# Patient Record
Sex: Female | Born: 1950
Health system: Southern US, Community
[De-identification: ages and names within clinical notes are randomized; demographics above are authoritative.]

## PROBLEM LIST (undated history)

## (undated) DIAGNOSIS — I739 Peripheral vascular disease, unspecified: Secondary | ICD-10-CM

## (undated) DIAGNOSIS — F32A Depression, unspecified: Secondary | ICD-10-CM

## (undated) DIAGNOSIS — G8929 Other chronic pain: Secondary | ICD-10-CM

## (undated) DIAGNOSIS — F1910 Other psychoactive substance abuse, uncomplicated: Secondary | ICD-10-CM

## (undated) DIAGNOSIS — Z8619 Personal history of other infectious and parasitic diseases: Secondary | ICD-10-CM

## (undated) DIAGNOSIS — F410 Panic disorder [episodic paroxysmal anxiety] without agoraphobia: Secondary | ICD-10-CM

## (undated) DIAGNOSIS — N390 Urinary tract infection, site not specified: Secondary | ICD-10-CM

## (undated) DIAGNOSIS — M1990 Unspecified osteoarthritis, unspecified site: Secondary | ICD-10-CM

## (undated) DIAGNOSIS — E119 Type 2 diabetes mellitus without complications: Secondary | ICD-10-CM

## (undated) DIAGNOSIS — F329 Major depressive disorder, single episode, unspecified: Secondary | ICD-10-CM

## (undated) DIAGNOSIS — F419 Anxiety disorder, unspecified: Secondary | ICD-10-CM

## (undated) DIAGNOSIS — I10 Essential (primary) hypertension: Secondary | ICD-10-CM

## (undated) DIAGNOSIS — K759 Inflammatory liver disease, unspecified: Secondary | ICD-10-CM

## (undated) DIAGNOSIS — R7303 Prediabetes: Secondary | ICD-10-CM

## (undated) HISTORY — DX: Urinary tract infection, site not specified: N39.0

## (undated) HISTORY — PX: KNEE SURGERY: SHX244

## (undated) HISTORY — PX: INTRACAPSULAR CATARACT EXTRACTION: SHX361

## (undated) HISTORY — DX: Major depressive disorder, single episode, unspecified: F32.9

## (undated) HISTORY — DX: Unspecified osteoarthritis, unspecified site: M19.90

## (undated) HISTORY — DX: Personal history of other infectious and parasitic diseases: Z86.19

## (undated) HISTORY — DX: Depression, unspecified: F32.A

---

## 1969-07-09 HISTORY — PX: TONSILLECTOMY AND ADENOIDECTOMY: SUR1326

## 1985-07-09 HISTORY — PX: CHOLECYSTECTOMY: SHX55

## 1987-07-10 HISTORY — PX: ABDOMINAL HYSTERECTOMY: SHX81

## 2004-07-25 ENCOUNTER — Ambulatory Visit: Payer: Self-pay

## 2004-10-24 ENCOUNTER — Ambulatory Visit: Payer: Self-pay | Admitting: Family Medicine

## 2005-02-20 ENCOUNTER — Ambulatory Visit: Payer: Self-pay | Admitting: Family Medicine

## 2008-02-14 ENCOUNTER — Emergency Department: Payer: Self-pay | Admitting: Emergency Medicine

## 2008-02-17 ENCOUNTER — Ambulatory Visit: Payer: Self-pay | Admitting: Physician Assistant

## 2008-03-01 ENCOUNTER — Emergency Department: Payer: Self-pay | Admitting: Emergency Medicine

## 2008-03-01 ENCOUNTER — Other Ambulatory Visit: Payer: Self-pay

## 2010-03-22 ENCOUNTER — Ambulatory Visit: Payer: Self-pay | Admitting: Ophthalmology

## 2012-08-25 DIAGNOSIS — F32A Depression, unspecified: Secondary | ICD-10-CM | POA: Insufficient documentation

## 2012-08-25 DIAGNOSIS — F419 Anxiety disorder, unspecified: Secondary | ICD-10-CM | POA: Insufficient documentation

## 2014-10-27 ENCOUNTER — Ambulatory Visit: Payer: Self-pay | Admitting: Nurse Practitioner

## 2015-01-19 ENCOUNTER — Emergency Department
Admission: EM | Admit: 2015-01-19 | Discharge: 2015-01-19 | Disposition: A | Discharge status: Home / Self Care | Payer: Self-pay | Attending: Emergency Medicine | Admitting: Emergency Medicine

## 2015-01-19 ENCOUNTER — Emergency Department: Payer: Self-pay

## 2015-01-19 ENCOUNTER — Encounter: Payer: Self-pay | Admitting: Emergency Medicine

## 2015-01-19 DIAGNOSIS — Z720 Tobacco use: Secondary | ICD-10-CM | POA: Insufficient documentation

## 2015-01-19 DIAGNOSIS — L84 Corns and callosities: Secondary | ICD-10-CM | POA: Insufficient documentation

## 2015-01-19 HISTORY — DX: Other psychoactive substance abuse, uncomplicated: F19.10

## 2015-01-19 HISTORY — DX: Other chronic pain: G89.29

## 2015-01-19 LAB — GLUCOSE, CAPILLARY: Glucose-Capillary: 207 mg/dL — ABNORMAL HIGH (ref 65–99)

## 2015-01-19 MED ORDER — SULFAMETHOXAZOLE-TRIMETHOPRIM 800-160 MG PO TABS
ORAL_TABLET | ORAL | Status: AC
  Administered 2015-01-19: 1 via ORAL
  Filled 2015-01-19: qty 1

## 2015-01-19 MED ORDER — SULFAMETHOXAZOLE-TRIMETHOPRIM 800-160 MG PO TABS: 1.0000 | ORAL_TABLET | Freq: Two times a day (BID) | ORAL | Status: DC

## 2015-01-19 MED ORDER — TRAMADOL HCL 50 MG PO TABS: 50.0000 mg | ORAL_TABLET | Freq: Four times a day (QID) | ORAL | Status: DC | PRN

## 2015-01-19 MED ORDER — OXYCODONE-ACETAMINOPHEN 5-325 MG PO TABS
2.0000 | ORAL_TABLET | Freq: Once | ORAL | Status: AC
  Administered 2015-01-19: 2 via ORAL

## 2015-01-19 MED ORDER — SULFAMETHOXAZOLE-TRIMETHOPRIM 800-160 MG PO TABS
1.0000 | ORAL_TABLET | Freq: Once | ORAL | Status: AC
  Administered 2015-01-19: 1 via ORAL

## 2015-01-19 MED ORDER — OXYCODONE-ACETAMINOPHEN 5-325 MG PO TABS
ORAL_TABLET | ORAL | Status: AC
  Administered 2015-01-19: 2 via ORAL
  Filled 2015-01-19: qty 2

## 2015-01-19 NOTE — ED Notes (Signed)
Pt states numbness to her left foot for 1 month, pt has large blister on her left big toe

## 2015-01-19 NOTE — Discharge Instructions (Signed)
Corns and Calluses Corns are small areas of thickened skin that usually occur on the top, sides, or tip of a toe. They contain a cone-shaped core with a point that can press on a nerve below. This causes pain. Calluses are areas of thickened skin that usually develop on hands, fingers, palms, soles of the feet, and heels. These are areas that experience frequent friction or pressure. CAUSES  Corns are usually the result of rubbing (friction) or pressure from shoes that are too tight or do not fit properly. Calluses are caused by repeated friction and pressure on the affected areas. SYMPTOMS  A hard growth on the skin.  Pain or tenderness under the skin.  Sometimes, redness and swelling.  Increased discomfort while wearing tight-fitting shoes. DIAGNOSIS  Your caregiver can usually tell what the problem is by doing a physical exam. TREATMENT  Removing the cause of the friction or pressure is usually the only treatment needed. However, sometimes medicines can be used to help soften the hardened, thickened areas. These medicines include salicylic acid plasters and 12% ammonium lactate lotion. These medicines should only be used under the direction of your caregiver. HOME CARE INSTRUCTIONS   Try to remove pressure from the affected area.  You may wear donut-shaped corn pads to protect your skin.  You may use a pumice stone or nonmetallic nail file to gently reduce the thickness of a corn.  Wear properly fitted footwear.  If you have calluses on the hands, wear gloves during activities that cause friction.  If you have diabetes, you should regularly examine your feet. Tell your caregiver if you notice any problems with your feet. SEEK IMMEDIATE MEDICAL CARE IF:   You have increased pain, swelling, redness, or warmth in the affected area.  Your corn or callus starts to drain fluid or bleeds.  You are not getting better, even with treatment. Document Released: 03/31/2004 Document  Revised: 09/17/2011 Document Reviewed: 02/20/2011 [facility] Patient Information 2015 [place], [place]. This information is not intended to replace advice given to you by your health care provider. Make sure you discuss any questions you have with your health care provider. Cellulitis Cellulitis is an infection of the skin and the tissue beneath it. The infected area is usually red and tender. Cellulitis occurs most often in the arms and lower legs.  CAUSES  Cellulitis is caused by bacteria that enter the skin through cracks or cuts in the skin. The most common types of bacteria that cause cellulitis are staphylococci and streptococci. SIGNS AND SYMPTOMS   Redness and warmth.  Swelling.  Tenderness or pain.  Fever. DIAGNOSIS  Your health care provider can usually determine what is wrong based on a physical exam. Blood tests may also be done. TREATMENT  Treatment usually involves taking an antibiotic medicine. HOME CARE INSTRUCTIONS   Take your antibiotic medicine as directed by your health care provider. Finish the antibiotic even if you start to feel better.  Keep the infected arm or leg elevated to reduce swelling.  Apply a warm cloth to the affected area up to 4 times per day to relieve pain.  Take medicines only as directed by your health care provider.  Keep all follow-up visits as directed by your health care provider. SEEK MEDICAL CARE IF:   You notice red streaks coming from the infected area.  Your red area gets larger or turns dark in color.  Your bone or joint underneath the infected area becomes painful after the skin has healed.  Your  infection returns in the same area or another area.  You notice a swollen bump in the infected area.  You develop new symptoms.  You have a fever. SEEK IMMEDIATE MEDICAL CARE IF:   You feel very sleepy.  You develop vomiting or diarrhea.  You have a general ill feeling (malaise) with muscle aches and pains. MAKE SURE YOU:     Understand these instructions.  Will watch your condition.  Will get help right away if you are not doing well or get worse. Document Released: 04/04/2005 Document Revised: 11/09/2013 Document Reviewed: 09/10/2011 [facility] Patient Information 2015 [place], [place]. This information is not intended to replace advice given to you by your health care provider. Make sure you discuss any questions you have with your health care provider.  Specify anatomic site  Laterality: --Right --Left --Bilateral  Cellulitis of other parts of limb, specify: --Right --Left --Upper --Lower  Cellulitis of the cheek, specify: --Internal --External  Document any associated diagnoses/conditions  Supporting Information:   Thank You,

## 2015-01-19 NOTE — ED Notes (Signed)
Pt presents with left foot pain and blisters with some numbness going on for about one year. Pt does not know if she is diabetic or not.

## 2015-01-19 NOTE — ED Provider Notes (Addendum)
[facility] Emergency Department Provider Note     Time seen: ----------------------------------------- 4:14 PM on 01/19/2015 -----------------------------------------    I have reviewed the triage vital signs and the nursing notes.   HISTORY  Chief Complaint Numbness    HPI Yesenia Shepard is a 64 y.o. female who presents ER for numbness to her left foot for a month, Main complaint however is a large blister to the medial and inferior aspect of her left great toe. Patient does report some pain there that may be due to the numbness that she's been experiencing. Pain is mild to moderate worse with ambulation.   Past Medical History  Diagnosis Date  . Chronic pain   . Drug abuse     There are no active problems to display for this patient.   Past Surgical History  Procedure Laterality Date  . Abdominal surgery    . Cholecystectomy    . Tonsillectomy    . Abdominal hysterectomy      Allergies Codeine  Social History History  Substance Use Topics  . Smoking status: Current Some Day Smoker  . Smokeless tobacco: Not on file  . Alcohol Use: No    Review of Systems Constitutional: Negative for fever. Eyes: Negative for visual changes. ENT: Negative for sore throat. Cardiovascular: Negative for chest pain. Respiratory: Negative for shortness of breath. Gastrointestinal: Negative for abdominal pain, vomiting and diarrhea. Musculoskeletal: Negative for back pain. Skin: Negative for rash. Neurological: Negative for headaches, positive for paresthesias in bilateral feet  10-point ROS otherwise negative.  ____________________________________________   PHYSICAL EXAM:  VITAL SIGNS: ED Triage Vitals  Enc Vitals Group     BP 01/19/15 1444 151/94 mmHg     Pulse Rate 01/19/15 1444 81     Resp 01/19/15 1444 18     Temp 01/19/15 1444 98.6 F (37 C)     Temp Source 01/19/15 1444 Oral     SpO2 01/19/15 1444 93 %     Weight 01/19/15 1444 160  lb (72.576 kg)     Height 01/19/15 1444 5\' 6"  (1.676 m)     Head Cir --      Peak Flow --      Pain Score 01/19/15 1444 10     Pain Loc --      Pain Edu? --      Excl. in [place]? --     Constitutional: Alert and oriented. Well appearing and in no distress. Eyes: Conjunctivae are normal. PERRL. Normal extraocular movements. Musculoskeletal: Nontender with normal range of motion in all extremities. No joint effusions.  No lower extremity tenderness nor edema. See toe exam is dictated under skin. Neurologic:  Normal speech and language. No gross focal neurologic deficits are appreciated. Speech is normal. No gait instability. Skin:  It appears to be corn formation on the base of the left great toe as well as surrounding contusion and possibly infection. Area is tender to touch, normal range of motion. Psychiatric: Mood and affect are normal. Speech and behavior are normal. Patient exhibits appropriate insight and judgment.   ____________________________________________  ED COURSE:  Pertinent labs & imaging results that were available during my care of the patient were reviewed by me and considered in my medical decision making (see chart for details). Patient will need imaging of the left great toe, will get a fingerstick. ____________________________________________   RADIOLOGY Images were viewed by me  Left great toe x-rays unremarkable   Blood sugar was 207, should be advised  for repeat of this by her primary care doctor. ____________________________________________  FINAL ASSESSMENT AND PLAN  Callus formation and cellulitis  Plan: Patient with extensive callous formation the base of the left great toe, there does appear to be some infection present. She'll be discharged with Septra as well as pain medication and podiatry referral.   Earleen Newport, MD   Earleen Newport, MD 01/19/15 [place], MD 01/19/15 [phone]

## 2015-01-31 ENCOUNTER — Ambulatory Visit: Payer: Self-pay | Admitting: Nurse Practitioner

## 2015-01-31 DIAGNOSIS — Z0289 Encounter for other administrative examinations: Secondary | ICD-10-CM

## 2015-02-04 LAB — CBC AND DIFFERENTIAL
HCT: 41 % (ref 36–46)
Hemoglobin: 13.7 g/dL (ref 12.0–16.0)
Neutrophils Absolute: 4 /uL
Platelets: 244 10*3/uL (ref 150–399)
WBC: 6.4 10^3/mL

## 2015-02-04 LAB — BASIC METABOLIC PANEL
BUN: 10 mg/dL (ref 4–21)
Creatinine: 0.6 mg/dL (ref 0.5–1.1)
Glucose: 155 mg/dL
Potassium: 4.6 mmol/L (ref 3.4–5.3)
Sodium: 139 mmol/L (ref 137–147)

## 2015-02-04 LAB — HEPATIC FUNCTION PANEL
ALT: 21 U/L (ref 7–35)
AST: 23 U/L (ref 13–35)
Alkaline Phosphatase: 65 U/L (ref 25–125)
Bilirubin, Total: 0.4 mg/dL

## 2015-02-04 LAB — LIPID PANEL
Cholesterol: 158 mg/dL (ref 0–200)
HDL: 42 mg/dL (ref 35–70)
LDL Cholesterol: 96 mg/dL
Triglycerides: 99 mg/dL (ref 40–160)

## 2015-02-04 LAB — TSH: TSH: 0.5 u[IU]/mL (ref 0.41–5.90)

## 2015-03-15 ENCOUNTER — Encounter: Payer: Self-pay | Admitting: Family Medicine

## 2015-03-15 ENCOUNTER — Ambulatory Visit (INDEPENDENT_AMBULATORY_CARE_PROVIDER_SITE_OTHER): Payer: Self-pay | Admitting: Family Medicine

## 2015-03-15 VITALS — BP 124/80 | HR 70 | Temp 98.1°F | Ht 65.5 in | Wt 151.4 lb

## 2015-03-15 DIAGNOSIS — R739 Hyperglycemia, unspecified: Secondary | ICD-10-CM

## 2015-03-15 DIAGNOSIS — R7309 Other abnormal glucose: Secondary | ICD-10-CM

## 2015-03-15 DIAGNOSIS — L97921 Non-pressure chronic ulcer of unspecified part of left lower leg limited to breakdown of skin: Secondary | ICD-10-CM | POA: Insufficient documentation

## 2015-03-15 DIAGNOSIS — F1991 Other psychoactive substance use, unspecified, in remission: Secondary | ICD-10-CM | POA: Insufficient documentation

## 2015-03-15 DIAGNOSIS — M1990 Unspecified osteoarthritis, unspecified site: Secondary | ICD-10-CM | POA: Insufficient documentation

## 2015-03-15 DIAGNOSIS — S81802D Unspecified open wound, left lower leg, subsequent encounter: Secondary | ICD-10-CM

## 2015-03-15 DIAGNOSIS — Z87898 Personal history of other specified conditions: Secondary | ICD-10-CM

## 2015-03-15 DIAGNOSIS — Z0000 Encounter for general adult medical examination without abnormal findings: Secondary | ICD-10-CM

## 2015-03-15 DIAGNOSIS — F1920 Other psychoactive substance dependence, uncomplicated: Secondary | ICD-10-CM

## 2015-03-15 DIAGNOSIS — Z13220 Encounter for screening for lipoid disorders: Secondary | ICD-10-CM

## 2015-03-15 DIAGNOSIS — F17200 Nicotine dependence, unspecified, uncomplicated: Secondary | ICD-10-CM | POA: Insufficient documentation

## 2015-03-15 NOTE — Assessment & Plan Note (Signed)
Patient currently on Xanax, methadone, and Klonopin. Given history of abuse I will not be prescribing these medications for her as she is at high risk and current guidelines do not advised simultaneous use of these meds.

## 2015-03-15 NOTE — Progress Notes (Signed)
Subjective:  Patient ID: Yesenia Shepard, female    DOB: 12-01-1950  Age: 64 y.o. MRN: [id number]  CC: Establish care  HPI Yesenia Shepard is a 64 y.o. female presents to the clinic today to establish care.  Preventative Healthcare  Pap smear: Last year  Mammogram/Breast exam: Approximately 6 months ago.  Colonoscopy: Patient has never had a colonoscopy. Patient declined today. She states that she is waiting on "my Medicare".  Immunizations: Patient in need of tetanus and pneumococcal vaccinations. Patient declined today.  Labs: Patient had recent labs done by her GYN (CBC, CMP, TSH, vitamin D). Patient's concern about diabetes given her family history and reported prior abnormal blood glucose levels. Patient in need of A1c and lipid panel today.  Alcohol use: No. Hx of drug abuse.  Smoking/tobacco use: Current everyday smoker.   Regular dental exams: No.  2) Left foot wound  Patient reports she has had a wound of her left great toe for approximately one year.  She has seen podiatry for this and states that she has scheduled follow-up.  She states that the area is slowly healing.  No recent fevers or chills. No current drainage from the area.  PMH, Surgical Hx, Family Hx, Social History reviewed and updated as below. Past Medical History  Diagnosis Date  . Chronic pain   . Drug abuse     History of polysubstance abuse; currently on methadone.  . Depression   . History of chicken pox   . Arthritis   . UTI (urinary tract infection)     History of    Past Surgical History  Procedure Laterality Date  . Cholecystectomy  1987  . Abdominal hysterectomy  1989  . Tonsillectomy and adenoidectomy  1971    Family History  Problem Relation Age of Onset  . Arthritis Mother   . Mental illness Mother     depression  . Diabetes Mother   . Cancer Father     colon  . Heart disease Father   . Diabetes Father     Social History  Substance Use Topics  . Smoking status:  Current Some Day Smoker -- 1.00 packs/day for 30 years    Types: Cigarettes  . Smokeless tobacco: Never Used     Comment: Currently smoking 1/2 ppd  . Alcohol Use: No    Review of Systems  Constitutional: Negative for fever and chills.  HENT: Negative.   Eyes: Negative for pain and visual disturbance.  Respiratory: Negative for cough and shortness of breath.   Cardiovascular: Positive for leg swelling. Negative for chest pain.  Gastrointestinal: Positive for constipation. Negative for nausea, vomiting, abdominal pain and diarrhea.  Endocrine: Positive for polydipsia.  Genitourinary:       Urinary incontinence.  Musculoskeletal: Negative.   Skin: Positive for wound.  Neurological:       Numbness of feet.  Psychiatric/Behavioral:       + for sadness, anxiety, stress    Objective:   Today's Vitals: BP 124/80 mmHg  Pulse 70  Temp(Src) 98.1 F (36.7 C) (Oral)  Ht 5' 5.5" (1.664 m)  Wt 151 lb 6 oz (68.663 kg)  BMI 24.80 kg/m2  SpO2 94%  Physical Exam  Constitutional: She is oriented to person, place, and time.  Chronically ill-appearing female; appears fatigued; no acute distress.  HENT:  Head: Normocephalic and atraumatic.  Mouth/Throat: No oropharyngeal exudate.  Normal TMs bilaterally. Mouth - poor dentition noted with several caries of the lower teeth. Upper dentures  noted.  Neck: Neck supple.  Cardiovascular: Normal rate and regular rhythm.   Unable to palpate dorsalis pedis or posterior tibial pulses bilaterally.  Pulmonary/Chest: Effort normal and breath sounds normal. No respiratory distress. She has no wheezes. She has no rales.  Abdominal: Soft. She exhibits no distension. There is no tenderness. There is no rebound.  Lymphadenopathy:    She has no cervical adenopathy.  Neurological: She is alert and oriented to person, place, and time.  Skin:  Small hyperpigmented area noted of the left great toe. Wound appears to be healing. No active drainage, redness.    Psychiatric:  Depressed affect. Patient tearful during history   Assessment & Plan:   Problem List Items Addressed This Visit    History of recreational drug use    Patient currently on Xanax, methadone, and Klonopin. Given history of abuse I will not be prescribing these medications for her as she is at high risk and current guidelines do not advised simultaneous use of these meds.      Preventative health care - Primary    Pap smear and mammogram up-to-date. Patient declined immunizations as well as colonoscopy today. Patient not interested in smoking cessation at this time due to recent stress. Obtaining A1c and lipid panel today.      Wound of left lower extremity    Given prior elevated blood sugars and reports numbness I suspect patient has underlying diabetes with subsequent neuropathy. Wound appears to be healing. However, I was unable to palpate pulses bilaterally. Patient needs ABIs to rule out peripheral vascular disease. Awaiting labs regarding lipids and A1c.       Other Visit Diagnoses    Screening for lipid disorders        Relevant Orders    Lipid panel    Elevated blood sugar        Relevant Orders    Hemoglobin A1c       Outpatient Encounter Prescriptions as of 03/15/2015  Medication Sig  . ALPRAZolam (XANAX) 1 MG tablet Take 2 mg by mouth.  . clonazePAM (KLONOPIN) 1 MG tablet Take 1 mg by mouth 2 (two) times daily.  . ergocalciferol (DRISDOL) 50000 UNITS capsule Take 50,000 Units by mouth once a week.  . traMADol (ULTRAM) 50 MG tablet Take 100 mg by mouth at bedtime.   . [DISCONTINUED] traMADol (ULTRAM) 50 MG tablet Take 1 tablet (50 mg total) by mouth every 6 (six) hours as needed.  . [DISCONTINUED] sulfamethoxazole-trimethoprim (BACTRIM DS) 800-160 MG per tablet Take 1 tablet by mouth 2 (two) times daily. (Patient not taking: Reported on 03/15/2015)   No facility-administered encounter medications on file as of 03/15/2015.    Follow-up: Following Lab  results  Coral Spikes DO

## 2015-03-15 NOTE — Assessment & Plan Note (Signed)
Given prior elevated blood sugars and reports numbness I suspect patient has underlying diabetes with subsequent neuropathy. Wound appears to be healing. However, I was unable to palpate pulses bilaterally. Patient needs ABIs to rule out peripheral vascular disease. Awaiting labs regarding lipids and A1c.

## 2015-03-15 NOTE — Assessment & Plan Note (Signed)
Pap smear and mammogram up-to-date. Patient declined immunizations as well as colonoscopy today. Patient not interested in smoking cessation at this time due to recent stress. Obtaining A1c and lipid panel today.

## 2015-03-15 NOTE — Patient Instructions (Signed)
It was nice to see you today.  We will call with your lab results.  We will have you follow up after that.  Take care  Dr. Lacinda Axon

## 2015-03-16 NOTE — Addendum Note (Signed)
Addended by: Coral Spikes on: 03/16/2015 10:30 AM   Modules accepted: Orders

## 2015-04-04 ENCOUNTER — Telehealth: Payer: Self-pay | Admitting: Family Medicine

## 2015-04-04 NOTE — Telephone Encounter (Signed)
I do not have her records from the [facility]. I also do not have her labs from her prior physician and cannot send her to endo until I receive them.

## 2015-04-04 NOTE — Telephone Encounter (Signed)
Pt called wanting to get lab results that was done on 03/25/2015 that was done at [facility]. Pt also wants to get a referral to the Encrinologist as quickly as possible. Thank You!

## 2015-04-04 NOTE — Telephone Encounter (Signed)
Please advise 

## 2015-04-05 NOTE — Telephone Encounter (Signed)
Pt stated she hasn't seen a PCP, but will call her OBGYN

## 2015-04-07 ENCOUNTER — Telehealth: Payer: Self-pay | Admitting: Family Medicine

## 2015-04-07 ENCOUNTER — Telehealth: Payer: Self-pay

## 2015-04-07 NOTE — Telephone Encounter (Signed)
Pt called about calling [facility] someone told her that pt needs to call us back and we need to tell the pt the next step. Thank you!

## 2015-04-07 NOTE — Telephone Encounter (Signed)
Pt was told of the referral.

## 2015-04-07 NOTE — Telephone Encounter (Signed)
Called her and l/v to let her know we have the results. i stated i has given them to [place] to look over and was waiting for feedback so i was able to call her with those results.

## 2015-04-07 NOTE — Telephone Encounter (Signed)
She has not gotten her test results from [facility] and is upset and nervous. She stated that [facility] told her that the results were faxed to the office twice and she has called 3 three times and at one point was on hold for 10 minutes and was then hung up on.

## 2015-04-08 ENCOUNTER — Other Ambulatory Visit: Payer: Self-pay | Admitting: Family Medicine

## 2015-04-08 ENCOUNTER — Telehealth: Payer: Self-pay

## 2015-04-08 DIAGNOSIS — I739 Peripheral vascular disease, unspecified: Secondary | ICD-10-CM

## 2015-04-08 NOTE — Telephone Encounter (Signed)
Pt is worried and was wondering what was going on.

## 2015-04-08 NOTE — Telephone Encounter (Signed)
Stating to pt that we do not have her labs that will show [place] if she has diabetes or not. She is going to go by the doctors office she had her blood work done by and have them fax it to [place]. i told her once we have it we will call her back. Also stated the we have sent out a referral for [facility] and she would be contacted with appt.

## 2015-04-14 ENCOUNTER — Telehealth: Payer: Self-pay

## 2015-04-14 NOTE — Telephone Encounter (Signed)
I have not received any records following recent ABIs.

## 2015-04-14 NOTE — Telephone Encounter (Signed)
Patient called requesting test results from [facility] results, Please advise?

## 2015-04-15 ENCOUNTER — Telehealth: Payer: Self-pay | Admitting: Family Medicine

## 2015-04-15 NOTE — Telephone Encounter (Signed)
i have talked with her i think we have them but due to him not being here he hasnt had a chance to look at them. i will double check to make sure we have them for sure, but told her id call her Monday.

## 2015-04-15 NOTE — Telephone Encounter (Signed)
Called to let her know Dr. Lacinda Axon has not had a chance to look at her records.

## 2015-04-15 NOTE — Telephone Encounter (Signed)
Patient called asking if we received any record form Dr. Sammuel Bailiff office at [facility] regarding her foot . She stated she is very worried that no one is following up on her care. I left a message with Dr. Sammuel Bailiff office to forward records . They will expedite sending the records when the medical records person returns on Monday her name is Ronny Bacon. They have 7-10 business days to send those records.

## 2015-04-18 ENCOUNTER — Telehealth: Payer: Self-pay

## 2015-04-18 ENCOUNTER — Other Ambulatory Visit: Payer: Self-pay | Admitting: Family Medicine

## 2015-04-18 DIAGNOSIS — R7303 Prediabetes: Secondary | ICD-10-CM

## 2015-04-18 DIAGNOSIS — Z130 Encounter for screening for diseases of the blood and blood-forming organs and certain disorders involving the immune mechanism: Secondary | ICD-10-CM

## 2015-04-18 DIAGNOSIS — Z13220 Encounter for screening for lipoid disorders: Secondary | ICD-10-CM

## 2015-04-18 NOTE — Telephone Encounter (Signed)
Patient called the triage line regarding getting test results and that her toe is black and leg is still numb.  Called twice at 2pm and 238pm.  Reviewed with Hollie Beach, she is going to call the patient back.

## 2015-04-18 NOTE — Telephone Encounter (Signed)
Pt was told we would schedule labs so that we were not waiting for them to be sent and she'd have her results in three days. i will schedule once you tell me fasting or non fasting labs. She also wants to be seen so you can look at the placve on her foot.

## 2015-04-19 ENCOUNTER — Encounter: Payer: Self-pay | Admitting: Family Medicine

## 2015-04-19 ENCOUNTER — Ambulatory Visit (INDEPENDENT_AMBULATORY_CARE_PROVIDER_SITE_OTHER): Payer: Self-pay | Admitting: Family Medicine

## 2015-04-19 VITALS — BP 116/72 | HR 66 | Temp 97.9°F | Ht 65.5 in | Wt 152.1 lb

## 2015-04-19 DIAGNOSIS — Z13220 Encounter for screening for lipoid disorders: Secondary | ICD-10-CM

## 2015-04-19 DIAGNOSIS — R7303 Prediabetes: Secondary | ICD-10-CM

## 2015-04-19 DIAGNOSIS — Z130 Encounter for screening for diseases of the blood and blood-forming organs and certain disorders involving the immune mechanism: Secondary | ICD-10-CM

## 2015-04-19 DIAGNOSIS — E1169 Type 2 diabetes mellitus with other specified complication: Secondary | ICD-10-CM | POA: Insufficient documentation

## 2015-04-19 DIAGNOSIS — S81802D Unspecified open wound, left lower leg, subsequent encounter: Secondary | ICD-10-CM

## 2015-04-19 DIAGNOSIS — E119 Type 2 diabetes mellitus without complications: Secondary | ICD-10-CM | POA: Insufficient documentation

## 2015-04-19 LAB — CBC
HCT: 40.7 % (ref 36.0–46.0)
Hemoglobin: 13.4 g/dL (ref 12.0–15.0)
MCHC: 33 g/dL (ref 30.0–36.0)
MCV: 91.3 fl (ref 78.0–100.0)
Platelets: 250 10*3/uL (ref 150.0–400.0)
RBC: 4.46 Mil/uL (ref 3.87–5.11)
RDW: 13.2 % (ref 11.5–15.5)
WBC: 8.1 10*3/uL (ref 4.0–10.5)

## 2015-04-19 LAB — HEMOGLOBIN A1C: Hgb A1c MFr Bld: 6.2 % (ref 4.6–6.5)

## 2015-04-19 NOTE — Progress Notes (Signed)
Pre visit review using our clinic review tool, if applicable. No additional management support is needed unless otherwise documented below in the visit note. 

## 2015-04-19 NOTE — Progress Notes (Signed)
   Subjective:  Patient ID: Yesenia Shepard, female    DOB: Adalea 17, 1952  Age: 64 y.o. MRN: [id number]  CC: Foot ulcer/lesion  HPI:  64 year old female smoker presents to the clinic today for re-evaluation regarding her left great toe lesion.  1) Left foot wound  Has been present for greater than a year.  I initially saw her on 9/6. Given her history and diminished pulses and sent her for ABIs. ABIs returned abnormal at 0.71 and 0.77.  She presents today for follow-up regarding this.  She reports that the area appears "worse".  No reported fever, chills. No redness. It is slightly painful.  No relieving factors.  She is concerned about the fact that it has yet to heal.  Social Hx   Social History   Social History  . Marital Status: Legally Separated    Spouse Name: N/A  . Number of Children: N/A  . Years of Education: N/A   Social History Main Topics  . Smoking status: Current Some Day Smoker -- 1.00 packs/day for 30 years    Types: Cigarettes  . Smokeless tobacco: Never Used     Comment: Currently smoking 1/2 ppd  . Alcohol Use: No  . Drug Use: No     Comment: Hx of.  . Sexual Activity: Not Currently   Other Topics Concern  . None   Social History Narrative   Review of Systems  Constitutional: Negative.   Skin: Positive for wound.   Objective:  BP 116/72 mmHg  Pulse 66  Temp(Src) 97.9 F (36.6 C) (Oral)  Ht 5' 5.5" (1.664 m)  Wt 152 lb 2 oz (69.003 kg)  BMI 24.92 kg/m2  SpO2 93%  BP/Weight 04/19/2015 03/15/2015 [id number]  Systolic BP 99991111 [id number]  Diastolic BP 72 80 94  Wt. (Lbs) 152.13 151.38 160  BMI 24.92 24.8 25.84   Physical Exam  Constitutional:  Chronically ill appearing; NAD.   Cardiovascular: Normal rate and regular rhythm.   Diminished DP pulses.   Pulmonary/Chest: Effort normal and breath sounds normal. No respiratory distress. She has no wheezes. She has no rales.  Neurological: She is alert.  Skin:  Left great toe -  Small ulceration  with underlying hemorrhagic appearance and hyperkeratosis.   Psychiatric:  Depressed mood and affect.   Vitals reviewed.  Assessment & Plan:   Problem List Items Addressed This Visit    Wound of left lower extremity    Wound does not appear to be infected. Given abnormal ABIs placing referral to Vascular for evaluation (appt 10/24).      Prediabetes - Primary    Other Visit Diagnoses    Screening for deficiency anemia        Screening for lipid disorders          Follow-up: Following evaluation by Vascular; Patient in need of health insurance so that preventative care items can be addressed.   Thersa Salt, DO

## 2015-04-19 NOTE — Assessment & Plan Note (Signed)
Wound does not appear to be infected. Given abnormal ABIs placing referral to Vascular for evaluation (appt 10/24).

## 2015-04-19 NOTE — Patient Instructions (Addendum)
The lesion on your foot does not look infected at this time.  Continue to wear good supportive shoes and check your feet daily.  The most important thing you can do for your health is to stop smoking.  Given your previous abnormal study, we need to have you evaluated formally by vascular.  Follow up with [place] after that visit.  Take care  Dr. Lacinda Axon

## 2015-04-20 LAB — LIPID PANEL
Cholesterol: 159 mg/dL (ref 0–200)
HDL: 44.4 mg/dL (ref >39.00)
LDL Cholesterol: 100 mg/dL — ABNORMAL HIGH (ref 0–99)
NonHDL: 114.32
Total CHOL/HDL Ratio: 4
Triglycerides: 74 mg/dL (ref 0.0–149.0)
VLDL: 14.8 mg/dL (ref 0.0–40.0)

## 2015-04-20 LAB — COMPREHENSIVE METABOLIC PANEL
ALT: 16 U/L (ref 0–35)
AST: 19 U/L (ref 0–37)
Albumin: 3.5 g/dL (ref 3.5–5.2)
Alkaline Phosphatase: 68 U/L (ref 39–117)
BUN: 14 mg/dL (ref 6–23)
CO2: 34 mEq/L — ABNORMAL HIGH (ref 19–32)
Calcium: 9.1 mg/dL (ref 8.4–10.5)
Chloride: 100 mEq/L (ref 96–112)
Creatinine, Ser: 0.84 mg/dL (ref 0.40–1.20)
GFR: 72.37 mL/min (ref >60.00)
Glucose, Bld: 153 mg/dL — ABNORMAL HIGH (ref 70–99)
Potassium: 4.4 mEq/L (ref 3.5–5.1)
Sodium: 138 mEq/L (ref 135–145)
Total Bilirubin: 0.4 mg/dL (ref 0.2–1.2)
Total Protein: 6.4 g/dL (ref 6.0–8.3)

## 2015-04-21 ENCOUNTER — Other Ambulatory Visit: Payer: Self-pay | Admitting: Family Medicine

## 2015-04-21 MED ORDER — ATORVASTATIN CALCIUM 40 MG PO TABS
40.0000 mg | ORAL_TABLET | Freq: Every day | ORAL | Status: DC
Start: 2015-04-21 — End: 2016-06-06

## 2015-04-28 ENCOUNTER — Encounter: Payer: Self-pay | Admitting: Family Medicine

## 2015-05-02 ENCOUNTER — Other Ambulatory Visit: Payer: Self-pay | Admitting: Vascular Surgery

## 2015-05-03 ENCOUNTER — Other Ambulatory Visit
Admission: RE | Admit: 2015-05-03 | Discharge: 2015-05-03 | Disposition: A | Discharge status: Home / Self Care | Payer: Self-pay | Source: Ambulatory Visit | Attending: Vascular Surgery | Admitting: Vascular Surgery

## 2015-05-03 DIAGNOSIS — Z13220 Encounter for screening for lipoid disorders: Secondary | ICD-10-CM | POA: Insufficient documentation

## 2015-05-03 LAB — LIPID PANEL
Cholesterol: 113 mg/dL (ref 0–200)
HDL: 47 mg/dL (ref >40)
LDL Cholesterol: 57 mg/dL (ref 0–99)
Total CHOL/HDL Ratio: 2.4 RATIO
Triglycerides: 45 mg/dL (ref <150)
VLDL: 9 mg/dL (ref 0–40)

## 2015-05-03 LAB — HEMOGLOBIN A1C: Hgb A1c MFr Bld: 6.5 % — ABNORMAL HIGH (ref 4.0–6.0)

## 2015-05-04 ENCOUNTER — Encounter
Admission: RE | Disposition: A | Discharge status: Home / Self Care | Payer: Self-pay | Source: Ambulatory Visit | Attending: Vascular Surgery

## 2015-05-04 ENCOUNTER — Ambulatory Visit
Admission: RE | Admit: 2015-05-04 | Discharge: 2015-05-04 | Disposition: A | Discharge status: Home / Self Care | Payer: Self-pay | Source: Ambulatory Visit | Attending: Vascular Surgery | Admitting: Vascular Surgery

## 2015-05-04 DIAGNOSIS — I70245 Atherosclerosis of native arteries of left leg with ulceration of other part of foot: Secondary | ICD-10-CM | POA: Insufficient documentation

## 2015-05-04 DIAGNOSIS — L97529 Non-pressure chronic ulcer of other part of left foot with unspecified severity: Secondary | ICD-10-CM | POA: Insufficient documentation

## 2015-05-04 DIAGNOSIS — F17200 Nicotine dependence, unspecified, uncomplicated: Secondary | ICD-10-CM | POA: Insufficient documentation

## 2015-05-04 DIAGNOSIS — I890 Lymphedema, not elsewhere classified: Secondary | ICD-10-CM | POA: Insufficient documentation

## 2015-05-04 DIAGNOSIS — E119 Type 2 diabetes mellitus without complications: Secondary | ICD-10-CM | POA: Insufficient documentation

## 2015-05-04 DIAGNOSIS — E785 Hyperlipidemia, unspecified: Secondary | ICD-10-CM | POA: Insufficient documentation

## 2015-05-04 HISTORY — PX: PERIPHERAL VASCULAR CATHETERIZATION: SHX172C

## 2015-05-04 LAB — BUN: BUN: 10 mg/dL (ref 6–20)

## 2015-05-04 LAB — CREATININE, SERUM
Creatinine, Ser: 0.67 mg/dL (ref 0.44–1.00)
GFR calc Af Amer: >60 mL/min (ref >60)
GFR calc non Af Amer: >60 mL/min (ref >60)

## 2015-05-04 SURGERY — LOWER EXTREMITY ANGIOGRAPHY
Anesthesia: Moderate Sedation | Laterality: Left

## 2015-05-04 MED ORDER — FENTANYL CITRATE (PF) 100 MCG/2ML IJ SOLN
INTRAMUSCULAR | Status: AC
  Filled 2015-05-04: qty 2

## 2015-05-04 MED ORDER — CLOPIDOGREL BISULFATE 75 MG PO TABS
300.0000 mg | ORAL_TABLET | Freq: Once | ORAL | Status: AC
  Administered 2015-05-04: 300 mg via ORAL

## 2015-05-04 MED ORDER — DEXTROSE 5 % IV SOLN: 1.5000 g | INTRAVENOUS | Status: DC

## 2015-05-04 MED ORDER — HEPARIN SODIUM (PORCINE) 1000 UNIT/ML IJ SOLN
INTRAMUSCULAR | Status: AC
  Filled 2015-05-04: qty 1

## 2015-05-04 MED ORDER — DEXTROSE 5 % IV SOLN
INTRAVENOUS | Status: AC
  Administered 2015-05-04: 14:00:00
  Filled 2015-05-04: qty 1.5

## 2015-05-04 MED ORDER — IOHEXOL 300 MG/ML  SOLN
INTRAMUSCULAR | Status: DC | PRN
  Administered 2015-05-04: 65 mL via INTRA_ARTERIAL

## 2015-05-04 MED ORDER — CLOPIDOGREL BISULFATE 75 MG PO TABS
ORAL_TABLET | ORAL | Status: AC
  Filled 2015-05-04: qty 4

## 2015-05-04 MED ORDER — DIPHENHYDRAMINE HCL 50 MG/ML IJ SOLN
INTRAMUSCULAR | Status: DC | PRN
  Administered 2015-05-04: 50 mg via INTRAVENOUS

## 2015-05-04 MED ORDER — CHLORHEXIDINE GLUCONATE CLOTH 2 % EX PADS: 6.0000 | MEDICATED_PAD | Freq: Once | CUTANEOUS | Status: DC

## 2015-05-04 MED ORDER — MIDAZOLAM HCL 5 MG/5ML IJ SOLN
INTRAMUSCULAR | Status: AC
  Filled 2015-05-04: qty 5

## 2015-05-04 MED ORDER — LIDOCAINE HCL (PF) 1 % IJ SOLN
INTRAMUSCULAR | Status: AC
  Filled 2015-05-04: qty 10

## 2015-05-04 MED ORDER — SODIUM CHLORIDE 0.9 % IV SOLN
INTRAVENOUS | Status: DC
  Administered 2015-05-04 (×2): via INTRAVENOUS

## 2015-05-04 MED ORDER — HEPARIN (PORCINE) IN NACL 2-0.9 UNIT/ML-% IJ SOLN
INTRAMUSCULAR | Status: AC
  Filled 2015-05-04: qty 1000

## 2015-05-04 MED ORDER — DIPHENHYDRAMINE HCL 50 MG/ML IJ SOLN
INTRAMUSCULAR | Status: AC
  Filled 2015-05-04: qty 1

## 2015-05-04 MED ORDER — MIDAZOLAM HCL 2 MG/2ML IJ SOLN
INTRAMUSCULAR | Status: DC | PRN
  Administered 2015-05-04: 3 mg via INTRAVENOUS
  Administered 2015-05-04: 1 mg via INTRAVENOUS
  Administered 2015-05-04: 2 mg via INTRAVENOUS
  Administered 2015-05-04: 1 mg via INTRAVENOUS

## 2015-05-04 MED ORDER — HEPARIN SODIUM (PORCINE) 1000 UNIT/ML IJ SOLN
INTRAMUSCULAR | Status: DC | PRN
  Administered 2015-05-04: 4000 [IU] via INTRAVENOUS

## 2015-05-04 MED ORDER — CEFAZOLIN SODIUM 1-5 GM-% IV SOLN
INTRAVENOUS | Status: AC
  Filled 2015-05-04: qty 50

## 2015-05-04 MED ORDER — FENTANYL CITRATE (PF) 100 MCG/2ML IJ SOLN
INTRAMUSCULAR | Status: DC | PRN
  Administered 2015-05-04 (×2): 50 ug via INTRAVENOUS
  Administered 2015-05-04: 100 ug via INTRAVENOUS
  Administered 2015-05-04 (×2): 50 ug via INTRAVENOUS

## 2015-05-04 SURGICAL SUPPLY — 18 items
BAG DECANTER STRL (MISCELLANEOUS) ×1 IMPLANT
BALLN LUTONIX 5X120X130 (BALLOONS) ×2
BALLN LUTONIX DCB 7X60X130 (BALLOONS) ×2
BALLOON LUTONIX 5X120X130 (BALLOONS) IMPLANT
BALLOON LUTONIX DCB 7X60X130 (BALLOONS) IMPLANT
CATH ROYAL FLUSH PIG 5F 70CM (CATHETERS) ×1 IMPLANT
DEVICE PRESTO INFLATION (MISCELLANEOUS) ×1 IMPLANT
DEVICE STARCLOSE SE CLOSURE (Vascular Products) ×1 IMPLANT
GLIDEWIRE ANGLED SS 035X260CM (WIRE) ×1 IMPLANT
PACK ANGIOGRAPHY (CUSTOM PROCEDURE TRAY) ×1 IMPLANT
SET INTRO CAPELLA COAXIAL (SET/KITS/TRAYS/PACK) ×1 IMPLANT
SHEATH BALKIN 6FR (SHEATH) ×1 IMPLANT
SHEATH BRITE TIP 5FRX11 (SHEATH) ×1 IMPLANT
STENT LIFESTAR 10X40 (Permanent Stent) ×1 IMPLANT
SYR MEDRAD MARK V 150ML (SYRINGE) ×1 IMPLANT
TUBING CONTRAST HIGH PRESS 72 (TUBING) ×1 IMPLANT
WIRE J 3MM .035X145CM (WIRE) ×1 IMPLANT
WIRE MAGIC TORQUE 260C (WIRE) ×1 IMPLANT

## 2015-05-04 NOTE — H&P (Signed)
[facility] History & Physical Update  The patient was interviewed and re-examined.  The patient's previous History and Physical has been reviewed and is unchanged.  There is no change in the plan of care. We plan to proceed with the scheduled procedure.  Mala Gibbard, Dolores Lory, MD  05/04/2015, 5:24 PM

## 2015-05-04 NOTE — Op Note (Signed)
[facility] Percutaneous Study/Intervention Procedural Note   Date of Surgery: 05/04/2015  Surgeon:  Katha Cabal, MD.  Pre-operative Diagnosis: Atherosclerotic occlusive disease bilateral lower extremities with ulceration of the left great toe  Post-operative diagnosis: Same  Procedure(s) Performed: 1. Introduction catheter into left lower extremity 3rd order catheter placement  2. Contrast injection left lower extremity for distal runoff   3. Percutaneous transluminal angioplasty  of the superficial femoral artery and popliteal             4.  Percutaneous transluminal angioplasty and stent placement of the left external iliac artery 4. Star close closure right common femoral arteriotomy  Anesthesia: Conscious sedation with IV Versed and fentanyl  Sheath: 6 French right common femoral artery  Contrast: 65 cc  Fluoroscopy Time: 6.6 minutes  Indications: Yesenia Shepard presents with atherosclerotic occlusive disease bilateral lower extremities with ulceration of the left great toe. Pedal pulses are nonpalpable and her ABIs are 0.7 bilaterally. The risks and benefits are reviewed all questions answered patient agrees to proceed.  Procedure: Yesenia Shepard is a 64 y.o. y.o. female who was identified and appropriate procedural time out was performed. The patient was then placed supine on the table and prepped and draped in the usual sterile fashion.   Ultrasound was placed in the sterile sleeve and the right groin was evaluated the right common femoral artery was echolucent and pulsatile indicating patency.  Image was recorded for the permanent record and under real-time visualization a microneedle was inserted into the common femoral artery microwire followed by a micro-sheath.  A J-wire was then advanced through the micro-sheath and a  5 [place] sheath was then inserted over a J-wire. J-wire was then  advanced and a 5 French pigtail catheter was positioned at the level of T12. AP projection of the aorta was then obtained. Pigtail catheter was repositioned to above the bifurcation and a RAO view of the pelvis was obtained.  Subsequently a pigtail catheter with the stiff angle Glidewire was used to cross the aortic bifurcation the catheter wire were advanced down into the left distal external iliac artery. Oblique view of the femoral bifurcation was then obtained and subsequently the wire was reintroduced and the pigtail catheter negotiated into the SFA representing third order catheter placement. Distal runoff was then performed.  5000 units of heparin was then given and allowed to circulate and a 6 [place] sheath was advanced up and over the bifurcation and positioned in the femoral artery  KMP  catheter and stiff angle Glidewire were then negotiated down into the distal popliteal.  Distal runoff was then completed by hand injection through the catheter. The wire was then reintroduced and a 5 x 120 Lutonix balloon was used to angioplasty the superficial femoral and popliteal arteries. Inflations were to 12 atmospheres for 2 minutes. Follow-up images demonstrated wide patency through the SFA and popliteal no evidence of dissection less than 5% residual stenosis noted.  Attention was then turned to the lesion noted in the iliac magnified images of the external iliac at its origin and an RAO projection were obtained after the sheath was repositioned. Subsequently a 10 x 40 lifestar was deployed across the origin of the external iliac artery and then postdilated with a 7 x 40 Lutonix balloon. Inflation was to 12 atm for 2 minutes.   Distal runoff was then reassessed and found to be intact.  After review of these images the sheath is pulled into the  right external iliac oblique of the common femoral is obtained and a Star close device deployed. There no immediate Complications.  Findings: Initial  images demonstrated the aorta is widely patent bilateral common iliacs are patent there is an ostial stenosis of the external on the left of approximate 70-80%. There is a 50% narrowing of the common femoral. There is a string sign with tandem lesions in the distal SFA and proximal popliteal. Three-vessel runoff is noted to the foot. Following angioplasty using a 5 mm drug-coated balloon there is resolution of the lesion in the SFA. Following and plasty and stent placement within the iliac system there is resolution of the ostial external iliac lesion.   Disposition: Patient was taken to the recovery room in stable condition having tolerated the procedure well.  Kye Silverstein, Dolores Lory 04/12/2015,3:14 PM

## 2015-05-04 NOTE — Progress Notes (Signed)
   05/04/15 1600  Clinical Encounter Type  Visited With Patient and family together  Visit Type Initial;Spiritual support  Referral From Patient  Consult/Referral To Chaplain  Spiritual Encounters  Spiritual Needs Prayer  Provided pastoral presence, support and prayer to patient and family.  [place] [phone]

## 2015-05-06 ENCOUNTER — Encounter: Payer: Self-pay | Admitting: Vascular Surgery

## 2015-06-22 DIAGNOSIS — F4323 Adjustment disorder with mixed anxiety and depressed mood: Secondary | ICD-10-CM | POA: Diagnosis not present

## 2015-07-07 DIAGNOSIS — I771 Stricture of artery: Secondary | ICD-10-CM | POA: Diagnosis not present

## 2015-07-07 DIAGNOSIS — I70213 Atherosclerosis of native arteries of extremities with intermittent claudication, bilateral legs: Secondary | ICD-10-CM | POA: Diagnosis not present

## 2015-07-07 DIAGNOSIS — I10 Essential (primary) hypertension: Secondary | ICD-10-CM | POA: Diagnosis not present

## 2015-07-07 DIAGNOSIS — E785 Hyperlipidemia, unspecified: Secondary | ICD-10-CM | POA: Diagnosis not present

## 2015-07-07 DIAGNOSIS — L03116 Cellulitis of left lower limb: Secondary | ICD-10-CM | POA: Diagnosis not present

## 2015-07-07 DIAGNOSIS — I70245 Atherosclerosis of native arteries of left leg with ulceration of other part of foot: Secondary | ICD-10-CM | POA: Diagnosis not present

## 2015-07-07 DIAGNOSIS — I739 Peripheral vascular disease, unspecified: Secondary | ICD-10-CM | POA: Diagnosis not present

## 2015-07-20 DIAGNOSIS — L97521 Non-pressure chronic ulcer of other part of left foot limited to breakdown of skin: Secondary | ICD-10-CM | POA: Diagnosis not present

## 2015-08-01 DIAGNOSIS — I70213 Atherosclerosis of native arteries of extremities with intermittent claudication, bilateral legs: Secondary | ICD-10-CM | POA: Diagnosis not present

## 2015-08-01 DIAGNOSIS — E785 Hyperlipidemia, unspecified: Secondary | ICD-10-CM | POA: Diagnosis not present

## 2015-08-01 DIAGNOSIS — L03116 Cellulitis of left lower limb: Secondary | ICD-10-CM | POA: Diagnosis not present

## 2015-08-01 DIAGNOSIS — I70245 Atherosclerosis of native arteries of left leg with ulceration of other part of foot: Secondary | ICD-10-CM | POA: Diagnosis not present

## 2015-08-01 DIAGNOSIS — I10 Essential (primary) hypertension: Secondary | ICD-10-CM | POA: Diagnosis not present

## 2015-08-01 DIAGNOSIS — I771 Stricture of artery: Secondary | ICD-10-CM | POA: Diagnosis not present

## 2015-08-01 DIAGNOSIS — I739 Peripheral vascular disease, unspecified: Secondary | ICD-10-CM | POA: Diagnosis not present

## 2015-08-12 ENCOUNTER — Ambulatory Visit (INDEPENDENT_AMBULATORY_CARE_PROVIDER_SITE_OTHER): Payer: Medicare Other | Admitting: Family Medicine

## 2015-08-12 ENCOUNTER — Encounter: Payer: Self-pay | Admitting: Family Medicine

## 2015-08-12 VITALS — BP 158/80 | HR 53 | Temp 97.8°F | Ht 65.0 in | Wt 153.1 lb

## 2015-08-12 DIAGNOSIS — I10 Essential (primary) hypertension: Secondary | ICD-10-CM

## 2015-08-12 DIAGNOSIS — E1151 Type 2 diabetes mellitus with diabetic peripheral angiopathy without gangrene: Secondary | ICD-10-CM

## 2015-08-12 DIAGNOSIS — Z0000 Encounter for general adult medical examination without abnormal findings: Secondary | ICD-10-CM | POA: Diagnosis not present

## 2015-08-12 DIAGNOSIS — I739 Peripheral vascular disease, unspecified: Secondary | ICD-10-CM | POA: Diagnosis not present

## 2015-08-12 MED ORDER — LISINOPRIL 10 MG PO TABS: 10.0000 mg | ORAL_TABLET | Freq: Every day | ORAL | Status: DC

## 2015-08-12 NOTE — Assessment & Plan Note (Signed)
Patient to follow-up in approximately 1 week for repeat blood pressure check, labs, and discussion of preventative healthcare.

## 2015-08-12 NOTE — Assessment & Plan Note (Signed)
Stable currently on aspirin, Plavix, Lipitor.

## 2015-08-12 NOTE — Progress Notes (Signed)
Pre visit review using our clinic review tool, if applicable. No additional management support is needed unless otherwise documented below in the visit note. 

## 2015-08-12 NOTE — Assessment & Plan Note (Signed)
New problem. Starting lisinopril today.

## 2015-08-12 NOTE — Progress Notes (Signed)
Subjective:  Patient ID: Yesenia Shepard, female    DOB: [birth year]  Age: 65 y.o. MRN: [id number]  CC: Elevated BP  HPI:  65 year old female presents with complaints of elevated blood pressure. Additional concerns are outlined below.  PVD  Patient has now seen cardiology and has had a stent placed.  She endorses compliance with aspirin, statin, and Plavix.  Elevated BP  Patient reports that her blood pressure has been elevated since January.  More recently, her blood pressures have been in the [id number] to [id number] systolic.  No associated symptoms.  She is concerned about her blood pressure being elevated and would like to discuss this today.  DM-2  Patient was previously prediabetic.  Her A1c has trended up to 6.5. Patient now meets criteria for DM 2.  Diet controlled.  Social Hx   Social History   Social History  . Marital Status: Legally Separated    Spouse Name: N/A  . Number of Children: N/A  . Years of Education: N/A   Social History Main Topics  . Smoking status: Current Some Day Smoker -- 1.00 packs/day for 30 years    Types: Cigarettes  . Smokeless tobacco: Never Used     Comment: Currently smoking 1/2 ppd  . Alcohol Use: No  . Drug Use: No     Comment: Hx of.  . Sexual Activity: Not Currently   Other Topics Concern  . None   Social History Narrative   Review of Systems  Constitutional: Negative.   Cardiovascular:       Elevated BP.  Psychiatric/Behavioral: The patient is nervous/anxious.    Objective:  BP 158/80 mmHg  Pulse 53  Temp(Src) 97.8 F (36.6 C) (Oral)  Ht 5\' 5"  (1.651 m)  Wt 153 lb 2 oz (69.457 kg)  BMI 25.48 kg/m2  SpO2 97%  BP/Weight 08/12/2015 05/04/2015 [id number]  Systolic BP 0000000 [id number]  Diastolic BP 80 62 72  Wt. (Lbs) 153.13 152 152.13  BMI 25.48 25.29 24.92   Physical Exam  Constitutional: She appears well-developed. No distress.  Cardiovascular: Normal rate and regular rhythm.   Murmur heard. Pulmonary/Chest: Effort  normal and breath sounds normal. No respiratory distress. She has no wheezes. She has no rales.  Neurological: She is alert.  Psychiatric:  Anxious; Crying.  Vitals reviewed.  Lab Results  Component Value Date   WBC 8.1 04/19/2015   HGB 13.4 04/19/2015   HCT 40.7 04/19/2015   PLT 250.0 04/19/2015   GLUCOSE 153* 04/19/2015   CHOL 113 05/03/2015   TRIG 45 05/03/2015   HDL 47 05/03/2015   LDLCALC 57 05/03/2015   ALT 16 04/19/2015   AST 19 04/19/2015   NA 138 04/19/2015   K 4.4 04/19/2015   CL 100 04/19/2015   CREATININE 0.67 05/04/2015   BUN 10 05/04/2015   CO2 34* 04/19/2015   TSH 0.50 02/04/2015   HGBA1C 6.5* 05/03/2015   Assessment & Plan:   Problem List Items Addressed This Visit    DM type 2 (diabetes mellitus, type 2) ([place])    New problem. A1c recently elevated at 6.5. Patient is criteria for diabetes. Currently diet-controlled. Will continue to monitor closely.      Relevant Medications   aspirin 81 MG tablet   lisinopril (PRINIVIL,ZESTRIL) 10 MG tablet   Other Relevant Orders   HgB A1c   Essential hypertension - Primary    New problem. Starting lisinopril today.       Relevant Medications   aspirin 81 MG  tablet   lisinopril (PRINIVIL,ZESTRIL) 10 MG tablet   Other Relevant Orders   Basic Metabolic Panel (BMET)   Peripheral vascular disease ([place])    Stable currently on aspirin, Plavix, Lipitor.      Relevant Medications   aspirin 81 MG tablet   lisinopril (PRINIVIL,ZESTRIL) 10 MG tablet   Preventative health care    Patient to follow-up in approximately 1 week for repeat blood pressure check, labs, and discussion of preventative healthcare.         Meds ordered this encounter  Medications  . clopidogrel (PLAVIX) 75 MG tablet    Sig:   . silver sulfADIAZINE (SILVADENE) 1 % cream    Sig:   . aspirin 81 MG tablet    Sig: Take 81 mg by mouth daily.  Marland Kitchen lisinopril (PRINIVIL,ZESTRIL) 10 MG tablet    Sig: Take 1 tablet (10 mg total) by mouth  daily.    Dispense:  90 tablet    Refill:  3    Follow-up: Return for 7-10 days; BP follow up and preventative health care.  [place]

## 2015-08-12 NOTE — Assessment & Plan Note (Signed)
New problem. A1c recently elevated at 6.5. Patient is criteria for diabetes. Currently diet-controlled. Will continue to monitor closely.

## 2015-08-12 NOTE — Patient Instructions (Signed)
Follow up in 7-10 days.  We will get labs and check your BP at that time.  We will also talk about your preventative health care.  Take care  Dr. Lacinda Axon

## 2015-08-17 DIAGNOSIS — L97521 Non-pressure chronic ulcer of other part of left foot limited to breakdown of skin: Secondary | ICD-10-CM | POA: Diagnosis not present

## 2015-08-19 ENCOUNTER — Ambulatory Visit (INDEPENDENT_AMBULATORY_CARE_PROVIDER_SITE_OTHER): Payer: Medicare Other | Admitting: Family Medicine

## 2015-08-19 ENCOUNTER — Encounter: Payer: Self-pay | Admitting: Family Medicine

## 2015-08-19 VITALS — BP 108/64 | HR 72 | Temp 98.1°F | Ht 65.0 in | Wt 145.4 lb

## 2015-08-19 DIAGNOSIS — Z114 Encounter for screening for human immunodeficiency virus [HIV]: Secondary | ICD-10-CM | POA: Diagnosis not present

## 2015-08-19 DIAGNOSIS — Z23 Encounter for immunization: Secondary | ICD-10-CM | POA: Diagnosis not present

## 2015-08-19 DIAGNOSIS — Z0000 Encounter for general adult medical examination without abnormal findings: Secondary | ICD-10-CM | POA: Diagnosis not present

## 2015-08-19 DIAGNOSIS — I10 Essential (primary) hypertension: Secondary | ICD-10-CM

## 2015-08-19 DIAGNOSIS — Z1159 Encounter for screening for other viral diseases: Secondary | ICD-10-CM

## 2015-08-19 DIAGNOSIS — E785 Hyperlipidemia, unspecified: Secondary | ICD-10-CM | POA: Diagnosis not present

## 2015-08-19 DIAGNOSIS — E2839 Other primary ovarian failure: Secondary | ICD-10-CM

## 2015-08-19 DIAGNOSIS — Z1239 Encounter for other screening for malignant neoplasm of breast: Secondary | ICD-10-CM | POA: Diagnosis not present

## 2015-08-19 DIAGNOSIS — Z720 Tobacco use: Secondary | ICD-10-CM | POA: Diagnosis not present

## 2015-08-19 DIAGNOSIS — I739 Peripheral vascular disease, unspecified: Secondary | ICD-10-CM | POA: Diagnosis not present

## 2015-08-19 DIAGNOSIS — F17200 Nicotine dependence, unspecified, uncomplicated: Secondary | ICD-10-CM

## 2015-08-19 MED ORDER — TETANUS-DIPHTH-ACELL PERTUSSIS 5-2.5-18.5 LF-MCG/0.5 IM SUSP: 0.5000 mL | Freq: Once | INTRAMUSCULAR | Status: DC

## 2015-08-19 MED ORDER — ZOSTER VACCINE LIVE 19400 UNT/0.65ML ~~LOC~~ SOLR: 0.6500 mL | Freq: Once | SUBCUTANEOUS | Status: DC

## 2015-08-19 NOTE — Progress Notes (Signed)
Pre visit review using our clinic review tool, if applicable. No additional management support is needed unless otherwise documented below in the visit note. 

## 2015-08-19 NOTE — Patient Instructions (Addendum)
Follow up with me in ~ 3 months.  Take care  Dr. Lacinda Axon   Think about the Wellbutrin for the smoking.

## 2015-08-20 DIAGNOSIS — E785 Hyperlipidemia, unspecified: Secondary | ICD-10-CM | POA: Insufficient documentation

## 2015-08-20 LAB — BASIC METABOLIC PANEL
BUN: 18 mg/dL (ref 7–25)
CO2: 30 mmol/L (ref 20–31)
Calcium: 9.4 mg/dL (ref 8.6–10.4)
Chloride: 101 mmol/L (ref 98–110)
Creat: 0.83 mg/dL (ref 0.50–0.99)
Glucose, Bld: 152 mg/dL — ABNORMAL HIGH (ref 65–99)
Potassium: 4.7 mmol/L (ref 3.5–5.3)
Sodium: 138 mmol/L (ref 135–146)

## 2015-08-20 LAB — HEPATITIS C ANTIBODY: HCV Ab: REACTIVE — AB

## 2015-08-20 NOTE — Assessment & Plan Note (Addendum)
Prevnar given today. Will arrange mammogram & Dexa scan. Hep C screening today.  Rx's for Zostavax and Tdap given today.  Will arrange Cologuard.

## 2015-08-20 NOTE — Assessment & Plan Note (Signed)
Well controlled; at goal. Continue Lisinopril. BMP today.

## 2015-08-20 NOTE — Progress Notes (Signed)
Subjective:  Patient ID: Yesenia Shepard, female    DOB: 05-Jun-1951  Age: 65 y.o. MRN: [id number]  CC: Follow up and Discuss preventative health care as she now has insurance.  HPI:  65 year old female with past history of anxiety and depression, peripheral vascular disease, hypertension, and recent diagnosis of type 2 diabetes presents for follow up.  HTN  Well controlled at this time (see vitals below).  Patient was recently started on Lisinopril. Tolerating well.  Left great toe ulcer/PAD  Doing well at this time.  Compliant with Aspirin, Statin, Plavix.  HLD   Well controlled.  Doing well on Lipitor.  Social Hx   Social History   Social History  . Marital Status: Legally Separated    Spouse Name: N/A  . Number of Children: N/A  . Years of Education: N/A   Social History Main Topics  . Smoking status: Current Some Day Smoker -- 1.00 packs/day for 30 years    Types: Cigarettes  . Smokeless tobacco: Never Used     Comment: Currently smoking 1/2 ppd  . Alcohol Use: No  . Drug Use: No     Comment: Hx of.  . Sexual Activity: Not Currently   Other Topics Concern  . None   Social History Narrative   Review of Systems  Constitutional: Negative.   Respiratory: Negative.   Psychiatric/Behavioral: The patient is nervous/anxious.     Objective:  BP 108/64 mmHg  Pulse 72  Temp(Src) 98.1 F (36.7 C) (Oral)  Ht 5\' 5"  (1.651 m)  Wt 145 lb 6 oz (65.942 kg)  BMI 24.19 kg/m2  SpO2 96%  BP/Weight 08/19/2015 08/12/2015 [id number]  Systolic BP 123XX123 0000000 A999333  Diastolic BP 64 80 62  Wt. (Lbs) 145.38 153.13 152  BMI 24.19 25.48 25.29   Physical Exam  Constitutional: She is oriented to person, place, and time. She appears well-developed. No distress.  Cardiovascular: Normal rate and regular rhythm.   DP and PT pulses palpable bilaterally.   Pulmonary/Chest: Effort normal and breath sounds normal.  Neurological: She is alert and oriented to person, place, and time.    Psychiatric:  Anxious.  Vitals reviewed.  Lab Results  Component Value Date   WBC 8.1 04/19/2015   HGB 13.4 04/19/2015   HCT 40.7 04/19/2015   PLT 250.0 04/19/2015   GLUCOSE 153* 04/19/2015   CHOL 113 05/03/2015   TRIG 45 05/03/2015   HDL 47 05/03/2015   LDLCALC 57 05/03/2015   ALT 16 04/19/2015   AST 19 04/19/2015   NA 138 04/19/2015   K 4.4 04/19/2015   CL 100 04/19/2015   CREATININE 0.67 05/04/2015   BUN 10 05/04/2015   CO2 34* 04/19/2015   TSH 0.50 02/04/2015   HGBA1C 6.5* 05/03/2015    Assessment & Plan:   Problem List Items Addressed This Visit    Preventative health care    Prevnar given today. Will arrange mammogram & Dexa scan. Hep C screening today.  Rx's for Zostavax and Tdap given today.  Will arrange Cologuard.        Relevant Orders   Basic Metabolic Panel (BMET)   Peripheral vascular disease ([place])    Doing well s/p angioplasty. Continue Aspirin, Lipitor and Plavix.      Essential hypertension - Primary    Well controlled; at goal. Continue Lisinopril. BMP today.      Relevant Orders   Basic Metabolic Panel (BMET)   HLD (hyperlipidemia)    Stable on Lipitor. Will  continue.       Other Visit Diagnoses    Smoker        Relevant Orders    Basic Metabolic Panel (BMET)    Need for prophylactic vaccination against Streptococcus pneumoniae (pneumococcus)        Relevant Orders    Pneumococcal conjugate vaccine 13-valent (Completed)    Basic Metabolic Panel (BMET)    Need for hepatitis C screening test        Relevant Orders    Hepatitis C Antibody    Basic Metabolic Panel (BMET)    Estrogen deficiency        Relevant Orders    DG Bone Density    Basic Metabolic Panel (BMET)    Breast cancer screening        Relevant Orders    MM Digital Screening    Basic Metabolic Panel (BMET)       Meds ordered this encounter  Medications  . Tdap (BOOSTRIX) 5-2.5-18.5 LF-MCG/0.5 injection    Sig: Inject 0.5 mLs into the muscle once.     Dispense:  0.5 mL    Refill:  0  . zoster vaccine live, PF, (ZOSTAVAX) 29562 UNT/0.65ML injection    Sig: Inject 19,400 Units into the skin once.    Dispense:  1 each    Refill:  0    Follow-up: Return in about 3 months (around 11/16/2015) for Follow up Chronic medical issues.  [place]

## 2015-08-20 NOTE — Assessment & Plan Note (Signed)
Doing well s/p angioplasty. Continue Aspirin, Lipitor and Plavix.

## 2015-08-20 NOTE — Assessment & Plan Note (Signed)
Stable on Lipitor. Will continue.

## 2015-08-23 ENCOUNTER — Other Ambulatory Visit: Payer: Self-pay | Admitting: Family Medicine

## 2015-08-23 DIAGNOSIS — B1920 Unspecified viral hepatitis C without hepatic coma: Secondary | ICD-10-CM

## 2015-08-23 DIAGNOSIS — R768 Other specified abnormal immunological findings in serum: Secondary | ICD-10-CM

## 2015-08-23 LAB — HEPATITIS C RNA QUANTITATIVE

## 2015-08-24 ENCOUNTER — Other Ambulatory Visit: Payer: Self-pay

## 2015-08-24 ENCOUNTER — Telehealth: Payer: Self-pay | Admitting: Family Medicine

## 2015-08-24 NOTE — Telephone Encounter (Signed)
Pt called stating she was returning your call from today. Call pt @ [phone]. Thank you!

## 2015-08-24 NOTE — Telephone Encounter (Signed)
Gave patient the information about repeat labs.

## 2015-08-26 ENCOUNTER — Other Ambulatory Visit (INDEPENDENT_AMBULATORY_CARE_PROVIDER_SITE_OTHER): Payer: Medicare Other

## 2015-08-26 DIAGNOSIS — R894 Abnormal immunological findings in specimens from other organs, systems and tissues: Secondary | ICD-10-CM | POA: Diagnosis not present

## 2015-08-26 DIAGNOSIS — B1920 Unspecified viral hepatitis C without hepatic coma: Secondary | ICD-10-CM

## 2015-08-26 DIAGNOSIS — R768 Other specified abnormal immunological findings in serum: Secondary | ICD-10-CM

## 2015-08-26 NOTE — Progress Notes (Signed)
Patient here today for lab draw only. 

## 2015-08-29 LAB — HEPATITIS C RNA QUANTITATIVE
HCV Quantitative Log: 5.79 {Log} — ABNORMAL HIGH (ref <1.18)
HCV Quantitative: 622863 IU/mL — ABNORMAL HIGH (ref <15)

## 2015-08-30 ENCOUNTER — Telehealth: Payer: Self-pay | Admitting: Family Medicine

## 2015-08-30 NOTE — Telephone Encounter (Signed)
Left message to call office to advise where pt had last mammo and dexa scan.[facility]

## 2015-09-01 ENCOUNTER — Other Ambulatory Visit: Payer: Self-pay | Admitting: Family Medicine

## 2015-09-01 ENCOUNTER — Telehealth: Payer: Self-pay | Admitting: Family Medicine

## 2015-09-01 DIAGNOSIS — B182 Chronic viral hepatitis C: Secondary | ICD-10-CM

## 2015-09-01 NOTE — Telephone Encounter (Signed)
Spoke with patient, verbalized lab results.

## 2015-09-01 NOTE — Telephone Encounter (Signed)
Pt was calling back to get lab results. Call pt @ [phone]. Thank you!

## 2015-09-02 ENCOUNTER — Telehealth: Payer: Self-pay | Admitting: Family Medicine

## 2015-09-02 NOTE — Telephone Encounter (Signed)
The patient is wanting a referral order put in the system to see Dr . Lalla Brothers for Infectious Disease [address] [place] [address] Phone # [phone] Fax # [phone]. Wanting an appointment to follow up on her Hepatitis C .

## 2015-09-02 NOTE — Telephone Encounter (Signed)
Please advise, I see that a referral is in already, not sure that is to the same person?

## 2015-09-08 NOTE — Telephone Encounter (Signed)
Pt called to check the status of the referral to Dr Drucilla Schmidt. Attention to Diane [phone]. Fax number [phone]. Thank you!

## 2015-09-08 NOTE — Telephone Encounter (Signed)
Please advise 

## 2015-09-09 NOTE — Telephone Encounter (Signed)
The referral has been placed. I followed up with them last week. They have received the referral and will be getting her scheduled soon per the message that I received back. I will follow up with Yesenia Shepard to let her know.

## 2015-09-09 NOTE — Telephone Encounter (Signed)
This message should be directed to the referral coordinators. Melissa - Can you check on the status of the referral?

## 2015-09-13 DIAGNOSIS — F4322 Adjustment disorder with anxiety: Secondary | ICD-10-CM | POA: Diagnosis not present

## 2015-09-15 DIAGNOSIS — E785 Hyperlipidemia, unspecified: Secondary | ICD-10-CM | POA: Diagnosis not present

## 2015-09-15 DIAGNOSIS — I10 Essential (primary) hypertension: Secondary | ICD-10-CM | POA: Diagnosis not present

## 2015-09-15 DIAGNOSIS — I739 Peripheral vascular disease, unspecified: Secondary | ICD-10-CM | POA: Diagnosis not present

## 2015-09-15 DIAGNOSIS — I70213 Atherosclerosis of native arteries of extremities with intermittent claudication, bilateral legs: Secondary | ICD-10-CM | POA: Diagnosis not present

## 2015-09-15 DIAGNOSIS — I70245 Atherosclerosis of native arteries of left leg with ulceration of other part of foot: Secondary | ICD-10-CM | POA: Diagnosis not present

## 2015-09-15 DIAGNOSIS — I771 Stricture of artery: Secondary | ICD-10-CM | POA: Diagnosis not present

## 2015-09-15 DIAGNOSIS — L03116 Cellulitis of left lower limb: Secondary | ICD-10-CM | POA: Diagnosis not present

## 2015-09-20 DIAGNOSIS — L97521 Non-pressure chronic ulcer of other part of left foot limited to breakdown of skin: Secondary | ICD-10-CM | POA: Diagnosis not present

## 2015-10-10 DIAGNOSIS — F4323 Adjustment disorder with mixed anxiety and depressed mood: Secondary | ICD-10-CM | POA: Diagnosis not present

## 2015-10-18 ENCOUNTER — Other Ambulatory Visit: Payer: Medicare Other

## 2015-10-18 DIAGNOSIS — Z114 Encounter for screening for human immunodeficiency virus [HIV]: Secondary | ICD-10-CM

## 2015-10-18 DIAGNOSIS — B182 Chronic viral hepatitis C: Secondary | ICD-10-CM | POA: Diagnosis not present

## 2015-10-18 DIAGNOSIS — Z1159 Encounter for screening for other viral diseases: Secondary | ICD-10-CM | POA: Diagnosis not present

## 2015-10-18 LAB — CBC WITH DIFFERENTIAL/PLATELET
Basophils Absolute: 0 cells/uL (ref 0–200)
Basophils Relative: 0 %
Eosinophils Absolute: 50 cells/uL (ref 15–500)
Eosinophils Relative: 1 %
HCT: 41.8 % (ref 35.0–45.0)
Hemoglobin: 13.6 g/dL (ref 11.7–15.5)
Lymphocytes Relative: 34 %
Lymphs Abs: 1700 cells/uL (ref 850–3900)
MCH: 30.2 pg (ref 27.0–33.0)
MCHC: 32.5 g/dL (ref 32.0–36.0)
MCV: 92.9 fL (ref 80.0–100.0)
MPV: 11.4 fL (ref 7.5–12.5)
Monocytes Absolute: 200 cells/uL (ref 200–950)
Monocytes Relative: 4 %
Neutro Abs: 3050 cells/uL (ref 1500–7800)
Neutrophils Relative %: 61 %
Platelets: 266 10*3/uL (ref 140–400)
RBC: 4.5 MIL/uL (ref 3.80–5.10)
RDW: 13 % (ref 11.0–15.0)
WBC: 5 10*3/uL (ref 3.8–10.8)

## 2015-10-18 LAB — COMPREHENSIVE METABOLIC PANEL
ALT: 29 U/L (ref 6–29)
AST: 34 U/L (ref 10–35)
Albumin: 3.8 g/dL (ref 3.6–5.1)
Alkaline Phosphatase: 54 U/L (ref 33–130)
BUN: 9 mg/dL (ref 7–25)
CO2: 29 mmol/L (ref 20–31)
Calcium: 8.6 mg/dL (ref 8.6–10.4)
Chloride: 100 mmol/L (ref 98–110)
Creat: 0.81 mg/dL (ref 0.50–0.99)
Glucose, Bld: 163 mg/dL — ABNORMAL HIGH (ref 65–99)
Potassium: 4.1 mmol/L (ref 3.5–5.3)
Sodium: 139 mmol/L (ref 135–146)
Total Bilirubin: 0.8 mg/dL (ref 0.2–1.2)
Total Protein: 6.5 g/dL (ref 6.1–8.1)

## 2015-10-18 LAB — PROTIME-INR
INR: 1.17 (ref <1.50)
Prothrombin Time: 15.1 seconds (ref 11.6–15.2)

## 2015-10-18 LAB — HIV ANTIBODY (ROUTINE TESTING W REFLEX): HIV 1&2 Ab, 4th Generation: NONREACTIVE

## 2015-10-18 LAB — IRON: Iron: 159 ug/dL (ref 45–160)

## 2015-10-19 LAB — HEPATITIS B SURFACE ANTIBODY,QUALITATIVE: Hep B S Ab: NEGATIVE

## 2015-10-19 LAB — HEPATITIS B CORE ANTIBODY, TOTAL: Hep B Core Total Ab: NONREACTIVE

## 2015-10-19 LAB — ANTI-NUCLEAR AB-TITER (ANA TITER): ANA Titer 1: 1:40 {titer} — ABNORMAL HIGH

## 2015-10-19 LAB — HEPATITIS A ANTIBODY, TOTAL: Hep A Total Ab: NONREACTIVE

## 2015-10-19 LAB — HEPATITIS B SURFACE ANTIGEN: Hepatitis B Surface Ag: NEGATIVE

## 2015-10-19 LAB — ANA: Anti Nuclear Antibody(ANA): POSITIVE — AB

## 2015-10-20 DIAGNOSIS — B182 Chronic viral hepatitis C: Secondary | ICD-10-CM | POA: Diagnosis not present

## 2015-10-20 DIAGNOSIS — Z114 Encounter for screening for human immunodeficiency virus [HIV]: Secondary | ICD-10-CM | POA: Diagnosis not present

## 2015-10-22 LAB — HCV RNA,LIPA RFLX NS5A DRUG RESIST

## 2015-10-23 LAB — HCV RNA NS5A DRUG RESISTANCE

## 2015-11-02 DIAGNOSIS — L97521 Non-pressure chronic ulcer of other part of left foot limited to breakdown of skin: Secondary | ICD-10-CM | POA: Diagnosis not present

## 2015-11-02 DIAGNOSIS — L03032 Cellulitis of left toe: Secondary | ICD-10-CM | POA: Diagnosis not present

## 2015-11-09 ENCOUNTER — Ambulatory Visit (INDEPENDENT_AMBULATORY_CARE_PROVIDER_SITE_OTHER): Payer: Medicare Other | Admitting: Internal Medicine

## 2015-11-09 ENCOUNTER — Encounter: Payer: Self-pay | Admitting: Internal Medicine

## 2015-11-09 ENCOUNTER — Ambulatory Visit: Payer: Medicare Other | Admitting: *Deleted

## 2015-11-09 VITALS — BP 100/63 | HR 62 | Temp 98.5°F | Wt 133.0 lb

## 2015-11-09 DIAGNOSIS — F1020 Alcohol dependence, uncomplicated: Secondary | ICD-10-CM

## 2015-11-09 DIAGNOSIS — B182 Chronic viral hepatitis C: Secondary | ICD-10-CM

## 2015-11-09 MED ORDER — LEDIPASVIR-SOFOSBUVIR 90-400 MG PO TABS: 1.0000 | ORAL_TABLET | Freq: Every day | ORAL | Status: DC

## 2015-11-09 NOTE — Patient Instructions (Signed)
Date 11/09/2015  Dear Ms Yesenia Shepard, As discussed in the [facility], your hepatitis C therapy will include the following medications:          Harvoni 90mg /400mg  tablet:           Take 1 tablet by mouth once daily   Please note that ALL MEDICATIONS WILL START ON THE SAME DATE for a total of 12 weeks. ---------------------------------------------------------------- Your HCV Treatment Start Date: TBA   Your HCV genotype:  1a    Liver Fibrosis: TBD    ---------------------------------------------------------------- YOUR PHARMACY CONTACT:   [facility] [place] Phone: [phone] Hours: Monday to Friday 7:30 am to 6:00 pm   Please always contact your pharmacy at least 3-4 business days before you run out of medications to ensure your next month's medication is ready or 1 week prior to running out if you receive it by mail.  Remember, each prescription is for 28 days. ---------------------------------------------------------------- GENERAL NOTES REGARDING YOUR HEPATITIS C MEDICATION:  SOFOSBUVIR/LEDIPASVIR (HARVONI): - Harvoni tablet is taken daily with OR without food. - The tablets are orange. - The tablets should be stored at room temperature.  - Acid reducing agents such as H2 blockers (ie. Pepcid (famotidine), Zantac (ranitidine), Tagamet (cimetidine), Axid (nizatidine) and proton pump inhibitors (ie. Prilosec (omeprazole), Protonix (pantoprazole), Nexium (esomeprazole), or Aciphex (rabeprazole)) can decrease effectiveness of Harvoni. Do not take until you have discussed with a health care provider.    -Antacids that contain magnesium and/or aluminum hydroxide (ie. Milk of [place], Rolaids, Gaviscon, Maalox, Mylanta, an dArthritis Pain Formula)can reduce absorption of Harvoni, so take them at least 4 hours before or after Harvoni.  -Calcium carbonate (calcium supplements or antacids such as Tums, Caltrate,  Os-Cal)needs to be taken at least 4 hours hours before or after Harvoni.  -St. John's wort or any products that contain St. John's wort like some herbal supplements  Please inform the office prior to starting any of these medications.  - The common side effects associated with Harvoni include:      1. Fatigue      2. Headache      3. Nausea      4. Diarrhea      5. Insomnia  Please note that this only lists the most common side effects and is NOT a comprehensive list of the potential side effects of these medications. For more information, please review the drug information sheets that come with your medication package from the pharmacy.  ---------------------------------------------------------------- GENERAL HELPFUL HINTS ON HCV THERAPY: 1. Stay well-hydrated. 2. Notify the [facility] of any changes in your other over-the-counter/herbal or prescription medications. 3. If you miss a dose of your medication, take the missed dose as soon as you remember. Return to your regular time/dose schedule the next day.  4.  Do not stop taking your medications without first talking with your healthcare provider. 5.  You may take Tylenol (acetaminophen), as long as the dose is less than 2000 mg (OR no more than 4 tablets of the Tylenol Extra Strengths 500mg  tablet) in 24 hours. 6.  You will see our pharmacist-specialist within the first 2 weeks of starting your medication. 7.  You will need to obtain routine labs around week 4 and12 weeks after starting and then 3 to 6 months after finishing Harvoni.    Scharlene Gloss, [place] for [place],  [place]  [phone]

## 2015-11-09 NOTE — Progress Notes (Signed)
[facility]ious [facility]   CC: consideration for treatment for chronic hepatitis C  HPI:  +Yesenia Shepard is a 65 y.o. female who presents for initial evaluation and management of chronic hepatitis C.  Patient tested positive last year. Hepatitis C-associated risk factors present are: IV drug abuse (details: over 10 years ago). Patient denies multiple sexual partners, renal dialysis, sexual contact with person with liver disease, tattoos. Patient has had other studies performed. Results: hepatitis C RNA by PCR, result: positive. Patient has not had prior treatment for Hepatitis C. Patient does not have a past history of liver disease. Patient does not have a family history of liver disease. Patient does not  have associated signs or symptoms related to liver disease.  Labs reviewed and confirm chronic hepatitis C with a positive viral load.   Records reviewed from PCP, on methadone.  No alcohol.        Patient does not have documented immunity to Hepatitis A. Patient does not have documented immunity to Hepatitis B.    Review of Systems:   Constitutional: negative for fatigue and malaise Gastrointestinal: negative for diarrhea All other systems reviewed and are negative      Past Medical History  Diagnosis Date  . Chronic pain   . Drug abuse     History of polysubstance abuse; currently on methadone.  . Depression   . History of chicken pox   . Arthritis   . UTI (urinary tract infection)     History of    Prior to Admission medications   Medication Sig Start Date End Date Taking? Authorizing Provider  aspirin 81 MG tablet Take 81 mg by mouth daily.   Yes Historical Provider, MD  atorvastatin (LIPITOR) 40 MG tablet Take 1 tablet (40 mg total) by mouth daily. 04/21/15  Yes Coral Spikes, DO  clonazePAM (KLONOPIN) 1 MG tablet Take 2 mg by mouth 2 (two) times daily.  11/11/12  Yes Historical Provider, MD  clopidogrel (PLAVIX) 75 MG tablet  07/18/15  Yes Historical  Provider, MD  lisinopril (PRINIVIL,ZESTRIL) 10 MG tablet Take 1 tablet (10 mg total) by mouth daily. 08/12/15  Yes Coral Spikes, DO  methadone (DOLOPHINE) 10 MG tablet Take 130 mg by mouth daily.   Yes Historical Provider, MD  silver sulfADIAZINE (SILVADENE) 1 % cream  07/19/15  Yes Historical Provider, MD  Tdap (BOOSTRIX) 5-2.5-18.5 LF-MCG/0.5 injection Inject 0.5 mLs into the muscle once. 08/19/15  Yes Coral Spikes, DO  traZODone (DESYREL) 100 MG tablet Take 100 mg by mouth at bedtime.   Yes Historical Provider, MD  zoster vaccine live, PF, (ZOSTAVAX) 29562 UNT/0.65ML injection Inject 19,400 Units into the skin once. 08/19/15  Yes Jayce G Cook, DO  Ledipasvir-Sofosbuvir (HARVONI) 90-400 MG TABS Take 1 tablet by mouth daily. 11/09/15   Thayer Headings, MD    Allergies  Allergen Reactions  . Codeine     Social History  Substance Use Topics  . Smoking status: Current Some Day Smoker -- 1.00 packs/day for 30 years    Types: Cigarettes  . Smokeless tobacco: Never Used     Comment: Currently smoking 1/2 ppd  . Alcohol Use: No    Family History  Problem Relation Age of Onset  . Arthritis Mother   . Mental illness Mother     depression  . Diabetes Mother   . Cancer Father     colon  . Heart disease Father   . Diabetes Father  no cirrhosis, no liver cancer   Objective:  Constitutional: in no apparent distress and alert,  Filed Vitals:   11/09/15 1354  BP: 100/63  Pulse: 62  Temp: 98.5 F (36.9 C)   Eyes: anicteric Cardiovascular: Cor RRR and No murmurs Respiratory: CTA B; normal respiratory effort Gastrointestinal: Bowel sounds are normal, liver is not enlarged, spleen is not enlarged Musculoskeletal: no pedal edema noted Skin: negatives: small petechial lesions on both forearms; no porphyria cutanea tarda Lymphatic: no cervical lymphadenopathy   Laboratory Genotype: No results found for: HCVGENOTYPE HCV viral load:  Lab Results  Component Value Date   HCVQUANT [id number]*  08/26/2015   Lab Results  Component Value Date   WBC 5.0 10/18/2015   HGB 13.6 10/18/2015   HCT 41.8 10/18/2015   MCV 92.9 10/18/2015   PLT 266 10/18/2015    Lab Results  Component Value Date   CREATININE 0.81 10/18/2015   BUN 9 10/18/2015   NA 139 10/18/2015   K 4.1 10/18/2015   CL 100 10/18/2015   CO2 29 10/18/2015    Lab Results  Component Value Date   ALT 29 10/18/2015   AST 34 10/18/2015   ALKPHOS 54 10/18/2015     Labs and history reviewed and show CHILD-PUGH A  5-6 points: Child class A 7-9 points: Child class B 10-15 points: Child class C  Lab Results  Component Value Date   INR 1.17 10/18/2015   BILITOT 0.8 10/18/2015   ALBUMIN 3.8 10/18/2015     Assessment: New Patient with Chronic Hepatitis C genotype 1a, untreated.  I discussed with the patient the lab findings that confirm chronic hepatitis C as well as the natural history and progression of disease including about 30% of people who develop cirrhosis of the liver if left untreated and once cirrhosis is established there is a 2-7% risk per year of liver cancer and liver failure.  I discussed the importance of treatment and benefits in reducing the risk, even if significant liver fibrosis exists.   Plan: 1) Patient counseled extensively on limiting acetaminophen to no more than 2 grams daily, avoidance of alcohol. 2) Transmission discussed with patient including sexual transmission, sharing razors and toothbrush.   3) Will need referral to gastroenterology if concern for cirrhosis 4) Will need referral for substance abuse counseling: No.; Further work up to include urine drug screen  No. 5) Will prescribe Harvoni for 12 weeks 6) Hepatitis A vaccine Yes.   7) Hepatitis B vaccine Yes.   8) Pneumovax vaccine if concern for cirrhosis 9) Further work up to include liver staging with elastography 10) will follow up after starting medication On 40 mg atorvastatin, will monitor for symptoms of myalgias on  medication.  Probably can hold it during treatment if needed. Will check with PCP if it comes to that.

## 2015-11-09 NOTE — BH Specialist Note (Signed)
Counselor met with Tate in the exam room due to observed tearful spells.  Patient was oriented times four with sad affect but appropriate dress.  Patient was alert and talkative.  Patient said that she was experiencing a lot of sadness because her daughter who is 58 was dying of lung cancer.  Counselor provided support and encouragement.  Counselor reviewed the stages of grieving and shared that she was already experiencing the stages.  Patient said that it was good to just have someone to talk to besides her daughter because she can say freely what she is feeling. Counselor encouraged patient to make another appointment if she needed to process again with someone and have additional support. Patient agreed she would.  Rolena Infante, MA, LPC [facility]

## 2015-11-10 ENCOUNTER — Other Ambulatory Visit: Payer: Self-pay

## 2015-11-10 DIAGNOSIS — Z23 Encounter for immunization: Secondary | ICD-10-CM

## 2015-11-11 ENCOUNTER — Telehealth: Payer: Self-pay

## 2015-11-11 NOTE — Telephone Encounter (Signed)
Called patient to get days of availability so an elastography procedure can be scheduled per Dr. Linus Salmons verbal order. Patient stated that she was not at home and would call back once she can look at her schedule. Will continue to follow. Rodman Key, LPN

## 2015-11-15 ENCOUNTER — Telehealth: Payer: Self-pay

## 2015-11-15 NOTE — Telephone Encounter (Signed)
Called patient back to notify her of elastography appointment on May 30th at [place] at [facility] Radiology department. She is to arrive by 10:45am and to have nothing by mouth 8 hours prior. Rodman Key, LPN

## 2015-12-06 ENCOUNTER — Ambulatory Visit (HOSPITAL_COMMUNITY)
Admission: RE | Admit: 2015-12-06 | Discharge: 2015-12-06 | Disposition: A | Discharge status: Home / Self Care | Payer: Medicare Other | Source: Ambulatory Visit | Attending: Internal Medicine | Admitting: Internal Medicine

## 2015-12-06 DIAGNOSIS — Z9049 Acquired absence of other specified parts of digestive tract: Secondary | ICD-10-CM | POA: Insufficient documentation

## 2015-12-06 DIAGNOSIS — B182 Chronic viral hepatitis C: Secondary | ICD-10-CM | POA: Diagnosis not present

## 2015-12-06 DIAGNOSIS — N281 Cyst of kidney, acquired: Secondary | ICD-10-CM | POA: Diagnosis not present

## 2015-12-06 DIAGNOSIS — B1920 Unspecified viral hepatitis C without hepatic coma: Secondary | ICD-10-CM | POA: Diagnosis not present

## 2015-12-07 MED FILL — *HARVONI 90-400 MG TABLET: 90-400 | 28 days supply | Qty: 28 | Fill #0

## 2015-12-08 ENCOUNTER — Encounter: Payer: Self-pay | Admitting: Pharmacy Technician

## 2015-12-14 DIAGNOSIS — L97521 Non-pressure chronic ulcer of other part of left foot limited to breakdown of skin: Secondary | ICD-10-CM | POA: Diagnosis not present

## 2015-12-21 ENCOUNTER — Ambulatory Visit (INDEPENDENT_AMBULATORY_CARE_PROVIDER_SITE_OTHER): Payer: Self-pay | Admitting: *Deleted

## 2015-12-21 ENCOUNTER — Ambulatory Visit (INDEPENDENT_AMBULATORY_CARE_PROVIDER_SITE_OTHER): Payer: Medicare Other | Admitting: Pharmacist

## 2015-12-21 DIAGNOSIS — B182 Chronic viral hepatitis C: Secondary | ICD-10-CM

## 2015-12-21 DIAGNOSIS — Z23 Encounter for immunization: Secondary | ICD-10-CM

## 2015-12-21 NOTE — Progress Notes (Signed)
HPI: Yesenia Shepard is a 65 y.o. female presenting for HCV followup. She started Harvoni on 12/07/15. Denies side effects currently. Denies EtOH or IV drug abuse currently.  Is receiving methadone from a methadone clinic. Is also on atorvastatin 40 mg which has a drug interaction with Harvoni.    No results found for: HCVGENOTYPE, HEPCGENOTYPE  Allergies: Allergies  Allergen Reactions  . Codeine     Vitals:    Past Medical History: Past Medical History  Diagnosis Date  . Chronic pain   . Drug abuse     History of polysubstance abuse; currently on methadone.  . Depression   . History of chicken pox   . Arthritis   . UTI (urinary tract infection)     History of    Social History: Social History   Social History  . Marital Status: Legally Separated    Spouse Name: N/A  . Number of Children: N/A  . Years of Education: N/A   Social History Main Topics  . Smoking status: Current Some Day Smoker -- 1.00 packs/day for 30 years    Types: Cigarettes  . Smokeless tobacco: Never Used     Comment: Currently smoking 1/2 ppd  . Alcohol Use: No  . Drug Use: No     Comment: Hx of.  . Sexual Activity: Not Currently   Other Topics Concern  . Not on file   Social History Narrative    Labs: HEP B S AB (no units)  Date Value  10/18/2015 NEG   HEPATITIS B SURFACE AG (no units)  Date Value  10/18/2015 NEGATIVE   HCV AB (no units)  Date Value  08/19/2015 REACTIVE*    No results found for: HCVGENOTYPE, HEPCGENOTYPE  Hepatitis C RNA quantitative Latest Ref Rng 08/26/2015 08/19/2015  HCV Quantitative <15 IU/mL 622863(H) CANCELED  HCV Quantitative Log <1.18 log 10 5.79(H) CANCELED    AST (U/L)  Date Value  10/18/2015 34  04/19/2015 19  02/04/2015 23   ALT (U/L)  Date Value  10/18/2015 29  04/19/2015 16  02/04/2015 21   INR (no units)  Date Value  10/18/2015 1.17    CrCl: CrCl cannot be calculated (Unknown ideal weight.).  Fibrosis Score: F3/F4 as assessed by  Ultrasound Elastography on 12/06/15  Previous Treatment Regimen: N/A  Assessment: HCV: Started on Harvoni on 12/07/15 with some fibrosis F3/F4 on Ultrasound Elastography.  Likely Child-Pugh Class A.   Recommendations: Continue Harvoni 1 tablet by mouth once daily Counseled on importance of proper adherence Decrease Atorvastatin to 20 mg daily (1/2 of 40 mg tablet) until Harvoni is finished due to drug interaction Hep B (dose 2 of 3) vaccine today Followup HCV RNA in 2 weeks Followup with Dr Linus Salmons in August   [place], [place].D. [facility] Infect[facility] 12/21/2015, 10:30 AM

## 2015-12-21 NOTE — Patient Instructions (Signed)
Continue Havoni 1 tablet daily x 3 months Come back for labs in 2 weeks Follow up with Dr. Linus Salmons in August

## 2015-12-28 DIAGNOSIS — F4323 Adjustment disorder with mixed anxiety and depressed mood: Secondary | ICD-10-CM | POA: Diagnosis not present

## 2015-12-28 MED FILL — *HARVONI 90-400 MG TABLET: 90-400 | 28 days supply | Qty: 28 | Fill #1

## 2016-01-05 ENCOUNTER — Other Ambulatory Visit: Payer: Medicare Other

## 2016-01-05 ENCOUNTER — Ambulatory Visit: Payer: Medicare Other | Admitting: *Deleted

## 2016-01-05 DIAGNOSIS — F1910 Other psychoactive substance abuse, uncomplicated: Secondary | ICD-10-CM

## 2016-01-05 DIAGNOSIS — B182 Chronic viral hepatitis C: Secondary | ICD-10-CM

## 2016-01-05 LAB — COMPLETE METABOLIC PANEL WITH GFR
ALT: 13 U/L (ref 6–29)
AST: 19 U/L (ref 10–35)
Albumin: 3.6 g/dL (ref 3.6–5.1)
Alkaline Phosphatase: 54 U/L (ref 33–130)
BUN: 14 mg/dL (ref 7–25)
CO2: 32 mmol/L — ABNORMAL HIGH (ref 20–31)
Calcium: 9.2 mg/dL (ref 8.6–10.4)
Chloride: 101 mmol/L (ref 98–110)
Creat: 0.76 mg/dL (ref 0.50–0.99)
GFR, Est African American: >89 mL/min (ref >=60)
GFR, Est Non African American: 83 mL/min (ref >=60)
Glucose, Bld: 97 mg/dL (ref 65–99)
Potassium: 4.5 mmol/L (ref 3.5–5.3)
Sodium: 139 mmol/L (ref 135–146)
Total Bilirubin: 0.4 mg/dL (ref 0.2–1.2)
Total Protein: 6.3 g/dL (ref 6.1–8.1)

## 2016-01-05 NOTE — BH Specialist Note (Signed)
Counselor met with Yesenia Shepard today as a warm hand off from lab.  Apparently, patient was tearful during her lab appointment and said she felt like she needed to talk to someone.  Counselor inquired with patient as to what was going on that was causing her to be tearful.  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  Counselor educated patient about boundaries and taking time to take care of herself.  Counselor provided support and encouragement for patient.  Counselor recommended that patient seek counseling services in order to process what all is going on in hr life right now.   Counselor explained to patient that she can not meet with her on an ongoing basis because of not having the diagnosis of HIV. However, counselor did provide patient resources for counseling services near her residence.   Rolena Infante, MC, LPC [facility]

## 2016-01-06 LAB — HEPATITIS C RNA QUANTITATIVE: HCV Quantitative: NOT DETECTED IU/mL (ref <15)

## 2016-01-24 MED FILL — *HARVONI 90-400 MG TABLET: 90-400 | 28 days supply | Qty: 28 | Fill #2

## 2016-01-30 DIAGNOSIS — L97521 Non-pressure chronic ulcer of other part of left foot limited to breakdown of skin: Secondary | ICD-10-CM | POA: Diagnosis not present

## 2016-02-09 ENCOUNTER — Encounter: Payer: Self-pay | Admitting: Internal Medicine

## 2016-02-09 ENCOUNTER — Ambulatory Visit (INDEPENDENT_AMBULATORY_CARE_PROVIDER_SITE_OTHER): Payer: Medicare Other | Admitting: Internal Medicine

## 2016-02-09 VITALS — BP 157/73 | Temp 98.3°F | Ht 66.0 in | Wt 134.0 lb

## 2016-02-09 DIAGNOSIS — F418 Other specified anxiety disorders: Secondary | ICD-10-CM

## 2016-02-09 DIAGNOSIS — B182 Chronic viral hepatitis C: Secondary | ICD-10-CM | POA: Diagnosis not present

## 2016-02-09 DIAGNOSIS — K7460 Unspecified cirrhosis of liver: Secondary | ICD-10-CM | POA: Diagnosis not present

## 2016-02-09 DIAGNOSIS — F32A Depression, unspecified: Secondary | ICD-10-CM

## 2016-02-09 DIAGNOSIS — F419 Anxiety disorder, unspecified: Secondary | ICD-10-CM

## 2016-02-09 DIAGNOSIS — F329 Major depressive disorder, single episode, unspecified: Secondary | ICD-10-CM

## 2016-02-09 NOTE — Progress Notes (Signed)
   Subjective:    Patient ID: Yesenia Shepard, female    DOB: 1951/05/01, 65 y.o.   MRN: [id number]  HPI Here for follow up of HCV  Has genotype 1a, elastography with F3/4, hepatitis A and B non immune and getting vaccine series.  Started on Harvoni 12/07/15.  Also on methadone and atorvastatin.  No myalgias. Has never been to GI.  Early viral load undetectable.  No headache, no fatigue.  Some depression, no SI.    Review of Systems  Constitutional: Negative for fatigue.  Gastrointestinal: Negative for diarrhea.  Skin: Negative for rash.  Neurological: Negative for dizziness, light-headedness and headaches.       Objective:   Physical Exam  Constitutional: She appears well-developed and well-nourished.  Eyes: No scleral icterus.  Cardiovascular: Normal rate, regular rhythm and normal heart sounds.   Skin: No rash noted.   SHX: methadone, remains drug free.         Assessment & Plan:

## 2016-02-09 NOTE — Assessment & Plan Note (Signed)
Doing well, will finish in two weeks and will check end of treatment lab.   RTC 4 months for [facility]

## 2016-02-09 NOTE — Assessment & Plan Note (Signed)
F3/4 on elastography.  Normal plts, normal albumin and LFTs.  Will refer to GI in [place] for consideration of EGD.   She will need [place] screening every 6 months, next due after her next visit in 3-4 months

## 2016-02-09 NOTE — Assessment & Plan Note (Signed)
Gave her information on counseling at [facility]

## 2016-02-14 ENCOUNTER — Telehealth: Payer: Self-pay | Admitting: *Deleted

## 2016-02-14 NOTE — Telephone Encounter (Signed)
**Note Yesenia-Identified via Obfuscation** Called and left patient a voice mail to return call. She has been scheduled an appt with [facility] for 03/15/16 at 8:15 AM. [phone], Yesenia Shepard.

## 2016-03-13 ENCOUNTER — Other Ambulatory Visit: Payer: Medicare Other

## 2016-03-21 DIAGNOSIS — F4323 Adjustment disorder with mixed anxiety and depressed mood: Secondary | ICD-10-CM | POA: Diagnosis not present

## 2016-04-05 DIAGNOSIS — E559 Vitamin D deficiency, unspecified: Secondary | ICD-10-CM | POA: Diagnosis not present

## 2016-04-05 DIAGNOSIS — R5383 Other fatigue: Secondary | ICD-10-CM | POA: Diagnosis not present

## 2016-04-05 DIAGNOSIS — R7309 Other abnormal glucose: Secondary | ICD-10-CM | POA: Diagnosis not present

## 2016-04-05 DIAGNOSIS — D510 Vitamin B12 deficiency anemia due to intrinsic factor deficiency: Secondary | ICD-10-CM | POA: Diagnosis not present

## 2016-04-05 DIAGNOSIS — F4322 Adjustment disorder with anxiety: Secondary | ICD-10-CM | POA: Diagnosis not present

## 2016-04-11 DIAGNOSIS — L97521 Non-pressure chronic ulcer of other part of left foot limited to breakdown of skin: Secondary | ICD-10-CM | POA: Diagnosis not present

## 2016-05-07 DIAGNOSIS — F4323 Adjustment disorder with mixed anxiety and depressed mood: Secondary | ICD-10-CM | POA: Diagnosis not present

## 2016-06-06 ENCOUNTER — Other Ambulatory Visit: Payer: Self-pay | Admitting: Family Medicine

## 2016-06-06 MED ORDER — ATORVASTATIN CALCIUM 40 MG PO TABS: 40.0000 mg | ORAL_TABLET | Freq: Every day | ORAL | 0 refills | Status: DC

## 2016-06-06 MED ORDER — LISINOPRIL 10 MG PO TABS: 10.0000 mg | ORAL_TABLET | Freq: Every day | ORAL | 3 refills | Status: DC

## 2016-06-06 NOTE — Telephone Encounter (Signed)
Pt has not had a updated lipid profile since 04/2015. Please advise?

## 2016-06-06 NOTE — Telephone Encounter (Signed)
Pt called needing a refill for atorvastatin (LIPITOR) 40 MG tablet and lisinopril (PRINIVIL,ZESTRIL) 10 MG tablet.  Pharmacy is [place], [place].  Call pt @ [phone]. Thank you!

## 2016-06-11 ENCOUNTER — Ambulatory Visit (INDEPENDENT_AMBULATORY_CARE_PROVIDER_SITE_OTHER): Payer: Medicare Other | Admitting: Internal Medicine

## 2016-06-11 ENCOUNTER — Telehealth: Payer: Self-pay | Admitting: *Deleted

## 2016-06-11 ENCOUNTER — Encounter: Payer: Self-pay | Admitting: Internal Medicine

## 2016-06-11 VITALS — BP 172/61 | HR 61 | Temp 98.0°F | Wt 142.0 lb

## 2016-06-11 DIAGNOSIS — Z23 Encounter for immunization: Secondary | ICD-10-CM | POA: Diagnosis not present

## 2016-06-11 DIAGNOSIS — F32A Depression, unspecified: Secondary | ICD-10-CM

## 2016-06-11 DIAGNOSIS — F419 Anxiety disorder, unspecified: Secondary | ICD-10-CM

## 2016-06-11 DIAGNOSIS — F329 Major depressive disorder, single episode, unspecified: Secondary | ICD-10-CM

## 2016-06-11 DIAGNOSIS — B182 Chronic viral hepatitis C: Secondary | ICD-10-CM

## 2016-06-11 DIAGNOSIS — F418 Other specified anxiety disorders: Secondary | ICD-10-CM

## 2016-06-11 DIAGNOSIS — K7460 Unspecified cirrhosis of liver: Secondary | ICD-10-CM

## 2016-06-11 NOTE — Assessment & Plan Note (Addendum)
hcc screening every 6 months.  I will schedule now  And in 6 months and her PCP can monitor after that.   Has received pneumovax and getting hepatitis A and B vaccine and completed today.

## 2016-06-11 NOTE — Assessment & Plan Note (Signed)
She was given information about counselors to help with her cope with daughters illness

## 2016-06-11 NOTE — Progress Notes (Signed)
   Subjective:    Patient ID: Yesenia Shepard, female    DOB: 06-24-1951, 65 y.o.   MRN: [id number]  HPI Here for follow up of HCV  Has genotype 1a, elastography with F3/4, hepatitis A and B non immune and getting vaccine series by her PCP.  Started on Harvoni 12/07/15 and completed over 4 months ago.  Also on methadone and atorvastatin.  Recent diagnosis of cancer in her daughter and she is dealing with that.  Did start on an antidepressant by her report and not helping.    Review of Systems  Constitutional: Negative for fatigue.  Gastrointestinal: Negative for diarrhea.  Skin: Negative for rash.  Neurological: Negative for dizziness, light-headedness and headaches.       Objective:   Physical Exam  Constitutional: She appears well-developed and well-nourished.  Eyes: No scleral icterus.  Cardiovascular: Normal rate, regular rhythm and normal heart sounds.   Skin: No rash noted.   SHX: methadone, remains drug free.         Assessment & Plan:

## 2016-06-11 NOTE — Assessment & Plan Note (Addendum)
SVR 12 today (actually 4 months) to confirm cure. Would be ideal for PCP to check again in 6-12 months to be sure (08.8% certainty with SVR 12)

## 2016-06-11 NOTE — Telephone Encounter (Signed)
Called patient and notified of appts for ultrasound at [facility] in [place] for 12/11/7 at 9:15 AM. Nothing to eat or drink after midnight. And a second ultrasound for 12/10/16 at 9:15 AM. Address [address], [address]

## 2016-06-12 LAB — HEPATITIS C RNA QUANTITATIVE: HCV Quantitative: NOT DETECTED IU/mL (ref <15)

## 2016-06-13 ENCOUNTER — Encounter: Payer: Self-pay | Admitting: *Deleted

## 2016-06-13 ENCOUNTER — Telehealth: Payer: Self-pay | Admitting: *Deleted

## 2016-06-13 DIAGNOSIS — F4323 Adjustment disorder with mixed anxiety and depressed mood: Secondary | ICD-10-CM | POA: Diagnosis not present

## 2016-06-13 NOTE — Telephone Encounter (Signed)
Patient notified and she was very happy to here this. She will follow up with her PCP in one year. Myrtis Hopping CMA

## 2016-06-13 NOTE — Telephone Encounter (Signed)
-----   Message from Thayer Headings, MD sent at 06/13/2016  9:33 AM EST ----- Please let her know her final HCV is negative confirming cure (though should check one last time with pcp in a year).  Thanks!

## 2016-06-18 ENCOUNTER — Ambulatory Visit
Admission: RE | Admit: 2016-06-18 | Discharge: 2016-06-18 | Disposition: A | Discharge status: Home / Self Care | Payer: Medicare Other | Source: Ambulatory Visit | Attending: Internal Medicine | Admitting: Internal Medicine

## 2016-06-18 DIAGNOSIS — K7689 Other specified diseases of liver: Secondary | ICD-10-CM | POA: Diagnosis not present

## 2016-06-18 DIAGNOSIS — Z9049 Acquired absence of other specified parts of digestive tract: Secondary | ICD-10-CM | POA: Diagnosis not present

## 2016-06-18 DIAGNOSIS — K7460 Unspecified cirrhosis of liver: Secondary | ICD-10-CM | POA: Insufficient documentation

## 2016-08-15 ENCOUNTER — Telehealth: Payer: Self-pay | Admitting: Family Medicine

## 2016-08-15 ENCOUNTER — Other Ambulatory Visit: Payer: Self-pay | Admitting: Family Medicine

## 2016-08-15 NOTE — Telephone Encounter (Signed)
Pt called about needing a refill for lisinopril (PRINIVIL,ZESTRIL) 10 MG tablet and atorvastatin (LIPITOR) 40 MG tablet   Pharmacy is [place], [place] [address]  Call pt @ [phone]. Thank you!

## 2016-08-16 ENCOUNTER — Other Ambulatory Visit: Payer: Self-pay

## 2016-08-16 MED ORDER — ATORVASTATIN CALCIUM 40 MG PO TABS: 40.0000 mg | ORAL_TABLET | Freq: Every day | ORAL | 0 refills | Status: DC

## 2016-08-16 MED ORDER — LISINOPRIL 10 MG PO TABS: 10.0000 mg | ORAL_TABLET | Freq: Every day | ORAL | 0 refills | Status: DC

## 2016-08-16 MED ORDER — LISINOPRIL 10 MG PO TABS
10.0000 mg | ORAL_TABLET | Freq: Every day | ORAL | 3 refills | Status: DC
Start: 2016-08-16 — End: 2016-08-16

## 2016-08-16 NOTE — Telephone Encounter (Signed)
Okay to refill x 1 month 

## 2016-08-16 NOTE — Telephone Encounter (Signed)
Done

## 2016-08-16 NOTE — Telephone Encounter (Signed)
No office visit scheduled  Last office visit 09/01/15

## 2016-09-03 ENCOUNTER — Ambulatory Visit: Payer: Medicare Other | Admitting: Family Medicine

## 2016-09-04 NOTE — Telephone Encounter (Signed)
Left message to call the office back. Pt will need to com to apt on Thursday to discuss her shoulder pain.

## 2016-09-04 NOTE — Telephone Encounter (Signed)
Pt lvm requesting referral to ortho for her shoulder. She did not leave any additional information. Please advise.

## 2016-09-06 ENCOUNTER — Ambulatory Visit (INDEPENDENT_AMBULATORY_CARE_PROVIDER_SITE_OTHER): Payer: Medicare Other | Admitting: Family Medicine

## 2016-09-06 ENCOUNTER — Encounter: Payer: Self-pay | Admitting: Family Medicine

## 2016-09-06 VITALS — BP 159/74 | HR 66 | Temp 98.6°F | Wt 142.0 lb

## 2016-09-06 DIAGNOSIS — M25511 Pain in right shoulder: Secondary | ICD-10-CM | POA: Diagnosis not present

## 2016-09-06 DIAGNOSIS — Z1239 Encounter for other screening for malignant neoplasm of breast: Secondary | ICD-10-CM

## 2016-09-06 MED ORDER — METHYLPREDNISOLONE ACETATE 80 MG/ML IJ SUSP
80.0000 mg | Freq: Once | INTRAMUSCULAR | Status: AC
  Administered 2016-09-06: 80 mg via INTRA_ARTICULAR

## 2016-09-06 NOTE — Progress Notes (Signed)
Subjective:  Patient ID: Yesenia Shepard, female    DOB: 25-Apr-1951  Age: 66 y.o. MRN: [id number]  CC: Shoulder pain  HPI:  66 year old female with a complicated past medical history presents with complaints of right shoulder pain.  Patient reports a two-month history of right shoulder pain. Started after she was pulling a garbage can. Moderate to severe. Has continued to persist. Is worse with activity. Better at rest. No medications or interventions tried. Associated decreased range of motion. No other associated symptoms. No other complaints or concerns at this time.  Social Hx   Social History   Social History  . Marital status: Legally Separated    Spouse name: N/A  . Number of children: N/A  . Years of education: N/A   Social History Main Topics  . Smoking status: Current Some Day Smoker    Packs/day: 1.00    Years: 30.00    Types: Cigarettes  . Smokeless tobacco: Never Used     Comment: Currently smoking 1/2 ppd  . Alcohol use No  . Drug use: No     Comment: Hx of.  . Sexual activity: Not Currently   Other Topics Concern  . None   Social History Narrative  . None   Review of Systems  Constitutional: Negative.   Musculoskeletal:       R shoulder pain.   Objective:  BP (!) 159/74   Pulse 66   Temp 98.6 F (37 C) (Oral)   Wt 142 lb (64.4 kg)   SpO2 97%   BMI 22.92 kg/m   BP/Weight 09/06/2016 22/08/9796 03/10/1193  Systolic BP 174 081 448  Diastolic BP 74 61 73  Wt. (Lbs) 142 142 134  BMI 22.92 22.92 21.63   Physical Exam  Constitutional: She is oriented to person, place, and time. She appears well-developed. No distress.  Pulmonary/Chest: Effort normal.  Musculoskeletal:  Shoulder: Right Inspection reveals no abnormalities, atrophy or asymmetry. Palpation - unremarkable. ROM decreased particularly in flexion. Rotator cuff strength 4/5 due to pain. + Empty can and + [place]. Painful arch.   Neurological: She is alert and oriented to person, place,  and time.  Psychiatric:  Flat affect, depressed mood.  Vitals reviewed.  Lab Results  Component Value Date   WBC 5.0 10/18/2015   HGB 13.6 10/18/2015   HCT 41.8 10/18/2015   PLT 266 10/18/2015   GLUCOSE 97 01/05/2016   CHOL 113 05/03/2015   TRIG 45 05/03/2015   HDL 47 05/03/2015   LDLCALC 57 05/03/2015   ALT 13 01/05/2016   AST 19 01/05/2016   NA 139 01/05/2016   K 4.5 01/05/2016   CL 101 01/05/2016   CREATININE 0.76 01/05/2016   BUN 14 01/05/2016   CO2 32 (H) 01/05/2016   TSH 0.50 02/04/2015   INR 1.17 10/18/2015   HGBA1C 6.5 (H) 05/03/2015    Assessment & Plan:   Problem List Items Addressed This Visit    Right shoulder pain    New problem. Rotator cuff tendinopathy versus subacromial bursitis. Treating with Steroid injection.  Procedure: Subacromial bursa injection Consent signed and scanned into record. Medication: 80 mg Depo Medrol & 4 mL of Lidocaine 1% w/o Epi. Preparation: area cleansed with alcohol x 3 Time Out taken  Injection  Landmarks identified Above medication injected using a standard posterior approach. Patient tolerated well.         Other Visit Diagnoses    Screening for breast cancer    -  Primary  Follow-up: Return in about 1 month (around 10/07/2016).  [place]

## 2016-09-06 NOTE — Assessment & Plan Note (Signed)
New problem. Rotator cuff tendinopathy versus subacromial bursitis. Treating with Steroid injection.  Procedure: Subacromial bursa injection Consent signed and scanned into record. Medication: 80 mg Depo Medrol & 4 mL of Lidocaine 1% w/o Epi. Preparation: area cleansed with alcohol x 3 Time Out taken  Injection  Landmarks identified Above medication injected using a standard posterior approach. Patient tolerated well.

## 2016-09-06 NOTE — Progress Notes (Signed)
Pre visit review using our clinic review tool, if applicable. No additional management support is needed unless otherwise documented below in the visit note. 

## 2016-09-06 NOTE — Patient Instructions (Addendum)
Follow up in 1 month.  Take care  Dr. Lacinda Axon

## 2016-09-07 ENCOUNTER — Telehealth: Payer: Self-pay | Admitting: Family Medicine

## 2016-09-07 NOTE — Telephone Encounter (Signed)
Ok

## 2016-09-07 NOTE — Telephone Encounter (Signed)
I have not called pt or tried to call.

## 2016-09-07 NOTE — Telephone Encounter (Signed)
Pt called back returning a call. Thank you!  Call pt @ [phone]

## 2016-09-13 ENCOUNTER — Ambulatory Visit (INDEPENDENT_AMBULATORY_CARE_PROVIDER_SITE_OTHER): Payer: Medicare Other

## 2016-09-13 VITALS — BP 128/62 | HR 64 | Temp 98.4°F | Resp 14 | Ht 66.0 in | Wt 139.8 lb

## 2016-09-13 DIAGNOSIS — Z0000 Encounter for general adult medical examination without abnormal findings: Secondary | ICD-10-CM

## 2016-09-13 NOTE — Patient Instructions (Addendum)
Yesenia Shepard , Thank you for taking time to come for your Medicare Wellness Visit. I appreciate your ongoing commitment to your health goals. Please review the following plan we discussed and let me know if I can assist you in the future.   Have a great day!  These are the goals we discussed: Goals    . Increase water intake          Fill and finish the entire cup of water when taking medication       This is a list of the screening recommended for you and due dates:  Health Maintenance  Topic Date Due  . Eye exam for diabetics  06/29/1960  . Tetanus Vaccine  06/29/1969  . Cologuard (Stool DNA test)  06/29/2000  . DEXA scan (bone density measurement)  06/30/2015  . Hemoglobin A1C  11/01/2015  . Mammogram  08/14/2016  . Complete foot exam   08/18/2016  . Pneumonia vaccines (2 of 2 - PPSV23) 08/18/2016  . Flu Shot  Completed  .  Hepatitis C: One time screening is recommended by Center for Disease Control  (CDC) for  adults born from [birth year] through 1966.   Completed     Bone Densitometry Bone densitometry is an imaging test that uses a special X-ray to measure the amount of calcium and other minerals in your bones (bone density). This test is also known as a bone mineral density test or dual-energy X-ray absorptiometry (DXA). The test can measure bone density at your hip and your spine. It is similar to having a regular X-ray. You may have this test to:  Diagnose a condition that causes weak or thin bones (osteoporosis).  Predict your risk of a broken bone (fracture).  Determine how well osteoporosis treatment is working. Tell a health care provider about:  Any allergies you have.  All medicines you are taking, including vitamins, herbs, eye drops, creams, and over-the-counter medicines.  Any problems you or family members have had with anesthetic medicines.  Any blood disorders you have.  Any surgeries you have had.  Any medical conditions you have.  Possibility of  pregnancy.  Any other medical test you had within the previous 14 days that used contrast material. What are the risks? Generally, this is a safe procedure. However, problems can occur and may include the following:  This test exposes you to a very small amount of radiation.  The risks of radiation exposure may be greater to unborn children. What happens before the procedure?  Do not take any calcium supplements for 24 hours before having the test. You can otherwise eat and drink what you usually do.  Take off all metal jewelry, eyeglasses, dental appliances, and any other metal objects. What happens during the procedure?  You may lie on an exam table. There will be an X-ray generator below you and an imaging device above you.  Other devices, such as boxes or braces, may be used to position your body properly for the scan.  You will need to lie still while the machine slowly scans your body.  The images will show up on a computer monitor. What happens after the procedure? You may need more testing at a later time. This information is not intended to replace advice given to you by your health care provider. Make sure you discuss any questions you have with your health care provider. Document Released: 07/17/2004 Document Revised: 12/01/2015 Document Reviewed: 12/03/2013 Elsevier Interactive Patient Education  2017 [employer].  Mammogram A mammogram is an X-ray of the breasts that is done to check for abnormal changes. This procedure can screen for and detect any changes that may suggest breast cancer. A mammogram can also identify other changes and variations in the breast, such as:  Inflammation of the breast tissue (mastitis).  An infected area that contains a collection of pus (abscess).  A fluid-filled sac (cyst).  Fibrocystic changes. This is when breast tissue becomes denser, which can make the tissue feel rope-like or uneven under the skin.  Tumors that are not  cancerous (benign). Tell a health care provider about:  Any allergies you have.  If you have breast implants.  If you have had previous breast disease, biopsy, or surgery.  If you are breastfeeding.  Any possibility that you could be pregnant, if this applies.  If you are younger than age 25.  If you have a family history of breast cancer. What are the risks? Generally, this is a safe procedure. However, problems may occur, including:  Exposure to radiation. Radiation levels are very low with this test.  The results being misinterpreted.  The need for further tests.  The inability of the mammogram to detect certain cancers. What happens before the procedure?  Schedule your test about 1-2 weeks after your menstrual period. This is usually when your breasts are the least tender.  If you have had a mammogram done at a different facility in the past, get the mammogram X-rays or have them sent to your current exam facility in order to compare them.  Wash your breasts and under your arms the day of the test.  Do not wear deodorants, perfumes, lotions, or powders anywhere on your body on the day of the test.  Remove any jewelry from your neck.  Wear clothes that you can change into and out of easily. What happens during the procedure?  You will undress from the waist up and put on a gown.  You will stand in front of the X-ray machine.  Each breast will be placed between two plastic or glass plates. The plates will compress your breast for a few seconds. Try to stay as relaxed as possible during the procedure. This does not cause any harm to your breasts and any discomfort you feel will be very brief.  X-rays will be taken from different angles of each breast. The procedure may vary among health care providers and hospitals. What happens after the procedure?  The mammogram will be examined by a specialist (radiologist).  You may need to repeat certain parts of the test,  depending on the quality of the images. This is commonly done if the radiologist needs a better view of the breast tissue.  Ask when your test results will be ready. Make sure you get your test results.  You may resume your normal activities. This information is not intended to replace advice given to you by your health care provider. Make sure you discuss any questions you have with your health care provider. Document Released: 06/22/2000 Document Revised: 11/28/2015 Document Reviewed: 09/03/2014 Elsevier Interactive Patient Education  2017 [employer].

## 2016-09-13 NOTE — Progress Notes (Signed)
Care was provided under my supervision. I agree with the management as indicated in the note.  Lambert Jeanty DO  

## 2016-09-13 NOTE — Progress Notes (Signed)
Subjective:   MITTIE KNITTEL is a 66 y.o. female who presents for an Initial Medicare Annual Wellness Visit.  Review of Systems    No ROS.  Medicare Wellness Visit.  Cardiac Risk Factors include: advanced age (>64men, >1 women);hypertension;diabetes mellitus     Objective:    Today's Vitals   09/13/16 1304  BP: 128/62  Pulse: 64  Resp: 14  Temp: 98.4 F (36.9 C)  TempSrc: Oral  SpO2: 97%  Weight: 139 lb 12.8 oz (63.4 kg)  Height: 5\' 6"  (1.676 m)   Body mass index is 22.56 kg/m.   Current Medications (verified) Outpatient Encounter Prescriptions as of 09/13/2016  Medication Sig  . aspirin 81 MG tablet Take 81 mg by mouth daily.  Marland Kitchen atorvastatin (LIPITOR) 40 MG tablet Take 1 tablet (40 mg total) by mouth daily.  . clonazePAM (KLONOPIN) 1 MG tablet Take 2 mg by mouth 2 (two) times daily.   . clopidogrel (PLAVIX) 75 MG tablet Take 75 mg by mouth daily.   Marland Kitchen lisinopril (PRINIVIL,ZESTRIL) 10 MG tablet Take 1 tablet (10 mg total) by mouth daily.  . methadone (DOLOPHINE) 10 MG tablet Take 120 mg by mouth daily.   . silver sulfADIAZINE (SILVADENE) 1 % cream   . traZODone (DESYREL) 100 MG tablet Take 100 mg by mouth at bedtime.   No facility-administered encounter medications on file as of 09/13/2016.     Allergies (verified) Codeine   History: Past Medical History:  Diagnosis Date  . Arthritis   . Chronic pain   . Depression   . Drug abuse    History of polysubstance abuse; currently on methadone.  Marland Kitchen History of chicken pox   . UTI (urinary tract infection)    History of   Past Surgical History:  Procedure Laterality Date  . ABDOMINAL HYSTERECTOMY  1989  . CHOLECYSTECTOMY  1987  . PERIPHERAL VASCULAR CATHETERIZATION Left 05/04/2015   Procedure: Lower Extremity Angiography;  Surgeon: Katha Cabal, MD;  Location: [facility];  Service: Cardiovascular;  Laterality: Left;  . PERIPHERAL VASCULAR CATHETERIZATION Left 05/04/2015   Procedure: Lower Extremity  Intervention;  Surgeon: Katha Cabal, MD;  Location: [facility];  Service: Cardiovascular;  Laterality: Left;  . TONSILLECTOMY AND ADENOIDECTOMY  1971   Family History  Problem Relation Age of Onset  . Arthritis Mother   . Mental illness Mother     depression  . Diabetes Mother   . Cancer Mother     pancreatic  . Cancer Father     colon  . Heart disease Father   . Diabetes Father   . Cancer Daughter     lung   Social History   Occupational History  . Not on file.   Social History Main Topics  . Smoking status: Current Some Day Smoker    Packs/day: 1.00    Years: 30.00    Types: Cigarettes  . Smokeless tobacco: Never Used     Comment: Currently smoking 1/2 ppd  . Alcohol use No  . Drug use: No     Comment: Hx of.  . Sexual activity: Not Currently    Tobacco Counseling Ready to quit: Not Answered Counseling given: Not Answered   Activities of Daily Living In your present state of health, do you have any difficulty performing the following activities: 09/13/2016 06/11/2016  Hearing? N N  Vision? N N  Difficulty concentrating or making decisions? Y N  Walking or climbing stairs? N N  Dressing  or bathing? N N  Doing errands, shopping? N N  Preparing Food and eating ? N -  Using the Toilet? N -  In the past six months, have you accidently leaked urine? N -  Do you have problems with loss of bowel control? N -  Managing your Medications? N -  Managing your Finances? N -  Housekeeping or managing your Housekeeping? N -  Some recent data might be hidden    Immunizations and Health Maintenance Immunization History  Administered Date(s) Administered  . Hepatitis A, Adult 11/09/2015, 12/21/2015, 06/11/2016  . Hepatitis B 06/11/2016  . Hepatitis B, adult 11/09/2015, 12/21/2015  . Influenza-Unspecified 03/09/2016  . Pneumococcal Conjugate-13 08/19/2015   Health Maintenance Due  Topic Date Due  . OPHTHALMOLOGY EXAM  06/29/1960  . TETANUS/TDAP   06/29/1969  . Fecal DNA (Cologuard)  06/29/2000  . DEXA SCAN  06/30/2015  . HEMOGLOBIN A1C  11/01/2015  . MAMMOGRAM  08/14/2016  . FOOT EXAM  08/18/2016  . PNA vac Low Risk Adult (2 of 2 - PPSV23) 08/18/2016    Patient Care Team: Coral Spikes, DO as PCP - General (Family Medicine)  Indicate any recent Medical Services you may have received from other than [facility] providers in the past year (date may be approximate).     Assessment:   This is a routine wellness examination for Susann. The goal of the wellness visit is to assist the patient how to close the gaps in care and create a preventative care plan for the patient.   Osteoporosis risk reviewed.  Medications reviewed; taking without issues or barriers.  Safety issues reviewed; smoke detectors in the home. No firearms in the home.  Wears seatbelts when driving or riding with others. Patient does wear sunscreen or protective clothing when in direct sunlight. No violence in the home.  Patient is alert, normal appearance, oriented to person/place/and time. Correctly identified the president of the [place], recall of 3/3 objects, and performing simple calculations.  Patient displays appropriate judgement and can read correct time from watch face.  No new identified risk were noted.  No failures at ADL's or IADL's.   BMI- discussed the importance of a healthy diet, water intake and exercise. Educational material provided.   HTN- followed by PCP.  Sleep patterns- Sleeps 5-6 hours at night.  Wakes feeling rested.  Mammogram and Dexa Scan previously ordered; follow as directed.  Educational material provided.  TDAP vaccine deferred per patient preference.  Follow up with insurance.  Educational material provided.  PNA 23 vaccine deferred per patient request.  Educational material provided.  Patient Concerns: None at this time. Follow up with PCP as needed.  Hearing/Vision screen Hearing Screening Comments: Patient is able to  hear conversational tones without difficulty.  No issues reported.   Vision Screening Comments: Followed by Dr. Karlyn Agee Wears corrective lenses Cataract extraction, L eye Visual acuity not assessed per patient preference since they have regular follow up with the ophthalmologist  Dietary issues and exercise activities discussed: Current Exercise Habits: Home exercise routine, Type of exercise: walking, Time (Minutes): 10, Frequency (Times/Week): 7, Weekly Exercise (Minutes/Week): 70, Intensity: Mild  Goals    . Increase water intake          Fill and finish the entire cup of water when taking medication      Depression Screen PHQ 2/9 Scores 09/13/2016 06/11/2016 02/09/2016  PHQ - 2 Score 6 6 3   PHQ- 9 Score 11 12 7     Fall  Risk Fall Risk  09/13/2016 06/11/2016 02/09/2016  Falls in the past year? No Yes Yes  Number falls in past yr: - 1 2 or more  Injury with Fall? - No No  Risk Factor Category  - - High Fall Risk  Follow up - Falls evaluation completed;Education provided;Falls prevention discussed Falls evaluation completed    Cognitive Function:        Screening Tests Health Maintenance  Topic Date Due  . OPHTHALMOLOGY EXAM  06/29/1960  . TETANUS/TDAP  06/29/1969  . Fecal DNA (Cologuard)  06/29/2000  . DEXA SCAN  06/30/2015  . HEMOGLOBIN A1C  11/01/2015  . MAMMOGRAM  08/14/2016  . FOOT EXAM  08/18/2016  . PNA vac Low Risk Adult (2 of 2 - PPSV23) 08/18/2016  . INFLUENZA VACCINE  Completed  . Hepatitis C Screening  Completed      Plan:   End of life planning; Advanced aging; Advanced directives discussed.  No HCPOA/Living Will.  Additional information declined at this time.  Medicare Attestation I have personally reviewed: The patient's medical and social history Their use of alcohol, tobacco or illicit drugs Their current medications and supplements The patient's functional ability including ADLs,fall risks, home safety risks, cognitive, and hearing and visual  impairment Diet and physical activities Evidence for depression   The patient's weight, height, BMI, and visual acuity have been recorded in the chart.  I have made referrals and provided education to the patient based on review of the above and I have provided the patient with a written personalized care plan for preventive services.    During the course of the visit, Corry was educated and counseled about the following appropriate screening and preventive services:   Vaccines to include Pneumoccal, Influenza, Hepatitis B, Td, Zostavax, HCV  Colorectal cancer screening-cologuard previously ordered; follow as directed  Bone density screening-educational material provided  Glaucoma- annual eye exam  Mammography-educational material provided  Nutrition counseling  Smoking cessation counseling-not ready to quit, educational material provided  Patient Instructions (the written plan) were given to the patient.    Varney Biles, LPN   02/11/7618

## 2016-09-24 ENCOUNTER — Other Ambulatory Visit: Payer: Self-pay | Admitting: Family Medicine

## 2016-09-27 ENCOUNTER — Ambulatory Visit (INDEPENDENT_AMBULATORY_CARE_PROVIDER_SITE_OTHER): Payer: Medicare Other | Admitting: Family Medicine

## 2016-09-27 ENCOUNTER — Encounter: Payer: Self-pay | Admitting: Family Medicine

## 2016-09-27 VITALS — BP 146/82 | HR 70 | Temp 98.2°F | Wt 139.4 lb

## 2016-09-27 DIAGNOSIS — I10 Essential (primary) hypertension: Secondary | ICD-10-CM

## 2016-09-27 DIAGNOSIS — E119 Type 2 diabetes mellitus without complications: Secondary | ICD-10-CM

## 2016-09-27 DIAGNOSIS — E785 Hyperlipidemia, unspecified: Secondary | ICD-10-CM

## 2016-09-27 DIAGNOSIS — K7460 Unspecified cirrhosis of liver: Secondary | ICD-10-CM | POA: Diagnosis not present

## 2016-09-27 DIAGNOSIS — M25511 Pain in right shoulder: Secondary | ICD-10-CM

## 2016-09-27 NOTE — Assessment & Plan Note (Signed)
Stable. Continue Lisinopril.  

## 2016-09-27 NOTE — Assessment & Plan Note (Signed)
Persistent, severe. Arranging MRI.

## 2016-09-27 NOTE — Assessment & Plan Note (Signed)
Stable. Labs today. Continue Lipitor.

## 2016-09-27 NOTE — Progress Notes (Signed)
Pre visit review using our clinic review tool, if applicable. No additional management support is needed unless otherwise documented below in the visit note. 

## 2016-09-27 NOTE — Progress Notes (Signed)
Subjective:  Patient ID: Yesenia Shepard, female    DOB: 03-19-1951  Age: 66 y.o. MRN: [id number]  CC: Follow up  HPI:  66 year old female with DM 2, peripheral vascular disease status post angioplasty, hypertension, hyperlipidemia, hepatitis C with cirrhosis status post treatment with [facility] presents for follow up.  R shoulder pain  Patient continues to have severe right shoulder pain.  She had no improvement with recent injection.  Pain is severe. Associated weakness and decreased range of motion.  She is unsure what she should do regarding this at this time. Would like to discuss this today.  DM-2  Patient concerned about her sugars.  She is currently only doing dietary control.  Last A1c was in 2016 and was 6.5.  She is concerned that she may need medication.  She would like to discuss this today.  HLD  Stable on Lipitor.  Needs labs today.  HTN  Fairly well controlled on Lisinopril (given age and severe pain).   Social Hx   Social History   Social History  . Marital status: Legally Separated    Spouse name: N/A  . Number of children: N/A  . Years of education: N/A   Social History Main Topics  . Smoking status: Current Some Day Smoker    Packs/day: 1.00    Years: 30.00    Types: Cigarettes  . Smokeless tobacco: Never Used     Comment: Currently smoking 1/2 ppd  . Alcohol use No  . Drug use: No     Comment: Hx of.  . Sexual activity: Not Currently   Other Topics Concern  . None   Social History Narrative  . None    Review of Systems  Constitutional: Negative.   Musculoskeletal:       R shoulder pain.   Objective:  BP (!) 146/82   Pulse 70   Temp 98.2 F (36.8 C) (Oral)   Wt 139 lb 6 oz (63.2 kg)   SpO2 93%   BMI 22.50 kg/m   BP/Weight 09/27/2016 10/07/2876 12/14/6718  Systolic BP 947 096 283  Diastolic BP 82 62 74  Wt. (Lbs) 139.38 139.8 142  BMI 22.5 22.56 22.92    Physical Exam  Constitutional: She is oriented to person,  place, and time. She appears well-developed. No distress.  Cardiovascular: Normal rate and regular rhythm.   Musculoskeletal:  Shoulder:  Inspection reveals no abnormalities, atrophy or asymmetry. Palpation is normal with no tenderness over AC joint or bicipital groove. ROM is decreased in flexion. Rotator cuff strength 4/5 secondary to pain. + Empty can and [place].   Neurological: She is alert and oriented to person, place, and time.  Psychiatric:  Flat affect.  Vitals reviewed.   Lab Results  Component Value Date   WBC 5.0 10/18/2015   HGB 13.6 10/18/2015   HCT 41.8 10/18/2015   PLT 266 10/18/2015   GLUCOSE 97 01/05/2016   CHOL 113 05/03/2015   TRIG 45 05/03/2015   HDL 47 05/03/2015   LDLCALC 57 05/03/2015   ALT 13 01/05/2016   AST 19 01/05/2016   NA 139 01/05/2016   K 4.5 01/05/2016   CL 101 01/05/2016   CREATININE 0.76 01/05/2016   BUN 14 01/05/2016   CO2 32 (H) 01/05/2016   TSH 0.50 02/04/2015   INR 1.17 10/18/2015   HGBA1C 6.5 (H) 05/03/2015    Assessment & Plan:   Problem List Items Addressed This Visit    DM type 2 (diabetes mellitus, type 2) (  [place])    Has been stable. Labs today.      Relevant Orders   Hemoglobin A1c   Comprehensive metabolic panel   Essential hypertension    Stable. Continue Lisinopril.      HLD (hyperlipidemia)    Stable. Labs today. Continue Lipitor.      Relevant Orders   Lipid panel   Hepatic cirrhosis ([place])   Relevant Orders   CBC   Right shoulder pain - Primary    Persistent, severe. Arranging MRI.      Relevant Orders   MR Shoulder Right Wo Contrast     Follow-up: 3-6 months.  [place]

## 2016-09-27 NOTE — Assessment & Plan Note (Signed)
Has been stable. Labs today.  

## 2016-09-27 NOTE — Patient Instructions (Signed)
We will call with your lab results and with the appt for the MRI.  Follow up in 3-6 months.  Take care  Dr. Lacinda Axon

## 2016-09-28 LAB — CBC
HCT: 42.8 % (ref 36.0–46.0)
Hemoglobin: 14.4 g/dL (ref 12.0–15.0)
MCHC: 33.6 g/dL (ref 30.0–36.0)
MCV: 92.7 fl (ref 78.0–100.0)
Platelets: 233 10*3/uL (ref 150.0–400.0)
RBC: 4.62 Mil/uL (ref 3.87–5.11)
RDW: 13.1 % (ref 11.5–15.5)
WBC: 6 10*3/uL (ref 4.0–10.5)

## 2016-09-28 LAB — COMPREHENSIVE METABOLIC PANEL
ALT: 19 U/L (ref 0–35)
AST: 22 U/L (ref 0–37)
Albumin: 4 g/dL (ref 3.5–5.2)
Alkaline Phosphatase: 50 U/L (ref 39–117)
BUN: 17 mg/dL (ref 6–23)
CO2: 31 mEq/L (ref 19–32)
Calcium: 9.3 mg/dL (ref 8.4–10.5)
Chloride: 103 mEq/L (ref 96–112)
Creatinine, Ser: 0.79 mg/dL (ref 0.40–1.20)
GFR: 77.33 mL/min (ref >60.00)
Glucose, Bld: 198 mg/dL — ABNORMAL HIGH (ref 70–99)
Potassium: 4.4 mEq/L (ref 3.5–5.1)
Sodium: 139 mEq/L (ref 135–145)
Total Bilirubin: 0.4 mg/dL (ref 0.2–1.2)
Total Protein: 6.9 g/dL (ref 6.0–8.3)

## 2016-09-28 LAB — LIPID PANEL
Cholesterol: 134 mg/dL (ref 0–200)
HDL: 54.1 mg/dL (ref >39.00)
LDL Cholesterol: 69 mg/dL (ref 0–99)
NonHDL: 79.88
Total CHOL/HDL Ratio: 2
Triglycerides: 55 mg/dL (ref 0.0–149.0)
VLDL: 11 mg/dL (ref 0.0–40.0)

## 2016-09-28 LAB — HEMOGLOBIN A1C: Hgb A1c MFr Bld: 6.2 % (ref 4.6–6.5)

## 2016-10-01 ENCOUNTER — Telehealth: Payer: Self-pay | Admitting: Family Medicine

## 2016-10-01 NOTE — Telephone Encounter (Signed)
Pt called requesting lab results. Please advise, thank you!  Call pt @ [phone]

## 2016-10-01 NOTE — Telephone Encounter (Signed)
Patient advised of results see labs for documentation dated 09/27/16

## 2016-10-11 ENCOUNTER — Ambulatory Visit: Payer: Medicare Other | Admitting: Family Medicine

## 2016-10-16 ENCOUNTER — Ambulatory Visit: Payer: Medicare Other

## 2016-10-19 ENCOUNTER — Telehealth: Payer: Self-pay | Admitting: *Deleted

## 2016-10-19 NOTE — Telephone Encounter (Signed)
Patient has up coming appointment to close gaps in quality metrics. 02/15/17

## 2016-10-28 ENCOUNTER — Other Ambulatory Visit (INDEPENDENT_AMBULATORY_CARE_PROVIDER_SITE_OTHER): Payer: Self-pay | Admitting: Vascular Surgery

## 2016-11-06 DIAGNOSIS — L97521 Non-pressure chronic ulcer of other part of left foot limited to breakdown of skin: Secondary | ICD-10-CM | POA: Diagnosis not present

## 2016-12-10 ENCOUNTER — Ambulatory Visit
Admission: RE | Admit: 2016-12-10 | Discharge: 2016-12-10 | Disposition: A | Discharge status: Home / Self Care | Payer: Medicare Other | Source: Ambulatory Visit | Attending: Internal Medicine | Admitting: Internal Medicine

## 2016-12-10 ENCOUNTER — Ambulatory Visit: Payer: Medicare Other

## 2016-12-10 DIAGNOSIS — K7460 Unspecified cirrhosis of liver: Secondary | ICD-10-CM

## 2016-12-12 ENCOUNTER — Ambulatory Visit: Admission: RE | Admit: 2016-12-12 | Payer: Medicare Other | Source: Ambulatory Visit

## 2016-12-14 ENCOUNTER — Telehealth: Payer: Self-pay | Admitting: Lab

## 2016-12-14 ENCOUNTER — Ambulatory Visit
Admission: RE | Admit: 2016-12-14 | Discharge: 2016-12-14 | Disposition: A | Discharge status: Home / Self Care | Payer: Medicare Other | Source: Ambulatory Visit | Attending: Family Medicine | Admitting: Family Medicine

## 2016-12-14 DIAGNOSIS — M7551 Bursitis of right shoulder: Secondary | ICD-10-CM | POA: Insufficient documentation

## 2016-12-14 DIAGNOSIS — M75121 Complete rotator cuff tear or rupture of right shoulder, not specified as traumatic: Secondary | ICD-10-CM | POA: Diagnosis not present

## 2016-12-14 DIAGNOSIS — M24111 Other articular cartilage disorders, right shoulder: Secondary | ICD-10-CM | POA: Diagnosis not present

## 2016-12-14 DIAGNOSIS — M7591 Shoulder lesion, unspecified, right shoulder: Secondary | ICD-10-CM | POA: Diagnosis not present

## 2016-12-14 DIAGNOSIS — M25511 Pain in right shoulder: Secondary | ICD-10-CM | POA: Diagnosis not present

## 2016-12-14 NOTE — Telephone Encounter (Signed)
Patient called to let us know that she had completed the MRI.  I explained to her that as soon as we get the report and the Dr. reviews it, we would reach out to her .  But if she had not heard anything by next week, she could call me back.  Her daughter passed away 2 weeks ago and  she has some other mental issues going on.  [facility] income.

## 2016-12-14 NOTE — Telephone Encounter (Signed)
Looks like she had a shoulder MRI done by her PCP.

## 2016-12-14 NOTE — Telephone Encounter (Signed)
Please advise 

## 2016-12-17 ENCOUNTER — Other Ambulatory Visit: Payer: Self-pay | Admitting: Family Medicine

## 2016-12-17 DIAGNOSIS — M75101 Unspecified rotator cuff tear or rupture of right shoulder, not specified as traumatic: Secondary | ICD-10-CM

## 2016-12-24 DIAGNOSIS — M75120 Complete rotator cuff tear or rupture of unspecified shoulder, not specified as traumatic: Secondary | ICD-10-CM | POA: Insufficient documentation

## 2016-12-24 DIAGNOSIS — M75121 Complete rotator cuff tear or rupture of right shoulder, not specified as traumatic: Secondary | ICD-10-CM | POA: Diagnosis not present

## 2017-01-01 DIAGNOSIS — M75121 Complete rotator cuff tear or rupture of right shoulder, not specified as traumatic: Secondary | ICD-10-CM | POA: Diagnosis not present

## 2017-01-03 ENCOUNTER — Other Ambulatory Visit: Payer: Self-pay | Admitting: Orthopedic Surgery

## 2017-01-03 ENCOUNTER — Telehealth (INDEPENDENT_AMBULATORY_CARE_PROVIDER_SITE_OTHER): Payer: Self-pay | Admitting: Vascular Surgery

## 2017-01-03 NOTE — Telephone Encounter (Signed)
[facility] IS HAVING SURGERY July 1, SHE WANTS TO KNOW IF ITS OK TO QUIT TAKING HER BLOOD THINNER 5 TO 7 DAYS BEFORE. SHE WANTS TO CHECK WITH DR [place] QUITS.

## 2017-01-03 NOTE — Telephone Encounter (Signed)
Spoke with the patient and informed her that once the doctor signs the paperwork I will fax it back to her orthopedic surgeon.

## 2017-01-10 ENCOUNTER — Encounter
Admission: RE | Admit: 2017-01-10 | Discharge: 2017-01-10 | Disposition: A | Discharge status: Home / Self Care | Payer: Medicare Other | Source: Ambulatory Visit | Attending: Orthopedic Surgery | Admitting: Orthopedic Surgery

## 2017-01-10 ENCOUNTER — Ambulatory Visit (INDEPENDENT_AMBULATORY_CARE_PROVIDER_SITE_OTHER): Payer: Medicare Other | Admitting: Vascular Surgery

## 2017-01-10 ENCOUNTER — Telehealth: Payer: Self-pay | Admitting: *Deleted

## 2017-01-10 DIAGNOSIS — Z01812 Encounter for preprocedural laboratory examination: Secondary | ICD-10-CM | POA: Insufficient documentation

## 2017-01-10 DIAGNOSIS — R001 Bradycardia, unspecified: Secondary | ICD-10-CM | POA: Insufficient documentation

## 2017-01-10 DIAGNOSIS — B182 Chronic viral hepatitis C: Secondary | ICD-10-CM | POA: Diagnosis not present

## 2017-01-10 DIAGNOSIS — Z01810 Encounter for preprocedural cardiovascular examination: Secondary | ICD-10-CM | POA: Diagnosis not present

## 2017-01-10 DIAGNOSIS — E785 Hyperlipidemia, unspecified: Secondary | ICD-10-CM | POA: Diagnosis not present

## 2017-01-10 DIAGNOSIS — M25511 Pain in right shoulder: Secondary | ICD-10-CM | POA: Diagnosis not present

## 2017-01-10 DIAGNOSIS — E119 Type 2 diabetes mellitus without complications: Secondary | ICD-10-CM | POA: Diagnosis not present

## 2017-01-10 DIAGNOSIS — I739 Peripheral vascular disease, unspecified: Secondary | ICD-10-CM | POA: Diagnosis not present

## 2017-01-10 DIAGNOSIS — I10 Essential (primary) hypertension: Secondary | ICD-10-CM

## 2017-01-10 HISTORY — DX: Peripheral vascular disease, unspecified: I73.9

## 2017-01-10 HISTORY — DX: Anxiety disorder, unspecified: F41.9

## 2017-01-10 HISTORY — DX: Essential (primary) hypertension: I10

## 2017-01-10 HISTORY — DX: Panic disorder (episodic paroxysmal anxiety): F41.0

## 2017-01-10 HISTORY — DX: Inflammatory liver disease, unspecified: K75.9

## 2017-01-10 HISTORY — DX: Prediabetes: R73.03

## 2017-01-10 LAB — CBC WITH DIFFERENTIAL/PLATELET
Basophils Absolute: 0 10*3/uL (ref 0–0.1)
Basophils Relative: 1 %
Eosinophils Absolute: 0.2 10*3/uL (ref 0–0.7)
Eosinophils Relative: 3 %
HCT: 38.3 % (ref 35.0–47.0)
Hemoglobin: 12.9 g/dL (ref 12.0–16.0)
Lymphocytes Relative: 28 %
Lymphs Abs: 1.7 10*3/uL (ref 1.0–3.6)
MCH: 31 pg (ref 26.0–34.0)
MCHC: 33.7 g/dL (ref 32.0–36.0)
MCV: 92 fL (ref 80.0–100.0)
Monocytes Absolute: 0.5 10*3/uL (ref 0.2–0.9)
Monocytes Relative: 8 %
Neutro Abs: 3.8 10*3/uL (ref 1.4–6.5)
Neutrophils Relative %: 60 %
Platelets: 196 10*3/uL (ref 150–440)
RBC: 4.16 MIL/uL (ref 3.80–5.20)
RDW: 13 % (ref 11.5–14.5)
WBC: 6.1 10*3/uL (ref 3.6–11.0)

## 2017-01-10 LAB — BASIC METABOLIC PANEL
Anion gap: 6 (ref 5–15)
BUN: 16 mg/dL (ref 6–20)
CO2: 31 mmol/L (ref 22–32)
Calcium: 8.9 mg/dL (ref 8.9–10.3)
Chloride: 102 mmol/L (ref 101–111)
Creatinine, Ser: 0.75 mg/dL (ref 0.44–1.00)
GFR calc Af Amer: >60 mL/min (ref >60)
GFR calc non Af Amer: >60 mL/min (ref >60)
Glucose, Bld: 109 mg/dL — ABNORMAL HIGH (ref 65–99)
Potassium: 4.1 mmol/L (ref 3.5–5.1)
Sodium: 139 mmol/L (ref 135–145)

## 2017-01-10 LAB — PROTIME-INR
INR: 1.06
Prothrombin Time: 13.8 seconds (ref 11.4–15.2)

## 2017-01-10 LAB — APTT: aPTT: 34 seconds (ref 24–36)

## 2017-01-10 NOTE — Patient Instructions (Signed)
Your procedure is scheduled on: January 16, 2017 [facility])  Report to Same Day Surgery 2nd floor medical mall ([place] Entrance-take elevator on left to 2nd floor.  Check in with surgery information desk.) To find out your arrival time please call [phone] between 1PM - 3PM on January 15, 2017 (TUESDAY)   Remember: Instructions that are not followed completely may result in serious medical risk, up to and including death, or upon the discretion of your surgeon and anesthesiologist your surgery may need to be rescheduled.    _x___ 1. Do not eat food or drink liquids after midnight. No gum chewing or hard candies                                __x__ 2. No Alcohol for 24 hours before or after surgery.   __x__3. No Smoking for 24 prior to surgery.   ____  4. Bring all medications with you on the day of surgery if instructed.    __x__ 5. Notify your doctor if there is any change in your medical condition     (cold, fever, infections).     Do not wear jewelry, make-up, hairpins, clips or nail polish.  Do not wear lotions, powders, or perfumes.   Do not shave 48 hours prior to surgery. Men may shave face and neck.  Do not bring valuables to the hospital.    [facility] is not responsible for any belongings or valuables.               Contacts, dentures or bridgework may not be worn into surgery.  Leave your suitcase in the car. After surgery it may be brought to your room.  For patients admitted to the hospital, discharge time is determined by your  treatment team                    Patients discharged the day of surgery will not be allowed to drive home.  You will need someone to drive you home and stay with you the night of your procedure.    Please read over the following fact sheets that you were given:   [facility] Preparing for Surgery and or MRSA Information   _x___ Take the following medications with a sip of water the morning of surgery :   1.Atorvastatin  2.  Klonopin  3. Lexapro  4.Lisinopril  ____Fleets enema or Magnesium Citrate as directed.   _x___ Use CHG Soap or sage wipes as directed on instruction sheet   ____ Use inhalers on the day of surgery and bring to hospital day of surgery  ____ Stop Metformin and Janumet 2 days prior to surgery.    ____ Take 1/2 of usual insulin dose the night before surgery and none on the morning surgery     _x___ Follow recommendations from Cardiologist, Pulmonologist or PCP regarding          stopping Aspirin, Coumadin, Plavix ,Eliquis, Effient, or Pradaxa, and Pletal. (Patient has an appointment with Dr. Delana Meyer today to discuss when to stop Aspirin and Plavix )  X____Stop Anti-inflammatories such as Advil, Aleve, Ibuprofen, Motrin, Naproxen, Naprosyn, Goodies powders or aspirin products one week before surgery.   OK to take Tylenol     _x___ Stop supplements until after surgery.  But may continue Vitamin D, Vitamin B, and multivitamin        ____ Bring C-Pap to the hospital.

## 2017-01-10 NOTE — Telephone Encounter (Signed)
Patient questioned if she will need to be seen by Dr.Cook prior to her rotater cuff surgery on July 11,2018. This surgery will preformed by [facility] , they also faxed over a form to have filled out by provider.  Pt contact [phone]

## 2017-01-11 NOTE — Telephone Encounter (Signed)
Appointment scheduled.

## 2017-01-13 NOTE — Progress Notes (Signed)
MRN : [id number]  Yesenia Shepard is a 66 y.o. (13-Dec-1950) female who presents with chief complaint of  Chief Complaint  Patient presents with  . Re-evaluation    Clearance for surgery  .  History of Present Illness:  The patient is seen for follow up evaluation of painful lower extremities and diminished pulses. Patient notes the pain is always associated with activity and is very consistent day today. Typically, the pain occurs at less than one block, progress is as activity continues to the point that the patient must stop walking. Resting including standing still for several minutes allowed resumption of the activity and the ability to walk a similar distance before stopping again. Uneven terrain and inclined shorten the distance. The pain has been progressive over the past several years.   The patient denies rest pain or dangling of an extremity off the side of the bed during the night for relief. No open wounds or sores at this time.  The patient's blood pressure has been stable and relatively well controlled. The patient denies amaurosis fugax or recent TIA symptoms. There are no recent neurological changes noted. The patient denies history of DVT, PE or superficial thrombophlebitis. The patient denies recent episodes of angina or shortness of breath.   Current Meds  Medication Sig  . aspirin 81 MG tablet Take 81 mg by mouth daily.  Marland Kitchen atorvastatin (LIPITOR) 40 MG tablet TAKE 1 TABLET BY MOUTH ONCE DAILY  . clonazePAM (KLONOPIN) 2 MG tablet Take 2 mg by mouth 3 (three) times daily as needed for anxiety.  . clopidogrel (PLAVIX) 75 MG tablet TAKE ONE TABLET BY MOUTH ONCE DAILY  . docusate sodium (COLACE) 100 MG capsule Take 300 mg by mouth daily.  Marland Kitchen escitalopram (LEXAPRO) 20 MG tablet Take 20 mg by mouth daily at 3 pm.  . folic acid (FOLVITE) 1 MG tablet Take 2 mg by mouth 2 (two) times daily.  Marland Kitchen HYDROcodone-acetaminophen (NORCO/VICODIN) 5-325 MG tablet Take 1 tablet by mouth 2  (two) times daily as needed for moderate pain.  Marland Kitchen ibuprofen (ADVIL,MOTRIN) 200 MG tablet Take 800 mg by mouth daily as needed for mild pain or moderate pain.   Marland Kitchen lisinopril (PRINIVIL,ZESTRIL) 10 MG tablet Take 1 tablet (10 mg total) by mouth daily.  . methadone (DOLOPHINE) 10 MG tablet Take 120 mg by mouth daily.   . polyethylene glycol (MIRALAX / GLYCOLAX) packet Take 17 g by mouth daily as needed for mild constipation.  . traZODone (DESYREL) 100 MG tablet Take 100 mg by mouth at bedtime.  . Vitamin D, Ergocalciferol, (DRISDOL) 50000 units CAPS capsule Take 50,000 Units by mouth every 7 (seven) days. Thursdays    Past Medical History:  Diagnosis Date  . Anxiety   . Arthritis   . Chronic pain   . Depression   . Drug abuse    History of polysubstance abuse; currently on methadone.  . Hepatitis    History of Hep "C"  . History of chicken pox   . Hypertension   . Panic attacks   . Peripheral vascular disease ([place])   . Pre-diabetes   . UTI (urinary tract infection)    History of    Past Surgical History:  Procedure Laterality Date  . ABDOMINAL HYSTERECTOMY  1989  . CHOLECYSTECTOMY  1987  . PERIPHERAL VASCULAR CATHETERIZATION Left 05/04/2015   Procedure: Lower Extremity Angiography;  Surgeon: Katha Cabal, MD;  Location: [facility];  Service: Cardiovascular;  Laterality: Left;  .  PERIPHERAL VASCULAR CATHETERIZATION Left 05/04/2015   Procedure: Lower Extremity Intervention;  Surgeon: Katha Cabal, MD;  Location: [facility];  Service: Cardiovascular;  Laterality: Left;  . TONSILLECTOMY AND ADENOIDECTOMY  1971    Social History Social History  Substance Use Topics  . Smoking status: Current Every Day Smoker    Packs/day: 1.00    Years: 30.00    Types: Cigarettes  . Smokeless tobacco: Never Used     Comment: Currently smoking 1/2 ppd  . Alcohol use No    Family History Family History  Problem Relation Age of Onset  . Arthritis Mother   .  Mental illness Mother        depression  . Diabetes Mother   . Cancer Mother        pancreatic  . Cancer Father        colon  . Heart disease Father   . Diabetes Father   . Cancer Daughter        lung    Allergies  Allergen Reactions  . Codeine Hives and Nausea And Vomiting     REVIEW OF SYSTEMS (Negative unless checked)  Constitutional: [] Weight loss  [] Fever  [] Chills Cardiac: [] Chest pain   [] Chest pressure   [] Palpitations   [] Shortness of breath when laying flat   [] Shortness of breath with exertion. Vascular:  [x] Pain in legs with walking   [] Pain in legs at rest  [] History of DVT   [] Phlebitis   [] Swelling in legs   [] Varicose veins   [] Non-healing ulcers Pulmonary:   [] Uses home oxygen   [] Productive cough   [] Hemoptysis   [] Wheeze  [] COPD   [] Asthma Neurologic:  [] Dizziness   [] Seizures   [] History of stroke   [] History of TIA  [] Aphasia   [] Vissual changes   [] Weakness or numbness in arm   [] Weakness or numbness in leg Musculoskeletal:   [] Joint swelling   [x] Joint pain   [] Low back pain Hematologic:  [] Easy bruising  [] Easy bleeding   [] Hypercoagulable state   [] Anemic Gastrointestinal:  [] Diarrhea   [] Vomiting  [] Gastroesophageal reflux/heartburn   [] Difficulty swallowing. Genitourinary:  [] Chronic kidney disease   [] Difficult urination  [] Frequent urination   [] Blood in urine Skin:  [] Rashes   [] Ulcers  Psychological:  [] History of anxiety   []  History of major depression.  Physical Examination  There were no vitals filed for this visit. There is no height or weight on file to calculate BMI. Gen: WD/WN, NAD Head: [place]/AT, No temporalis wasting.  Ear/Nose/Throat: Hearing grossly intact, nares w/o erythema or drainage Eyes: PER, EOMI, sclera nonicteric.  Neck: Supple, no large masses.   Pulmonary:  Good air movement, no audible wheezing bilaterally, no use of accessory muscles.  Cardiac: RRR, no JVD Vascular:  Vessel Right Left  Radial Palpable Palpable  Ulnar  Palpable Palpable  Brachial Palpable Palpable  Carotid Palpable Palpable  Femoral Palpable Palpable  Popliteal Not Palpable Not Palpable  PT Trace Palpable Trace Palpable  DP 1+ Palpable 1+ Palpable  Gastrointestinal: Non-distended. No guarding/no peritoneal signs.  Musculoskeletal: M/S 5/5 throughout.  No deformity or atrophy.  Neurologic: CN 2-12 intact. Symmetrical.  Speech is fluent. Motor exam as listed above. Psychiatric: Judgment intact, Mood & affect appropriate for pt's clinical situation. Dermatologic: No rashes or ulcers noted.  No changes consistent with cellulitis. Lymph : No lichenification or skin changes of chronic lymphedema.  CBC Lab Results  Component Value Date   WBC 6.1 01/10/2017   HGB 12.9 01/10/2017  HCT 38.3 01/10/2017   MCV 92.0 01/10/2017   PLT 196 01/10/2017    BMET    Component Value Date/Time   NA 139 01/10/2017 1033   NA 139 02/04/2015 1305   K 4.1 01/10/2017 1033   CL 102 01/10/2017 1033   CO2 31 01/10/2017 1033   GLUCOSE 109 (H) 01/10/2017 1033   BUN 16 01/10/2017 1033   BUN 10 02/04/2015 1305   CREATININE 0.75 01/10/2017 1033   CREATININE 0.76 01/05/2016 1524   CALCIUM 8.9 01/10/2017 1033   GFRNONAA >60 01/10/2017 1033   GFRNONAA 83 01/05/2016 1524   GFRAA >60 01/10/2017 1033   GFRAA >89 01/05/2016 1524   Estimated Creatinine Clearance: 62.2 mL/min (by C-G formula based on SCr of 0.75 mg/dL).  COAG Lab Results  Component Value Date   INR 1.06 01/10/2017   INR 1.17 10/18/2015    Radiology No results found.  Assessment/Plan 1. Peripheral vascular disease ([place])  Recommend:  The patient has evidence of atherosclerosis of the lower extremities with claudication.  The patient does not voice lifestyle limiting changes at this point in time.  Noninvasive studies do not suggest clinically significant change.  No invasive studies, angiography or surgery at this time The patient should continue walking and begin a more formal  exercise program.  The patient should continue antiplatelet therapy and aggressive treatment of the lipid abnormalities  No changes in the patient's medications at this time  The patient should continue wearing graduated compression socks 10-15 mmHg strength to control the mild edema.   - VAS US AORTA/IVC/ILIACS; Future - VAS [place] ABI WITH/WO TBI; Future  2. Essential hypertension Continue antihypertensive medications as already ordered, these medications have been reviewed and there are no changes at this time.   3. Chronic hepatitis C without hepatic coma (HCC) Continue antiviral medications as ordered by primary service, these medications have been reviewed and there are no changes at this time.   4. Type 2 diabetes mellitus without complication, without long-term current use of insulin (HCC) Continue hypoglycemic medications as already ordered, these medications have been reviewed and there are no changes at this time.  Hgb A1C to be monitored as already arranged by primary service   5. Hyperlipidemia, unspecified hyperlipidemia type Continue statin as ordered and reviewed, no changes at this time   6. Acute pain of right shoulder Patient is cleared from a vascular surgery standpoint for her shoulder surgery. She will stop her Plavix today and will follow up with me for review of her noninvasive studies.    Hortencia Pilar, MD  January 26, 2017 9:37 AM

## 2017-01-14 ENCOUNTER — Encounter: Payer: Self-pay | Admitting: Family Medicine

## 2017-01-14 ENCOUNTER — Ambulatory Visit (INDEPENDENT_AMBULATORY_CARE_PROVIDER_SITE_OTHER): Payer: Medicare Other | Admitting: Family Medicine

## 2017-01-14 VITALS — BP 132/78 | HR 66 | Temp 98.8°F | Resp 16 | Ht 66.0 in | Wt 138.2 lb

## 2017-01-14 DIAGNOSIS — Z01818 Encounter for other preprocedural examination: Secondary | ICD-10-CM | POA: Insufficient documentation

## 2017-01-14 LAB — POCT URINALYSIS DIPSTICK
Bilirubin, UA: NEGATIVE
Blood, UA: NEGATIVE
Glucose, UA: NEGATIVE
Ketones, UA: NEGATIVE
Leukocytes, UA: NEGATIVE
Nitrite, UA: NEGATIVE
Protein, UA: NEGATIVE
Spec Grav, UA: 1.02 (ref 1.010–1.025)
Urobilinogen, UA: 1 E.U./dL
pH, UA: 6 (ref 5.0–8.0)

## 2017-01-14 NOTE — Progress Notes (Signed)
Subjective:  Patient ID: Yesenia Shepard, female    DOB: 30-Sharmaine-1952  Age: 66 y.o. MRN: [id number]  CC: Pre operative exam  HPI:  66 year old female with a history of hepatitis C status post treatment with Harvoni, hepatic cirrhosis, peripheral vascular disease, hypertension, DM 2, hyperlipidemia presents for a preoperative exam.  Patient has recently seen orthopedics and has upcoming shoulder arthroscopy and rotator cuff repair. She is in need of a preoperative exam and studies requested by orthopedist. She states that she is doing well excluding severe pain of her right shoulder. She remains on methadone. She has a history of recreational drug use. She is still smoking. Her diabetes is well controlled. Lipids well controlled. No anemia. She denies significant shortness of breath or chest pain. Shoulder pain is severe and worse with range of motion. She is otherwise doing well. No other complaints at this time.  Social Hx   Social History   Social History  . Marital status: Legally Separated    Spouse name: N/A  . Number of children: N/A  . Years of education: N/A   Social History Main Topics  . Smoking status: Current Every Day Smoker    Packs/day: 1.00    Years: 30.00    Types: Cigarettes  . Smokeless tobacco: Never Used     Comment: Currently smoking 1/2 ppd  . Alcohol use No  . Drug use: No     Comment: Hx of.  . Sexual activity: Not Currently   Other Topics Concern  . None   Social History Narrative  . None    Review of Systems  Respiratory: Negative.   Cardiovascular: Negative.   Musculoskeletal:       Right shoulder pain.   Objective:  BP 132/78   Pulse 66   Temp 98.8 F (37.1 C) (Oral)   Resp 16   Ht 5\' 6"  (1.676 m)   Wt 138 lb 3.2 oz (62.7 kg)   SpO2 99%   BMI 22.31 kg/m   BP/Weight 01/14/2017 01/10/2017 11/02/621  Systolic BP 762 831 517  Diastolic BP 78 66 82  Wt. (Lbs) 138.2 135 139.38  BMI 22.31 22.47 22.5   Physical Exam  Constitutional: She is  oriented to person, place, and time.  Chronically ill-appearing female no acute distress.  Cardiovascular:  Bradycardia. Regular rhythm.  Pulmonary/Chest: Effort normal. She has no wheezes. She has no rales.  Neurological: She is alert and oriented to person, place, and time.  Psychiatric:  Flat affect.  Vitals reviewed.   Lab Results  Component Value Date   WBC 6.1 01/10/2017   HGB 12.9 01/10/2017   HCT 38.3 01/10/2017   PLT 196 01/10/2017   GLUCOSE 109 (H) 01/10/2017   CHOL 134 09/27/2016   TRIG 55.0 09/27/2016   HDL 54.10 09/27/2016   LDLCALC 69 09/27/2016   ALT 19 09/27/2016   AST 22 09/27/2016   NA 139 01/10/2017   K 4.1 01/10/2017   CL 102 01/10/2017   CREATININE 0.75 01/10/2017   BUN 16 01/10/2017   CO2 31 01/10/2017   TSH 0.50 02/04/2015   INR 1.06 01/10/2017   HGBA1C 6.2 09/27/2016    Assessment & Plan:   Problem List Items Addressed This Visit      Other   Preoperative examination - Primary    Patient presents for preoperative examination. EKG obtained as requested. Interpretation: Sinus bradycardia. No ST or T-wave changes. Unremarkable EKG. Has had recent laboratory studies which were at goal/unremarkable. Urinalysis negative.  She is at low risk for surgery based on NSQIP (0.3% for cardiac complication). Her vascular surgeon discontinued her Plavix. She may proceed with surgery at the discretion of the orthopedist based on low risk.      Relevant Orders   POCT Urinalysis Dipstick (Completed)      Follow-up: PRN  [place]

## 2017-01-14 NOTE — Patient Instructions (Signed)
I will send in the form for your surgery.  Best of luck and I will see you afterwards.  Take care  Dr. Lacinda Axon

## 2017-01-14 NOTE — Assessment & Plan Note (Signed)
Patient presents for preoperative examination. EKG obtained as requested. Interpretation: Sinus bradycardia. No ST or T-wave changes. Unremarkable EKG. Has had recent laboratory studies which were at goal/unremarkable. Urinalysis negative. She is at low risk for surgery based on NSQIP (0.3% for cardiac complication). Her vascular surgeon discontinued her Plavix. She may proceed with surgery at the discretion of the orthopedist based on low risk.

## 2017-01-15 ENCOUNTER — Ambulatory Visit: Payer: Medicare Other | Admitting: Family Medicine

## 2017-01-15 MED ORDER — CEFAZOLIN SODIUM-DEXTROSE 2-4 GM/100ML-% IV SOLN
2.0000 g | INTRAVENOUS | Status: AC
  Administered 2017-01-16: 2 g via INTRAVENOUS

## 2017-01-16 ENCOUNTER — Ambulatory Visit: Payer: Medicare Other | Admitting: Anesthesiology

## 2017-01-16 ENCOUNTER — Encounter
Admission: RE | Disposition: A | Discharge status: Home / Self Care | Payer: Self-pay | Source: Ambulatory Visit | Attending: Orthopedic Surgery

## 2017-01-16 ENCOUNTER — Observation Stay
Admission: RE | Admit: 2017-01-16 | Discharge: 2017-01-17 | Disposition: A | Discharge status: Home / Self Care | Payer: Medicare Other | Source: Ambulatory Visit | Attending: Orthopedic Surgery | Admitting: Orthopedic Surgery

## 2017-01-16 ENCOUNTER — Encounter: Payer: Self-pay | Admitting: *Deleted

## 2017-01-16 DIAGNOSIS — Z7982 Long term (current) use of aspirin: Secondary | ICD-10-CM | POA: Insufficient documentation

## 2017-01-16 DIAGNOSIS — F17210 Nicotine dependence, cigarettes, uncomplicated: Secondary | ICD-10-CM | POA: Insufficient documentation

## 2017-01-16 DIAGNOSIS — Z9889 Other specified postprocedural states: Secondary | ICD-10-CM

## 2017-01-16 DIAGNOSIS — F419 Anxiety disorder, unspecified: Secondary | ICD-10-CM | POA: Insufficient documentation

## 2017-01-16 DIAGNOSIS — M75121 Complete rotator cuff tear or rupture of right shoulder, not specified as traumatic: Secondary | ICD-10-CM | POA: Diagnosis not present

## 2017-01-16 DIAGNOSIS — Z79899 Other long term (current) drug therapy: Secondary | ICD-10-CM | POA: Diagnosis not present

## 2017-01-16 DIAGNOSIS — I10 Essential (primary) hypertension: Secondary | ICD-10-CM | POA: Insufficient documentation

## 2017-01-16 DIAGNOSIS — Z5333 Arthroscopic surgical procedure converted to open procedure: Secondary | ICD-10-CM | POA: Diagnosis not present

## 2017-01-16 DIAGNOSIS — M7541 Impingement syndrome of right shoulder: Secondary | ICD-10-CM | POA: Diagnosis not present

## 2017-01-16 DIAGNOSIS — M66821 Spontaneous rupture of other tendons, right upper arm: Secondary | ICD-10-CM | POA: Diagnosis not present

## 2017-01-16 DIAGNOSIS — G8929 Other chronic pain: Secondary | ICD-10-CM | POA: Diagnosis not present

## 2017-01-16 DIAGNOSIS — S43431A Superior glenoid labrum lesion of right shoulder, initial encounter: Secondary | ICD-10-CM | POA: Diagnosis not present

## 2017-01-16 DIAGNOSIS — M65811 Other synovitis and tenosynovitis, right shoulder: Secondary | ICD-10-CM | POA: Diagnosis not present

## 2017-01-16 DIAGNOSIS — Z885 Allergy status to narcotic agent status: Secondary | ICD-10-CM | POA: Diagnosis not present

## 2017-01-16 DIAGNOSIS — M19011 Primary osteoarthritis, right shoulder: Secondary | ICD-10-CM | POA: Diagnosis not present

## 2017-01-16 DIAGNOSIS — M66811 Spontaneous rupture of other tendons, right shoulder: Secondary | ICD-10-CM | POA: Diagnosis not present

## 2017-01-16 DIAGNOSIS — B1920 Unspecified viral hepatitis C without hepatic coma: Secondary | ICD-10-CM | POA: Insufficient documentation

## 2017-01-16 DIAGNOSIS — R7303 Prediabetes: Secondary | ICD-10-CM | POA: Diagnosis not present

## 2017-01-16 DIAGNOSIS — Z7902 Long term (current) use of antithrombotics/antiplatelets: Secondary | ICD-10-CM | POA: Diagnosis not present

## 2017-01-16 DIAGNOSIS — I739 Peripheral vascular disease, unspecified: Secondary | ICD-10-CM | POA: Diagnosis not present

## 2017-01-16 DIAGNOSIS — M7551 Bursitis of right shoulder: Secondary | ICD-10-CM | POA: Insufficient documentation

## 2017-01-16 DIAGNOSIS — G8918 Other acute postprocedural pain: Secondary | ICD-10-CM | POA: Diagnosis not present

## 2017-01-16 DIAGNOSIS — F410 Panic disorder [episodic paroxysmal anxiety] without agoraphobia: Secondary | ICD-10-CM | POA: Diagnosis not present

## 2017-01-16 DIAGNOSIS — F329 Major depressive disorder, single episode, unspecified: Secondary | ICD-10-CM | POA: Insufficient documentation

## 2017-01-16 DIAGNOSIS — M25511 Pain in right shoulder: Secondary | ICD-10-CM | POA: Diagnosis not present

## 2017-01-16 HISTORY — PX: SHOULDER ARTHROSCOPY WITH OPEN ROTATOR CUFF REPAIR: SHX6092

## 2017-01-16 LAB — GLUCOSE, CAPILLARY
Glucose-Capillary: 104 mg/dL — ABNORMAL HIGH (ref 65–99)
Glucose-Capillary: 136 mg/dL — ABNORMAL HIGH (ref 65–99)

## 2017-01-16 LAB — URINE DRUG SCREEN, QUALITATIVE (ARMC ONLY)
Amphetamines, Ur Screen: NOT DETECTED
Barbiturates, Ur Screen: NOT DETECTED
Benzodiazepine, Ur Scrn: POSITIVE — AB
Cannabinoid 50 Ng, Ur ~~LOC~~: NOT DETECTED
Cocaine Metabolite,Ur ~~LOC~~: NOT DETECTED
MDMA (Ecstasy)Ur Screen: NOT DETECTED
Methadone Scn, Ur: POSITIVE — AB
Opiate, Ur Screen: NOT DETECTED
Phencyclidine (PCP) Ur S: NOT DETECTED
Tricyclic, Ur Screen: NOT DETECTED

## 2017-01-16 SURGERY — ARTHROSCOPY, SHOULDER WITH REPAIR, ROTATOR CUFF, OPEN
Anesthesia: General | Site: Shoulder | Laterality: Right | Wound class: Clean

## 2017-01-16 MED ORDER — PROPOFOL 10 MG/ML IV BOLUS
INTRAVENOUS | Status: AC
  Filled 2017-01-16: qty 20

## 2017-01-16 MED ORDER — SENNA 8.6 MG PO TABS
1.0000 | ORAL_TABLET | Freq: Two times a day (BID) | ORAL | Status: DC
  Administered 2017-01-16 – 2017-01-17 (×2): 8.6 mg via ORAL
  Filled 2017-01-16 (×2): qty 1

## 2017-01-16 MED ORDER — CLONAZEPAM 0.5 MG PO TABS
2.0000 mg | ORAL_TABLET | Freq: Three times a day (TID) | ORAL | Status: DC | PRN
  Administered 2017-01-17: 2 mg via ORAL
  Filled 2017-01-16: qty 4

## 2017-01-16 MED ORDER — BISACODYL 10 MG RE SUPP: 10.0000 mg | Freq: Every day | RECTAL | Status: DC | PRN

## 2017-01-16 MED ORDER — LIDOCAINE HCL (PF) 1 % IJ SOLN
INTRAMUSCULAR | Status: AC
  Filled 2017-01-16: qty 5

## 2017-01-16 MED ORDER — PHENOL 1.4 % MT LIQD
1.0000 | OROMUCOSAL | Status: DC | PRN
  Filled 2017-01-16: qty 177

## 2017-01-16 MED ORDER — CHLORHEXIDINE GLUCONATE CLOTH 2 % EX PADS: 6.0000 | MEDICATED_PAD | Freq: Once | CUTANEOUS | Status: DC

## 2017-01-16 MED ORDER — GLYCOPYRROLATE 0.2 MG/ML IJ SOLN
INTRAMUSCULAR | Status: AC
  Filled 2017-01-16: qty 1

## 2017-01-16 MED ORDER — TRAZODONE HCL 100 MG PO TABS
100.0000 mg | ORAL_TABLET | Freq: Every day | ORAL | Status: DC
  Administered 2017-01-16: 100 mg via ORAL
  Filled 2017-01-16: qty 1

## 2017-01-16 MED ORDER — ALUM & MAG HYDROXIDE-SIMETH 200-200-20 MG/5ML PO SUSP: 30.0000 mL | ORAL | Status: DC | PRN

## 2017-01-16 MED ORDER — ROCURONIUM BROMIDE 100 MG/10ML IV SOLN
INTRAVENOUS | Status: DC | PRN
  Administered 2017-01-16: 20 mg via INTRAVENOUS
  Administered 2017-01-16: 50 mg via INTRAVENOUS

## 2017-01-16 MED ORDER — MIDAZOLAM HCL 2 MG/2ML IJ SOLN
INTRAMUSCULAR | Status: AC
  Administered 2017-01-16: 0.5 mg via INTRAVENOUS
  Filled 2017-01-16: qty 2

## 2017-01-16 MED ORDER — ROPIVACAINE HCL 5 MG/ML IJ SOLN
INTRAMUSCULAR | Status: AC
  Filled 2017-01-16: qty 30

## 2017-01-16 MED ORDER — LIDOCAINE HCL (CARDIAC) 20 MG/ML IV SOLN
INTRAVENOUS | Status: DC | PRN
  Administered 2017-01-16: 60 mg via INTRAVENOUS

## 2017-01-16 MED ORDER — ROCURONIUM BROMIDE 50 MG/5ML IV SOLN
INTRAVENOUS | Status: AC
  Filled 2017-01-16: qty 1

## 2017-01-16 MED ORDER — OXYCODONE HCL 5 MG PO TABS: 5.0000 mg | ORAL_TABLET | Freq: Once | ORAL | Status: DC | PRN

## 2017-01-16 MED ORDER — NEOMYCIN-POLYMYXIN B GU 40-200000 IR SOLN
Status: AC
  Filled 2017-01-16: qty 2

## 2017-01-16 MED ORDER — PROMETHAZINE HCL 25 MG/ML IJ SOLN: 6.2500 mg | INTRAMUSCULAR | Status: DC | PRN

## 2017-01-16 MED ORDER — CEFAZOLIN SODIUM-DEXTROSE 1-4 GM/50ML-% IV SOLN
1.0000 g | Freq: Four times a day (QID) | INTRAVENOUS | Status: AC
  Administered 2017-01-16 – 2017-01-17 (×3): 1 g via INTRAVENOUS
  Filled 2017-01-16 (×3): qty 50

## 2017-01-16 MED ORDER — HYDROMORPHONE HCL 1 MG/ML IJ SOLN
1.0000 mg | INTRAMUSCULAR | Status: DC | PRN
  Administered 2017-01-16 – 2017-01-17 (×3): 1 mg via INTRAVENOUS
  Filled 2017-01-16 (×3): qty 1

## 2017-01-16 MED ORDER — FENTANYL CITRATE (PF) 100 MCG/2ML IJ SOLN
INTRAMUSCULAR | Status: DC | PRN
  Administered 2017-01-16 (×5): 50 ug via INTRAVENOUS

## 2017-01-16 MED ORDER — OXYCODONE HCL 5 MG PO TABS
10.0000 mg | ORAL_TABLET | ORAL | Status: DC | PRN
  Administered 2017-01-16 – 2017-01-17 (×5): 10 mg via ORAL
  Filled 2017-01-16 (×5): qty 2

## 2017-01-16 MED ORDER — CEFAZOLIN SODIUM-DEXTROSE 2-4 GM/100ML-% IV SOLN
INTRAVENOUS | Status: AC
  Filled 2017-01-16: qty 100

## 2017-01-16 MED ORDER — ESCITALOPRAM OXALATE 10 MG PO TABS
20.0000 mg | ORAL_TABLET | Freq: Every day | ORAL | Status: DC
Start: 2017-01-16 — End: 2017-01-17
  Administered 2017-01-17: 20 mg via ORAL
  Filled 2017-01-16: qty 2
  Filled 2017-01-16: qty 1

## 2017-01-16 MED ORDER — VITAMIN D (ERGOCALCIFEROL) 1.25 MG (50000 UNIT) PO CAPS
50000.0000 [IU] | ORAL_CAPSULE | ORAL | Status: DC
  Administered 2017-01-17: 50000 [IU] via ORAL
  Filled 2017-01-16: qty 1

## 2017-01-16 MED ORDER — FOLIC ACID 1 MG PO TABS
2.0000 mg | ORAL_TABLET | Freq: Two times a day (BID) | ORAL | Status: DC
  Administered 2017-01-16 – 2017-01-17 (×2): 2 mg via ORAL
  Filled 2017-01-16 (×2): qty 2

## 2017-01-16 MED ORDER — POLYETHYLENE GLYCOL 3350 17 G PO PACK: 17.0000 g | PACK | Freq: Every day | ORAL | Status: DC | PRN

## 2017-01-16 MED ORDER — FENTANYL CITRATE (PF) 100 MCG/2ML IJ SOLN
INTRAMUSCULAR | Status: AC
  Filled 2017-01-16: qty 2

## 2017-01-16 MED ORDER — SODIUM CHLORIDE 0.9 % IV SOLN
INTRAVENOUS | Status: DC
  Administered 2017-01-16 (×2): via INTRAVENOUS

## 2017-01-16 MED ORDER — MIDAZOLAM HCL 2 MG/2ML IJ SOLN
INTRAMUSCULAR | Status: DC | PRN
  Administered 2017-01-16: 2 mg via INTRAVENOUS

## 2017-01-16 MED ORDER — BUPIVACAINE HCL (PF) 0.25 % IJ SOLN
INTRAMUSCULAR | Status: AC
  Filled 2017-01-16: qty 30

## 2017-01-16 MED ORDER — ONDANSETRON HCL 4 MG PO TABS: 4.0000 mg | ORAL_TABLET | Freq: Four times a day (QID) | ORAL | Status: DC | PRN

## 2017-01-16 MED ORDER — MIDAZOLAM HCL 2 MG/2ML IJ SOLN
0.5000 mg | Freq: Once | INTRAMUSCULAR | Status: AC
  Administered 2017-01-16: 0.5 mg via INTRAVENOUS

## 2017-01-16 MED ORDER — DEXAMETHASONE SODIUM PHOSPHATE 4 MG/ML IJ SOLN
INTRAMUSCULAR | Status: DC | PRN
  Administered 2017-01-16: 5 mg via INTRAVENOUS

## 2017-01-16 MED ORDER — DOCUSATE SODIUM 100 MG PO CAPS
300.0000 mg | ORAL_CAPSULE | Freq: Every day | ORAL | Status: DC
  Administered 2017-01-17: 300 mg via ORAL
  Filled 2017-01-16: qty 3

## 2017-01-16 MED ORDER — METHOCARBAMOL 1000 MG/10ML IJ SOLN
500.0000 mg | Freq: Four times a day (QID) | INTRAVENOUS | Status: DC | PRN
  Filled 2017-01-16: qty 5

## 2017-01-16 MED ORDER — FAMOTIDINE 20 MG PO TABS
20.0000 mg | ORAL_TABLET | Freq: Once | ORAL | Status: AC
  Administered 2017-01-16: 20 mg via ORAL

## 2017-01-16 MED ORDER — ATORVASTATIN CALCIUM 20 MG PO TABS
40.0000 mg | ORAL_TABLET | Freq: Every day | ORAL | Status: DC
  Administered 2017-01-17: 40 mg via ORAL
  Filled 2017-01-16: qty 2

## 2017-01-16 MED ORDER — LISINOPRIL 10 MG PO TABS
10.0000 mg | ORAL_TABLET | Freq: Every day | ORAL | Status: DC
  Administered 2017-01-17: 10 mg via ORAL
  Filled 2017-01-16: qty 1

## 2017-01-16 MED ORDER — FENTANYL CITRATE (PF) 100 MCG/2ML IJ SOLN: 25.0000 ug | INTRAMUSCULAR | Status: DC | PRN

## 2017-01-16 MED ORDER — LIDOCAINE HCL (PF) 1 % IJ SOLN
INTRAMUSCULAR | Status: AC
  Filled 2017-01-16: qty 30

## 2017-01-16 MED ORDER — CLOPIDOGREL BISULFATE 75 MG PO TABS: 75.0000 mg | ORAL_TABLET | Freq: Every day | ORAL | Status: DC

## 2017-01-16 MED ORDER — ACETAMINOPHEN 325 MG PO TABS: 650.0000 mg | ORAL_TABLET | Freq: Four times a day (QID) | ORAL | Status: DC | PRN

## 2017-01-16 MED ORDER — MENTHOL 3 MG MT LOZG
1.0000 | LOZENGE | OROMUCOSAL | Status: DC | PRN
  Filled 2017-01-16: qty 9

## 2017-01-16 MED ORDER — MIDAZOLAM HCL 2 MG/2ML IJ SOLN
INTRAMUSCULAR | Status: AC
  Filled 2017-01-16: qty 2

## 2017-01-16 MED ORDER — ONDANSETRON HCL 4 MG/2ML IJ SOLN
INTRAMUSCULAR | Status: AC
  Filled 2017-01-16: qty 2

## 2017-01-16 MED ORDER — ACETAMINOPHEN 650 MG RE SUPP
650.0000 mg | Freq: Four times a day (QID) | RECTAL | Status: DC | PRN
Start: 2017-01-16 — End: 2017-01-17

## 2017-01-16 MED ORDER — PROPOFOL 10 MG/ML IV BOLUS
INTRAVENOUS | Status: DC | PRN
  Administered 2017-01-16: 120 mg via INTRAVENOUS

## 2017-01-16 MED ORDER — ASPIRIN EC 81 MG PO TBEC
81.0000 mg | DELAYED_RELEASE_TABLET | Freq: Every day | ORAL | Status: DC
  Administered 2017-01-17: 81 mg via ORAL
  Filled 2017-01-16: qty 1

## 2017-01-16 MED ORDER — EPINEPHRINE 30 MG/30ML IJ SOLN
INTRAMUSCULAR | Status: AC
Start: 2017-01-16 — End: ?
  Filled 2017-01-16: qty 1

## 2017-01-16 MED ORDER — ONDANSETRON HCL 4 MG/2ML IJ SOLN
4.0000 mg | Freq: Four times a day (QID) | INTRAMUSCULAR | Status: DC | PRN
Start: 2017-01-16 — End: 2017-01-17

## 2017-01-16 MED ORDER — IBUPROFEN 400 MG PO TABS: 800.0000 mg | ORAL_TABLET | Freq: Every day | ORAL | Status: DC | PRN

## 2017-01-16 MED ORDER — LABETALOL HCL 5 MG/ML IV SOLN
INTRAVENOUS | Status: AC
  Filled 2017-01-16: qty 4

## 2017-01-16 MED ORDER — ONDANSETRON HCL 4 MG/2ML IJ SOLN
INTRAMUSCULAR | Status: DC | PRN
  Administered 2017-01-16: 4 mg via INTRAVENOUS

## 2017-01-16 MED ORDER — DIPHENHYDRAMINE HCL 12.5 MG/5ML PO ELIX: 12.5000 mg | ORAL_SOLUTION | ORAL | Status: DC | PRN

## 2017-01-16 MED ORDER — BUPIVACAINE HCL 0.25 % IJ SOLN
INTRAMUSCULAR | Status: DC | PRN
  Administered 2017-01-16: 30 mL

## 2017-01-16 MED ORDER — METHADONE HCL 10 MG/ML PO CONC
120.0000 mg | Freq: Every day | ORAL | Status: DC
  Administered 2017-01-17: 120 mg via ORAL
  Filled 2017-01-16: qty 12

## 2017-01-16 MED ORDER — DEXAMETHASONE SODIUM PHOSPHATE 10 MG/ML IJ SOLN
INTRAMUSCULAR | Status: AC
  Filled 2017-01-16: qty 1

## 2017-01-16 MED ORDER — LABETALOL HCL 5 MG/ML IV SOLN
INTRAVENOUS | Status: DC | PRN
Start: 2017-01-16 — End: 2017-01-16
  Administered 2017-01-16: 10 mg via INTRAVENOUS

## 2017-01-16 MED ORDER — LIDOCAINE HCL (PF) 4 % IJ SOLN
INTRAMUSCULAR | Status: DC | PRN
Start: 2017-01-16 — End: 2017-01-16
  Administered 2017-01-16: 4 mL via RESPIRATORY_TRACT

## 2017-01-16 MED ORDER — METHOCARBAMOL 500 MG PO TABS
500.0000 mg | ORAL_TABLET | Freq: Four times a day (QID) | ORAL | Status: DC | PRN
  Administered 2017-01-17: 500 mg via ORAL
  Filled 2017-01-16 (×2): qty 1

## 2017-01-16 MED ORDER — NEOMYCIN-POLYMYXIN B GU 40-200000 IR SOLN
Status: DC | PRN
  Administered 2017-01-16: 2 mL

## 2017-01-16 MED ORDER — MAGNESIUM CITRATE PO SOLN
1.0000 | Freq: Once | ORAL | Status: AC | PRN
  Administered 2017-01-17: 1 via ORAL
  Filled 2017-01-16 (×4): qty 296

## 2017-01-16 MED ORDER — MEPERIDINE HCL 50 MG/ML IJ SOLN
6.2500 mg | INTRAMUSCULAR | Status: DC | PRN
Start: 2017-01-16 — End: 2017-01-16

## 2017-01-16 MED ORDER — ACETAMINOPHEN 10 MG/ML IV SOLN
INTRAVENOUS | Status: AC
  Filled 2017-01-16: qty 100

## 2017-01-16 MED ORDER — FAMOTIDINE 20 MG PO TABS
ORAL_TABLET | ORAL | Status: AC
  Filled 2017-01-16: qty 1

## 2017-01-16 MED ORDER — LIDOCAINE HCL (PF) 2 % IJ SOLN
INTRAMUSCULAR | Status: AC
  Filled 2017-01-16: qty 2

## 2017-01-16 MED ORDER — SUGAMMADEX SODIUM 200 MG/2ML IV SOLN
INTRAVENOUS | Status: AC
  Filled 2017-01-16: qty 2

## 2017-01-16 MED ORDER — SUGAMMADEX SODIUM 200 MG/2ML IV SOLN
INTRAVENOUS | Status: DC | PRN
  Administered 2017-01-16: 130 mg via INTRAVENOUS

## 2017-01-16 MED ORDER — LIDOCAINE HCL 1 % IJ SOLN
INTRAMUSCULAR | Status: DC | PRN
  Administered 2017-01-16: 10 mL

## 2017-01-16 MED ORDER — OXYCODONE HCL 5 MG/5ML PO SOLN: 5.0000 mg | Freq: Once | ORAL | Status: DC | PRN

## 2017-01-16 MED ORDER — SODIUM CHLORIDE 0.9 % IV SOLN: INTRAVENOUS | Status: DC

## 2017-01-16 MED ORDER — FENTANYL CITRATE (PF) 250 MCG/5ML IJ SOLN
INTRAMUSCULAR | Status: AC
  Filled 2017-01-16: qty 5

## 2017-01-16 MED ORDER — GLYCOPYRROLATE 0.2 MG/ML IJ SOLN
INTRAMUSCULAR | Status: DC | PRN
  Administered 2017-01-16: 0.2 mg via INTRAVENOUS

## 2017-01-16 SURGICAL SUPPLY — 76 items
ADAPTER IRRIG TUBE 2 SPIKE SOL (ADAPTER) ×4 IMPLANT
ADPR TBG 2 SPK PMP STRL ASCP (ADAPTER) ×2
ANCH SUT Q-FX 2.8 (Anchor) ×2 IMPLANT
ANCHOR ALL-SUT Q-FIX 2.8 (Anchor) ×2 IMPLANT
BIT DRILL SPADE OPUS 3.2 (MISCELLANEOUS) ×1 IMPLANT
BUR RADIUS 4.0X18.5 (BURR) ×2 IMPLANT
BUR RADIUS 5.5 (BURR) ×2 IMPLANT
CANISTER SUCT LVC 12 LTR MEDI- (MISCELLANEOUS) IMPLANT
CANNULA 5.75X7 CRYSTAL CLEAR (CANNULA) ×4 IMPLANT
CANNULA PARTIAL THREAD 2X7 (CANNULA) ×2 IMPLANT
CANNULA TWIST IN 8.25X9CM (CANNULA) IMPLANT
CONNECTOR PERFECT PASSER (CONNECTOR) ×1 IMPLANT
COOLER POLAR GLACIER W/PUMP (MISCELLANEOUS) ×2 IMPLANT
CRADLE LAMINECT ARM (MISCELLANEOUS) ×2 IMPLANT
DEVICE SUCT BLK HOLE OR FLOOR (MISCELLANEOUS) IMPLANT
DRAPE IMP U-DRAPE 54X76 (DRAPES) ×4 IMPLANT
DRAPE INCISE IOBAN 66X45 STRL (DRAPES) ×2 IMPLANT
DRAPE SHEET LG 3/4 BI-LAMINATE (DRAPES) ×2 IMPLANT
DRAPE U-SHAPE 47X51 STRL (DRAPES) IMPLANT
DRILL SPADE OPUS 3.2 (MISCELLANEOUS) ×2
DURAPREP 26ML APPLICATOR (WOUND CARE) ×6 IMPLANT
ELECT REM PT RETURN 9FT ADLT (ELECTROSURGICAL) ×2
ELECTRODE REM PT RTRN 9FT ADLT (ELECTROSURGICAL) ×1 IMPLANT
GAUZE PETRO XEROFOAM 1X8 (MISCELLANEOUS) ×2 IMPLANT
GAUZE SPONGE 4X4 12PLY STRL (GAUZE/BANDAGES/DRESSINGS) ×4 IMPLANT
GLOVE BIOGEL PI IND STRL 7.0 (GLOVE) IMPLANT
GLOVE BIOGEL PI IND STRL 9 (GLOVE) ×1 IMPLANT
GLOVE BIOGEL PI INDICATOR 7.0 (GLOVE) ×2
GLOVE BIOGEL PI INDICATOR 9 (GLOVE) ×1
GLOVE SURG 9.0 ORTHO LTXF (GLOVE) ×5 IMPLANT
GOWN STRL REUS TWL 2XL XL LVL4 (GOWN DISPOSABLE) ×2 IMPLANT
GOWN STRL REUS W/ TWL LRG LVL3 (GOWN DISPOSABLE) ×1 IMPLANT
GOWN STRL REUS W/ TWL LRG LVL4 (GOWN DISPOSABLE) ×1 IMPLANT
GOWN STRL REUS W/TWL LRG LVL3 (GOWN DISPOSABLE) ×2
GOWN STRL REUS W/TWL LRG LVL4 (GOWN DISPOSABLE) ×2
IV LACTATED RINGER IRRG 3000ML (IV SOLUTION) ×28
IV LR IRRIG 3000ML ARTHROMATIC (IV SOLUTION) ×6 IMPLANT
KIT RM TURNOVER STRD PROC AR (KITS) ×2 IMPLANT
KIT STABILIZATION SHOULDER (MISCELLANEOUS) ×2 IMPLANT
KIT SUTURE 2.8 Q-FIX DISP (MISCELLANEOUS) ×1 IMPLANT
KIT SUTURETAK 3.0 INSERT PERC (KITS) IMPLANT
MANIFOLD NEPTUNE II (INSTRUMENTS) ×2 IMPLANT
MASK FACE SPIDER DISP (MASK) ×2 IMPLANT
MAT BLUE FLOOR 46X72 FLO (MISCELLANEOUS) ×4 IMPLANT
NDL SAFETY 18GX1.5 (NEEDLE) ×2 IMPLANT
NDL SAFETY 22GX1.5 (NEEDLE) ×2 IMPLANT
NDL SAFETY ECLIPSE 18X1.5 (NEEDLE) IMPLANT
NEEDLE HYPO 18GX1.5 SHARP (NEEDLE) ×2
NS IRRIG 500ML POUR BTL (IV SOLUTION) ×2 IMPLANT
PACK ARTHROSCOPY SHOULDER (MISCELLANEOUS) ×2 IMPLANT
PAD WRAPON POLAR SHDR XLG (MISCELLANEOUS) ×1 IMPLANT
PASSER SUT CAPTURE FIRST (SUTURE) ×1 IMPLANT
SET TUBE SUCT SHAVER OUTFL 24K (TUBING) ×2 IMPLANT
SET TUBE TIP INTRA-ARTICULAR (MISCELLANEOUS) ×2 IMPLANT
STRAP SAFETY BODY (MISCELLANEOUS) ×2 IMPLANT
STRIP CLOSURE SKIN 1/2X4 (GAUZE/BANDAGES/DRESSINGS) ×4 IMPLANT
SUT ETHILON 4-0 (SUTURE) ×2
SUT ETHILON 4-0 FS2 18XMFL BLK (SUTURE) ×1
SUT KNTLS 2.8 MAGNUM (Anchor) ×2 IMPLANT
SUT LASSO 90 DEG SD STR (SUTURE) IMPLANT
SUT MNCRL 4-0 (SUTURE) ×2
SUT MNCRL 4-0 27XMFL (SUTURE) ×1
SUT PDS AB 0 CT1 27 (SUTURE) ×2 IMPLANT
SUT PERFECTPASSER WHITE CART (SUTURE) ×3 IMPLANT
SUT SMART STITCH CARTRIDGE (SUTURE) ×2 IMPLANT
SUT VIC AB 0 CT1 36 (SUTURE) ×2 IMPLANT
SUT VIC AB 2-0 CT2 27 (SUTURE) ×2 IMPLANT
SUTURE ETHLN 4-0 FS2 18XMF BLK (SUTURE) ×1 IMPLANT
SUTURE MAGNUM WIRE 2X48 BLK (SUTURE) IMPLANT
SUTURE MNCRL 4-0 27XMF (SUTURE) ×1 IMPLANT
SYRINGE 10CC LL (SYRINGE) ×2 IMPLANT
TAPE MICROFOAM 4IN (TAPE) ×2 IMPLANT
TUBING ARTHRO INFLOW-ONLY STRL (TUBING) ×2 IMPLANT
TUBING CONNECTING 10 (TUBING) ×2 IMPLANT
WAND HAND CNTRL MULTIVAC 90 (MISCELLANEOUS) ×2 IMPLANT
WRAPON POLAR PAD SHDR XLG (MISCELLANEOUS) ×4

## 2017-01-16 NOTE — Progress Notes (Signed)
Patient was admitted to room 153 from OR via room bed. A&O x4. Immobilizer, cryo cuff, and foot pumps in place. Oriented to room, call light, TV and bed controls. Bed alarm on for safety.

## 2017-01-16 NOTE — Op Note (Addendum)
01/16/2017  5:51 PM  PATIENT:  Yesenia Shepard  66 y.o. female  PRE-OPERATIVE DIAGNOSIS:  Full thickness tear of the rotator cuff right shoulder, subacromial impingement, acromioclavicular joint arthrosis  POST-OPERATIVE DIAGNOSIS:Full thickness tear of the rotator cuff right shoulder, subacromial impingement, acromioclavicular joint arthrosis,  spontaneous rupture of biceps tendon, superior labral tear and chondral lesion of the humeral head  PROCEDURE:  Procedure(s): 1.  RIGHT SHOULDER ARTHROSCOPY WITH MINI-OPEN ROTATOR CUFF REPAIR 2.  ARTHROSCOPIC SUBACROMIAL DECOMPRESSION 3.  ARTHROSCOPIC DISTAL CLAVICLE EXCISION 4.  ARTHROSCOPIC DEBRIDEMENT OF BICEPS, LABRUM AND SYNOVIUM 5.  ARTHROSCOPIC CHONDROPLASTY OF THE HUMERAL HEAD  SURGEON:  Surgeon(s) and Role:    Thornton Park, MD - Primary  ANESTHESIA:   local, general and paracervical block   PREOPERATIVE INDICATIONS:  Yesenia Shepard is a  66 y.o. female with a diagnosis of a full-thickness rotator cuff tear of the right shoulder who failed conservative treatment and elected for surgical management.    The risks benefits and alternatives were discussed with the patient preoperatively including but not limited to the risks of infection, bleeding, nerve injury, persistent pain or weakness, failure of the hardware, re-tear of the rotator cuff and the need for further surgery. Medical risks include DVT and pulmonary embolism, myocardial infarction, stroke, pneumonia, respiratory failure and death. Patient understood these risks and wished to proceed.  OPERATIVE IMPLANTS: ArthroCare Magnum 2 anchors x 2 & Smith and Nephew Q Fix anchors x 2  OPERATIVE FINDINGS: Full-thickness V-shaped tear of the rotator cuff, spontaneous rupture of the biceps tendon with residual stump of the superior labrum, acromioclavicular joint arthrosis, extensive synovitis of the glenohumeral joint, extensive subacromial bursitis  OPERATIVE PROCEDURE: The patient was met  in the preoperative area. The right shoulder was signed with the word yes and my initials according the hospital's correct site of surgery protocol.   History and physical was updated.  Patient underwent an interscalene block by the anesthesia service.  Patient was brought to the operating room where she underwent general endotracheal intubation.  The patient was placed in a beachchair position.  A spider arm positioner was used for this case. Examination under anesthesia revealed no limitation of motion or instability with load shift testing. The patient had a negative sulcus sign.  Patient was prepped and draped in a sterile fashion. A timeout was performed to verify the patient's name, date of birth, medical record number, correct site of surgery and correct procedure to be performed there was also used to verify the patient received antibiotics that all appropriate instruments, implants and radiographs studies were available in the room. Once all in attendance were in agreement case began.  Bony landmarks were drawn out with a surgical marker along with proposed arthroscopy incisions. These were pre-injected with 1% lidocaine plain. An 11 blade was used to establish a posterior portal through which the arthroscope was placed in the glenohumeral joint. A full diagnostic examination of the shoulder was performed.  Patient was found to have a full-thickness rotator cuff tear, with extensive synovitis of the glenohumeral joint.  Patient had a superior labral tear the residual stump spontaneously torn long head of the biceps tendon.  Extensive debridement of the synovitis, superior labrum and biceps tendon stump was performed using both a 90 ArthroCare wand and 4.0 resector shaver blade.  A chondroplasty of the humeral head was performed using a 4.0 resector shaver blade. The iarticular cartilage lesion measured approximately 10 x 10 mm. A lateral portal was established using  an 18-gauge spinal needle for  localization. The 4.0 resector shaver blade was placed through the lateral portal. It was used to debride the frayed edges of the rotator cuff. It was then used on bur mode to debride the greater tuberosity of rotator cuff fibers until punctate bleeding was seen.  The arthroscope was then placed in the subacromial space. Extensive bursitis was encountered and debrided using a 4-0 resector shaver blade and a 90 ArthroCare wand from a lateral portal . A subacromial decompression was also performed using a 5.5 resector shaver blade from the lateral portal.  The 5.5 resector shaver blade was then placed through the anterior portal and distal clavicle excision was performed.  2 Perfect Pass sutures were placed in the lateral border of the rotator cuff tear. All arthroscopic instruments were then removed and the mini-open portion of the procedure began.  A saber-type incision was made along the lateral border of the acromion. The deltoid muscle was identified and split in line with its fibers which allowed visualization of the rotator cuff. The Perfect Pass sutures previously placed in the lateral border of the rotator cuff were brought out through the deltoid split.   The V-shaped rotator cuff tear was then approximated with marginal convergence using perfect Pass sutures 3.  Two Smith and [employer] Fix anchors were placed at the articular margin of the humeral head with the greater tuberosity. The suture limbs of the Q Fix anchor were passed medially through the rotator cuff using a First Pass suture passer.   The original two Perfect Pass sutures were then anchored to the greater tuberosity footprint using two Arthrocare Magnum 2 anchors. These anchors were tensioned to allow for anatomic reduction of the rotator cuff to the greater tuberosity. The medial row sutures from the Q fix anchors were then tied down using an arthroscopic knot tying technique.  Arthroscopic images of the repair were taken with the  arthroscope both externally and from inside the glenohumeral joint.  All incisions were copiously irrigated. The deltoid fascia was repaired using a 0 Vicryl suture.  The subcutaneous tissue of all incisions were closed with a 2-0 Vicryl. Skin closure for the arthroscopic incisions was performed with 4-0 nylon. The skin edges of the saber incision was approximated with a running 4-0 undyed Monocryl.  0.25% Marcaine plain was then injected into the subacromial space for postoperative pain control. A dry sterile dressing was applied.  The patient was placed in an abduction sling and a Polar Care was applied to the shoulder.  All sharp and it instrument counts were correct at the conclusion of the case. I was scrubbed and present for the entire case. I spoke with the patient's friend postoperatively by phone to let him know the case was performed without complication and the patient was stable in recovery room.

## 2017-01-16 NOTE — OR Nursing (Signed)
Patient extremely anxious. Chaplain called in to speak briefly with patient. Somewhat calmer since he spoke to her.

## 2017-01-16 NOTE — Anesthesia Postprocedure Evaluation (Signed)
Anesthesia Post Note  Patient: Yesenia Shepard  Procedure(s) Performed: Procedure(s) (LRB): SHOULDER ARTHROSCOPY WITH OPEN ROTATOR CUFF REPAIR (Right)  Patient location during evaluation: PACU Anesthesia Type: General Level of consciousness: awake and alert Pain management: pain level controlled Vital Signs Assessment: post-procedure vital signs reviewed and stable Respiratory status: spontaneous breathing and respiratory function stable Cardiovascular status: stable Anesthetic complications: no     Last Vitals:  Vitals:   01/16/17 1723 01/16/17 1737  BP: 135/68 132/64  Pulse: (!) 59 64  Resp: 14 19  Temp: 36.5 C     Last Pain:  Vitals:   01/16/17 1737  TempSrc:   PainSc: 0-No pain                 KEPHART,WILLIAM K

## 2017-01-16 NOTE — Anesthesia Procedure Notes (Signed)
Procedure Name: Intubation Date/Time: 01/16/2017 1:36 PM Performed by: Rosaria Ferries, Breland Trouten Pre-anesthesia Checklist: Patient identified, Emergency Drugs available, Suction available and Patient being monitored Patient Re-evaluated:Patient Re-evaluated prior to induction Oxygen Delivery Method: Circle system utilized Preoxygenation: Pre-oxygenation with 100% oxygen Induction Type: IV induction Laryngoscope Size: Mac and 3 Grade View: Grade I Tube type: Oral Tube size: 7.0 mm Number of attempts: 1 Airway Equipment and Method: LTA kit utilized Placement Confirmation: ETT inserted through vocal cords under direct vision,  positive ETCO2 and breath sounds checked- equal and bilateral Secured at: 21 cm Tube secured with: Tape Dental Injury: Teeth and Oropharynx as per pre-operative assessment  Comments: Particular care given to precarious lower teeth

## 2017-01-16 NOTE — OR Nursing (Signed)
Patient very wobbly upon arrival to [place].  Needed another person to assist her down the hall.  She states that her feet are numb all the time but vascular surgeon felt that the stent in her leg was doing well.  She is also very teary eyed as her daughter passed away less than 2 months ago.  States she has no other people who could help her out at home.  A person that she is the caregiver for will be her driver today but can not do more than that.  Will discuss with surgeon and anesthesia. Urine for UDS obtained.

## 2017-01-16 NOTE — Anesthesia Post-op Follow-up Note (Cosign Needed)
Anesthesia QCDR form completed.        

## 2017-01-16 NOTE — Anesthesia Preprocedure Evaluation (Signed)
Anesthesia Evaluation  Patient identified by MRN, date of birth, ID band Patient awake    Reviewed: Allergy & Precautions, NPO status , Patient's Chart, lab work & pertinent test results  History of Anesthesia Complications Negative for: history of anesthetic complications  Airway Mallampati: II  TM Distance: >3 FB Neck ROM: Full    Dental  (+) Upper Dentures, Missing, Loose, Poor Dentition   Pulmonary neg sleep apnea, neg COPD, Current Smoker,    breath sounds clear to auscultation- rhonchi (-) wheezing      Cardiovascular hypertension, Pt. on medications + Peripheral Vascular Disease  (-) CAD, (-) Past MI and (-) Cardiac Stents  Rhythm:Regular Rate:Normal - Systolic murmurs and - Diastolic murmurs    Neuro/Psych PSYCHIATRIC DISORDERS Anxiety Depression negative neurological ROS     GI/Hepatic negative GI ROS, (+) Hepatitis -, C  Endo/Other  diabetes (borderline)  Renal/GU negative Renal ROS     Musculoskeletal  (+) Arthritis ,   Abdominal (+) - obese,   Peds  Hematology negative hematology ROS (+)   Anesthesia Other Findings Past Medical History: No date: Anxiety No date: Arthritis No date: Chronic pain No date: Depression No date: Drug abuse     Comment: History of polysubstance abuse; currently on               methadone. No date: Hepatitis     Comment: History of Hep "C". treated and cured with               Harvoni No date: History of chicken pox No date: Hypertension No date: Panic attacks No date: Peripheral vascular disease ([place])     Comment: stent in place.  No date: Pre-diabetes No date: UTI (urinary tract infection)     Comment: History of   Reproductive/Obstetrics                             Anesthesia Physical Anesthesia Plan  ASA: III  Anesthesia Plan: General   Post-op Pain Management:  Regional for Post-op pain   Induction: Intravenous  PONV Risk  Score and Plan: 1 and Ondansetron, Dexamethasone and Propofol  Airway Management Planned: Oral ETT  Additional Equipment:   Intra-op Plan:   Post-operative Plan: Extubation in OR  Informed Consent: I have reviewed the patients History and Physical, chart, labs and discussed the procedure including the risks, benefits and alternatives for the proposed anesthesia with the patient or authorized representative who has indicated his/her understanding and acceptance.   Dental advisory given  Plan Discussed with: CRNA and Anesthesiologist  Anesthesia Plan Comments:         Anesthesia Quick Evaluation

## 2017-01-16 NOTE — Transfer of Care (Signed)
Immediate Anesthesia Transfer of Care Note  Patient: Yesenia Shepard  Procedure(s) Performed: Procedure(s): SHOULDER ARTHROSCOPY WITH OPEN ROTATOR CUFF REPAIR (Right)  Patient Location: PACU  Anesthesia Type:General  Level of Consciousness: sedated  Airway & Oxygen Therapy: Patient Spontanous Breathing and Patient connected to face mask oxygen  Post-op Assessment: Report given to RN and Post -op Vital signs reviewed and stable  Post vital signs: Reviewed and stable  Last Vitals:  Vitals:   01/16/17 1319 01/16/17 1723  BP: (!) 152/61 135/68  Pulse: (!) 51 (!) 59  Resp: 13 14  Temp:  36.5 C    Last Pain:  Vitals:   01/16/17 1319  TempSrc:   PainSc: 8       Patients Stated Pain Goal: 1 (55/83/16 7425)  Complications: No apparent anesthesia complications

## 2017-01-17 ENCOUNTER — Encounter: Payer: Self-pay | Admitting: Orthopedic Surgery

## 2017-01-17 DIAGNOSIS — M66811 Spontaneous rupture of other tendons, right shoulder: Secondary | ICD-10-CM | POA: Diagnosis not present

## 2017-01-17 DIAGNOSIS — M75121 Complete rotator cuff tear or rupture of right shoulder, not specified as traumatic: Secondary | ICD-10-CM | POA: Diagnosis not present

## 2017-01-17 DIAGNOSIS — M19011 Primary osteoarthritis, right shoulder: Secondary | ICD-10-CM | POA: Diagnosis not present

## 2017-01-17 DIAGNOSIS — M65811 Other synovitis and tenosynovitis, right shoulder: Secondary | ICD-10-CM | POA: Diagnosis not present

## 2017-01-17 DIAGNOSIS — M7541 Impingement syndrome of right shoulder: Secondary | ICD-10-CM | POA: Diagnosis not present

## 2017-01-17 DIAGNOSIS — M7551 Bursitis of right shoulder: Secondary | ICD-10-CM | POA: Diagnosis not present

## 2017-01-17 MED ORDER — ROPIVACAINE HCL 5 MG/ML IJ SOLN
INTRAMUSCULAR | Status: DC | PRN
  Administered 2017-01-16: 30 mL via PERINEURAL

## 2017-01-17 MED ORDER — LIDOCAINE HCL (PF) 1 % IJ SOLN
INTRAMUSCULAR | Status: DC | PRN
  Administered 2017-01-16: 3 mL

## 2017-01-17 MED ORDER — OXYCODONE HCL 5 MG PO TABS: 5.0000 mg | ORAL_TABLET | ORAL | 0 refills | Status: DC | PRN

## 2017-01-17 NOTE — Evaluation (Signed)
Physical Therapy Evaluation Patient Details Name: Yesenia Shepard MRN: [id number] DOB: Aug 30, 1950 Today's Date: 01/17/2017   History of Present Illness  Pt is a 66 yo F admitted to acute care on 01/16/17 for R rotator cuff repair. Pt currently post-op day 1. Prior to admission, pt independent with ADL's/IADL's, using no AD. PMH: anxiety, arthritis, depression, chronic pain, drug abuse, hepatitis C, HTN, panic attacks, PVD, pre-diabetes, and UTI.   Clinical Impression  This entire session was guided, instructed, and directly supervised by Greggory Stallion, DPT. Pt performs bed mobility with modI, tranfers with supervision, and ambulation with MinA with HHA due to impaired strength, balance, high anxiety, and high pain levels. Pt amb total of 300 ft with intermittent quad cane and SPC training, however, pt was unable to perform correct mechanics and safety with AD. Pt safe with HHA; required 1 rest break after ~150 ft; chair follow for safety as pt anxious about walking around nurses station and required consistent cues and motivation to continue task. Pt able to navigate 4 stairs with HHA and min guard due to impaired LE strength, balance, high anxiety, and R shoulder pain. Overall, pt responded well to today's treatment with no adverse affects, meeting all goals for d/c to home. Pt would benefit from skilled PT to address the previously mentioned impairments and promote return to PLOF. Currently recommending outpatient PT, pending d/c, given she has assist from partner in home for mobility and driving purposes.      Follow Up Recommendations Outpatient PT    Equipment Recommendations  Other (comment) (gait belt)    Recommendations for Other Services       Precautions / Restrictions Precautions Precautions: Shoulder Type of Shoulder Precautions: Excessive ROM in all directions Shoulder Interventions: Shoulder sling/immobilizer;Shoulder abduction pillow;At all times;Off for  dressing/bathing/exercises Precaution Booklet Issued: No Precaution Comments: elbow, wrist and hand ROM okay Restrictions Weight Bearing Restrictions: Yes RUE Weight Bearing: Non-Weight Bearing     Mobility  Bed Mobility Overal bed mobility: Modified Independent             General bed mobility comments: HOB elevated adn utilized bed rail to self-assist. min verbal cues required to achieve erect position in bed.   Transfers Overall transfer level: Needs assistance   Transfers: Sit to/from Stand Sit to Stand: Supervision         General transfer comment: Supervision with STS due to high pain levels and mild unsteadiness on ft with initial standing balance. Required no cues for correct mechancis and safety awareness. Utilized L bed/chair rail to self-assist.  Ambulation/Gait Ambulation/Gait assistance: Museum/gallery curator (Feet): 300 Feet Assistive device: Quad cane;Straight cane;1 person hand held assist Gait Pattern/deviations: Step-through pattern;Step-to pattern;Decreased step length - left Gait velocity: 1.6 ft/sec Gait velocity interpretation: <1.8 ft/sec, indicative of risk for recurrent falls General Gait Details: Pt amb total 300 ft. 150 ft with quad cane; 120 ft with SPC and 30 ft with HHA. Pt with step-to gait pattern, leading with L LE, and requiring max cues and was unable to consistenlty perform safe body mechanics and safety awareness with use of quad cane of SPC. However, with HHA pt demo reciprical gait pattern with good body mechanics/safety awareness and min verbal cues. Chair follow for safety as pt was highly anxious throughout session and required consistent cuing and motivation to continue treatment.  Stairs Stairs: Yes   Stair Management: No rails;Forwards Number of Stairs: 4 General stair comments: Pt ascend/desced total of 4 stairs with HHA and  minA at gait belt. Required min verbal cues for body mechanics, safety, and to continue  breathing (with tendency to hold breath)  Wheelchair Mobility    Modified Rankin (Stroke Patients Only)       Balance Overall balance assessment: Modified Independent Sitting-balance support: Bilateral upper extremity supported;Feet supported Sitting balance-Leahy Scale: Good Sitting balance - Comments: Pt ModI with sitting balance, requiring no cues for body mechanics/safety awareness.    Standing balance support: Single extremity supported Standing balance-Leahy Scale: Good Standing balance comment: Pt with good static standing balance, with HHA from SPT. noted increased postural sway with dynamic standing balance; able to self correct, using HHA.                              Pertinent Vitals/Pain Pain Assessment: 0-10 Pain Score: 8  Faces Pain Scale: Hurts even more Pain Location: R shoulder Pain Descriptors / Indicators: Constant;Discomfort;Grimacing;Operative site guarding;Pressure Pain Intervention(s): Repositioned;RN gave pain meds during session;Patient requesting pain meds-RN notified;Ice applied;Monitored during session;Limited activity within patient's tolerance;Utilized relaxation techniques    Home Living Family/patient expects to be discharged to:: Private residence Living Arrangements: Spouse/significant other (partner [name]) Available Help at Discharge: Family;Friend(s);Available PRN/intermittently;Available 24 hours/day [name] available 24/7 (limited physical assist), friend PRN ) Type of Home: House Home Access: Stairs to enter Entrance Stairs-Rails: None Entrance Stairs-Number of Steps: 2 Home Layout: One level Home Equipment: Shower seat;Adaptive equipment Additional Comments: Independent with no AD prior to [place].     Prior Function Level of Independence: Independent         Comments: indep with ADL/IADL including driving, enjoys taking her small dog on walks; no falls in past 12 months; partner is slightly limited (uses RW) and pt  does laundry, meals, and helps partner wash his back during bathing     Hand Dominance   Dominant Hand: Right    Extremity/Trunk Assessment   Upper Extremity Assessment Upper Extremity Assessment: Generalized weakness;RUE deficits/detail RUE Deficits / Details: RUE immobilized in sling with abduction pillow RUE: Unable to fully assess due to immobilization;Unable to fully assess due to pain    Lower Extremity Assessment Lower Extremity Assessment: Overall WFL for tasks assessed    Cervical / Trunk Assessment Cervical / Trunk Assessment: Normal  Communication   Communication: No difficulties  Cognition Arousal/Alertness: Awake/alert Behavior During Therapy: Anxious Overall Cognitive Status: Within Functional Limits for tasks assessed                                 General Comments: Highly anxious throughout session. Became teary once during session, reporting pain to R UE.       General Comments General comments (skin integrity, edema, etc.):     Exercises Other Exercises: Supine therex performed to B LE's with supervision x10 reps: SLR and hip abd.  Other Exercises: toileting: pt required CGA to manage clothing and min verbal for safety awareness. Followed cues well.     Assessment/Plan    PT Assessment Patient needs continued PT services  PT Problem List Decreased strength;Decreased range of motion;Decreased activity tolerance;Decreased balance;Decreased coordination;Pain;Decreased safety awareness       PT Treatment Interventions DME instruction;Gait training;Stair training;Functional mobility training;Therapeutic activities;Therapeutic exercise;Balance training;Neuromuscular re-education;Patient/family education    PT Goals (Current goals can be found in the Care Plan section)  Acute Rehab PT Goals Patient Stated Goal: to go home today PT Goal Formulation:  With patient Time For Goal Achievement: 01/31/17 Potential to Achieve Goals: Good     Frequency BID   Barriers to discharge        Co-evaluation               AM-PAC PT "6 Clicks" Daily Activity  Outcome Measure Difficulty turning over in bed (including adjusting bedclothes, sheets and blankets)?: A Little Difficulty moving from lying on back to sitting on the side of the bed? : A Little Difficulty sitting down on and standing up from a chair with arms (e.g., wheelchair, bedside commode, etc,.)?: A Little Help needed moving to and from a bed to chair (including a wheelchair)?: A Little Help needed walking in hospital room?: A Little Help needed climbing 3-5 steps with a railing? : A Little 6 Click Score: 18    End of Session Equipment Utilized During Treatment: Gait belt Activity Tolerance: Patient limited by pain;Other (comment) (anxiety ) Patient left: in bed;with call bell/phone within reach;with bed alarm set Nurse Communication: Mobility status PT Visit Diagnosis: Unsteadiness on feet (R26.81);Muscle weakness (generalized) (M62.81);Pain Pain - Right/Left: Right Pain - part of body: Shoulder    Time: 4174-0814 PT Time Calculation (min) (ACUTE ONLY): 44 min   Charges:         PT G Codes:        Oran Rein PT, SPT  Bevelyn Ngo 01/17/2017, 12:31 PM

## 2017-01-17 NOTE — Anesthesia Procedure Notes (Addendum)
Anesthesia Regional Block: Interscalene brachial plexus block   Pre-Anesthetic Checklist: ,, timeout performed, Correct Patient, Correct Site, Correct Laterality, Correct Procedure, Correct Position, site marked, Risks and benefits discussed,  Surgical consent,  Pre-op evaluation,  At surgeon's request and post-op pain management  Laterality: Right  Prep: alcohol swabs       Needles:  Injection technique: Single-shot  Needle Type: Stimiplex     Needle Length: 9cm  Needle Gauge: 21     Additional Needles:   Procedures: ultrasound guided,,,,,,,,  Narrative:  Start time: 01/17/2017 1:10 PM End time: 01/17/2017 1:20 PM Injection made incrementally with aspirations every 5 mL.  Performed by: Personally  Anesthesiologist: Elpidio Thielen  Additional Notes: Functioning IV was confirmed and monitors were applied.  A Stimuplex needle was used. Sterile prep and drape,hand hygiene and sterile gloves were used.  Negative aspiration and negative test dose prior to incremental administration of local anesthetic. The patient tolerated the procedure well.

## 2017-01-17 NOTE — Care Management Note (Signed)
Case Management Note  Patient Details  Name: Yesenia Shepard MRN: [id number] Date of Birth: 11-May-1951  Subjective/Objective:   Met with patient at bedside to deliver observation notice. POD # 1 right rotator cuff repair.  She states she lives with her boyfriend. Prior to admission, patient was independent and active. PCP is Thersa Salt, last seen 07/09.  RNCM does not anticipate any home health needs. Will discuss with Dr. Cindi Carbon to orders of any follow up needs                 Action/Plan: No need anticipated at time of assessment.   Expected Discharge Date:  01/17/17               Expected Discharge Plan:     In-House Referral:     Discharge planning Services  CM Consult  Post Acute Care Choice:    Choice offered to:     DME Arranged:    DME Agency:     HH Arranged:    HH Agency:     Status of Service:  In process, will continue to follow  If discussed at Long Length of Stay Meetings, dates discussed:    Additional Comments:  Jolly Mango, RN 01/17/2017, 9:40 AM

## 2017-01-17 NOTE — Evaluation (Signed)
Occupational Therapy Evaluation Patient Details Name: Yesenia Shepard MRN: [id number] DOB: 05-31-1951 Today's Date: 01/17/2017    History of Present Illness Pt is a 66 yo F admitted to acute care on 01/16/17 for R rotator cuff repair. Pt currently post-op day 1. Prior to admission, pt independent with ADL's/IADL's, using no AD. PMH: anxiety, arthritis, depression, chronic pain, drug abuse, hepatitis C, HTN, panic attacks, PVD, pre-diabetes, and UTI.    Clinical Impression   Pt seen for OT evaluation this date, s/p R rotator cuff repair, POD#1. Pt independent at baseline, living with her partner [name] (uses RW) and pt was performing all meal prep, laundry, assisting partner with bathing, and pet care at home due to partner's limited ability to assist. Pt lives in 1 story home with 2 steps and no rails to get inside. Pt has a tub shower with a shower chair available to use. Pt has a small 9yo dog who she enjoys taking on walks. Pt presents with significant pain throughout session. Notably, pt reported 10/10 pain at start of session and reported her arm felt wet and wanted the polar care off. OT noticed that the polar care was not properly positioned. OT applied a towel between her arm and the polar care which pt reported much relief from. Pt educated in positioning and use of polar care as well as sling positioning and care to support maximal benefit. Pt educated in 1 handed dressing techniques as well as home/routines modifications to maximize safety and functional independence with ADL. Pt educated in cognitive behavioral pain coping strategies and pursed lip breathing techniques to support pain management. Pt verbalized understanding to all education/training provided. Was in too much pain to trial strategies for dressing during session. RN in at end of session, notified of polar care and pain levels. Pt will benefit from continued skilled OT services to address noted impairments (see OT problem list below)  and functional deficits in self care tasks in order to maximize return to PLOF and minimize risk of falls. Recommend OP OT services following hospitalization to continue addressing OT goals.     Follow Up Recommendations  Outpatient OT    Equipment Recommendations  None recommended by OT    Recommendations for Other Services       Precautions / Restrictions Precautions Precautions: Shoulder Type of Shoulder Precautions: Excessive ROM in all directions Shoulder Interventions: Shoulder sling/immobilizer;Shoulder abduction pillow;At all times;Off for dressing/bathing/exercises Precaution Booklet Issued: No Precaution Comments: elbow, wrist and hand ROM okay Restrictions Weight Bearing Restrictions: Yes RUE Weight Bearing: Weight bearing as tolerated      Mobility Bed Mobility             General bed mobility comments: deferred pt in recliner for session  Transfers     Balance Overall balance assessment: Modified Independent                                         ADL either performed or assessed with clinical judgement   ADL Overall ADL's : Needs assistance/impaired         Upper Body Bathing: Minimal assistance;Sitting;Set up Upper Body Bathing Details (indicate cue type and reason): pt educated in techniques for washing RUE underarm, pt verbalized understanding Lower Body Bathing: Sitting/lateral leans;Set up;Minimal assistance   Upper Body Dressing : Minimal assistance;Cueing for compensatory techniques;Sitting Upper Body Dressing Details (indicate cue type and reason): pt  educated in one handed dressing techniques with visual demonstration, pt verbalized understanding.  In too much pain to attempt during session. Lower Body Dressing: Minimal assistance;Set up;Sitting/lateral leans;Sit to/from stand   Toilet Transfer: Min guard;Ambulation;Regular Toilet           Functional mobility during ADLs: Min guard General ADL Comments: pt  generally min assist for ADL tasks     Vision Baseline Vision/History: Wears glasses Wears Glasses: At all times Patient Visual Report: No change from baseline Vision Assessment?: No apparent visual deficits     Perception     Praxis      Pertinent Vitals/Pain Pain Assessment: 0-10 Pain Score: 8  Faces Pain Scale: Hurts even more Pain Location: R shoulder Pain Descriptors / Indicators: Constant;Discomfort;Grimacing;Operative site guarding;Pressure Pain Intervention(s): Repositioned;RN gave pain meds during session;Patient requesting pain meds-RN notified;Ice applied;Monitored during session;Limited activity within patient's tolerance;Utilized relaxation techniques     Hand Dominance Right   Extremity/Trunk Assessment Upper Extremity Assessment Upper Extremity Assessment: Generalized weakness;RUE deficits/detail RUE Deficits / Details: RUE immobilized in sling with abduction pillow RUE: Unable to fully assess due to immobilization;Unable to fully assess due to pain   Lower Extremity Assessment Lower Extremity Assessment: Overall WFL for tasks assessed   Cervical / Trunk Assessment Cervical / Trunk Assessment: Normal   Communication Communication Communication: No difficulties   Cognition Arousal/Alertness: Awake/alert Behavior During Therapy: Anxious Overall Cognitive Status: Within Functional Limits for tasks assessed                                    General Comments  polar care directly on pt's skin and surgical bandaging, pt complaining of significant discomfort/pain, RUE red and cold to touch wear polar care was in contact. Placed towel between polar care and skin and reapplied, educating pt on proper positioning of polar care for maximum comfort/benefit. Pt reported improved comfort. RN notified at end of session.    Exercises Other Exercises Other Exercises: pt educated in elbow, wrist, and hand ROM exercises to perform, educated in shoulder  precautions, pt verbalized understanding Other Exercises: pt educated in polar care and shoulder immobilization sling wear and placement to maximize comfort/benefit, pt verbalized understanding Other Exercises: pt educated in pursed lip breathing and cognitive behavioral pain coping strategies to support better pain mgt, pt demo'd understanding   Shoulder Instructions      Home Living Family/patient expects to be discharged to:: Private residence Living Arrangements: Spouse/significant other (partner [name]) Available Help at Discharge: Family;Friend(s);Available PRN/intermittently;Available 24 hours/day [name] available 24/7 (limited physical assist), friend PRN ) Type of Home: House Home Access: Stairs to enter [employer] of Steps: 2 Entrance Stairs-Rails: None Home Layout: One level     Bathroom Shower/Tub: Teacher, early years/pre: Standard     Home Equipment: Civil engineer, contracting;Adaptive equipment Adaptive Equipment: Reacher Additional Comments: Independent with no AD prior to [place].       Prior Functioning/Environment Level of Independence: Independent        Comments: indep with ADL/IADL including driving, enjoys taking her small dog on walks; no falls in past 12 months; partner is slightly limited (uses RW) and pt does laundry, meals, and helps partner wash his back during bathing        OT Problem List: Decreased strength;Pain;Decreased range of motion;Impaired UE functional use      OT Treatment/Interventions: Self-care/ADL training;Therapeutic exercise;Therapeutic activities;DME and/or AE instruction;Patient/family education  OT Goals(Current goals can be found in the care plan section) Acute Rehab OT Goals Patient Stated Goal: have less pain OT Goal Formulation: With patient Time For Goal Achievement: 01/24/17 Potential to Achieve Goals: Good  OT Frequency: Min 2X/week   Barriers to D/C:            Co-evaluation               AM-PAC PT "6 Clicks" Daily Activity     Outcome Measure Help from another person eating meals?: None Help from another person taking care of personal grooming?: A Little Help from another person toileting, which includes using toliet, bedpan, or urinal?: A Little Help from another person bathing (including washing, rinsing, drying)?: A Little Help from another person to put on and taking off regular upper body clothing?: A Little Help from another person to put on and taking off regular lower body clothing?: A Little 6 Click Score: 19   End of Session Nurse Communication: Patient requests pain meds;Other (comment) (need more ice for polar care)  Activity Tolerance: Patient limited by pain Patient left: in chair;with call bell/phone within reach;with chair alarm set;with nursing/sitter in room;Other (comment) (polar care and shoulder sling in place)  OT Visit Diagnosis: Pain;Muscle weakness (generalized) (M62.81) Pain - Right/Left: Right Pain - part of body: Shoulder                Time: 6767-2094 OT Time Calculation (min): 51 min Charges:  OT General Charges $OT Visit: 1 Procedure OT Evaluation $OT Eval Low Complexity: 1 Procedure OT Treatments $Self Care/Home Management : 8-22 mins $Therapeutic Activity: 8-22 mins G-Codes: OT G-codes **NOT FOR INPATIENT CLASS** Functional Assessment Tool Used: Clinical judgement;AM-PAC 6 Clicks Daily Activity Functional Limitation: Self care Self Care Current Status (B0962): At least 40 percent but less than 60 percent impaired, limited or restricted Self Care Goal Status (E3662): At least 20 percent but less than 40 percent impaired, limited or restricted   Jeni Salles, MPH, MS, OTR/L ascom [phone] 01/17/17, 12:04 PM

## 2017-01-17 NOTE — Progress Notes (Signed)
Discharge teachings both written and verbal. All questions answered. Pt verbalizes understanding and agrees to comply. Pt states she has a follow up appt with Dr. Mack Guise. Pt d/ced with prescription for oxycodone. Dr. Mack Guise aware pt's LBM was 7/10. Pt has had a bottle of mag citrate without results. Pt instructed per discharge instructs laxatives and stool softners to use when she gets home. Pt instructed that if she doesn't have a BM in the next day or two she needs to call MD. Pt verb understanding and agrees to comply.

## 2017-01-17 NOTE — Care Management Obs Status (Signed)
[place] NOTIFICATION   Patient Details  Name: Yesenia Shepard MRN: [id number] Date of Birth: 04/02/1951   Medicare Observation Status Notification Given:  Yes    Jolly Mango, RN 01/17/2017, 9:32 AM

## 2017-01-17 NOTE — Discharge Summary (Signed)
Physician Discharge Summary  Patient ID: Yesenia Shepard MRN: [id number] DOB/AGE: [birth year] 66 y.o.  Admit date: 01/16/2017 Discharge date: 01/17/2017  Admission Diagnoses:  M75.121 Complete rotatr-cuff tear/ruptr of r shoulder, not trauma <principal problem not specified>  Discharge Diagnoses:  M75.121 Complete rotatr-cuff tear/ruptr of r shoulder, not trauma Active Problems:   S/P right rotator cuff repair   Past Medical History:  Diagnosis Date  . Anxiety   . Arthritis   . Chronic pain   . Depression   . Drug abuse    History of polysubstance abuse; currently on methadone.  . Hepatitis    History of Hep "C". treated and cured with Harvoni  . History of chicken pox   . Hypertension   . Panic attacks   . Peripheral vascular disease ([place])    stent in place.   . Pre-diabetes   . UTI (urinary tract infection)    History of    Surgeries: Procedure(s): SHOULDER ARTHROSCOPY WITH OPEN ROTATOR CUFF REPAIR on 01/16/2017   Consultants (if any):   Discharged Condition: Improved  Hospital Course: Yesenia Shepard is an 66 y.o. female who was admitted 01/16/2017 with a diagnosis of Full thickness rotator cuff tear of the right shoulder and went to the operating room on 01/16/2017 and underwent an uncomplicated mini open rotator cuff repair.  Patient was admitted postoperatively for pain control.  She was given perioperative antibiotics:  Anti-infectives    Start     Dose/Rate Route Frequency Ordered Stop   01/16/17 2000  ceFAZolin (ANCEF) IVPB 1 g/50 mL premix     1 g 100 mL/hr over 30 Minutes Intravenous Every 6 hours 01/16/17 1840 01/17/17 0809   01/16/17 1218  ceFAZolin (ANCEF) 2-4 GM/100ML-% IVPB    Comments:  Cleatis Polka   : cabinet override      01/16/17 1218 01/16/17 1347   01/16/17 0600  ceFAZolin (ANCEF) IVPB 2g/100 mL premix     2 g 200 mL/hr over 30 Minutes Intravenous On call to O.R. 01/15/17 2221 01/16/17 1402    .  She was given sequential compression devices,  early ambulation for DVT prophylaxis.  She benefited maximally from the hospital stay and there were no complications.    Recent vital signs:  Vitals:   01/17/17 0511 01/17/17 0810  BP: (!) 152/70 (!) 163/65  Pulse: (!) 58 67  Resp:  20  Temp: 98.8 F (37.1 C) 99 F (37.2 C)    Recent laboratory studies:  Lab Results  Component Value Date   HGB 12.9 01/10/2017   HGB 14.4 09/27/2016   HGB 13.6 10/18/2015   Lab Results  Component Value Date   WBC 6.1 01/10/2017   PLT 196 01/10/2017   Lab Results  Component Value Date   INR 1.06 01/10/2017   Lab Results  Component Value Date   NA 139 01/10/2017   K 4.1 01/10/2017   CL 102 01/10/2017   CO2 31 01/10/2017   BUN 16 01/10/2017   CREATININE 0.75 01/10/2017   GLUCOSE 109 (H) 01/10/2017    Discharge Medications:   Allergies as of 01/17/2017   No Known Allergies     Medication List    STOP taking these medications   HYDROcodone-acetaminophen 5-325 MG tablet Commonly known as:  NORCO/VICODIN     TAKE these medications   aspirin 81 MG tablet Take 81 mg by mouth daily.   atorvastatin 40 MG tablet Commonly known as:  LIPITOR TAKE 1 TABLET BY MOUTH ONCE DAILY  clonazePAM 2 MG tablet Commonly known as:  KLONOPIN Take 2 mg by mouth 3 (three) times daily as needed for anxiety.   clopidogrel 75 MG tablet Commonly known as:  PLAVIX TAKE ONE TABLET BY MOUTH ONCE DAILY   docusate sodium 100 MG capsule Commonly known as:  COLACE Take 300 mg by mouth daily.   escitalopram 20 MG tablet Commonly known as:  LEXAPRO Take 20 mg by mouth daily at 3 pm.   folic acid 1 MG tablet Commonly known as:  FOLVITE Take 2 mg by mouth 2 (two) times daily.   ibuprofen 200 MG tablet Commonly known as:  ADVIL,MOTRIN Take 800 mg by mouth daily as needed for mild pain or moderate pain.   lisinopril 10 MG tablet Commonly known as:  PRINIVIL,ZESTRIL Take 1 tablet (10 mg total) by mouth daily.   methadone 10 MG tablet Commonly  known as:  DOLOPHINE Take 120 mg by mouth daily.   oxyCODONE 5 MG immediate release tablet Commonly known as:  Oxy IR/ROXICODONE Take 1-2 tablets (5-10 mg total) by mouth every 4 (four) hours as needed for breakthrough pain.   polyethylene glycol packet Commonly known as:  MIRALAX / GLYCOLAX Take 17 g by mouth daily as needed for mild constipation.   traZODone 100 MG tablet Commonly known as:  DESYREL Take 100 mg by mouth at bedtime.   Vitamin D (Ergocalciferol) 50000 units Caps capsule Commonly known as:  DRISDOL Take 50,000 Units by mouth every 7 (seven) days. Thursdays       Diagnostic Studies: No results found.  Disposition: 01-Home or Self Care  Discharge Instructions    Call MD / Call 911    Complete by:  As directed    If you experience chest pain or shortness of breath, CALL 911 and be transported to the hospital emergency room.  If you develope a fever above 101 F, pus (white drainage) or increased drainage or redness at the wound, or calf pain, call your surgeon's office.   Constipation Prevention    Complete by:  As directed    Drink plenty of fluids.  Prune juice may be helpful.  You may use a stool softener, such as Colace (over the counter) 100 mg twice a day.  Use MiraLax (over the counter) for constipation as needed.   Diet general    Complete by:  As directed    Discharge instructions    Complete by:  As directed    Wear sling at all times, including sleep.  You will need to use the sling for a total of 4 weeks following surgery.  Do not try and lift your arm up or away from your body for any reason.   Keep the dressing dry.  You may remove bandage in 3 days.  Leave the Steri-Strips (white medical tape) in place.  You may place additional Band-Aids over top of the Steri-Strips if you wish.  May shower once dressing is removed in 3 days.  Remove sling carefully only for showers, leaving arm down by your side while in the shower.  If the the pain medication  causes itching try taking Benadryl.  You may be most comfortable sleeping in a recliner.  If you do sleep in near bed, placed pillows behind the shoulder that have the operation to support it.   Driving restrictions    Complete by:  As directed    No driving for 4 weeks   Increase activity slowly as tolerated  Complete by:  As directed    Lifting restrictions    Complete by:  As directed    No lifting for 12-16 weeks         Signed: Thornton Park ,MD 01/17/2017, 6:54 PM

## 2017-01-17 NOTE — Progress Notes (Signed)
  Subjective:  Stop day #1 status post right shoulder rotator cuff repair.  Patient reports pain as mild to moderate.  Patient's pain is dramatically improved since yesterday. She would like to go home.  Objective:   VITALS:   Vitals:   01/16/17 1914 01/16/17 2238 01/17/17 0511 01/17/17 0810  BP: (!) 138/57 134/60 (!) 152/70 (!) 163/65  Pulse: 60 66 (!) 58 67  Resp: 17 18  20   Temp: 98.7 F (37.1 C) 98.2 F (36.8 C) 98.8 F (37.1 C) 99 F (37.2 C)  TempSrc: Oral Oral Oral Oral  SpO2: 94% 98% 91% 96%  Weight:      Height:        PHYSICAL EXAM: Right shoulder: Patient's dressing is clean dry and intact. Polar Care is in place. Patient is wearing her abduction sling. Neurovascular intact Sensation intact distally Intact pulses distally Compartment soft  LABS  No results found for this or any previous visit (from the past 24 hour(s)).  No results found.  Assessment/Plan: 1 Day Post-Op   Active Problems:   S/P right rotator cuff repair  Patient doing well postop. Patient be discharged home. She is tender postop bowel movement but was instructed to continue taking a stool softener and laxative. She states she uses MiraLAX at home. She was instructed to return to the hospital ER if she has any abdominal pain or distention.  Patient will continue wearing her abduction sling at all times at home. She may continue using her Polar Care for swelling and pain control. She understands not to attempt to forward elevate or abduct her right shoulder. She will follow up with me next week in the clinic.    Thornton Park , MD 01/17/2017, 6:46 PM

## 2017-01-17 NOTE — Progress Notes (Signed)
Physical Therapy Treatment Patient Details Name: Yesenia Shepard MRN: [id number] DOB: April 21, 1951 Today's Date: 01/17/2017    History of Present Illness Pt is a 66 yo F admitted to acute care on 01/16/17 for R rotator cuff repair. Pt currently post-op day 1. Prior to admission, pt independent with ADL's/IADL's, using no AD. PMH: anxiety, arthritis, depression, chronic pain, drug abuse, hepatitis C, HTN, panic attacks, PVD, pre-diabetes, and UTI.     PT Comments    Pt performs bed mobility and tranfers with ModI and ambulation with MinA-CGA. Pt initially with HHA, progressing to CGA; amb total 250 ft, limited only by pain and anxiety. WC follow for safety only as pt is highly anxious and requires cont motivation from SPT to cont with treatment. Pt with slightly less anxiety during this session and demo improved dynamic standing balance. Gait speed is now 2.0 ft/sec, which is typical for her age related peers. Pt requesting pain medication throughout session; nursing notified and reported pt not due for pain medication at this time. Pt cont to present with the following deficits: strength, ROM, endurance, balance, and pain. Overall, pt responded well to today's treatment with no adverse affects; she has met all PT goals. Pt would benefit from cont skilled PT to address the previously mentioned impairments and promote return to PLOF. Currently recommending outpatient PT pending d/c.    Follow Up Recommendations  Outpatient PT     Equipment Recommendations  None recommended by PT    Recommendations for Other Services       Precautions / Restrictions Precautions Precautions: Shoulder Type of Shoulder Precautions: Excessive ROM in all directions to R shoulder Shoulder Interventions: Shoulder sling/immobilizer;Shoulder abduction pillow;At all times;Off for dressing/bathing/exercises Precaution Booklet Issued: Yes (comment) Precaution Comments: elbow, wrist and hand ROM okay Restrictions Weight  Bearing Restrictions: Yes RUE Weight Bearing: Non weight bearing    Mobility  Bed Mobility Overal bed mobility: Modified Independent             General bed mobility comments: ModI with HOB elevated adn requiring use of bed rails. Requires no cues for body mechanics/safety awareness.   Transfers Overall transfer level: Modified independent Equipment used: None Transfers: Sit to/from Stand Sit to Stand: Modified independent (Device/Increase time)         General transfer comment: ModI with STS, requiring no assist/cues; cont to require increased time.   Ambulation/Gait Ambulation/Gait assistance: Min guard;Min assist Ambulation Distance (Feet): 250 Feet Assistive device: 1 person hand held assist;None Gait Pattern/deviations: Step-through pattern Gait velocity: 2.0 Gait velocity interpretation: at or above normal speed for age/gender General Gait Details: Pt amb total of 250 ft; ~180 with HHA and remainder with CGA. Required no cues for body mechancis/safety awareness, though did require motivation to cont amb. Chair follow, only for safety as pt with high pain levels with min motivation to amb.    Stairs            Wheelchair Mobility    Modified Rankin (Stroke Patients Only)       Balance                                            Cognition Arousal/Alertness: Awake/alert Behavior During Therapy: Anxious Overall Cognitive Status: Within Functional Limits for tasks assessed  General Comments: Cont to be anxious, but less than prior session.       Exercises Other Exercises Other Exercises: R trap stretch ~20 sec x3 for relaxation. Exercise packet provided and pt with eduation on correct technique. Pt verblaized understanding.     General Comments        Pertinent Vitals/Pain Pain Assessment: 0-10 Pain Score: 8  Pain Location: R shoulder Pain Descriptors / Indicators:  Constant;Discomfort;Grimacing;Operative site guarding;Pressure    Home Living                      Prior Function            PT Goals (current goals can now be found in the care plan section) Acute Rehab PT Goals Patient Stated Goal: to go home  PT Goal Formulation: With patient Time For Goal Achievement: 01/31/17 Potential to Achieve Goals: Good Progress towards PT goals: Progressing toward goals    Frequency    BID      PT Plan Current plan remains appropriate    Co-evaluation              AM-PAC PT "6 Clicks" Daily Activity  Outcome Measure  Difficulty turning over in bed (including adjusting bedclothes, sheets and blankets)?: A Little Difficulty moving from lying on back to sitting on the side of the bed? : A Little Difficulty sitting down on and standing up from a chair with arms (e.g., wheelchair, bedside commode, etc,.)?: A Little Help needed moving to and from a bed to chair (including a wheelchair)?: A Little Help needed walking in hospital room?: A Little Help needed climbing 3-5 steps with a railing? : A Little 6 Click Score: 18    End of Session Equipment Utilized During Treatment: Gait belt Activity Tolerance: Patient limited by pain;Other (comment) Patient left: in bed;with call bell/phone within reach;with bed alarm set;with SCD's reapplied Nurse Communication: Mobility status PT Visit Diagnosis: Unsteadiness on feet (R26.81);Muscle weakness (generalized) (M62.81);Pain Pain - Right/Left: Right Pain - part of body: Shoulder     Time: 8022-1798 PT Time Calculation (min) (ACUTE ONLY): 19 min  Charges:                       G Codes:       Oran Rein PT, SPT   Bevelyn Ngo 01/17/2017, 4:47 PM

## 2017-01-18 NOTE — H&P (Signed)
The patient has been re-examined, and the chart reviewed, and there have been no interval changes to the documented history and physical.    The risks, benefits, and alternatives have been discussed at length, and the patient is willing to proceed.   

## 2017-02-11 DIAGNOSIS — M25611 Stiffness of right shoulder, not elsewhere classified: Secondary | ICD-10-CM | POA: Diagnosis not present

## 2017-02-11 DIAGNOSIS — M25511 Pain in right shoulder: Secondary | ICD-10-CM | POA: Diagnosis not present

## 2017-02-14 DIAGNOSIS — M25511 Pain in right shoulder: Secondary | ICD-10-CM | POA: Diagnosis not present

## 2017-02-14 DIAGNOSIS — M25611 Stiffness of right shoulder, not elsewhere classified: Secondary | ICD-10-CM | POA: Diagnosis not present

## 2017-02-15 ENCOUNTER — Ambulatory Visit: Payer: Medicare Other | Admitting: Family Medicine

## 2017-02-19 DIAGNOSIS — M25511 Pain in right shoulder: Secondary | ICD-10-CM | POA: Diagnosis not present

## 2017-02-19 DIAGNOSIS — M25611 Stiffness of right shoulder, not elsewhere classified: Secondary | ICD-10-CM | POA: Diagnosis not present

## 2017-02-21 DIAGNOSIS — M25511 Pain in right shoulder: Secondary | ICD-10-CM | POA: Diagnosis not present

## 2017-02-21 DIAGNOSIS — M25611 Stiffness of right shoulder, not elsewhere classified: Secondary | ICD-10-CM | POA: Diagnosis not present

## 2017-02-26 DIAGNOSIS — M25511 Pain in right shoulder: Secondary | ICD-10-CM | POA: Diagnosis not present

## 2017-02-26 DIAGNOSIS — M25611 Stiffness of right shoulder, not elsewhere classified: Secondary | ICD-10-CM | POA: Diagnosis not present

## 2017-02-28 DIAGNOSIS — M25611 Stiffness of right shoulder, not elsewhere classified: Secondary | ICD-10-CM | POA: Diagnosis not present

## 2017-02-28 DIAGNOSIS — M25511 Pain in right shoulder: Secondary | ICD-10-CM | POA: Diagnosis not present

## 2017-03-05 DIAGNOSIS — M25511 Pain in right shoulder: Secondary | ICD-10-CM | POA: Diagnosis not present

## 2017-03-05 DIAGNOSIS — M25611 Stiffness of right shoulder, not elsewhere classified: Secondary | ICD-10-CM | POA: Diagnosis not present

## 2017-03-07 DIAGNOSIS — M25611 Stiffness of right shoulder, not elsewhere classified: Secondary | ICD-10-CM | POA: Diagnosis not present

## 2017-03-07 DIAGNOSIS — M25511 Pain in right shoulder: Secondary | ICD-10-CM | POA: Diagnosis not present

## 2017-03-13 ENCOUNTER — Other Ambulatory Visit: Payer: Self-pay | Admitting: Family Medicine

## 2017-03-13 DIAGNOSIS — M25511 Pain in right shoulder: Secondary | ICD-10-CM | POA: Diagnosis not present

## 2017-03-13 DIAGNOSIS — M25611 Stiffness of right shoulder, not elsewhere classified: Secondary | ICD-10-CM | POA: Diagnosis not present

## 2017-03-19 DIAGNOSIS — M25511 Pain in right shoulder: Secondary | ICD-10-CM | POA: Diagnosis not present

## 2017-03-19 DIAGNOSIS — M25611 Stiffness of right shoulder, not elsewhere classified: Secondary | ICD-10-CM | POA: Diagnosis not present

## 2017-03-21 DIAGNOSIS — M25611 Stiffness of right shoulder, not elsewhere classified: Secondary | ICD-10-CM | POA: Diagnosis not present

## 2017-03-21 DIAGNOSIS — M25511 Pain in right shoulder: Secondary | ICD-10-CM | POA: Diagnosis not present

## 2017-03-26 DIAGNOSIS — M25511 Pain in right shoulder: Secondary | ICD-10-CM | POA: Diagnosis not present

## 2017-03-26 DIAGNOSIS — M25611 Stiffness of right shoulder, not elsewhere classified: Secondary | ICD-10-CM | POA: Diagnosis not present

## 2017-03-28 ENCOUNTER — Ambulatory Visit (INDEPENDENT_AMBULATORY_CARE_PROVIDER_SITE_OTHER): Payer: Medicare Other | Admitting: Vascular Surgery

## 2017-03-28 ENCOUNTER — Ambulatory Visit (INDEPENDENT_AMBULATORY_CARE_PROVIDER_SITE_OTHER): Payer: Medicare Other

## 2017-03-28 ENCOUNTER — Encounter (INDEPENDENT_AMBULATORY_CARE_PROVIDER_SITE_OTHER): Payer: Medicare Other

## 2017-03-28 ENCOUNTER — Encounter (INDEPENDENT_AMBULATORY_CARE_PROVIDER_SITE_OTHER): Payer: Self-pay | Admitting: Vascular Surgery

## 2017-03-28 VITALS — BP 148/70 | HR 59 | Resp 16 | Wt 143.0 lb

## 2017-03-28 DIAGNOSIS — I10 Essential (primary) hypertension: Secondary | ICD-10-CM

## 2017-03-28 DIAGNOSIS — I739 Peripheral vascular disease, unspecified: Secondary | ICD-10-CM

## 2017-03-28 DIAGNOSIS — G8929 Other chronic pain: Secondary | ICD-10-CM | POA: Diagnosis not present

## 2017-03-28 DIAGNOSIS — E785 Hyperlipidemia, unspecified: Secondary | ICD-10-CM | POA: Diagnosis not present

## 2017-03-28 DIAGNOSIS — M25611 Stiffness of right shoulder, not elsewhere classified: Secondary | ICD-10-CM | POA: Diagnosis not present

## 2017-03-28 DIAGNOSIS — E119 Type 2 diabetes mellitus without complications: Secondary | ICD-10-CM

## 2017-03-28 DIAGNOSIS — M25511 Pain in right shoulder: Secondary | ICD-10-CM | POA: Diagnosis not present

## 2017-03-28 NOTE — Progress Notes (Signed)
MRN : [id number]  Yesenia Shepard is a 66 y.o. (July 08, 1951) female who presents with chief complaint of  Chief Complaint  Patient presents with  . Follow-up    2-67mo aorta iliac and abi  .  History of Present Illness: The patient returns to the office for followup and review of the noninvasive studies. There have been no interval changes in lower extremity symptoms. No interval shortening of the patient's claudication distance or development of rest pain symptoms. No new ulcers or wounds have occurred since the last visit.  There have been no significant changes to the patient's overall health care.  The patient denies amaurosis fugax or recent TIA symptoms. There are no recent neurological changes noted. The patient denies history of DVT, PE or superficial thrombophlebitis. The patient denies recent episodes of angina or shortness of breath.   ABI Rt=1.17 and Lt=1.17  (previous ABI's Rt=0.94 and Lt=1.34) Duplex ultrasound of the aorta iliac system shows the left external iliac stent is widely patent.   Current Meds  Medication Sig  . aspirin 81 MG tablet Take 81 mg by mouth daily.  Marland Kitchen atorvastatin (LIPITOR) 40 MG tablet TAKE 1 TABLET BY MOUTH ONCE DAILY  . clonazePAM (KLONOPIN) 2 MG tablet Take 2 mg by mouth 3 (three) times daily as needed for anxiety.  . docusate sodium (COLACE) 100 MG capsule Take 300 mg by mouth daily.  Marland Kitchen escitalopram (LEXAPRO) 20 MG tablet Take 20 mg by mouth daily at 3 pm.  . folic acid (FOLVITE) 1 MG tablet Take 2 mg by mouth 2 (two) times daily.  Marland Kitchen ibuprofen (ADVIL,MOTRIN) 200 MG tablet Take 800 mg by mouth daily as needed for mild pain or moderate pain.   Marland Kitchen lisinopril (PRINIVIL,ZESTRIL) 10 MG tablet Take 1 tablet (10 mg total) by mouth daily.  . methadone (DOLOPHINE) 10 MG tablet Take 120 mg by mouth daily.   . polyethylene glycol (MIRALAX / GLYCOLAX) packet Take 17 g by mouth daily as needed for mild constipation.  . traZODone (DESYREL) 100 MG tablet Take  100 mg by mouth at bedtime.  . Vitamin D, Ergocalciferol, (DRISDOL) 50000 units CAPS capsule Take 50,000 Units by mouth every 7 (seven) days. Thursdays    Past Medical History:  Diagnosis Date  . Anxiety   . Arthritis   . Chronic pain   . Depression   . Drug abuse    History of polysubstance abuse; currently on methadone.  . Hepatitis    History of Hep "C". treated and cured with Harvoni  . History of chicken pox   . Hypertension   . Panic attacks   . Peripheral vascular disease ([place])    stent in place.   . Pre-diabetes   . UTI (urinary tract infection)    History of    Past Surgical History:  Procedure Laterality Date  . ABDOMINAL HYSTERECTOMY  1989  . CHOLECYSTECTOMY  1987  . INTRACAPSULAR CATARACT EXTRACTION Left   . PERIPHERAL VASCULAR CATHETERIZATION Left 05/04/2015   Procedure: Lower Extremity Angiography;  Surgeon: Katha Cabal, MD;  Location: [facility];  Service: Cardiovascular;  Laterality: Left;  . PERIPHERAL VASCULAR CATHETERIZATION Left 05/04/2015   Procedure: Lower Extremity Intervention;  Surgeon: Katha Cabal, MD;  Location: [facility];  Service: Cardiovascular;  Laterality: Left;  . SHOULDER ARTHROSCOPY WITH OPEN ROTATOR CUFF REPAIR Right 01/16/2017   Procedure: SHOULDER ARTHROSCOPY WITH OPEN ROTATOR CUFF REPAIR;  Surgeon: Thornton Park, MD;  Location: [facility];  Service: Orthopedics;  Laterality: Right;  . TONSILLECTOMY AND ADENOIDECTOMY  1971    Social History Social History  Substance Use Topics  . Smoking status: Current Every Day Smoker    Packs/day: 0.50    Years: 30.00    Types: Cigarettes  . Smokeless tobacco: Never Used     Comment: Currently smoking 1/2 ppd  . Alcohol use No    Family History Family History  Problem Relation Age of Onset  . Arthritis Mother   . Mental illness Mother        depression  . Diabetes Mother   . Cancer Mother        pancreatic  . Cancer Father        colon  . Heart  disease Father   . Diabetes Father   . Cancer Daughter        lung    No Known Allergies   REVIEW OF SYSTEMS (Negative unless checked)  Constitutional: [] Weight loss  [] Fever  [] Chills Cardiac: [] Chest pain   [] Chest pressure   [] Palpitations   [] Shortness of breath when laying flat   [] Shortness of breath with exertion. Vascular:  [] Pain in legs with walking   [] Pain in legs at rest  [] History of DVT   [] Phlebitis   [] Swelling in legs   [] Varicose veins   [] Non-healing ulcers Pulmonary:   [] Uses home oxygen   [] Productive cough   [] Hemoptysis   [] Wheeze  [] COPD   [] Asthma Neurologic:  [] Dizziness   [] Seizures   [] History of stroke   [] History of TIA  [] Aphasia   [] Vissual changes   [] Weakness or numbness in arm   [] Weakness or numbness in leg Musculoskeletal:   [] Joint swelling   [] Joint pain   [] Low back pain Hematologic:  [] Easy bruising  [] Easy bleeding   [] Hypercoagulable state   [] Anemic Gastrointestinal:  [] Diarrhea   [] Vomiting  [] Gastroesophageal reflux/heartburn   [] Difficulty swallowing. Genitourinary:  [] Chronic kidney disease   [] Difficult urination  [] Frequent urination   [] Blood in urine Skin:  [] Rashes   [] Ulcers  Psychological:  [] History of anxiety   []  History of major depression.  Physical Examination  Vitals:   03/28/17 0945  BP: (!) 148/70  Pulse: (!) 59  Resp: 16  Weight: 64.9 kg (143 lb)   Body mass index is 23.08 kg/m. Gen: WD/WN, NAD Head: [place]/AT, No temporalis wasting.  Ear/Nose/Throat: Hearing grossly intact, nares w/o erythema or drainage Eyes: PER, EOMI, sclera nonicteric.  Neck: Supple, no large masses.   Pulmonary:  Good air movement, no audible wheezing bilaterally, no use of accessory muscles.  Cardiac: RRR, no JVD Vascular: no carotid bruits Vessel Right Left  Radial Palpable Palpable  PT Palpable Palpable  DP Palpable Palpable  Gastrointestinal: Non-distended. No guarding/no peritoneal signs.  Musculoskeletal: M/S 5/5 throughout.  No  deformity or atrophy.  Neurologic: CN 2-12 intact. Symmetrical.  Speech is fluent. Motor exam as listed above. Psychiatric: Judgment intact, Mood & affect appropriate for pt's clinical situation. Dermatologic: No rashes or ulcers noted.  No changes consistent with cellulitis. Lymph : No lichenification or skin changes of chronic lymphedema.  CBC Lab Results  Component Value Date   WBC 6.1 01/10/2017   HGB 12.9 01/10/2017   HCT 38.3 01/10/2017   MCV 92.0 01/10/2017   PLT 196 01/10/2017    BMET    Component Value Date/Time   NA 139 01/10/2017 1033   NA 139 02/04/2015 1305   K 4.1 01/10/2017 1033   CL 102 01/10/2017  1033   CO2 31 01/10/2017 1033   GLUCOSE 109 (H) 01/10/2017 1033   BUN 16 01/10/2017 1033   BUN 10 02/04/2015 1305   CREATININE 0.75 01/10/2017 1033   CREATININE 0.76 01/05/2016 1524   CALCIUM 8.9 01/10/2017 1033   GFRNONAA >60 01/10/2017 1033   GFRNONAA 83 01/05/2016 1524   GFRAA >60 01/10/2017 1033   GFRAA >89 01/05/2016 1524   CrCl cannot be calculated (Patient's most recent lab result is older than the maximum 21 days allowed.).  COAG Lab Results  Component Value Date   INR 1.06 01/10/2017   INR 1.17 10/18/2015    Radiology No results found.   Assessment/Plan 1. Peripheral vascular disease ([place])  Recommend:  The patient has evidence of atherosclerosis of the lower extremities with claudication.  The patient does not voice lifestyle limiting changes at this point in time.  Noninvasive studies do not suggest clinically significant change.  No invasive studies, angiography or surgery at this time The patient should continue walking and begin a more formal exercise program.  The patient should continue antiplatelet therapy and aggressive treatment of the lipid abnormalities  No changes in the patient's medications at this time  The patient should continue wearing graduated compression socks 10-15 mmHg strength to control the mild edema.    2.  Essential hypertension Continue antihypertensive medications as already ordered, these medications have been reviewed and there are no changes at this time.   3. Type 2 diabetes mellitus without complication, without long-term current use of insulin (HCC) Continue hypoglycemic medications as already ordered, these medications have been reviewed and there are no changes at this time.  Hgb A1C to be monitored as already arranged by primary service   4. Chronic right shoulder pain S/p successful right shoulder reconstruction  5. Hyperlipidemia, unspecified hyperlipidemia type Continue statin as ordered and reviewed, no changes at this time     Hortencia Pilar, MD  03/28/2017 9:56 AM

## 2017-04-02 DIAGNOSIS — M25511 Pain in right shoulder: Secondary | ICD-10-CM | POA: Diagnosis not present

## 2017-04-02 DIAGNOSIS — M25611 Stiffness of right shoulder, not elsewhere classified: Secondary | ICD-10-CM | POA: Diagnosis not present

## 2017-04-04 DIAGNOSIS — M25611 Stiffness of right shoulder, not elsewhere classified: Secondary | ICD-10-CM | POA: Diagnosis not present

## 2017-04-04 DIAGNOSIS — M25511 Pain in right shoulder: Secondary | ICD-10-CM | POA: Diagnosis not present

## 2017-04-09 DIAGNOSIS — M25511 Pain in right shoulder: Secondary | ICD-10-CM | POA: Diagnosis not present

## 2017-04-09 DIAGNOSIS — M25611 Stiffness of right shoulder, not elsewhere classified: Secondary | ICD-10-CM | POA: Diagnosis not present

## 2017-04-11 DIAGNOSIS — M25512 Pain in left shoulder: Secondary | ICD-10-CM | POA: Diagnosis not present

## 2017-04-11 DIAGNOSIS — M25611 Stiffness of right shoulder, not elsewhere classified: Secondary | ICD-10-CM | POA: Diagnosis not present

## 2017-04-11 DIAGNOSIS — M25519 Pain in unspecified shoulder: Secondary | ICD-10-CM | POA: Diagnosis not present

## 2017-04-11 DIAGNOSIS — M25511 Pain in right shoulder: Secondary | ICD-10-CM | POA: Diagnosis not present

## 2017-04-11 DIAGNOSIS — M7542 Impingement syndrome of left shoulder: Secondary | ICD-10-CM | POA: Diagnosis not present

## 2017-04-15 DIAGNOSIS — M25611 Stiffness of right shoulder, not elsewhere classified: Secondary | ICD-10-CM | POA: Diagnosis not present

## 2017-04-15 DIAGNOSIS — M25511 Pain in right shoulder: Secondary | ICD-10-CM | POA: Diagnosis not present

## 2017-04-17 DIAGNOSIS — M25611 Stiffness of right shoulder, not elsewhere classified: Secondary | ICD-10-CM | POA: Diagnosis not present

## 2017-04-17 DIAGNOSIS — M25511 Pain in right shoulder: Secondary | ICD-10-CM | POA: Diagnosis not present

## 2017-04-19 DIAGNOSIS — M25511 Pain in right shoulder: Secondary | ICD-10-CM | POA: Diagnosis not present

## 2017-04-19 DIAGNOSIS — M25611 Stiffness of right shoulder, not elsewhere classified: Secondary | ICD-10-CM | POA: Diagnosis not present

## 2017-04-30 DIAGNOSIS — M25611 Stiffness of right shoulder, not elsewhere classified: Secondary | ICD-10-CM | POA: Diagnosis not present

## 2017-04-30 DIAGNOSIS — M25511 Pain in right shoulder: Secondary | ICD-10-CM | POA: Diagnosis not present

## 2017-05-02 DIAGNOSIS — M25511 Pain in right shoulder: Secondary | ICD-10-CM | POA: Diagnosis not present

## 2017-05-02 DIAGNOSIS — M25611 Stiffness of right shoulder, not elsewhere classified: Secondary | ICD-10-CM | POA: Diagnosis not present

## 2017-05-07 DIAGNOSIS — M25611 Stiffness of right shoulder, not elsewhere classified: Secondary | ICD-10-CM | POA: Diagnosis not present

## 2017-05-07 DIAGNOSIS — M25511 Pain in right shoulder: Secondary | ICD-10-CM | POA: Diagnosis not present

## 2017-05-09 DIAGNOSIS — M25611 Stiffness of right shoulder, not elsewhere classified: Secondary | ICD-10-CM | POA: Diagnosis not present

## 2017-05-09 DIAGNOSIS — M75121 Complete rotator cuff tear or rupture of right shoulder, not specified as traumatic: Secondary | ICD-10-CM | POA: Diagnosis not present

## 2017-05-09 DIAGNOSIS — M25511 Pain in right shoulder: Secondary | ICD-10-CM | POA: Diagnosis not present

## 2017-05-14 DIAGNOSIS — M25511 Pain in right shoulder: Secondary | ICD-10-CM | POA: Diagnosis not present

## 2017-05-14 DIAGNOSIS — M25611 Stiffness of right shoulder, not elsewhere classified: Secondary | ICD-10-CM | POA: Diagnosis not present

## 2017-05-16 DIAGNOSIS — M25511 Pain in right shoulder: Secondary | ICD-10-CM | POA: Diagnosis not present

## 2017-05-16 DIAGNOSIS — M25611 Stiffness of right shoulder, not elsewhere classified: Secondary | ICD-10-CM | POA: Diagnosis not present

## 2017-05-21 DIAGNOSIS — M25511 Pain in right shoulder: Secondary | ICD-10-CM | POA: Diagnosis not present

## 2017-05-21 DIAGNOSIS — M25611 Stiffness of right shoulder, not elsewhere classified: Secondary | ICD-10-CM | POA: Diagnosis not present

## 2017-05-28 DIAGNOSIS — E559 Vitamin D deficiency, unspecified: Secondary | ICD-10-CM | POA: Diagnosis not present

## 2017-05-29 DIAGNOSIS — M25611 Stiffness of right shoulder, not elsewhere classified: Secondary | ICD-10-CM | POA: Diagnosis not present

## 2017-05-29 DIAGNOSIS — M25511 Pain in right shoulder: Secondary | ICD-10-CM | POA: Diagnosis not present

## 2017-06-04 DIAGNOSIS — M25511 Pain in right shoulder: Secondary | ICD-10-CM | POA: Diagnosis not present

## 2017-06-04 DIAGNOSIS — M25611 Stiffness of right shoulder, not elsewhere classified: Secondary | ICD-10-CM | POA: Diagnosis not present

## 2017-06-06 DIAGNOSIS — M25611 Stiffness of right shoulder, not elsewhere classified: Secondary | ICD-10-CM | POA: Diagnosis not present

## 2017-06-06 DIAGNOSIS — M25511 Pain in right shoulder: Secondary | ICD-10-CM | POA: Diagnosis not present

## 2017-06-10 DIAGNOSIS — M25511 Pain in right shoulder: Secondary | ICD-10-CM | POA: Diagnosis not present

## 2017-06-10 DIAGNOSIS — M75121 Complete rotator cuff tear or rupture of right shoulder, not specified as traumatic: Secondary | ICD-10-CM | POA: Diagnosis not present

## 2017-06-14 ENCOUNTER — Other Ambulatory Visit: Payer: Self-pay | Admitting: Orthopedic Surgery

## 2017-06-14 DIAGNOSIS — M25511 Pain in right shoulder: Secondary | ICD-10-CM

## 2017-06-24 ENCOUNTER — Ambulatory Visit
Admission: RE | Admit: 2017-06-24 | Discharge: 2017-06-24 | Disposition: A | Discharge status: Home / Self Care | Payer: Medicare Other | Source: Ambulatory Visit | Attending: Orthopedic Surgery | Admitting: Orthopedic Surgery

## 2017-06-24 DIAGNOSIS — M25511 Pain in right shoulder: Secondary | ICD-10-CM

## 2017-06-24 DIAGNOSIS — R937 Abnormal findings on diagnostic imaging of other parts of musculoskeletal system: Secondary | ICD-10-CM | POA: Diagnosis not present

## 2017-07-15 DIAGNOSIS — M75121 Complete rotator cuff tear or rupture of right shoulder, not specified as traumatic: Secondary | ICD-10-CM | POA: Diagnosis not present

## 2017-07-15 DIAGNOSIS — M25511 Pain in right shoulder: Secondary | ICD-10-CM | POA: Diagnosis not present

## 2017-07-18 ENCOUNTER — Other Ambulatory Visit: Payer: Self-pay | Admitting: Orthopedic Surgery

## 2017-07-18 DIAGNOSIS — G8929 Other chronic pain: Secondary | ICD-10-CM

## 2017-07-18 DIAGNOSIS — M25511 Pain in right shoulder: Principal | ICD-10-CM

## 2017-07-23 DIAGNOSIS — Z23 Encounter for immunization: Secondary | ICD-10-CM | POA: Diagnosis not present

## 2017-07-29 ENCOUNTER — Other Ambulatory Visit: Payer: Medicare Other

## 2017-07-29 ENCOUNTER — Ambulatory Visit
Admission: RE | Admit: 2017-07-29 | Discharge: 2017-07-29 | Disposition: A | Discharge status: Home / Self Care | Payer: Medicare Other | Source: Ambulatory Visit | Attending: Orthopedic Surgery | Admitting: Orthopedic Surgery

## 2017-07-29 DIAGNOSIS — M75121 Complete rotator cuff tear or rupture of right shoulder, not specified as traumatic: Secondary | ICD-10-CM | POA: Insufficient documentation

## 2017-07-29 DIAGNOSIS — G8929 Other chronic pain: Secondary | ICD-10-CM

## 2017-07-29 DIAGNOSIS — M19011 Primary osteoarthritis, right shoulder: Secondary | ICD-10-CM | POA: Diagnosis not present

## 2017-07-29 DIAGNOSIS — M25511 Pain in right shoulder: Secondary | ICD-10-CM

## 2017-07-29 DIAGNOSIS — M25811 Other specified joint disorders, right shoulder: Secondary | ICD-10-CM | POA: Diagnosis not present

## 2017-07-29 DIAGNOSIS — M19012 Primary osteoarthritis, left shoulder: Secondary | ICD-10-CM | POA: Diagnosis not present

## 2017-07-29 MED ORDER — IOPAMIDOL (ISOVUE-200) INJECTION 41%
15.0000 mL | Freq: Once | INTRAVENOUS | Status: AC | PRN
  Administered 2017-07-29: 15 mL
  Filled 2017-07-29: qty 50

## 2017-07-29 MED ORDER — LIDOCAINE HCL (PF) 1 % IJ SOLN
10.0000 mL | Freq: Once | INTRAMUSCULAR | Status: AC
  Administered 2017-07-29: 10 mL
  Filled 2017-07-29: qty 10

## 2017-08-06 DIAGNOSIS — M25511 Pain in right shoulder: Secondary | ICD-10-CM | POA: Diagnosis not present

## 2017-08-06 DIAGNOSIS — M75121 Complete rotator cuff tear or rupture of right shoulder, not specified as traumatic: Secondary | ICD-10-CM | POA: Diagnosis not present

## 2017-09-16 ENCOUNTER — Ambulatory Visit: Payer: Medicare Other

## 2017-09-20 ENCOUNTER — Ambulatory Visit (INDEPENDENT_AMBULATORY_CARE_PROVIDER_SITE_OTHER): Payer: Medicare Other

## 2017-09-20 ENCOUNTER — Other Ambulatory Visit: Payer: Self-pay

## 2017-09-20 VITALS — BP 128/76 | HR 58 | Temp 98.3°F | Resp 16 | Ht 66.0 in | Wt 161.4 lb

## 2017-09-20 DIAGNOSIS — Z0000 Encounter for general adult medical examination without abnormal findings: Secondary | ICD-10-CM

## 2017-09-20 DIAGNOSIS — E2839 Other primary ovarian failure: Secondary | ICD-10-CM | POA: Diagnosis not present

## 2017-09-20 DIAGNOSIS — Z1231 Encounter for screening mammogram for malignant neoplasm of breast: Secondary | ICD-10-CM | POA: Diagnosis not present

## 2017-09-20 DIAGNOSIS — Z1239 Encounter for other screening for malignant neoplasm of breast: Secondary | ICD-10-CM

## 2017-09-20 MED ORDER — LISINOPRIL 10 MG PO TABS: 10.0000 mg | ORAL_TABLET | Freq: Every day | ORAL | 0 refills | Status: DC

## 2017-09-20 MED ORDER — ATORVASTATIN CALCIUM 40 MG PO TABS: 40.0000 mg | ORAL_TABLET | Freq: Every day | ORAL | 0 refills | Status: DC

## 2017-09-20 NOTE — Patient Instructions (Addendum)
Yesenia Shepard , Thank you for taking time to come for your Medicare Wellness Visit. I appreciate your ongoing commitment to your health goals. Please review the following plan we discussed and let me know if I can assist you in the future.   Follow up with Dr. Terese Door as needed.    Mammogram and Dexa Scan ordered; follow as directed.   Have a great day!  These are the goals we discussed: Goals    . DIET - INCREASE WATER INTAKE     Stay hydrated. Drink 64 ounces daily.    . Increase physical activity     Walk for exercise       This is a list of the screening recommended for you and due dates:  Health Maintenance  Topic Date Due  . Eye exam for diabetics  06/29/1960  . Tetanus Vaccine  06/29/1969  . Cologuard (Stool DNA test)  06/29/2000  . DEXA scan (bone density measurement)  06/30/2015  . Mammogram  08/14/2016  . Complete foot exam   08/18/2016  . Pneumonia vaccines (2 of 2 - PPSV23) 08/18/2016  . Flu Shot  02/06/2017  . Hemoglobin A1C  03/30/2017  .  Hepatitis C: One time screening is recommended by Center for Disease Control  (CDC) for  adults born from [birth year] through 1966.   Completed   Bone Densitometry Bone densitometry is an imaging test that uses a special X-ray to measure the amount of calcium and other minerals in your bones (bone density). This test is also known as a bone mineral density test or dual-energy X-ray absorptiometry (DXA). The test can measure bone density at your hip and your spine. It is similar to having a regular X-ray. You may have this test to:  Diagnose a condition that causes weak or thin bones (osteoporosis).  Predict your risk of a broken bone (fracture).  Determine how well osteoporosis treatment is working.  Tell a health care provider about:  Any allergies you have.  All medicines you are taking, including vitamins, herbs, eye drops, creams, and over-the-counter medicines.  Any problems you or family members have had with  anesthetic medicines.  Any blood disorders you have.  Any surgeries you have had.  Any medical conditions you have.  Possibility of pregnancy.  Any other medical test you had within the previous 14 days that used contrast material. What are the risks? Generally, this is a safe procedure. However, problems can occur and may include the following:  This test exposes you to a very small amount of radiation.  The risks of radiation exposure may be greater to unborn children.  What happens before the procedure?  Do not take any calcium supplements for 24 hours before having the test. You can otherwise eat and drink what you usually do.  Take off all metal jewelry, eyeglasses, dental appliances, and any other metal objects. What happens during the procedure?  You may lie on an exam table. There will be an X-ray generator below you and an imaging device above you.  Other devices, such as boxes or braces, may be used to position your body properly for the scan.  You will need to lie still while the machine slowly scans your body.  The images will show up on a computer monitor. What happens after the procedure? You may need more testing at a later time. This information is not intended to replace advice given to you by your health care provider. Make sure you discuss any questions  you have with your health care provider. Document Released: 07/17/2004 Document Revised: 12/01/2015 Document Reviewed: 12/03/2013 Elsevier Interactive Patient Education  2018 [place] A mammogram is an X-ray of the breasts that is done to check for abnormal changes. This procedure can screen for and detect any changes that may suggest breast cancer. A mammogram can also identify other changes and variations in the breast, such as:  Inflammation of the breast tissue (mastitis).  An infected area that contains a collection of pus (abscess).  A fluid-filled sac (cyst).  Fibrocystic  changes. This is when breast tissue becomes denser, which can make the tissue feel rope-like or uneven under the skin.  Tumors that are not cancerous (benign).  Tell a health care provider about:  Any allergies you have.  If you have breast implants.  If you have had previous breast disease, biopsy, or surgery.  If you are breastfeeding.  Any possibility that you could be pregnant, if this applies.  If you are younger than age 82.  If you have a family history of breast cancer. What are the risks? Generally, this is a safe procedure. However, problems may occur, including:  Exposure to radiation. Radiation levels are very low with this test.  The results being misinterpreted.  The need for further tests.  The inability of the mammogram to detect certain cancers.  What happens before the procedure?  Schedule your test about 1-2 weeks after your menstrual period. This is usually when your breasts are the least tender.  If you have had a mammogram done at a different facility in the past, get the mammogram X-rays or have them sent to your current exam facility in order to compare them.  Wash your breasts and under your arms the day of the test.  Do not wear deodorants, perfumes, lotions, or powders anywhere on your body on the day of the test.  Remove any jewelry from your neck.  Wear clothes that you can change into and out of easily. What happens during the procedure?  You will undress from the waist up and put on a gown.  You will stand in front of the X-ray machine.  Each breast will be placed between two plastic or glass plates. The plates will compress your breast for a few seconds. Try to stay as relaxed as possible during the procedure. This does not cause any harm to your breasts and any discomfort you feel will be very brief.  X-rays will be taken from different angles of each breast. The procedure may vary among health care providers and hospitals. What  happens after the procedure?  The mammogram will be examined by a specialist (radiologist).  You may need to repeat certain parts of the test, depending on the quality of the images. This is commonly done if the radiologist needs a better view of the breast tissue.  Ask when your test results will be ready. Make sure you get your test results.  You may resume your normal activities. This information is not intended to replace advice given to you by your health care provider. Make sure you discuss any questions you have with your health care provider. Document Released: 06/22/2000 Document Revised: 11/28/2015 Document Reviewed: 09/03/2014 Elsevier Interactive Patient Education  [employer].

## 2017-09-20 NOTE — Progress Notes (Signed)
Subjective:   Yesenia Shepard is a 67 y.o. female who presents for Medicare Annual (Subsequent) preventive examination.  Review of Systems:  No ROS.  Medicare Wellness Visit. Additional risk factors are reflected in the social history.  Cardiac Risk Factors include: advanced age (>7men, >90 women);hypertension;diabetes mellitus     Objective:     Vitals: BP 128/76 (BP Location: Left Arm, Patient Position: Sitting, Cuff Size: Normal)   Pulse (!) 58   Temp 98.3 F (36.8 C) (Oral)   Resp 16   Ht 5\' 6"  (1.676 m)   Wt 161 lb 6.4 oz (73.2 kg)   SpO2 95%   BMI 26.05 kg/m   Body mass index is 26.05 kg/m.  Advanced Directives 09/20/2017 03/28/2017 01/16/2017 01/16/2017 01/10/2017 09/13/2016  Does Patient Have a Medical Advance Directive? No No No No No No  Would patient like information on creating a medical advance directive? No - Patient declined - No - Patient declined No - Patient declined No - Patient declined No - Patient declined    Tobacco Social History   Tobacco Use  Smoking Status Current Every Day Smoker  . Packs/day: 0.50  . Years: 30.00  . Pack years: 15.00  . Types: Cigarettes  Smokeless Tobacco Never Used  Tobacco Comment   Currently smoking 1/2 ppd     Ready to quit: No Counseling given: No Comment: Currently smoking 1/2 ppd   Clinical Intake:  Pre-visit preparation completed: Yes  Pain : No/denies pain     Nutritional Status: BMI of 19-24  Normal Diabetes: Yes  How often do you need to have someone help you when you read instructions, pamphlets, or other written materials from your doctor or pharmacy?: 1 - Never  Interpreter Needed?: No     Past Medical History:  Diagnosis Date  . Anxiety   . Arthritis   . Chronic pain   . Depression   . Drug abuse ([place])    History of polysubstance abuse; currently on methadone.  . Hepatitis    History of Hep "C". treated and cured with Harvoni  . History of chicken pox   . Hypertension   . Panic attacks    . Peripheral vascular disease ([place])    stent in place.   . Pre-diabetes   . UTI (urinary tract infection)    History of   Past Surgical History:  Procedure Laterality Date  . ABDOMINAL HYSTERECTOMY  1989  . CHOLECYSTECTOMY  1987  . INTRACAPSULAR CATARACT EXTRACTION Left   . PERIPHERAL VASCULAR CATHETERIZATION Left 05/04/2015   Procedure: Lower Extremity Angiography;  Surgeon: Katha Cabal, MD;  Location: [facility];  Service: Cardiovascular;  Laterality: Left;  . PERIPHERAL VASCULAR CATHETERIZATION Left 05/04/2015   Procedure: Lower Extremity Intervention;  Surgeon: Katha Cabal, MD;  Location: [facility];  Service: Cardiovascular;  Laterality: Left;  . SHOULDER ARTHROSCOPY WITH OPEN ROTATOR CUFF REPAIR Right 01/16/2017   Procedure: SHOULDER ARTHROSCOPY WITH OPEN ROTATOR CUFF REPAIR;  Surgeon: Thornton Park, MD;  Location: [facility];  Service: Orthopedics;  Laterality: Right;  . TONSILLECTOMY AND ADENOIDECTOMY  1971   Family History  Problem Relation Age of Onset  . Arthritis Mother   . Mental illness Mother        depression  . Diabetes Mother   . Cancer Mother        pancreatic  . Cancer Father        colon  . Heart disease Father   .  Diabetes Father   . Cancer Daughter        lung   Social History   Socioeconomic History  . Marital status: Divorced    Spouse name: None  . Number of children: None  . Years of education: None  . Highest education level: None  Social Needs  . Financial resource strain: Not hard at all  . Food insecurity - worry: Never true  . Food insecurity - inability: Never true  . Transportation needs - medical: No  . Transportation needs - non-medical: No  Occupational History  . None  Tobacco Use  . Smoking status: Current Every Day Smoker    Packs/day: 0.50    Years: 30.00    Pack years: 15.00    Types: Cigarettes  . Smokeless tobacco: Never Used  . Tobacco comment: Currently smoking 1/2 ppd  Substance  and Sexual Activity  . Alcohol use: No    Alcohol/week: 0.0 oz  . Drug use: No    Comment: Hx of.  . Sexual activity: Not Currently  Other Topics Concern  . None  Social History Narrative  . None    Outpatient Encounter Medications as of 09/20/2017  Medication Sig  . aspirin 81 MG tablet Take 81 mg by mouth daily.  Marland Kitchen atorvastatin (LIPITOR) 40 MG tablet TAKE 1 TABLET BY MOUTH ONCE DAILY  . clonazePAM (KLONOPIN) 2 MG tablet Take 2 mg by mouth 3 (three) times daily as needed for anxiety.  . clopidogrel (PLAVIX) 75 MG tablet TAKE ONE TABLET BY MOUTH ONCE DAILY  . docusate sodium (COLACE) 100 MG capsule Take 300 mg by mouth daily.  Marland Kitchen escitalopram (LEXAPRO) 20 MG tablet Take 20 mg by mouth daily at 3 pm.  . folic acid (FOLVITE) 1 MG tablet Take 2 mg by mouth 2 (two) times daily.  Marland Kitchen ibuprofen (ADVIL,MOTRIN) 200 MG tablet Take 800 mg by mouth daily as needed for mild pain or moderate pain.   Marland Kitchen lisinopril (PRINIVIL,ZESTRIL) 10 MG tablet Take 1 tablet (10 mg total) by mouth daily.  . methadone (DOLOPHINE) 10 MG tablet Take 120 mg by mouth daily.   . polyethylene glycol (MIRALAX / GLYCOLAX) packet Take 17 g by mouth daily as needed for mild constipation.  . traZODone (DESYREL) 100 MG tablet Take 100 mg by mouth at bedtime.  . Vitamin D, Ergocalciferol, (DRISDOL) 50000 units CAPS capsule Take 50,000 Units by mouth every 7 (seven) days. Thursdays  . [DISCONTINUED] oxyCODONE (OXY IR/ROXICODONE) 5 MG immediate release tablet Take 1-2 tablets (5-10 mg total) by mouth every 4 (four) hours as needed for breakthrough pain.   No facility-administered encounter medications on file as of 09/20/2017.     Activities of Daily Living In your present state of health, do you have any difficulty performing the following activities: 09/20/2017 01/16/2017  Hearing? N -  Vision? N -  Difficulty concentrating or making decisions? Y -  Comment Notes she has difficulty remembering -  Walking or climbing stairs? Y -    Comment C/O intermittent leg pain -  Dressing or bathing? N -  Doing errands, shopping? N N  Preparing Food and eating ? Y -  Comment Notes she is not competent to cook. States she never learned how to cook and is incapable.  Self feeds.  Partner does the cooking.  -  Using the Toilet? N -  In the past six months, have you accidently leaked urine? N -  Do you have problems with loss of bowel control?  N -  Managing your Medications? N -  Managing your Finances? N -  Housekeeping or managing your Housekeeping? N -  Some recent data might be hidden    Patient Care Team: [name], [name], MD as PCP - General (Internal Medicine)    Assessment:   This is a routine wellness examination for Takasha.  The goal of the wellness visit is to assist the patient how to close the gaps in care and create a preventative care plan for the patient.   The roster of all physicians providing medical care to patient is listed in the Snapshot section of the chart.  Taking calcium VIT D as appropriate/Osteoporosis risk reviewed.    Safety issues reviewed; Smoke and carbon monoxide detectors in the home. No firearms in the home. Wears seatbelts when driving or riding with others. No violence in the home.  They do not have excessive sun exposure.  Discussed the need for sun protection: hats, long sleeves and the use of sunscreen if there is significant sun exposure.  Patient is alert, normal appearance, oriented to person/place/and time. Correctly identified the president of the [place] and recalls of 2/3 words.  Performs simple calculations and can read correct time from watch face.   Depression and anxiety-  Discussed not sleeping well, feeling fatigued and sad due to passed issues but mostly due her daughter passing away 11/2016; very tearful during the visit.  Taking trazodone, klonopin, lexapro, and abilify as directed.   Encouraged her to contact her counselor for closer scheduled visits, get outside  to walk for exercise, and journal thoughts for release.  Followed by Dr. Georga Bora, [facility], [place], [place].    BMI- discussed the importance of a healthy diet, water intake and the benefits of aerobic exercise.   24 hour diet recall: Regular diet.  Weight gain of 23lbs since last in office visit 01/2017.  States she has had difficulty managing.  Nutritonal counseling and educational material provided to help with healthy choices and portion control.  States she feels better all the way around after talking today and plans to start taking better care of herself.  Dental- upper dentures.  Eye- Visual acuity not assessed per patient preference.  Annual visits.  Last visit 02/2017. Wears corrective lenses.  Sleep patterns- Sleeps 4-5 hours at night.  Wakes feeling fatigued. Trazodone as directed.  Naps during the day.    Mammogram and Dexa Scan ordered; follow as directed.    TDAP vaccine deferred per patient preference.  Follow up with insurance.  Educational material provided.  Patient Concerns:Refill for lisinopril and lipitor.  Appointment scheduled to establish care with Dr. Jewel Baize.  Pended for approval.  Exercise Activities and Dietary recommendations Current Exercise Habits: The patient does not participate in regular exercise at present  Goals    . DIET - INCREASE WATER INTAKE     Stay hydrated. Drink 64 ounces daily.    . Increase physical activity     Walk for exercise       Fall Risk Fall Risk  09/20/2017 09/13/2016 06/11/2016 02/09/2016  Falls in the past year? Yes No Yes Yes  Number falls in past yr: 1 - 1 2 or more  Injury with Fall? No - No No  Comment She tripped while carrying the trash - - -  Risk Factor Category  - - - High Fall Risk  Follow up Education provided;Falls prevention discussed - Falls evaluation completed;Education provided;Falls prevention discussed Falls evaluation completed  Depression Screen PHQ 2/9 Scores  09/20/2017 09/13/2016 06/11/2016 02/09/2016  PHQ - 2 Score 4 6 6 3   PHQ- 9 Score 12 11 12 7      Cognitive Function     6CIT Screen 09/20/2017  What Year? 0 points  What month? 0 points  What time? 0 points  Count back from 20 0 points  Months in reverse 0 points    Immunization History  Administered Date(s) Administered  . Hepatitis A, Adult 11/09/2015, 12/21/2015, 06/11/2016  . Hepatitis B 06/11/2016  . Hepatitis B, adult 11/09/2015, 12/21/2015  . Influenza-Unspecified 03/09/2016  . Pneumococcal Conjugate-13 08/19/2015    Screening Tests Health Maintenance  Topic Date Due  . OPHTHALMOLOGY EXAM  06/29/1960  . TETANUS/TDAP  06/29/1969  . Fecal DNA (Cologuard)  06/29/2000  . DEXA SCAN  06/30/2015  . MAMMOGRAM  08/14/2016  . FOOT EXAM  08/18/2016  . PNA vac Low Risk Adult (2 of 2 - PPSV23) 08/18/2016  . INFLUENZA VACCINE  02/06/2017  . HEMOGLOBIN A1C  03/30/2017  . Hepatitis C Screening  Completed      Plan:   End of life planning; Advanced aging; Advanced directives discussed.  No HCPOA/Living Will.  Additional information declined at this time.  I have personally reviewed and noted the following in the patient's chart:   . Medical and social history . Use of alcohol, tobacco or illicit drugs  . Current medications and supplements . Functional ability and status . Nutritional status . Physical activity . Advanced directives . List of other physicians . Hospitalizations, surgeries, and ER visits in previous 12 months . Vitals . Screenings to include cognitive, depression, and falls . Referrals and appointments  In addition, I have reviewed and discussed with patient certain preventive protocols, quality metrics, and best practice recommendations. A written personalized care plan for preventive services as well as general preventive health recommendations were provided to patient.     Varney Biles, LPN  8/50/2774

## 2017-10-06 ENCOUNTER — Other Ambulatory Visit: Payer: Self-pay | Admitting: Family Medicine

## 2017-10-07 ENCOUNTER — Other Ambulatory Visit: Payer: Self-pay | Admitting: Family Medicine

## 2017-10-31 ENCOUNTER — Ambulatory Visit: Payer: Medicare Other | Admitting: Internal Medicine

## 2017-10-31 DIAGNOSIS — Z2089 Contact with and (suspected) exposure to other communicable diseases: Secondary | ICD-10-CM

## 2017-11-05 DIAGNOSIS — E559 Vitamin D deficiency, unspecified: Secondary | ICD-10-CM | POA: Diagnosis not present

## 2017-11-21 ENCOUNTER — Other Ambulatory Visit: Payer: Self-pay | Admitting: Internal Medicine

## 2017-11-21 DIAGNOSIS — I10 Essential (primary) hypertension: Secondary | ICD-10-CM

## 2017-11-21 DIAGNOSIS — E785 Hyperlipidemia, unspecified: Secondary | ICD-10-CM

## 2017-11-21 MED ORDER — LISINOPRIL 10 MG PO TABS: 10.0000 mg | ORAL_TABLET | Freq: Every day | ORAL | 0 refills | Status: DC

## 2017-11-21 MED ORDER — ATORVASTATIN CALCIUM 40 MG PO TABS: 40.0000 mg | ORAL_TABLET | Freq: Every day | ORAL | 0 refills | Status: DC

## 2017-11-25 ENCOUNTER — Ambulatory Visit (INDEPENDENT_AMBULATORY_CARE_PROVIDER_SITE_OTHER): Payer: Medicare Other | Admitting: Vascular Surgery

## 2017-12-05 ENCOUNTER — Ambulatory Visit (INDEPENDENT_AMBULATORY_CARE_PROVIDER_SITE_OTHER): Payer: Medicare Other | Admitting: Vascular Surgery

## 2017-12-09 ENCOUNTER — Other Ambulatory Visit (INDEPENDENT_AMBULATORY_CARE_PROVIDER_SITE_OTHER): Payer: Self-pay

## 2017-12-09 ENCOUNTER — Ambulatory Visit (INDEPENDENT_AMBULATORY_CARE_PROVIDER_SITE_OTHER): Payer: Medicare Other | Admitting: Vascular Surgery

## 2017-12-09 ENCOUNTER — Encounter (INDEPENDENT_AMBULATORY_CARE_PROVIDER_SITE_OTHER): Payer: Self-pay | Admitting: Vascular Surgery

## 2017-12-09 VITALS — BP 135/68 | HR 76 | Resp 16 | Ht 66.0 in | Wt 165.8 lb

## 2017-12-09 DIAGNOSIS — I739 Peripheral vascular disease, unspecified: Secondary | ICD-10-CM

## 2017-12-09 DIAGNOSIS — M79605 Pain in left leg: Secondary | ICD-10-CM

## 2017-12-09 DIAGNOSIS — I10 Essential (primary) hypertension: Secondary | ICD-10-CM

## 2017-12-09 DIAGNOSIS — M79604 Pain in right leg: Secondary | ICD-10-CM

## 2017-12-09 DIAGNOSIS — E119 Type 2 diabetes mellitus without complications: Secondary | ICD-10-CM

## 2017-12-09 DIAGNOSIS — E785 Hyperlipidemia, unspecified: Secondary | ICD-10-CM

## 2017-12-09 DIAGNOSIS — R600 Localized edema: Secondary | ICD-10-CM

## 2017-12-09 NOTE — Progress Notes (Signed)
MRN : [id number]  Yesenia Shepard is a 67 y.o. (1951-03-09) female who presents with chief complaint of  Chief Complaint  Patient presents with  . Follow-up    bilateral leg pain,weakness and feet numbness  .  History of Present Illness:  The patient returns to the office for followup because of increased leg pain. There has been a significant deterioration in the lower extremity symptoms.  The patient notes interval shortening of their claudication distance and development of mild rest pain symptoms. No new ulcers or wounds have occurred since the last visit.  There have been no significant changes to the patient's overall health care.  The patient denies amaurosis fugax or recent TIA symptoms. There are no recent neurological changes noted. The patient denies history of DVT, PE or superficial thrombophlebitis. The patient denies recent episodes of angina or shortness of breath.       Current Meds  Medication Sig  . ARIPiprazole (ABILIFY) 10 MG tablet Take 10 mg by mouth daily.  Marland Kitchen aspirin 81 MG tablet Take 81 mg by mouth daily.  Marland Kitchen atorvastatin (LIPITOR) 40 MG tablet Take 1 tablet (40 mg total) by mouth daily.  . clonazePAM (KLONOPIN) 2 MG tablet Take 2 mg by mouth 3 (three) times daily as needed for anxiety.  . clopidogrel (PLAVIX) 75 MG tablet TAKE ONE TABLET BY MOUTH ONCE DAILY  . docusate sodium (COLACE) 100 MG capsule Take 300 mg by mouth daily.  Marland Kitchen escitalopram (LEXAPRO) 20 MG tablet Take 20 mg by mouth daily at 3 pm.  . folic acid (FOLVITE) 1 MG tablet Take 2 mg by mouth 2 (two) times daily.  Marland Kitchen ibuprofen (ADVIL,MOTRIN) 200 MG tablet Take 800 mg by mouth daily as needed for mild pain or moderate pain.   Marland Kitchen lisinopril (PRINIVIL,ZESTRIL) 10 MG tablet Take 1 tablet (10 mg total) by mouth daily.  . methadone (DOLOPHINE) 10 MG tablet Take 120 mg by mouth daily.   . polyethylene glycol (MIRALAX / GLYCOLAX) packet Take 17 g by mouth daily as needed for mild constipation.  .  traZODone (DESYREL) 100 MG tablet Take 100 mg by mouth at bedtime.  . Vitamin D, Ergocalciferol, (DRISDOL) 50000 units CAPS capsule Take 50,000 Units by mouth every 7 (seven) days. Thursdays    Past Medical History:  Diagnosis Date  . Anxiety   . Arthritis   . Chronic pain   . Depression   . Drug abuse ([place])    History of polysubstance abuse; currently on methadone.  . Hepatitis    History of Hep "C". treated and cured with Harvoni  . History of chicken pox   . Hypertension   . Panic attacks   . Peripheral vascular disease ([place])    stent in place.   . Pre-diabetes   . UTI (urinary tract infection)    History of    Past Surgical History:  Procedure Laterality Date  . ABDOMINAL HYSTERECTOMY  1989  . CHOLECYSTECTOMY  1987  . INTRACAPSULAR CATARACT EXTRACTION Left   . PERIPHERAL VASCULAR CATHETERIZATION Left 05/04/2015   Procedure: Lower Extremity Angiography;  Surgeon: Katha Cabal, MD;  Location: [facility];  Service: Cardiovascular;  Laterality: Left;  . PERIPHERAL VASCULAR CATHETERIZATION Left 05/04/2015   Procedure: Lower Extremity Intervention;  Surgeon: Katha Cabal, MD;  Location: [facility];  Service: Cardiovascular;  Laterality: Left;  . SHOULDER ARTHROSCOPY WITH OPEN ROTATOR CUFF REPAIR Right 01/16/2017   Procedure: SHOULDER ARTHROSCOPY WITH OPEN ROTATOR CUFF  REPAIR;  Surgeon: Thornton Park, MD;  Location: [facility];  Service: Orthopedics;  Laterality: Right;  . TONSILLECTOMY AND ADENOIDECTOMY  1971    Social History Social History   Tobacco Use  . Smoking status: Current Every Day Smoker    Packs/day: 0.50    Years: 30.00    Pack years: 15.00    Types: Cigarettes  . Smokeless tobacco: Never Used  . Tobacco comment: Currently smoking 1/2 ppd  Substance Use Topics  . Alcohol use: No    Alcohol/week: 0.0 oz  . Drug use: No    Comment: Hx of.    Family History Family History  Problem Relation Age of Onset  . Arthritis  Mother   . Mental illness Mother        depression  . Diabetes Mother   . Cancer Mother        pancreatic  . Cancer Father        colon  . Heart disease Father   . Diabetes Father   . Cancer Daughter        lung    No Known Allergies   REVIEW OF SYSTEMS (Negative unless checked)  Constitutional: [] Weight loss  [] Fever  [] Chills Cardiac: [] Chest pain   [] Chest pressure   [] Palpitations   [] Shortness of breath when laying flat   [] Shortness of breath with exertion. Vascular:  [x] Pain in legs with walking   [] Pain in legs at rest  [] History of DVT   [] Phlebitis   [x] Swelling in legs   [] Varicose veins   [] Non-healing ulcers Pulmonary:   [] Uses home oxygen   [] Productive cough   [] Hemoptysis   [] Wheeze  [] COPD   [] Asthma Neurologic:  [] Dizziness   [] Seizures   [] History of stroke   [] History of TIA  [] Aphasia   [] Vissual changes   [] Weakness or numbness in arm   [] Weakness or numbness in leg Musculoskeletal:   [] Joint swelling   [] Joint pain   [] Low back pain Hematologic:  [] Easy bruising  [] Easy bleeding   [] Hypercoagulable state   [] Anemic Gastrointestinal:  [] Diarrhea   [] Vomiting  [] Gastroesophageal reflux/heartburn   [] Difficulty swallowing. Genitourinary:  [] Chronic kidney disease   [] Difficult urination  [] Frequent urination   [] Blood in urine Skin:  [] Rashes   [] Ulcers  Psychological:  [] History of anxiety   []  History of major depression.  Physical Examination  Vitals:   12/09/17 1522  BP: 135/68  Pulse: 76  Resp: 16  Weight: 165 lb 12.8 oz (75.2 kg)  Height: 5\' 6"  (1.676 m)   Body mass index is 26.76 kg/m. Gen: WD/WN, NAD Head: [place]/AT, No temporalis wasting.  Ear/Nose/Throat: Hearing grossly intact, nares w/o erythema or drainage Eyes: PER, EOMI, sclera nonicteric.  Neck: Supple, no large masses.   Pulmonary:  Good air movement, no audible wheezing bilaterally, no use of accessory muscles.  Cardiac: RRR, no JVD Vascular:scattered varicosities present  bilaterally.  Mild venous stasis changes to the legs bilaterally.  2+ soft pitting edema Vessel Right Left  Radial Palpable Palpable  PT Not Palpable Not Palpable  DP Not Palpable Not Palpable  Gastrointestinal: Non-distended. No guarding/no peritoneal signs.  Musculoskeletal: M/S 5/5 throughout.  No deformity or atrophy.  Neurologic: CN 2-12 intact. Symmetrical.  Speech is fluent. Motor exam as listed above. Psychiatric: Judgment intact, Mood & affect appropriate for pt's clinical situation. Dermatologic: No rashes or ulcers noted.  No changes consistent with cellulitis. Lymph : No lichenification or skin changes of chronic lymphedema.  CBC Lab Results  Component Value Date   WBC 6.1 01/10/2017   HGB 12.9 01/10/2017   HCT 38.3 01/10/2017   MCV 92.0 01/10/2017   PLT 196 01/10/2017    BMET    Component Value Date/Time   NA 139 01/10/2017 1033   NA 139 02/04/2015 1305   K 4.1 01/10/2017 1033   CL 102 01/10/2017 1033   CO2 31 01/10/2017 1033   GLUCOSE 109 (H) 01/10/2017 1033   BUN 16 01/10/2017 1033   BUN 10 02/04/2015 1305   CREATININE 0.75 01/10/2017 1033   CREATININE 0.76 01/05/2016 1524   CALCIUM 8.9 01/10/2017 1033   GFRNONAA >60 01/10/2017 1033   GFRNONAA 83 01/05/2016 1524   GFRAA >60 01/10/2017 1033   GFRAA >89 01/05/2016 1524   CrCl cannot be calculated (Patient's most recent lab result is older than the maximum 21 days allowed.).  COAG Lab Results  Component Value Date   INR 1.06 01/10/2017   INR 1.17 10/18/2015    Radiology No results found.    Assessment/Plan 1. Pain of left lower extremity Recommend:  Patient should undergo arterial duplex of the lower extremity ASAP because there has been a significant deterioration in the patient's lower extremity symptoms.  The patient states they are having increased pain and a marked decrease in the distance that they can walk.  The risks and benefits as well as the alternatives were discussed in detail with  the patient.  All questions were answered.  Patient agrees to proceed and understands this could be a prelude to angiography and intervention.  The patient will follow up with me in the office to review the studies.  - VAS [place] ABI WITH/WO TBI; Future - VAS [place] LOWER EXTREMITY VENOUS (DVT); Future  2. Peripheral vascular disease ([place]) See #1  3. Essential hypertension Continue antihypertensive medications as already ordered, these medications have been reviewed and there are no changes at this time.   4. Type 2 diabetes mellitus without complication, without long-term current use of insulin (HCC) Continue hypoglycemic medications as already ordered, these medications have been reviewed and there are no changes at this time.  Hgb A1C to be monitored as already arranged by primary service   5. Hyperlipidemia, unspecified hyperlipidemia type Continue statin as ordered and reviewed, no changes at this time    Hortencia Pilar, MD  12/09/2017 3:37 PM

## 2017-12-10 ENCOUNTER — Encounter (INDEPENDENT_AMBULATORY_CARE_PROVIDER_SITE_OTHER): Payer: Medicare Other

## 2017-12-10 ENCOUNTER — Ambulatory Visit
Admission: RE | Admit: 2017-12-10 | Discharge: 2017-12-10 | Disposition: A | Discharge status: Home / Self Care | Payer: Medicare Other | Source: Ambulatory Visit | Attending: Vascular Surgery | Admitting: Vascular Surgery

## 2017-12-10 DIAGNOSIS — M79605 Pain in left leg: Secondary | ICD-10-CM | POA: Diagnosis not present

## 2017-12-10 DIAGNOSIS — R600 Localized edema: Secondary | ICD-10-CM | POA: Diagnosis not present

## 2017-12-10 DIAGNOSIS — R9389 Abnormal findings on diagnostic imaging of other specified body structures: Secondary | ICD-10-CM | POA: Insufficient documentation

## 2017-12-10 DIAGNOSIS — M79604 Pain in right leg: Secondary | ICD-10-CM | POA: Insufficient documentation

## 2017-12-12 ENCOUNTER — Encounter (INDEPENDENT_AMBULATORY_CARE_PROVIDER_SITE_OTHER): Payer: Self-pay | Admitting: Vascular Surgery

## 2017-12-12 ENCOUNTER — Ambulatory Visit (INDEPENDENT_AMBULATORY_CARE_PROVIDER_SITE_OTHER): Payer: Medicare Other | Admitting: Vascular Surgery

## 2017-12-12 ENCOUNTER — Encounter (INDEPENDENT_AMBULATORY_CARE_PROVIDER_SITE_OTHER): Payer: Self-pay

## 2017-12-12 VITALS — BP 127/58 | HR 62 | Resp 16 | Ht 66.0 in | Wt 165.0 lb

## 2017-12-12 DIAGNOSIS — M79606 Pain in leg, unspecified: Secondary | ICD-10-CM | POA: Insufficient documentation

## 2017-12-12 DIAGNOSIS — I10 Essential (primary) hypertension: Secondary | ICD-10-CM | POA: Diagnosis not present

## 2017-12-12 DIAGNOSIS — I739 Peripheral vascular disease, unspecified: Secondary | ICD-10-CM | POA: Diagnosis not present

## 2017-12-12 DIAGNOSIS — E785 Hyperlipidemia, unspecified: Secondary | ICD-10-CM | POA: Diagnosis not present

## 2017-12-12 DIAGNOSIS — M79605 Pain in left leg: Secondary | ICD-10-CM

## 2017-12-12 NOTE — Progress Notes (Signed)
MRN : [id number]  Yesenia Shepard is a 67 y.o. (22-Sep-1950) female who presents with chief complaint of No chief complaint on file. Marland Kitchen  History of Present Illness:   The patient returns to the office for followup and review of the noninvasive studies. There has been a significant deterioration in the lower extremity symptoms.  The patient notes interval shortening of their claudication distance and development of mild rest pain symptoms. No new ulcers or wounds have occurred since the last visit.  There have been no significant changes to the patient's overall health care.  The patient denies amaurosis fugax or recent TIA symptoms. There are no recent neurological changes noted. The patient denies history of DVT, PE or superficial thrombophlebitis. The patient denies recent episodes of angina or shortness of breath.   ABI Rt=0.95 and Lt=0.78  (previous ABI's Rt=1.17 and Lt=1.17)   No outpatient medications have been marked as taking for the 12/12/17 encounter (Appointment) with Delana Meyer, Dolores Lory, MD.    Past Medical History:  Diagnosis Date  . Anxiety   . Arthritis   . Chronic pain   . Depression   . Drug abuse ([place])    History of polysubstance abuse; currently on methadone.  . Hepatitis    History of Hep "C". treated and cured with Harvoni  . History of chicken pox   . Hypertension   . Panic attacks   . Peripheral vascular disease ([place])    stent in place.   . Pre-diabetes   . UTI (urinary tract infection)    History of    Past Surgical History:  Procedure Laterality Date  . ABDOMINAL HYSTERECTOMY  1989  . CHOLECYSTECTOMY  1987  . INTRACAPSULAR CATARACT EXTRACTION Left   . PERIPHERAL VASCULAR CATHETERIZATION Left 05/04/2015   Procedure: Lower Extremity Angiography;  Surgeon: Katha Cabal, MD;  Location: [facility];  Service: Cardiovascular;  Laterality: Left;  . PERIPHERAL VASCULAR CATHETERIZATION Left 05/04/2015   Procedure: Lower Extremity  Intervention;  Surgeon: Katha Cabal, MD;  Location: [facility];  Service: Cardiovascular;  Laterality: Left;  . SHOULDER ARTHROSCOPY WITH OPEN ROTATOR CUFF REPAIR Right 01/16/2017   Procedure: SHOULDER ARTHROSCOPY WITH OPEN ROTATOR CUFF REPAIR;  Surgeon: Thornton Park, MD;  Location: [facility];  Service: Orthopedics;  Laterality: Right;  . TONSILLECTOMY AND ADENOIDECTOMY  1971    Social History Social History   Tobacco Use  . Smoking status: Current Every Day Smoker    Packs/day: 0.50    Years: 30.00    Pack years: 15.00    Types: Cigarettes  . Smokeless tobacco: Never Used  . Tobacco comment: Currently smoking 1/2 ppd  Substance Use Topics  . Alcohol use: No    Alcohol/week: 0.0 oz  . Drug use: No    Comment: Hx of.    Family History Family History  Problem Relation Age of Onset  . Arthritis Mother   . Mental illness Mother        depression  . Diabetes Mother   . Cancer Mother        pancreatic  . Cancer Father        colon  . Heart disease Father   . Diabetes Father   . Cancer Daughter        lung    No Known Allergies   REVIEW OF SYSTEMS (Negative unless checked)  Constitutional: [] Weight loss  [] Fever  [] Chills Cardiac: [] Chest pain   [] Chest pressure   [] Palpitations   []   Shortness of breath when laying flat   [] Shortness of breath with exertion. Vascular:  [x] Pain in legs with walking   [] Pain in legs at rest  [] History of DVT   [] Phlebitis   [] Swelling in legs   [] Varicose veins   [] Non-healing ulcers Pulmonary:   [] Uses home oxygen   [] Productive cough   [] Hemoptysis   [] Wheeze  [] COPD   [] Asthma Neurologic:  [] Dizziness   [] Seizures   [] History of stroke   [] History of TIA  [] Aphasia   [] Vissual changes   [] Weakness or numbness in arm   [] Weakness or numbness in leg Musculoskeletal:   [] Joint swelling   [] Joint pain   [] Low back pain Hematologic:  [] Easy bruising  [] Easy bleeding   [] Hypercoagulable state   [] Anemic Gastrointestinal:   [] Diarrhea   [] Vomiting  [] Gastroesophageal reflux/heartburn   [] Difficulty swallowing. Genitourinary:  [] Chronic kidney disease   [] Difficult urination  [] Frequent urination   [] Blood in urine Skin:  [] Rashes   [] Ulcers  Psychological:  [] History of anxiety   []  History of major depression.  Physical Examination  There were no vitals filed for this visit. There is no height or weight on file to calculate BMI. Gen: WD/WN, NAD Head: [place]/AT, No temporalis wasting.  Ear/Nose/Throat: Hearing grossly intact, nares w/o erythema or drainage Eyes: PER, EOMI, sclera nonicteric.  Neck: Supple, no large masses.   Pulmonary:  Good air movement, no audible wheezing bilaterally, no use of accessory muscles.  Cardiac: RRR, no JVD Vascular: feet cool to touch 2-3 second cap refill Vessel Right Left  Radial Palpable Palpable  Popliteal Not Palpable Not Palpable  PT Not Palpable Not Palpable  DP Not Palpable Not Palpable  Gastrointestinal: Non-distended. No guarding/no peritoneal signs.  Musculoskeletal: M/S 5/5 throughout.  No deformity or atrophy.  Neurologic: CN 2-12 intact. Symmetrical.  Speech is fluent. Motor exam as listed above. Psychiatric: Judgment intact, Mood & affect appropriate for pt's clinical situation. Dermatologic: No rashes or ulcers noted.  No changes consistent with cellulitis. Lymph : No lichenification or skin changes of chronic lymphedema.  CBC Lab Results  Component Value Date   WBC 6.1 01/10/2017   HGB 12.9 01/10/2017   HCT 38.3 01/10/2017   MCV 92.0 01/10/2017   PLT 196 01/10/2017    BMET    Component Value Date/Time   NA 139 01/10/2017 1033   NA 139 02/04/2015 1305   K 4.1 01/10/2017 1033   CL 102 01/10/2017 1033   CO2 31 01/10/2017 1033   GLUCOSE 109 (H) 01/10/2017 1033   BUN 16 01/10/2017 1033   BUN 10 02/04/2015 1305   CREATININE 0.75 01/10/2017 1033   CREATININE 0.76 01/05/2016 1524   CALCIUM 8.9 01/10/2017 1033   GFRNONAA >60 01/10/2017 1033    GFRNONAA 83 01/05/2016 1524   GFRAA >60 01/10/2017 1033   GFRAA >89 01/05/2016 1524   CrCl cannot be calculated (Patient's most recent lab result is older than the maximum 21 days allowed.).  COAG Lab Results  Component Value Date   INR 1.06 01/10/2017   INR 1.17 10/18/2015    Radiology US Arterial Seg Multiple  Result Date: 12/10/2017 CLINICAL DATA:  67 year old female with bilateral lower extremity pain and edema EXAM: NONINVASIVE PHYSIOLOGIC VASCULAR STUDY OF BILATERAL LOWER EXTREMITIES TECHNIQUE: Non-invasive vascular evaluation of both lower extremities was performed at rest, including calculation of ankle-brachial indices, multiple segmental pressure evaluation, segmental Doppler and segmental pulse volume recording. COMPARISON:  None. FINDINGS: Right Lower Extremity Resting ABI:  0.95 Segmental Pressures:  Significant pressure gradient between the thigh and calf cuffs suggesting femoropopliteal disease. Additionally, there is a significant gradient between the calf and ankle suggesting runoff disease. Arterial Waveforms: Biphasic arterial waveforms at the ankle. PVRs: Blunted PVRs at the ankle. Left Lower Extremity: Resting ABI: 0.78 Segmental Pressures: Significant pressure gradient between the thigh and calf cuffs suggesting femoropopliteal disease. Arterial Waveforms: Biphasic arterial waveforms at the ankle. PVRs: Blunted PVRs at the ankle. Other: Symmetric upper extremity pressures. Ankle Brachial index > 1.4 Non diagnostic secondary to incompressible vessel calcifications 1.0-1.4       Normal 0.9-0.99     Borderline PAD 0.8-0.89     Mild PAD 0.5-0.79     Moderate PAD < 0.5          Severe PAD Toe Brachial Index Normal     >0.65 Moderate  0.53-0.64 Severe     <0.23 Toe Pressures Absolute toe pressure >52mmHg sufficient for wound healing. Toe pressures <40mmHg = critical limb ischemia. IMPRESSION: 1. Borderline abnormal resting right ankle-brachial index of 0.95. Segmental evaluation  suggests femoropopliteal disease. 2. Abnormal resting left ankle-brachial index of 0.78 consistent with at least moderate peripheral arterial disease. Segmental evaluation suggests a combination of femoropopliteal and below the knee disease. Signed, Criselda Peaches, MD [facility] Radiology Electronically Signed   By: Jacqulynn Cadet M.D.   On: 12/10/2017 11:41      Assessment/Plan 1. Peripheral vascular disease ([place]) Recommend:  The patient has evidence of severe atherosclerotic changes of both lower extremities with rest pain that is associated with preulcerative changes and impending tissue loss of the left foot.  This represents a limb threatening ischemia and places the patient at the risk for limb loss.  Patient should undergo angiography of the left lower extremity with the hope for intervention for limb salvage.  The risks and benefits as well as the alternative therapies was discussed in detail with the patient.  All questions were answered.  Patient agrees to proceed with angiography.  The patient will follow up with me in the office after the procedure.  2. Pain of left lower extremity Recommend:  The patient has evidence of severe atherosclerotic changes of both lower extremities with rest pain that is associated with preulcerative changes and impending tissue loss of the left foot.  This represents a limb threatening ischemia and places the patient at the risk for limb loss.  Patient should undergo angiography of the left lower extremity with the hope for intervention for limb salvage.  The risks and benefits as well as the alternative therapies was discussed in detail with the patient.  All questions were answered.  Patient agrees to proceed with angiography.  The patient will follow up with me in the office after the procedure.  3. Essential hypertension Continue antihypertensive medications as already ordered, these  medications have been reviewed and there are no changes at this time.   4. Hyperlipidemia, unspecified hyperlipidemia type Continue statin as ordered and reviewed, no changes at this time          Hortencia Pilar, MD  12/12/2017 10:20 AM

## 2017-12-13 ENCOUNTER — Encounter (INDEPENDENT_AMBULATORY_CARE_PROVIDER_SITE_OTHER): Payer: Self-pay | Admitting: Vascular Surgery

## 2017-12-16 ENCOUNTER — Other Ambulatory Visit (INDEPENDENT_AMBULATORY_CARE_PROVIDER_SITE_OTHER): Payer: Self-pay | Admitting: Vascular Surgery

## 2017-12-16 MED ORDER — CEFAZOLIN SODIUM-DEXTROSE 2-4 GM/100ML-% IV SOLN
2.0000 g | Freq: Once | INTRAVENOUS | Status: AC
  Administered 2017-12-17: 2 g via INTRAVENOUS

## 2017-12-17 ENCOUNTER — Ambulatory Visit
Admission: RE | Admit: 2017-12-17 | Discharge: 2017-12-17 | Disposition: A | Discharge status: Home / Self Care | Payer: Medicare Other | Source: Ambulatory Visit | Attending: Vascular Surgery | Admitting: Vascular Surgery

## 2017-12-17 ENCOUNTER — Encounter: Payer: Self-pay | Admitting: *Deleted

## 2017-12-17 ENCOUNTER — Encounter
Admission: RE | Disposition: A | Discharge status: Home / Self Care | Payer: Self-pay | Source: Ambulatory Visit | Attending: Vascular Surgery

## 2017-12-17 DIAGNOSIS — Z9842 Cataract extraction status, left eye: Secondary | ICD-10-CM | POA: Diagnosis not present

## 2017-12-17 DIAGNOSIS — I70212 Atherosclerosis of native arteries of extremities with intermittent claudication, left leg: Secondary | ICD-10-CM | POA: Diagnosis not present

## 2017-12-17 DIAGNOSIS — I10 Essential (primary) hypertension: Secondary | ICD-10-CM | POA: Diagnosis not present

## 2017-12-17 DIAGNOSIS — Z87440 Personal history of urinary (tract) infections: Secondary | ICD-10-CM | POA: Insufficient documentation

## 2017-12-17 DIAGNOSIS — Z8249 Family history of ischemic heart disease and other diseases of the circulatory system: Secondary | ICD-10-CM | POA: Diagnosis not present

## 2017-12-17 DIAGNOSIS — Z9889 Other specified postprocedural states: Secondary | ICD-10-CM | POA: Diagnosis not present

## 2017-12-17 DIAGNOSIS — E785 Hyperlipidemia, unspecified: Secondary | ICD-10-CM | POA: Diagnosis not present

## 2017-12-17 DIAGNOSIS — Z833 Family history of diabetes mellitus: Secondary | ICD-10-CM | POA: Insufficient documentation

## 2017-12-17 DIAGNOSIS — Z9049 Acquired absence of other specified parts of digestive tract: Secondary | ICD-10-CM | POA: Insufficient documentation

## 2017-12-17 DIAGNOSIS — R7303 Prediabetes: Secondary | ICD-10-CM | POA: Insufficient documentation

## 2017-12-17 DIAGNOSIS — Z90710 Acquired absence of both cervix and uterus: Secondary | ICD-10-CM | POA: Diagnosis not present

## 2017-12-17 DIAGNOSIS — M1990 Unspecified osteoarthritis, unspecified site: Secondary | ICD-10-CM | POA: Diagnosis not present

## 2017-12-17 DIAGNOSIS — I70218 Atherosclerosis of native arteries of extremities with intermittent claudication, other extremity: Secondary | ICD-10-CM | POA: Diagnosis not present

## 2017-12-17 DIAGNOSIS — Z8261 Family history of arthritis: Secondary | ICD-10-CM | POA: Diagnosis not present

## 2017-12-17 DIAGNOSIS — I739 Peripheral vascular disease, unspecified: Secondary | ICD-10-CM

## 2017-12-17 DIAGNOSIS — F1110 Opioid abuse, uncomplicated: Secondary | ICD-10-CM | POA: Diagnosis not present

## 2017-12-17 DIAGNOSIS — F17210 Nicotine dependence, cigarettes, uncomplicated: Secondary | ICD-10-CM | POA: Diagnosis not present

## 2017-12-17 DIAGNOSIS — Z8619 Personal history of other infectious and parasitic diseases: Secondary | ICD-10-CM | POA: Insufficient documentation

## 2017-12-17 HISTORY — PX: LOWER EXTREMITY ANGIOGRAPHY: CATH118251

## 2017-12-17 LAB — CREATININE, SERUM
Creatinine, Ser: 0.68 mg/dL (ref 0.44–1.00)
GFR calc Af Amer: >60 mL/min (ref >60)
GFR calc non Af Amer: >60 mL/min (ref >60)

## 2017-12-17 LAB — BUN: BUN: 19 mg/dL (ref 6–20)

## 2017-12-17 SURGERY — LOWER EXTREMITY ANGIOGRAPHY
Anesthesia: Moderate Sedation | Laterality: Left

## 2017-12-17 MED ORDER — HYDRALAZINE HCL 20 MG/ML IJ SOLN: 5.0000 mg | INTRAMUSCULAR | Status: DC | PRN

## 2017-12-17 MED ORDER — SODIUM CHLORIDE 0.9 % IV SOLN: INTRAVENOUS | Status: DC

## 2017-12-17 MED ORDER — CLOPIDOGREL BISULFATE 75 MG PO TABS: 75.0000 mg | ORAL_TABLET | Freq: Every day | ORAL | 5 refills | Status: DC

## 2017-12-17 MED ORDER — HEPARIN SODIUM (PORCINE) 1000 UNIT/ML IJ SOLN
INTRAMUSCULAR | Status: AC
  Filled 2017-12-17: qty 1

## 2017-12-17 MED ORDER — SODIUM CHLORIDE 0.9 % IV SOLN: 250.0000 mL | INTRAVENOUS | Status: DC | PRN

## 2017-12-17 MED ORDER — METHYLPREDNISOLONE SODIUM SUCC 125 MG IJ SOLR: 125.0000 mg | INTRAMUSCULAR | Status: DC | PRN

## 2017-12-17 MED ORDER — ONDANSETRON HCL 4 MG/2ML IJ SOLN: 4.0000 mg | Freq: Four times a day (QID) | INTRAMUSCULAR | Status: DC | PRN

## 2017-12-17 MED ORDER — CLOPIDOGREL BISULFATE 300 MG PO TABS
300.0000 mg | ORAL_TABLET | ORAL | Status: AC
  Administered 2017-12-17: 300 mg via ORAL

## 2017-12-17 MED ORDER — SODIUM CHLORIDE 0.9 % IV SOLN
INTRAVENOUS | Status: DC
  Administered 2017-12-17: 11:00:00 via INTRAVENOUS

## 2017-12-17 MED ORDER — SODIUM CHLORIDE 0.9% FLUSH: 3.0000 mL | Freq: Two times a day (BID) | INTRAVENOUS | Status: DC

## 2017-12-17 MED ORDER — HEPARIN SODIUM (PORCINE) 1000 UNIT/ML IJ SOLN
INTRAMUSCULAR | Status: DC | PRN
  Administered 2017-12-17: 4000 [IU] via INTRAVENOUS

## 2017-12-17 MED ORDER — MIDAZOLAM HCL 2 MG/2ML IJ SOLN
INTRAMUSCULAR | Status: DC | PRN
  Administered 2017-12-17: 1 mg via INTRAVENOUS
  Administered 2017-12-17: 2 mg via INTRAVENOUS
  Administered 2017-12-17 (×2): 1 mg via INTRAVENOUS

## 2017-12-17 MED ORDER — CLOPIDOGREL BISULFATE 300 MG PO TABS: 300.0000 mg | ORAL_TABLET | Freq: Once | ORAL | Status: DC

## 2017-12-17 MED ORDER — SODIUM CHLORIDE 0.9% FLUSH: 3.0000 mL | INTRAVENOUS | Status: DC | PRN

## 2017-12-17 MED ORDER — HEPARIN (PORCINE) IN NACL 1000-0.9 UT/500ML-% IV SOLN
INTRAVENOUS | Status: AC
  Filled 2017-12-17: qty 500

## 2017-12-17 MED ORDER — FENTANYL CITRATE (PF) 100 MCG/2ML IJ SOLN
INTRAMUSCULAR | Status: AC
  Filled 2017-12-17: qty 2

## 2017-12-17 MED ORDER — MIDAZOLAM HCL 5 MG/5ML IJ SOLN
INTRAMUSCULAR | Status: AC
  Filled 2017-12-17: qty 5

## 2017-12-17 MED ORDER — LABETALOL HCL 5 MG/ML IV SOLN: 10.0000 mg | INTRAVENOUS | Status: DC | PRN

## 2017-12-17 MED ORDER — HYDROMORPHONE HCL 1 MG/ML IJ SOLN: 1.0000 mg | Freq: Once | INTRAMUSCULAR | Status: DC | PRN

## 2017-12-17 MED ORDER — FAMOTIDINE 20 MG PO TABS: 40.0000 mg | ORAL_TABLET | ORAL | Status: DC | PRN

## 2017-12-17 MED ORDER — MORPHINE SULFATE (PF) 4 MG/ML IV SOLN: 2.0000 mg | INTRAVENOUS | Status: DC | PRN

## 2017-12-17 MED ORDER — OXYCODONE HCL 5 MG PO TABS: 5.0000 mg | ORAL_TABLET | ORAL | Status: DC | PRN

## 2017-12-17 MED ORDER — LIDOCAINE HCL (PF) 1 % IJ SOLN
INTRAMUSCULAR | Status: AC
  Filled 2017-12-17: qty 30

## 2017-12-17 MED ORDER — FENTANYL CITRATE (PF) 100 MCG/2ML IJ SOLN
INTRAMUSCULAR | Status: DC | PRN
  Administered 2017-12-17 (×4): 50 ug via INTRAVENOUS

## 2017-12-17 MED ORDER — ACETAMINOPHEN 325 MG PO TABS: 650.0000 mg | ORAL_TABLET | ORAL | Status: DC | PRN

## 2017-12-17 MED ORDER — CLOPIDOGREL BISULFATE 75 MG PO TABS
ORAL_TABLET | ORAL | Status: AC
  Administered 2017-12-17: 300 mg via ORAL
  Filled 2017-12-17: qty 4

## 2017-12-17 MED ORDER — IOPAMIDOL (ISOVUE-300) INJECTION 61%
INTRAVENOUS | Status: DC | PRN
  Administered 2017-12-17: 70 mL via INTRA_ARTERIAL

## 2017-12-17 SURGICAL SUPPLY — 24 items
BALLN LUTONIX 5X220X130 (BALLOONS) ×2
BALLN LUTONIX AV 8X40X75 (BALLOONS) ×2
BALLN LUTONIX DCB 7X40X130 (BALLOONS) ×4
BALLOON LUTONIX 5X220X130 (BALLOONS) IMPLANT
BALLOON LUTONIX AV 8X40X75 (BALLOONS) IMPLANT
BALLOON LUTONIX DCB 7X40X130 (BALLOONS) IMPLANT
CATH BEACON 5 .035 65 KMP TIP (CATHETERS) ×1 IMPLANT
CATH PIG 70CM (CATHETERS) ×2 IMPLANT
CATH VERT 5FR 125CM (CATHETERS) ×1 IMPLANT
DEVICE PRESTO INFLATION (MISCELLANEOUS) ×2 IMPLANT
DEVICE STARCLOSE SE CLOSURE (Vascular Products) ×1 IMPLANT
DEVICE TORQUE .025-.038 (MISCELLANEOUS) ×1 IMPLANT
GLIDECATH NONTAPER ANGL 5FR (CATHETERS) ×2 IMPLANT
GLIDEWIRE ADV .014X300CM (WIRE) ×2 IMPLANT
NDL ENTRY 21GA 7CM ECHOTIP (NEEDLE) IMPLANT
NEEDLE ENTRY 21GA 7CM ECHOTIP (NEEDLE) ×2 IMPLANT
PACK ANGIOGRAPHY (CUSTOM PROCEDURE TRAY) ×2 IMPLANT
SET INTRO CAPELLA COAXIAL (SET/KITS/TRAYS/PACK) ×1 IMPLANT
SHEATH BALKIN 6FR (SHEATH) ×1 IMPLANT
SHEATH BRITE TIP 5FRX11 (SHEATH) ×1 IMPLANT
TUBING CONTRAST HIGH PRESS 72 (TUBING) ×1 IMPLANT
WIRE AQUATRACK .035X260CM (WIRE) ×1 IMPLANT
WIRE J 3MM .035X145CM (WIRE) ×1 IMPLANT
WIRE MAGIC TORQUE 315CM (WIRE) ×1 IMPLANT

## 2017-12-17 NOTE — Discharge Instructions (Signed)

## 2017-12-17 NOTE — H&P (Signed)
[facility] History & Physical Update  The patient was interviewed and re-examined.  The patient's previous History and Physical has been reviewed and is unchanged.  There is no change in the plan of care. We plan to proceed with the scheduled procedure.  Hortencia Pilar, MD  12/17/2017, 1:20 PM

## 2017-12-17 NOTE — Op Note (Signed)
[facility] Percutaneous Study/Intervention Procedural Note   Date of Surgery: 12/17/2017  Surgeon:  Katha Cabal, MD.  Pre-operative Diagnosis: Atherosclerotic occlusive disease bilateral lower extremities with lifestyle limiting claudication left lower extremity  Post-operative diagnosis: Same  Procedure(s) Performed: 1. Introduction catheter into left lower extremity 3rd order catheter placement  2. Contrast injection left lower extremity for distal runoff   3. Percutaneous transluminal angioplasty left superficial femoral artery to 5 mm with a Lutonix drug-eluting balloon  4. Percutaneous transluminal angioplasty left external iliac artery to 8 mm with Lutonix drug-eluting balloon             5.  Star close closure right common femoral arteriotomy  Anesthesia: Conscious sedation was administered under my direct supervision by the interventional radiology RN. IV Versed plus fentanyl were utilized. Continuous ECG, pulse oximetry and blood pressure was monitored throughout the entire procedure.  Conscious sedation was for a total of 78 minutes.  Sheath: 6 [place] Balkan right common femoral  Contrast: 70 cc  Fluoroscopy Time: 7.3 minutes  Indications: Jeanell Sparrow presents with worsening left leg pain and lifestyle limiting claudication the risks and benefits are reviewed all questions answered patient agrees to proceed.  Procedure: Yesenia Shepard is a 67 y.o. y.o. female who was identified and appropriate procedural time out was performed. The patient was then placed supine on the table and prepped and draped in the usual sterile fashion.   Ultrasound was placed in the sterile sleeve and the right groin was evaluated the right common femoral artery was echolucent and pulsatile indicating patency.  Image was recorded for the permanent record and under real-time visualization a microneedle was inserted  into the common femoral artery microwire followed by a micro-sheath.  A J-wire was then advanced through the micro-sheath and a  5 [place] sheath was then inserted over a J-wire. J-wire was then advanced and a 5 French pigtail catheter was positioned at the level of T12. AP projection of the aorta was then obtained. Pigtail catheter was repositioned to above the bifurcation and a RAO view of the pelvis was obtained.  Subsequently a pigtail catheter with the stiff angle Glidewire was used to cross the aortic bifurcation the catheter wire were advanced down into the left distal external iliac artery. Oblique view of the femoral bifurcation was then obtained and subsequently the wire was reintroduced and the pigtail catheter negotiated into the SFA representing third order catheter placement. Distal runoff was then performed.  5000 units of heparin was then given and allowed to circulate and a 6 [place] Balkan sheath was advanced up and over the bifurcation and positioned in the femoral artery  KMP  catheter and stiff angle Glidewire were then negotiated down into the distal popliteal.  Distal runoff was then completed by hand injection through the catheter.  80% in-stent stenosis was noted in the previous iliac stent.  Diffuse disease throughout the midportion of the SFA was also identified with multiple strictures of 6070 and 80% identified.  Magnified RAO images of the left iliac bifurcation were then obtained.  Initially a 7 mm Lutonix but subsequently an 8 mm x 40 mm Lutonix drug-eluting balloon was utilized to treat the in-stent restenosis.  Inflation was to 12 atm for 1 minute.  Follow-up imaging demonstrated less than 5% residual stenosis.  High-grade stenosis at the origin of the left internal iliac is identified in the pre-intervention films and flow is maintained into the internal iliac post angioplasty  The detector is then repositioned a 5 mm x 220 mm Lutonix drug-eluting balloon was used to  angioplasty the superficial femoral and above-knee popliteal arteries. Inflation were to 12 atmospheres for 2 minutes. Follow-up imaging demonstrated patency with less than 10% residual stenosis. Distal runoff was then reassessed and found to be unchanged.  After review of these images the sheath is pulled into the right external iliac oblique of the common femoral is obtained and a Star close device deployed. There no immediate complications.   Findings: The abdominal aorta is opacified with a bolus injection contrast. Renal arteries are single and widely patent. The aorta itself has diffuse disease but no hemodynamically significant lesions. The common and external iliac arteries on the left demonstrated previously placed stent which begins in the distal common and extends into the external.  There is a greater than 80% in-stent restenosis in the midportion and a 70% stenosis at the distal edge of the stent.  Distally the left external iliac is widely patent.  The right common and external iliac arteries are widely patent.  The left common femoral is widely patent as is the profunda femoris.  The SFA does indeed have a significant stenosis as noted above with multiple tandem stenoses.  The distal popliteal demonstrates diffuse non-flow-limiting disease essentially is widely patent as is the trifurcation and there is two-vessel runoff to the foot via the posterior tibial and anterior tibial.  Following angioplasty left SFA is now patent with in-line flow and looks quite nice with less than 5% residual stenosis.  This is also true of the iliac lesion there is less than 5% residual stenosis following treatment    Summary: Successful recanalization left lower extremity for limb salvage    Disposition: Patient was taken to the recovery room in stable condition having tolerated the procedure well.  Schnier, Dolores Lory 12/17/2017,3:00 PM

## 2017-12-18 ENCOUNTER — Encounter: Payer: Self-pay | Admitting: Vascular Surgery

## 2017-12-19 ENCOUNTER — Other Ambulatory Visit (INDEPENDENT_AMBULATORY_CARE_PROVIDER_SITE_OTHER): Payer: Self-pay

## 2017-12-19 DIAGNOSIS — I739 Peripheral vascular disease, unspecified: Secondary | ICD-10-CM

## 2017-12-22 ENCOUNTER — Other Ambulatory Visit (INDEPENDENT_AMBULATORY_CARE_PROVIDER_SITE_OTHER): Payer: Self-pay | Admitting: Vascular Surgery

## 2017-12-23 ENCOUNTER — Other Ambulatory Visit (INDEPENDENT_AMBULATORY_CARE_PROVIDER_SITE_OTHER): Payer: Self-pay

## 2017-12-23 DIAGNOSIS — I739 Peripheral vascular disease, unspecified: Secondary | ICD-10-CM

## 2017-12-31 ENCOUNTER — Ambulatory Visit
Admission: RE | Admit: 2017-12-31 | Discharge: 2017-12-31 | Disposition: A | Discharge status: Home / Self Care | Payer: Medicare Other | Source: Ambulatory Visit | Attending: Vascular Surgery | Admitting: Vascular Surgery

## 2017-12-31 DIAGNOSIS — I739 Peripheral vascular disease, unspecified: Secondary | ICD-10-CM

## 2017-12-31 DIAGNOSIS — Z9889 Other specified postprocedural states: Secondary | ICD-10-CM | POA: Diagnosis not present

## 2018-01-02 ENCOUNTER — Ambulatory Visit (INDEPENDENT_AMBULATORY_CARE_PROVIDER_SITE_OTHER): Payer: Medicare Other | Admitting: Vascular Surgery

## 2018-01-02 ENCOUNTER — Encounter (INDEPENDENT_AMBULATORY_CARE_PROVIDER_SITE_OTHER): Payer: Self-pay | Admitting: Vascular Surgery

## 2018-01-02 VITALS — BP 116/60 | HR 58 | Resp 15 | Ht 65.0 in | Wt 163.0 lb

## 2018-01-02 DIAGNOSIS — M150 Primary generalized (osteo)arthritis: Secondary | ICD-10-CM

## 2018-01-02 DIAGNOSIS — E785 Hyperlipidemia, unspecified: Secondary | ICD-10-CM | POA: Diagnosis not present

## 2018-01-02 DIAGNOSIS — I70213 Atherosclerosis of native arteries of extremities with intermittent claudication, bilateral legs: Secondary | ICD-10-CM

## 2018-01-02 DIAGNOSIS — I10 Essential (primary) hypertension: Secondary | ICD-10-CM | POA: Diagnosis not present

## 2018-01-02 DIAGNOSIS — E119 Type 2 diabetes mellitus without complications: Secondary | ICD-10-CM | POA: Diagnosis not present

## 2018-01-02 DIAGNOSIS — M159 Polyosteoarthritis, unspecified: Secondary | ICD-10-CM

## 2018-01-02 DIAGNOSIS — M8949 Other hypertrophic osteoarthropathy, multiple sites: Secondary | ICD-10-CM

## 2018-01-02 NOTE — Progress Notes (Signed)
MRN : [id number]  Yesenia Shepard is a 67 y.o. (11/12/1950) female who presents with chief complaint of  Chief Complaint  Patient presents with  . Follow-up    Ultrasound f/u Arterial   .  History of Present Illness:   The patient returns to the office for followup and review status post angiogram with intervention on 12/17/2017.              1. Introduction catheter into left lower extremity 3rd order catheter placement  2. Contrast injection left lower extremity for distal runoff   3. Percutaneous transluminal angioplasty left superficial femoral artery to 5 mm with a Lutonix drug-eluting balloon  4. Percutaneous transluminal angioplasty left external iliac artery to 8 mm with Lutonix drug-eluting balloon             5.  Star close closure right common femoral arteriotomy  The patient notes minimal improvement in the lower extremity symptoms with little change in her claudication distance or rest pain symptoms. No new ulcers or wounds have occurred since the last visit.  She is having increased swelling of her left foot.  There have been no significant changes to the patient's overall health care.  The patient denies amaurosis fugax or recent TIA symptoms. There are no recent neurological changes noted. The patient denies history of DVT, PE or superficial thrombophlebitis. The patient denies recent episodes of angina or shortness of breath.   ABI's Rt=1.04 and Lt=0.96  (previous ABI's Rt=0.95 and Lt=0.78) Duplex US of the left lower extremity arterial system shows the femoral popliteal system is patent with monophasic signal noted at the distal tibial level     Current Meds  Medication Sig  . ARIPiprazole (ABILIFY) 10 MG tablet Take 10 mg by mouth daily.  Marland Kitchen aspirin 81 MG tablet Take 81 mg by mouth daily.  Marland Kitchen atorvastatin (LIPITOR) 40 MG tablet Take 1 tablet (40 mg total) by mouth daily.  . citalopram (CELEXA) 20 MG tablet Take 20 mg by  mouth daily.  . clonazePAM (KLONOPIN) 2 MG tablet Take 2 mg by mouth 4 (four) times daily.   . clopidogrel (PLAVIX) 75 MG tablet Take 1 tablet (75 mg total) by mouth daily.  . clopidogrel (PLAVIX) 75 MG tablet TAKE 1 TABLET BY MOUTH ONCE DAILY  . docusate sodium (COLACE) 100 MG capsule Take 200 mg by mouth 2 (two) times daily.   Marland Kitchen escitalopram (LEXAPRO) 20 MG tablet Take 20 mg by mouth daily.   . folic acid (FOLVITE) 1 MG tablet Take 2 mg by mouth 2 (two) times daily.  Marland Kitchen ibuprofen (ADVIL,MOTRIN) 200 MG tablet Take 600 mg by mouth daily as needed for mild pain or moderate pain.   Marland Kitchen lisinopril (PRINIVIL,ZESTRIL) 10 MG tablet Take 1 tablet (10 mg total) by mouth daily.  . methadone (DOLOPHINE) 10 MG tablet Take 110 mg by mouth daily.   . polyethylene glycol (MIRALAX / GLYCOLAX) packet Take 17 g by mouth daily as needed for mild constipation.  . traZODone (DESYREL) 100 MG tablet Take 200 mg by mouth at bedtime.     Past Medical History:  Diagnosis Date  . Anxiety   . Arthritis   . Chronic pain   . Depression   . Drug abuse ([place])    History of polysubstance abuse; currently on methadone.  . Hepatitis    History of Hep "C". treated and cured with Harvoni  . History of chicken pox   . Hypertension   . Panic  attacks   . Peripheral vascular disease ([place])    stent in place.   . Pre-diabetes   . UTI (urinary tract infection)    History of    Past Surgical History:  Procedure Laterality Date  . ABDOMINAL HYSTERECTOMY  1989  . CHOLECYSTECTOMY  1987  . INTRACAPSULAR CATARACT EXTRACTION Left   . LOWER EXTREMITY ANGIOGRAPHY Left 12/17/2017   Procedure: LOWER EXTREMITY ANGIOGRAPHY;  Surgeon: Katha Cabal, MD;  Location: [facility];  Service: Cardiovascular;  Laterality: Left;  . PERIPHERAL VASCULAR CATHETERIZATION Left 05/04/2015   Procedure: Lower Extremity Angiography;  Surgeon: Katha Cabal, MD;  Location: [facility];  Service: Cardiovascular;  Laterality:  Left;  . PERIPHERAL VASCULAR CATHETERIZATION Left 05/04/2015   Procedure: Lower Extremity Intervention;  Surgeon: Katha Cabal, MD;  Location: [facility];  Service: Cardiovascular;  Laterality: Left;  . SHOULDER ARTHROSCOPY WITH OPEN ROTATOR CUFF REPAIR Right 01/16/2017   Procedure: SHOULDER ARTHROSCOPY WITH OPEN ROTATOR CUFF REPAIR;  Surgeon: Thornton Park, MD;  Location: [facility];  Service: Orthopedics;  Laterality: Right;  . TONSILLECTOMY AND ADENOIDECTOMY  1971    Social History Social History   Tobacco Use  . Smoking status: Current Every Day Smoker    Packs/day: 0.50    Years: 30.00    Pack years: 15.00    Types: Cigarettes  . Smokeless tobacco: Never Used  . Tobacco comment: Currently smoking 1/2 ppd  Substance Use Topics  . Alcohol use: No    Alcohol/week: 0.0 oz  . Drug use: No    Comment: Hx of.    Family History Family History  Problem Relation Age of Onset  . Arthritis Mother   . Mental illness Mother        depression  . Diabetes Mother   . Cancer Mother        pancreatic  . Cancer Father        colon  . Heart disease Father   . Diabetes Father   . Cancer Daughter        lung    No Known Allergies   REVIEW OF SYSTEMS (Negative unless checked)  Constitutional: [] Weight loss  [] Fever  [] Chills Cardiac: [] Chest pain   [] Chest pressure   [] Palpitations   [] Shortness of breath when laying flat   [] Shortness of breath with exertion. Vascular:  [x] Pain in legs with walking   [x] Pain in legs at rest  [] History of DVT   [] Phlebitis   [x] Swelling in legs   [] Varicose veins   [] Non-healing ulcers Pulmonary:   [] Uses home oxygen   [] Productive cough   [] Hemoptysis   [] Wheeze  [] COPD   [] Asthma Neurologic:  [] Dizziness   [] Seizures   [] History of stroke   [] History of TIA  [] Aphasia   [] Vissual changes   [] Weakness or numbness in arm   [] Weakness or numbness in leg Musculoskeletal:   [] Joint swelling   [x] Joint pain   [x] Low back  pain Hematologic:  [] Easy bruising  [] Easy bleeding   [] Hypercoagulable state   [] Anemic Gastrointestinal:  [] Diarrhea   [] Vomiting  [] Gastroesophageal reflux/heartburn   [] Difficulty swallowing. Genitourinary:  [] Chronic kidney disease   [] Difficult urination  [] Frequent urination   [] Blood in urine Skin:  [] Rashes   [] Ulcers  Psychological:  [] History of anxiety   []  History of major depression.  Physical Examination  Vitals:   01/02/18 1508  BP: 116/60  Pulse: (!) 58  Resp: 15  Weight: 163 lb (73.9 kg)  Height: 5\' 5"  (1.651 m)   Body mass index is 27.12 kg/m. Gen: WD/WN, NAD Head: [place]/AT, No temporalis wasting.  Ear/Nose/Throat: Hearing grossly intact, nares w/o erythema or drainage Eyes: PER, EOMI, sclera nonicteric.  Neck: Supple, no large masses.   Pulmonary:  Good air movement, no audible wheezing bilaterally, no use of accessory muscles.  Cardiac: RRR, no JVD Vascular: scattered varicosities present bilaterally.  Mild venous stasis changes to the legs bilaterally.  3+ soft pitting edema left leg trace edeam right leg Vessel Right Left  Radial Palpable Palpable  PT Not Palpable Not Palpable  DP Not Palpable Not Palpable  Gastrointestinal: Non-distended. No guarding/no peritoneal signs.  Musculoskeletal: M/S 5/5 throughout.  No deformity or atrophy.  Neurologic: CN 2-12 intact. Symmetrical.  Speech is fluent. Motor exam as listed above. Psychiatric: Judgment intact, Mood & affect appropriate for pt's clinical situation. Dermatologic: mild venous rashes no ulcers noted.  No changes consistent with cellulitis. Lymph : No lichenification or skin changes of chronic lymphedema.  CBC Lab Results  Component Value Date   WBC 6.1 01/10/2017   HGB 12.9 01/10/2017   HCT 38.3 01/10/2017   MCV 92.0 01/10/2017   PLT 196 01/10/2017    BMET    Component Value Date/Time   NA 139 01/10/2017 1033   NA 139 02/04/2015 1305   K 4.1 01/10/2017 1033   CL 102 01/10/2017 1033   CO2  31 01/10/2017 1033   GLUCOSE 109 (H) 01/10/2017 1033   BUN 19 12/17/2017 1024   BUN 10 02/04/2015 1305   CREATININE 0.68 12/17/2017 1024   CREATININE 0.76 01/05/2016 1524   CALCIUM 8.9 01/10/2017 1033   GFRNONAA >60 12/17/2017 1024   GFRNONAA 83 01/05/2016 1524   GFRAA >60 12/17/2017 1024   GFRAA >89 01/05/2016 1524   Estimated Creatinine Clearance: 68.7 mL/min (by C-G formula based on SCr of 0.68 mg/dL).  COAG Lab Results  Component Value Date   INR 1.06 01/10/2017   INR 1.17 10/18/2015    Radiology US Arterial Seg Multiple  Result Date: 12/10/2017 CLINICAL DATA:  67 year old female with bilateral lower extremity pain and edema EXAM: NONINVASIVE PHYSIOLOGIC VASCULAR STUDY OF BILATERAL LOWER EXTREMITIES TECHNIQUE: Non-invasive vascular evaluation of both lower extremities was performed at rest, including calculation of ankle-brachial indices, multiple segmental pressure evaluation, segmental Doppler and segmental pulse volume recording. COMPARISON:  None. FINDINGS: Right Lower Extremity Resting ABI:  0.95 Segmental Pressures: Significant pressure gradient between the thigh and calf cuffs suggesting femoropopliteal disease. Additionally, there is a significant gradient between the calf and ankle suggesting runoff disease. Arterial Waveforms: Biphasic arterial waveforms at the ankle. PVRs: Blunted PVRs at the ankle. Left Lower Extremity: Resting ABI: 0.78 Segmental Pressures: Significant pressure gradient between the thigh and calf cuffs suggesting femoropopliteal disease. Arterial Waveforms: Biphasic arterial waveforms at the ankle. PVRs: Blunted PVRs at the ankle. Other: Symmetric upper extremity pressures. Ankle Brachial index > 1.4 Non diagnostic secondary to incompressible vessel calcifications 1.0-1.4       Normal 0.9-0.99     Borderline PAD 0.8-0.89     Mild PAD 0.5-0.79     Moderate PAD < 0.5          Severe PAD Toe Brachial Index Normal     >0.65 Moderate  0.53-0.64 Severe     <0.23 Toe  Pressures Absolute toe pressure >58mmHg sufficient for wound healing. Toe pressures <73mmHg = critical limb ischemia. IMPRESSION: 1. Borderline abnormal resting right ankle-brachial index of 0.95. Segmental evaluation suggests femoropopliteal disease.  2. Abnormal resting left ankle-brachial index of 0.78 consistent with at least moderate peripheral arterial disease. Segmental evaluation suggests a combination of femoropopliteal and below the knee disease. Signed, Criselda Peaches, MD [facility] Radiology Electronically Signed   By: Jacqulynn Cadet M.D.   On: 12/10/2017 11:41   US Arterial Abi (screening Lower Extremity)  Result Date: 01/01/2018 CLINICAL DATA:  67 year old female with a history of known vascular disease. Cardiovascular risk factors include smoking, hypertension, hyperlipidemia, known vascular disease. Treatment performed 12/17/2017 EXAM: NONINVASIVE PHYSIOLOGIC VASCULAR STUDY OF BILATERAL LOWER EXTREMITIES TECHNIQUE: Evaluation of both lower extremities was performed at rest, including calculation of ankle-brachial indices, multiple segmental pressure evaluation, segmental Doppler and segmental pulse volume recording. Directed duplex performed COMPARISON:  12/10/2017 FINDINGS: Right ABI:  1.04 Left ABI:  0.96 Right Lower Extremity: Segmental Doppler at the right ankle demonstrates unchanged waveforms, biphasic. Directed duplex of the right lower extremity demonstrates triphasic common femoral artery, profunda femoris, superficial femoral artery, popliteal artery. Biphasic tibial arteries. Left Lower Extremity: Segmental Doppler at the left ankle demonstrates maintained waveforms at the ankle. Directed duplex of the left lower extremity demonstrates biphasic common femoral artery, profunda femoris. Distal SFA in the popliteal artery are monophasic,. Monophasic tibial arteries. IMPRESSION: Right: Resting ABI on the right lower extremity within  normal limits. Segmental exam and directed duplex demonstrates developing tibial disease. Left: Resting ABI improved from the comparison, now in the normal range status post surgery on December 17, 2017. Transition from triphasic waveform in the proximal femoropopliteal segment to monophasic in the distal femoropopliteal segment and tibial vessels, with the amplitude of the waveform maintained at the ankles. Signed, Dulcy Fanny. Dellia Nims, RPVI [facility] Radiology Electronically Signed   By: Corrie Mckusick D.O.   On: 01/01/2018 08:47   [place] Lower Ext Art Bilat  Result Date: 01/01/2018 CLINICAL DATA:  68 year old female with a history of known vascular disease. Cardiovascular risk factors include smoking, hypertension, hyperlipidemia, known vascular disease. Treatment performed 12/17/2017 EXAM: NONINVASIVE PHYSIOLOGIC VASCULAR STUDY OF BILATERAL LOWER EXTREMITIES TECHNIQUE: Evaluation of both lower extremities was performed at rest, including calculation of ankle-brachial indices, multiple segmental pressure evaluation, segmental Doppler and segmental pulse volume recording. Directed duplex performed COMPARISON:  12/10/2017 FINDINGS: Right ABI:  1.04 Left ABI:  0.96 Right Lower Extremity: Segmental Doppler at the right ankle demonstrates unchanged waveforms, biphasic. Directed duplex of the right lower extremity demonstrates triphasic common femoral artery, profunda femoris, superficial femoral artery, popliteal artery. Biphasic tibial arteries. Left Lower Extremity: Segmental Doppler at the left ankle demonstrates maintained waveforms at the ankle. Directed duplex of the left lower extremity demonstrates biphasic common femoral artery, profunda femoris. Distal SFA in the popliteal artery are monophasic,. Monophasic tibial arteries. IMPRESSION: Right: Resting ABI on the right lower extremity within normal limits. Segmental exam and directed duplex demonstrates developing  tibial disease. Left: Resting ABI improved from the comparison, now in the normal range status post surgery on December 17, 2017. Transition from triphasic waveform in the proximal femoropopliteal segment to monophasic in the distal femoropopliteal segment and tibial vessels, with the amplitude of the waveform maintained at the ankles. Signed, Dulcy Fanny. Dellia Nims, RPVI [facility] Radiology Electronically Signed   By: Corrie Mckusick D.O.   On: 01/01/2018 08:47     Assessment/Plan 1. Atherosclerosis of native artery of both lower extremities with intermittent claudication (HCC) Recommend:  The patient is status post successful angiogram with intervention.  The patient reports that the claudication symptoms and leg pain is essentially gone.   The patient denies lifestyle limiting changes at this point in time.  No further invasive studies, angiography or surgery at this time The patient should continue walking and begin a more formal exercise program.  The patient should continue antiplatelet therapy and aggressive treatment of the lipid abnormalities  The patient should begin wearing graduated compression socks 15-20 mmHg strength to control the edema.  Patient should undergo noninvasive studies as ordered. The patient will follow up with me after the studies.   - VAS [place] ABI WITH/WO TBI; Future - VAS [place] LOWER EXTREMITY ARTERIAL DUPLEX; Future  2. Essential hypertension Continue antihypertensive medications as already ordered, these medications have been reviewed and there are no changes at this time.   3. Type 2 diabetes mellitus without complication, without long-term current use of insulin (HCC) Continue hypoglycemic medications as already ordered, these medications have been reviewed and there are no changes at this time.  Hgb A1C to be monitored as already arranged by primary service   4. Primary osteoarthritis involving multiple  joints Continue NSAID medications as already ordered, these medications have been reviewed and there are no changes at this time.  Continued activity and therapy was stressed.   5. Hyperlipidemia, unspecified hyperlipidemia type Continue statin as ordered and reviewed, no changes at this time     Hortencia Pilar, MD  01/02/2018 3:38 PM

## 2018-01-03 ENCOUNTER — Encounter (INDEPENDENT_AMBULATORY_CARE_PROVIDER_SITE_OTHER): Payer: Self-pay | Admitting: Vascular Surgery

## 2018-01-03 DIAGNOSIS — I70219 Atherosclerosis of native arteries of extremities with intermittent claudication, unspecified extremity: Secondary | ICD-10-CM | POA: Insufficient documentation

## 2018-01-27 ENCOUNTER — Telehealth (INDEPENDENT_AMBULATORY_CARE_PROVIDER_SITE_OTHER): Payer: Self-pay | Admitting: Vascular Surgery

## 2018-01-27 NOTE — Telephone Encounter (Signed)
Patient left a VM saying that she had angioplasty surgery 3 weeks ago. Said her leg is hurting and swollen. She can be reached at [phone].

## 2018-01-27 NOTE — Telephone Encounter (Signed)
I spoke with patient and she also stated she has been loosing her balance and has fallen since her procedure.I spoke with Schnier he advise for the patient to speak with her PCP.I did speak with Schnier about the patient left leg pain and swelling and he advise for the patient to have a abi and see provider afterwards.The patient will be coming in 01/30/18 for her appointment

## 2018-01-30 ENCOUNTER — Encounter (INDEPENDENT_AMBULATORY_CARE_PROVIDER_SITE_OTHER): Payer: Medicare Other

## 2018-01-30 ENCOUNTER — Ambulatory Visit (INDEPENDENT_AMBULATORY_CARE_PROVIDER_SITE_OTHER): Payer: Medicare Other | Admitting: Vascular Surgery

## 2018-01-30 ENCOUNTER — Other Ambulatory Visit (INDEPENDENT_AMBULATORY_CARE_PROVIDER_SITE_OTHER): Payer: Self-pay | Admitting: Vascular Surgery

## 2018-01-30 DIAGNOSIS — M79606 Pain in leg, unspecified: Secondary | ICD-10-CM

## 2018-01-30 DIAGNOSIS — Z9862 Peripheral vascular angioplasty status: Secondary | ICD-10-CM

## 2018-02-05 ENCOUNTER — Telehealth: Payer: Self-pay | Admitting: *Deleted

## 2018-02-05 ENCOUNTER — Other Ambulatory Visit: Payer: Self-pay | Admitting: Internal Medicine

## 2018-02-05 DIAGNOSIS — I10 Essential (primary) hypertension: Secondary | ICD-10-CM

## 2018-02-05 DIAGNOSIS — E785 Hyperlipidemia, unspecified: Secondary | ICD-10-CM

## 2018-02-05 MED ORDER — ATORVASTATIN CALCIUM 40 MG PO TABS: 40.0000 mg | ORAL_TABLET | Freq: Every day | ORAL | 0 refills | Status: DC

## 2018-02-05 MED ORDER — LISINOPRIL 10 MG PO TABS: 10.0000 mg | ORAL_TABLET | Freq: Every day | ORAL | 0 refills | Status: DC

## 2018-02-05 NOTE — Telephone Encounter (Signed)
Copied from [place] [phone]. Topic: General - Other >> Feb 04, 2018  5:53 PM Mcneil, Ja-Kwan wrote: Reason for CRM: Pt appt scheduled for 04/10/18 at 3 pm. Pt states she is almost out of both the atorvastatin (LIPITOR) 40 MG tablet and lisinopril (PRINIVIL,ZESTRIL) 10 MG tablet. Pt request call back. Cb# [phone]

## 2018-02-05 NOTE — Telephone Encounter (Signed)
Please keep next appt to cont refills otherwise will be denied I have never seen this pt   [place]

## 2018-02-05 NOTE — Telephone Encounter (Signed)
No lipid panel since 3/18 ok to fill til appointment?

## 2018-02-06 ENCOUNTER — Encounter: Payer: Self-pay | Admitting: *Deleted

## 2018-02-06 NOTE — Telephone Encounter (Signed)
Patient aware she must keep appointment.

## 2018-02-18 ENCOUNTER — Telehealth: Payer: Self-pay | Admitting: Internal Medicine

## 2018-02-18 NOTE — Telephone Encounter (Signed)
Pt called back stating that she no longer needs this to be sent in. Pt had already had it filled and forgot.

## 2018-02-18 NOTE — Telephone Encounter (Signed)
Copied from [place] [id number]. Topic: Quick Communication - Rx Refill/Question >> Feb 18, 2018  4:05 PM Selinda Flavin B, [place] wrote: Medication: lisinopril (PRINIVIL,ZESTRIL) 10 MG tablet  Has the patient contacted their pharmacy? Yes.   (Agent: If no, request that the patient contact the pharmacy for the refill.) (Agent: If yes, when and what did the pharmacy advise?)  Preferred Pharmacy (with phone number or street name): [place], [place]: Please be advised that RX refills may take up to 3 business days. We ask that you follow-up with your pharmacy.

## 2018-02-27 ENCOUNTER — Ambulatory Visit (INDEPENDENT_AMBULATORY_CARE_PROVIDER_SITE_OTHER): Payer: Medicare Other | Admitting: Vascular Surgery

## 2018-02-27 ENCOUNTER — Ambulatory Visit (INDEPENDENT_AMBULATORY_CARE_PROVIDER_SITE_OTHER): Payer: Medicare Other

## 2018-02-27 ENCOUNTER — Encounter (INDEPENDENT_AMBULATORY_CARE_PROVIDER_SITE_OTHER): Payer: Self-pay | Admitting: Vascular Surgery

## 2018-02-27 VITALS — BP 126/61 | HR 63 | Resp 16 | Ht 65.0 in | Wt 169.8 lb

## 2018-02-27 DIAGNOSIS — M7989 Other specified soft tissue disorders: Secondary | ICD-10-CM

## 2018-02-27 DIAGNOSIS — I70213 Atherosclerosis of native arteries of extremities with intermittent claudication, bilateral legs: Secondary | ICD-10-CM

## 2018-02-27 DIAGNOSIS — E785 Hyperlipidemia, unspecified: Secondary | ICD-10-CM

## 2018-02-27 DIAGNOSIS — M79605 Pain in left leg: Secondary | ICD-10-CM

## 2018-02-27 DIAGNOSIS — M159 Polyosteoarthritis, unspecified: Secondary | ICD-10-CM

## 2018-02-27 DIAGNOSIS — M150 Primary generalized (osteo)arthritis: Secondary | ICD-10-CM | POA: Diagnosis not present

## 2018-02-27 DIAGNOSIS — M8949 Other hypertrophic osteoarthropathy, multiple sites: Secondary | ICD-10-CM

## 2018-02-27 DIAGNOSIS — E119 Type 2 diabetes mellitus without complications: Secondary | ICD-10-CM

## 2018-02-27 DIAGNOSIS — I739 Peripheral vascular disease, unspecified: Secondary | ICD-10-CM

## 2018-02-27 MED ORDER — HYDROCODONE-ACETAMINOPHEN 5-325 MG PO TABS: 1.0000 | ORAL_TABLET | Freq: Four times a day (QID) | ORAL | 0 refills | Status: DC | PRN

## 2018-02-28 ENCOUNTER — Other Ambulatory Visit (INDEPENDENT_AMBULATORY_CARE_PROVIDER_SITE_OTHER): Payer: Self-pay | Admitting: Vascular Surgery

## 2018-02-28 ENCOUNTER — Encounter (INDEPENDENT_AMBULATORY_CARE_PROVIDER_SITE_OTHER): Payer: Self-pay | Admitting: Nurse Practitioner

## 2018-02-28 ENCOUNTER — Ambulatory Visit (INDEPENDENT_AMBULATORY_CARE_PROVIDER_SITE_OTHER): Payer: Medicare Other | Admitting: Nurse Practitioner

## 2018-02-28 ENCOUNTER — Ambulatory Visit (INDEPENDENT_AMBULATORY_CARE_PROVIDER_SITE_OTHER): Payer: Medicare Other

## 2018-02-28 VITALS — BP 137/69 | HR 56 | Resp 16 | Ht 65.0 in | Wt 169.0 lb

## 2018-02-28 DIAGNOSIS — I872 Venous insufficiency (chronic) (peripheral): Secondary | ICD-10-CM

## 2018-02-28 DIAGNOSIS — R609 Edema, unspecified: Secondary | ICD-10-CM

## 2018-02-28 DIAGNOSIS — I739 Peripheral vascular disease, unspecified: Secondary | ICD-10-CM

## 2018-02-28 DIAGNOSIS — M79605 Pain in left leg: Secondary | ICD-10-CM

## 2018-02-28 NOTE — Progress Notes (Signed)
Subjective:    Patient ID: Yesenia Shepard, female    DOB: Mar 02, 1951, 67 y.o.   MRN: [id number] Chief Complaint  Patient presents with  . Follow-up    le venous duplex ultrasound    HPI  Yesenia Shepard is a 67 y.o. female who presents with pain and swelling in the left lower extremity. Symptom onset has been constant for a time period of 3-4 month(s). Severity is described as moderate to severe. Course of her symptoms over time is constant.  Ms. Zartman underwent a left lower extremity angiogram on 12/17/2017.  She underwent an angioplasty of the left SFA as well as a left external iliac artery.  She underwent an ABI on 02/27/2018 which was not concerning for recurrence of her PAD.  Today she underwent a lower extremity venous reflux study, which revealed reflux in the common femoral vein with no evidence of DVT in either extremity.   Constitutional: [] Weight loss  [] Fever  [] Chills Cardiac: [] Chest pain   [] Chest pressure   [] Palpitations   [] Shortness of breath when laying flat   [] Shortness of breath with exertion. Vascular:  [x] Pain in legs with walking   [x] Pain in legs with standing  [] History of DVT   [] Phlebitis   [x] Swelling in legs   [] Varicose veins   [] Non-healing ulcers Pulmonary:   [] Uses home oxygen   [] Productive cough   [] Hemoptysis   [] Wheeze  [] COPD   [] Asthma Neurologic:  [] Dizziness   [] Seizures   [] History of stroke   [] History of TIA  [] Aphasia   [] Vissual changes   [] Weakness or numbness in arm   [] Weakness or numbness in leg Musculoskeletal:   [] Joint swelling   [] Joint pain   [] Low back pain Hematologic:  [] Easy bruising  [] Easy bleeding   [] Hypercoagulable state   [] Anemic Gastrointestinal:  [] Diarrhea   [] Vomiting  [] Gastroesophageal reflux/heartburn   [] Difficulty swallowing. Genitourinary:  [] Chronic kidney disease   [] Difficult urination  [] Frequent urination   [] Blood in urine Skin:  [] Rashes   [] Ulcers  Psychological:  [] History of anxiety   []  History of major  depression.     Objective:   Physical Exam  BP 137/69 (BP Location: Right Arm)   Pulse (!) 56   Resp 16   Ht 5\' 5"  (1.651 m)   Wt 169 lb (76.7 kg)   BMI 28.12 kg/m   Past Medical History:  Diagnosis Date  . Anxiety   . Arthritis   . Chronic pain   . Depression   . Drug abuse ([place])    History of polysubstance abuse; currently on methadone.  . Hepatitis    History of Hep "C". treated and cured with Harvoni  . History of chicken pox   . Hypertension   . Panic attacks   . Peripheral vascular disease ([place])    stent in place.   . Pre-diabetes   . UTI (urinary tract infection)    History of     Gen: WD/WN, NAD Head: [place]/AT, No temporalis wasting.  Ear/Nose/Throat: Hearing grossly intact, nares w/o erythema or drainage Eyes: PER, EOMI, sclera nonicteric.  Neck: Supple, no masses.  No JVD.  Pulmonary:  Good air movement, no use of accessory muscles.  Cardiac: RRR Vascular:  2+ pitting edema to the left leg Vessel Right Left  DP  not palpable  not palpable  Gastrointestinal: soft, non-distended. No guarding/no peritoneal signs.  Musculoskeletal:   No deformity or atrophy. Stiffness and pain with bending or walking Neurologic: Pain and  light touch intact in extremities.  Symmetrical.  Speech is fluent. Motor exam as listed above. Psychiatric: Judgment intact, Mood & affect appropriate for pt's clinical situation. Dermatologic: No Venous rashes. No Ulcers Noted.  No changes consistent with cellulitis. Lymph : No Cervical lymphadenopathy, no lichenification or skin changes of chronic lymphedema.   Social History   Socioeconomic History  . Marital status: Divorced    Spouse name: Not on file  . Number of children: Not on file  . Years of education: Not on file  . Highest education level: Not on file  Occupational History  . Not on file  Social Needs  . Financial resource strain: Not hard at all  . Food insecurity:    Worry: Never true    Inability: Never true  .  Transportation needs:    Medical: No    Non-medical: No  Tobacco Use  . Smoking status: Current Every Day Smoker    Packs/day: 0.50    Years: 30.00    Pack years: 15.00    Types: Cigarettes  . Smokeless tobacco: Never Used  . Tobacco comment: Currently smoking 1/2 ppd  Substance and Sexual Activity  . Alcohol use: No    Alcohol/week: 0.0 standard drinks  . Drug use: No    Comment: Hx of.  . Sexual activity: Not Currently  Lifestyle  . Physical activity:    Days per week: 0 days    Minutes per session: Not on file  . Stress: Not on file  Relationships  . Social connections:    Talks on phone: Not on file    Gets together: Not on file    Attends religious service: Not on file    Active member of club or organization: Not on file    Attends meetings of clubs or organizations: Not on file    Relationship status: Not on file  . Intimate partner violence:    Fear of current or ex partner: No    Emotionally abused: No    Physically abused: No    Forced sexual activity: No  Other Topics Concern  . Not on file  Social History Narrative  . Not on file    Past Surgical History:  Procedure Laterality Date  . ABDOMINAL HYSTERECTOMY  1989  . CHOLECYSTECTOMY  1987  . INTRACAPSULAR CATARACT EXTRACTION Left   . LOWER EXTREMITY ANGIOGRAPHY Left 12/17/2017   Procedure: LOWER EXTREMITY ANGIOGRAPHY;  Surgeon: Katha Cabal, MD;  Location: [facility];  Service: Cardiovascular;  Laterality: Left;  . PERIPHERAL VASCULAR CATHETERIZATION Left 05/04/2015   Procedure: Lower Extremity Angiography;  Surgeon: Katha Cabal, MD;  Location: [place] [facility];  Service: Cardiovascular;  Laterality: Left;  . PERIPHERAL VASCULAR CATHETERIZATION Left 05/04/2015   Procedure: Lower Extremity Intervention;  Surgeon: Katha Cabal, MD;  Location: [facility];  Service: Cardiovascular;  Laterality: Left;  . SHOULDER ARTHROSCOPY WITH OPEN ROTATOR CUFF REPAIR Right 01/16/2017    Procedure: SHOULDER ARTHROSCOPY WITH OPEN ROTATOR CUFF REPAIR;  Surgeon: Thornton Park, MD;  Location: [facility];  Service: Orthopedics;  Laterality: Right;  . TONSILLECTOMY AND ADENOIDECTOMY  1971    Family History  Problem Relation Age of Onset  . Arthritis Mother   . Mental illness Mother        depression  . Diabetes Mother   . Cancer Mother        pancreatic  . Cancer Father        colon  . Heart  disease Father   . Diabetes Father   . Cancer Daughter        lung    No Known Allergies     Assessment & Plan:   1. Pain of left lower extremity I suggested the patient that the pain could be related to back issues.  I suggested that she contact her primary care physician for plain x-rays.  The pain that she is describing is consistent with back pain, considering her recent vascular studies.  2. Peripheral vascular disease ([place]) We will repeat an ABI within 1 month, just to ensure that there is no change considering the extent of the patient's pain. - VAS [place] ABI WITH/WO TBI; Future  3. Chronic venous insufficiency The patient has chronic venous insufficiency in her left lower extremity.  The patient was advised to utilize medical grade 1 compression stockings.  The patient was also given a prescription for medical grade 1 compression stockings.  The patient was also taught to elevate her extremity when not mobile.  She was advised to use NSAIDs for pain control and to mobilize as much as tolerable.  I suggested and interrupted the patient today to help control her swelling, however she decided that she wants to think about it and did not wish to proceed today.  Current Outpatient Medications on File Prior to Visit  Medication Sig Dispense Refill  . ARIPiprazole (ABILIFY) 10 MG tablet Take 10 mg by mouth daily.    Marland Kitchen aspirin 81 MG tablet Take 81 mg by mouth daily.    Marland Kitchen atorvastatin (LIPITOR) 40 MG tablet Take 1 tablet (40 mg total) by mouth daily. 90 tablet 0  . citalopram  (CELEXA) 20 MG tablet Take 20 mg by mouth daily.    . clonazePAM (KLONOPIN) 2 MG tablet Take 2 mg by mouth 4 (four) times daily.     . clopidogrel (PLAVIX) 75 MG tablet Take 1 tablet (75 mg total) by mouth daily. 30 tablet 5  . clopidogrel (PLAVIX) 75 MG tablet TAKE 1 TABLET BY MOUTH ONCE DAILY 30 tablet 11  . docusate sodium (COLACE) 100 MG capsule Take 200 mg by mouth 2 (two) times daily.     Marland Kitchen escitalopram (LEXAPRO) 20 MG tablet Take 20 mg by mouth daily.     . folic acid (FOLVITE) 1 MG tablet Take 2 mg by mouth 2 (two) times daily.    Marland Kitchen HYDROcodone-acetaminophen (NORCO) 5-325 MG tablet Take 1-2 tablets by mouth every 6 (six) hours as needed for moderate pain or severe pain. 40 tablet 0  . ibuprofen (ADVIL,MOTRIN) 200 MG tablet Take 600 mg by mouth daily as needed for mild pain or moderate pain.     Marland Kitchen lisinopril (PRINIVIL,ZESTRIL) 10 MG tablet Take 1 tablet (10 mg total) by mouth daily. 90 tablet 0  . methadone (DOLOPHINE) 10 MG tablet Take 110 mg by mouth daily.     . polyethylene glycol (MIRALAX / GLYCOLAX) packet Take 17 g by mouth daily as needed for mild constipation.    . traZODone (DESYREL) 100 MG tablet Take 200 mg by mouth at bedtime.      No current facility-administered medications on file prior to visit.     There are no Patient Instructions on file for this visit. Return in about 1 month (around 03/31/2018).   Kris Hartmann, NP

## 2018-03-01 ENCOUNTER — Encounter (INDEPENDENT_AMBULATORY_CARE_PROVIDER_SITE_OTHER): Payer: Self-pay | Admitting: Vascular Surgery

## 2018-03-01 DIAGNOSIS — M7989 Other specified soft tissue disorders: Secondary | ICD-10-CM | POA: Insufficient documentation

## 2018-03-01 NOTE — Progress Notes (Signed)
MRN : [id number]  Yesenia Shepard is a 67 y.o. (1951/07/01) female who presents with chief complaint of  Chief Complaint  Patient presents with  . Follow-up    abi ultrasound  .  History of Present Illness: The patient returns to the office for followup and review of the noninvasive studies. There has been a significant deterioration in the lower extremity symptoms.  The patient notes interval shortening of their claudication distance and development of mild rest pain symptoms. No new ulcers or wounds have occurred since the last visit.  She now describes the pain radiating down the leg and from "front to back".  It began abruptly about one week ago.  It is constant.  She rates It 10/10. Activity doesn't make it better or worse.  There have been no significant changes to the patient's overall health care.  The patient denies amaurosis fugax or recent TIA symptoms. There are no recent neurological changes noted. The patient denies history of DVT, PE or superficial thrombophlebitis. The patient denies recent episodes of angina or shortness of breath.   ABI's Rt=0.99 and Lt=1.04 (previous ABI's Rt=0.95 and Lt=0.78)  Current Meds  Medication Sig  . ARIPiprazole (ABILIFY) 10 MG tablet Take 10 mg by mouth daily.  Marland Kitchen aspirin 81 MG tablet Take 81 mg by mouth daily.  Marland Kitchen atorvastatin (LIPITOR) 40 MG tablet Take 1 tablet (40 mg total) by mouth daily.  . citalopram (CELEXA) 20 MG tablet Take 20 mg by mouth daily.  . clonazePAM (KLONOPIN) 2 MG tablet Take 2 mg by mouth 4 (four) times daily.   . clopidogrel (PLAVIX) 75 MG tablet Take 1 tablet (75 mg total) by mouth daily.  . clopidogrel (PLAVIX) 75 MG tablet TAKE 1 TABLET BY MOUTH ONCE DAILY  . docusate sodium (COLACE) 100 MG capsule Take 200 mg by mouth 2 (two) times daily.   Marland Kitchen escitalopram (LEXAPRO) 20 MG tablet Take 20 mg by mouth daily.   . folic acid (FOLVITE) 1 MG tablet Take 2 mg by mouth 2 (two) times daily.  Marland Kitchen ibuprofen (ADVIL,MOTRIN) 200  MG tablet Take 600 mg by mouth daily as needed for mild pain or moderate pain.   Marland Kitchen lisinopril (PRINIVIL,ZESTRIL) 10 MG tablet Take 1 tablet (10 mg total) by mouth daily.  . methadone (DOLOPHINE) 10 MG tablet Take 110 mg by mouth daily.   . polyethylene glycol (MIRALAX / GLYCOLAX) packet Take 17 g by mouth daily as needed for mild constipation.  . traZODone (DESYREL) 100 MG tablet Take 200 mg by mouth at bedtime.     Past Medical History:  Diagnosis Date  . Anxiety   . Arthritis   . Chronic pain   . Depression   . Drug abuse ([place])    History of polysubstance abuse; currently on methadone.  . Hepatitis    History of Hep "C". treated and cured with Harvoni  . History of chicken pox   . Hypertension   . Panic attacks   . Peripheral vascular disease ([place])    stent in place.   . Pre-diabetes   . UTI (urinary tract infection)    History of    Past Surgical History:  Procedure Laterality Date  . ABDOMINAL HYSTERECTOMY  1989  . CHOLECYSTECTOMY  1987  . INTRACAPSULAR CATARACT EXTRACTION Left   . LOWER EXTREMITY ANGIOGRAPHY Left 12/17/2017   Procedure: LOWER EXTREMITY ANGIOGRAPHY;  Surgeon: Katha Cabal, MD;  Location: [place] [facility];  Service: Cardiovascular;  Laterality: Left;  .  PERIPHERAL VASCULAR CATHETERIZATION Left 05/04/2015   Procedure: Lower Extremity Angiography;  Surgeon: Katha Cabal, MD;  Location: [place] [facility];  Service: Cardiovascular;  Laterality: Left;  . PERIPHERAL VASCULAR CATHETERIZATION Left 05/04/2015   Procedure: Lower Extremity Intervention;  Surgeon: Katha Cabal, MD;  Location: [place] [facility];  Service: Cardiovascular;  Laterality: Left;  . SHOULDER ARTHROSCOPY WITH OPEN ROTATOR CUFF REPAIR Right 01/16/2017   Procedure: SHOULDER ARTHROSCOPY WITH OPEN ROTATOR CUFF REPAIR;  Surgeon: Thornton Park, MD;  Location: [facility];  Service: Orthopedics;  Laterality: Right;  . TONSILLECTOMY AND ADENOIDECTOMY  1971    Social  History Social History   Tobacco Use  . Smoking status: Current Every Day Smoker    Packs/day: 0.50    Years: 30.00    Pack years: 15.00    Types: Cigarettes  . Smokeless tobacco: Never Used  . Tobacco comment: Currently smoking 1/2 ppd  Substance Use Topics  . Alcohol use: No    Alcohol/week: 0.0 standard drinks  . Drug use: No    Comment: Hx of.    Family History Family History  Problem Relation Age of Onset  . Arthritis Mother   . Mental illness Mother        depression  . Diabetes Mother   . Cancer Mother        pancreatic  . Cancer Father        colon  . Heart disease Father   . Diabetes Father   . Cancer Daughter        lung    No Known Allergies   REVIEW OF SYSTEMS (Negative unless checked)  Constitutional: [] Weight loss  [] Fever  [] Chills Cardiac: [] Chest pain   [] Chest pressure   [] Palpitations   [] Shortness of breath when laying flat   [] Shortness of breath with exertion. Vascular:  [x] Pain in legs with walking   [x] Pain in legs at rest  [] History of DVT   [] Phlebitis   [x] Swelling in legs   [] Varicose veins   [] Non-healing ulcers Pulmonary:   [] Uses home oxygen   [] Productive cough   [] Hemoptysis   [] Wheeze  [] COPD   [] Asthma Neurologic:  [] Dizziness   [] Seizures   [] History of stroke   [] History of TIA  [] Aphasia   [] Vissual changes   [] Weakness or numbness in arm   [] Weakness or numbness in leg Musculoskeletal:   [] Joint swelling   [x] Joint pain   [x] Low back pain Hematologic:  [] Easy bruising  [] Easy bleeding   [] Hypercoagulable state   [] Anemic Gastrointestinal:  [] Diarrhea   [] Vomiting  [] Gastroesophageal reflux/heartburn   [] Difficulty swallowing. Genitourinary:  [] Chronic kidney disease   [] Difficult urination  [] Frequent urination   [] Blood in urine Skin:  [] Rashes   [] Ulcers  Psychological:  [] History of anxiety   []  History of major depression.  Physical Examination  Vitals:   02/27/18 1518  BP: 126/61  Pulse: 63  Resp: 16  Weight: 169  lb 12.8 oz (77 kg)  Height: 5\' 5"  (1.651 m)   Body mass index is 28.26 kg/m. Gen: WD/WN, NAD Head: [place]/AT, No temporalis wasting.  Ear/Nose/Throat: Hearing grossly intact, nares w/o erythema or drainage Eyes: PER, EOMI, sclera nonicteric.  Neck: Supple, no large masses.   Pulmonary:  Good air movement, no audible wheezing bilaterally, no use of accessory muscles.  Cardiac: RRR, no JVD Vascular: scattered varicosities present bilaterally.  Mild venous stasis changes to the legs bilaterally.  3-4+ soft pitting edema Vessel Right Left  Radial Palpable Palpable  PT Not Palpable Not Palpable  DP Not Palpable Not Palpable  Gastrointestinal: Non-distended. No guarding/no peritoneal signs.  Musculoskeletal: M/S 5/5 throughout.  + deformity with joint arthritic changes no atrophy.  Neurologic: CN 2-12 intact. Symmetrical.  Speech is fluent. Motor exam as listed above. Psychiatric: Judgment intact, Mood & affect appropriate for pt's clinical situation. Dermatologic: No rashes or ulcers noted.  No changes consistent with cellulitis. Lymph : No lichenification or skin changes of chronic lymphedema.  CBC Lab Results  Component Value Date   WBC 6.1 01/10/2017   HGB 12.9 01/10/2017   HCT 38.3 01/10/2017   MCV 92.0 01/10/2017   PLT 196 01/10/2017    BMET    Component Value Date/Time   NA 139 01/10/2017 1033   NA 139 02/04/2015 1305   K 4.1 01/10/2017 1033   CL 102 01/10/2017 1033   CO2 31 01/10/2017 1033   GLUCOSE 109 (H) 01/10/2017 1033   BUN 19 12/17/2017 1024   BUN 10 02/04/2015 1305   CREATININE 0.68 12/17/2017 1024   CREATININE 0.76 01/05/2016 1524   CALCIUM 8.9 01/10/2017 1033   GFRNONAA >60 12/17/2017 1024   GFRNONAA 83 01/05/2016 1524   GFRAA >60 12/17/2017 1024   GFRAA >89 01/05/2016 1524   CrCl cannot be calculated (Patient's most recent lab result is older than the maximum 21 days allowed.).  COAG Lab Results  Component Value Date   INR 1.06 01/10/2017   INR 1.17  10/18/2015    Radiology No results found.    Assessment/Plan 1. Pain of left lower extremity Recommend:  Patient should undergo venous duplex of the lower extremity ASAP because there has been a significant deterioration in the patient's lower extremity symptoms.  The patient states they are having increased pain and a marked decrease in the distance that they can walk.  There has been a marked increase in swelling.  ABI's remain stable with triphasic arterial signals suggesting arteria disease is stable  The risks and benefits as well as the alternatives were discussed in detail with the patient.  All questions were answered.  Patient agrees to proceed and understands this could be a prelude to angiography and intervention.  The patient will follow up with me in the office to review the studies.    A total of 30 minutes was spent with this patient and greater than 50% was spent in counseling and coordination of care with the patient.  Discussion included the treatment options for vascular disease including indications for surgery and intervention.  Also discussed is the appropriate timing of treatment.  In addition medical therapy was discussed.  2. Swelling of limb See #1  3. Peripheral vascular disease ([place]) See #1  4. Primary osteoarthritis involving multiple joints I am suspicious her LS spine disease is worsening but will r/o DVT first.  5. Type 2 diabetes mellitus without complication, without long-term current use of insulin (HCC) Continue hypoglycemic medications as already ordered, these medications have been reviewed and there are no changes at this time.  Hgb A1C to be monitored as already arranged by primary service   6. Hyperlipidemia, unspecified hyperlipidemia type Continue statin as ordered and reviewed, no changes at this time     Hortencia Pilar, MD  03/01/2018 12:48 PM

## 2018-03-31 ENCOUNTER — Encounter (INDEPENDENT_AMBULATORY_CARE_PROVIDER_SITE_OTHER): Payer: Medicare Other

## 2018-03-31 ENCOUNTER — Ambulatory Visit (INDEPENDENT_AMBULATORY_CARE_PROVIDER_SITE_OTHER): Payer: Medicare Other | Admitting: Vascular Surgery

## 2018-04-01 ENCOUNTER — Telehealth (INDEPENDENT_AMBULATORY_CARE_PROVIDER_SITE_OTHER): Payer: Self-pay | Admitting: Vascular Surgery

## 2018-04-02 NOTE — Telephone Encounter (Signed)
I would recommend Dr. Deri Fuelling or Dr. Arnoldo Morale in [place] there is also Dr. Elayne Guerin with [facility] here at [facility] and he does do office here one day a week.  However, most insurance plans require the primary care to be the referral person just let her know thanks

## 2018-04-02 NOTE — Telephone Encounter (Signed)
Called the patient to let her know that the referral should come from her primary care doctor, and she states that she will speak to him/her at her upcoming office visit on April 10, 2018. I did give her the recommended back doctors names.

## 2018-04-10 ENCOUNTER — Encounter: Payer: Self-pay | Admitting: Internal Medicine

## 2018-04-10 ENCOUNTER — Ambulatory Visit (INDEPENDENT_AMBULATORY_CARE_PROVIDER_SITE_OTHER): Payer: Medicare Other | Admitting: Internal Medicine

## 2018-04-10 ENCOUNTER — Encounter

## 2018-04-10 VITALS — BP 130/70 | HR 84 | Temp 99.0°F | Ht 65.0 in | Wt 176.0 lb

## 2018-04-10 DIAGNOSIS — E559 Vitamin D deficiency, unspecified: Secondary | ICD-10-CM | POA: Diagnosis not present

## 2018-04-10 DIAGNOSIS — R7303 Prediabetes: Secondary | ICD-10-CM

## 2018-04-10 DIAGNOSIS — R600 Localized edema: Secondary | ICD-10-CM

## 2018-04-10 DIAGNOSIS — E785 Hyperlipidemia, unspecified: Secondary | ICD-10-CM | POA: Diagnosis not present

## 2018-04-10 DIAGNOSIS — R269 Unspecified abnormalities of gait and mobility: Secondary | ICD-10-CM | POA: Diagnosis not present

## 2018-04-10 DIAGNOSIS — R197 Diarrhea, unspecified: Secondary | ICD-10-CM | POA: Diagnosis not present

## 2018-04-10 DIAGNOSIS — I739 Peripheral vascular disease, unspecified: Secondary | ICD-10-CM | POA: Diagnosis not present

## 2018-04-10 DIAGNOSIS — I10 Essential (primary) hypertension: Secondary | ICD-10-CM | POA: Diagnosis not present

## 2018-04-10 LAB — COMPREHENSIVE METABOLIC PANEL
ALT: 9 U/L (ref 0–35)
AST: 15 U/L (ref 0–37)
Albumin: 3.5 g/dL (ref 3.5–5.2)
Alkaline Phosphatase: 82 U/L (ref 39–117)
BUN: 11 mg/dL (ref 6–23)
CO2: 35 mEq/L — ABNORMAL HIGH (ref 19–32)
Calcium: 9 mg/dL (ref 8.4–10.5)
Chloride: 101 mEq/L (ref 96–112)
Creatinine, Ser: 0.78 mg/dL (ref 0.40–1.20)
GFR: 78.11 mL/min (ref >60.00)
Glucose, Bld: 226 mg/dL — ABNORMAL HIGH (ref 70–99)
Potassium: 4.3 mEq/L (ref 3.5–5.1)
Sodium: 138 mEq/L (ref 135–145)
Total Bilirubin: 0.3 mg/dL (ref 0.2–1.2)
Total Protein: 6.7 g/dL (ref 6.0–8.3)

## 2018-04-10 LAB — CBC WITH DIFFERENTIAL/PLATELET
Basophils Absolute: 0.1 10*3/uL (ref 0.0–0.1)
Basophils Relative: 0.8 % (ref 0.0–3.0)
Eosinophils Absolute: 0.2 10*3/uL (ref 0.0–0.7)
Eosinophils Relative: 2.3 % (ref 0.0–5.0)
HCT: 37.1 % (ref 36.0–46.0)
Hemoglobin: 12.2 g/dL (ref 12.0–15.0)
Lymphocytes Relative: 26 % (ref 12.0–46.0)
Lymphs Abs: 1.7 10*3/uL (ref 0.7–4.0)
MCHC: 33 g/dL (ref 30.0–36.0)
MCV: 90.9 fl (ref 78.0–100.0)
Monocytes Absolute: 0.3 10*3/uL (ref 0.1–1.0)
Monocytes Relative: 5 % (ref 3.0–12.0)
Neutro Abs: 4.3 10*3/uL (ref 1.4–7.7)
Neutrophils Relative %: 65.9 % (ref 43.0–77.0)
Platelets: 311 10*3/uL (ref 150.0–400.0)
RBC: 4.08 Mil/uL (ref 3.87–5.11)
RDW: 13.4 % (ref 11.5–15.5)
WBC: 6.6 10*3/uL (ref 4.0–10.5)

## 2018-04-10 LAB — VITAMIN D 25 HYDROXY (VIT D DEFICIENCY, FRACTURES): VITD: 28.74 ng/mL — ABNORMAL LOW (ref 30.00–100.00)

## 2018-04-10 LAB — HEMOGLOBIN A1C: Hgb A1c MFr Bld: 6.3 % (ref 4.6–6.5)

## 2018-04-10 LAB — TSH: TSH: 0.86 u[IU]/mL (ref 0.35–4.50)

## 2018-04-10 MED ORDER — LISINOPRIL 10 MG PO TABS: 10.0000 mg | ORAL_TABLET | Freq: Every day | ORAL | 3 refills | Status: DC

## 2018-04-10 MED ORDER — ATORVASTATIN CALCIUM 40 MG PO TABS: 40.0000 mg | ORAL_TABLET | Freq: Every day | ORAL | 3 refills | Status: AC

## 2018-04-10 MED ORDER — FUROSEMIDE 20 MG PO TABS: 20.0000 mg | ORAL_TABLET | Freq: Every day | ORAL | 2 refills | Status: DC

## 2018-04-10 NOTE — Patient Instructions (Addendum)
Try low sugar gatorade  Try peptobismol tablets for diarrhea  Drop stool and urine at [place] A bland diet consists of foods that do not have a lot of fat or fiber. Foods without fat or fiber are easier for the body to digest. They are also less likely to irritate your mouth, throat, stomach, and other parts of your gastrointestinal tract. A bland diet is sometimes called a BRAT diet. What is my plan? Your health care provider or dietitian may recommend specific changes to your diet to prevent and treat your symptoms, such as:  Eating small meals often.  Cooking food until it is soft enough to chew easily.  Chewing your food well.  Drinking fluids slowly.  Not eating foods that are very spicy, sour, or fatty.  Not eating citrus fruits, such as oranges and grapefruit.  What do I need to know about this diet?  Eat a variety of foods from the bland diet food list.  Do not follow a bland diet longer than you have to.  Ask your health care provider whether you should take vitamins. What foods can I eat? Grains  Hot cereals, such as cream of wheat. Bread, crackers, or tortillas made from refined white flour. Rice. Vegetables Canned or cooked vegetables. Mashed or boiled potatoes. Fruits Bananas. Applesauce. Other types of cooked or canned fruit with the skin and seeds removed, such as canned peaches or pears. Meats and Other Protein Sources Scrambled eggs. Creamy peanut butter or other nut butters. Lean, well-cooked meats, such as chicken or fish. Tofu. Soups or broths. Dairy Low-fat dairy products, such as milk, cottage cheese, or yogurt. Beverages Water. Herbal tea. Apple juice. Sweets and Desserts Pudding. Custard. Fruit gelatin. Ice cream. Fats and Oils Mild salad dressings. Canola or olive oil. The items listed above may not be a complete list of allowed foods or beverages. Contact your dietitian for more options. What foods are not  recommended? Foods and ingredients that are often not recommended include:  Spicy foods, such as hot sauce or salsa.  Fried foods.  Sour foods, such as pickled or fermented foods.  Raw vegetables or fruits, especially citrus or berries.  Caffeinated drinks.  Alcohol.  Strongly flavored seasonings or condiments.  The items listed above may not be a complete list of foods and beverages that are not allowed. Contact your dietitian for more information. This information is not intended to replace advice given to you by your health care provider. Make sure you discuss any questions you have with your health care provider. Document Released: 10/17/2015 Document Revised: 12/01/2015 Document Reviewed: 07/07/2014 Elsevier Interactive Patient Education  2018 [employer]. Diarrhea, Adult Diarrhea is frequent loose and watery bowel movements. Diarrhea can make you feel weak and cause you to become dehydrated. Dehydration can make you tired and thirsty, cause you to have a dry mouth, and decrease how often you urinate. Diarrhea typically lasts 2-3 days. However, it can last longer if it is a sign of something more serious. It is important to treat your diarrhea as told by your health care provider. Follow these instructions at home: Eating and drinking  Follow these recommendations as told by your health care provider:  Take an oral rehydration solution (ORS). This is a drink that is sold at pharmacies and retail stores.  Drink clear fluids, such as water, ice chips, diluted fruit juice, and low-calorie sports drinks.  Eat bland, easy-to-digest foods in small amounts as you are able.  These foods include bananas, applesauce, rice, lean meats, toast, and crackers.  Avoid drinking fluids that contain a lot of sugar or caffeine, such as energy drinks, sports drinks, and soda.  Avoid alcohol.  Avoid spicy or fatty foods.  General instructions  Drink enough fluid to keep your urine clear or  pale yellow.  Wash your hands often. If soap and water are not available, use hand sanitizer.  Make sure that all people in your household wash their hands well and often.  Take over-the-counter and prescription medicines only as told by your health care provider.  Rest at home while you recover.  Watch your condition for any changes.  Take a warm bath to relieve any burning or pain from frequent diarrhea episodes.  Keep all follow-up visits as told by your health care provider. This is important. Contact a health care provider if:  You have a fever.  Your diarrhea gets worse.  You have new symptoms.  You cannot keep fluids down.  You feel light-headed or dizzy.  You have a headache  You have muscle cramps. Get help right away if:  You have chest pain.  You feel extremely weak or you faint.  You have bloody or black stools or stools that look like tar.  You have severe pain, cramping, or bloating in your abdomen.  You have trouble breathing or you are breathing very quickly.  Your heart is beating very quickly.  Your skin feels cold and clammy.  You feel confused.  You have signs of dehydration, such as: ? Dark urine, very little urine, or no urine. ? Cracked lips. ? Dry mouth. ? Sunken eyes. ? Sleepiness. ? Weakness. This information is not intended to replace advice given to you by your health care provider. Make sure you discuss any questions you have with your health care provider. Document Released: 06/15/2002 Document Revised: 11/03/2015 Document Reviewed: 03/01/2015 Elsevier Interactive Patient Education  [employer].

## 2018-04-10 NOTE — Progress Notes (Signed)
Pre visit review using our clinic review tool, if applicable. No additional management support is needed unless otherwise documented below in the visit note. 

## 2018-04-10 NOTE — Progress Notes (Signed)
Chief Complaint  Patient presents with  . Acute Visit  . Diarrhea  . Abdominal Pain   Acute visit  1. C/o diarrhea 4-5 x per day watery diarrhea no sick contacts, no Abx, no new foods x 2-3 days and feeling weak with fever and chills, no ab pain no nausea/vomiting nothing tried other than bland diet  2 .HTN blood pressure controlled on lis 10 mg qd h/o PAD/HLD on lipitor 40 mg qd  3. C/o leg edema s/p sent for PAD x 2 with Dr. Delana Meyer [place] 02/28/18 negative for DVT  4. C/o abnormal gait has walker at home but does not feel safe and does not have with her today    Review of Systems  Constitutional: Positive for chills and fever. Negative for weight loss.  HENT: Negative for hearing loss.   Eyes: Negative for blurred vision.  Respiratory: Negative for shortness of breath.   Cardiovascular: Positive for leg swelling.  Gastrointestinal: Positive for diarrhea. Negative for nausea and vomiting.  Musculoskeletal: Negative for falls.  Skin: Negative for rash.  Neurological: Negative for headaches.  Psychiatric/Behavioral: Negative for depression.   Past Medical History:  Diagnosis Date  . Anxiety   . Arthritis   . Chronic pain   . Depression   . Drug abuse ([place])    History of polysubstance abuse; currently on methadone.  . Hepatitis    History of Hep "C". treated and cured with Harvoni  . History of chicken pox   . Hypertension   . Panic attacks   . Peripheral vascular disease ([place])    stent in place.   . Pre-diabetes   . UTI (urinary tract infection)    History of   Past Surgical History:  Procedure Laterality Date  . ABDOMINAL HYSTERECTOMY  1989  . CHOLECYSTECTOMY  1987  . INTRACAPSULAR CATARACT EXTRACTION Left   . LOWER EXTREMITY ANGIOGRAPHY Left 12/17/2017   Procedure: LOWER EXTREMITY ANGIOGRAPHY;  Surgeon: Katha Cabal, MD;  Location: [place] [facility];  Service: Cardiovascular;  Laterality: Left;  . PERIPHERAL VASCULAR CATHETERIZATION Left 05/04/2015    Procedure: Lower Extremity Angiography;  Surgeon: Katha Cabal, MD;  Location: [place] [facility];  Service: Cardiovascular;  Laterality: Left;  . PERIPHERAL VASCULAR CATHETERIZATION Left 05/04/2015   Procedure: Lower Extremity Intervention;  Surgeon: Katha Cabal, MD;  Location: [place] [facility];  Service: Cardiovascular;  Laterality: Left;  . SHOULDER ARTHROSCOPY WITH OPEN ROTATOR CUFF REPAIR Right 01/16/2017   Procedure: SHOULDER ARTHROSCOPY WITH OPEN ROTATOR CUFF REPAIR;  Surgeon: Thornton Park, MD;  Location: [facility];  Service: Orthopedics;  Laterality: Right;  . TONSILLECTOMY AND ADENOIDECTOMY  1971   Family History  Problem Relation Age of Onset  . Arthritis Mother   . Mental illness Mother        depression  . Diabetes Mother   . Cancer Mother        pancreatic  . Cancer Father        colon  . Heart disease Father   . Diabetes Father   . Cancer Daughter        lung   Social History   Socioeconomic History  . Marital status: Divorced    Spouse name: Not on file  . Number of children: Not on file  . Years of education: Not on file  . Highest education level: Not on file  Occupational History  . Not on file  Social Needs  . Financial resource strain: Not hard at all  .  Food insecurity:    Worry: Never true    Inability: Never true  . Transportation needs:    Medical: No    Non-medical: No  Tobacco Use  . Smoking status: Current Every Day Smoker    Packs/day: 0.50    Years: 30.00    Pack years: 15.00    Types: Cigarettes  . Smokeless tobacco: Never Used  . Tobacco comment: Currently smoking 1/2 ppd  Substance and Sexual Activity  . Alcohol use: No    Alcohol/week: 0.0 standard drinks  . Drug use: No    Comment: Hx of.  . Sexual activity: Not Currently  Lifestyle  . Physical activity:    Days per week: 0 days    Minutes per session: Not on file  . Stress: Not on file  Relationships  . Social connections:    Talks on phone: Not on file     Gets together: Not on file    Attends religious service: Not on file    Active member of club or organization: Not on file    Attends meetings of clubs or organizations: Not on file    Relationship status: Not on file  . Intimate partner violence:    Fear of current or ex partner: No    Emotionally abused: No    Physically abused: No    Forced sexual activity: No  Other Topics Concern  . Not on file  Social History Narrative  . Not on file   Current Meds  Medication Sig  . ARIPiprazole (ABILIFY) 10 MG tablet Take 10 mg by mouth daily.  Marland Kitchen aspirin 81 MG tablet Take 81 mg by mouth daily.  Marland Kitchen atorvastatin (LIPITOR) 40 MG tablet Take 1 tablet (40 mg total) by mouth daily.  . citalopram (CELEXA) 20 MG tablet Take 20 mg by mouth daily.  . clonazePAM (KLONOPIN) 2 MG tablet Take 2 mg by mouth 4 (four) times daily.   . clopidogrel (PLAVIX) 75 MG tablet Take 1 tablet (75 mg total) by mouth daily.  . clopidogrel (PLAVIX) 75 MG tablet TAKE 1 TABLET BY MOUTH ONCE DAILY  . docusate sodium (COLACE) 100 MG capsule Take 200 mg by mouth 2 (two) times daily.   Marland Kitchen escitalopram (LEXAPRO) 20 MG tablet Take 20 mg by mouth daily.   . folic acid (FOLVITE) 1 MG tablet Take 2 mg by mouth 2 (two) times daily.  Marland Kitchen HYDROcodone-acetaminophen (NORCO) 5-325 MG tablet Take 1-2 tablets by mouth every 6 (six) hours as needed for moderate pain or severe pain.  Marland Kitchen ibuprofen (ADVIL,MOTRIN) 200 MG tablet Take 600 mg by mouth daily as needed for mild pain or moderate pain.   Marland Kitchen lisinopril (PRINIVIL,ZESTRIL) 10 MG tablet Take 1 tablet (10 mg total) by mouth daily.  . methadone (DOLOPHINE) 10 MG tablet Take 110 mg by mouth daily.   . polyethylene glycol (MIRALAX / GLYCOLAX) packet Take 17 g by mouth daily as needed for mild constipation.  . traZODone (DESYREL) 100 MG tablet Take 200 mg by mouth at bedtime.    No Known Allergies No results found for this or any previous visit (from the past 2160 hour(s)). Objective  Body  mass index is 29.29 kg/m. Wt Readings from Last 3 Encounters:  04/10/18 176 lb (79.8 kg)  02/28/18 169 lb (76.7 kg)  02/27/18 169 lb 12.8 oz (77 kg)   Temp Readings from Last 3 Encounters:  04/10/18 99 F (37.2 C) (Oral)  12/17/17 98.4 F (36.9 C) (Oral)  09/20/17 98.3  F (36.8 C) (Oral)   BP Readings from Last 3 Encounters:  04/10/18 130/70  02/28/18 137/69  02/27/18 126/61   Pulse Readings from Last 3 Encounters:  04/10/18 84  02/28/18 (!) 56  02/27/18 63    Physical Exam  Constitutional: She is oriented to person, place, and time. Vital signs are normal. She appears well-developed and well-nourished. She is cooperative.  HENT:  Head: Normocephalic and atraumatic.  Mouth/Throat: Oropharynx is clear and moist and mucous membranes are normal.  Eyes: Pupils are equal, round, and reactive to light.  Cardiovascular: Normal rate, regular rhythm and normal heart sounds.  2+ leg edema b/l   Abdominal: Soft. Normal appearance. There is no tenderness.  Neurological: She is alert and oriented to person, place, and time.  Limping gait   Skin: Skin is warm, dry and intact.  Psychiatric: She has a normal mood and affect. Her speech is normal and behavior is normal. Judgment and thought content normal. Cognition and memory are normal.  Nursing note and vitals reviewed.   Assessment   1. Diarrhea ? Etiology  2. HTN  3. PAD/HLD 4. Leg edema  5. Abnormal gait  6. HM Plan   1. Labs today and GI path panel and C diff  otc peptobismol pill  Brat and low sugar gatorade  2. Refilled meds  3. Refilled meds  Check lipid fasting at f/u  4. Lasix 20 mg qd  5. Given Rx rolling walker with seat and brakes today 6.  Flu shot disc at f/u  Tdap, shingrix disc at f/u  Due pna 23  Declines MMR check   Will need mammo Consider pap if not had  Colonoscopy never had disc at f/u  Dexa disc at f/u  Derm disc at f/u   Provider: Dr. Olivia Mackie [name]-Internal Medicine

## 2018-04-23 ENCOUNTER — Other Ambulatory Visit: Payer: Self-pay | Admitting: Internal Medicine

## 2018-04-23 ENCOUNTER — Ambulatory Visit (INDEPENDENT_AMBULATORY_CARE_PROVIDER_SITE_OTHER): Payer: Medicare Other | Admitting: Internal Medicine

## 2018-04-23 ENCOUNTER — Ambulatory Visit (INDEPENDENT_AMBULATORY_CARE_PROVIDER_SITE_OTHER): Payer: Medicare Other

## 2018-04-23 ENCOUNTER — Encounter: Payer: Self-pay | Admitting: Internal Medicine

## 2018-04-23 VITALS — BP 132/76 | HR 70 | Temp 98.5°F | Ht 65.0 in | Wt 183.4 lb

## 2018-04-23 DIAGNOSIS — M5441 Lumbago with sciatica, right side: Secondary | ICD-10-CM

## 2018-04-23 DIAGNOSIS — R600 Localized edema: Secondary | ICD-10-CM

## 2018-04-23 DIAGNOSIS — G8929 Other chronic pain: Secondary | ICD-10-CM

## 2018-04-23 DIAGNOSIS — M545 Low back pain, unspecified: Secondary | ICD-10-CM

## 2018-04-23 DIAGNOSIS — M25511 Pain in right shoulder: Secondary | ICD-10-CM

## 2018-04-23 DIAGNOSIS — R197 Diarrhea, unspecified: Secondary | ICD-10-CM | POA: Diagnosis not present

## 2018-04-23 DIAGNOSIS — M5386 Other specified dorsopathies, lumbar region: Secondary | ICD-10-CM | POA: Diagnosis not present

## 2018-04-23 DIAGNOSIS — M5442 Lumbago with sciatica, left side: Secondary | ICD-10-CM | POA: Diagnosis not present

## 2018-04-23 DIAGNOSIS — I10 Essential (primary) hypertension: Secondary | ICD-10-CM | POA: Diagnosis not present

## 2018-04-23 MED ORDER — LISINOPRIL 10 MG PO TABS: 10.0000 mg | ORAL_TABLET | Freq: Every day | ORAL | 3 refills | Status: DC

## 2018-04-23 MED ORDER — MELOXICAM 15 MG PO TABS: 15.0000 mg | ORAL_TABLET | Freq: Every day | ORAL | 2 refills | Status: DC | PRN

## 2018-04-23 NOTE — Patient Instructions (Signed)

## 2018-04-23 NOTE — Progress Notes (Signed)
Chief Complaint  Patient presents with  . Back Pain    denies any urinary s/s. possible injury   F/u  1. Chronic leg swelling s/p 2 stents with f/u with Dr. Ronalee Belts [place] 02/2018 neg DVT 2. Low back pain with falls, no saddle anesthesia, b/l feet numb pain 10/10 at times worse with standing and movement today 2/10 tried Ibuprofen, tylenol does not help back worse x 6 months  3. Diarrhea resolved  4. Right shoulder pain s/p rotator cuff surgery then tore rotator cuff in PT [facility] Dr. Raliegh Ip  Review of Systems  Constitutional: Negative for weight loss.  HENT: Negative for hearing loss.   Eyes: Negative for blurred vision.  Respiratory: Negative for shortness of breath.   Cardiovascular: Negative for chest pain.  Gastrointestinal: Negative for abdominal pain.  Musculoskeletal: Positive for back pain and falls.  Skin: Negative for rash.  Neurological: Negative for headaches.  Psychiatric/Behavioral: Negative for depression.   Past Medical History:  Diagnosis Date  . Anxiety   . Arthritis   . Chronic pain   . Depression   . Drug abuse ([place])    History of polysubstance abuse; currently on methadone.  . Hepatitis    History of Hep "C". treated and cured with Harvoni  . History of chicken pox   . Hypertension   . Panic attacks   . Peripheral vascular disease ([place])    stent in place.   . Pre-diabetes   . UTI (urinary tract infection)    History of   Past Surgical History:  Procedure Laterality Date  . ABDOMINAL HYSTERECTOMY  1989  . CHOLECYSTECTOMY  1987  . INTRACAPSULAR CATARACT EXTRACTION Left   . LOWER EXTREMITY ANGIOGRAPHY Left 12/17/2017   Procedure: LOWER EXTREMITY ANGIOGRAPHY;  Surgeon: Katha Cabal, MD;  Location: [place] [facility];  Service: Cardiovascular;  Laterality: Left;  . PERIPHERAL VASCULAR CATHETERIZATION Left 05/04/2015   Procedure: Lower Extremity Angiography;  Surgeon: Katha Cabal, MD;  Location: [place] [facility];  Service: Cardiovascular;   Laterality: Left;  . PERIPHERAL VASCULAR CATHETERIZATION Left 05/04/2015   Procedure: Lower Extremity Intervention;  Surgeon: Katha Cabal, MD;  Location: [place] [facility];  Service: Cardiovascular;  Laterality: Left;  . SHOULDER ARTHROSCOPY WITH OPEN ROTATOR CUFF REPAIR Right 01/16/2017   Procedure: SHOULDER ARTHROSCOPY WITH OPEN ROTATOR CUFF REPAIR;  Surgeon: Thornton Park, MD;  Location: [facility];  Service: Orthopedics;  Laterality: Right;  . TONSILLECTOMY AND ADENOIDECTOMY  1971   Family History  Problem Relation Age of Onset  . Arthritis Mother   . Mental illness Mother        depression  . Diabetes Mother   . Cancer Mother        pancreatic  . Cancer Father        colon  . Heart disease Father   . Diabetes Father   . Cancer Daughter        lung   Social History   Socioeconomic History  . Marital status: Divorced    Spouse name: Not on file  . Number of children: Not on file  . Years of education: Not on file  . Highest education level: Not on file  Occupational History  . Not on file  Social Needs  . Financial resource strain: Not hard at all  . Food insecurity:    Worry: Never true    Inability: Never true  . Transportation needs:    Medical: No    Non-medical: No  Tobacco Use  . Smoking status: Current Every Day Smoker    Packs/day: 0.50    Years: 30.00    Pack years: 15.00    Types: Cigarettes  . Smokeless tobacco: Never Used  . Tobacco comment: Currently smoking 1/2 ppd  Substance and Sexual Activity  . Alcohol use: No    Alcohol/week: 0.0 standard drinks  . Drug use: No    Comment: Hx of.  . Sexual activity: Not Currently  Lifestyle  . Physical activity:    Days per week: 0 days    Minutes per session: Not on file  . Stress: Not on file  Relationships  . Social connections:    Talks on phone: Not on file    Gets together: Not on file    Attends religious service: Not on file    Active member of club or organization: Not on file     Attends meetings of clubs or organizations: Not on file    Relationship status: Not on file  . Intimate partner violence:    Fear of current or ex partner: No    Emotionally abused: No    Physically abused: No    Forced sexual activity: No  Other Topics Concern  . Not on file  Social History Narrative  . Not on file   Current Meds  Medication Sig  . ARIPiprazole (ABILIFY) 10 MG tablet Take 10 mg by mouth daily.  Marland Kitchen aspirin 81 MG tablet Take 81 mg by mouth daily.  Marland Kitchen atorvastatin (LIPITOR) 40 MG tablet Take 1 tablet (40 mg total) by mouth daily.  . citalopram (CELEXA) 20 MG tablet Take 20 mg by mouth daily.  . clonazePAM (KLONOPIN) 2 MG tablet Take 2 mg by mouth 4 (four) times daily.   . clopidogrel (PLAVIX) 75 MG tablet TAKE 1 TABLET BY MOUTH ONCE DAILY  . docusate sodium (COLACE) 100 MG capsule Take 200 mg by mouth 2 (two) times daily.   Marland Kitchen escitalopram (LEXAPRO) 20 MG tablet Take 20 mg by mouth daily.   . folic acid (FOLVITE) 1 MG tablet Take 2 mg by mouth 2 (two) times daily.  . furosemide (LASIX) 20 MG tablet Take 1 tablet (20 mg total) by mouth daily. In am  . HYDROcodone-acetaminophen (NORCO) 5-325 MG tablet Take 1-2 tablets by mouth every 6 (six) hours as needed for moderate pain or severe pain.  Marland Kitchen ibuprofen (ADVIL,MOTRIN) 200 MG tablet Take 600 mg by mouth daily as needed for mild pain or moderate pain.   Marland Kitchen lisinopril (PRINIVIL,ZESTRIL) 10 MG tablet Take 1 tablet (10 mg total) by mouth daily.  . methadone (DOLOPHINE) 10 MG tablet Take 110 mg by mouth daily.   . polyethylene glycol (MIRALAX / GLYCOLAX) packet Take 17 g by mouth daily as needed for mild constipation.  . traZODone (DESYREL) 100 MG tablet Take 200 mg by mouth at bedtime.   . [DISCONTINUED] clopidogrel (PLAVIX) 75 MG tablet Take 1 tablet (75 mg total) by mouth daily.   No Known Allergies Recent Results (from the past 2160 hour(s))  Comprehensive metabolic panel     Status: Abnormal   Collection Time: 04/10/18  2:28  PM  Result Value Ref Range   Sodium 138 135 - 145 mEq/L   Potassium 4.3 3.5 - 5.1 mEq/L   Chloride 101 96 - 112 mEq/L   CO2 35 (H) 19 - 32 mEq/L   Glucose, Bld 226 (H) 70 - 99 mg/dL   BUN 11 6 - 23 mg/dL  Creatinine, Ser 0.78 0.40 - 1.20 mg/dL   Total Bilirubin 0.3 0.2 - 1.2 mg/dL   Alkaline Phosphatase 82 39 - 117 U/L   AST 15 0 - 37 U/L   ALT 9 0 - 35 U/L   Total Protein 6.7 6.0 - 8.3 g/dL   Albumin 3.5 3.5 - 5.2 g/dL   Calcium 9.0 8.4 - 10.5 mg/dL   GFR 78.11 >60.00 mL/min  CBC with Differential/Platelet     Status: None   Collection Time: 04/10/18  2:28 PM  Result Value Ref Range   WBC 6.6 4.0 - 10.5 K/uL   RBC 4.08 3.87 - 5.11 Mil/uL   Hemoglobin 12.2 12.0 - 15.0 g/dL   HCT 37.1 36.0 - 46.0 %   MCV 90.9 78.0 - 100.0 fl   MCHC 33.0 30.0 - 36.0 g/dL   RDW 13.4 11.5 - 15.5 %   Platelets 311.0 150.0 - 400.0 K/uL   Neutrophils Relative % 65.9 43.0 - 77.0 %   Lymphocytes Relative 26.0 12.0 - 46.0 %   Monocytes Relative 5.0 3.0 - 12.0 %   Eosinophils Relative 2.3 0.0 - 5.0 %   Basophils Relative 0.8 0.0 - 3.0 %   Neutro Abs 4.3 1.4 - 7.7 K/uL   Lymphs Abs 1.7 0.7 - 4.0 K/uL   Monocytes Absolute 0.3 0.1 - 1.0 K/uL   Eosinophils Absolute 0.2 0.0 - 0.7 K/uL   Basophils Absolute 0.1 0.0 - 0.1 K/uL  TSH     Status: None   Collection Time: 04/10/18  2:28 PM  Result Value Ref Range   TSH 0.86 0.35 - 4.50 uIU/mL  Hemoglobin A1c     Status: None   Collection Time: 04/10/18  2:28 PM  Result Value Ref Range   Hgb A1c MFr Bld 6.3 4.6 - 6.5 %    Comment: Glycemic Control Guidelines for People with Diabetes:Non Diabetic:  <6%Goal of Therapy: <7%Additional Action Suggested:  >8%   Vitamin D (25 hydroxy)     Status: Abnormal   Collection Time: 04/10/18  2:28 PM  Result Value Ref Range   VITD 28.74 (L) 30.00 - 100.00 ng/mL   Objective  Body mass index is 30.52 kg/m. Wt Readings from Last 3 Encounters:  04/23/18 183 lb 6 oz (83.2 kg)  04/10/18 176 lb (79.8 kg)  02/28/18 169 lb  (76.7 kg)   Temp Readings from Last 3 Encounters:  04/23/18 98.5 F (36.9 C) (Oral)  04/10/18 99 F (37.2 C) (Oral)  12/17/17 98.4 F (36.9 C) (Oral)   BP Readings from Last 3 Encounters:  04/23/18 132/76  04/10/18 130/70  02/28/18 137/69   Pulse Readings from Last 3 Encounters:  04/23/18 70  04/10/18 84  02/28/18 (!) 56    Physical Exam  Constitutional: She is oriented to person, place, and time. Vital signs are normal. She appears well-developed and well-nourished. She is cooperative.  HENT:  Head: Normocephalic and atraumatic.  Mouth/Throat: Oropharynx is clear and moist and mucous membranes are normal.  Eyes: Pupils are equal, round, and reactive to light. Conjunctivae are normal.  Cardiovascular: Normal rate, regular rhythm and normal heart sounds.  Pulmonary/Chest: Effort normal and breath sounds normal.  Abdominal: Soft. Bowel sounds are normal.  Musculoskeletal:       Lumbar back: She exhibits tenderness.       Back:  Neurological: She is alert and oriented to person, place, and time. Gait normal.  Skin: Skin is warm, dry and intact.  Psychiatric: She has a normal  mood and affect. Her speech is normal and behavior is normal. Judgment and thought content normal. Cognition and memory are normal.  Nursing note and vitals reviewed.   Assessment   1. Chronic leg edema left s/p 2 stents  2. Low back pain  3. Diarrhea resolved  4. HM 5. Chronic right shoulder pain s/p rotator cuff surgery  Plan   1. F/u [facility] Dr. Delana Meyer  2. Xray today consider MRI and PT Prn NDAIDS for now  3. ntd  4 . Will get flu shot next visit  Tdap, shingrix disc wants to wait   Due pna 23  Declines MMR check   Will need mammo pt declines today  S/p hysterectomy cyst of ovary noncancerous 1 ovary remaining  Colonoscopy never had declines consider and disc cologaurd  Dexa disc hold for now  Derm consider future  Smoker 50+ years <1 ppd cut back recently daughter died 09-03-16 lung cancer  and she was a smoker but thought not related to smoking    5. Pt will let me know when ready to go back to [facility] Dr. Raliegh Ip  Provider: Dr. Olivia Mackie [name]-Internal Medicine

## 2018-06-02 ENCOUNTER — Ambulatory Visit (INDEPENDENT_AMBULATORY_CARE_PROVIDER_SITE_OTHER): Payer: Medicare Other | Admitting: Vascular Surgery

## 2018-06-02 ENCOUNTER — Encounter (INDEPENDENT_AMBULATORY_CARE_PROVIDER_SITE_OTHER): Payer: Self-pay | Admitting: Vascular Surgery

## 2018-06-02 ENCOUNTER — Ambulatory Visit (INDEPENDENT_AMBULATORY_CARE_PROVIDER_SITE_OTHER): Payer: Medicare Other

## 2018-06-02 ENCOUNTER — Encounter

## 2018-06-02 VITALS — BP 113/59 | HR 58 | Resp 17 | Ht 65.0 in | Wt 180.0 lb

## 2018-06-02 DIAGNOSIS — I10 Essential (primary) hypertension: Secondary | ICD-10-CM

## 2018-06-02 DIAGNOSIS — M79606 Pain in leg, unspecified: Secondary | ICD-10-CM

## 2018-06-02 DIAGNOSIS — Z9862 Peripheral vascular angioplasty status: Secondary | ICD-10-CM

## 2018-06-02 DIAGNOSIS — E119 Type 2 diabetes mellitus without complications: Secondary | ICD-10-CM | POA: Diagnosis not present

## 2018-06-02 DIAGNOSIS — I70213 Atherosclerosis of native arteries of extremities with intermittent claudication, bilateral legs: Secondary | ICD-10-CM

## 2018-06-02 DIAGNOSIS — F17210 Nicotine dependence, cigarettes, uncomplicated: Secondary | ICD-10-CM

## 2018-06-02 DIAGNOSIS — M5442 Lumbago with sciatica, left side: Secondary | ICD-10-CM

## 2018-06-02 DIAGNOSIS — I739 Peripheral vascular disease, unspecified: Secondary | ICD-10-CM

## 2018-06-02 DIAGNOSIS — M79605 Pain in left leg: Secondary | ICD-10-CM

## 2018-06-02 DIAGNOSIS — G8929 Other chronic pain: Secondary | ICD-10-CM | POA: Diagnosis not present

## 2018-06-02 DIAGNOSIS — M5441 Lumbago with sciatica, right side: Secondary | ICD-10-CM | POA: Diagnosis not present

## 2018-06-03 ENCOUNTER — Encounter (INDEPENDENT_AMBULATORY_CARE_PROVIDER_SITE_OTHER): Payer: Self-pay | Admitting: Vascular Surgery

## 2018-06-03 NOTE — Progress Notes (Signed)
MRN : [id number]  Yesenia Shepard is a 67 y.o. (1951/04/16) female who presents with chief complaint of  Chief Complaint  Patient presents with  . Follow-up    3 month ABI and Arterial  .  History of Present Illness:   The patient returns to the office for followup and review of the noninvasive studies. There has been a significant deterioration in the left lower extremity symptoms.  The patient notes interval shortening of their claudication distance and development of severe rest pain symptoms.  She describes the pain as beginning in her lower back and extending into her buttock and lateral thigh.  She denies left foot pain at rest.  No new ulcers or wounds have occurred since the last visit.  Patient states she does have a history lower back degenerative disease and past episodes of sciatica.  There have been no significant changes to the patient's overall health care.  The patient denies amaurosis fugax or recent TIA symptoms. There are no recent neurological changes noted. The patient denies history of DVT, PE or superficial thrombophlebitis. The patient denies recent episodes of angina or shortness of breath.   ABI's Rt= 0.97 and Lt= 0.77 (previous ABI's Rt= 0.99 and Lt= 1.04) the left ABI which had normalized after her previous intervention is now back to her pre-intervention level.  Duplex US of the lower extremity arterial system shows SFA is still patent however there is monophasic flow from the proximal SFA to the ankle indicating severe occlusive disease in the left lower extremity below the level of the common femoral artery.  Common femoral and profunda femoris arteries triphasic implying normal blood flow from the aorta to the level of the thigh.  Current Meds  Medication Sig  . ARIPiprazole (ABILIFY) 10 MG tablet Take 10 mg by mouth daily.  Marland Kitchen aspirin 81 MG tablet Take 81 mg by mouth daily.  Marland Kitchen atorvastatin (LIPITOR) 40 MG tablet Take 1 tablet (40 mg total) by mouth daily.   . citalopram (CELEXA) 20 MG tablet Take 20 mg by mouth daily.  . clonazePAM (KLONOPIN) 2 MG tablet Take 2 mg by mouth 4 (four) times daily.   . clopidogrel (PLAVIX) 75 MG tablet TAKE 1 TABLET BY MOUTH ONCE DAILY  . docusate sodium (COLACE) 100 MG capsule Take 200 mg by mouth 2 (two) times daily.   Marland Kitchen escitalopram (LEXAPRO) 20 MG tablet Take 20 mg by mouth daily.   . folic acid (FOLVITE) 1 MG tablet Take 2 mg by mouth 2 (two) times daily.  . furosemide (LASIX) 20 MG tablet Take 1 tablet (20 mg total) by mouth daily. In am  . ibuprofen (ADVIL,MOTRIN) 200 MG tablet Take 600 mg by mouth daily as needed for mild pain or moderate pain.   Marland Kitchen lisinopril (PRINIVIL,ZESTRIL) 10 MG tablet Take 1 tablet (10 mg total) by mouth daily.  . methadone (DOLOPHINE) 10 MG tablet Take 110 mg by mouth daily.   . polyethylene glycol (MIRALAX / GLYCOLAX) packet Take 17 g by mouth daily as needed for mild constipation.  . traZODone (DESYREL) 100 MG tablet Take 200 mg by mouth at bedtime.     Past Medical History:  Diagnosis Date  . Anxiety   . Arthritis   . Chronic pain   . Depression   . Drug abuse ([place])    History of polysubstance abuse; currently on methadone.  . Hepatitis    History of Hep "C". treated and cured with Harvoni  . History of chicken  pox   . Hypertension   . Panic attacks   . Peripheral vascular disease ([place])    stent in place.   . Pre-diabetes   . UTI (urinary tract infection)    History of    Past Surgical History:  Procedure Laterality Date  . ABDOMINAL HYSTERECTOMY  1989  . CHOLECYSTECTOMY  1987  . INTRACAPSULAR CATARACT EXTRACTION Left   . LOWER EXTREMITY ANGIOGRAPHY Left 12/17/2017   Procedure: LOWER EXTREMITY ANGIOGRAPHY;  Surgeon: Katha Cabal, MD;  Location: [facility];  Service: Cardiovascular;  Laterality: Left;  . PERIPHERAL VASCULAR CATHETERIZATION Left 05/04/2015   Procedure: Lower Extremity Angiography;  Surgeon: Katha Cabal, MD;  Location: [facility];  Service: Cardiovascular;  Laterality: Left;  . PERIPHERAL VASCULAR CATHETERIZATION Left 05/04/2015   Procedure: Lower Extremity Intervention;  Surgeon: Katha Cabal, MD;  Location: [facility];  Service: Cardiovascular;  Laterality: Left;  . SHOULDER ARTHROSCOPY WITH OPEN ROTATOR CUFF REPAIR Right 01/16/2017   Procedure: SHOULDER ARTHROSCOPY WITH OPEN ROTATOR CUFF REPAIR;  Surgeon: Thornton Park, MD;  Location: [facility];  Service: Orthopedics;  Laterality: Right;  . TONSILLECTOMY AND ADENOIDECTOMY  1971    Social History Social History   Tobacco Use  . Smoking status: Current Every Day Smoker    Packs/day: 0.50    Years: 30.00    Pack years: 15.00    Types: Cigarettes  . Smokeless tobacco: Never Used  . Tobacco comment: Currently smoking 1/2 ppd  Substance Use Topics  . Alcohol use: No    Alcohol/week: 0.0 standard drinks  . Drug use: No    Comment: Hx of.    Family History Family History  Problem Relation Age of Onset  . Arthritis Mother   . Mental illness Mother        depression  . Diabetes Mother   . Cancer Mother        pancreatic  . Cancer Father        colon  . Heart disease Father   . Diabetes Father   . Cancer Daughter        lung    No Known Allergies   REVIEW OF SYSTEMS (Negative unless checked)  Constitutional: [] Weight loss  [] Fever  [] Chills Cardiac: [] Chest pain   [] Chest pressure   [] Palpitations   [] Shortness of breath when laying flat   [] Shortness of breath with exertion. Vascular:  [x] Pain in legs with walking   [x] Pain in legs at rest  [] History of DVT   [] Phlebitis   [] Swelling in legs   [] Varicose veins   [] Non-healing ulcers Pulmonary:   [] Uses home oxygen   [] Productive cough   [] Hemoptysis   [] Wheeze  [] COPD   [] Asthma Neurologic:  [] Dizziness   [] Seizures   [] History of stroke   [] History of TIA  [] Aphasia   [] Vissual changes   [] Weakness or numbness in arm   [x] Weakness or numbness in  leg Musculoskeletal:   [] Joint swelling   [x] Joint pain   [x] Low back pain Hematologic:  [] Easy bruising  [] Easy bleeding   [] Hypercoagulable state   [] Anemic Gastrointestinal:  [] Diarrhea   [] Vomiting  [] Gastroesophageal reflux/heartburn   [] Difficulty swallowing. Genitourinary:  [] Chronic kidney disease   [] Difficult urination  [] Frequent urination   [] Blood in urine Skin:  [] Rashes   [] Ulcers  Psychological:  [] History of anxiety   []  History of major depression.  Physical Examination  Vitals:   06/02/18 1440  BP: (!) 113/59  Pulse: Marland Kitchen)  58  Resp: 17  Weight: 180 lb (81.6 kg)  Height: 5\' 5"  (1.651 m)   Body mass index is 29.95 kg/m. Gen: WD/WN, NAD Head: [place]/AT, No temporalis wasting.  Ear/Nose/Throat: Hearing grossly intact, nares w/o erythema or drainage Eyes: PER, EOMI, sclera nonicteric.  Neck: Supple, no large masses.   Pulmonary:  Good air movement, no audible wheezing bilaterally, no use of accessory muscles.  Cardiac: RRR, no JVD Vascular: 1-2+ edema bilateral lower extremities Vessel Right Left  Radial Palpable Palpable  Popliteal Palpable  not palpable  PT Palpable  not palpable  DP Palpable  not palpable  Gastrointestinal: Non-distended. No guarding/no peritoneal signs.  Musculoskeletal: M/S 5/5 throughout.  No deformity or atrophy.  Neurologic: CN 2-12 intact. Symmetrical.  Speech is fluent. Motor exam as listed above. Psychiatric: Judgment intact, Mood & affect appropriate for pt's clinical situation. Dermatologic: No rashes or ulcers noted.  No changes consistent with cellulitis. Lymph : No lichenification or skin changes of chronic lymphedema.  CBC Lab Results  Component Value Date   WBC 6.6 04/10/2018   HGB 12.2 04/10/2018   HCT 37.1 04/10/2018   MCV 90.9 04/10/2018   PLT 311.0 04/10/2018    BMET    Component Value Date/Time   NA 138 04/10/2018 1428   NA 139 02/04/2015 1305   K 4.3 04/10/2018 1428   CL 101 04/10/2018 1428   CO2 35 (H)  04/10/2018 1428   GLUCOSE 226 (H) 04/10/2018 1428   BUN 11 04/10/2018 1428   BUN 10 02/04/2015 1305   CREATININE 0.78 04/10/2018 1428   CREATININE 0.76 01/05/2016 1524   CALCIUM 9.0 04/10/2018 1428   GFRNONAA >60 12/17/2017 1024   GFRNONAA 83 01/05/2016 1524   GFRAA >60 12/17/2017 1024   GFRAA >89 01/05/2016 1524   CrCl cannot be calculated (Patient's most recent lab result is older than the maximum 21 days allowed.).  COAG Lab Results  Component Value Date   INR 1.06 01/10/2017   INR 1.17 10/18/2015    Radiology No results found.   Assessment/Plan 1. Chronic midline low back pain with bilateral sciatica  Recommend:  The patient has atypical pain symptoms for pure atherosclerotic disease.  She has triphasic flow to the level of the common femoral and profunda femoris and therefore hip and buttock symptoms would not be expected from a vascular standpoint.  She also has a history of degenerative disease of the lumbar spine and past episodes of sciatica.    Noninvasive studies including ABI's demonstrate more distal left lower extremity disease.  Her previous intervention appears to be patent however there appears to be significant recurrent stenosis which should be addressed but is not an urgent problem.  I discussed this with the patient.  Given the patient's symptoms and her past history she has requested evaluation of her lower back.  I would agree with this plan.  I will make a referral to Dr. Cari Caraway for further evaluation of her lumbar sacral degenerative disease.  The patient should continue walking as best she can and begin a more formal exercise program. The patient should continue his antiplatelet therapy and aggressive treatment of the lipid abnormalities.  The patient should begin wearing graduated compression socks 15-20 mmHg strength to control edema.   - Ambulatory referral to Neurosurgery  2. Pain of left lower extremity See #1 - Ambulatory referral to  Neurosurgery  3. Peripheral vascular disease ([place]) Recommend:  The patient is status post successful angiogram with intervention.  The patient reports  that the claudication symptoms and leg pain are unchanged and continue to be a major issue.   The patient continues to voice lifestyle limiting changes at this point in time.  Patient should undergo noninvasive studies as ordered. The patient will follow up with me after her evaluation with neurosurgery.   In the mean time the patient should continue walking and an exercise program.  The patient should continue antiplatelet therapy and aggressive treatment of the lipid abnormalities  The patient should continue wearing graduated compression socks 10-15 mmHg strength to control the mild edema.    4. Essential hypertension Continue antihypertensive medications as already ordered, these medications have been reviewed and there are no changes at this time.   5. Type 2 diabetes mellitus without complication, without long-term current use of insulin (HCC) Continue hypoglycemic medications as already ordered, these medications have been reviewed and there are no changes at this time.  Hgb A1C to be monitored as already arranged by primary service     Hortencia Pilar, MD  06/03/2018 9:21 AM

## 2018-06-04 ENCOUNTER — Ambulatory Visit: Payer: Medicare Other | Admitting: Internal Medicine

## 2018-06-11 ENCOUNTER — Ambulatory Visit: Payer: Medicare Other | Admitting: Internal Medicine

## 2018-06-12 ENCOUNTER — Telehealth (INDEPENDENT_AMBULATORY_CARE_PROVIDER_SITE_OTHER): Payer: Self-pay | Admitting: Vascular Surgery

## 2018-06-12 NOTE — Telephone Encounter (Signed)
I spoke with the patient and she inform that Dr Izora Ribas want to see if Dr Delana Meyer can put in a referral for MRI and I spoke with [place] and the MRI referral will put in

## 2018-06-18 ENCOUNTER — Telehealth (INDEPENDENT_AMBULATORY_CARE_PROVIDER_SITE_OTHER): Payer: Self-pay | Admitting: Vascular Surgery

## 2018-06-19 ENCOUNTER — Telehealth: Payer: Self-pay

## 2018-06-19 NOTE — Telephone Encounter (Signed)
Pt needs to call Dr. Saralyn Pilar office and reschedule this visit for chronic back pain   [place]

## 2018-06-19 NOTE — Telephone Encounter (Signed)
Copied from [place] [id number]. Topic: General - Inquiry >> Jun 19, 2018 10:05 AM Conception Chancy, NT wrote: Reason for CRM: patient is calling and states she spoke with someone about getting a note from pcp for [employer]. She states due to back pain she can not sit through it. She states the letter needs to be sent to Heloise Beecham with [facility] [place] [phone]

## 2018-06-19 NOTE — Telephone Encounter (Signed)
Please advise 

## 2018-06-19 NOTE — Telephone Encounter (Signed)
Copied from [place] [id number]. Topic: General - Other >> Jun 19, 2018 10:05 AM Janace Aris A wrote: Reason for CRM: pt called in to inform the office that she would not be able to make her specialist appt, due to her being summoned for jury duty the same day.

## 2018-06-19 NOTE — Telephone Encounter (Signed)
This will need  to come from her PCP, she has the complaint of back pain and we do not see her for back pain.

## 2018-06-20 NOTE — Telephone Encounter (Signed)
Left message for patient to return call back. [facility] may give information.  

## 2018-06-25 ENCOUNTER — Encounter: Payer: Self-pay | Admitting: Internal Medicine

## 2018-06-25 NOTE — Telephone Encounter (Signed)
Call pt letter is ready or fax to info below   [place]

## 2018-06-26 ENCOUNTER — Ambulatory Visit (INDEPENDENT_AMBULATORY_CARE_PROVIDER_SITE_OTHER): Payer: Medicare Other | Admitting: Vascular Surgery

## 2018-06-26 NOTE — Telephone Encounter (Signed)
Left message for patient to return call back. [facility] may give information. Letter has been mailed.

## 2018-06-30 ENCOUNTER — Ambulatory Visit: Payer: Medicare Other | Admitting: Internal Medicine

## 2018-07-14 ENCOUNTER — Ambulatory Visit (INDEPENDENT_AMBULATORY_CARE_PROVIDER_SITE_OTHER): Payer: Medicare HMO | Admitting: Vascular Surgery

## 2018-07-23 ENCOUNTER — Encounter: Payer: Self-pay | Admitting: Internal Medicine

## 2018-07-23 ENCOUNTER — Ambulatory Visit (INDEPENDENT_AMBULATORY_CARE_PROVIDER_SITE_OTHER): Payer: Medicare HMO | Admitting: Internal Medicine

## 2018-07-23 VITALS — BP 126/64 | HR 64 | Temp 99.1°F | Ht 65.0 in | Wt 180.0 lb

## 2018-07-23 DIAGNOSIS — M25561 Pain in right knee: Secondary | ICD-10-CM | POA: Diagnosis not present

## 2018-07-23 DIAGNOSIS — M5416 Radiculopathy, lumbar region: Secondary | ICD-10-CM

## 2018-07-23 DIAGNOSIS — G8929 Other chronic pain: Secondary | ICD-10-CM | POA: Diagnosis not present

## 2018-07-23 DIAGNOSIS — G4700 Insomnia, unspecified: Secondary | ICD-10-CM | POA: Diagnosis not present

## 2018-07-23 DIAGNOSIS — M5136 Other intervertebral disc degeneration, lumbar region: Secondary | ICD-10-CM

## 2018-07-23 DIAGNOSIS — M25562 Pain in left knee: Secondary | ICD-10-CM | POA: Diagnosis not present

## 2018-07-23 DIAGNOSIS — M549 Dorsalgia, unspecified: Secondary | ICD-10-CM | POA: Diagnosis not present

## 2018-07-23 DIAGNOSIS — I10 Essential (primary) hypertension: Secondary | ICD-10-CM

## 2018-07-23 DIAGNOSIS — W19XXXD Unspecified fall, subsequent encounter: Secondary | ICD-10-CM | POA: Diagnosis not present

## 2018-07-23 DIAGNOSIS — R269 Unspecified abnormalities of gait and mobility: Secondary | ICD-10-CM

## 2018-07-23 NOTE — Patient Instructions (Signed)
Yesenia Shepard, Yesenia Shepard [id number] [facility]  [phone] 9 am to 5 pm daily    Arthritis Arthritis is a term that is commonly used to refer to joint pain or joint disease. There are more than 100 types of arthritis. What are the causes? The most common cause of this condition is wear and tear of a joint. Other causes include:  Gout.  Inflammation of a joint.  An infection of a joint.  Sprains and other injuries near the joint.  A drug reaction or allergic reaction. In some cases, the cause may not be known. What are the signs or symptoms? The main symptom of this condition is pain in the joint with movement. Other symptoms include:  Redness, swelling, or stiffness at a joint.  Warmth coming from the joint.  Fever.  Overall feeling of illness. How is this diagnosed? This condition may be diagnosed with a physical exam and tests, including:  Blood tests.  Urine tests.  Imaging tests, such as MRI, X-rays, or a CT scan. Sometimes, fluid is removed from a joint for testing. How is this treated? Treatment for this condition may involve:  Treatment of the cause, if it is known.  Rest.  Raising (elevating) the joint.  Applying cold or hot packs to the joint.  Medicines to improve symptoms and reduce inflammation.  Injections of a steroid such as cortisone into the joint to help reduce pain and inflammation. Depending on the cause of your arthritis, you may need to make lifestyle changes to reduce stress on your joint. These changes may include exercising more and losing weight. Follow these instructions at home: Medicines  Take over-the-counter and prescription medicines only as told by your health care provider.  Do not take aspirin to relieve pain if gout is suspected. Activity  Rest your joint if told by your health care provider. Rest is important when your disease is active and your joint feels painful, swollen, or stiff.  Avoid  activities that make the pain worse. It is important to balance activity with rest.  Exercise your joint regularly with range-of-motion exercises as told by your health care provider. Try doing low-impact exercise, such as: ? Swimming. ? Water aerobics. ? Biking. ? Walking. Joint Care   If your joint is swollen, keep it elevated if told by your health care provider.  If your joint feels stiff in the morning, try taking a warm shower.  If directed, apply heat to the joint. If you have diabetes, do not apply heat without permission from your health care provider. ? Put a towel between the joint and the hot pack or heating pad. ? Leave the heat on the area for 20-30 minutes.  If directed, apply ice to the joint: ? Put ice in a plastic bag. ? Place a towel between your skin and the bag. ? Leave the ice on for 20 minutes, 2-3 times per day.  Keep all follow-up visits as told by your health care provider. This is important. Contact a health care provider if:  The pain gets worse.  You have a fever. Get help right away if:  You develop severe joint pain, swelling, or redness.  Many joints become painful and swollen.  You develop severe back pain.  You develop severe weakness in your leg.  You cannot control your bladder or bowels. This information is not intended to replace advice given to you by your health care provider. Make sure you discuss any questions you  have with your health care provider. Document Released: 08/02/2004 Document Revised: 12/01/2015 Document Reviewed: 09/20/2014 Elsevier Interactive Patient Education  2019 [employer]  Lumbosacral Radiculopathy Lumbosacral radiculopathy is a condition that involves the spinal nerves and nerve roots in the low back and bottom of the spine. The condition develops when these nerves and nerve roots move out of place or become inflamed and cause symptoms. What are the causes? This condition may be caused by:  Pressure  from a disk that bulges out of place (herniated disk). A disk is a plate of soft cartilage that separates bones in the spine.  Disk changes that occur with age (disk degeneration).  A narrowing of the bones of the lower back (spinal stenosis).  A tumor.  An infection.  An injury that places sudden pressure on the disks that cushion the bones of your lower spine. What increases the risk? You are more likely to develop this condition if:  You are a female who is 23-60 years old.  You are a female who is 60-52 years old.  You use improper technique when lifting things.  You are overweight or live a sedentary lifestyle.  Your work requires frequent lifting.  You smoke.  You do repetitive activities that strain the spine. What are the signs or symptoms? Symptoms of this condition include:  Pain that goes down from your back into your legs (sciatica), usually on one side of the body. This is the most common symptom. The pain may be worse with sitting, coughing, or sneezing.  Pain and numbness in your legs.  Muscle weakness.  Tingling.  Loss of bladder control or bowel control. How is this diagnosed? This condition may be diagnosed based on:  Your symptoms and medical history.  A physical exam. If the pain is lasting, you may have tests, such as:  MRI scan.  X-ray.  CT scan.  A type of X-ray used to examine the spinal canal after injecting a dye into your spine (myelogram).  A test to measure how electrical impulses move through a nerve (nerve conduction study). How is this treated? Treatment may depend on the cause of the condition and may include:  Working with a physical therapist.  Taking pain medicine.  Applying heat and ice to affected areas.  Doing stretches to improve flexibility.  Doing exercises to strengthen back muscles.  Having chiropractic spinal manipulation.  Using transcutaneous electrical nerve stimulation (TENS) therapy.  Getting a  steroid injection in the spine. In some cases, no treatment is needed. If the condition is long-lasting (chronic), or if symptoms are severe, treatment may involve surgery or lifestyle changes, such as following a weight-loss plan. Follow these instructions at home: Activity  Avoid bending and other activities that make the problem worse.  Maintain a proper position when standing or sitting: ? When standing, keep your upper back and neck straight, with your shoulders pulled back. Avoid slouching. ? When sitting, keep your back straight and relax your shoulders. Do not round your shoulders or pull them backward.  Do not sit or stand in one place for long periods of time.  Take brief periods of rest throughout the day. This will reduce your pain. It is usually better to rest by lying down or standing, not sitting.  When you are resting for longer periods, mix in some mild activity or stretching between periods of rest. This will help to prevent stiffness and pain.  Get regular exercise. Ask your health care provider what activities are  safe for you. If you were shown how to do any exercises or stretches, do them as directed by your health care provider.  Do not lift anything that is heavier than 10 lb (4.5 kg) or the limit that you are told by your health care provider. Always use proper lifting technique, which includes: ? Bending your knees. ? Keeping the load close to your body. ? Avoiding twisting. Managing pain  If directed, put ice on the affected area: ? Put ice in a plastic bag. ? Place a towel between your skin and the bag. ? Leave the ice on for 20 minutes, 2-3 times a day.  If directed, apply heat to the affected area as often as told by your health care provider. Use the heat source that your health care provider recommends, such as a moist heat pack or a heating pad. ? Place a towel between your skin and the heat source. ? Leave the heat on for 20-30 minutes. ? Remove the  heat if your skin turns bright red. This is especially important if you are unable to feel pain, heat, or cold. You may have a greater risk of getting burned.  Take over-the-counter and prescription medicines only as told by your health care provider. General instructions  Sleep on a firm mattress in a comfortable position. Try lying on your side with your knees slightly bent. If you lie on your back, put a pillow under your knees.  Do not drive or use heavy machinery while taking prescription pain medicine.  If your health care provider prescribed a diet or exercise program, follow it as directed.  Keep all follow-up visits as told by your health care provider. This is important. Contact a health care provider if:  Your pain does not improve over time, even when taking pain medicines. Get help right away if:  You develop severe pain.  Your pain suddenly gets worse.  You develop increasing weakness in your legs.  You lose the ability to control your bladder or bowel.  You have difficulty walking or balancing.  You have a fever. Summary  Lumbosacral radiculopathy is a condition that occurs when the spinal nerves and nerve roots in the lower part of the spine move out of place or become inflamed and cause symptoms.  Symptoms include pain, numbness, and tingling that go down from your back into your legs (sciatica), muscle weakness, and loss of bladder control or bowel control.  If directed, apply ice or heat to the affected area as told by your health care provider.  Follow instructions about activity, rest, and proper lifting technique. This information is not intended to replace advice given to you by your health care provider. Make sure you discuss any questions you have with your health care provider. Document Released: 06/25/2005 Document Revised: 06/13/2017 Document Reviewed: 06/13/2017 Elsevier Interactive Patient Education  2019 [employer].

## 2018-07-23 NOTE — Progress Notes (Signed)
Pre visit review using our clinic review tool, if applicable. No additional management support is needed unless otherwise documented below in the visit note. 

## 2018-07-23 NOTE — Progress Notes (Signed)
Chief Complaint  Patient presents with  . Follow-up   F/u  1. Chronic pain low back and legs and knees with 3-4 or more falls since 04/2018 at home and trouble walking and bending at the left knee. No bowel/bladder incontinence or saddle anesthesia but legs are weak. Xray 04/23/18 lumbar. She is in wheelchair today and unable to walk and wants a walker for home use. Not taking narcotics, pain is 10/10. mobic is not helping    IMPRESSION: 1. Mild curvature of the lumbar spine convex left by 20 degrees. No acute compression deformity. 2. Intervertebral disc spaces appear normal. 3. Large amount of feces throughout the colon.  She denies h/a, dizziness or LOC as reason for fall  2. Insomnia on trazadone and klonopin outside provider  3. Tobacco abuse still smoking declines flu and pna 23 vaccine today   No dizziness or headache leading to falls   2. Insomnia last night and h/o trouble sleeping   3. HTN on lis 10 mg qd   Review of Systems  Constitutional: Negative for weight loss.  HENT: Negative for hearing loss.   Eyes: Negative for blurred vision.  Respiratory: Negative for shortness of breath.   Cardiovascular: Negative for chest pain.  Gastrointestinal: Negative for abdominal pain.  Musculoskeletal: Positive for back pain, falls and joint pain.  Skin: Negative for rash.  Neurological: Negative for headaches.  Psychiatric/Behavioral: Negative for depression. The patient has insomnia.    Past Medical History:  Diagnosis Date  . Anxiety   . Arthritis   . Chronic pain   . Depression   . Drug abuse ([place])    History of polysubstance abuse; currently on methadone.  . Hepatitis    History of Hep "C". treated and cured with Harvoni  . History of chicken pox   . Hypertension   . Panic attacks   . Peripheral vascular disease ([place])    stent in place.   . Pre-diabetes   . UTI (urinary tract infection)    History of   Past Surgical History:  Procedure Laterality Date  .  ABDOMINAL HYSTERECTOMY  1989  . CHOLECYSTECTOMY  1987  . INTRACAPSULAR CATARACT EXTRACTION Left   . LOWER EXTREMITY ANGIOGRAPHY Left 12/17/2017   Procedure: LOWER EXTREMITY ANGIOGRAPHY;  Surgeon: Katha Cabal, MD;  Location: [facility];  Service: Cardiovascular;  Laterality: Left;  . PERIPHERAL VASCULAR CATHETERIZATION Left 05/04/2015   Procedure: Lower Extremity Angiography;  Surgeon: Katha Cabal, MD;  Location: [facility];  Service: Cardiovascular;  Laterality: Left;  . PERIPHERAL VASCULAR CATHETERIZATION Left 05/04/2015   Procedure: Lower Extremity Intervention;  Surgeon: Katha Cabal, MD;  Location: [facility];  Service: Cardiovascular;  Laterality: Left;  . SHOULDER ARTHROSCOPY WITH OPEN ROTATOR CUFF REPAIR Right 01/16/2017   Procedure: SHOULDER ARTHROSCOPY WITH OPEN ROTATOR CUFF REPAIR;  Surgeon: Thornton Park, MD;  Location: [facility];  Service: Orthopedics;  Laterality: Right;  . TONSILLECTOMY AND ADENOIDECTOMY  1971   Family History  Problem Relation Age of Onset  . Arthritis Mother   . Mental illness Mother        depression  . Diabetes Mother   . Cancer Mother        pancreatic  . Cancer Father        colon  . Heart disease Father   . Diabetes Father   . Cancer Daughter        lung   Social History   Socioeconomic History  .  Marital status: Divorced    Spouse name: Not on file  . Number of children: Not on file  . Years of education: Not on file  . Highest education level: Not on file  Occupational History  . Not on file  Social Needs  . Financial resource strain: Not hard at all  . Food insecurity:    Worry: Never true    Inability: Never true  . Transportation needs:    Medical: No    Non-medical: No  Tobacco Use  . Smoking status: Current Every Day Smoker    Packs/day: 0.50    Years: 30.00    Pack years: 15.00    Types: Cigarettes  . Smokeless tobacco: Never Used  . Tobacco comment: Currently smoking 1/2  ppd  Substance and Sexual Activity  . Alcohol use: No    Alcohol/week: 0.0 standard drinks  . Drug use: No    Comment: Hx of.  . Sexual activity: Not Currently  Lifestyle  . Physical activity:    Days per week: 0 days    Minutes per session: Not on file  . Stress: Not on file  Relationships  . Social connections:    Talks on phone: Not on file    Gets together: Not on file    Attends religious service: Not on file    Active member of club or organization: Not on file    Attends meetings of clubs or organizations: Not on file    Relationship status: Not on file  . Intimate partner violence:    Fear of current or ex partner: No    Emotionally abused: No    Physically abused: No    Forced sexual activity: No  Other Topics Concern  . Not on file  Social History Narrative   1 daughter died in 02-Sep-2016    No outpatient medications have been marked as taking for the 07/23/18 encounter (Office Visit) with [name], [name], MD.   No Known Allergies No results found for this or any previous visit (from the past 2158-09-02 hour(s)). Objective  Body mass index is 29.95 kg/m. Wt Readings from Last 3 Encounters:  07/23/18 180 lb (81.6 kg)  06/02/18 180 lb (81.6 kg)  04/23/18 183 lb 6 oz (83.2 kg)   Temp Readings from Last 3 Encounters:  07/23/18 99.1 F (37.3 C) (Oral)  04/23/18 98.5 F (36.9 C) (Oral)  04/10/18 99 F (37.2 C) (Oral)   BP Readings from Last 3 Encounters:  07/23/18 126/64  06/02/18 (!) 113/59  04/23/18 132/76   Pulse Readings from Last 3 Encounters:  07/23/18 64  06/02/18 (!) 58  04/23/18 70    Physical Exam Vitals signs and nursing note reviewed.  Constitutional:      Appearance: Normal appearance. She is well-developed and overweight.  HENT:     Head: Normocephalic and atraumatic.     Nose: Nose normal.     Mouth/Throat:     Mouth: Mucous membranes are moist.     Pharynx: Oropharynx is clear.  Eyes:     Conjunctiva/sclera: Conjunctivae normal.      Pupils: Pupils are equal, round, and reactive to light.  Cardiovascular:     Rate and Rhythm: Normal rate and regular rhythm.     Heart sounds: Normal heart sounds.  Pulmonary:     Effort: Pulmonary effort is normal.     Breath sounds: Wheezing present.  Musculoskeletal:     Right knee: Tenderness found. Medial joint line, lateral joint line and  patellar tendon tenderness noted.     Left knee: Tenderness found. Medial joint line, lateral joint line and patellar tendon tenderness noted.     Lumbar back: She exhibits tenderness.  Skin:    General: Skin is warm and dry.  Neurological:     General: No focal deficit present.     Mental Status: She is alert and oriented to person, place, and time.     Gait: Gait normal.     Comments: In wheelchair today  Appears sleepy today    Psychiatric:        Attention and Perception: Attention and perception normal.        Mood and Affect: Mood and affect normal.        Speech: Speech normal.        Behavior: Behavior normal. Behavior is cooperative.        Thought Content: Thought content normal.        Cognition and Memory: Cognition and memory normal.        Judgment: Judgment normal.     Assessment   1. Chronic pain (mid and low back with radiculopathy), b/l knees) with falls 2. Insomnia  3. HTN controlled  4. HM Plan   1. Xray mid back and b/l knees  MRI low back  Rx rolling walker with seat and brakes  Refer Dr. Cari Caraway NS  2. On klonopin 2 mg whs  3. Cont meds  4.  Declines flu and pna 23 vx today  Tdap, shingrix disc wants to wait    Declines MMR check   Will need mammo pt declines today again  S/p hysterectomy cyst of ovary noncancerous 1 ovary remaining  Colonoscopy never had declines consider and disc cologaurd  Dexa disc hold for now  Derm consider future  Smoker 50+ years <1 ppd cut back recently daughter died 09/12/16 lung cancer and she was a smoker but thought not related to smoking  -rec smoking cessation   Will need to do lipid in future   Provider: Dr. Olivia Mackie [name]-[facility]

## 2018-07-24 ENCOUNTER — Encounter: Payer: Self-pay | Admitting: Internal Medicine

## 2018-08-05 ENCOUNTER — Telehealth: Payer: Self-pay

## 2018-08-05 NOTE — Telephone Encounter (Signed)
Copied from [place] [id number]. Topic: General - Other >> [place] 28, 2020  8:31 AM Leward Quan A wrote: Reason for CRM: Dr Ardelle Lesches with student health at the [employer] [facility] called to say they received office notes and Rx for a rolling walker but she will shred it since she realize it was a mistake. Please be informed that this need to be refaxed to the proper fax number

## 2018-08-06 ENCOUNTER — Telehealth: Payer: Self-pay | Admitting: *Deleted

## 2018-08-06 NOTE — Telephone Encounter (Signed)
Copied from [place]. Topic: General - Other >> [place] 29, 2020 10:04 AM Bea Graff, NT wrote: Reason for CRM: Elberta Fortis with the [facility] calling to check status on the MRI scheduled for tomorrow for this pt getting auth? CB#: [phone].

## 2018-08-07 ENCOUNTER — Ambulatory Visit: Admission: RE | Admit: 2018-08-07 | Payer: Medicare HMO | Source: Ambulatory Visit

## 2018-08-25 ENCOUNTER — Ambulatory Visit
Admission: RE | Admit: 2018-08-25 | Discharge: 2018-08-25 | Disposition: A | Discharge status: Home / Self Care | Payer: Medicare HMO | Source: Ambulatory Visit | Attending: Internal Medicine | Admitting: Internal Medicine

## 2018-08-25 DIAGNOSIS — M5136 Other intervertebral disc degeneration, lumbar region: Secondary | ICD-10-CM | POA: Diagnosis not present

## 2018-08-25 DIAGNOSIS — M419 Scoliosis, unspecified: Secondary | ICD-10-CM | POA: Insufficient documentation

## 2018-08-25 DIAGNOSIS — M5416 Radiculopathy, lumbar region: Secondary | ICD-10-CM | POA: Diagnosis not present

## 2018-08-25 DIAGNOSIS — M545 Low back pain: Secondary | ICD-10-CM | POA: Diagnosis not present

## 2018-08-25 DIAGNOSIS — W19XXXD Unspecified fall, subsequent encounter: Secondary | ICD-10-CM | POA: Insufficient documentation

## 2018-08-28 ENCOUNTER — Other Ambulatory Visit: Payer: Self-pay | Admitting: Internal Medicine

## 2018-08-28 DIAGNOSIS — G8929 Other chronic pain: Secondary | ICD-10-CM

## 2018-08-28 DIAGNOSIS — R937 Abnormal findings on diagnostic imaging of other parts of musculoskeletal system: Secondary | ICD-10-CM

## 2018-08-28 DIAGNOSIS — M545 Low back pain: Secondary | ICD-10-CM

## 2018-09-19 DIAGNOSIS — M5136 Other intervertebral disc degeneration, lumbar region: Secondary | ICD-10-CM | POA: Diagnosis not present

## 2018-09-19 DIAGNOSIS — M79605 Pain in left leg: Secondary | ICD-10-CM | POA: Diagnosis not present

## 2018-09-19 DIAGNOSIS — Z7409 Other reduced mobility: Secondary | ICD-10-CM | POA: Diagnosis not present

## 2018-09-19 DIAGNOSIS — Z789 Other specified health status: Secondary | ICD-10-CM | POA: Diagnosis not present

## 2018-09-23 ENCOUNTER — Ambulatory Visit: Payer: Medicare Other

## 2018-09-25 ENCOUNTER — Telehealth (INDEPENDENT_AMBULATORY_CARE_PROVIDER_SITE_OTHER): Payer: Self-pay

## 2018-09-26 ENCOUNTER — Telehealth (INDEPENDENT_AMBULATORY_CARE_PROVIDER_SITE_OTHER): Payer: Self-pay | Admitting: Vascular Surgery

## 2018-09-29 ENCOUNTER — Other Ambulatory Visit: Payer: Self-pay

## 2018-09-29 ENCOUNTER — Ambulatory Visit (INDEPENDENT_AMBULATORY_CARE_PROVIDER_SITE_OTHER): Payer: Medicare HMO | Admitting: Vascular Surgery

## 2018-09-29 ENCOUNTER — Ambulatory Visit (INDEPENDENT_AMBULATORY_CARE_PROVIDER_SITE_OTHER): Payer: Medicare HMO

## 2018-09-29 ENCOUNTER — Other Ambulatory Visit (INDEPENDENT_AMBULATORY_CARE_PROVIDER_SITE_OTHER): Payer: Self-pay | Admitting: Vascular Surgery

## 2018-09-29 ENCOUNTER — Encounter (INDEPENDENT_AMBULATORY_CARE_PROVIDER_SITE_OTHER): Payer: Self-pay | Admitting: Vascular Surgery

## 2018-09-29 VITALS — BP 99/61 | HR 66 | Resp 15

## 2018-09-29 DIAGNOSIS — Z79899 Other long term (current) drug therapy: Secondary | ICD-10-CM

## 2018-09-29 DIAGNOSIS — L97921 Non-pressure chronic ulcer of unspecified part of left lower leg limited to breakdown of skin: Secondary | ICD-10-CM

## 2018-09-29 DIAGNOSIS — F17210 Nicotine dependence, cigarettes, uncomplicated: Secondary | ICD-10-CM

## 2018-09-29 DIAGNOSIS — E119 Type 2 diabetes mellitus without complications: Secondary | ICD-10-CM | POA: Diagnosis not present

## 2018-09-29 DIAGNOSIS — Z9862 Peripheral vascular angioplasty status: Secondary | ICD-10-CM

## 2018-09-29 DIAGNOSIS — I739 Peripheral vascular disease, unspecified: Secondary | ICD-10-CM

## 2018-09-29 DIAGNOSIS — E785 Hyperlipidemia, unspecified: Secondary | ICD-10-CM | POA: Diagnosis not present

## 2018-09-29 DIAGNOSIS — I70222 Atherosclerosis of native arteries of extremities with rest pain, left leg: Secondary | ICD-10-CM

## 2018-09-29 DIAGNOSIS — I70249 Atherosclerosis of native arteries of left leg with ulceration of unspecified site: Secondary | ICD-10-CM | POA: Diagnosis not present

## 2018-09-29 DIAGNOSIS — I10 Essential (primary) hypertension: Secondary | ICD-10-CM

## 2018-09-29 NOTE — Progress Notes (Signed)
MRN : [id number]  Yesenia Shepard is a 68 y.o. (04-Shyanna-1952) female who presents with chief complaint of  Chief Complaint  Patient presents with  . Follow-up    ultrasound follow up  .  History of Present Illness:   The patient returns to the office for followup and review status post angiogram with intervention on 12/17/2017.              1. Introduction catheter intoleftlower extremity 3rd order catheter placement  2.Contrast injectionleftlower extremity for distal runoff  3. Percutaneous transluminal angioplastyleftsuperficial femoral arteryto 5 mm with a Lutonix drug-eluting balloon 4. Percutaneous transluminal angioplasty left external iliac artery to 8 mm with Lutonix drug-eluting balloon 5.Star close closurerightcommon femoral arteriotomy  The patient notes minimal improvement in the lower extremity symptoms with little change in her claudication distance and her rest pain symptoms are even worse. No new ulcers or wounds have occurred since the last visit.  She is having increased swelling of her left foot.  She has also experienced increased swelling and has developed an ulcer of the left shin.  The patient denies amaurosis fugax or recent TIA symptoms. There are no recent neurological changes noted. The patient denies history of DVT, PE or superficial thrombophlebitis. The patient denies recent episodes of angina or shortness of breath.   ABI's Rt=0.93 and Lt=0.63  (previous ABI's Rt=1.04 and Lt=0.96)   Current Meds  Medication Sig  . ARIPiprazole (ABILIFY) 10 MG tablet Take 10 mg by mouth daily.  Marland Kitchen aspirin 81 MG tablet Take 81 mg by mouth daily.  Marland Kitchen atorvastatin (LIPITOR) 40 MG tablet Take 1 tablet (40 mg total) by mouth daily.  . citalopram (CELEXA) 20 MG tablet Take 20 mg by mouth daily.  . clonazePAM (KLONOPIN) 2 MG tablet Take 2 mg by mouth daily as needed for anxiety.   . clopidogrel (PLAVIX) 75 MG  tablet TAKE 1 TABLET BY MOUTH ONCE DAILY  . docusate sodium (COLACE) 100 MG capsule Take 200 mg by mouth 2 (two) times daily.   Marland Kitchen escitalopram (LEXAPRO) 20 MG tablet Take 20 mg by mouth daily.   . folic acid (FOLVITE) 1 MG tablet Take 2 mg by mouth 2 (two) times daily.  . furosemide (LASIX) 20 MG tablet Take 1 tablet (20 mg total) by mouth daily. In am  . ibuprofen (ADVIL,MOTRIN) 200 MG tablet Take 600 mg by mouth daily as needed for mild pain or moderate pain.   Marland Kitchen lisinopril (PRINIVIL,ZESTRIL) 10 MG tablet Take 1 tablet (10 mg total) by mouth daily.  . methadone (DOLOPHINE) 10 MG tablet Take 110 mg by mouth daily.   . polyethylene glycol (MIRALAX / GLYCOLAX) packet Take 17 g by mouth daily as needed for mild constipation.  . traZODone (DESYREL) 100 MG tablet Take 200 mg by mouth at bedtime.     Past Medical History:  Diagnosis Date  . Anxiety   . Arthritis   . Chronic pain   . Depression   . Drug abuse ([place])    History of polysubstance abuse; currently on methadone.  . Hepatitis    History of Hep "C". treated and cured with Harvoni  . History of chicken pox   . Hypertension   . Panic attacks   . Peripheral vascular disease ([place])    stent in place.   . Pre-diabetes   . UTI (urinary tract infection)    History of    Past Surgical History:  Procedure Laterality Date  . ABDOMINAL HYSTERECTOMY  [place]  . INTRACAPSULAR CATARACT EXTRACTION Left   . LOWER EXTREMITY ANGIOGRAPHY Left 12/17/2017   Procedure: LOWER EXTREMITY ANGIOGRAPHY;  Surgeon: Katha Cabal, MD;  Location: [facility];  Service: Cardiovascular;  Laterality: Left;  . PERIPHERAL VASCULAR CATHETERIZATION Left 05/04/2015   Procedure: Lower Extremity Angiography;  Surgeon: Katha Cabal, MD;  Location: [place] [facility];  Service: Cardiovascular;  Laterality: Left;  . PERIPHERAL VASCULAR CATHETERIZATION Left 05/04/2015   Procedure: Lower Extremity Intervention;  Surgeon:  Katha Cabal, MD;  Location: [facility];  Service: Cardiovascular;  Laterality: Left;  . SHOULDER ARTHROSCOPY WITH OPEN ROTATOR CUFF REPAIR Right 01/16/2017   Procedure: SHOULDER ARTHROSCOPY WITH OPEN ROTATOR CUFF REPAIR;  Surgeon: Thornton Park, MD;  Location: [facility];  Service: Orthopedics;  Laterality: Right;  . TONSILLECTOMY AND ADENOIDECTOMY  1971    Social History Social History   Tobacco Use  . Smoking status: Current Every Day Smoker    Packs/day: 0.50    Years: 30.00    Pack years: 15.00    Types: Cigarettes  . Smokeless tobacco: Never Used  . Tobacco comment: Currently smoking 1/2 ppd  Substance Use Topics  . Alcohol use: No    Alcohol/week: 0.0 standard drinks  . Drug use: No    Comment: Hx of.    Family History Family History  Problem Relation Age of Onset  . Arthritis Mother   . Mental illness Mother        depression  . Diabetes Mother   . Cancer Mother        pancreatic  . Cancer Father        colon  . Heart disease Father   . Diabetes Father   . Cancer Daughter        lung    No Known Allergies   REVIEW OF SYSTEMS (Negative unless checked)  Constitutional: [] Weight loss  [] Fever  [] Chills Cardiac: [] Chest pain   [] Chest pressure   [] Palpitations   [] Shortness of breath when laying flat   [x] Shortness of breath with exertion. Vascular:  [x] Pain in legs with walking   [x] Pain in legs at rest  [] History of DVT   [] Phlebitis   [x] Swelling in legs   [] Varicose veins   [x] Non-healing ulcers Pulmonary:   [] Uses home oxygen   [] Productive cough   [] Hemoptysis   [] Wheeze  [] COPD   [] Asthma Neurologic:  [] Dizziness   [] Seizures   [] History of stroke   [] History of TIA  [] Aphasia   [] Vissual changes   [] Weakness or numbness in arm   [] Weakness or numbness in leg Musculoskeletal:   [] Joint swelling   [x] Joint pain   [] Low back pain Hematologic:  [] Easy bruising  [] Easy bleeding   [] Hypercoagulable state   [] Anemic Gastrointestinal:   [] Diarrhea   [] Vomiting  [x] Gastroesophageal reflux/heartburn   [] Difficulty swallowing. Genitourinary:  [] Chronic kidney disease   [] Difficult urination  [] Frequent urination   [] Blood in urine Skin:  [] Rashes   [] Ulcers  Psychological:  [] History of anxiety   []  History of major depression.  Physical Examination  Vitals:   09/29/18 1534  BP: 99/61  Pulse: 66  Resp: 15   There is no height or weight on file to calculate BMI. Gen: WD/WN, NAD Head: [place]/AT, No temporalis wasting.  Ear/Nose/Throat: Hearing grossly intact, nares w/o erythema or drainage Eyes: PER, EOMI, sclera nonicteric.  Neck: Supple, no large masses.   Pulmonary:  Good air movement, no audible wheezing  bilaterally, no use of accessory muscles.  Cardiac: RRR, no JVD Vascular: left leg cool with sluggish cap refill, 2-3+ edema of the left leg with severe venous changes of the left leg.  Venous ulcer noted in the ankle area on the left, noninfected Vessel Right Left  Radial Palpable Palpable  PT Trace Palpable Not Palpable  DP Trace Palpable notPalpable  Gastrointestinal: Non-distended. No guarding/no peritoneal signs.  Musculoskeletal: M/S 5/5 throughout.  No deformity or atrophy.  Neurologic: CN 2-12 intact. Symmetrical.  Speech is fluent. Motor exam as listed above. Psychiatric: Judgment intact, Mood & affect appropriate for pt's clinical situation. Dermatologic: Venous stasis dermatitis with ulcers present on the left.  No changes consistent with cellulitis. Lymph : No lichenification or skin changes of chronic lymphedema.  CBC Lab Results  Component Value Date   WBC 6.6 04/10/2018   HGB 12.2 04/10/2018   HCT 37.1 04/10/2018   MCV 90.9 04/10/2018   PLT 311.0 04/10/2018    BMET    Component Value Date/Time   NA 138 04/10/2018 1428   NA 139 02/04/2015 1305   K 4.3 04/10/2018 1428   CL 101 04/10/2018 1428   CO2 35 (H) 04/10/2018 1428   GLUCOSE 226 (H) 04/10/2018 1428   BUN 11 04/10/2018 1428   BUN 10  02/04/2015 1305   CREATININE 0.78 04/10/2018 1428   CREATININE 0.76 01/05/2016 1524   CALCIUM 9.0 04/10/2018 1428   GFRNONAA >60 12/17/2017 1024   GFRNONAA 83 01/05/2016 1524   GFRAA >60 12/17/2017 1024   GFRAA >89 01/05/2016 1524   CrCl cannot be calculated (Patient's most recent lab result is older than the maximum 21 days allowed.).  COAG Lab Results  Component Value Date   INR 1.06 01/10/2017   INR 1.17 10/18/2015    Radiology No results found.   Assessment/Plan 1. Atherosclerosis of native artery of left lower extremity with rest pain (HCC) Recommend:  The patient has evidence of severe atherosclerotic changes of both lower extremities with rest pain that is associated with preulcerative changes and impending tissue loss of the right foot.  This represents a limb threatening ischemia and places the patient at the risk for limb loss.  Patient should undergo angiography of the right lower extremities with the hope for intervention for limb salvage.  The risks and benefits as well as the alternative therapies was discussed in detail with the patient.  All questions were answered.  Patient agrees to proceed with right leg angiography.  The patient will follow up with me in the office after the procedure.   2. Ulcer of left lower leg, limited to breakdown of skin ([place]) No surgery or intervention at this point in time.    I have had a long discussion with the patient regarding venous insufficiency and why it  causes symptoms, specifically venous ulceration . I have discussed with the patient the chronic skin changes that accompany venous insufficiency and the long term sequela such as infection and recurring  ulceration.  Patient will be placed in [employer] which will be changed weekly drainage permitting.  In addition, behavioral modification including several periods of elevation of the lower extremities during the day will be continued. Achieving a position with the ankles at  heart level was stressed to the patient  The patient is instructed to begin routine exercise, especially walking on a daily basis  Following the review of the ultrasound the patient will follow up in one week to reassess the degree of swelling  and the control that [place] therapy is offering.   The patient can be assessed for graduated compression stockings or wraps as well as a Lymph Pump once the ulcers are healed.   3. Essential hypertension Continue antihypertensive medications as already ordered, these medications have been reviewed and there are no changes at this time.   4. Type 2 diabetes mellitus without complication, without long-term current use of insulin (HCC) Continue hypoglycemic medications as already ordered, these medications have been reviewed and there are no changes at this time.  Hgb A1C to be monitored as already arranged by primary service   5. Hyperlipidemia, unspecified hyperlipidemia type Continue statin as ordered and reviewed, no changes at this time     Hortencia Pilar, MD  09/29/2018 3:42 PM

## 2018-10-01 ENCOUNTER — Encounter (INDEPENDENT_AMBULATORY_CARE_PROVIDER_SITE_OTHER): Payer: Self-pay | Admitting: Vascular Surgery

## 2018-10-01 ENCOUNTER — Encounter (INDEPENDENT_AMBULATORY_CARE_PROVIDER_SITE_OTHER): Payer: Self-pay

## 2018-10-01 ENCOUNTER — Telehealth (INDEPENDENT_AMBULATORY_CARE_PROVIDER_SITE_OTHER): Payer: Self-pay

## 2018-10-01 DIAGNOSIS — I70229 Atherosclerosis of native arteries of extremities with rest pain, unspecified extremity: Secondary | ICD-10-CM | POA: Insufficient documentation

## 2018-10-01 NOTE — Telephone Encounter (Signed)
I called earlier this morning and left a message for the patient to return a call to me regarding her visit. Patient called back and seemed confused as to what she needed to be scheduled for. I advised that the patient speak with family and call back if going forward with the angiogram. The patient is as of now scheduled for a left leg angiogram with Dr. Delana Meyer on 10/07/2018 with an 8:00 am arrival time.

## 2018-10-06 ENCOUNTER — Other Ambulatory Visit (INDEPENDENT_AMBULATORY_CARE_PROVIDER_SITE_OTHER): Payer: Self-pay | Admitting: Nurse Practitioner

## 2018-10-06 ENCOUNTER — Ambulatory Visit (INDEPENDENT_AMBULATORY_CARE_PROVIDER_SITE_OTHER): Payer: Medicare HMO | Admitting: Vascular Surgery

## 2018-10-06 DIAGNOSIS — E1151 Type 2 diabetes mellitus with diabetic peripheral angiopathy without gangrene: Secondary | ICD-10-CM | POA: Diagnosis not present

## 2018-10-06 DIAGNOSIS — F17210 Nicotine dependence, cigarettes, uncomplicated: Secondary | ICD-10-CM | POA: Diagnosis not present

## 2018-10-06 DIAGNOSIS — I70223 Atherosclerosis of native arteries of extremities with rest pain, bilateral legs: Secondary | ICD-10-CM | POA: Diagnosis not present

## 2018-10-06 DIAGNOSIS — M5442 Lumbago with sciatica, left side: Secondary | ICD-10-CM | POA: Diagnosis not present

## 2018-10-06 DIAGNOSIS — L97821 Non-pressure chronic ulcer of other part of left lower leg limited to breakdown of skin: Secondary | ICD-10-CM | POA: Diagnosis not present

## 2018-10-06 DIAGNOSIS — L89890 Pressure ulcer of other site, unstageable: Secondary | ICD-10-CM | POA: Diagnosis not present

## 2018-10-06 DIAGNOSIS — I10 Essential (primary) hypertension: Secondary | ICD-10-CM | POA: Diagnosis not present

## 2018-10-06 DIAGNOSIS — I872 Venous insufficiency (chronic) (peripheral): Secondary | ICD-10-CM | POA: Diagnosis not present

## 2018-10-06 DIAGNOSIS — M5441 Lumbago with sciatica, right side: Secondary | ICD-10-CM | POA: Diagnosis not present

## 2018-10-06 MED ORDER — CEFAZOLIN SODIUM-DEXTROSE 2-4 GM/100ML-% IV SOLN
2.0000 g | Freq: Once | INTRAVENOUS | Status: DC
  Filled 2018-10-06: qty 100

## 2018-10-07 ENCOUNTER — Ambulatory Visit: Admission: RE | Admit: 2018-10-07 | Payer: Medicare HMO | Source: Home / Self Care | Admitting: Vascular Surgery

## 2018-10-07 ENCOUNTER — Telehealth (INDEPENDENT_AMBULATORY_CARE_PROVIDER_SITE_OTHER): Payer: Self-pay

## 2018-10-07 SURGERY — LOWER EXTREMITY ANGIOGRAPHY
Anesthesia: Moderate Sedation | Site: Leg Lower | Laterality: Left

## 2018-10-07 NOTE — Telephone Encounter (Signed)
I spoke with Yesenia Shepard and she is aware

## 2018-10-07 NOTE — Telephone Encounter (Signed)
That is fine 

## 2018-10-14 DIAGNOSIS — M5442 Lumbago with sciatica, left side: Secondary | ICD-10-CM | POA: Diagnosis not present

## 2018-10-14 DIAGNOSIS — I70223 Atherosclerosis of native arteries of extremities with rest pain, bilateral legs: Secondary | ICD-10-CM | POA: Diagnosis not present

## 2018-10-14 DIAGNOSIS — I872 Venous insufficiency (chronic) (peripheral): Secondary | ICD-10-CM | POA: Diagnosis not present

## 2018-10-14 DIAGNOSIS — L89890 Pressure ulcer of other site, unstageable: Secondary | ICD-10-CM | POA: Diagnosis not present

## 2018-10-14 DIAGNOSIS — E1151 Type 2 diabetes mellitus with diabetic peripheral angiopathy without gangrene: Secondary | ICD-10-CM | POA: Diagnosis not present

## 2018-10-14 DIAGNOSIS — I10 Essential (primary) hypertension: Secondary | ICD-10-CM | POA: Diagnosis not present

## 2018-10-14 DIAGNOSIS — L97821 Non-pressure chronic ulcer of other part of left lower leg limited to breakdown of skin: Secondary | ICD-10-CM | POA: Diagnosis not present

## 2018-10-14 DIAGNOSIS — M5441 Lumbago with sciatica, right side: Secondary | ICD-10-CM | POA: Diagnosis not present

## 2018-10-14 DIAGNOSIS — F17210 Nicotine dependence, cigarettes, uncomplicated: Secondary | ICD-10-CM | POA: Diagnosis not present

## 2018-10-20 ENCOUNTER — Telehealth (INDEPENDENT_AMBULATORY_CARE_PROVIDER_SITE_OTHER): Payer: Self-pay

## 2018-10-20 ENCOUNTER — Telehealth: Payer: Self-pay

## 2018-10-20 NOTE — Telephone Encounter (Signed)
Copied from [place] [id number]. Topic: General - Other >> Oct 20, 2018  2:09 PM Valla Leaver wrote: Reason for CRM: Patient needs a call back from [place] to discuss diabetes.

## 2018-10-21 ENCOUNTER — Other Ambulatory Visit (INDEPENDENT_AMBULATORY_CARE_PROVIDER_SITE_OTHER): Payer: Self-pay | Admitting: Nurse Practitioner

## 2018-10-21 DIAGNOSIS — I10 Essential (primary) hypertension: Secondary | ICD-10-CM | POA: Diagnosis not present

## 2018-10-21 DIAGNOSIS — L89890 Pressure ulcer of other site, unstageable: Secondary | ICD-10-CM | POA: Diagnosis not present

## 2018-10-21 DIAGNOSIS — M5441 Lumbago with sciatica, right side: Secondary | ICD-10-CM | POA: Diagnosis not present

## 2018-10-21 DIAGNOSIS — I872 Venous insufficiency (chronic) (peripheral): Secondary | ICD-10-CM | POA: Diagnosis not present

## 2018-10-21 DIAGNOSIS — F17210 Nicotine dependence, cigarettes, uncomplicated: Secondary | ICD-10-CM | POA: Diagnosis not present

## 2018-10-21 DIAGNOSIS — L97821 Non-pressure chronic ulcer of other part of left lower leg limited to breakdown of skin: Secondary | ICD-10-CM | POA: Diagnosis not present

## 2018-10-21 DIAGNOSIS — M5442 Lumbago with sciatica, left side: Secondary | ICD-10-CM | POA: Diagnosis not present

## 2018-10-21 DIAGNOSIS — E1151 Type 2 diabetes mellitus with diabetic peripheral angiopathy without gangrene: Secondary | ICD-10-CM | POA: Diagnosis not present

## 2018-10-21 DIAGNOSIS — I70223 Atherosclerosis of native arteries of extremities with rest pain, bilateral legs: Secondary | ICD-10-CM | POA: Diagnosis not present

## 2018-10-21 MED ORDER — CEPHALEXIN 500 MG PO CAPS: 500.0000 mg | ORAL_CAPSULE | Freq: Three times a day (TID) | ORAL | 0 refills | Status: DC

## 2018-10-21 NOTE — Telephone Encounter (Signed)
Pt returning Brock's call.

## 2018-10-21 NOTE — Telephone Encounter (Signed)
Patient is schedule to come in the office 10/23/2018 with Dr Delana Meyer

## 2018-10-21 NOTE — Telephone Encounter (Signed)
The wounds are consistent with what the patient had about two weeks ago.  It is also likely that there is some pain given the nature of the vascular disease.  The patient was scheduled for an angiogram on 3/31 that was cancelled per the patient.  In order for the wound to heal and to fix the cause of the pain that she is having, she would need to undergo angiogram.

## 2018-10-21 NOTE — Telephone Encounter (Signed)
Left message for patient to return call back.  

## 2018-10-21 NOTE — Telephone Encounter (Signed)
Misty RN from [facility] called stating that the patient left foot looks infected,have a bad smell,bright red,warm,no fever,heart rate 112 and b/p 96/60 please advise

## 2018-10-21 NOTE — Telephone Encounter (Signed)
We will send in some antibiotics, please confirm her pharmacy.  I have sent them to the pharmacy in [place] on [address].  She also needs to be put on the schedule to see Dr. Delana Meyer on Thursday.

## 2018-10-22 NOTE — Telephone Encounter (Signed)
Left message with a man for patient to return call back.

## 2018-10-23 ENCOUNTER — Ambulatory Visit (INDEPENDENT_AMBULATORY_CARE_PROVIDER_SITE_OTHER): Payer: Medicare HMO | Admitting: Vascular Surgery

## 2018-10-23 ENCOUNTER — Telehealth (INDEPENDENT_AMBULATORY_CARE_PROVIDER_SITE_OTHER): Payer: Self-pay

## 2018-10-23 NOTE — Telephone Encounter (Signed)
Misty from [facility] had called requesting social work,occupational therapy,physical therapy,homehealth aide and I spoke with Dr Delana Meyer and he agree with orders requested

## 2018-10-24 ENCOUNTER — Inpatient Hospital Stay
Admission: AD | Admit: 2018-10-24 | Discharge: 2018-11-04 | DRG: 239 | Disposition: A | Discharge status: Skilled Nursing Facility | Payer: Medicare HMO | Attending: Vascular Surgery | Admitting: Vascular Surgery

## 2018-10-24 ENCOUNTER — Ambulatory Visit (INDEPENDENT_AMBULATORY_CARE_PROVIDER_SITE_OTHER): Payer: Medicare HMO | Admitting: Nurse Practitioner

## 2018-10-24 ENCOUNTER — Encounter
Admission: AD | Disposition: A | Discharge status: Skilled Nursing Facility | Payer: Self-pay | Source: Home / Self Care | Attending: Vascular Surgery

## 2018-10-24 ENCOUNTER — Encounter (INDEPENDENT_AMBULATORY_CARE_PROVIDER_SITE_OTHER): Payer: Self-pay | Admitting: Nurse Practitioner

## 2018-10-24 ENCOUNTER — Other Ambulatory Visit (INDEPENDENT_AMBULATORY_CARE_PROVIDER_SITE_OTHER): Payer: Self-pay | Admitting: Nurse Practitioner

## 2018-10-24 ENCOUNTER — Encounter: Payer: Self-pay | Admitting: *Deleted

## 2018-10-24 ENCOUNTER — Other Ambulatory Visit: Payer: Self-pay

## 2018-10-24 ENCOUNTER — Telehealth (INDEPENDENT_AMBULATORY_CARE_PROVIDER_SITE_OTHER): Payer: Self-pay

## 2018-10-24 ENCOUNTER — Other Ambulatory Visit (INDEPENDENT_AMBULATORY_CARE_PROVIDER_SITE_OTHER): Payer: Self-pay | Admitting: Vascular Surgery

## 2018-10-24 ENCOUNTER — Inpatient Hospital Stay: Payer: Medicare HMO

## 2018-10-24 VITALS — BP 106/69 | HR 83 | Resp 15

## 2018-10-24 DIAGNOSIS — L97529 Non-pressure chronic ulcer of other part of left foot with unspecified severity: Secondary | ICD-10-CM | POA: Diagnosis not present

## 2018-10-24 DIAGNOSIS — N179 Acute kidney failure, unspecified: Secondary | ICD-10-CM | POA: Diagnosis not present

## 2018-10-24 DIAGNOSIS — Z7902 Long term (current) use of antithrombotics/antiplatelets: Secondary | ICD-10-CM | POA: Diagnosis not present

## 2018-10-24 DIAGNOSIS — E1169 Type 2 diabetes mellitus with other specified complication: Secondary | ICD-10-CM | POA: Diagnosis present

## 2018-10-24 DIAGNOSIS — E11628 Type 2 diabetes mellitus with other skin complications: Secondary | ICD-10-CM | POA: Diagnosis not present

## 2018-10-24 DIAGNOSIS — I70244 Atherosclerosis of native arteries of left leg with ulceration of heel and midfoot: Secondary | ICD-10-CM | POA: Diagnosis present

## 2018-10-24 DIAGNOSIS — M159 Polyosteoarthritis, unspecified: Secondary | ICD-10-CM

## 2018-10-24 DIAGNOSIS — F410 Panic disorder [episodic paroxysmal anxiety] without agoraphobia: Secondary | ICD-10-CM | POA: Diagnosis present

## 2018-10-24 DIAGNOSIS — Z818 Family history of other mental and behavioral disorders: Secondary | ICD-10-CM

## 2018-10-24 DIAGNOSIS — E876 Hypokalemia: Secondary | ICD-10-CM | POA: Diagnosis not present

## 2018-10-24 DIAGNOSIS — T368X5A Adverse effect of other systemic antibiotics, initial encounter: Secondary | ICD-10-CM | POA: Diagnosis not present

## 2018-10-24 DIAGNOSIS — E1152 Type 2 diabetes mellitus with diabetic peripheral angiopathy with gangrene: Secondary | ICD-10-CM | POA: Diagnosis present

## 2018-10-24 DIAGNOSIS — L089 Local infection of the skin and subcutaneous tissue, unspecified: Secondary | ICD-10-CM | POA: Diagnosis not present

## 2018-10-24 DIAGNOSIS — Z833 Family history of diabetes mellitus: Secondary | ICD-10-CM

## 2018-10-24 DIAGNOSIS — Z1159 Encounter for screening for other viral diseases: Secondary | ICD-10-CM

## 2018-10-24 DIAGNOSIS — Z8249 Family history of ischemic heart disease and other diseases of the circulatory system: Secondary | ICD-10-CM

## 2018-10-24 DIAGNOSIS — E119 Type 2 diabetes mellitus without complications: Secondary | ICD-10-CM

## 2018-10-24 DIAGNOSIS — F1190 Opioid use, unspecified, uncomplicated: Secondary | ICD-10-CM | POA: Diagnosis present

## 2018-10-24 DIAGNOSIS — I739 Peripheral vascular disease, unspecified: Secondary | ICD-10-CM | POA: Diagnosis not present

## 2018-10-24 DIAGNOSIS — N390 Urinary tract infection, site not specified: Secondary | ICD-10-CM | POA: Diagnosis not present

## 2018-10-24 DIAGNOSIS — E11621 Type 2 diabetes mellitus with foot ulcer: Secondary | ICD-10-CM | POA: Diagnosis not present

## 2018-10-24 DIAGNOSIS — L97421 Non-pressure chronic ulcer of left heel and midfoot limited to breakdown of skin: Secondary | ICD-10-CM | POA: Diagnosis not present

## 2018-10-24 DIAGNOSIS — F17201 Nicotine dependence, unspecified, in remission: Secondary | ICD-10-CM | POA: Diagnosis not present

## 2018-10-24 DIAGNOSIS — E785 Hyperlipidemia, unspecified: Secondary | ICD-10-CM | POA: Diagnosis not present

## 2018-10-24 DIAGNOSIS — Z8619 Personal history of other infectious and parasitic diseases: Secondary | ICD-10-CM

## 2018-10-24 DIAGNOSIS — E1151 Type 2 diabetes mellitus with diabetic peripheral angiopathy without gangrene: Secondary | ICD-10-CM | POA: Diagnosis not present

## 2018-10-24 DIAGNOSIS — Z4781 Encounter for orthopedic aftercare following surgical amputation: Secondary | ICD-10-CM | POA: Diagnosis not present

## 2018-10-24 DIAGNOSIS — M2550 Pain in unspecified joint: Secondary | ICD-10-CM | POA: Diagnosis not present

## 2018-10-24 DIAGNOSIS — Z8261 Family history of arthritis: Secondary | ICD-10-CM

## 2018-10-24 DIAGNOSIS — F418 Other specified anxiety disorders: Secondary | ICD-10-CM | POA: Diagnosis not present

## 2018-10-24 DIAGNOSIS — Z515 Encounter for palliative care: Secondary | ICD-10-CM | POA: Diagnosis not present

## 2018-10-24 DIAGNOSIS — R5381 Other malaise: Secondary | ICD-10-CM | POA: Diagnosis not present

## 2018-10-24 DIAGNOSIS — Z801 Family history of malignant neoplasm of trachea, bronchus and lung: Secondary | ICD-10-CM | POA: Diagnosis not present

## 2018-10-24 DIAGNOSIS — I70209 Unspecified atherosclerosis of native arteries of extremities, unspecified extremity: Secondary | ICD-10-CM | POA: Insufficient documentation

## 2018-10-24 DIAGNOSIS — F419 Anxiety disorder, unspecified: Secondary | ICD-10-CM

## 2018-10-24 DIAGNOSIS — Z791 Long term (current) use of non-steroidal anti-inflammatories (NSAID): Secondary | ICD-10-CM | POA: Diagnosis not present

## 2018-10-24 DIAGNOSIS — G8929 Other chronic pain: Secondary | ICD-10-CM | POA: Diagnosis present

## 2018-10-24 DIAGNOSIS — M150 Primary generalized (osteo)arthritis: Secondary | ICD-10-CM | POA: Diagnosis not present

## 2018-10-24 DIAGNOSIS — M86172 Other acute osteomyelitis, left ankle and foot: Secondary | ICD-10-CM | POA: Diagnosis not present

## 2018-10-24 DIAGNOSIS — L02612 Cutaneous abscess of left foot: Secondary | ICD-10-CM | POA: Diagnosis present

## 2018-10-24 DIAGNOSIS — Z960 Presence of urogenital implants: Secondary | ICD-10-CM | POA: Diagnosis not present

## 2018-10-24 DIAGNOSIS — R339 Retention of urine, unspecified: Secondary | ICD-10-CM | POA: Diagnosis not present

## 2018-10-24 DIAGNOSIS — Z800 Family history of malignant neoplasm of digestive organs: Secondary | ICD-10-CM

## 2018-10-24 DIAGNOSIS — F17210 Nicotine dependence, cigarettes, uncomplicated: Secondary | ICD-10-CM | POA: Diagnosis present

## 2018-10-24 DIAGNOSIS — F329 Major depressive disorder, single episode, unspecified: Secondary | ICD-10-CM | POA: Diagnosis present

## 2018-10-24 DIAGNOSIS — M6281 Muscle weakness (generalized): Secondary | ICD-10-CM | POA: Diagnosis not present

## 2018-10-24 DIAGNOSIS — I10 Essential (primary) hypertension: Secondary | ICD-10-CM | POA: Diagnosis not present

## 2018-10-24 DIAGNOSIS — R600 Localized edema: Secondary | ICD-10-CM | POA: Diagnosis not present

## 2018-10-24 DIAGNOSIS — Z95820 Peripheral vascular angioplasty status with implants and grafts: Secondary | ICD-10-CM | POA: Diagnosis not present

## 2018-10-24 DIAGNOSIS — L98499 Non-pressure chronic ulcer of skin of other sites with unspecified severity: Secondary | ICD-10-CM

## 2018-10-24 DIAGNOSIS — R262 Difficulty in walking, not elsewhere classified: Secondary | ICD-10-CM | POA: Diagnosis not present

## 2018-10-24 DIAGNOSIS — M8949 Other hypertrophic osteoarthropathy, multiple sites: Secondary | ICD-10-CM

## 2018-10-24 DIAGNOSIS — I96 Gangrene, not elsewhere classified: Secondary | ICD-10-CM | POA: Diagnosis present

## 2018-10-24 DIAGNOSIS — Z7189 Other specified counseling: Secondary | ICD-10-CM | POA: Diagnosis not present

## 2018-10-24 DIAGNOSIS — Z89422 Acquired absence of other left toe(s): Secondary | ICD-10-CM | POA: Diagnosis not present

## 2018-10-24 DIAGNOSIS — L97528 Non-pressure chronic ulcer of other part of left foot with other specified severity: Secondary | ICD-10-CM | POA: Diagnosis not present

## 2018-10-24 DIAGNOSIS — N170 Acute kidney failure with tubular necrosis: Secondary | ICD-10-CM | POA: Diagnosis not present

## 2018-10-24 DIAGNOSIS — F17200 Nicotine dependence, unspecified, uncomplicated: Secondary | ICD-10-CM

## 2018-10-24 DIAGNOSIS — Z9889 Other specified postprocedural states: Secondary | ICD-10-CM | POA: Diagnosis not present

## 2018-10-24 DIAGNOSIS — Z7982 Long term (current) use of aspirin: Secondary | ICD-10-CM

## 2018-10-24 DIAGNOSIS — M79605 Pain in left leg: Secondary | ICD-10-CM | POA: Diagnosis present

## 2018-10-24 DIAGNOSIS — I70245 Atherosclerosis of native arteries of left leg with ulceration of other part of foot: Secondary | ICD-10-CM

## 2018-10-24 DIAGNOSIS — Z7401 Bed confinement status: Secondary | ICD-10-CM | POA: Diagnosis not present

## 2018-10-24 DIAGNOSIS — B952 Enterococcus as the cause of diseases classified elsewhere: Secondary | ICD-10-CM | POA: Diagnosis not present

## 2018-10-24 DIAGNOSIS — L03116 Cellulitis of left lower limb: Secondary | ICD-10-CM | POA: Diagnosis present

## 2018-10-24 DIAGNOSIS — M869 Osteomyelitis, unspecified: Secondary | ICD-10-CM | POA: Diagnosis not present

## 2018-10-24 DIAGNOSIS — F1123 Opioid dependence with withdrawal: Secondary | ICD-10-CM | POA: Diagnosis not present

## 2018-10-24 DIAGNOSIS — M86171 Other acute osteomyelitis, right ankle and foot: Secondary | ICD-10-CM | POA: Diagnosis not present

## 2018-10-24 DIAGNOSIS — Z79899 Other long term (current) drug therapy: Secondary | ICD-10-CM | POA: Diagnosis not present

## 2018-10-24 DIAGNOSIS — F32A Depression, unspecified: Secondary | ICD-10-CM | POA: Diagnosis present

## 2018-10-24 DIAGNOSIS — I70262 Atherosclerosis of native arteries of extremities with gangrene, left leg: Secondary | ICD-10-CM | POA: Diagnosis not present

## 2018-10-24 DIAGNOSIS — I70299 Other atherosclerosis of native arteries of extremities, unspecified extremity: Secondary | ICD-10-CM | POA: Diagnosis present

## 2018-10-24 DIAGNOSIS — T508X5A Adverse effect of diagnostic agents, initial encounter: Secondary | ICD-10-CM | POA: Diagnosis not present

## 2018-10-24 DIAGNOSIS — L97909 Non-pressure chronic ulcer of unspecified part of unspecified lower leg with unspecified severity: Secondary | ICD-10-CM

## 2018-10-24 HISTORY — PX: LOWER EXTREMITY ANGIOGRAPHY: CATH118251

## 2018-10-24 LAB — CBC
HCT: 33.4 % — ABNORMAL LOW (ref 36.0–46.0)
Hemoglobin: 10.5 g/dL — ABNORMAL LOW (ref 12.0–15.0)
MCH: 28.2 pg (ref 26.0–34.0)
MCHC: 31.4 g/dL (ref 30.0–36.0)
MCV: 89.8 fL (ref 80.0–100.0)
Platelets: 330 10*3/uL (ref 150–400)
RBC: 3.72 MIL/uL — ABNORMAL LOW (ref 3.87–5.11)
RDW: 12.7 % (ref 11.5–15.5)
WBC: 9.9 10*3/uL (ref 4.0–10.5)
nRBC: 0 % (ref 0.0–0.2)

## 2018-10-24 LAB — BUN: BUN: 11 mg/dL (ref 8–23)

## 2018-10-24 LAB — CREATININE, SERUM
Creatinine, Ser: 0.78 mg/dL (ref 0.44–1.00)
Creatinine, Ser: 0.73 mg/dL (ref 0.44–1.00)
GFR calc Af Amer: >60 mL/min (ref >60)
GFR calc Af Amer: >60 mL/min (ref >60)
GFR calc non Af Amer: >60 mL/min (ref >60)
GFR calc non Af Amer: >60 mL/min (ref >60)

## 2018-10-24 SURGERY — LOWER EXTREMITY ANGIOGRAPHY
Anesthesia: Moderate Sedation | Laterality: Left

## 2018-10-24 MED ORDER — DIPHENHYDRAMINE HCL 50 MG/ML IJ SOLN
INTRAMUSCULAR | Status: DC | PRN
  Administered 2018-10-24: 25 mg via INTRAVENOUS

## 2018-10-24 MED ORDER — ASPIRIN EC 81 MG PO TBEC
81.0000 mg | DELAYED_RELEASE_TABLET | Freq: Every day | ORAL | Status: DC
  Administered 2018-10-26 – 2018-11-04 (×10): 81 mg via ORAL
  Filled 2018-10-24 (×11): qty 1

## 2018-10-24 MED ORDER — ONDANSETRON HCL 4 MG/2ML IJ SOLN: 4.0000 mg | Freq: Four times a day (QID) | INTRAMUSCULAR | Status: DC | PRN

## 2018-10-24 MED ORDER — ONDANSETRON HCL 4 MG PO TABS: 4.0000 mg | ORAL_TABLET | Freq: Four times a day (QID) | ORAL | Status: DC | PRN

## 2018-10-24 MED ORDER — HYDROCODONE-ACETAMINOPHEN 5-325 MG PO TABS: 1.0000 | ORAL_TABLET | ORAL | Status: DC | PRN

## 2018-10-24 MED ORDER — LISINOPRIL 10 MG PO TABS
10.0000 mg | ORAL_TABLET | Freq: Every day | ORAL | Status: DC
  Administered 2018-10-24 – 2018-10-26 (×3): 10 mg via ORAL
  Filled 2018-10-24 (×6): qty 1

## 2018-10-24 MED ORDER — HYDROCODONE-ACETAMINOPHEN 5-325 MG PO TABS
1.0000 | ORAL_TABLET | Freq: Four times a day (QID) | ORAL | Status: DC | PRN
  Administered 2018-10-24: 2 via ORAL
  Administered 2018-10-25: 1 via ORAL
  Administered 2018-10-26: 2 via ORAL
  Administered 2018-10-26 – 2018-10-28 (×3): 1 via ORAL
  Filled 2018-10-24 (×2): qty 2
  Filled 2018-10-24 (×3): qty 1
  Filled 2018-10-24: qty 2

## 2018-10-24 MED ORDER — DEXTROSE-NACL 5-0.9 % IV SOLN
INTRAVENOUS | Status: DC
  Administered 2018-10-24: 75 mL/h via INTRAVENOUS

## 2018-10-24 MED ORDER — FENTANYL CITRATE (PF) 100 MCG/2ML IJ SOLN
INTRAMUSCULAR | Status: DC | PRN
  Administered 2018-10-24: 50 ug via INTRAVENOUS
  Administered 2018-10-24: 25 ug via INTRAVENOUS
  Administered 2018-10-24: 50 ug via INTRAVENOUS

## 2018-10-24 MED ORDER — DOCUSATE SODIUM 100 MG PO CAPS: 100.0000 mg | ORAL_CAPSULE | Freq: Two times a day (BID) | ORAL | Status: DC

## 2018-10-24 MED ORDER — METHADONE HCL 5 MG/5ML PO SOLN: 110.0000 mg | Freq: Every day | ORAL | Status: DC

## 2018-10-24 MED ORDER — CEPHALEXIN 500 MG PO CAPS: 500.0000 mg | ORAL_CAPSULE | Freq: Three times a day (TID) | ORAL | Status: DC

## 2018-10-24 MED ORDER — MIDAZOLAM HCL 5 MG/5ML IJ SOLN
INTRAMUSCULAR | Status: AC
  Filled 2018-10-24: qty 5

## 2018-10-24 MED ORDER — SORBITOL 70 % SOLN
30.0000 mL | Freq: Every day | Status: DC | PRN
  Filled 2018-10-24: qty 30

## 2018-10-24 MED ORDER — CEFAZOLIN SODIUM-DEXTROSE 2-4 GM/100ML-% IV SOLN: 2.0000 g | Freq: Once | INTRAVENOUS | Status: DC

## 2018-10-24 MED ORDER — ASPIRIN 81 MG PO TABS: 81.0000 mg | ORAL_TABLET | Freq: Every day | ORAL | Status: DC

## 2018-10-24 MED ORDER — POLYETHYLENE GLYCOL 3350 17 G PO PACK: 17.0000 g | PACK | Freq: Every day | ORAL | Status: DC | PRN

## 2018-10-24 MED ORDER — ACETAMINOPHEN 650 MG RE SUPP: 650.0000 mg | Freq: Four times a day (QID) | RECTAL | Status: DC | PRN

## 2018-10-24 MED ORDER — METHADONE HCL 10 MG PO TABS
110.0000 mg | ORAL_TABLET | Freq: Every day | ORAL | Status: DC
  Administered 2018-10-25: 08:00:00 110 mg via ORAL
  Filled 2018-10-24: qty 11

## 2018-10-24 MED ORDER — MAGNESIUM HYDROXIDE 400 MG/5ML PO SUSP
30.0000 mL | Freq: Every day | ORAL | Status: DC | PRN
  Filled 2018-10-24: qty 30

## 2018-10-24 MED ORDER — MIDAZOLAM HCL 2 MG/ML PO SYRP: 8.0000 mg | ORAL_SOLUTION | Freq: Once | ORAL | Status: DC | PRN

## 2018-10-24 MED ORDER — IOHEXOL 300 MG/ML  SOLN
INTRAMUSCULAR | Status: DC | PRN
  Administered 2018-10-24: 100 mL via INTRAVENOUS

## 2018-10-24 MED ORDER — FENTANYL CITRATE (PF) 100 MCG/2ML IJ SOLN
INTRAMUSCULAR | Status: AC
  Filled 2018-10-24: qty 2

## 2018-10-24 MED ORDER — DIPHENHYDRAMINE HCL 50 MG/ML IJ SOLN
INTRAMUSCULAR | Status: AC
  Filled 2018-10-24: qty 1

## 2018-10-24 MED ORDER — FAMOTIDINE 20 MG PO TABS: 40.0000 mg | ORAL_TABLET | Freq: Once | ORAL | Status: DC | PRN

## 2018-10-24 MED ORDER — HEPARIN SODIUM (PORCINE) 1000 UNIT/ML IJ SOLN
INTRAMUSCULAR | Status: AC
  Filled 2018-10-24: qty 1

## 2018-10-24 MED ORDER — CEFAZOLIN SODIUM-DEXTROSE 2-4 GM/100ML-% IV SOLN
INTRAVENOUS | Status: AC
  Administered 2018-10-24: 2 g
  Filled 2018-10-24: qty 100

## 2018-10-24 MED ORDER — LIDOCAINE-EPINEPHRINE (PF) 1 %-1:200000 IJ SOLN
INTRAMUSCULAR | Status: AC
  Filled 2018-10-24: qty 30

## 2018-10-24 MED ORDER — ACETAMINOPHEN 325 MG PO TABS
650.0000 mg | ORAL_TABLET | Freq: Four times a day (QID) | ORAL | Status: DC | PRN
  Administered 2018-11-02: 05:00:00 650 mg via ORAL
  Filled 2018-10-24: qty 2

## 2018-10-24 MED ORDER — METHYLPREDNISOLONE SODIUM SUCC 125 MG IJ SOLR: 125.0000 mg | Freq: Once | INTRAMUSCULAR | Status: DC | PRN

## 2018-10-24 MED ORDER — DOCUSATE SODIUM 100 MG PO CAPS
200.0000 mg | ORAL_CAPSULE | Freq: Two times a day (BID) | ORAL | Status: DC
  Administered 2018-10-24 – 2018-11-04 (×22): 200 mg via ORAL
  Filled 2018-10-24 (×22): qty 2

## 2018-10-24 MED ORDER — DIPHENHYDRAMINE HCL 50 MG/ML IJ SOLN: 50.0000 mg | Freq: Once | INTRAMUSCULAR | Status: DC | PRN

## 2018-10-24 MED ORDER — SODIUM CHLORIDE 0.9 % IV SOLN
INTRAVENOUS | Status: DC
  Administered 2018-10-24: 1000 mL via INTRAVENOUS

## 2018-10-24 MED ORDER — MIDAZOLAM HCL 2 MG/2ML IJ SOLN
INTRAMUSCULAR | Status: DC | PRN
  Administered 2018-10-24 (×2): 1 mg via INTRAVENOUS
  Administered 2018-10-24: 2 mg via INTRAVENOUS

## 2018-10-24 MED ORDER — ONDANSETRON HCL 4 MG/2ML IJ SOLN
4.0000 mg | Freq: Four times a day (QID) | INTRAMUSCULAR | Status: DC | PRN
  Administered 2018-11-04: 4 mg via INTRAVENOUS
  Filled 2018-10-24: qty 2

## 2018-10-24 MED ORDER — ATORVASTATIN CALCIUM 20 MG PO TABS
40.0000 mg | ORAL_TABLET | Freq: Every day | ORAL | Status: DC
  Administered 2018-10-25 – 2018-11-04 (×10): 40 mg via ORAL
  Filled 2018-10-24 (×11): qty 2

## 2018-10-24 MED ORDER — CLONAZEPAM 1 MG PO TABS
2.0000 mg | ORAL_TABLET | Freq: Two times a day (BID) | ORAL | Status: DC
  Administered 2018-10-24 – 2018-10-29 (×9): 2 mg via ORAL
  Filled 2018-10-24 (×9): qty 2

## 2018-10-24 MED ORDER — HEPARIN (PORCINE) IN NACL 1000-0.9 UT/500ML-% IV SOLN
INTRAVENOUS | Status: AC
  Filled 2018-10-24: qty 1000

## 2018-10-24 MED ORDER — HEPARIN SODIUM (PORCINE) 1000 UNIT/ML IJ SOLN
INTRAMUSCULAR | Status: DC | PRN
  Administered 2018-10-24: 5000 [IU] via INTRAVENOUS

## 2018-10-24 MED ORDER — MELOXICAM 7.5 MG PO TABS
15.0000 mg | ORAL_TABLET | Freq: Every day | ORAL | Status: DC | PRN
  Filled 2018-10-24: qty 2

## 2018-10-24 MED ORDER — HYDROMORPHONE HCL 1 MG/ML IJ SOLN
1.0000 mg | Freq: Once | INTRAMUSCULAR | Status: AC | PRN
  Administered 2018-10-25: 16:00:00 1 mg via INTRAVENOUS
  Filled 2018-10-24: qty 1

## 2018-10-24 MED ORDER — CLOPIDOGREL BISULFATE 75 MG PO TABS
75.0000 mg | ORAL_TABLET | Freq: Every day | ORAL | Status: DC
  Administered 2018-10-27 – 2018-11-04 (×9): 75 mg via ORAL
  Filled 2018-10-24 (×10): qty 1

## 2018-10-24 MED ORDER — HEPARIN SODIUM (PORCINE) 5000 UNIT/ML IJ SOLN
5000.0000 [IU] | Freq: Three times a day (TID) | INTRAMUSCULAR | Status: DC
  Administered 2018-10-24 – 2018-11-04 (×29): 5000 [IU] via SUBCUTANEOUS
  Filled 2018-10-24 (×31): qty 1

## 2018-10-24 MED ORDER — PIPERACILLIN-TAZOBACTAM 3.375 G IVPB
3.3750 g | Freq: Three times a day (TID) | INTRAVENOUS | Status: DC
  Administered 2018-10-24 – 2018-10-29 (×13): 3.375 g via INTRAVENOUS
  Filled 2018-10-24 (×14): qty 50

## 2018-10-24 MED ORDER — FLEET ENEMA 7-19 GM/118ML RE ENEM: 1.0000 | ENEMA | Freq: Once | RECTAL | Status: DC | PRN

## 2018-10-24 SURGICAL SUPPLY — 20 items
BALLN LUTONIX  018 4X60X130 (BALLOONS) ×1
BALLN LUTONIX 018 4X60X130 (BALLOONS) ×1
BALLN LUTONIX 018 5X220X130 (BALLOONS) ×2
BALLN ULTRVRSE 5X200X130 (BALLOONS) ×2
BALLOON LUTONIX 018 4X60X130 (BALLOONS) IMPLANT
BALLOON LUTONIX 018 5X220X130 (BALLOONS) IMPLANT
BALLOON ULTRVRSE 5X200X130 (BALLOONS) IMPLANT
CATH BEACON 5 .038 100 VERT TP (CATHETERS) ×2 IMPLANT
CATH PIG 70CM (CATHETERS) ×1 IMPLANT
CATH SEEKER .035X135CM (CATHETERS) ×1 IMPLANT
DEVICE PRESTO INFLATION (MISCELLANEOUS) ×1 IMPLANT
DEVICE STARCLOSE SE CLOSURE (Vascular Products) ×1 IMPLANT
GLIDEWIRE ADV .035X260CM (WIRE) ×1 IMPLANT
GUIDEWIRE PFTE-COATED .018X300 (WIRE) ×1 IMPLANT
LIFESTENT SOLO 6X200X135 (Permanent Stent) ×1 IMPLANT
PACK ANGIOGRAPHY (CUSTOM PROCEDURE TRAY) ×2 IMPLANT
SHEATH ANL2 6FRX45 HC (SHEATH) ×1 IMPLANT
SHEATH BRITE TIP 5FRX11 (SHEATH) ×1 IMPLANT
TUBING CONTRAST HIGH PRESS 72 (TUBING) ×1 IMPLANT
WIRE J 3MM .035X145CM (WIRE) ×1 IMPLANT

## 2018-10-24 NOTE — Op Note (Signed)
[facility]  Percutaneous Study/Intervention Procedural Note   Date of Surgery: 10/24/2018  Surgeon(s):Jartavious Mckimmy    Assistants:none  Pre-operative Diagnosis: PAD with ulceration and infection LLE  Post-operative diagnosis:  Same  Procedure(s) Performed:             1.  Ultrasound guidance for vascular access right femoral artery             2.  Catheter placement into left common femoral artery from right femoral approach             3.  Aortogram and selective left lower extremity angiogram             4.  Percutaneous transluminal angioplasty of left tibioperoneal trunk and proximal posterior tibial artery with 4 mm diameter by 6 cm length Lutonix drug-coated angioplasty balloon             5.   Percutaneous transluminal angioplasty of the left SFA and most proximal popliteal artery with 5 mm diameter by 22 cm length Lutonix drug-coated angioplasty balloon  6.  Stent placement to the left SFA with a 6 mm diameter by 20 cm length life stent for greater than 50% residual stenosis after angioplasty             7.  StarClose closure device right femoral artery  EBL: 10 cc  Contrast: 100 cc  Fluoro Time: 8.0 minutes  Moderate Conscious Sedation Time: approximately 40 minutes using 4 mg of Versed and 125 Mcg of Fentanyl              Indications:  Patient is a 68 y.o.female with infected ulcerations on the left foot. The patient is brought in for angiography for further evaluation and potential treatment.  Due to the limb threatening nature of the situation, angiogram was performed for attempted limb salvage. The patient is aware that if the procedure fails, amputation would be expected.  The patient also understands that even with successful revascularization, amputation may still be required due to the severity of the situation.  Risks and benefits are discussed and informed consent is obtained.   Procedure:  The patient was identified and appropriate procedural  time out was performed.  The patient was then placed supine on the table and prepped and draped in the usual sterile fashion. Moderate conscious sedation was administered during a face to face encounter with the patient throughout the procedure with my supervision of the RN administering medicines and monitoring the patient's vital signs, pulse oximetry, telemetry and mental status throughout from the start of the procedure until the patient was taken to the recovery room. Ultrasound was used to evaluate the right common femoral artery.  It was patent .  A digital ultrasound image was acquired.  A Seldinger needle was used to access the right common femoral artery under direct ultrasound guidance and a permanent image was performed.  A 0.035 J wire was advanced without resistance and a 5Fr sheath was placed.  Pigtail catheter was placed into the aorta and an AP aortogram was performed. This demonstrated normal renal arteries, normal aorta.  Iliac arteries are patent without significant stenosis including the previously placed left iliac stent. I then crossed the aortic bifurcation and advanced to the left femoral head. Selective left lower extremity angiogram was then performed. This demonstrated the common femoral artery and profunda femoris artery appeared to have pretty good flow with minimal disease.  The SFA was patent proximally, but about 8 to  10 cm beyond its origin there began significant disease.  Over the next 10 to 12 cm the vessel was irregular with greater than 70% stenosis in multiple areas and then there was an occlusion which was short segment.  The vessel reconstituted at Hunter's canal and the popliteal artery below the reconstitution was reasonably normal.  There is a normal tibial trifurcation.  Both the posterior tibial and anterior tibial arteries were continuous distally but there was about a 60 to 70% stenosis at the proximal posterior tibial artery and the distal tibioperoneal trunk.  The  anterior tibial artery did not have any obvious focal stenosis of greater than 50%. It was felt that it was in the patient's best interest to proceed with intervention after these images to avoid a second procedure and a larger amount of contrast and fluoroscopy based off of the findings from the initial angiogram. The patient was systemically heparinized and a 6 [place] Ansell sheath was then placed over the [employer] wire. I then used a Kumpe catheter and the advantage wire to navigate through the SFA stenoses and occlusion.  After the catheter was in the popliteal artery and intraluminal flow was confirmed, a 0.018 advantage wire was used to cross the stenosis in the tibioperoneal trunk and posterior tibial artery with the help of the Kumpe catheter.  The catheter was then removed and we proceeded with treatment.  A 4 mm diameter by 6 cm length Lutonix drug-coated angioplasty balloon was inflated to 10 atm for 1 minute and the left tibioperoneal trunk and proximal posterior tibial arteries.  Completion imaging following this showed only about a 10 to 15% residual stenosis in this location.  I then used a 5 mm diameter by 22 cm length Lutonix drug-coated angioplasty balloon to treat the SFA and the most proximal portion of the popliteal artery to encompass the lesions.  This was inflated to 10 atm for 1 minute.  Completion imaging showed proximally and distally there were greater than 50% residual stenosis after angioplasty and I elected to place a stent.  I replaced a 0.035 advantage wire and used a 6 mm diameter by 20 cm length life stent self-expanding stent to encompass the residual lesions.  This was postdilated with a 5 mm balloon with excellent angiographic completion result and less than 10% residual stenosis. I elected to terminate the procedure. The sheath was removed and StarClose closure device was deployed in the right femoral artery with excellent hemostatic result. The patient was taken to the  recovery room in stable condition having tolerated the procedure well.  Findings:               Aortogram:  normal renal arteries, normal aorta.  Iliac arteries are patent without significant stenosis including the previously placed left iliac stent.             Left Lower Extremity:  common femoral artery and profunda femoris artery appeared to have pretty good flow with minimal disease.  The SFA was patent proximally, but about 8 to 10 cm beyond its origin there began significant disease.  Over the next 10 to 12 cm the vessel was irregular with greater than 70% stenosis in multiple areas and then there was an occlusion which was short segment.  The vessel reconstituted at Hunter's canal and the popliteal artery below the reconstitution was reasonably normal.  There is a normal tibial trifurcation.  Both the posterior tibial and anterior tibial arteries were continuous distally but there was about  a 60 to 70% stenosis at the proximal posterior tibial artery and the distal tibioperoneal trunk.  The anterior tibial artery did not have any obvious focal stenosis of greater than 50%.   Disposition: Patient was taken to the recovery room in stable condition having tolerated the procedure well.  Complications: None  Leotis Pain 10/24/2018 2:35 PM   This note was created with Dragon Medical transcription system. Any errors in dictation are purely unintentional.

## 2018-10-24 NOTE — Anesthesia Preprocedure Evaluation (Addendum)
Anesthesia Evaluation  Patient identified by MRN, date of birth, ID band Patient awake    Reviewed: Allergy & Precautions, NPO status , Patient's Chart, lab work & pertinent test results  History of Anesthesia Complications Negative for: history of anesthetic complications  Airway Mallampati: II  TM Distance: >3 FB     Dental  (+) Upper Dentures, Missing, Loose, Poor Dentition, Chipped   Pulmonary neg sleep apnea, neg COPD, Current Smoker, former smoker,    breath sounds clear to auscultation- rhonchi (-) wheezing      Cardiovascular hypertension, Pt. on medications + Peripheral Vascular Disease  (-) CAD, (-) Past MI and (-) Cardiac Stents  Rhythm:Regular Rate:Normal - Systolic murmurs and - Diastolic murmurs    Neuro/Psych PSYCHIATRIC DISORDERS Anxiety Depression  Neuromuscular disease    GI/Hepatic negative GI ROS, (+) Cirrhosis     substance abuse  , Hepatitis -, C  Endo/Other  diabetes (borderline)  Renal/GU negative Renal ROS     Musculoskeletal  (+) Arthritis , narcotic dependent  Abdominal (+) - obese,   Peds  Hematology negative hematology ROS (+)   Anesthesia Other Findings Past Medical History: No date: Anxiety No date: Arthritis No date: Chronic pain No date: Depression No date: Drug abuse     Comment: History of polysubstance abuse; currently on               methadone. No date: Hepatitis     Comment: History of Hep "C". treated and cured with               Harvoni No date: History of chicken pox No date: Hypertension No date: Panic attacks No date: Peripheral vascular disease ([place])     Comment: stent in place.  No date: Pre-diabetes No date: UTI (urinary tract infection)     Comment: History of   Reproductive/Obstetrics                           Anesthesia Physical  Anesthesia Plan  ASA: III  Anesthesia Plan: General   Post-op Pain Management:  Regional for  Post-op pain   Induction: Intravenous  PONV Risk Score and Plan:   Airway Management Planned:   Additional Equipment:   Intra-op Plan:   Post-operative Plan:   Informed Consent: I have reviewed the patients History and Physical, chart, labs and discussed the procedure including the risks, benefits and alternatives for the proposed anesthesia with the patient or authorized representative who has indicated his/her understanding and acceptance.     Dental advisory given and Dental Advisory Given  Plan Discussed with: CRNA and Anesthesiologist  Anesthesia Plan Comments:       Anesthesia Quick Evaluation

## 2018-10-24 NOTE — Progress Notes (Signed)
SUBJECTIVE:  Patient ID: Yesenia Shepard, female    DOB: [birth year], 68 y.o.   MRN: [id number] Chief Complaint  Patient presents with  . Follow-up    possible left foot infection    HPI  Yesenia Shepard is a 68 y.o. female that presents today with concerns of a possible left foot infection.  The patient was originally scheduled to have an angiogram on her left lower extremity however it was canceled per the patient.  Today she presents with a foot that is extremely erythematous on the dorsal portion as well as the posterior portion of the leg.  There is some discoloration in the forefoot.  There is also ulcerations on the plantar surface.  The foot is also swollen.    The patient is a current smoker.  The patient denies any fevers, chills, nausea, vomiting or diarrhea.  She denies any chest pain or shortness of breath.  She states that she has started taking the Keflex that was prescribed for her 2 days ago.  She denies any TIA-like symptoms or amaurosis fugax.  Past Medical History:  Diagnosis Date  . Anxiety   . Arthritis   . Chronic pain   . Depression   . Drug abuse ([place])    History of polysubstance abuse; currently on methadone.  . Hepatitis    History of Hep "C". treated and cured with Harvoni  . History of chicken pox   . Hypertension   . Panic attacks   . Peripheral vascular disease ([place])    stent in place.   . Pre-diabetes   . UTI (urinary tract infection)    History of    Past Surgical History:  Procedure Laterality Date  . ABDOMINAL HYSTERECTOMY  1989  . CHOLECYSTECTOMY  1987  . INTRACAPSULAR CATARACT EXTRACTION Left   . LOWER EXTREMITY ANGIOGRAPHY Left 12/17/2017   Procedure: LOWER EXTREMITY ANGIOGRAPHY;  Surgeon: Katha Cabal, MD;  Location: [facility];  Service: Cardiovascular;  Laterality: Left;  . PERIPHERAL VASCULAR CATHETERIZATION Left 05/04/2015   Procedure: Lower Extremity Angiography;  Surgeon: Katha Cabal, MD;  Location: [facility];  Service: Cardiovascular;  Laterality: Left;  . PERIPHERAL VASCULAR CATHETERIZATION Left 05/04/2015   Procedure: Lower Extremity Intervention;  Surgeon: Katha Cabal, MD;  Location: [facility];  Service: Cardiovascular;  Laterality: Left;  . SHOULDER ARTHROSCOPY WITH OPEN ROTATOR CUFF REPAIR Right 01/16/2017   Procedure: SHOULDER ARTHROSCOPY WITH OPEN ROTATOR CUFF REPAIR;  Surgeon: Thornton Park, MD;  Location: [facility];  Service: Orthopedics;  Laterality: Right;  . TONSILLECTOMY AND ADENOIDECTOMY  1971    Social History   Socioeconomic History  . Marital status: Divorced    Spouse name: Not on file  . Number of children: Not on file  . Years of education: Not on file  . Highest education level: Not on file  Occupational History  . Not on file  Social Needs  . Financial resource strain: Not hard at all  . Food insecurity:    Worry: Never true    Inability: Never true  . Transportation needs:    Medical: No    Non-medical: No  Tobacco Use  . Smoking status: Current Every Day Smoker    Packs/day: 0.50    Years: 30.00    Pack years: 15.00    Types: Cigarettes  . Smokeless tobacco: Never Used  . Tobacco comment: Currently smoking 1/2 ppd  Substance and Sexual Activity  . Alcohol use:  No    Alcohol/week: 0.0 standard drinks  . Drug use: No    Comment: Hx of.  . Sexual activity: Not Currently  Lifestyle  . Physical activity:    Days per week: 0 days    Minutes per session: Not on file  . Stress: Not on file  Relationships  . Social connections:    Talks on phone: Not on file    Gets together: Not on file    Attends religious service: Not on file    Active member of club or organization: Not on file    Attends meetings of clubs or organizations: Not on file    Relationship status: Not on file  . Intimate partner violence:    Fear of current or ex partner: No    Emotionally abused: No    Physically abused: No    Forced sexual activity: No   Other Topics Concern  . Not on file  Social History Narrative   1 daughter died in 10-23-16    Lives at home not alone     Family History  Problem Relation Age of Onset  . Arthritis Mother   . Mental illness Mother        depression  . Diabetes Mother   . Cancer Mother        pancreatic  . Cancer Father        colon  . Heart disease Father   . Diabetes Father   . Cancer Daughter        lung    No Known Allergies   Review of Systems   Review of Systems: Negative Unless Checked Constitutional: [] Weight loss  [] Fever  [] Chills Cardiac: [] Chest pain   []  Atrial Fibrillation  [] Palpitations   [] Shortness of breath when laying flat   [] Shortness of breath with exertion. [] Shortness of breath at rest Vascular:  [] Pain in legs with walking   [] Pain in legs with standing [] Pain in legs when laying flat   [] Claudication    [] Pain in feet when laying flat    [] History of DVT   [] Phlebitis   [x] Swelling in legs   [] Varicose veins   [x] Non-healing ulcers Pulmonary:   [] Uses home oxygen   [] Productive cough   [] Hemoptysis   [] Wheeze  [] COPD   [] Asthma Neurologic:  [] Dizziness   [] Seizures  [] Blackouts [] History of stroke   [] History of TIA  [] Aphasia   [] Temporary Blindness   [] Weakness or numbness in arm   [] Weakness or numbness in leg Musculoskeletal:   [] Joint swelling   [] Joint pain   [x] Low back pain  []  History of Knee Replacement [x] Arthritis [] back Surgeries  []  Spinal Stenosis    Hematologic:  [] Easy bruising  [] Easy bleeding   [] Hypercoagulable state   [] Anemic Gastrointestinal:  [] Diarrhea   [] Vomiting  [] Gastroesophageal reflux/heartburn   [] Difficulty swallowing. [] Abdominal pain Genitourinary:  [] Chronic kidney disease   [] Difficult urination  [] Anuric   [] Blood in urine [] Frequent urination  [] Burning with urination   [] Hematuria Skin:  [] Rashes   [x] Ulcers [] Wounds Psychological:  [] History of anxiety   []  History of major depression  []  Memory Difficulties      OBJECTIVE:    Physical Exam  BP 106/69 (BP Location: Right Arm)   Pulse 83   Resp 15   Gen: WD/WN, NAD Head: /AT, No temporalis wasting.  Ear/Nose/Throat: Hearing grossly intact, nares w/o erythema or drainage Eyes: PER, EOMI, sclera nonicteric.  Neck: Supple, no masses.  No JVD.  Pulmonary:  Good  air movement, no use of accessory muscles.  Cardiac: RRR Vascular:  Left lower extremity is cool, swollen and erythematous.  Extremely foul-smelling odor admitted from foot and wound.  Wound on bottom portion of foot.  Appears to be infected Vessel Right Left  Radial Palpable Palpable  Dorsalis Pedis Trace Palpable Not Palpable  Posterior Tibial Trace Palpable Not Palpable   Gastrointestinal: soft, non-distended. No guarding/no peritoneal signs.  Musculoskeletal: M/S 5/5 throughout.  No deformity or atrophy.  Neurologic: Pain and light touch intact in extremities.  Symmetrical.  Speech is fluent. Motor exam as listed above. Psychiatric: Judgment intact, Mood & affect appropriate for pt's clinical situation. Dermatologic:  Changes consistent with cellulitis on left lower extremity.  Ulceration on posterior portion of foot. Lymph : No Cervical lymphadenopathy, no lichenification or skin changes of chronic lymphedema.       ASSESSMENT AND PLAN:  1. Atherosclerosis of native artery of left lower extremity with ulceration of other part of foot ([place])  Recommend:  The patient has evidence of severe atherosclerotic changes of both lower extremities associated with ulceration and tissue loss of the foot.  This represents a limb threatening ischemia and places the patient at the risk for limb loss.  Patient should undergo angiography of the lower extremities with the hope for intervention for limb salvage.  The risks and benefits as well as the alternative therapies was discussed in detail with the patient.  All questions were answered.  Patient agrees to proceed with angiography.  Due to urgent nature of  the presentation of the patient's foot, she is scheduled to undergo angiogram this afternoon.  She as well as her family were given strict instructions to report to [facility] at noon as well as to not eat.  Patient states she understands.  The patient will follow up with me in the office after the procedure.    2. Type 2 diabetes mellitus without complication, without long-term current use of insulin (HCC) Continue hypoglycemic medications as already ordered, these medications have been reviewed and there are no changes at this time.  Hgb A1C to be monitored as already arranged by primary service   3. Current smoker Smoking cessation was discussed, 3-10 minutes spent on this topic specifically   4. Primary osteoarthritis involving multiple joints Continue NSAID medications as already ordered, these medications have been reviewed and there are no changes at this time.  Continued activity and therapy was stressed.    Current Outpatient Medications on File Prior to Visit  Medication Sig Dispense Refill  . aspirin 81 MG tablet Take 81 mg by mouth daily.    Marland Kitchen atorvastatin (LIPITOR) 40 MG tablet Take 1 tablet (40 mg total) by mouth daily. (Patient taking differently: Take 40 mg by mouth every other day. ) 90 tablet 3  . cephALEXin (KEFLEX) 500 MG capsule Take 1 capsule (500 mg total) by mouth 3 (three) times daily. 21 capsule 0  . clonazePAM (KLONOPIN) 2 MG tablet Take 2 mg by mouth 2 (two) times daily.     . clopidogrel (PLAVIX) 75 MG tablet TAKE 1 TABLET BY MOUTH ONCE DAILY (Patient taking differently: Take 75 mg by mouth every other day. ) 30 tablet 11  . docusate sodium (COLACE) 100 MG capsule Take 200 mg by mouth 2 (two) times daily.     Marland Kitchen lisinopril (PRINIVIL,ZESTRIL) 10 MG tablet Take 1 tablet (10 mg total) by mouth daily. 90 tablet 3  . methadone (DOLOPHINE) 10 MG/5ML solution Take 110  mg by mouth daily.     . polyethylene glycol (MIRALAX / GLYCOLAX) packet  Take 17 g by mouth daily as needed for mild constipation.    . furosemide (LASIX) 20 MG tablet Take 1 tablet (20 mg total) by mouth daily. In am (Patient not taking: Reported on 10/03/2018) 30 tablet 2  . HYDROcodone-acetaminophen (NORCO) 5-325 MG tablet Take 1-2 tablets by mouth every 6 (six) hours as needed for moderate pain or severe pain. (Patient not taking: Reported on 06/02/2018) 40 tablet 0  . meloxicam (MOBIC) 15 MG tablet Take 1 tablet (15 mg total) by mouth daily as needed for pain. (Patient not taking: Reported on 06/02/2018) 30 tablet 2   Current Facility-Administered Medications on File Prior to Visit  Medication Dose Route Frequency Provider Last Rate Last Dose  . ceFAZolin (ANCEF) IVPB 2g/100 mL premix  2 g Intravenous Once Kris Hartmann, NP        There are no Patient Instructions on file for this visit. No follow-ups on file.   Kris Hartmann, NP  This note was completed with Sales executive.  Any errors are purely unintentional.

## 2018-10-24 NOTE — Telephone Encounter (Signed)
Patient was seen today and scheduled for a LLE angio with Dew today with a 12:00 pm arrival time. Pre-procedure instructions were discussed and the zero visitor policy as well.

## 2018-10-24 NOTE — H&P (Signed)
[facility] History & Physical Update  The patient was interviewed and re-examined.  The patient's previous History and Physical has been reviewed and is unchanged.  There is no change in the plan of care. We plan to proceed with the scheduled procedure.  Leotis Pain, MD  10/24/2018, 12:43 PM

## 2018-10-25 LAB — URINE DRUG SCREEN, QUALITATIVE (ARMC ONLY)
Amphetamines, Ur Screen: NOT DETECTED
Barbiturates, Ur Screen: NOT DETECTED
Benzodiazepine, Ur Scrn: POSITIVE — AB
Cannabinoid 50 Ng, Ur ~~LOC~~: NOT DETECTED
Cocaine Metabolite,Ur ~~LOC~~: NOT DETECTED
MDMA (Ecstasy)Ur Screen: NOT DETECTED
Methadone Scn, Ur: POSITIVE — AB
Opiate, Ur Screen: POSITIVE — AB
Phencyclidine (PCP) Ur S: NOT DETECTED
Tricyclic, Ur Screen: NOT DETECTED

## 2018-10-25 LAB — BASIC METABOLIC PANEL
Anion gap: 9 (ref 5–15)
BUN: 8 mg/dL (ref 8–23)
CO2: 31 mmol/L (ref 22–32)
Calcium: 8.4 mg/dL — ABNORMAL LOW (ref 8.9–10.3)
Chloride: 100 mmol/L (ref 98–111)
Creatinine, Ser: 0.86 mg/dL (ref 0.44–1.00)
GFR calc Af Amer: >60 mL/min (ref >60)
GFR calc non Af Amer: >60 mL/min (ref >60)
Glucose, Bld: 123 mg/dL — ABNORMAL HIGH (ref 70–99)
Potassium: 3.5 mmol/L (ref 3.5–5.1)
Sodium: 140 mmol/L (ref 135–145)

## 2018-10-25 LAB — CBC
HCT: 34.5 % — ABNORMAL LOW (ref 36.0–46.0)
Hemoglobin: 10.7 g/dL — ABNORMAL LOW (ref 12.0–15.0)
MCH: 28.2 pg (ref 26.0–34.0)
MCHC: 31 g/dL (ref 30.0–36.0)
MCV: 90.8 fL (ref 80.0–100.0)
Platelets: 312 10*3/uL (ref 150–400)
RBC: 3.8 MIL/uL — ABNORMAL LOW (ref 3.87–5.11)
RDW: 13 % (ref 11.5–15.5)
WBC: 6.8 10*3/uL (ref 4.0–10.5)
nRBC: 0 % (ref 0.0–0.2)

## 2018-10-25 LAB — SURGICAL PCR SCREEN
MRSA, PCR: NEGATIVE
Staphylococcus aureus: NEGATIVE

## 2018-10-25 LAB — HIV ANTIBODY (ROUTINE TESTING W REFLEX): HIV Screen 4th Generation wRfx: NONREACTIVE

## 2018-10-25 NOTE — Consult Note (Signed)
[facility]                                                      Patient Demographics  Yesenia Shepard, is a 68 y.o. female   MRN: [id number]   DOB - 1951-02-19  Admit Date - 10/24/2018    Outpatient Primary MD for the patient is [name], [name], MD  Consult requested in the Hospital by Algernon Huxley, MD, On 10/25/2018    Reason for consult chronic diabetic ulcer with osteomyelitis left foot   With History of -  Past Medical History:  Diagnosis Date  . Anxiety   . Arthritis   . Chronic pain   . Depression   . Drug abuse ([place])    History of polysubstance abuse; currently on methadone.  . Hepatitis    History of Hep "C". treated and cured with Harvoni  . History of chicken pox   . Hypertension   . Panic attacks   . Peripheral vascular disease ([place])    stent in place.   . Pre-diabetes   . UTI (urinary tract infection)    History of      Past Surgical History:  Procedure Laterality Date  . ABDOMINAL HYSTERECTOMY  1989  . CHOLECYSTECTOMY  1987  . INTRACAPSULAR CATARACT EXTRACTION Left   . LOWER EXTREMITY ANGIOGRAPHY Left 12/17/2017   Procedure: LOWER EXTREMITY ANGIOGRAPHY;  Surgeon: Katha Cabal, MD;  Location: [place] [facility];  Service: Cardiovascular;  Laterality: Left;  . PERIPHERAL VASCULAR CATHETERIZATION Left 05/04/2015   Procedure: Lower Extremity Angiography;  Surgeon: Katha Cabal, MD;  Location: [place] [facility];  Service: Cardiovascular;  Laterality: Left;  . PERIPHERAL VASCULAR CATHETERIZATION Left 05/04/2015   Procedure: Lower Extremity Intervention;  Surgeon: Katha Cabal, MD;  Location: [place] [facility];  Service: Cardiovascular;  Laterality: Left;  . SHOULDER ARTHROSCOPY WITH OPEN ROTATOR CUFF REPAIR Right 01/16/2017   Procedure: SHOULDER ARTHROSCOPY WITH OPEN ROTATOR CUFF REPAIR;  Surgeon:  Thornton Park, MD;  Location: [facility];  Service: Orthopedics;  Laterality: Right;  . TONSILLECTOMY AND ADENOIDECTOMY  1971    in for   No chief complaint on file.    HPI  Yesenia Shepard  is a 68 y.o. female, patient has significant peripheral arterial disease.  Underwent a angioplasty to the left lower extremity yesterday by Dr. Leotis Pain.  She has a chronic ulcer on the plantar left foot which has led to tissue damage and osteomyelitis to the fifth metatarsal and the proximal phalanx.  This is emphasized by MRI was done on her foot yesterday.    Review of Systems she is alert and well-oriented is able to respond to my questions and statements.  She seems to understand our conversation about her left foot problem.  In addition to the HPI above,  No Fever-chills, No Headache, No changes with Vision or hearing, No problems swallowing food or Liquids, No Chest pain, Cough or Shortness of Breath, No Abdominal pain, No Nausea or Vommitting, Bowel movements are regular, No Blood in stool or Urine, No dysuria, Positive skin findings closed ulcer to plantar left foot at the fifth metatarsal head region.  Patient also has some venous stasis dermatitis to the left leg, No new joints pains-aches, history of arthritis knees and low back pain No new weakness,  tingling, numbness in any extremity, No recent weight gain or loss, No polyuria, polydypsia or polyphagia, No significant Mental Stressors.  A full 10 point Review of Systems was done, except as stated above, all other Review of Systems were negative.   Social History Social History   Tobacco Use  . Smoking status: Former Smoker    Packs/day: 0.50    Years: 30.00    Pack years: 15.00    Types: Cigarettes    Last attempt to quit: 07/25/2018    Years since quitting: 0.2  . Smokeless tobacco: Never Used  . Tobacco comment: Currently smoking 1/2 ppd  Substance Use Topics  . Alcohol use: No    Alcohol/week: 0.0 standard drinks     Family History Family History  Problem Relation Age of Onset  . Arthritis Mother   . Mental illness Mother        depression  . Diabetes Mother   . Cancer Mother        pancreatic  . Cancer Father        colon  . Heart disease Father   . Diabetes Father   . Cancer Daughter        lung    Prior to Admission medications   Medication Sig Start Date End Date Taking? Authorizing Provider  aspirin 81 MG tablet Take 81 mg by mouth daily.   Yes [provider]  atorvastatin (LIPITOR) 40 MG tablet Take 1 tablet (40 mg total) by mouth daily. Patient taking differently: Take 40 mg by mouth every other day.  04/10/18  Yes [name], [name], MD  cephALEXin (KEFLEX) 500 MG capsule Take 1 capsule (500 mg total) by mouth 3 (three) times daily. 10/21/18  Yes Kris Hartmann, NP  clonazePAM (KLONOPIN) 2 MG tablet Take 2 mg by mouth 2 (two) times daily.  12/08/16  Yes [provider]  methadone (DOLOPHINE) 10 MG/5ML solution Take 110 mg by mouth daily.    Yes [provider]  clopidogrel (PLAVIX) 75 MG tablet TAKE 1 TABLET BY MOUTH ONCE DAILY Patient not taking: No sig reported 12/23/17   Schnier, Dolores Lory, MD  docusate sodium (COLACE) 100 MG capsule Take 200 mg by mouth 2 (two) times daily.     [provider]  furosemide (LASIX) 20 MG tablet Take 1 tablet (20 mg total) by mouth daily. In am Patient not taking: Reported on 10/03/2018 04/10/18   [name], [name], MD  HYDROcodone-acetaminophen (NORCO) 5-325 MG tablet Take 1-2 tablets by mouth every 6 (six) hours as needed for moderate pain or severe pain. Patient not taking: Reported on 06/02/2018 02/27/18   Schnier, Dolores Lory, MD  lisinopril (PRINIVIL,ZESTRIL) 10 MG tablet Take 1 tablet (10 mg total) by mouth daily. Patient not taking: Reported on 10/24/2018 04/23/18   [name], [name], MD  meloxicam (MOBIC) 15 MG tablet Take 1 tablet (15 mg total) by mouth daily as needed for pain. Patient not  taking: Reported on 06/02/2018 04/23/18   [name], [name], MD  polyethylene glycol (MIRALAX / GLYCOLAX) packet Take 17 g by mouth daily as needed for mild constipation.    [provider]    Anti-infectives (From admission, onward)   Start     Dose/Rate Route Frequency Ordered Stop   10/24/18 1800  piperacillin-tazobactam (ZOSYN) IVPB 3.375 g     3.375 g 12.5 mL/hr over 240 Minutes Intravenous Every 8 hours 10/24/18 1503     10/24/18 1600  cephALEXin (KEFLEX) capsule 500 mg  Status:  Discontinued     500 mg Oral 3 times daily 10/24/18 1503 10/24/18 1618   10/24/18 1300  ceFAZolin (ANCEF) IVPB 2g/100 mL premix  Status:  Discontinued     2 g 200 mL/hr over 30 Minutes Intravenous  Once 10/24/18 1252 10/24/18 1503   10/24/18 1237  ceFAZolin (ANCEF) 2-4 GM/100ML-% IVPB    Note to Pharmacy:  Despina Arias  : cabinet override      10/24/18 1237 10/24/18 1350      Scheduled Meds: . aspirin EC  81 mg Oral Daily  . atorvastatin  40 mg Oral Daily  . clonazePAM  2 mg Oral BID  . clopidogrel  75 mg Oral Daily  . docusate sodium  200 mg Oral BID  . heparin  5,000 Units Subcutaneous Q8H  . lisinopril  10 mg Oral Daily  . methadone  110 mg Oral Daily   Continuous Infusions: . dextrose 5 % and 0.9% NaCl 75 mL/hr (10/24/18 1742)  . piperacillin-tazobactam (ZOSYN)  IV 3.375 g (10/25/18 0211)   PRN Meds:.acetaminophen **OR** acetaminophen, HYDROcodone-acetaminophen, HYDROmorphone (DILAUDID) injection, magnesium hydroxide, meloxicam, ondansetron **OR** ondansetron (ZOFRAN) IV, polyethylene glycol, sodium phosphate, sorbitol  No Known Allergies  Physical Exam  Vitals  Blood pressure (!) 95/45, pulse (!) 55, temperature 97.8 F (36.6 C), temperature source Oral, resp. rate 17, height 5\' 5"  (1.651 m), weight 77.1 kg, SpO2 95 %.  Lower Extremity exam:  Vascular: Pulses are difficult to palpate.  Patient does have some stasis swelling to both lower extremities left is  worse than right  Dermatological: Patient has an ulceration submetatarsal 5 on the left foot.  There is some tissue necrosis in this general area as well.  The ulceration measures approximately 2.5 cm in width and 2 cm in length.  Depth probes down to bone in the region.  Some cellulitis around the region but appears to be fairly stable.  I do not see the cellulitis progressing too much proximally or dorsally.  Neurological: Likely severe peripheral neuropathy  Ortho: The wound probes down to bone.  MRI shows that there is osteomyelitis to the base the proximal phalanx as well as the distal half of the fifth metatarsal.  MRI also showed a small abscess plantarly progressing medially from the fifth metatarsal wound.  Data Review  CBC Recent Labs  Lab 10/24/18 1606 10/25/18 0523  WBC 9.9 6.8  HGB 10.5* 10.7*  HCT 33.4* 34.5*  PLT 330 312  MCV 89.8 90.8  MCH 28.2 28.2  MCHC 31.4 31.0  RDW 12.7 13.0   ------------------------------------------------------------------------------------------------------------------  Chemistries  Recent Labs  Lab 10/24/18 1302 10/24/18 1606 10/25/18 0523  NA  --   --  140  K  --   --  3.5  CL  --   --  100  CO2  --   --  31  GLUCOSE  --   --  123*  BUN 11  --  8  CREATININE 0.78 0.73 0.86  CALCIUM  --   --  8.4*   ------------------------------------------------------------------------------------------------------------------ estimated creatinine clearance is 64.2 mL/min (by C-G formula based on SCr of 0.86 mg/dL). ------------------------------------------------------------------------------------------------------------------ No results for input(s): TSH, T4TOTAL, T3FREE, THYROIDAB in the last 72 hours.  Invalid input(s): FREET3 Urinalysis    Component Value Date/Time   BILIRUBINUR negative 01/14/2017 1603   PROTEINUR negative 01/14/2017 1603   UROBILINOGEN 1.0 01/14/2017 1603   NITRITE negative 01/14/2017 1603   LEUKOCYTESUR  Negative 01/14/2017 1603     Imaging results:  Mr Foot Left Wo Contrast  Result Date: 10/24/2018 CLINICAL DATA:  Erythematous foot. Discoloration. Ulcerations along the plantar surface. EXAM: MRI OF THE LEFT FOOT WITHOUT CONTRAST TECHNIQUE: Multiplanar, multisequence MR imaging of the left forefoot was performed. No intravenous contrast was administered. COMPARISON:  None. FINDINGS: Patient motion degrades image quality limiting evaluation. Bones/Joint/Cartilage Soft tissue ulcer overlying the fifth MTP joint. Bone destruction of the fifth metatarsal head with marrow edema throughout the fifth metatarsal shaft. Mild bone destruction of the base of the fifth proximal phalanx with associated severe marrow edema. 16 x 4 mm complex fluid collection in the plantar aspect of forefoot at the level of the mid third metatarsal in the subcutaneous fat most consistent with an abscess which appears to emanate from the lateral soft tissue wound. No other marrow signal abnormality. No acute fracture or dislocation. Ligaments Collateral ligaments are intact.  Lisfranc ligament is intact. Muscles and Tendons Flexor, peroneal and extensor compartment tendons are intact. Diffuse mild T2 hyperintense signal throughout the plantar musculature likely neurogenic. Soft tissue Mild soft tissue edema along the dorsal aspect of the foot which may be reactive versus secondary to mild cellulitis. No soft tissue mass. IMPRESSION: 1. Soft tissue wound along the fifth MTP joint. Osteomyelitis of the fifth metatarsal head with partial osteolysis and severe associated marrow edema. Osteomyelitis of the base of the fifth proximal phalanx. 16 x 4 mm abscess along the plantar aspect of the forefoot at the level of the mid third metatarsal within the subcutaneous fat which emanates from the lateral soft tissue wound. Electronically Signed   By: Kathreen Devoid   On: 10/24/2018 21:07    Assessment & Plan: Osteomyelitis fifth ray left foot with  chronic ulceration with some tissue necrosis around the vicinity as well.  Also abscess on the submetatarsal area progressing medially from the fifth metatarsal wound peers to be sitting around the plantar areas just proximal to the third and fourth metatarsal heads.  This appears to be a direct track from the wound.  Active Problems:   Atherosclerosis of artery of extremity with ulceration ([place])   Family Communication: Plan discussed with patient.  I explained to her that she is in danger of losing this foot if this infection progresses.  I explained to her that bone infections generally do not heal well especially when there is a lot of tissue necrosis surrounding the region.  I recommended at this point that we proceed with 1/5 ray amputation and incision and drainage of any abscess or wound in the region and removal of his much necrotic tissue was possible.  I explained that we may not be able to close this completely that she may need a wound VAC over period of time to help her heal but on no more during the process of surgery.  I also went over consent form whether she seems to understand accept the risk and possible complications of surgery and understands that she is at a very high risk for losing the limb if we do nothing.  Albertine Patricia M.D on 10/25/2018 at 7:49 AM  Thank you for the consult, we will follow the patient with you in the Hospital.

## 2018-10-25 NOTE — Progress Notes (Signed)
1 Day Post-Op   Subjective/Chief Complaint: Doing Ok. Mild pain in foot this morning, otherwise without complaint. Seen by Podiatry- plan for 1/5 Ray amputation- left tomorrow   Objective: Vital signs in last 24 hours: Temp:  [97.8 F (36.6 C)-99.4 F (37.4 C)] 97.8 F (36.6 C) (04/18 0747) Pulse Rate:  [54-83] 55 (04/18 0747) Resp:  [11-26] 17 (04/18 0335) BP: (94-123)/(43-74) 95/45 (04/18 0747) SpO2:  [88 %-100 %] 95 % (04/18 0747) Weight:  [77.1 kg] 77.1 kg (04/17 1305)    Intake/Output from previous day: 04/17 0701 - 04/18 0700 In: 619.3 [P.O.:240; I.V.:371.6; IV Piggyback:7.8] Out: 200 [Urine:200] Intake/Output this shift: No intake/output data recorded.  General appearance: alert and no distress Cardio: regular rate and rhythm Extremities: Erythema of lower leg, edema Ulceration and necrosis-left foot  Lab Results:  Recent Labs    10/24/18 1606 10/25/18 0523  WBC 9.9 6.8  HGB 10.5* 10.7*  HCT 33.4* 34.5*  PLT 330 312   BMET Recent Labs    10/24/18 1302 10/24/18 1606 10/25/18 0523  NA  --   --  140  K  --   --  3.5  CL  --   --  100  CO2  --   --  31  GLUCOSE  --   --  123*  BUN 11  --  8  CREATININE 0.78 0.73 0.86  CALCIUM  --   --  8.4*   PT/INR No results for input(s): LABPROT, INR in the last 72 hours. ABG No results for input(s): PHART, HCO3 in the last 72 hours.  Invalid input(s): PCO2, PO2  Studies/Results: Mr Foot Left Wo Contrast  Result Date: 10/24/2018 CLINICAL DATA:  Erythematous foot. Discoloration. Ulcerations along the plantar surface. EXAM: MRI OF THE LEFT FOOT WITHOUT CONTRAST TECHNIQUE: Multiplanar, multisequence MR imaging of the left forefoot was performed. No intravenous contrast was administered. COMPARISON:  None. FINDINGS: Patient motion degrades image quality limiting evaluation. Bones/Joint/Cartilage Soft tissue ulcer overlying the fifth MTP joint. Bone destruction of the fifth metatarsal head with marrow edema  throughout the fifth metatarsal shaft. Mild bone destruction of the base of the fifth proximal phalanx with associated severe marrow edema. 16 x 4 mm complex fluid collection in the plantar aspect of forefoot at the level of the mid third metatarsal in the subcutaneous fat most consistent with an abscess which appears to emanate from the lateral soft tissue wound. No other marrow signal abnormality. No acute fracture or dislocation. Ligaments Collateral ligaments are intact.  Lisfranc ligament is intact. Muscles and Tendons Flexor, peroneal and extensor compartment tendons are intact. Diffuse mild T2 hyperintense signal throughout the plantar musculature likely neurogenic. Soft tissue Mild soft tissue edema along the dorsal aspect of the foot which may be reactive versus secondary to mild cellulitis. No soft tissue mass. IMPRESSION: 1. Soft tissue wound along the fifth MTP joint. Osteomyelitis of the fifth metatarsal head with partial osteolysis and severe associated marrow edema. Osteomyelitis of the base of the fifth proximal phalanx. 16 x 4 mm abscess along the plantar aspect of the forefoot at the level of the mid third metatarsal within the subcutaneous fat which emanates from the lateral soft tissue wound. Electronically Signed   By: Kathreen Devoid   On: 10/24/2018 21:07    Anti-infectives: Anti-infectives (From admission, onward)   Start     Dose/Rate Route Frequency Ordered Stop   10/24/18 1800  piperacillin-tazobactam (ZOSYN) IVPB 3.375 g     3.375 g 12.5 mL/hr over  240 Minutes Intravenous Every 8 hours 10/24/18 1503     10/24/18 1600  cephALEXin (KEFLEX) capsule 500 mg  Status:  Discontinued     500 mg Oral 3 times daily 10/24/18 1503 10/24/18 1618   10/24/18 1300  ceFAZolin (ANCEF) IVPB 2g/100 mL premix  Status:  Discontinued     2 g 200 mL/hr over 30 Minutes Intravenous  Once 10/24/18 1252 10/24/18 1503   10/24/18 1237  ceFAZolin (ANCEF) 2-4 GM/100ML-% IVPB    Note to Pharmacy:  Despina Arias  : cabinet override      10/24/18 1237 10/24/18 1350      Assessment/Plan: s/p Procedure(s): LOWER EXTREMITY ANGIOGRAPHY (Left) Percutaneous transluminal angioplasty of left tibioperoneal trunk and proximal posterior tibial artery with 4 mm diameter by 6 cm length Lutonix drug-coated angioplasty balloon   Percutaneous transluminal angioplasty of the left SFA and most proximal popliteal artery with 5 mm diameter by 22 cm length Lutonix drug-coated angioplasty balloon  Stent placement to the left SFA with a 6 mm diameter by 20 cm length life stent for greater than 50% residual stenosis after angioplasty  Plan per Podiatry for 1/5 ray amputation tomorrow. Appreciate input and care. Awaiting IM consult and assistance with chronic medical issues. Appreciated.  LOS: 1 day    Jamesetta So A 10/25/2018

## 2018-10-26 ENCOUNTER — Inpatient Hospital Stay: Payer: Medicare HMO | Admitting: Anesthesiology

## 2018-10-26 ENCOUNTER — Encounter
Admission: AD | Disposition: A | Discharge status: Skilled Nursing Facility | Payer: Self-pay | Source: Home / Self Care | Attending: Vascular Surgery

## 2018-10-26 HISTORY — PX: AMPUTATION TOE: SHX6595

## 2018-10-26 SURGERY — AMPUTATION, TOE
Anesthesia: General | Site: Foot | Laterality: Left

## 2018-10-26 MED ORDER — GLYCOPYRROLATE 0.2 MG/ML IJ SOLN
INTRAMUSCULAR | Status: AC
  Filled 2018-10-26: qty 1

## 2018-10-26 MED ORDER — MIDAZOLAM HCL 2 MG/2ML IJ SOLN
INTRAMUSCULAR | Status: AC
  Filled 2018-10-26: qty 2

## 2018-10-26 MED ORDER — FENTANYL CITRATE (PF) 100 MCG/2ML IJ SOLN
INTRAMUSCULAR | Status: AC
  Filled 2018-10-26: qty 2

## 2018-10-26 MED ORDER — ONDANSETRON HCL 4 MG/2ML IJ SOLN
INTRAMUSCULAR | Status: DC | PRN
  Administered 2018-10-26: 4 mg via INTRAVENOUS

## 2018-10-26 MED ORDER — FENTANYL CITRATE (PF) 100 MCG/2ML IJ SOLN
INTRAMUSCULAR | Status: DC | PRN
  Administered 2018-10-26: 50 ug via INTRAVENOUS
  Administered 2018-10-26 (×2): 25 ug via INTRAVENOUS

## 2018-10-26 MED ORDER — DEXAMETHASONE SODIUM PHOSPHATE 10 MG/ML IJ SOLN
INTRAMUSCULAR | Status: DC | PRN
  Administered 2018-10-26: 5 mg via INTRAVENOUS

## 2018-10-26 MED ORDER — SUCCINYLCHOLINE CHLORIDE 20 MG/ML IJ SOLN
INTRAMUSCULAR | Status: AC
  Filled 2018-10-26: qty 1

## 2018-10-26 MED ORDER — VANCOMYCIN HCL IN DEXTROSE 750-5 MG/150ML-% IV SOLN
750.0000 mg | Freq: Two times a day (BID) | INTRAVENOUS | Status: DC
  Administered 2018-10-26 – 2018-10-28 (×4): 750 mg via INTRAVENOUS
  Filled 2018-10-26 (×5): qty 150

## 2018-10-26 MED ORDER — METHADONE HCL 10 MG/ML PO CONC
110.0000 mg | Freq: Every day | ORAL | Status: DC
  Administered 2018-10-26 – 2018-10-30 (×5): 110 mg via ORAL
  Filled 2018-10-26 (×8): qty 11

## 2018-10-26 MED ORDER — PROPOFOL 10 MG/ML IV BOLUS
INTRAVENOUS | Status: DC | PRN
  Administered 2018-10-26: 100 mg via INTRAVENOUS

## 2018-10-26 MED ORDER — VANCOMYCIN HCL 10 G IV SOLR
2000.0000 mg | Freq: Once | INTRAVENOUS | Status: DC
  Filled 2018-10-26: qty 2000

## 2018-10-26 MED ORDER — BUPIVACAINE HCL 0.5 % IJ SOLN
INTRAMUSCULAR | Status: DC | PRN
  Administered 2018-10-26: 10 mL

## 2018-10-26 MED ORDER — EPHEDRINE SULFATE 50 MG/ML IJ SOLN
INTRAMUSCULAR | Status: DC | PRN
  Administered 2018-10-26: 10 mg via INTRAVENOUS

## 2018-10-26 MED ORDER — LIDOCAINE HCL (PF) 2 % IJ SOLN
INTRAMUSCULAR | Status: AC
  Filled 2018-10-26: qty 10

## 2018-10-26 MED ORDER — DEXAMETHASONE SODIUM PHOSPHATE 10 MG/ML IJ SOLN
INTRAMUSCULAR | Status: AC
  Filled 2018-10-26: qty 1

## 2018-10-26 MED ORDER — HYDROMORPHONE HCL 1 MG/ML IJ SOLN: 0.2500 mg | INTRAMUSCULAR | Status: DC | PRN

## 2018-10-26 MED ORDER — PROPOFOL 10 MG/ML IV BOLUS
INTRAVENOUS | Status: AC
  Filled 2018-10-26: qty 20

## 2018-10-26 MED ORDER — VANCOMYCIN HCL 10 G IV SOLR
2000.0000 mg | Freq: Once | INTRAVENOUS | Status: AC
  Administered 2018-10-26: 2000 mg via INTRAVENOUS
  Filled 2018-10-26: qty 2000

## 2018-10-26 MED ORDER — LIDOCAINE HCL (CARDIAC) PF 100 MG/5ML IV SOSY
PREFILLED_SYRINGE | INTRAVENOUS | Status: DC | PRN
  Administered 2018-10-26: 80 mg via INTRAVENOUS

## 2018-10-26 MED ORDER — ONDANSETRON HCL 4 MG/2ML IJ SOLN
INTRAMUSCULAR | Status: AC
  Filled 2018-10-26: qty 2

## 2018-10-26 MED ORDER — GLYCOPYRROLATE 0.2 MG/ML IJ SOLN
INTRAMUSCULAR | Status: DC | PRN
  Administered 2018-10-26: 0.2 mg via INTRAVENOUS

## 2018-10-26 MED ORDER — MIDAZOLAM HCL 2 MG/2ML IJ SOLN
INTRAMUSCULAR | Status: DC | PRN
  Administered 2018-10-26: 2 mg via INTRAVENOUS

## 2018-10-26 MED ORDER — SEVOFLURANE IN SOLN
RESPIRATORY_TRACT | Status: AC
  Filled 2018-10-26: qty 250

## 2018-10-26 MED ORDER — EPHEDRINE SULFATE 50 MG/ML IJ SOLN
INTRAMUSCULAR | Status: AC
  Filled 2018-10-26: qty 1

## 2018-10-26 SURGICAL SUPPLY — 38 items
BANDAGE ELASTIC 4 LF NS (GAUZE/BANDAGES/DRESSINGS) ×2 IMPLANT
BLADE MED AGGRESSIVE (BLADE) ×2 IMPLANT
BLADE SURG 15 STRL LF DISP TIS (BLADE) ×4 IMPLANT
BLADE SURG 15 STRL SS (BLADE) ×8
BNDG CMPR MED 5X4 ELC HKLP NS (GAUZE/BANDAGES/DRESSINGS) ×1
BNDG CONFORM 3 STRL LF (GAUZE/BANDAGES/DRESSINGS) ×2 IMPLANT
BNDG ESMARK 4X12 TAN STRL LF (GAUZE/BANDAGES/DRESSINGS) ×2 IMPLANT
BNDG GAUZE 4.5X4.1 6PLY STRL (MISCELLANEOUS) ×2 IMPLANT
CANISTER SUCT 1200ML W/VALVE (MISCELLANEOUS) ×2 IMPLANT
COVER WAND RF STERILE (DRAPES) ×2 IMPLANT
CUFF TOURN SGL QUICK 12 (TOURNIQUET CUFF) IMPLANT
CUFF TOURN SGL QUICK 18X4 (TOURNIQUET CUFF) IMPLANT
DRAPE FLUOR MINI C-ARM 54X84 (DRAPES) ×2 IMPLANT
DURAPREP 26ML APPLICATOR (WOUND CARE) ×2 IMPLANT
ELECT REM PT RETURN 9FT ADLT (ELECTROSURGICAL) ×2
ELECTRODE REM PT RTRN 9FT ADLT (ELECTROSURGICAL) ×1 IMPLANT
GAUZE SPONGE 4X4 12PLY STRL (GAUZE/BANDAGES/DRESSINGS) ×2 IMPLANT
GAUZE XEROFORM 1X8 LF (GAUZE/BANDAGES/DRESSINGS) ×2 IMPLANT
GLOVE BIO SURGEON STRL SZ8 (GLOVE) ×2 IMPLANT
GLOVE INDICATOR 8.0 STRL GRN (GLOVE) ×2 IMPLANT
GOWN STRL REUS W/ TWL LRG LVL3 (GOWN DISPOSABLE) ×2 IMPLANT
GOWN STRL REUS W/TWL LRG LVL3 (GOWN DISPOSABLE) ×4
KIT TURNOVER KIT A (KITS) ×2 IMPLANT
LABEL OR SOLS (LABEL) ×2 IMPLANT
NDL HYPO 25X1 1.5 SAFETY (NEEDLE) ×3 IMPLANT
NDL SAFETY ECLIPSE 18X1.5 (NEEDLE) ×1 IMPLANT
NEEDLE HYPO 18GX1.5 SHARP (NEEDLE) ×2
NEEDLE HYPO 25X1 1.5 SAFETY (NEEDLE) ×6 IMPLANT
NS IRRIG 500ML POUR BTL (IV SOLUTION) ×2 IMPLANT
PACK EXTREMITY ARMC (MISCELLANEOUS) ×2 IMPLANT
PENCIL ELECTRO HAND CTR (MISCELLANEOUS) ×2 IMPLANT
RASP SM TEAR CROSS CUT (RASP) ×2 IMPLANT
STOCKINETTE STRL 6IN 960660 (GAUZE/BANDAGES/DRESSINGS) ×2 IMPLANT
SUT ETH BLK MONO 3 0 FS 1 12/B (SUTURE) ×2 IMPLANT
SUT ETHILON 5 0 PS 2 18 (SUTURE) ×2 IMPLANT
SUT VIC AB 4-0 FS2 27 (SUTURE) ×2 IMPLANT
SWAB CULTURE AMIES ANAERIB BLU (MISCELLANEOUS) ×2 IMPLANT
SYR 10ML LL (SYRINGE) ×2 IMPLANT

## 2018-10-26 NOTE — Progress Notes (Signed)
2 Days Post-Op   Subjective/Chief Complaint: Notes pain unchanged in left foot. Otherwise without complaint.   Objective: Vital signs in last 24 hours: Temp:  [98.5 F (36.9 C)-98.6 F (37 C)] 98.6 F (37 C) (04/19 0314) Pulse Rate:  [53-65] 64 (04/19 0314) Resp:  [17-20] 20 (04/19 0314) BP: (101-116)/(43-71) 115/50 (04/19 0314) SpO2:  [94 %-95 %] 95 % (04/19 0314)    Intake/Output from previous day: No intake/output data recorded. Intake/Output this shift: No intake/output data recorded.  General appearance: alert and no distress Cardio: regular rate and rhythm Extremities: dressing in place on left foot, edema, eryhtema over lower leg.  Lab Results:  Recent Labs    10/24/18 1606 10/25/18 0523  WBC 9.9 6.8  HGB 10.5* 10.7*  HCT 33.4* 34.5*  PLT 330 312   BMET Recent Labs    10/24/18 1302 10/24/18 1606 10/25/18 0523  NA  --   --  140  K  --   --  3.5  CL  --   --  100  CO2  --   --  31  GLUCOSE  --   --  123*  BUN 11  --  8  CREATININE 0.78 0.73 0.86  CALCIUM  --   --  8.4*   PT/INR No results for input(s): LABPROT, INR in the last 72 hours. ABG No results for input(s): PHART, HCO3 in the last 72 hours.  Invalid input(s): PCO2, PO2  Studies/Results: Mr Foot Left Wo Contrast  Result Date: 10/24/2018 CLINICAL DATA:  Erythematous foot. Discoloration. Ulcerations along the plantar surface. EXAM: MRI OF THE LEFT FOOT WITHOUT CONTRAST TECHNIQUE: Multiplanar, multisequence MR imaging of the left forefoot was performed. No intravenous contrast was administered. COMPARISON:  None. FINDINGS: Patient motion degrades image quality limiting evaluation. Bones/Joint/Cartilage Soft tissue ulcer overlying the fifth MTP joint. Bone destruction of the fifth metatarsal head with marrow edema throughout the fifth metatarsal shaft. Mild bone destruction of the base of the fifth proximal phalanx with associated severe marrow edema. 16 x 4 mm complex fluid collection in the  plantar aspect of forefoot at the level of the mid third metatarsal in the subcutaneous fat most consistent with an abscess which appears to emanate from the lateral soft tissue wound. No other marrow signal abnormality. No acute fracture or dislocation. Ligaments Collateral ligaments are intact.  Lisfranc ligament is intact. Muscles and Tendons Flexor, peroneal and extensor compartment tendons are intact. Diffuse mild T2 hyperintense signal throughout the plantar musculature likely neurogenic. Soft tissue Mild soft tissue edema along the dorsal aspect of the foot which may be reactive versus secondary to mild cellulitis. No soft tissue mass. IMPRESSION: 1. Soft tissue wound along the fifth MTP joint. Osteomyelitis of the fifth metatarsal head with partial osteolysis and severe associated marrow edema. Osteomyelitis of the base of the fifth proximal phalanx. 16 x 4 mm abscess along the plantar aspect of the forefoot at the level of the mid third metatarsal within the subcutaneous fat which emanates from the lateral soft tissue wound. Electronically Signed   By: Kathreen Devoid   On: 10/24/2018 21:07    Anti-infectives: Anti-infectives (From admission, onward)   Start     Dose/Rate Route Frequency Ordered Stop   10/24/18 1800  piperacillin-tazobactam (ZOSYN) IVPB 3.375 g     3.375 g 12.5 mL/hr over 240 Minutes Intravenous Every 8 hours 10/24/18 1503     10/24/18 1600  cephALEXin (KEFLEX) capsule 500 mg  Status:  Discontinued  500 mg Oral 3 times daily 10/24/18 1503 10/24/18 1618   10/24/18 1300  ceFAZolin (ANCEF) IVPB 2g/100 mL premix  Status:  Discontinued     2 g 200 mL/hr over 30 Minutes Intravenous  Once 10/24/18 1252 10/24/18 1503   10/24/18 1237  ceFAZolin (ANCEF) 2-4 GM/100ML-% IVPB    Note to Pharmacy:  Despina Arias  : cabinet override      10/24/18 1237 10/24/18 1350      Assessment/Plan: s/p Procedure(s): LOWER EXTREMITY ANGIOGRAPHY (Left) Plan for Ray amputation today per  Podiatry  Continue ABX Continue pain control  LOS: 2 days    Jamesetta So A 10/26/2018

## 2018-10-26 NOTE — Progress Notes (Signed)
Pharmacy Antibiotic Note  Yesenia Shepard is a 68 y.o. female admitted on 10/24/2018 with osteomyelitis.  Pharmacy has been consulted for vancomycin dosing.  Plan: Vancomycin 2000mg  loading dose Vancomycin 750 mg IV Q12 hrs. Goal AUC 400-550. Expected AUC:528.9 SCr used: 0.86   Height: 5\' 5"  (165.1 cm) Weight: 170 lb (77.1 kg) IBW/kg (Calculated) : 57  Temp (24hrs), Avg:98.4 F (36.9 C), Min:97.9 F (36.6 C), Max:98.6 F (37 C)  Recent Labs  Lab 10/24/18 1302 10/24/18 1606 10/25/18 0523  WBC  --  9.9 6.8  CREATININE 0.78 0.73 0.86    Estimated Creatinine Clearance: 64.2 mL/min (by C-G formula based on SCr of 0.86 mg/dL).    No Known Allergies  Antimicrobials this admission: 0417 Zosyn >>  0419 vancomycin >>   Microbiology results: 0419 Surgical/deep wound culture: pending 0418 MRSA PCR: negative  Thank you for allowing pharmacy to be a part of this patient's care.  Khrystian Schauf 10/26/2018 10:29 AM

## 2018-10-26 NOTE — Anesthesia Procedure Notes (Signed)
Procedure Name: LMA Insertion Date/Time: 10/26/2018 8:14 AM Performed by: Jonna Clark, CRNA Pre-anesthesia Checklist: Patient identified, Patient being monitored, Timeout performed, Emergency Drugs available and Suction available Patient Re-evaluated:Patient Re-evaluated prior to induction Oxygen Delivery Method: Circle system utilized Preoxygenation: Pre-oxygenation with 100% oxygen Induction Type: IV induction Ventilation: Mask ventilation without difficulty LMA: LMA inserted LMA Size: 3.0 Tube type: Oral Number of attempts: 1 Placement Confirmation: positive ETCO2 and breath sounds checked- equal and bilateral Tube secured with: Tape Dental Injury: Dental damage  Comments: Pt with very loose teeth to begin with. Was told that she could lose them. Lower left eye tooth came out with LMA insertion. MDA will discuss with pt when she is awake

## 2018-10-26 NOTE — Transfer of Care (Signed)
Immediate Anesthesia Transfer of Care Note  Patient: Yesenia Shepard  Procedure(s) Performed: 5TH RAY RESCTION (Left Foot)  Patient Location: PACU  Anesthesia Type:General  Level of Consciousness: drowsy and patient cooperative  Airway & Oxygen Therapy: Patient Spontanous Breathing and Patient connected to face mask oxygen  Post-op Assessment: Report given to RN and Post -op Vital signs reviewed and stable  Post vital signs: Reviewed and stable  Last Vitals:  Vitals Value Taken Time  BP 108/44 10/26/2018  9:10 AM  Temp    Pulse 67 10/26/2018  9:11 AM  Resp 18 10/26/2018  9:11 AM  SpO2 100 % 10/26/2018  9:11 AM  Vitals shown include unvalidated device data.  Last Pain:  Vitals:   10/26/18 0720  TempSrc:   PainSc: 4       Patients Stated Pain Goal: 1 (31/25/08 7199)  Complications: No apparent anesthesia complications

## 2018-10-26 NOTE — Op Note (Signed)
Operative note   Surgeon: Dr. Albertine Patricia, DPM.    Assistant: None    Preop diagnosis: Osteomyelitis fifth ray left foot with cellulitis and abscess left foot    Postop diagnosis: Same    Procedure:   1.  Fifth ray amputation left foot   2.  I&D abscess plantar deep space left foot        EBL: 30 cc    Anesthesia:general delivered by the anesthesia team.  I injected 10 cc 0.5% Marcaine plain preoperatively as well at the base of the operative site.    Hemostasis: Ankle tourniquet at 2 and 25 mils mercury pressure for 15 minutes    Specimen: 1.  Amputated fifth toe and fifth ray with the proximal margins marked to evaluate for clear margins regarding infection. 2.  Culture and sensitivity from distal fifth metatarsal shaft and head    Complications: None    Operative indications: Chronic ulceration plantar left foot with secondary osteomyelitis and infection of soft tissue as well.  Patient has a known history of peripheral arterial disease.  She underwent an angioplasty on Friday to the left lower extremity    Procedure:  Patient was brought into the OR and placed on the operating table in thesupine position. After anesthesia was obtained theleft lower extremity was prepped and draped in usual sterile fashion.  Operative Report: At this time attention directed to the left foot where a large area of necrotic tissue ulceration was noted on the plantar lateral fifth metatarsal region.  2 semielliptical incisions were made that went around the base of the fifth toe and was used to excise the necrotic tissue and ulceration.  This was carried down to bone and the fifth toe was amputated by releasing the tendon and capsular tissues around the MTP joint.  This point it was seen that there was severe necrosis to the fifth metatarsal head this was cultured for aerobic and anaerobic cultures of the bone.  The metatarsal was then osteotomized at the proximal shaft at a clear margin.  This  area was marked for pathologic evaluation for margin clearance and was sent to pathology. This point there was some necrotic tendon plantar lateral that followed until normal tendon was encountered and I resected it.  All soft tissue necrosis and infection was then debrided with a versa jet.  2 areas of potential sinus tracking were noted one was superficial to the plantar fascial ligament there was some necrotic tissue in this region and a mild area of purulence.  The purulence was drained the area was debrided with a versa jet to remove all necrotic infected soft tissue.  Another area was just dorsal to this in between the plantar fascia and the deeper musculature.  This was opened up and probed and minimal purulence was noted to be removed from this area.  This area was also irrigated copiously and lightly debrided and irrigated with the versa jet system on a level 1. At this point the area was copiously irrigated and the deeper wound spaces were packed with iodoform gauze.  There is a separate opening on the plantar aspect of the foot and pulled the iodoform gauze out plantarly.  Another portion was left medially in the wound.  The skin was closed at this point utilizing 3-0 nylon simple interrupted sutures.  Small area was left open at the packing site. At this point a sterile dressing was placed across the area consisting of Xeroform gauze 4 x 4's ABD pads Kerlix  and an Ace wrap.    Patient tolerated the procedure and anesthesia well.  Was transported from the OR to the PACU with all vital signs stable and vascular status intact. To be discharged per routine protocol.  Will follow up in approximately 1 week in the outpatient clinic.

## 2018-10-26 NOTE — Anesthesia Postprocedure Evaluation (Signed)
Anesthesia Post Note  Patient: Yesenia Shepard  Procedure(s) Performed: 5TH RAY RESCTION (Left Foot)  Patient location during evaluation: PACU Anesthesia Type: General Level of consciousness: awake and alert Pain management: pain level controlled Vital Signs Assessment: post-procedure vital signs reviewed and stable Respiratory status: spontaneous breathing, nonlabored ventilation and respiratory function stable Cardiovascular status: blood pressure returned to baseline and stable Postop Assessment: no apparent nausea or vomiting Anesthetic complications: no     Last Vitals:  Vitals:   10/26/18 1459 10/26/18 1613  BP: (!) 105/50 (!) 109/53  Pulse: (!) 55 (!) 103  Resp:    Temp: 36.9 C 36.8 C  SpO2: 96% 93%    Last Pain:  Vitals:   10/26/18 1831  TempSrc:   PainSc: 10-Worst pain ever                 Durenda Hurt

## 2018-10-26 NOTE — Anesthesia Post-op Follow-up Note (Signed)
Anesthesia QCDR form completed.        

## 2018-10-27 ENCOUNTER — Encounter: Payer: Self-pay | Admitting: Vascular Surgery

## 2018-10-27 ENCOUNTER — Other Ambulatory Visit (INDEPENDENT_AMBULATORY_CARE_PROVIDER_SITE_OTHER): Payer: Self-pay | Admitting: Nurse Practitioner

## 2018-10-27 DIAGNOSIS — L02612 Cutaneous abscess of left foot: Secondary | ICD-10-CM

## 2018-10-27 DIAGNOSIS — L97528 Non-pressure chronic ulcer of other part of left foot with other specified severity: Secondary | ICD-10-CM

## 2018-10-27 DIAGNOSIS — E1169 Type 2 diabetes mellitus with other specified complication: Secondary | ICD-10-CM

## 2018-10-27 DIAGNOSIS — F17201 Nicotine dependence, unspecified, in remission: Secondary | ICD-10-CM

## 2018-10-27 DIAGNOSIS — Z79899 Other long term (current) drug therapy: Secondary | ICD-10-CM

## 2018-10-27 DIAGNOSIS — Z95820 Peripheral vascular angioplasty status with implants and grafts: Secondary | ICD-10-CM

## 2018-10-27 DIAGNOSIS — Z8619 Personal history of other infectious and parasitic diseases: Secondary | ICD-10-CM

## 2018-10-27 DIAGNOSIS — Z7902 Long term (current) use of antithrombotics/antiplatelets: Secondary | ICD-10-CM

## 2018-10-27 DIAGNOSIS — L089 Local infection of the skin and subcutaneous tissue, unspecified: Secondary | ICD-10-CM

## 2018-10-27 DIAGNOSIS — E11621 Type 2 diabetes mellitus with foot ulcer: Secondary | ICD-10-CM

## 2018-10-27 DIAGNOSIS — Z9889 Other specified postprocedural states: Secondary | ICD-10-CM

## 2018-10-27 DIAGNOSIS — E11628 Type 2 diabetes mellitus with other skin complications: Secondary | ICD-10-CM

## 2018-10-27 DIAGNOSIS — I96 Gangrene, not elsewhere classified: Secondary | ICD-10-CM

## 2018-10-27 DIAGNOSIS — Z89422 Acquired absence of other left toe(s): Secondary | ICD-10-CM

## 2018-10-27 DIAGNOSIS — I10 Essential (primary) hypertension: Secondary | ICD-10-CM

## 2018-10-27 DIAGNOSIS — E1152 Type 2 diabetes mellitus with diabetic peripheral angiopathy with gangrene: Secondary | ICD-10-CM

## 2018-10-27 DIAGNOSIS — Z7982 Long term (current) use of aspirin: Secondary | ICD-10-CM

## 2018-10-27 DIAGNOSIS — M869 Osteomyelitis, unspecified: Secondary | ICD-10-CM

## 2018-10-27 MED ORDER — SODIUM CHLORIDE 0.9 % IV SOLN
INTRAVENOUS | Status: DC | PRN
  Administered 2018-10-27: 12:00:00 250 mL via INTRAVENOUS

## 2018-10-27 NOTE — Progress Notes (Signed)
ID Full consult note to follow Pt seen with Dr.Troxler DFi with gangrenous infection with osteo Underwent  5th ray excision and I/D of plantar abscess .awaiting culture on vanco and zosyn. culture and pathology pending for final antibiotic recommendation

## 2018-10-27 NOTE — Progress Notes (Signed)
PT Cancellation Note  Patient Details Name: RADIANCE DEADY MRN: [id number] DOB: 05-06-1951   Cancelled Treatment:    Reason Eval/Treat Not Completed: Other (comment) Upon PT arrival to the floor, RN informed PT that OrthoWedge shoe had not yet arrived; chart reviewed. PT will attempt evaluation/treatment for education of precautions, DME use, and all other needs at a later time/date.   Myles Gip PT, DPT [phone] 10/27/2018, 2:24 PM

## 2018-10-27 NOTE — Progress Notes (Signed)
[facility]  Daily Progress Note   Subjective  - 1 Day Post-Op  Foot feels some better.  Had surgery with Podiatry yesterday. Abscess was fairly extensive but well drained. Foot wrapped. No events overnight.  Podiatry put in ID consult for today.  Objective Vitals:   10/26/18 1613 10/26/18 1945 10/27/18 0530 10/27/18 1127  BP: (!) 109/53 (!) 102/53 (!) 109/53 (!) 104/51  Pulse: (!) 103 (!) 54 (!) 55 61  Resp:  16 16 18   Temp: 98.2 F (36.8 C) 98.5 F (36.9 C) 97.8 F (36.6 C) 98.4 F (36.9 C)  TempSrc: Oral   Axillary  SpO2: 93% 93% 98% 90%  Weight:   68.7 kg   Height:        Intake/Output Summary (Last 24 hours) at 10/27/2018 1338 Last data filed at 10/27/2018 1330 Gross per 24 hour  Intake 1108.68 ml  Output -  Net 1108.68 ml    PULM  CTAB CV  RRR VASC  Toes with good cap refill.  Bulky dressing on so can't feel pulses well.  Access site is C/D/I  Laboratory CBC    Component Value Date/Time   WBC 6.8 10/25/2018 0523   HGB 10.7 (L) 10/25/2018 0523   HCT 34.5 (L) 10/25/2018 0523   PLT 312 10/25/2018 0523    BMET    Component Value Date/Time   NA 140 10/25/2018 0523   NA 139 02/04/2015 1305   K 3.5 10/25/2018 0523   CL 100 10/25/2018 0523   CO2 31 10/25/2018 0523   GLUCOSE 123 (H) 10/25/2018 0523   BUN 8 10/25/2018 0523   BUN 10 02/04/2015 1305   CREATININE 0.86 10/25/2018 0523   CREATININE 0.76 01/05/2016 1524   CALCIUM 8.4 (L) 10/25/2018 0523   GFRNONAA >60 10/25/2018 0523   GFRNONAA 83 01/05/2016 1524   GFRAA >60 10/25/2018 0523   GFRAA >89 01/05/2016 1524    Assessment/Planning: POD #3 s/p LLE revascularization   Had Left fifth Ray and drainage of abscess by Podiatry yesterday  Doing fairly well  Wrap intact  ID consulted for questions on outpatient ABx  Possible discharge home tomorrow if stable otherwise.  Stay on ASA/Plavix/Statin at discharge for at least 90 days.    Leotis Pain  10/27/2018, 1:38  PM

## 2018-10-27 NOTE — Consult Note (Addendum)
NAME: Yesenia Shepard  DOB: [birth year]  MRN: [id number]  Date/Time: 10/27/2018 10:40 PM  REQUESTING PROVIDER:troxler Subjective:  REASON FOR CONSULT: left foot infection ? Yesenia Shepard is a 68 y.o. female with a history of PVD, HTN, Chronic left foot wound was admitted on 10/24/18 for worsening ulceration and infection of the left foot and underwent angiogram and Percutanoeus transluminal angioplasty of left SFA, TPA and stent placement of left SFA.she was started on zosyn. She was seen by podiatrist for the necrotic  infection and underwent left 5th ray amputation and I/D of plantar abscess on 10/26/18. She was also started on vanco by podiatrist. Bone culture and pathology was sent.I am asked to see the patient for antibiotic recommendation. She has no fever, she has severe pain in the leg  Past Medical History:  Diagnosis Date  . Anxiety   . Arthritis   . Chronic pain   . Depression   . Drug abuse ([place])    History of polysubstance abuse; currently on methadone.  . Hepatitis    History of Hep "C". treated and cured with Harvoni  . History of chicken pox   . Hypertension   . Panic attacks   . Peripheral vascular disease ([place])    stent in place.   . Pre-diabetes   . UTI (urinary tract infection)    History of    Past Surgical History:  Procedure Laterality Date  . ABDOMINAL HYSTERECTOMY  1989  . AMPUTATION TOE Left 10/26/2018   Procedure: 5TH RAY RESCTION;  Surgeon: Albertine Patricia, DPM;  Location: [facility] ORS;  Service: Podiatry;  Laterality: Left;  . CHOLECYSTECTOMY  1987  . INTRACAPSULAR CATARACT EXTRACTION Left   . LOWER EXTREMITY ANGIOGRAPHY Left 12/17/2017   Procedure: LOWER EXTREMITY ANGIOGRAPHY;  Surgeon: Katha Cabal, MD;  Location: [facility];  Service: Cardiovascular;  Laterality: Left;  . LOWER EXTREMITY ANGIOGRAPHY Left 10/24/2018   Procedure: LOWER EXTREMITY ANGIOGRAPHY;  Surgeon: Algernon Huxley, MD;  Location: [facility];  Service: Cardiovascular;   Laterality: Left;  . PERIPHERAL VASCULAR CATHETERIZATION Left 05/04/2015   Procedure: Lower Extremity Angiography;  Surgeon: Katha Cabal, MD;  Location: [facility];  Service: Cardiovascular;  Laterality: Left;  . PERIPHERAL VASCULAR CATHETERIZATION Left 05/04/2015   Procedure: Lower Extremity Intervention;  Surgeon: Katha Cabal, MD;  Location: [facility];  Service: Cardiovascular;  Laterality: Left;  . SHOULDER ARTHROSCOPY WITH OPEN ROTATOR CUFF REPAIR Right 01/16/2017   Procedure: SHOULDER ARTHROSCOPY WITH OPEN ROTATOR CUFF REPAIR;  Surgeon: Thornton Park, MD;  Location: [facility];  Service: Orthopedics;  Laterality: Right;  . [place]     [place] Lives with her husband Has a dog at home Current smoker- quit 2 weeks ago. Ex IVDA  Family History  Problem Relation Age of Onset  . Arthritis Mother   . Mental illness Mother        depression  . Diabetes Mother   . Cancer Mother        pancreatic  . Cancer Father        colon  . Heart disease Father   . Diabetes Father   . Cancer Daughter        lung   No Known Allergies ? Current Facility-Administered Medications  Medication Dose Route Frequency Provider Last Rate Last Dose  . 0.9 %  sodium chloride infusion   Intravenous PRN Algernon Huxley, MD   Stopped at 10/27/18 1259  .  acetaminophen (TYLENOL) tablet 650 mg  650 mg Oral Q6H PRN Algernon Huxley, MD       Or  . acetaminophen (TYLENOL) suppository 650 mg  650 mg Rectal Q6H PRN Algernon Huxley, MD      . aspirin EC tablet 81 mg  81 mg Oral Daily Algernon Huxley, MD   81 mg at 10/27/18 1130  . atorvastatin (LIPITOR) tablet 40 mg  40 mg Oral Daily Algernon Huxley, MD   40 mg at 10/27/18 1130  . clonazePAM (KLONOPIN) tablet 2 mg  2 mg Oral BID Algernon Huxley, MD   2 mg at 10/27/18 2052  . clopidogrel (PLAVIX) tablet 75 mg  75 mg Oral Daily Algernon Huxley, MD   75 mg at 10/27/18 1130  . docusate sodium (COLACE) capsule 200 mg  200 mg Oral BID  Algernon Huxley, MD   200 mg at 10/27/18 2052  . heparin injection 5,000 Units  5,000 Units Subcutaneous Q8H Algernon Huxley, MD   5,000 Units at 10/27/18 2052  . HYDROcodone-acetaminophen (NORCO/VICODIN) 5-325 MG per tablet 1-2 tablet  1-2 tablet Oral Q6H PRN Algernon Huxley, MD   1 tablet at 10/27/18 1131  . lisinopril (ZESTRIL) tablet 10 mg  10 mg Oral Daily Algernon Huxley, MD   10 mg at 10/26/18 1153  . magnesium hydroxide (MILK OF MAGNESIA) suspension 30 mL  30 mL Oral Daily PRN Algernon Huxley, MD      . meloxicam (MOBIC) tablet 15 mg  15 mg Oral Daily PRN Algernon Huxley, MD      . methadone (DOLOPHINE) 10 MG/ML solution 110 mg  110 mg Oral Daily Algernon Huxley, MD   110 mg at 10/27/18 1147  . ondansetron (ZOFRAN) tablet 4 mg  4 mg Oral Q6H PRN Algernon Huxley, MD       Or  . ondansetron (ZOFRAN) injection 4 mg  4 mg Intravenous Q6H PRN Algernon Huxley, MD      . piperacillin-tazobactam (ZOSYN) IVPB 3.375 g  3.375 g Intravenous Q8H Algernon Huxley, MD 12.5 mL/hr at 10/27/18 2052 3.375 g at 10/27/18 2052  . polyethylene glycol (MIRALAX / GLYCOLAX) packet 17 g  17 g Oral Daily PRN Algernon Huxley, MD      . sodium phosphate (FLEET) 7-19 GM/118ML enema 1 enema  1 enema Rectal Once PRN Lucky Cowboy, Erskine Squibb, MD      . sorbitol 70 % solution 30 mL  30 mL Oral Daily PRN Algernon Huxley, MD      . vancomycin (VANCOCIN) IVPB 750 mg/150 ml premix  750 mg Intravenous Q12H Albertine Patricia, DPM   Stopped at 10/27/18 1251   Facility-Administered Medications Ordered in Other Encounters  Medication Dose Route Frequency Provider Last Rate Last Dose  . ceFAZolin (ANCEF) IVPB 2g/100 mL premix  2 g Intravenous Once Kris Hartmann, NP         Abtx:  Anti-infectives (From admission, onward)   Start     Dose/Rate Route Frequency Ordered Stop   10/27/18 0000  vancomycin (VANCOCIN) IVPB 750 mg/150 ml premix     750 mg 150 mL/hr over 60 Minutes Intravenous Every 12 hours 10/26/18 1400     10/26/18 1030  vancomycin (VANCOCIN) 2,000 mg in sodium  chloride 0.9 % 500 mL IVPB     2,000 mg 250 mL/hr over 120 Minutes Intravenous  Once 10/26/18 1017 10/26/18 1402   10/26/18 1015  vancomycin (  VANCOCIN) 2,000 mg in sodium chloride 0.9 % 500 mL IVPB  Status:  Discontinued     2,000 mg 250 mL/hr over 120 Minutes Intravenous  Once 10/26/18 1014 10/26/18 1016   10/24/18 1800  piperacillin-tazobactam (ZOSYN) IVPB 3.375 g     3.375 g 12.5 mL/hr over 240 Minutes Intravenous Every 8 hours 10/24/18 1503     10/24/18 1600  cephALEXin (KEFLEX) capsule 500 mg  Status:  Discontinued     500 mg Oral 3 times daily 10/24/18 1503 10/24/18 1618   10/24/18 1300  ceFAZolin (ANCEF) IVPB 2g/100 mL premix  Status:  Discontinued     2 g 200 mL/hr over 30 Minutes Intravenous  Once 10/24/18 1252 10/24/18 1503   10/24/18 1237  ceFAZolin (ANCEF) 2-4 GM/100ML-% IVPB    Note to Pharmacy:  Despina Arias  : cabinet override      10/24/18 1237 10/24/18 1350      REVIEW OF SYSTEMS:  Const: negative fever, negative chills, negative weight loss Eyes: negative diplopia or visual changes, negative eye pain ENT: negative coryza, negative sore throat Resp: negative cough, hemoptysis, dyspnea Cards: negative for chest pain, palpitations, lower extremity edema GU: negative for frequency, dysuria and hematuria GI: Negative for abdominal pain, diarrhea, bleeding, constipation Skin: negative for rash and pruritus Heme: negative for easy bruising and gum/nose bleeding MS: pain both legs- left > rt  Neurolo:negative for headaches, dizziness, vertigo, memory problems  Psych: negative for feelings of anxiety, depression  Endocrine: diabetes  Allergy/Immunology- negative for any medication or food allergies ? Objective:  VITALS:  BP (!) 97/53 (BP Location: Left Arm)   Pulse (!) 53   Temp 98.8 F (37.1 C) (Oral)   Resp 16   Ht 5\' 5"  (1.651 m)   Wt 68.7 kg   SpO2 93%   BMI 25.20 kg/m  PHYSICAL EXAM:  General: Alert, cooperative, in some distress, appears stated  age.  Head: Normocephalic, without obvious abnormality, atraumatic. Eyes: Conjunctivae clear, anicteric sclerae. Pupils are equal ENT Nares normal. No drainage or sinus tenderness. Lips, mucosa, and tongue normal. No Thrush Neck: Supple, symmetrical, no adenopathy, thyroid: non tender no carotid bruit and no JVD. Back: No CVA tenderness. Lungs: Clear to auscultation bilaterally. No Wheezing or Rhonchi. No rales. Heart: Regular rate and rhythm, no murmur, rub or gallop. Abdomen: Soft, non-tender,not distended. Bowel sounds normal. No masses Extremities: left foot- 5th ray excision-Skin:       No rashes or lesions. Or bruising Lymph: Cervical, supraclavicular normal. Neurologic: Grossly non-focal Pertinent Labs Lab Results CBC    Component Value Date/Time   WBC 6.8 10/25/2018 0523   RBC 3.80 (L) 10/25/2018 0523   HGB 10.7 (L) 10/25/2018 0523   HCT 34.5 (L) 10/25/2018 0523   PLT 312 10/25/2018 0523   MCV 90.8 10/25/2018 0523   MCH 28.2 10/25/2018 0523   MCHC 31.0 10/25/2018 0523   RDW 13.0 10/25/2018 0523   LYMPHSABS 1.7 04/10/2018 1428   MONOABS 0.3 04/10/2018 1428   EOSABS 0.2 04/10/2018 1428   BASOSABS 0.1 04/10/2018 1428    CMP Latest Ref Rng & Units 10/25/2018 10/24/2018 10/24/2018  Glucose 70 - 99 mg/dL 123(H) - -  BUN 8 - 23 mg/dL 8 - 11  Creatinine 0.44 - 1.00 mg/dL 0.86 0.73 0.78  Sodium 135 - 145 mmol/L 140 - -  Potassium 3.5 - 5.1 mmol/L 3.5 - -  Chloride 98 - 111 mmol/L 100 - -  CO2 22 - 32 mmol/L 31 - -  Calcium 8.9 - 10.3 mg/dL 8.4(L) - -  Total Protein 6.0 - 8.3 g/dL - - -  Total Bilirubin 0.2 - 1.2 mg/dL - - -  Alkaline Phos 39 - 117 U/L - - -  AST 0 - 37 U/L - - -  ALT 0 - 35 U/L - - -      Microbiology: Recent Results (from the past 240 hour(s))  Surgical pcr screen     Status: None   Collection Time: 10/25/18  3:49 AM  Result Value Ref Range Status   MRSA, PCR NEGATIVE NEGATIVE Final   Staphylococcus aureus NEGATIVE NEGATIVE Final     Comment: (NOTE) The Xpert SA Assay (FDA approved for NASAL specimens in patients 8 years of age and older), is one component of a comprehensive surveillance program. It is not intended to diagnose infection nor to guide or monitor treatment. Performed at [facility], [address] [place], [place] [address]   Aerobic/Anaerobic Culture (surgical/deep wound)     Status: None (Preliminary result)   Collection Time: 10/26/18  8:29 AM  Result Value Ref Range Status   Specimen Description   Final    BONE 5TH METATARSAL Performed at [facility], [address], [place], [place] [address]    Special Requests   Final    NONE Performed at [facility], [address] [place], [place] [address]    Gram Stain   Final    MODERATE WBC PRESENT, PREDOMINANTLY PMN FEW GRAM POSITIVE COCCI    Culture   Final    CULTURE REINCUBATED FOR BETTER GROWTH Performed at [facility], [place] [address] [place], [place] [address]    Report Status PENDING  Incomplete  Aerobic/Anaerobic Culture (surgical/deep wound)     Status: None (Preliminary result)   Collection Time: 10/26/18  8:33 AM  Result Value Ref Range Status   Specimen Description   Final    BONE 5TH METATARSAL Performed at [facility], [place] [address] [place], [place] [address]    Special Requests   Final    NONE Performed at [facility], [place], [place] [address]    Gram Stain   Final    ABUNDANT WBC PRESENT, PREDOMINANTLY PMN FEW GRAM POSITIVE COCCI    Culture   Final    CULTURE REINCUBATED FOR BETTER GROWTH Performed at [facility], [place] [address] [place], [place] [address]    Report Status PENDING  Incomplete    IMAGING RESULTS: Soft tissue wound along the fifth MTP joint. Osteomyelitis of the fifth metatarsal head with partial osteolysis and severe associated marrow edema. Osteomyelitis of the base of the fifth proximal phalanx. 16 x 4 mm abscess  along the plantar aspect of the forefoot at the level of the mid third metatarsal within the subcutaneous fat which emanates from the lateral soft tissue wound. I have personally reviewed the films ? Impression/Recommendation ?68 y.o. female with a history of PVD, HTN, Chronic left foot wound was admitted on 10/24/18 for worsening ulceration and infection of the left foot ? Peripheral vascular disease with severe ulceration and necrosis of the left foot 5th toe area-with osteomyelitis- s/p angio, had stent in left SFA and balloon angioplasty of TPA, post tibial and SFA   s/p 5th ray amputation and I/D of the foot abscess. Bone culture pending-concern for osteomyelitis Currently on vanco and zosyn-once culture finalizes can direct home antibiotics- very likely will not need IV  antibiotic on discharge  DM- not on any meds-last Hba1c from 04/10/18 was 6.3  Treated hepatitis C infection  pt seen with Dr.Troxler Discussed the management with the patient

## 2018-10-27 NOTE — Progress Notes (Signed)
[facility] Podiatry                                                      Patient Demographics  Yesenia Shepard, is a 68 y.o. female   MRN: [id number]   DOB - [birth year]  Admit Date - 10/24/2018    Outpatient Primary MD for the patient is [name], [name], MD  Consult requested in the Hospital by Algernon Huxley, MD, On 10/27/2018     With History of -  Past Medical History:  Diagnosis Date  . Anxiety   . Arthritis   . Chronic pain   . Depression   . Drug abuse ([place])    History of polysubstance abuse; currently on methadone.  . Hepatitis    History of Hep "C". treated and cured with Harvoni  . History of chicken pox   . Hypertension   . Panic attacks   . Peripheral vascular disease ([place])    stent in place.   . Pre-diabetes   . UTI (urinary tract infection)    History of      Past Surgical History:  Procedure Laterality Date  . ABDOMINAL HYSTERECTOMY  1989  . AMPUTATION TOE Left 10/26/2018   Procedure: 5TH RAY RESCTION;  Surgeon: Albertine Patricia, DPM;  Location: [facility] ORS;  Service: Podiatry;  Laterality: Left;  . CHOLECYSTECTOMY  1987  . INTRACAPSULAR CATARACT EXTRACTION Left   . LOWER EXTREMITY ANGIOGRAPHY Left 12/17/2017   Procedure: LOWER EXTREMITY ANGIOGRAPHY;  Surgeon: Katha Cabal, MD;  Location: [place] [facility];  Service: Cardiovascular;  Laterality: Left;  . LOWER EXTREMITY ANGIOGRAPHY Left 10/24/2018   Procedure: LOWER EXTREMITY ANGIOGRAPHY;  Surgeon: Algernon Huxley, MD;  Location: [place] [facility];  Service: Cardiovascular;  Laterality: Left;  . PERIPHERAL VASCULAR CATHETERIZATION Left 05/04/2015   Procedure: Lower Extremity Angiography;  Surgeon: Katha Cabal, MD;  Location: [place] [facility];  Service: Cardiovascular;  Laterality: Left;  . PERIPHERAL VASCULAR CATHETERIZATION Left 05/04/2015   Procedure: Lower  Extremity Intervention;  Surgeon: Katha Cabal, MD;  Location: [place] [facility];  Service: Cardiovascular;  Laterality: Left;  . SHOULDER ARTHROSCOPY WITH OPEN ROTATOR CUFF REPAIR Right 01/16/2017   Procedure: SHOULDER ARTHROSCOPY WITH OPEN ROTATOR CUFF REPAIR;  Surgeon: Thornton Park, MD;  Location: [facility] ORS;  Service: Orthopedics;  Laterality: Right;  . TONSILLECTOMY AND ADENOIDECTOMY  1971    in for   No chief complaint on file.    HPI  Yesenia Shepard  is a 68 y.o. female, 1 day status post fifth ray resection due to osteomyelitis and cellulitis and abscess    Review of Systems    In addition to the HPI above,  No Fever-chills, No Headache, No changes with Vision or hearing, No problems swallowing food or Liquids, No Chest pain, Cough or Shortness of Breath, No Abdominal pain, No Nausea or Vommitting, Bowel movements are regular, No Blood in stool or Urine, No dysuria, No new skin rashes or bruises, No new joints pains-aches,  No new weakness, tingling, numbness in any extremity, No recent weight gain or loss, No polyuria, polydypsia or polyphagia, No significant Mental Stressors.  A full 10 point Review of Systems was done, except as stated above, all other Review of Systems were negative.   Social History Social History   Tobacco  Use  . Smoking status: Former Smoker    Packs/day: 0.50    Years: 30.00    Pack years: 15.00    Types: Cigarettes    Last attempt to quit: 07/25/2018    Years since quitting: 0.2  . Smokeless tobacco: Never Used  . Tobacco comment: Currently smoking 1/2 ppd  Substance Use Topics  . Alcohol use: No    Alcohol/week: 0.0 standard drinks    Family History Family History  Problem Relation Age of Onset  . Arthritis Mother   . Mental illness Mother        depression  . Diabetes Mother   . Cancer Mother        pancreatic  . Cancer Father        colon  . Heart disease Father   . Diabetes Father   . Cancer Daughter         lung    Prior to Admission medications   Medication Sig Start Date End Date Taking? Authorizing Provider  aspirin 81 MG tablet Take 81 mg by mouth daily.   Yes [provider]  atorvastatin (LIPITOR) 40 MG tablet Take 1 tablet (40 mg total) by mouth daily. Patient taking differently: Take 40 mg by mouth every other day.  04/10/18  Yes [name], [name], MD  cephALEXin (KEFLEX) 500 MG capsule Take 1 capsule (500 mg total) by mouth 3 (three) times daily. 10/21/18  Yes Kris Hartmann, NP  clonazePAM (KLONOPIN) 2 MG tablet Take 2 mg by mouth 2 (two) times daily.  12/08/16  Yes [provider]  methadone (DOLOPHINE) 10 MG/5ML solution Take 110 mg by mouth daily.    Yes [provider]  clopidogrel (PLAVIX) 75 MG tablet TAKE 1 TABLET BY MOUTH ONCE DAILY Patient not taking: No sig reported 12/23/17   Schnier, Dolores Lory, MD  docusate sodium (COLACE) 100 MG capsule Take 200 mg by mouth 2 (two) times daily.     [provider]  furosemide (LASIX) 20 MG tablet Take 1 tablet (20 mg total) by mouth daily. In am Patient not taking: Reported on 10/03/2018 04/10/18   [name], [name], MD  HYDROcodone-acetaminophen (NORCO) 5-325 MG tablet Take 1-2 tablets by mouth every 6 (six) hours as needed for moderate pain or severe pain. Patient not taking: Reported on 06/02/2018 02/27/18   Schnier, Dolores Lory, MD  lisinopril (PRINIVIL,ZESTRIL) 10 MG tablet Take 1 tablet (10 mg total) by mouth daily. Patient not taking: Reported on 10/24/2018 04/23/18   [name], [name], MD  meloxicam (MOBIC) 15 MG tablet Take 1 tablet (15 mg total) by mouth daily as needed for pain. Patient not taking: Reported on 06/02/2018 04/23/18   [name], [name], MD  polyethylene glycol (MIRALAX / GLYCOLAX) packet Take 17 g by mouth daily as needed for mild constipation.    [provider]    Anti-infectives (From admission, onward)   Start     Dose/Rate Route Frequency Ordered  Stop   10/27/18 0000  vancomycin (VANCOCIN) IVPB 750 mg/150 ml premix     750 mg 150 mL/hr over 60 Minutes Intravenous Every 12 hours 10/26/18 1400     10/26/18 1030  vancomycin (VANCOCIN) 2,000 mg in sodium chloride 0.9 % 500 mL IVPB     2,000 mg 250 mL/hr over 120 Minutes Intravenous  Once 10/26/18 1017 10/26/18 1402   10/26/18 1015  vancomycin (VANCOCIN) 2,000 mg in sodium chloride 0.9 % 500 mL IVPB  Status:  Discontinued  2,000 mg 250 mL/hr over 120 Minutes Intravenous  Once 10/26/18 1014 10/26/18 1016   10/24/18 1800  piperacillin-tazobactam (ZOSYN) IVPB 3.375 g     3.375 g 12.5 mL/hr over 240 Minutes Intravenous Every 8 hours 10/24/18 1503     10/24/18 1600  cephALEXin (KEFLEX) capsule 500 mg  Status:  Discontinued     500 mg Oral 3 times daily 10/24/18 1503 10/24/18 1618   10/24/18 1300  ceFAZolin (ANCEF) IVPB 2g/100 mL premix  Status:  Discontinued     2 g 200 mL/hr over 30 Minutes Intravenous  Once 10/24/18 1252 10/24/18 1503   10/24/18 1237  ceFAZolin (ANCEF) 2-4 GM/100ML-% IVPB    Note to Pharmacy:  Despina Arias  : cabinet override      10/24/18 1237 10/24/18 1350      Scheduled Meds: . aspirin EC  81 mg Oral Daily  . atorvastatin  40 mg Oral Daily  . clonazePAM  2 mg Oral BID  . clopidogrel  75 mg Oral Daily  . docusate sodium  200 mg Oral BID  . heparin  5,000 Units Subcutaneous Q8H  . lisinopril  10 mg Oral Daily  . methadone  110 mg Oral Daily   Continuous Infusions: . sodium chloride 10 mL/hr at 10/27/18 1259  . dextrose 5 % and 0.9% NaCl 75 mL/hr (10/24/18 1742)  . piperacillin-tazobactam (ZOSYN)  IV 3.375 g (10/27/18 1259)  . vancomycin Stopped (10/27/18 1251)   PRN Meds:.sodium chloride, acetaminophen **OR** acetaminophen, HYDROcodone-acetaminophen, magnesium hydroxide, meloxicam, ondansetron **OR** ondansetron (ZOFRAN) IV, polyethylene glycol, sodium phosphate, sorbitol  No Known Allergies  Physical Exam  Vitals  Blood pressure (!) 104/51,  pulse 61, temperature 98.4 F (36.9 C), temperature source Axillary, resp. rate 18, height 5\' 5"  (1.651 m), weight 68.7 kg, SpO2 90 %.  Lower Extremity exam: Examination of the foot today shows that she appears to be more stable with it.  She states she not really having much pain and she feels better overall.  Foot has less cellulitis around the region.  The incision appears to be stable.  Wound packing was removed from both incision margin site as well as the abscess opened on the plantar aspect of her foot.  Wound packing shows serous and bloody drainage but no overt pus is noted.  CBC Recent Labs  Lab 10/24/18 1606 10/25/18 0523  WBC 9.9 6.8  HGB 10.5* 10.7*  HCT 33.4* 34.5*  PLT 330 312  MCV 89.8 90.8  MCH 28.2 28.2  MCHC 31.4 31.0  RDW 12.7 13.0   ------------------------------------------------------------------------------------------------------------------ Assessment & Plan: I added vancomycin to her mix yesterday and she is showing some possible gram-positive cocci growing.  This is in addition to the Zosyn.  Wound packings were changed today and redressed.  I saw the patient today with Dr. Tama High and we hope to be able to send her home on oral antibiotics tomorrow.  Ordered an OrthoWedge shoe for states she would put weight on that aspect of the foot and she can have bathroom privileges with assistance utilizing a walker once physical therapy has a chance to look at her.  I will need to follow her up next week in the office.  She will need home health care to change the packing in the dressings daily.  Packing should be iodoform gauze to the lateral wound on the plantar wound.  Dressing should be heavy gauze padding multiple layers of 4 x 4's along with an ABD pad and a Kerlix wrap.  Needs to utilize the OrthoWedge shoe at all times when any weight is on the foot but should only keep 50% of the weight on her foot when she is using the walker at home.  Recommend bed rest as much  as possible at home but she can transfer to a bedside chair the toilet in the bathroom or go to the couch and or lounge chair and sit and elevate.  Active Problems:   Atherosclerosis of artery of extremity with ulceration [facility])   Family Communication: Plan discussed with patient   Albertine Patricia M.D on 10/27/2018 at 1:17 PM  Thank you for the consult, we will follow the patient with you in the Hospital.

## 2018-10-27 NOTE — TOC Initial Note (Signed)
Transition of Care [facility]) - Initial/Assessment Note    Patient Details  Name: Yesenia Shepard MRN: [id number] Date of Birth: 08-May-1951  [facility] CM/SW Contact:    Su Hilt, RN Phone Number: 10/27/2018, 2:39 PM  Clinical Narrative:                  Met with the patient to discuss DC plan and needs She lives with her SO She stated that she has a walker at home and does not see that she needs a BSC She is open with [facility] agency for RN, PT and OT She states that her Boyfriend provides transportation  She sees Dr. Aundra Dubin as PCP Goes to [place] [facility]   She can afford her medications without a problem, CM to continue to monitor for needs      Expected Discharge Plan: [place] Barriers to Discharge: Continued Medical Work up   Patient Goals and CMS Choice        Expected Discharge Plan and Services Expected Discharge Plan: [place]   Discharge Planning Services: CM Consult   Living arrangements for the past 2 months: Single Family Home                          Prior Living Arrangements/Services Living arrangements for the past 2 months: Single Family Home Lives with:: Significant Other Patient language and need for interpreter reviewed:: No Do you feel safe going back to the place where you live?: Yes      Need for Family Participation in Patient Care: No (Comment) Care giver support system in place?: Yes (comment) Current home services: DME, Home OT, Home PT, Home RN(walker, open with [facility]) Criminal Activity/Legal Involvement Pertinent to Current Situation/Hospitalization: No - Comment as needed  Activities of Daily Living Home Assistive Devices/Equipment: Walker (specify type) ADL Screening (condition at time of admission) Patient's cognitive ability adequate to safely complete daily activities?: Yes Is the patient deaf or have difficulty hearing?:  No Does the patient have difficulty seeing, even when wearing glasses/contacts?: No Does the patient have difficulty concentrating, remembering, or making decisions?: Yes Patient able to express need for assistance with ADLs?: Yes Does the patient have difficulty dressing or bathing?: Yes(due to chronic pain) Independently performs ADLs?: No Does the patient have difficulty walking or climbing stairs?: Yes Weakness of Legs: Both Weakness of Arms/Hands: Both  Permission Sought/Granted                  Emotional Assessment Appearance:: Appears stated age Attitude/Demeanor/Rapport: Engaged Affect (typically observed): Accepting Orientation: : Oriented to Self, Oriented to Place, Oriented to  Time, Oriented to Situation Alcohol / Substance Use: Tobacco Use, Alcohol Use Psych Involvement: No (comment)  Admission diagnosis:  Atherosclerosis of artery of extremity with ulceration ([place]) [I70.299, L97.909] Patient Active Problem List   Diagnosis Date Noted  . Atherosclerotic peripheral vascular disease with ulceration ([place]) 10/24/2018  . Atherosclerosis of artery of extremity with ulceration ([place]) 10/24/2018  . Atherosclerosis of native arteries of extremity with rest pain ([place]) 10/01/2018  . Chronic midline low back pain with bilateral sciatica 04/23/2018  . Prediabetes 04/10/2018  . Vitamin D deficiency 04/10/2018  . Swelling of limb 03/01/2018  . Atherosclerosis of native arteries of extremity with intermittent claudication ([place]) 01/03/2018  . Leg pain 12/12/2017  . S/P right rotator cuff repair 01/16/2017  . Preoperative  examination 01/14/2017  . Right shoulder pain 09/06/2016  . Hepatic cirrhosis ([place]) 02/09/2016  . Chronic hepatitis C without hepatic coma ([place]) 11/09/2015  . HLD (hyperlipidemia) 08/20/2015  . Peripheral vascular disease ([place]) 08/12/2015  . Essential hypertension 08/12/2015  . DM type 2 (diabetes mellitus, type 2) ([place]) 04/19/2015  . Current smoker  03/15/2015  . History of recreational drug use 03/15/2015  . Osteoarthritis 03/15/2015  . Preventative health care 03/15/2015  . Ulcer of left lower leg, limited to breakdown of skin ([place]) 03/15/2015  . Anxiety and depression 08/25/2012   PCP:  [name], [name], MD Pharmacy:   [facility] [address] [place] [address] [place] [address] Phone: [phone] Fax: [place], [place] [address] [facility] [address] [place] [address] Phone: [phone] Fax: [phone]     Social Determinants of Health (SDOH) Interventions    Readmission Risk Interventions No flowsheet data found.

## 2018-10-28 LAB — SURGICAL PATHOLOGY

## 2018-10-28 LAB — VANCOMYCIN, RANDOM: Vancomycin Rm: 39

## 2018-10-28 MED ORDER — VANCOMYCIN HCL 10 G IV SOLR
1250.0000 mg | INTRAVENOUS | Status: DC
  Filled 2018-10-28: qty 1250

## 2018-10-28 NOTE — Progress Notes (Signed)
[facility] Podiatry                                                      Patient Demographics  Yesenia Shepard, is a 68 y.o. female   MRN: [id number]   DOB - [birth year]  Admit Date - 10/24/2018    Outpatient Primary MD for the patient is [name], [name], MD  Consult requested in the Hospital by Algernon Huxley, MD, On 10/28/2018    With History of -  Past Medical History:  Diagnosis Date  . Anxiety   . Arthritis   . Chronic pain   . Depression   . Drug abuse ([place])    History of polysubstance abuse; currently on methadone.  . Hepatitis    History of Hep "C". treated and cured with Harvoni  . History of chicken pox   . Hypertension   . Panic attacks   . Peripheral vascular disease ([place])    stent in place.   . Pre-diabetes   . UTI (urinary tract infection)    History of      Past Surgical History:  Procedure Laterality Date  . ABDOMINAL HYSTERECTOMY  1989  . AMPUTATION TOE Left 10/26/2018   Procedure: 5TH RAY RESCTION;  Surgeon: Albertine Patricia, DPM;  Location: [facility] ORS;  Service: Podiatry;  Laterality: Left;  . CHOLECYSTECTOMY  1987  . INTRACAPSULAR CATARACT EXTRACTION Left   . LOWER EXTREMITY ANGIOGRAPHY Left 12/17/2017   Procedure: LOWER EXTREMITY ANGIOGRAPHY;  Surgeon: Katha Cabal, MD;  Location: [place] [facility];  Service: Cardiovascular;  Laterality: Left;  . LOWER EXTREMITY ANGIOGRAPHY Left 10/24/2018   Procedure: LOWER EXTREMITY ANGIOGRAPHY;  Surgeon: Algernon Huxley, MD;  Location: [place] [facility];  Service: Cardiovascular;  Laterality: Left;  . PERIPHERAL VASCULAR CATHETERIZATION Left 05/04/2015   Procedure: Lower Extremity Angiography;  Surgeon: Katha Cabal, MD;  Location: [place] [facility];  Service: Cardiovascular;  Laterality: Left;  . PERIPHERAL VASCULAR CATHETERIZATION Left 05/04/2015   Procedure: Lower  Extremity Intervention;  Surgeon: Katha Cabal, MD;  Location: [place] [facility];  Service: Cardiovascular;  Laterality: Left;  . SHOULDER ARTHROSCOPY WITH OPEN ROTATOR CUFF REPAIR Right 01/16/2017   Procedure: SHOULDER ARTHROSCOPY WITH OPEN ROTATOR CUFF REPAIR;  Surgeon: Thornton Park, MD;  Location: [facility] ORS;  Service: Orthopedics;  Laterality: Right;  . TONSILLECTOMY AND ADENOIDECTOMY  1971    in for   No chief complaint on file.    HPI  Yesenia Shepard  is a 68 y.o. female, 2 days status post fifth ray amputation and I&D of abscess left foot.  They states she is doing well at this point    Review of Systems:  In addition to the HPI above,  No Fever-chills, No Headache, No changes with Vision or hearing, No problems swallowing food or Liquids, No Chest pain, Cough or Shortness of Breath, No Abdominal pain, No Nausea or Vommitting, Bowel movements are regular, No Blood in stool or Urine,  dysuria, apparently patient likely needs a Foley catheter I recommend nurses call the admitting physician. No new skin rashes or bruises, No new joints pains-aches,  No new weakness, tingling, numbness in any extremity, No recent weight gain or loss, No polyuria, polydypsia or polyphagia, No significant Mental Stressors.  A full 10 point Review of Systems was done,  except as stated above, all other Review of Systems were negative.   Social History Social History   Tobacco Use  . Smoking status: Former Smoker    Packs/day: 0.50    Years: 30.00    Pack years: 15.00    Types: Cigarettes    Last attempt to quit: 07/25/2018    Years since quitting: 0.2  . Smokeless tobacco: Never Used  . Tobacco comment: Currently smoking 1/2 ppd  Substance Use Topics  . Alcohol use: No    Alcohol/week: 0.0 standard drinks    Family History Family History  Problem Relation Age of Onset  . Arthritis Mother   . Mental illness Mother        depression  . Diabetes Mother   . Cancer Mother         pancreatic  . Cancer Father        colon  . Heart disease Father   . Diabetes Father   . Cancer Daughter        lung    Prior to Admission medications   Medication Sig Start Date End Date Taking? Authorizing Provider  aspirin 81 MG tablet Take 81 mg by mouth daily.   Yes [provider]  atorvastatin (LIPITOR) 40 MG tablet Take 1 tablet (40 mg total) by mouth daily. Patient taking differently: Take 40 mg by mouth every other day.  04/10/18  Yes [name], [name], MD  cephALEXin (KEFLEX) 500 MG capsule Take 1 capsule (500 mg total) by mouth 3 (three) times daily. 10/21/18  Yes Kris Hartmann, NP  clonazePAM (KLONOPIN) 2 MG tablet Take 2 mg by mouth 2 (two) times daily.  12/08/16  Yes [provider]  methadone (DOLOPHINE) 10 MG/5ML solution Take 110 mg by mouth daily.    Yes [provider]  clopidogrel (PLAVIX) 75 MG tablet TAKE 1 TABLET BY MOUTH ONCE DAILY Patient not taking: No sig reported 12/23/17   Schnier, Dolores Lory, MD  docusate sodium (COLACE) 100 MG capsule Take 200 mg by mouth 2 (two) times daily.     [provider]  furosemide (LASIX) 20 MG tablet Take 1 tablet (20 mg total) by mouth daily. In am Patient not taking: Reported on 10/03/2018 04/10/18   [name], [name], MD  HYDROcodone-acetaminophen (NORCO) 5-325 MG tablet Take 1-2 tablets by mouth every 6 (six) hours as needed for moderate pain or severe pain. Patient not taking: Reported on 06/02/2018 02/27/18   Schnier, Dolores Lory, MD  lisinopril (PRINIVIL,ZESTRIL) 10 MG tablet Take 1 tablet (10 mg total) by mouth daily. Patient not taking: Reported on 10/24/2018 04/23/18   [name], [name], MD  meloxicam (MOBIC) 15 MG tablet Take 1 tablet (15 mg total) by mouth daily as needed for pain. Patient not taking: Reported on 06/02/2018 04/23/18   [name], [name], MD  polyethylene glycol (MIRALAX / GLYCOLAX) packet Take 17 g by mouth daily as needed for mild  constipation.    [provider]    Anti-infectives (From admission, onward)   Start     Dose/Rate Route Frequency Ordered Stop   10/29/18 1200  vancomycin (VANCOCIN) 1,250 mg in sodium chloride 0.9 % 250 mL IVPB     1,250 mg 166.7 mL/hr over 90 Minutes Intravenous Every 24 hours 10/28/18 1400     10/27/18 0000  vancomycin (VANCOCIN) IVPB 750 mg/150 ml premix  Status:  Discontinued     750 mg 150 mL/hr over 60 Minutes Intravenous Every 12 hours 10/26/18 1400  10/28/18 1400   10/26/18 1030  vancomycin (VANCOCIN) 2,000 mg in sodium chloride 0.9 % 500 mL IVPB     2,000 mg 250 mL/hr over 120 Minutes Intravenous  Once 10/26/18 1017 10/26/18 1402   10/26/18 1015  vancomycin (VANCOCIN) 2,000 mg in sodium chloride 0.9 % 500 mL IVPB  Status:  Discontinued     2,000 mg 250 mL/hr over 120 Minutes Intravenous  Once 10/26/18 1014 10/26/18 1016   10/24/18 1800  piperacillin-tazobactam (ZOSYN) IVPB 3.375 g     3.375 g 12.5 mL/hr over 240 Minutes Intravenous Every 8 hours 10/24/18 1503     10/24/18 1600  cephALEXin (KEFLEX) capsule 500 mg  Status:  Discontinued     500 mg Oral 3 times daily 10/24/18 1503 10/24/18 1618   10/24/18 1300  ceFAZolin (ANCEF) IVPB 2g/100 mL premix  Status:  Discontinued     2 g 200 mL/hr over 30 Minutes Intravenous  Once 10/24/18 1252 10/24/18 1503   10/24/18 1237  ceFAZolin (ANCEF) 2-4 GM/100ML-% IVPB    Note to Pharmacy:  Despina Arias  : cabinet override      10/24/18 1237 10/24/18 1350      Scheduled Meds: . aspirin EC  81 mg Oral Daily  . atorvastatin  40 mg Oral Daily  . clonazePAM  2 mg Oral BID  . clopidogrel  75 mg Oral Daily  . docusate sodium  200 mg Oral BID  . heparin  5,000 Units Subcutaneous Q8H  . lisinopril  10 mg Oral Daily  . methadone  110 mg Oral Daily   Continuous Infusions: . sodium chloride Stopped (10/27/18 1259)  . piperacillin-tazobactam (ZOSYN)  IV 3.375 g (10/28/18 1352)  . [START ON 10/29/2018] vancomycin     PRN  Meds:.sodium chloride, acetaminophen **OR** acetaminophen, HYDROcodone-acetaminophen, magnesium hydroxide, meloxicam, ondansetron **OR** ondansetron (ZOFRAN) IV, polyethylene glycol, sodium phosphate, sorbitol  No Known Allergies  Physical Exam  Vitals  Blood pressure (!) 105/51, pulse (!) 59, temperature 99.1 F (37.3 C), temperature source Oral, resp. rate 16, height 5\' 5"  (1.651 m), weight 64.9 kg, SpO2 97 %.  Lower Extremity exam: Left foot appears much improved swelling and redness are decreased.  Incision margin looks to be stable.  Data Review  CBC Recent Labs  Lab 10/24/18 1606 10/25/18 0523  WBC 9.9 6.8  HGB 10.5* 10.7*  HCT 33.4* 34.5*  PLT 330 312  MCV 89.8 90.8  MCH 28.2 28.2  MCHC 31.4 31.0  RDW 12.7 13.0   ------------------------------------------------------------------------------------------------------------------  Chemistries  Recent Labs  Lab 10/24/18 1302 10/24/18 1606 10/25/18 0523  NA  --   --  140  K  --   --  3.5  CL  --   --  100  CO2  --   --  31  GLUCOSE  --   --  123*  BUN 11  --  8  CREATININE 0.78 0.73 0.86  CALCIUM  --   --  8.4*   ------------------------------------------------------------------------------------------Assessment & Plan: Change the dressing and remove the packing L think needs to be packed any longer.  I will put fresh dressing on and she is to continue to just be very careful with it only really bathroom privileges when she gets home but she can get up and move from the bed to the couch also.  Should use a walker.  I put a flat postop shoe in her room but I did order an OrthoWedge and requested that she get an OrthoWedge shoe instead of  a flat 1 to hopefully take more pressure off the lateral plantar fifth metatarsal area where we amputated the fifth ray.  She will need regular dressing changes least every other day and will need needs to return to my office next week.  Active Problems:   Atherosclerosis of artery of  extremity with ulceration [facility])   Family Communication: Plan discussed with patient   Albertine Patricia M.D on 10/28/2018 at 2:13 PM  Thank you for the consult, we will follow the patient with you in the Hospital.

## 2018-10-28 NOTE — Care Management Important Message (Signed)
Important Message  Patient Details  Name: Yesenia Shepard MRN: [id number] Date of Birth: 07-04-1951   Medicare Important Message Given:  Yes    [name] 10/28/2018, 11:15 AM

## 2018-10-28 NOTE — Progress Notes (Signed)
[facility]  Daily Progress Note   Subjective  - 2 Days Post-Op  No events overnight.  Has not been out of bed or walked.  Physical therapy currently in the room  Objective Vitals:   10/27/18 2044 10/28/18 0348 10/28/18 0500 10/28/18 0804  BP: (!) 97/53 (!) 104/57  (!) 105/51  Pulse: (!) 53 62  (!) 59  Resp: 16 16  16   Temp: 98.8 F (37.1 C) 98.6 F (37 C)  99.1 F (37.3 C)  TempSrc: Oral Oral  Oral  SpO2: 93% 91%  97%  Weight:   64.9 kg   Height:        Intake/Output Summary (Last 24 hours) at 10/28/2018 0945 Last data filed at 10/27/2018 1900 Gross per 24 hour  Intake 618.68 ml  Output 1250 ml  Net -631.32 ml    PULM  CTAB CV  RRR VASC  toes with good capillary refill.  Bulky dressing remains in place.  Laboratory CBC    Component Value Date/Time   WBC 6.8 10/25/2018 0523   HGB 10.7 (L) 10/25/2018 0523   HCT 34.5 (L) 10/25/2018 0523   PLT 312 10/25/2018 0523    BMET    Component Value Date/Time   NA 140 10/25/2018 0523   NA 139 02/04/2015 1305   K 3.5 10/25/2018 0523   CL 100 10/25/2018 0523   CO2 31 10/25/2018 0523   GLUCOSE 123 (H) 10/25/2018 0523   BUN 8 10/25/2018 0523   BUN 10 02/04/2015 1305   CREATININE 0.86 10/25/2018 0523   CREATININE 0.76 01/05/2016 1524   CALCIUM 8.4 (L) 10/25/2018 0523   GFRNONAA >60 10/25/2018 0523   GFRNONAA 83 01/05/2016 1524   GFRAA >60 10/25/2018 0523   GFRAA >89 01/05/2016 1524    Assessment/Planning: POD #4 s/p LLE revascularization, POD 2 s/p left fifth Ray amputation by Podiatry   Desires to go home but has not been out of bed.  Working with physical therapy currently.  Will need to do well with to be felt to go safe at home.  Cultures pending.  Appreciate ID input and will await final cultures before planning outpatient therapy.  Likely discharge home tomorrow with home health versus discharge home tomorrow or Thursday to rehab if physical therapy does not think she would be safe  at home    [facility]  10/28/2018, 9:45 AM

## 2018-10-28 NOTE — Evaluation (Signed)
Physical Therapy Evaluation Patient Details Name: Yesenia Shepard MRN: [id number] DOB: 09/03/1950 Today's Date: 10/28/2018   History of Present Illness  68 y.o.female with infected ulcerations on the left foot; s/p L 5th ray resection 2/2 to athelosclerosis of LE with ulceration. Patient is PNWB (50%) on LLE while wearing orthowedge shoe, and OOB for necessities only. Patient has PMH: HTN, DM, HLD, osteoporosis, Hep C.   Clinical Impression  Patient in bed and talking on phone with bed alarm set on PT arrival. Patient amenable to therapy. Patient oriented x4, but demonstrated a decreased state of arousal requiring frequent redirection to task/conversation and increased processing time. Patient very drowsy and difficult to assess understanding of precautions, DME use, and donning/doffing of OrthoWedge post-op shoe. Patient attempted bed mobility from a flat bed with Min A, but ultimately was able to perform bed mobility with frequent reminders to limit WB through LLE with HOB elevated. Patient demonstrates generalized weakness BLE and required MaxA to perform sit<>stand from elevated surface with RW. Patient unable to maintain position and observe precautions without MaxA. Patient unable to repeat precautions back and demonstrated some confusion with donning/doffing OrthoWedge shoe. Patient will benefit from continued skilled therapeutic intervention to improve understanding of precautions address deficits in strength, balance, and mobility, and decrease risk of falls.  Based on patient's presentation at time of evaluation, difficulty with memory per her chart, and the availability of help at home, PT recommends SNF for discharge to ensure safety and observation of precautions.   Patient left in bed with bed alarm set, L foot elevated, and talking on the phone with family/friends. All needs met.     Follow Up Recommendations SNF    Equipment Recommendations  Other (comment)(Patient has all DME needs  met previously.)    Recommendations for Other Services       Precautions / Restrictions Precautions Precautions: Fall Required Braces or Orthoses: Other Brace Other Brace: Orthowedge shoe Restrictions Weight Bearing Restrictions: Yes LLE Weight Bearing: Partial weight bearing LLE Partial Weight Bearing Percentage or Pounds: 50% Other Position/Activity Restrictions: OOB for toileting or chair transfer      Mobility  Bed Mobility Overal bed mobility: Needs Assistance Bed Mobility: Rolling;Sidelying to Sit Rolling: Min guard Sidelying to sit: Modified independent (Device/Increase time);HOB elevated       General bed mobility comments: Patient utilized bed rails for all bed mobility and was unable to transfer SL<>sit from a flat bed.   Transfers Overall transfer level: Needs assistance Equipment used: Rolling walker (2 wheeled) Transfers: Sit to/from Stand Sit to Stand: Max assist;From elevated surface         General transfer comment: Patient unable to negotiate standing from elevated bed with BUE use and maintain precautions. Unable to determine cause as weakness or state of arousal/cognition.  Ambulation/Gait             General Gait Details: Unable to safely assess at this time.   Stairs            Wheelchair Mobility    Modified Rankin (Stroke Patients Only)       Balance Overall balance assessment: Needs assistance Sitting-balance support: Feet supported;Bilateral upper extremity supported(RLE supported, LLE partial support) Sitting balance-Leahy Scale: Good Sitting balance - Comments: Patient able to tolerate A/P challenge and weight shifting/scooting to EOB without LOB. Patient's level of arousal required increased time for task completion.     Standing balance-Leahy Scale: Zero Standing balance comment: Patient required Max A to transfer from sit<>stand with  RW, and was unable to maintain position.                              Pertinent Vitals/Pain Pain Assessment: 0-10 Pain Score: 5  Pain Location: L foot Pain Intervention(s): Limited activity within patient's tolerance;Repositioned;Monitored during session    [place] expects to be discharged to:: Private residence Living Arrangements: Spouse/significant other Available Help at Discharge: Family;Available PRN/intermittently Type of Home: House Home Access: Ramped entrance     Home Layout: Two level;Able to live on main level with bedroom/bathroom Home Equipment: Gilford Rile - 2 wheels;Shower seat Additional Comments: Patient used 2WW prior to admission.     Prior Function Level of Independence: Needs assistance         Comments: Patient reported needing assistance a majority of the time with ADLs, but reports she is able to shower independently on occasion; she has fallen multiple (unable to quanitfy) times in the past 12 months. Her husband is available intermittently throughout the day.     Hand Dominance   Dominant Hand: Right    Extremity/Trunk Assessment   Upper Extremity Assessment Upper Extremity Assessment: Overall WFL for tasks assessed;Generalized weakness    Lower Extremity Assessment Lower Extremity Assessment: Generalized weakness       Communication   Communication: No difficulties  Cognition Arousal/Alertness: Suspect due to medications Behavior During Therapy: WFL for tasks assessed/performed Overall Cognitive Status: Difficult to assess                                 General Comments: Per chart patient has difficulties with memory at baseline; she was A&O x4, but demonstrated limited attenion span and difficult to assess understanding of precautions. Patient required increased processing time throughout the session and appear drowsy.      General Comments      Exercises Other Exercises Other Exercises: EOB sitting balance with reaches and A/P challenges, x5. No LOB. Increased  processing time required.  Other Exercises: Patient education (demonstration) on donning/doffing orthowedge shoe. Patient education on PNWB and DME use; demonstration only. Patient not safe to get up 2/2 to arousal level. Other Exercises: Functional transfers: bed mobility flat surface, ModA and use of railings, HOB elevated CGA. Sit <>stand Max A, RW, unable to maintain position and observe precautions.   Assessment/Plan    PT Assessment Patient needs continued PT services  PT Problem List Decreased strength;Decreased mobility;Decreased safety awareness;Decreased knowledge of precautions;Decreased activity tolerance;Decreased balance;Decreased knowledge of use of DME;Decreased skin integrity       PT Treatment Interventions DME instruction;Functional mobility training;Balance training;Patient/family education;Gait training;Therapeutic activities;Neuromuscular re-education;Therapeutic exercise;Stair training    PT Goals (Current goals can be found in the Care Plan section)  Acute Rehab PT Goals Patient Stated Goal: go home PT Goal Formulation: With patient Time For Goal Achievement: 11/11/18 Potential to Achieve Goals: Good    Frequency 7X/week   Barriers to discharge Decreased caregiver support Patient's level of function and understanding of precautions combined with intermittent availability of caregiver at home are barriers to a safe home discharge.    Co-evaluation               AM-PAC PT "6 Clicks" Mobility  Outcome Measure Help needed turning from your back to your side while in a flat bed without using bedrails?: A Lot Help needed moving from lying on your back to sitting  on the side of a flat bed without using bedrails?: A Lot Help needed moving to and from a bed to a chair (including a wheelchair)?: A Lot Help needed standing up from a chair using your arms (e.g., wheelchair or bedside chair)?: A Lot Help needed to walk in hospital room?: A Lot Help needed climbing  3-5 steps with a railing? : Total 6 Click Score: 11    End of Session Equipment Utilized During Treatment: Gait belt;Other (comment)(Orthowedge shoe) Activity Tolerance: Patient limited by lethargy;Patient limited by pain Patient left: in bed;with call bell/phone within reach;with bed alarm set   PT Visit Diagnosis: Muscle weakness (generalized) (M62.81);Difficulty in walking, not elsewhere classified (R26.2);History of falling (Z91.81)    Time: 1638-4536 PT Time Calculation (min) (ACUTE ONLY): 33 min   Charges:   PT Evaluation $PT Eval Moderate Complexity: 1 Mod PT Treatments $Therapeutic Activity: 8-22 mins       Myles Gip PT, DPT [phone] 10/28/2018, 9:57 AM

## 2018-10-28 NOTE — Progress Notes (Signed)
Bladder scan showed 883 ml urine in bladder. MD notified through secure chat and foley catheter ordered. NT inserted foley. Patient has had output.  Harvest Dark, RN

## 2018-10-28 NOTE — Progress Notes (Signed)
Pharmacy Antibiotic Note  Yesenia Shepard is a 68 y.o. female admitted on 10/24/2018 with osteomyelitis.  Pharmacy has been consulted for vancomycin dosing.  Plan: 4/21@1024 : Random level: 39 Patient was previously on Vancomycin 750mg  Q 12 hours. Received 4 doses.  Will hold next dose and adjust to: Vancomycin 1250mg  Q 24 hours Calculated AUC: 522.9 Cssmin: 12.0 T1/2: 13.5  Height: 5\' 5"  (165.1 cm) Weight: 143 lb 1.6 oz (64.9 kg) IBW/kg (Calculated) : 57  Temp (24hrs), Avg:98.6 F (37 C), Min:98 F (36.7 C), Max:99.1 F (37.3 C)  Recent Labs  Lab 10/24/18 1302 10/24/18 1606 10/25/18 0523 10/28/18 1024  WBC  --  9.9 6.8  --   CREATININE 0.78 0.73 0.86  --   VANCORANDOM  --   --   --  39    Estimated Creatinine Clearance: 56.3 mL/min (by C-G formula based on SCr of 0.86 mg/dL).    No Known Allergies  Antimicrobials this admission: 0417 Zosyn >>  0419 vancomycin >>   Microbiology results: 0419 Surgical/deep wound culture: pending 0418 MRSA PCR: negative  Thank you for allowing pharmacy to be a part of this patient's care.  Johnay Mano A Maite Burlison 10/28/2018 1:56 PM

## 2018-10-29 DIAGNOSIS — N179 Acute kidney failure, unspecified: Secondary | ICD-10-CM

## 2018-10-29 DIAGNOSIS — B952 Enterococcus as the cause of diseases classified elsewhere: Secondary | ICD-10-CM

## 2018-10-29 DIAGNOSIS — I70262 Atherosclerosis of native arteries of extremities with gangrene, left leg: Secondary | ICD-10-CM

## 2018-10-29 DIAGNOSIS — L97529 Non-pressure chronic ulcer of other part of left foot with unspecified severity: Secondary | ICD-10-CM

## 2018-10-29 DIAGNOSIS — I739 Peripheral vascular disease, unspecified: Secondary | ICD-10-CM

## 2018-10-29 LAB — BASIC METABOLIC PANEL
Anion gap: 9 (ref 5–15)
BUN: 23 mg/dL (ref 8–23)
CO2: 29 mmol/L (ref 22–32)
Calcium: 8.1 mg/dL — ABNORMAL LOW (ref 8.9–10.3)
Chloride: 103 mmol/L (ref 98–111)
Creatinine, Ser: 3.2 mg/dL — ABNORMAL HIGH (ref 0.44–1.00)
GFR calc Af Amer: 16 mL/min — ABNORMAL LOW (ref >60)
GFR calc non Af Amer: 14 mL/min — ABNORMAL LOW (ref >60)
Glucose, Bld: 129 mg/dL — ABNORMAL HIGH (ref 70–99)
Potassium: 4.1 mmol/L (ref 3.5–5.1)
Sodium: 141 mmol/L (ref 135–145)

## 2018-10-29 LAB — CBC
HCT: 33.7 % — ABNORMAL LOW (ref 36.0–46.0)
Hemoglobin: 10.2 g/dL — ABNORMAL LOW (ref 12.0–15.0)
MCH: 28 pg (ref 26.0–34.0)
MCHC: 30.3 g/dL (ref 30.0–36.0)
MCV: 92.6 fL (ref 80.0–100.0)
Platelets: 303 10*3/uL (ref 150–400)
RBC: 3.64 MIL/uL — ABNORMAL LOW (ref 3.87–5.11)
RDW: 13.1 % (ref 11.5–15.5)
WBC: 8.6 10*3/uL (ref 4.0–10.5)
nRBC: 0 % (ref 0.0–0.2)

## 2018-10-29 LAB — VANCOMYCIN, RANDOM: Vancomycin Rm: 40

## 2018-10-29 MED ORDER — VANCOMYCIN VARIABLE DOSE PER UNSTABLE RENAL FUNCTION (PHARMACIST DOSING): Status: DC

## 2018-10-29 MED ORDER — SODIUM CHLORIDE 0.9 % IV SOLN
3.0000 g | Freq: Two times a day (BID) | INTRAVENOUS | Status: DC
  Administered 2018-10-29 – 2018-10-31 (×4): 3 g via INTRAVENOUS
  Filled 2018-10-29 (×5): qty 3

## 2018-10-29 MED ORDER — TRAMADOL HCL 50 MG PO TABS
50.0000 mg | ORAL_TABLET | Freq: Four times a day (QID) | ORAL | Status: DC | PRN
  Administered 2018-10-29 – 2018-11-02 (×2): 50 mg via ORAL
  Filled 2018-10-29 (×2): qty 1

## 2018-10-29 MED ORDER — TRAMADOL HCL 50 MG PO TABS: 100.0000 mg | ORAL_TABLET | Freq: Four times a day (QID) | ORAL | Status: DC | PRN

## 2018-10-29 MED ORDER — CLONAZEPAM 1 MG PO TABS
1.0000 mg | ORAL_TABLET | Freq: Two times a day (BID) | ORAL | Status: DC
  Administered 2018-10-29 – 2018-11-04 (×9): 1 mg via ORAL
  Filled 2018-10-29 (×9): qty 1

## 2018-10-29 NOTE — Progress Notes (Signed)
[facility]                                                      Patient Demographics  Yesenia Shepard, is a 68 y.o. female   MRN: [id number]   DOB - 25-Dec-1950  Admit Date - 10/24/2018    Outpatient Primary MD for the patient is [name], [name], MD  Consult requested in the Hospital by Algernon Huxley, MD, On 10/29/2018   With History of -  Past Medical History:  Diagnosis Date  . Anxiety   . Arthritis   . Chronic pain   . Depression   . Drug abuse ([place])    History of polysubstance abuse; currently on methadone.  . Hepatitis    History of Hep "C". treated and cured with Harvoni  . History of chicken pox   . Hypertension   . Panic attacks   . Peripheral vascular disease ([place])    stent in place.   . Pre-diabetes   . UTI (urinary tract infection)    History of      Past Surgical History:  Procedure Laterality Date  . ABDOMINAL HYSTERECTOMY  1989  . AMPUTATION TOE Left 10/26/2018   Procedure: 5TH RAY RESCTION;  Surgeon: Albertine Patricia, DPM;  Location: [facility] ORS;  Service: Podiatry;  Laterality: Left;  . CHOLECYSTECTOMY  1987  . INTRACAPSULAR CATARACT EXTRACTION Left   . LOWER EXTREMITY ANGIOGRAPHY Left 12/17/2017   Procedure: LOWER EXTREMITY ANGIOGRAPHY;  Surgeon: Katha Cabal, MD;  Location: [place] [facility];  Service: Cardiovascular;  Laterality: Left;  . LOWER EXTREMITY ANGIOGRAPHY Left 10/24/2018   Procedure: LOWER EXTREMITY ANGIOGRAPHY;  Surgeon: Algernon Huxley, MD;  Location: [place] [facility];  Service: Cardiovascular;  Laterality: Left;  . PERIPHERAL VASCULAR CATHETERIZATION Left 05/04/2015   Procedure: Lower Extremity Angiography;  Surgeon: Katha Cabal, MD;  Location: [place] [facility];  Service: Cardiovascular;  Laterality: Left;  . PERIPHERAL VASCULAR CATHETERIZATION Left 05/04/2015   Procedure: Lower Extremity  Intervention;  Surgeon: Katha Cabal, MD;  Location: [place] [facility];  Service: Cardiovascular;  Laterality: Left;  . SHOULDER ARTHROSCOPY WITH OPEN ROTATOR CUFF REPAIR Right 01/16/2017   Procedure: SHOULDER ARTHROSCOPY WITH OPEN ROTATOR CUFF REPAIR;  Surgeon: Thornton Park, MD;  Location: [facility] ORS;  Service: Orthopedics;  Laterality: Right;  . TONSILLECTOMY AND ADENOIDECTOMY  1971    in for   No chief complaint on file.    HPI  Yesenia Shepard  is a 68 y.o. female, 4 days status post fifth ray resection left foot due to osteomyelitis also I&D of abscess on the plantar aspect of the foot.   Social History Social History   Tobacco Use  . Smoking status: Former Smoker    Packs/day: 0.50    Years: 30.00    Pack years: 15.00    Types: Cigarettes    Last attempt to quit: 07/25/2018    Years since quitting: 0.2  . Smokeless tobacco: Never Used  . Tobacco comment: Currently smoking 1/2 ppd  Substance Use Topics  . Alcohol use: No    Alcohol/week: 0.0 standard drinks    Family History Family History  Problem Relation Age of Onset  . Arthritis Mother   . Mental illness Mother        depression  . Diabetes  Mother   . Cancer Mother        pancreatic  . Cancer Father        colon  . Heart disease Father   . Diabetes Father   . Cancer Daughter        lung    Prior to Admission medications   Medication Sig Start Date End Date Taking? Authorizing Provider  aspirin 81 MG tablet Take 81 mg by mouth daily.   Yes [provider]  atorvastatin (LIPITOR) 40 MG tablet Take 1 tablet (40 mg total) by mouth daily. Patient taking differently: Take 40 mg by mouth every other day.  04/10/18  Yes [name], [name], MD  cephALEXin (KEFLEX) 500 MG capsule Take 1 capsule (500 mg total) by mouth 3 (three) times daily. 10/21/18  Yes Kris Hartmann, NP  clonazePAM (KLONOPIN) 2 MG tablet Take 2 mg by mouth 2 (two) times daily.  12/08/16  Yes [provider]  methadone  (DOLOPHINE) 10 MG/5ML solution Take 110 mg by mouth daily.    Yes [provider]  clopidogrel (PLAVIX) 75 MG tablet TAKE 1 TABLET BY MOUTH ONCE DAILY Patient not taking: No sig reported 12/23/17   Schnier, Dolores Lory, MD  docusate sodium (COLACE) 100 MG capsule Take 200 mg by mouth 2 (two) times daily.     [provider]  furosemide (LASIX) 20 MG tablet Take 1 tablet (20 mg total) by mouth daily. In am Patient not taking: Reported on 10/03/2018 04/10/18   [name], [name], MD  HYDROcodone-acetaminophen (NORCO) 5-325 MG tablet Take 1-2 tablets by mouth every 6 (six) hours as needed for moderate pain or severe pain. Patient not taking: Reported on 06/02/2018 02/27/18   Schnier, Dolores Lory, MD  lisinopril (PRINIVIL,ZESTRIL) 10 MG tablet Take 1 tablet (10 mg total) by mouth daily. Patient not taking: Reported on 10/24/2018 04/23/18   [name], [name], MD  meloxicam (MOBIC) 15 MG tablet Take 1 tablet (15 mg total) by mouth daily as needed for pain. Patient not taking: Reported on 06/02/2018 04/23/18   [name], [name], MD  polyethylene glycol (MIRALAX / GLYCOLAX) packet Take 17 g by mouth daily as needed for mild constipation.    [provider]    Anti-infectives (From admission, onward)   Start     Dose/Rate Route Frequency Ordered Stop   10/29/18 2000  Ampicillin-Sulbactam (UNASYN) 3 g in sodium chloride 0.9 % 100 mL IVPB     3 g 200 mL/hr over 30 Minutes Intravenous Every 12 hours 10/29/18 1216     10/29/18 1200  vancomycin (VANCOCIN) 1,250 mg in sodium chloride 0.9 % 250 mL IVPB  Status:  Discontinued     1,250 mg 166.7 mL/hr over 90 Minutes Intravenous Every 24 hours 10/28/18 1400 10/29/18 1035   10/29/18 1033  vancomycin variable dose per unstable renal function (pharmacist dosing)  Status:  Discontinued      Does not apply See admin instructions 10/29/18 1035 10/29/18 1148   10/27/18 0000  vancomycin (VANCOCIN) IVPB 750 mg/150 ml premix   Status:  Discontinued     750 mg 150 mL/hr over 60 Minutes Intravenous Every 12 hours 10/26/18 1400 10/28/18 1400   10/26/18 1030  vancomycin (VANCOCIN) 2,000 mg in sodium chloride 0.9 % 500 mL IVPB     2,000 mg 250 mL/hr over 120 Minutes Intravenous  Once 10/26/18 1017 10/26/18 1402   10/26/18 1015  vancomycin (VANCOCIN) 2,000 mg in sodium chloride 0.9 % 500 mL IVPB  Status:  Discontinued     2,000 mg 250 mL/hr over 120 Minutes Intravenous  Once 10/26/18 1014 10/26/18 1016   10/24/18 1800  piperacillin-tazobactam (ZOSYN) IVPB 3.375 g  Status:  Discontinued     3.375 g 12.5 mL/hr over 240 Minutes Intravenous Every 8 hours 10/24/18 1503 10/29/18 1148   10/24/18 1600  cephALEXin (KEFLEX) capsule 500 mg  Status:  Discontinued     500 mg Oral 3 times daily 10/24/18 1503 10/24/18 1618   10/24/18 1300  ceFAZolin (ANCEF) IVPB 2g/100 mL premix  Status:  Discontinued     2 g 200 mL/hr over 30 Minutes Intravenous  Once 10/24/18 1252 10/24/18 1503   10/24/18 1237  ceFAZolin (ANCEF) 2-4 GM/100ML-% IVPB    Note to Pharmacy:  Despina Arias  : cabinet override      10/24/18 1237 10/24/18 1350      Scheduled Meds: . aspirin EC  81 mg Oral Daily  . atorvastatin  40 mg Oral Daily  . clonazePAM  1 mg Oral BID  . clopidogrel  75 mg Oral Daily  . docusate sodium  200 mg Oral BID  . heparin  5,000 Units Subcutaneous Q8H  . methadone  110 mg Oral Daily   Continuous Infusions: . sodium chloride Stopped (10/27/18 1259)  . ampicillin-sulbactam (UNASYN) IV     PRN Meds:.sodium chloride, acetaminophen **OR** acetaminophen, ondansetron **OR** ondansetron (ZOFRAN) IV, polyethylene glycol, sorbitol, traMADol  No Known Allergies  Physical Exam  Vitals  Blood pressure (!) 117/56, pulse (!) 58, temperature 98.4 F (36.9 C), temperature source Oral, resp. rate 16, height 5\' 5"  (1.651 m), weight 67.4 kg, SpO2 95 %.  Lower Extremity exam: Dressing change today and foot is vastly improved.  Cellulitis  on the plantar aspect of the foot is cleared nicely.  Still a small draining wound from the region but is serous type drainage.  Laterally incision is intact and appears to be healing.  No erythema redness or drainage from this region. Data Review  CBC Recent Labs  Lab 10/24/18 1606 10/25/18 0523 10/29/18 0947  WBC 9.9 6.8 8.6  HGB 10.5* 10.7* 10.2*  HCT 33.4* 34.5* 33.7*  PLT 330 312 303  MCV 89.8 90.8 92.6  MCH 28.2 28.2 28.0  MCHC 31.4 31.0 30.3  RDW 12.7 13.0 13.1   ------------------------------------------------------------------------------------------------------------------  Chemistries  Recent Labs  Lab 10/24/18 1302 10/24/18 1606 10/25/18 0523 10/29/18 0947  NA  --   --  140 141  K  --   --  3.5 4.1  CL  --   --  100 103  CO2  --   --  31 29  GLUCOSE  --   --  123* 129*  BUN 11  --  8 23  CREATININE 0.78 0.73 0.86 3.20*  CALCIUM  --   --  8.4* 8.1*   ------------------------------------------------------------------------------------------------------------------ estimated creatinine clearance is 15.1 mL/min (A) (by C-G formula based on SCr of 3.2 mg/dL (H)). ---------------------------------------------------------------------------------- Assessment & Plan: Think her foot is much better.  Also felt like based on the resection of the proximal metatarsal and the solidity of the bone that we likely remove the infected portions of the metatarsal and she should be able to start oral antibiotics.  Pathology reports even though the proximal margin was marked with blue ink still not comment on the clear margin proximally.  I do feel however she is significantly better and a clear margin was achieved.  Cellulitis is vastly improved.  I do have  some concerns about her creatinine elevating as well as her commode her filtration rate reducing.  She is currently on vancomycin and Zosyn we might be able to move her to an oral antibiotic series based on Dr. Hayden Rasmussen  evaluation.  From my perspective she can go home anytime she is stable from an overall medical standpoint.  Cultures are showing likely Enterococcus faecalis with some possible gram-positive rods. She needs a heavy padded dressing with 4 x 4's abdominal pad and a Kerlix wrap and needs to use the wedge shoe when she starts to get up and walk soon as she is capable of doing that. Active Problems:   Atherosclerosis of artery of extremity with ulceration [facility])   Family Communication: Plan discussed with patient an  Albertine Patricia M.D on 10/29/2018 at 3:28 PM  Thank you for the consult, we will follow the patient with you in the Hospital.

## 2018-10-29 NOTE — Progress Notes (Addendum)
[facility] Daily Progress Note  Subjective: 5 Days Post-Op:  1. Ultrasound guidance for vascular access right femoral artery 2. Catheter placement into left common femoral artery from right femoral approach 3. Aortogram and selective left lower extremity angiogram 4. Percutaneous transluminal angioplasty of left tibioperoneal trunk and proximal posterior tibial artery with 4 mm diameter by 6 cm length Lutonix drug-coated angioplasty balloon 5.  Percutaneous transluminal angioplasty of the left SFA and most proximal popliteal artery with 5 mm diameter by 22 cm length Lutonix drug-coated angioplasty balloon             6.  Stent placement to the left SFA with a 6 mm diameter by 20 cm length life stent for greater than 50% residual stenosis after angioplasty 7. StarClose closure device right femoral artery  Patient without complaint today. Slightly narcotized this AM.   Objective: Vitals:   10/28/18 2111 10/29/18 0358 10/29/18 0500 10/29/18 0925  BP: (!) 114/56 (!) 114/50  (!) 117/56  Pulse: (!) 57 (!) 57  (!) 58  Resp: 16 16  16   Temp:  98.5 F (36.9 C)  98.4 F (36.9 C)  TempSrc:  Oral  Oral  SpO2: 99% 100%  95%  Weight:   67.4 kg   Height:        Intake/Output Summary (Last 24 hours) at 10/29/2018 0948 Last data filed at 10/29/2018 0900 Gross per 24 hour  Intake 1269.22 ml  Output 1325 ml  Net -55.78 ml   Physical Exam: A&Ox3, NAD CV: RRR Pulmonary: CTA Bilaterally Abdomen: Soft, Non-tender, Non-distended, (+) Bowel Sounds Vascular:  Left Lower Extremity: Podiatry dressing intact, clean and dry. Toes warm. Calf soft, thigh soft. Extremity warm down to toes.    Laboratory: CBC    Component Value Date/Time   WBC 6.8 10/25/2018 0523   HGB 10.7 (L) 10/25/2018 0523   HCT 34.5 (L) 10/25/2018 0523   PLT 312 10/25/2018 0523   BMET    Component Value Date/Time   NA 140  10/25/2018 0523   NA 139 02/04/2015 1305   K 3.5 10/25/2018 0523   CL 100 10/25/2018 0523   CO2 31 10/25/2018 0523   GLUCOSE 123 (H) 10/25/2018 0523   BUN 8 10/25/2018 0523   BUN 10 02/04/2015 1305   CREATININE 0.86 10/25/2018 0523   CREATININE 0.76 01/05/2016 1524   CALCIUM 8.4 (L) 10/25/2018 0523   GFRNONAA >60 10/25/2018 0523   GFRNONAA 83 01/05/2016 1524   GFRAA >60 10/25/2018 0523   GFRAA >89 01/05/2016 1524   Assessment/Planning: The patient is a 68 year old female with multiple medical issues including peripheral artery disease status post left lower extremity endovascular intervention, status post left foot fifth ray amputation / I&D abscess plantar deep space by podiatry - Stable 1) CBC - Stable 2) Patient overly narcotized in my opinion.  Lethargic during our conversation this a.m.  Patient needs to be out of bed at least sitting in chair and working with PT and OT.  I had a long conversation with the patient in regard to how important slowly starting to increase her activity level following podiatry's instructions is to avoid postoperative complications.  I will discontinue the patient's Vicodin and change it to tramadol.  Patient on methadone.   3) Physical therapy recommends dispo to SNF.  Agree with this.  I had a long conversation with the patient in regard to what SNF is and why it is important to continue her post operative care at a  rehab facility before going home.  The patient's husband is disabled and will not be able to provide the appropriate care if discharged home right from the hospital.  I have consulted social work for SNF placement.  At this time the patient is agreeable to this plan. 4) Will leave foley in setting of increasing creatinine and implement strict I&O's 5) Creatinine now 3.20 from 0.86 four days ago. Will consult nephrology. Will also adjust medications as per pharmacy recommendations.   Discussed with Dr. Ellis Parents Nyelah Emmerich  PA-C 10/29/2018 9:48 AM

## 2018-10-29 NOTE — Progress Notes (Signed)
Pharmacy Antibiotic Note  Yesenia Shepard is a 68 y.o. female admitted on 10/24/2018 with osteomyelitis.  Pharmacy has been consulted for vancomycin dosing. Patient is also on pip/tazo  Today, 10/29/2018 Day #4 vanco and Day #5 pip/tazo  Scr returned this am at 3.2 (up from 0.86)  risk factors for AKI - angiogram 4/17, vanco, pip/tazo, ACE-I (last dose 4/19), and hypotension.  NSAIDS - none given (meloxicam was PRN - no doses given).    Random vancomycin level = 39 4/21 at 10:24 - dose given by time lab resulted and pharmacy called RN.  Her last vanco dose was 4/21 at 11:44am on a dose of 750mg  IV q12h. Dose was changed - no doses given since 4/21 am  Bone cx = E. Faecalis with susc pending  Discussed rec for medication changes with vascular surgery (recommendations accepted)  Discussed AKI and cx results with Dr Delaine Lame and will change to amp/sulbactam to avoid nephrotoxicity of current regimen  Plan:  Amp/sulbactam 3gm IV q12h  Continue to follow renal function closely and adjust accordingly  Random vancomycin level is ordered for today to evaluate current serum concentration (but no further vancomycin to be dosed at this time)  Height: 5\' 5"  (165.1 cm) Weight: 148 lb 11.2 oz (67.4 kg) IBW/kg (Calculated) : 57  Temp (24hrs), Avg:98.4 F (36.9 C), Min:98.3 F (36.8 C), Max:98.5 F (36.9 C)  Recent Labs  Lab 10/24/18 1302 10/24/18 1606 10/25/18 0523 10/28/18 1024 10/29/18 0947  WBC  --  9.9 6.8  --  8.6  CREATININE 0.78 0.73 0.86  --  3.20*  VANCORANDOM  --   --   --  39  --     Estimated Creatinine Clearance: 15.1 mL/min (A) (by C-G formula based on SCr of 3.2 mg/dL (H)).    No Known Allergies  Antimicrobials this admission: 0417 Zosyn >>  0419 vancomycin >>   Microbiology results: 0419 Surgical/deep wound culture: E. Faecalis (susc pending) [phone] MRSA PCR: negative  Thank you for allowing pharmacy to be a part of this patient's care.  Doreene Eland,  PharmD, BCPS.   Work Cell: [phone] 10/29/2018 12:14 PM

## 2018-10-29 NOTE — Progress Notes (Signed)
VAST RN consulted to assess PIV. Unit RN, Janett Billow reported IV pump alarming and IV site looking slightly puffy. VAST RN assessed current PIV; no redness or puffiness noted. Pt reported no burning or discomfort. IV flushing well at this time.  Notified Janett Billow; instructed to place IV team consult at later time if things change and pt needs another IV.

## 2018-10-29 NOTE — Progress Notes (Signed)
Physical Therapy Treatment Patient Details Name: Yesenia Shepard MRN: [id number] DOB: 06/19/1951 Today's Date: 10/29/2018    History of Present Illness 68 y.o.female with infected ulcerations on the left foot; s/p L 5th ray resection 2/2 to athelosclerosis of LE with ulceration. Patient is PNWB (50%) on LLE while wearing orthowedge shoe, and OOB for necessities only. Patient has PMH: HTN, DM, HLD, osteoporosis, Hep C.     PT Comments    Patient awake in bed and amenable to therapy on PT arrival. Patient much more alert today and able to participate more actively. Patient performed all bed mobility with SBA, HOB elevated and continued use of bed rails. Patient performed seated EOB balance and seated EOB BLE strengthening exercises with good form and effort. Patient's B ankle DF is significantly limited 2/2 to "an accident" per patient report and patient is unable to achieve complete heel contact/foot flat on RLE sitting EOB. Additionally, on LLE while wearing the OrthoWedge post-op shoe, patient continues to have toe touch down in the presence of verbal and tactile cues as well as Mod A. Limitations in ankle mobility present significant challenges to patient's ability observe precautions while performing sit<>stand transfers and mobility. Patient educated on precautions, DME use, and OrthoWedge shoe. Patient continues to need reinforcement. Patient will continue to benefit from skilled therapeutic services to address deficits in mobility, strength, safety, DME use, and balance in order to return to PLOF and decrease risk of falls.  Patient left in bed with bed alarm set and call bell/remote in reach. All needs met.      Follow Up Recommendations  SNF     Equipment Recommendations  Other (comment)(Patient has 2WW, but may benefit from [facility] if she remains unable to observe precautions in standing.)    Recommendations for Other Services       Precautions / Restrictions Precautions Precautions:  Fall Required Braces or Orthoses: Other Brace Other Brace: Orthowedge shoe Restrictions Weight Bearing Restrictions: Yes LLE Weight Bearing: Partial weight bearing LLE Partial Weight Bearing Percentage or Pounds: 50% Other Position/Activity Restrictions: OOB for toileting or chair transfer    Mobility  Bed Mobility Overal bed mobility: Needs Assistance Bed Mobility: Rolling;Sidelying to Sit Rolling: Supervision Sidelying to sit: Modified independent (Device/Increase time);HOB elevated       General bed mobility comments: Patient utilized bed rails for all bed mobility and was unable to transfer SL<>sit from a flat bed.   Transfers Overall transfer level: Needs assistance Equipment used: Rolling walker (2 wheeled) Transfers: Sit to/from Stand Sit to Stand: From elevated surface;Max assist         General transfer comment: Patient continues to be unable to negotiate standing from elevated bed with BUE use and maintain precautions. Patient's limitations in B ankle DF present a significant challenge to standing safely with RW and maintaining precautions.   Ambulation/Gait             General Gait Details: Unable to safely assess at this time.    Stairs             Wheelchair Mobility    Modified Rankin (Stroke Patients Only)       Balance Overall balance assessment: Needs assistance Sitting-balance support: Feet supported(RLE supported, LLE partial support) Sitting balance-Leahy Scale: Good Sitting balance - Comments: Patient able to tolerate seated EOB BLE exercises and weight shifting with no LOB without UE support.     Standing balance-Leahy Scale: Zero Standing balance comment: Patient required Max A to transfer from  sit<>stand with RW, and was unable to maintain position and precautions likely 2/2 to limitations in ankle DF.                             Cognition                                               Exercises General Exercises - Lower Extremity Ankle Circles/Pumps: Seated;15 reps Long Arc Quad: Seated;10 reps Heel Slides: Seated;10 reps Hip ABduction/ADduction: Seated;10 reps Hip Flexion/Marching: Seated;20 reps Other Exercises Other Exercises: EOB sitting balance with reaches and head turns (vertical and lateral), x5. No LOB. Increased processing time required.  Other Exercises: Patient education (demonstration and practice) on donning/doffing orthowedge shoe. Patient education on PNWB and DME use. Patient remains unable to articulate precuations when asked. Other Exercises: Functional transfers: bed mobility flat surface HOB elevated SBA. Sit <>stand Max A, RW, unable to maintain position and observe precautions.    General Comments        Pertinent Vitals/Pain Pain Assessment: Faces Faces Pain Scale: Hurts a little bit Pain Location: L foot Pain Intervention(s): Limited activity within patient's tolerance;Monitored during session;Repositioned;RN gave pain meds during session    Home Living                      Prior Function            PT Goals (current goals can now be found in the care plan section) Acute Rehab PT Goals Patient Stated Goal: go home PT Goal Formulation: With patient Time For Goal Achievement: 11/11/18 Potential to Achieve Goals: Good Progress towards PT goals: Progressing toward goals    Frequency    7X/week      PT Plan      Co-evaluation              AM-PAC PT "6 Clicks" Mobility   Outcome Measure  Help needed turning from your back to your side while in a flat bed without using bedrails?: A Lot Help needed moving from lying on your back to sitting on the side of a flat bed without using bedrails?: A Lot Help needed moving to and from a bed to a chair (including a wheelchair)?: A Lot Help needed standing up from a chair using your arms (e.g., wheelchair or bedside chair)?: A Lot Help needed to walk in hospital room?: A  Lot Help needed climbing 3-5 steps with a railing? : Total 6 Click Score: 11    End of Session Equipment Utilized During Treatment: Gait belt;Other (comment)(Orthowedge shoe) Activity Tolerance: Patient tolerated treatment well;Patient limited by fatigue Patient left: in bed;with call bell/phone within reach;with bed alarm set   PT Visit Diagnosis: Muscle weakness (generalized) (M62.81);Difficulty in walking, not elsewhere classified (R26.2);History of falling (Z91.81)     Time: 1010-1038 PT Time Calculation (min) (ACUTE ONLY): 28 min  Charges:  $Therapeutic Exercise: 8-22 mins $Therapeutic Activity: 8-22 mins                    Myles Gip PT, DPT [phone] 10/29/2018, 11:10 AM

## 2018-10-29 NOTE — Progress Notes (Signed)
   Date of Admission:  10/24/2018   *   ID: Yesenia Shepard is a 68 y.o. female Active Problems:   Atherosclerosis of artery of extremity with ulceration ([place])  Urinary retention has foley since yesterday Medications:  . aspirin EC  81 mg Oral Daily  . atorvastatin  40 mg Oral Daily  . clonazePAM  1 mg Oral BID  . clopidogrel  75 mg Oral Daily  . docusate sodium  200 mg Oral BID  . heparin  5,000 Units Subcutaneous Q8H  . methadone  110 mg Oral Daily    Objective: Vital signs in last 24 hours: Temp:  [97.8 F (36.6 C)-98.5 F (36.9 C)] 97.9 F (36.6 C) (04/22 1937) Pulse Rate:  [56-66] 56 (04/22 1937) Resp:  [16-23] 19 (04/22 1937) BP: (104-144)/(45-77) 104/45 (04/22 1937) SpO2:  [95 %-100 %] 100 % (04/22 1937) Weight:  [67.4 kg] 67.4 kg (04/22 0500)  PHYSICAL EXAM:  General: Alert, cooperative, no distress, appears stated age.  Lungs: b/l air entry Heart: s1s2 Abdomen: Soft, non-tender,not distended. Bowel sounds normal. No masses-foley Extremities: left foot - 5th ray amputation surgical site clean Skin: No rashes or lesions. Or bruising Lymph: Cervical, supraclavicular normal. Neurologic: Grossly non-focal  Lab Results Recent Labs    10/29/18 0947  WBC 8.6  HGB 10.2*  HCT 33.7*  NA 141  K 4.1  CL 103  CO2 29  BUN 23  CREATININE 3.20*   Liver Panel No results for input(s): PROT, ALBUMIN, AST, ALT, ALKPHOS, BILITOT, BILIDIR, IBILI in the last 72 hours. Sedimentation Rate No results for input(s): ESRSEDRATE in the last 72 hours. C-Reactive Protein No results for input(s): CRP in the last 72 hours.  Microbiology:  Studies/Results: No results found.   Assessment/Plan: 68 y.o. female with a history of PVD, HTN, Chronic left foot wound was admitted on 10/24/18 for worsening ulceration and infection of the left foot ? Peripheral vascular disease with severe ulceration and necrosis of the left foot 5th toe area-with osteomyelitis- s/p angio, had stent in  left SFA and balloon angioplasty of TPA, post tibial and SFA   s/p 5th ray amputation and I/D of the foot abscess. Bone culture has enterococcus No mRSA- will DC vanco and zosyn and start unasyn  AKI- multifactorial -Vanco, contrast, borderline hypotension  urinary retention   DM- not on any meds-last Hba1c from 04/10/18 was 6.3  Treated hepatitis C infection  Discussed the management with patient, pharmacist and podiatrist

## 2018-10-29 NOTE — Progress Notes (Signed)
PT Cancellation Note  Patient Details Name: Yesenia Shepard MRN: [id number] DOB: [birth year]   Cancelled Treatment:    Reason Eval/Treat Not Completed: Other (comment) Patient with nursing upon PT arrival and having bandages changed. PT and patient discussed concerns regarding ability to achieve standing ambulation with RW and precautions with patient's limited ankle mobility. PT and patient agree that temporary W/C mobility will ensure appropriate healing and maintenance of precautions. PT to follow up at a later time/date.  Myles Gip PT, DPT [phone] 10/29/2018, 3:58 PM

## 2018-10-29 NOTE — NC FL2 (Signed)
[place] LEVEL OF CARE SCREENING TOOL     IDENTIFICATION  Patient Name: Yesenia Shepard Birthdate: [birth year] Sex: female Admission Date (Current Location): 10/24/2018  [place] and [place] Number:  Engineering geologist and Address:  [facility], [address], [place], [place] [address]      Provider Number: [id number]  Attending Physician Name and Address:  Algernon Huxley, MD  Relative Name and Phone Number:  Aleen Campi [phone]    Current Level of Care: Hospital Recommended Level of Care: [place] Prior Approval Number:    Date Approved/Denied: 10/29/18 PASRR Number: [id number]  Discharge Plan: SNF    Current Diagnoses: Patient Active Problem List   Diagnosis Date Noted  . Atherosclerotic peripheral vascular disease with ulceration ([place]) 10/24/2018  . Atherosclerosis of artery of extremity with ulceration ([place]) 10/24/2018  . Atherosclerosis of native arteries of extremity with rest pain ([place]) 10/01/2018  . Chronic midline low back pain with bilateral sciatica 04/23/2018  . Prediabetes 04/10/2018  . Vitamin D deficiency 04/10/2018  . Swelling of limb 03/01/2018  . Atherosclerosis of native arteries of extremity with intermittent claudication ([place]) 01/03/2018  . Leg pain 12/12/2017  . S/P right rotator cuff repair 01/16/2017  . Preoperative examination 01/14/2017  . Right shoulder pain 09/06/2016  . Hepatic cirrhosis ([place]) 02/09/2016  . Chronic hepatitis C without hepatic coma ([place]) 11/09/2015  . HLD (hyperlipidemia) 08/20/2015  . Peripheral vascular disease ([place]) 08/12/2015  . Essential hypertension 08/12/2015  . DM type 2 (diabetes mellitus, type 2) ([place]) 04/19/2015  . Current smoker 03/15/2015  . History of recreational drug use 03/15/2015  . Osteoarthritis 03/15/2015  . Preventative health care 03/15/2015  . Ulcer of left lower leg, limited to breakdown of skin ([place]) 03/15/2015  . Anxiety and  depression 08/25/2012    Orientation RESPIRATION BLADDER Height & Weight     Self, Time, Situation, Place  Normal Continent Weight: 67.4 kg Height:  5\' 5"  (165.1 cm)  BEHAVIORAL SYMPTOMS/MOOD NEUROLOGICAL BOWEL NUTRITION STATUS      Continent Diet(carb modified)  AMBULATORY STATUS COMMUNICATION OF NEEDS Skin   Extensive Assist Verbally Normal, Surgical wounds                       Personal Care Assistance Level of Assistance  Dressing, Bathing Bathing Assistance: Limited assistance   Dressing Assistance: Limited assistance     Functional Limitations Info  Sight, Hearing, Speech Sight Info: Adequate Hearing Info: Adequate Speech Info: Adequate    SPECIAL CARE FACTORS FREQUENCY  PT (By licensed PT)     PT Frequency: 5 times per week              Contractures Contractures Info: Not present    Additional Factors Info  Code Status Code Status Info: full             Current Medications (10/29/2018):  This is the current hospital active medication list Current Facility-Administered Medications  Medication Dose Route Frequency Provider Last Rate Last Dose  . 0.9 %  sodium chloride infusion   Intravenous PRN Algernon Huxley, MD   Stopped at 10/27/18 1259  . acetaminophen (TYLENOL) tablet 650 mg  650 mg Oral Q6H PRN Algernon Huxley, MD       Or  . acetaminophen (TYLENOL) suppository 650 mg  650 mg Rectal Q6H PRN Algernon Huxley, MD      . Ampicillin-Sulbactam (UNASYN) 3 g in sodium chloride  0.9 % 100 mL IVPB  3 g Intravenous Q12H Berton Mount, RPH      . aspirin EC tablet 81 mg  81 mg Oral Daily Algernon Huxley, MD   81 mg at 10/29/18 4709  . atorvastatin (LIPITOR) tablet 40 mg  40 mg Oral Daily Algernon Huxley, MD   40 mg at 10/29/18 0926  . clonazePAM (KLONOPIN) tablet 1 mg  1 mg Oral BID Stegmayer, Kimberly A, PA-C      . clopidogrel (PLAVIX) tablet 75 mg  75 mg Oral Daily Algernon Huxley, MD   75 mg at 10/29/18 0926  . docusate sodium (COLACE) capsule 200 mg  200 mg Oral  BID Algernon Huxley, MD   200 mg at 10/29/18 0926  . heparin injection 5,000 Units  5,000 Units Subcutaneous Q8H Algernon Huxley, MD   5,000 Units at 10/28/18 2113  . methadone (DOLOPHINE) 10 MG/ML solution 110 mg  110 mg Oral Daily Algernon Huxley, MD   110 mg at 10/29/18 1030  . ondansetron (ZOFRAN) tablet 4 mg  4 mg Oral Q6H PRN Algernon Huxley, MD       Or  . ondansetron (ZOFRAN) injection 4 mg  4 mg Intravenous Q6H PRN Algernon Huxley, MD      . polyethylene glycol (MIRALAX / GLYCOLAX) packet 17 g  17 g Oral Daily PRN Algernon Huxley, MD      . sorbitol 70 % solution 30 mL  30 mL Oral Daily PRN Algernon Huxley, MD      . traMADol Veatrice Bourbon) tablet 50 mg  50 mg Oral Q6H PRN Stegmayer, Janalyn Harder, PA-C       Facility-Administered Medications Ordered in Other Encounters  Medication Dose Route Frequency Provider Last Rate Last Dose  . ceFAZolin (ANCEF) IVPB 2g/100 mL premix  2 g Intravenous Once Kris Hartmann, NP         Discharge Medications: Please see discharge summary for a list of discharge medications.  Relevant Imaging Results:  Relevant Lab Results:   Additional Information [id number]  Su Hilt, RN

## 2018-10-29 NOTE — TOC Progression Note (Signed)
Transition of Care [facility]) - Progression Note    Patient Details  Name: Yesenia Shepard MRN: [id number] Date of Birth: 02-Apr-1951  [facility]) CM/SW Contact  Su Hilt, RN Phone Number: 10/29/2018, 2:12 PM  Clinical Narrative:    Met with the patient to discuss DC plan and need to go to Rehab She agrees to start the bed search process    Expected Discharge Plan: [place] Barriers to Discharge: Continued Medical Work up  Expected Discharge Plan and Services Expected Discharge Plan: [place]   Discharge Planning Services: CM Consult   Living arrangements for the past 2 months: Single Family Home                           Social Determinants of Health (SDOH) Interventions    Readmission Risk Interventions No flowsheet data found.

## 2018-10-30 DIAGNOSIS — Z960 Presence of urogenital implants: Secondary | ICD-10-CM

## 2018-10-30 DIAGNOSIS — R339 Retention of urine, unspecified: Secondary | ICD-10-CM

## 2018-10-30 LAB — BASIC METABOLIC PANEL
Anion gap: 10 (ref 5–15)
BUN: 25 mg/dL — ABNORMAL HIGH (ref 8–23)
CO2: 29 mmol/L (ref 22–32)
Calcium: 8.1 mg/dL — ABNORMAL LOW (ref 8.9–10.3)
Chloride: 101 mmol/L (ref 98–111)
Creatinine, Ser: 3.24 mg/dL — ABNORMAL HIGH (ref 0.44–1.00)
GFR calc Af Amer: 16 mL/min — ABNORMAL LOW (ref >60)
GFR calc non Af Amer: 14 mL/min — ABNORMAL LOW (ref >60)
Glucose, Bld: 85 mg/dL (ref 70–99)
Potassium: 3.4 mmol/L — ABNORMAL LOW (ref 3.5–5.1)
Sodium: 140 mmol/L (ref 135–145)

## 2018-10-30 LAB — CBC
HCT: 32.2 % — ABNORMAL LOW (ref 36.0–46.0)
Hemoglobin: 9.8 g/dL — ABNORMAL LOW (ref 12.0–15.0)
MCH: 28.2 pg (ref 26.0–34.0)
MCHC: 30.4 g/dL (ref 30.0–36.0)
MCV: 92.5 fL (ref 80.0–100.0)
Platelets: 295 10*3/uL (ref 150–400)
RBC: 3.48 MIL/uL — ABNORMAL LOW (ref 3.87–5.11)
RDW: 13.2 % (ref 11.5–15.5)
WBC: 9.6 10*3/uL (ref 4.0–10.5)
nRBC: 0 % (ref 0.0–0.2)

## 2018-10-30 LAB — MAGNESIUM: Magnesium: 2.5 mg/dL — ABNORMAL HIGH (ref 1.7–2.4)

## 2018-10-30 MED ORDER — POTASSIUM CHLORIDE CRYS ER 20 MEQ PO TBCR
20.0000 meq | EXTENDED_RELEASE_TABLET | Freq: Once | ORAL | Status: AC
  Administered 2018-10-30: 10:00:00 20 meq via ORAL
  Filled 2018-10-30: qty 1

## 2018-10-30 MED ORDER — METHADONE HCL 10 MG/ML PO CONC
80.0000 mg | Freq: Every day | ORAL | Status: DC
  Administered 2018-10-31 – 2018-11-04 (×5): 80 mg via ORAL
  Filled 2018-10-30 (×10): qty 8

## 2018-10-30 MED ORDER — ORAL CARE MOUTH RINSE
15.0000 mL | Freq: Two times a day (BID) | OROMUCOSAL | Status: DC
  Administered 2018-10-30 – 2018-11-03 (×4): 15 mL via OROMUCOSAL

## 2018-10-30 NOTE — Progress Notes (Signed)
   Date of Admission:  10/24/2018      ID: Yesenia Shepard is a 68 y.o. female   Subjective: Feeling better No fever Still has foley Pain leg betetr  Medications:  . aspirin EC  81 mg Oral Daily  . atorvastatin  40 mg Oral Daily  . clonazePAM  1 mg Oral BID  . clopidogrel  75 mg Oral Daily  . docusate sodium  200 mg Oral BID  . heparin  5,000 Units Subcutaneous Q8H  . mouth rinse  15 mL Mouth Rinse BID  . [START ON 10/31/2018] methadone  80 mg Oral Daily    Objective: Vital signs in last 24 hours: Temp:  [97.9 F (36.6 C)-99.1 F (37.3 C)] 99.1 F (37.3 C) (04/23 1535) Pulse Rate:  [56-61] 61 (04/23 1535) Resp:  [18-19] 19 (04/23 1535) BP: (100-120)/(45-52) 114/51 (04/23 1535) SpO2:  [97 %-100 %] 99 % (04/23 1535) Weight:  [66.1 kg] 66.1 kg (04/23 0458)  PHYSICAL EXAM:  General: Alert, cooperative, no distress, appears stated age.  Lungs: Clear to auscultation bilaterally. No Wheezing or Rhonchi. No rales. Heart: Regular rate and rhythm, no murmur, rub or gallop. Abdomen: Soft, non-tender,not distended. Bowel sounds normal. No masses Extremities: left foot  5th ray amputation- site very clean , no swelling, erythema     Skin: No rashes or lesions. Or bruising Lymph: Cervical, supraclavicular normal. Neurologic: Grossly non-focal  Lab Results Recent Labs    10/29/18 0947 10/30/18 0305  WBC 8.6 9.6  HGB 10.2* 9.8*  HCT 33.7* 32.2*  NA 141 140  K 4.1 3.4*  CL 103 101  CO2 29 29  BUN 23 25*  CREATININE 3.20* 3.24*     Assessment/Plan: 68 y.o.femalewith a history ofPVD, HTN, Chronic left foot wound was admitted on 10/24/18 for worsening ulceration and infection of the left foot ? Peripheral vascular disease with severe ulceration and necrosis of the left foot 5th toearea-with osteomyelitis- s/p angio, had stent in left SFA and balloon angioplasty of TPA, post tibial and SFA  s/p 5th ray amputation and I/D of the foot abscess. Bone culture has  enterococcus This is a therapeutic amputation as discussed with Dr.Troxler  Currently on unasyn- will not need IV on discharge- may be able to give augmentin adjusted for crcl as OP for 10 daysdays  AKI- multifactorial -Vanco, contrast, borderline hypotension  urinary retention   DM- not on any meds-last Hba1c from 04/10/18 was 6.3  Treated hepatitis C infection  Discussed the management with patient and podiatrist

## 2018-10-30 NOTE — Progress Notes (Addendum)
Physical Therapy Treatment Patient Details Name: Yesenia Shepard MRN: [id number] DOB: December 28, 1950 Today's Date: 10/30/2018    History of Present Illness 68 y.o.female with infected ulcerations on the left foot; s/p L 5th ray resection 2/2 to athelosclerosis of LE with ulceration. Patient is PNWB (50%) on LLE while wearing orthowedge shoe, and OOB for necessities only. Patient has PMH: HTN, DM, HLD, osteoporosis, Hep C.     PT Comments    Patient in bed watching television on PT arrival and expressed hesitancy to participate in therapy. Patient expressed feeling very depressed and tired with limited ability to focus. Patient denied desire for chaplain/psychological services. Patient willing to participate in BLE strengthening and bed mobility with PT. Functional level remains consistent with previous sessions, but patient better able to articulate precautions and demonstrated increased effort with strengthening exercises with max encouragement from PT. Patient became drowsy during activity and did not feel safe to attempt sit<>stand with OrthoWedge shoe. Patient was educated on safe DME use with RW and observing precautions while wearing OrthoWedge shoe. Patient agreed to attempt mobility/limited ambulation in concordance with podiatry's orders this afternoon. Based on patient's limitations in ankle DF and decreased strength, patient will benefit from interim use of a wheelchair to maintain WB precautions and allow for proper wound healing.  Patient suffers from Atherosclerosis of artery of extremity with ulceration and is s/p 5th ray excision which impairs his/her ability to perform daily activities involving standing like toileting, dressing, bathing, and ambulation in the home. A RW will not resolve the patient's issue with performing activities of daily living due to patient's limitations in ankle mobility and partial weight bearing (50%) status on LLE. A wheelchair is required/recommended and will allow  patient to safely perform daily activities.  Patient can safely propel the wheelchair in the home or has a caregiver who can provide assistance.   Patient in bed with phone and call bell in reach. All needs met. Nursing and MD notified of patient's expressed depression and inability to maintain focus.   Follow Up Recommendations  SNF     Equipment Recommendations  Other (comment);Wheelchair (measurements PT)(Patient has 2WW, but may benefit from [facility] if she remains unable to observe precautions in standing.)    Recommendations for Other Services       Precautions / Restrictions Precautions Precautions: Fall Required Braces or Orthoses: Other Brace Other Brace: Orthowedge shoe Restrictions Weight Bearing Restrictions: Yes LLE Weight Bearing: Partial weight bearing LLE Partial Weight Bearing Percentage or Pounds: 50% Other Position/Activity Restrictions: OOB for toileting or chair transfer    Mobility  Bed Mobility Overal bed mobility: Needs Assistance Bed Mobility: Rolling;Sidelying to Sit Rolling: Supervision Sidelying to sit: Modified independent (Device/Increase time);HOB elevated       General bed mobility comments: Patient utilized bed rails for all bed mobility.  Transfers Overall transfer level: Needs assistance Equipment used: Rolling walker (2 wheeled) Transfers: Sit to/from Stand Sit to Stand: From elevated surface;Max assist         General transfer comment: Patient continues to be unable to negotiate standing from elevated bed with BUE use and maintain precautions. Patient's limitations in B ankle DF present a significant challenge to standing safely with RW and maintaining precautions. Patient will likely need a W/C for a interim period while recovering to maintain precautions.  Ambulation/Gait             General Gait Details: Unable to safely assess at this time.    Stairs  Wheelchair Mobility    Modified Rankin (Stroke  Patients Only)       Balance                                            Cognition Arousal/Alertness: Awake/alert Behavior During Therapy: Flat affect Overall Cognitive Status: Within Functional Limits for tasks assessed                                 General Comments: Patient expressed feeling depressed and having difficulty focusing. She continues to require increased processing time.      Exercises General Exercises - Lower Extremity Ankle Circles/Pumps: 15 reps Quad Sets: 15 reps Gluteal Sets: 15 reps Heel Slides: 15 reps Hip ABduction/ADduction: 15 reps Other Exercises Other Exercises: Patient education (demonstration and practice) on donning/doffing orthowedge shoe. Patient education on PNWB and DME use. Patient better able to articulate precuations when asked. Other Exercises: Patient education (demonstration) on safe DME use with RW and LLE PWB (LLE extended in front to maintain precautions). Patient too fatigued to attempt at this time.     General Comments        Pertinent Vitals/Pain Pain Assessment: No/denies pain    Home Living                      Prior Function            PT Goals (current goals can now be found in the care plan section) Acute Rehab PT Goals Patient Stated Goal: go home PT Goal Formulation: With patient Time For Goal Achievement: 11/11/18 Potential to Achieve Goals: Good Progress towards PT goals: PT to reassess next treatment    Frequency    7X/week      PT Plan Equipment recommendations need to be updated    Co-evaluation              AM-PAC PT "6 Clicks" Mobility   Outcome Measure  Help needed turning from your back to your side while in a flat bed without using bedrails?: A Lot Help needed moving from lying on your back to sitting on the side of a flat bed without using bedrails?: A Lot Help needed moving to and from a bed to a chair (including a wheelchair)?: A  Lot Help needed standing up from a chair using your arms (e.g., wheelchair or bedside chair)?: A Lot Help needed to walk in hospital room?: A Lot Help needed climbing 3-5 steps with a railing? : Total 6 Click Score: 11    End of Session Equipment Utilized During Treatment: Gait belt;Other (comment)(Orthowedge shoe) Activity Tolerance: Patient limited by fatigue;Patient tolerated treatment well Patient left: in bed;with call bell/phone within reach Nurse Communication: Other (comment) PT Visit Diagnosis: Muscle weakness (generalized) (M62.81);Difficulty in walking, not elsewhere classified (R26.2);History of falling (Z91.81)     Time: 9163-8466 PT Time Calculation (min) (ACUTE ONLY): 29 min  Charges:  $Therapeutic Exercise: 23-37 mins                     Myles Gip PT, [place] #[id number] 10/30/2018, 11:31 AM

## 2018-10-30 NOTE — Consult Note (Signed)
Date: 10/30/2018                  Patient Name:  Yesenia Shepard  MRN: [id number]  DOB: January 14, 1951  Age / Sex: 68 y.o., female         PCP: [name], [name], MD                 Service Requesting Consult: Vascular surgery/ Dew, Erskine Squibb, MD                 Reason for Consult: ARF            History of Present Illness: Patient is a 68 y.o. female with medical problems of peripheral vascular disease, hypertension, chronic left foot ulcer, depression, polysubstance abuse, history of hep C treated,, who was admitted to [facility] on 10/24/2018 for evaluation of left foot wound.  Patient came in with infected ulceration of the left foot and underwent aortogram and angioplasty of left lower extremity on April 17 by Dr. dew.  100 cc of IV contrast was used.  Patient then underwent amputation of fifth ray on the left foot and I&D of the plantar abscess of left foot.  She was started on vancomycin for osteomyelitis on April 19. Patient was recovering, however when serum creatinine was checked on April 22, creatinine has increased to 3.20.  Repeat assessment today shows creatinine is still elevated at 3.24.  Nephrology consult has now been requested for further evaluation.  Vancomycin level 14 on April 22. Wound culture is showing Enterococcus faecalis  Medications: Outpatient medications: Medications Prior to Admission  Medication Sig Dispense Refill Last Dose  . aspirin 81 MG tablet Take 81 mg by mouth daily.   10/24/2018 at Unknown time  . atorvastatin (LIPITOR) 40 MG tablet Take 1 tablet (40 mg total) by mouth daily. (Patient taking differently: Take 40 mg by mouth every other day. ) 90 tablet 3 10/24/2018 at Unknown time  . cephALEXin (KEFLEX) 500 MG capsule Take 1 capsule (500 mg total) by mouth 3 (three) times daily. 21 capsule 0 10/24/2018 at Unknown time  . clonazePAM (KLONOPIN) 2 MG tablet Take 2 mg by mouth 2 (two) times daily.    10/23/2018 at Unknown time  . methadone (DOLOPHINE) 10 MG/5ML  solution Take 110 mg by mouth daily.    10/23/2018 at Unknown time  . clopidogrel (PLAVIX) 75 MG tablet TAKE 1 TABLET BY MOUTH ONCE DAILY (Patient not taking: No sig reported) 30 tablet 11 Not Taking at Unknown time  . docusate sodium (COLACE) 100 MG capsule Take 200 mg by mouth 2 (two) times daily.    prn at prn  . furosemide (LASIX) 20 MG tablet Take 1 tablet (20 mg total) by mouth daily. In am (Patient not taking: Reported on 10/03/2018) 30 tablet 2 Not Taking at Unknown time  . HYDROcodone-acetaminophen (NORCO) 5-325 MG tablet Take 1-2 tablets by mouth every 6 (six) hours as needed for moderate pain or severe pain. (Patient not taking: Reported on 06/02/2018) 40 tablet 0 Not Taking  . lisinopril (PRINIVIL,ZESTRIL) 10 MG tablet Take 1 tablet (10 mg total) by mouth daily. (Patient not taking: Reported on 10/24/2018) 90 tablet 3 Not Taking at Unknown time  . meloxicam (MOBIC) 15 MG tablet Take 1 tablet (15 mg total) by mouth daily as needed for pain. (Patient not taking: Reported on 06/02/2018) 30 tablet 2 Not Taking at Unknown time  . polyethylene glycol (MIRALAX / GLYCOLAX) packet Take 17 g by  mouth daily as needed for mild constipation.   prn at prn    Current medications: Current Facility-Administered Medications  Medication Dose Route Frequency Provider Last Rate Last Dose  . 0.9 %  sodium chloride infusion   Intravenous PRN Algernon Huxley, MD   Stopped at 10/27/18 1259  . acetaminophen (TYLENOL) tablet 650 mg  650 mg Oral Q6H PRN Algernon Huxley, MD       Or  . acetaminophen (TYLENOL) suppository 650 mg  650 mg Rectal Q6H PRN Algernon Huxley, MD      . Ampicillin-Sulbactam (UNASYN) 3 g in sodium chloride 0.9 % 100 mL IVPB  3 g Intravenous Q12H Berton Mount, RPH 200 mL/hr at 10/30/18 1031 3 g at 10/30/18 1031  . aspirin EC tablet 81 mg  81 mg Oral Daily Algernon Huxley, MD   81 mg at 10/30/18 1020  . atorvastatin (LIPITOR) tablet 40 mg  40 mg Oral Daily Algernon Huxley, MD   40 mg at 10/30/18 1020  .  clonazePAM (KLONOPIN) tablet 1 mg  1 mg Oral BID Stegmayer, Kimberly A, PA-C   1 mg at 10/30/18 1020  . clopidogrel (PLAVIX) tablet 75 mg  75 mg Oral Daily Algernon Huxley, MD   75 mg at 10/30/18 1020  . docusate sodium (COLACE) capsule 200 mg  200 mg Oral BID Algernon Huxley, MD   200 mg at 10/30/18 1020  . heparin injection 5,000 Units  5,000 Units Subcutaneous Q8H Algernon Huxley, MD   5,000 Units at 10/30/18 0455  . MEDLINE mouth rinse  15 mL Mouth Rinse BID Mansy, Cashlyn A, MD   15 mL at 10/30/18 1053  . methadone (DOLOPHINE) 10 MG/ML solution 110 mg  110 mg Oral Daily Algernon Huxley, MD   110 mg at 10/30/18 1020  . ondansetron (ZOFRAN) tablet 4 mg  4 mg Oral Q6H PRN Algernon Huxley, MD       Or  . ondansetron (ZOFRAN) injection 4 mg  4 mg Intravenous Q6H PRN Algernon Huxley, MD      . polyethylene glycol (MIRALAX / GLYCOLAX) packet 17 g  17 g Oral Daily PRN Algernon Huxley, MD      . sorbitol 70 % solution 30 mL  30 mL Oral Daily PRN Algernon Huxley, MD      . traMADol Veatrice Bourbon) tablet 50 mg  50 mg Oral Q6H PRN Stegmayer, Kimberly A, PA-C   50 mg at 10/29/18 2034   Facility-Administered Medications Ordered in Other Encounters  Medication Dose Route Frequency Provider Last Rate Last Dose  . ceFAZolin (ANCEF) IVPB 2g/100 mL premix  2 g Intravenous Once Kris Hartmann, NP          Allergies: No Known Allergies    Past Medical History: Past Medical History:  Diagnosis Date  . Anxiety   . Arthritis   . Chronic pain   . Depression   . Drug abuse ([place])    History of polysubstance abuse; currently on methadone.  . Hepatitis    History of Hep "C". treated and cured with Harvoni  . History of chicken pox   . Hypertension   . Panic attacks   . Peripheral vascular disease ([place])    stent in place.   . Pre-diabetes   . UTI (urinary tract infection)    History of     Past Surgical History: Past Surgical History:  Procedure Laterality Date  . ABDOMINAL HYSTERECTOMY  1989  . AMPUTATION TOE Left 10/26/2018    Procedure: 5TH RAY RESCTION;  Surgeon: Albertine Patricia, DPM;  Location: [facility] ORS;  Service: Podiatry;  Laterality: Left;  . CHOLECYSTECTOMY  1987  . INTRACAPSULAR CATARACT EXTRACTION Left   . LOWER EXTREMITY ANGIOGRAPHY Left 12/17/2017   Procedure: LOWER EXTREMITY ANGIOGRAPHY;  Surgeon: Katha Cabal, MD;  Location: [place] [facility];  Service: Cardiovascular;  Laterality: Left;  . LOWER EXTREMITY ANGIOGRAPHY Left 10/24/2018   Procedure: LOWER EXTREMITY ANGIOGRAPHY;  Surgeon: Algernon Huxley, MD;  Location: [place] [facility];  Service: Cardiovascular;  Laterality: Left;  . PERIPHERAL VASCULAR CATHETERIZATION Left 05/04/2015   Procedure: Lower Extremity Angiography;  Surgeon: Katha Cabal, MD;  Location: [place] [facility];  Service: Cardiovascular;  Laterality: Left;  . PERIPHERAL VASCULAR CATHETERIZATION Left 05/04/2015   Procedure: Lower Extremity Intervention;  Surgeon: Katha Cabal, MD;  Location: [place] [facility];  Service: Cardiovascular;  Laterality: Left;  . SHOULDER ARTHROSCOPY WITH OPEN ROTATOR CUFF REPAIR Right 01/16/2017   Procedure: SHOULDER ARTHROSCOPY WITH OPEN ROTATOR CUFF REPAIR;  Surgeon: Thornton Park, MD;  Location: [facility] ORS;  Service: Orthopedics;  Laterality: Right;  . TONSILLECTOMY AND ADENOIDECTOMY  1971     Family History: Family History  Problem Relation Age of Onset  . Arthritis Mother   . Mental illness Mother        depression  . Diabetes Mother   . Cancer Mother        pancreatic  . Cancer Father        colon  . Heart disease Father   . Diabetes Father   . Cancer Daughter        lung     Social History: Social History   Socioeconomic History  . Marital status: Divorced    Spouse name: Not on file  . Number of children: Not on file  . Years of education: Not on file  . Highest education level: Not on file  Occupational History  . Not on file  Social Needs  . Financial resource strain: Not hard at all  . Food  insecurity:    Worry: Never true    Inability: Never true  . Transportation needs:    Medical: No    Non-medical: No  Tobacco Use  . Smoking status: Former Smoker    Packs/day: 0.50    Years: 30.00    Pack years: 15.00    Types: Cigarettes    Last attempt to quit: 07/25/2018    Years since quitting: 0.2  . Smokeless tobacco: Never Used  . Tobacco comment: Currently smoking 1/2 ppd  Substance and Sexual Activity  . Alcohol use: No    Alcohol/week: 0.0 standard drinks  . Drug use: No    Comment: Hx of.  . Sexual activity: Not Currently  Lifestyle  . Physical activity:    Days per week: 0 days    Minutes per session: Not on file  . Stress: Not on file  Relationships  . Social connections:    Talks on phone: Not on file    Gets together: Not on file    Attends religious service: Not on file    Active member of club or organization: Not on file    Attends meetings of clubs or organizations: Not on file    Relationship status: Not on file  . Intimate partner violence:    Fear of current or ex partner: No    Emotionally abused: No  Physically abused: No    Forced sexual activity: No  Other Topics Concern  . Not on file  Social History Narrative   1 daughter died in 10-20-2016    Lives at home not alone      Review of Systems: Gen: Denies any fevers or chills HEENT: Denies vision or hearing problems CV: No chest pain or shortness of breath Resp: No cough or sputum production GI: Denies any blood in the stool, diarrhea, nausea GU : Denies any blood in the urine, history of kidney problems or kidney stones MS: Left foot surgery  Derm:  no complaints Psych:no complaints Heme: no complaints Neuro: no complaints Endocrine  no complaints  Vital Signs: Blood pressure (!) 120/49, pulse 61, temperature 98.9 F (37.2 C), resp. rate 18, height 5\' 5"  (1.651 m), weight 66.1 kg, SpO2 97 %.   Intake/Output Summary (Last 24 hours) at 10/30/2018 1148 Last data filed at 10/30/2018  1010 Gross per 24 hour  Intake 1454.31 ml  Output 700 ml  Net 754.31 ml    Weight trends: Filed Weights   10/28/18 0500 10/29/18 0500 10/30/18 0458  Weight: 64.9 kg 67.4 kg 66.1 kg    Physical Exam: General:  No acute distress, laying in the bed  HEENT  anicteric, moist oral mucous membranes  Neck:  Supple  Lungs:  Decreased breath sounds at bases, normal effort  Heart::  No rub  Abdomen:  Soft, nontender  Extremities:  Left foot bandaged  Neurologic:  Alert, able to answer questions  Skin:  No acute rashes    Lab results: Basic Metabolic Panel: Recent Labs  Lab 10/25/18 0523 10/29/18 0947 10/30/18 0305  NA 140 141 140  K 3.5 4.1 3.4*  CL 100 103 101  CO2 31 29 29   GLUCOSE 123* 129* 85  BUN 8 23 25*  CREATININE 0.86 3.20* 3.24*  CALCIUM 8.4* 8.1* 8.1*  MG  --   --  2.5*    Liver Function Tests: No results for input(s): AST, ALT, ALKPHOS, BILITOT, PROT, ALBUMIN in the last 168 hours. No results for input(s): LIPASE, AMYLASE in the last 168 hours. No results for input(s): AMMONIA in the last 168 hours.  CBC: Recent Labs  Lab 10/29/18 0947 10/30/18 0305  WBC 8.6 9.6  HGB 10.2* 9.8*  HCT 33.7* 32.2*  MCV 92.6 92.5  PLT 303 295    Cardiac Enzymes: No results for input(s): CKTOTAL, TROPONINI in the last 168 hours.  BNP: Invalid input(s): POCBNP  CBG: No results for input(s): GLUCAP in the last 168 hours.  Microbiology: Recent Results (from the past 720 hour(s))  Surgical pcr screen     Status: None   Collection Time: 10/25/18  3:49 AM  Result Value Ref Range Status   MRSA, PCR NEGATIVE NEGATIVE Final   Staphylococcus aureus NEGATIVE NEGATIVE Final    Comment: (NOTE) The Xpert SA Assay (FDA approved for NASAL specimens in patients 21 years of age and older), is one component of a comprehensive surveillance program. It is not intended to diagnose infection nor to guide or monitor treatment. Performed at [facility], [address] [place], [place] [address]   Aerobic/Anaerobic Culture (surgical/deep wound)     Status: None (Preliminary result)   Collection Time: 10/26/18  8:29 AM  Result Value Ref Range Status   Specimen Description   Final    BONE 5TH METATARSAL Performed at [facility], [address], [place], [place] [address]    Special  Requests   Final    NONE Performed at [facility], [place], [place] [address]    Gram Stain   Final    MODERATE WBC PRESENT, PREDOMINANTLY PMN FEW GRAM POSITIVE COCCI Performed at [facility], [place] [address] [place], [place] [address]    Culture   Final    MODERATE GRAM POSITIVE RODS MODERATE ENTEROCOCCUS FAECALIS SUSCEPTIBILITIES PERFORMED ON PREVIOUS CULTURE WITHIN THE LAST 5 DAYS. NO ANAEROBES ISOLATED; CULTURE IN PROGRESS FOR 5 DAYS    Report Status PENDING  Incomplete  Aerobic/Anaerobic Culture (surgical/deep wound)     Status: None (Preliminary result)   Collection Time: 10/26/18  8:33 AM  Result Value Ref Range Status   Specimen Description   Final    BONE 5TH METATARSAL Performed at [facility], [place] [address] [place], [place] [address]    Special Requests   Final    NONE Performed at [facility], [place], [place] [address]    Gram Stain   Final    ABUNDANT WBC PRESENT, PREDOMINANTLY PMN FEW GRAM POSITIVE COCCI Performed at [facility], [place] [address] [place], [place] [address]    Culture   Final    MODERATE GRAM POSITIVE RODS MODERATE ENTEROCOCCUS FAECALIS NO ANAEROBES ISOLATED; CULTURE IN PROGRESS FOR 5 DAYS    Report Status PENDING  Incomplete   Organism ID, Bacteria ENTEROCOCCUS FAECALIS  Final      Susceptibility   Enterococcus faecalis - MIC*    AMPICILLIN <=2 SENSITIVE Sensitive     VANCOMYCIN 1 SENSITIVE Sensitive     GENTAMICIN SYNERGY SENSITIVE Sensitive     * MODERATE ENTEROCOCCUS FAECALIS     Coagulation Studies: No results for input(s): LABPROT,  INR in the last 72 hours.  Urinalysis: No results for input(s): COLORURINE, LABSPEC, PHURINE, GLUCOSEU, HGBUR, BILIRUBINUR, KETONESUR, PROTEINUR, UROBILINOGEN, NITRITE, LEUKOCYTESUR in the last 72 hours.  Invalid input(s): APPERANCEUR      Imaging:  No results found.   Assessment & Plan: Pt is a 68 y.o.   female with medical problems of peripheral vascular disease, hypertension, chronic left foot ulcer, depression, polysubstance abuse, history of hep C treated,, who was admitted to [facility] on 10/24/2018 for evaluation of left foot wound.    1.  Acute kidney injury Likely secondary to ATN secondary to IV contrast exposure as well as vancomycin toxicity Baseline creatinine is 0.73 from October 24, 2018.  Today's creatinine remains elevated at 3.24 although no significant change compared to yesterday.  This may mean that serum creatinine is stabilizing. -Patient is able to take adequate oral intake.  Avoid IV fluids or diuretics unless absolutely necessary Electrolytes and volume status are acceptable.  No acute indication for dialysis at present  2.  Hypokalemia -Agree with potassium replacement as needed  We will continue to follow   LOS: [place] 4/23/202011:48 AM  [place], [place]  Note: This note was prepared with Dragon dictation. Any transcription errors are unintentional

## 2018-10-30 NOTE — Progress Notes (Signed)
 Vein & Vascular Surgery Daily Progress Note   Subjective: 5 Days Post-Op:  1.Ultrasound guidance for vascular accessrightfemoral artery 2.Catheter placement into left common femoral artery from right femoral approach 3.Aortogram and selectiveleftlower extremity angiogram 4.Percutaneous transluminal angioplasty ofleft tibioperoneal trunk and proximal posterior tibial artery with 4 mm diameter by 6 cm length Lutonix drug-coated angioplasty balloon 5.Percutaneous transluminal angioplasty of the left SFA and most proximal popliteal artery with 5 mm diameter by 22 cm length Lutonix drug-coated angioplasty balloon 6.Stent placement to the left SFA with a 6 mm diameter by 20 cm length life stent for greater than 50% residual stenosis after angioplasty 7.StarClose closure device rightfemoral artery  Patient without complaint today. More alert than yesterday AM.   Objective: Vitals:   10/29/18 1937 10/30/18 0458 10/30/18 0507 10/30/18 0801  BP: (!) 104/45  (!) 100/52 (!) 120/49  Pulse: (!) 56  60 61  Resp: 19  18   Temp: 97.9 F (36.6 C)  98.3 F (36.8 C) 98.9 F (37.2 C)  TempSrc:   Oral   SpO2: 100%  97% 97%  Weight:  66.1 kg    Height:        Intake/Output Summary (Last 24 hours) at 10/30/2018 0623 Last data filed at 10/30/2018 0514 Gross per 24 hour  Intake 1214.31 ml  Output 450 ml  Net 764.31 ml   Physical Exam: A&Ox3, NAD CV: RRR Pulmonary: CTA Bilaterally Abdomen: Soft, Nontender, Nondistended, (+) Bowel Sounds Vascular:  Left Lower Extremity: Thigh soft, calf soft. Podiatry dressing intact, clean and dry. Extremity warm distally to toes.    Laboratory: CBC    Component Value Date/Time   WBC 9.6 10/30/2018 0305   HGB 9.8 (L) 10/30/2018 0305   HCT 32.2 (L) 10/30/2018 0305   PLT 295 10/30/2018 0305   BMET    Component Value Date/Time   NA 140  10/30/2018 0305   NA 139 02/04/2015 1305   K 3.4 (L) 10/30/2018 0305   CL 101 10/30/2018 0305   CO2 29 10/30/2018 0305   GLUCOSE 85 10/30/2018 0305   BUN 25 (H) 10/30/2018 0305   BUN 10 02/04/2015 1305   CREATININE 3.24 (H) 10/30/2018 0305   CREATININE 0.76 01/05/2016 1524   CALCIUM 8.1 (L) 10/30/2018 0305   GFRNONAA 14 (L) 10/30/2018 0305   GFRNONAA 83 01/05/2016 1524   GFRAA 16 (L) 10/30/2018 0305   GFRAA >89 01/05/2016 1524   Assessment/Planning: The patient is a 68 year old female with multiple medical issues including peripheral artery disease status post left lower extremity endovascular intervention, status post left foot fifth ray amputation / I&D abscess plantar deep space by podiatry 1) CBC Stable 2) Creatinine still elevated 3.24. I have discontinued nephrotoxic medications with assistance from pharmacy. Awaiting nephrology consult.   3) Podiatry is pleased with wound healing status. Recommends dressing / activity as follows "she needs a heavy padded dressing with 4 x 4's abdominal pad and a Kerlix wrap and needs to use the wedge shoe when she starts to get up and walk soon as she is capable of doing that". Will place daily dressing order. 4) Repleted potassium 5) PT and OT - needs to ambulate 6) Once medically stable will need dispo to SNF. Patient is in agreement  Discussed with Dr. Ellis [facility] PA-C 10/30/2018 9:28 AM

## 2018-10-30 NOTE — Progress Notes (Signed)
PT Cancellation Note  Patient Details Name: Yesenia Shepard MRN: [id number] DOB: [birth year]   Cancelled Treatment:    Reason Eval/Treat Not Completed: Fatigue/lethargy limiting ability to participate PT attempted second treatment today to work on OOB strategies with consideration for patient's limitations in ankle ROM, PWB precuations (50%), and generalized weakness. Patient was on the phone on PT arrival and had just spilled her lunch. PT assisted patient in getting cleaned up and demonstrated RW knee sling as possible DME for ambulation and precaution maintenance. Patient was amenable to try but reported having no energy and needing to eat prior to getting OOB. Chart reviewed. PT will follow up at a later time/date.  Myles Gip PT, DPT [phone] 10/30/2018, 4:40 PM

## 2018-10-30 NOTE — Care Management Important Message (Signed)
Important Message  Patient Details  Name: Yesenia Shepard MRN: [id number] Date of Birth: 02/20/1951   Medicare Important Message Given:  Yes    Juliann Pulse A Teondre Jarosz 10/30/2018, 11:07 AM

## 2018-10-30 NOTE — Progress Notes (Signed)
Pt discussed daughter died four years. Pt presents flat and shows signs of possible depression. Pt able to report when needs to have BM yet does not, pt does not call out following BM. Pt presents with delayed comments and remains non interactive outside of direct questions. RN educated on resources available. Pt declines chaplain resources. Pt declines offer for RN to reach out to MD for psychology/ psychiatry consult

## 2018-10-30 NOTE — Progress Notes (Signed)
[facility]                                                      Patient Demographics  Yesenia Shepard, is a 68 y.o. female   MRN: [id number]   DOB - [birth year]  Admit Date - 10/24/2018    Outpatient Primary MD for the patient is [name], [name], MD  Consult requested in the Hospital by Algernon Huxley, MD, On 10/30/2018   With History of -  Past Medical History:  Diagnosis Date  . Anxiety   . Arthritis   . Chronic pain   . Depression   . Drug abuse ([place])    History of polysubstance abuse; currently on methadone.  . Hepatitis    History of Hep "C". treated and cured with Harvoni  . History of chicken pox   . Hypertension   . Panic attacks   . Peripheral vascular disease ([place])    stent in place.   . Pre-diabetes   . UTI (urinary tract infection)    History of      Past Surgical History:  Procedure Laterality Date  . ABDOMINAL HYSTERECTOMY  1989  . AMPUTATION TOE Left 10/26/2018   Procedure: 5TH RAY RESCTION;  Surgeon: Albertine Patricia, DPM;  Location: [facility];  Service: Podiatry;  Laterality: Left;  . CHOLECYSTECTOMY  1987  . INTRACAPSULAR CATARACT EXTRACTION Left   . LOWER EXTREMITY ANGIOGRAPHY Left 12/17/2017   Procedure: LOWER EXTREMITY ANGIOGRAPHY;  Surgeon: Katha Cabal, MD;  Location: [place] [facility];  Service: Cardiovascular;  Laterality: Left;  . LOWER EXTREMITY ANGIOGRAPHY Left 10/24/2018   Procedure: LOWER EXTREMITY ANGIOGRAPHY;  Surgeon: Algernon Huxley, MD;  Location: [place] [facility];  Service: Cardiovascular;  Laterality: Left;  . PERIPHERAL VASCULAR CATHETERIZATION Left 05/04/2015   Procedure: Lower Extremity Angiography;  Surgeon: Katha Cabal, MD;  Location: [place] [facility];  Service: Cardiovascular;  Laterality: Left;  . PERIPHERAL VASCULAR CATHETERIZATION Left 05/04/2015   Procedure: Lower Extremity  Intervention;  Surgeon: Katha Cabal, MD;  Location: [place] [facility];  Service: Cardiovascular;  Laterality: Left;  . SHOULDER ARTHROSCOPY WITH OPEN ROTATOR CUFF REPAIR Right 01/16/2017   Procedure: SHOULDER ARTHROSCOPY WITH OPEN ROTATOR CUFF REPAIR;  Surgeon: Thornton Park, MD;  Location: [facility];  Service: Orthopedics;  Laterality: Right;  . TONSILLECTOMY AND ADENOIDECTOMY  1971    in for   No chief complaint on file.    HPI  Yesenia Shepard  is a 68 y.o. female, 5 days status post fifth ray amputation and I&D of abscess left foot.  Has been progressing nicely and she is doing well from our perspective she is not really been able to get up and go to the bathroom and she currently has a Foley catheter because of her dysuria.    Review of Systems    In addition to the HPI above,  No Fever-chills, No Headache, No changes with Vision or hearing, No problems swallowing food or Liquids, No Chest pain, Cough or Shortness of Breath, No Abdominal pain, No Nausea or Vommitting, Bowel movements are regular, No Blood in stool or Urine,  No new skin rashes or bruises, No new joints pains-aches,  No new weakness, tingling, numbness in any extremity, No recent weight gain or loss, No polyuria, polydypsia or polyphagia,  No significant Mental Stressors.  A full 10 point Review of Systems was done, except as stated above, all other Review of Systems were negative.   Social History Social History   Tobacco Use  . Smoking status: Former Smoker    Packs/day: 0.50    Years: 30.00    Pack years: 15.00    Types: Cigarettes    Last attempt to quit: 07/25/2018    Years since quitting: 0.2  . Smokeless tobacco: Never Used  . Tobacco comment: Currently smoking 1/2 ppd  Substance Use Topics  . Alcohol use: No    Alcohol/week: 0.0 standard drinks    Family History Family History  Problem Relation Age of Onset  . Arthritis Mother   . Mental illness Mother        depression  .  Diabetes Mother   . Cancer Mother        pancreatic  . Cancer Father        colon  . Heart disease Father   . Diabetes Father   . Cancer Daughter        lung    Prior to Admission medications   Medication Sig Start Date End Date Taking? Authorizing Provider  aspirin 81 MG tablet Take 81 mg by mouth daily.   Yes [provider]  atorvastatin (LIPITOR) 40 MG tablet Take 1 tablet (40 mg total) by mouth daily. Patient taking differently: Take 40 mg by mouth every other day.  04/10/18  Yes [name], [name], MD  cephALEXin (KEFLEX) 500 MG capsule Take 1 capsule (500 mg total) by mouth 3 (three) times daily. 10/21/18  Yes Kris Hartmann, NP  clonazePAM (KLONOPIN) 2 MG tablet Take 2 mg by mouth 2 (two) times daily.  12/08/16  Yes [provider]  methadone (DOLOPHINE) 10 MG/5ML solution Take 110 mg by mouth daily.    Yes [provider]  clopidogrel (PLAVIX) 75 MG tablet TAKE 1 TABLET BY MOUTH ONCE DAILY Patient not taking: No sig reported 12/23/17   Schnier, Dolores Lory, MD  docusate sodium (COLACE) 100 MG capsule Take 200 mg by mouth 2 (two) times daily.     [provider]  furosemide (LASIX) 20 MG tablet Take 1 tablet (20 mg total) by mouth daily. In am Patient not taking: Reported on 10/03/2018 04/10/18   [name], [name], MD  HYDROcodone-acetaminophen (NORCO) 5-325 MG tablet Take 1-2 tablets by mouth every 6 (six) hours as needed for moderate pain or severe pain. Patient not taking: Reported on 06/02/2018 02/27/18   Schnier, Dolores Lory, MD  lisinopril (PRINIVIL,ZESTRIL) 10 MG tablet Take 1 tablet (10 mg total) by mouth daily. Patient not taking: Reported on 10/24/2018 04/23/18   [name], [name], MD  meloxicam (MOBIC) 15 MG tablet Take 1 tablet (15 mg total) by mouth daily as needed for pain. Patient not taking: Reported on 06/02/2018 04/23/18   [name], [name], MD  polyethylene glycol (MIRALAX / GLYCOLAX) packet Take 17 g by mouth  daily as needed for mild constipation.    [provider]    Anti-infectives (From admission, onward)   Start     Dose/Rate Route Frequency Ordered Stop   10/29/18 2000  Ampicillin-Sulbactam (UNASYN) 3 g in sodium chloride 0.9 % 100 mL IVPB     3 g 200 mL/hr over 30 Minutes Intravenous Every 12 hours 10/29/18 1216     10/29/18 1200  vancomycin (VANCOCIN) 1,250 mg in sodium chloride 0.9 % 250 mL IVPB  Status:  Discontinued     1,250 mg 166.7 mL/hr over 90 Minutes Intravenous Every 24 hours 10/28/18 1400 10/29/18 1035   10/29/18 1033  vancomycin variable dose per unstable renal function (pharmacist dosing)  Status:  Discontinued      Does not apply See admin instructions 10/29/18 1035 10/29/18 1148   10/27/18 0000  vancomycin (VANCOCIN) IVPB 750 mg/150 ml premix  Status:  Discontinued     750 mg 150 mL/hr over 60 Minutes Intravenous Every 12 hours 10/26/18 1400 10/28/18 1400   10/26/18 1030  vancomycin (VANCOCIN) 2,000 mg in sodium chloride 0.9 % 500 mL IVPB     2,000 mg 250 mL/hr over 120 Minutes Intravenous  Once 10/26/18 1017 10/26/18 1402   10/26/18 1015  vancomycin (VANCOCIN) 2,000 mg in sodium chloride 0.9 % 500 mL IVPB  Status:  Discontinued     2,000 mg 250 mL/hr over 120 Minutes Intravenous  Once 10/26/18 1014 10/26/18 1016   10/24/18 1800  piperacillin-tazobactam (ZOSYN) IVPB 3.375 g  Status:  Discontinued     3.375 g 12.5 mL/hr over 240 Minutes Intravenous Every 8 hours 10/24/18 1503 10/29/18 1148   10/24/18 1600  cephALEXin (KEFLEX) capsule 500 mg  Status:  Discontinued     500 mg Oral 3 times daily 10/24/18 1503 10/24/18 1618   10/24/18 1300  ceFAZolin (ANCEF) IVPB 2g/100 mL premix  Status:  Discontinued     2 g 200 mL/hr over 30 Minutes Intravenous  Once 10/24/18 1252 10/24/18 1503   10/24/18 1237  ceFAZolin (ANCEF) 2-4 GM/100ML-% IVPB    Note to Pharmacy:  Despina Arias  : cabinet override      10/24/18 1237 10/24/18 1350      Scheduled Meds: . aspirin  EC  81 mg Oral Daily  . atorvastatin  40 mg Oral Daily  . clonazePAM  1 mg Oral BID  . clopidogrel  75 mg Oral Daily  . docusate sodium  200 mg Oral BID  . heparin  5,000 Units Subcutaneous Q8H  . mouth rinse  15 mL Mouth Rinse BID  . methadone  110 mg Oral Daily   Continuous Infusions: . sodium chloride Stopped (10/27/18 1259)  . ampicillin-sulbactam (UNASYN) IV 3 g (10/30/18 1031)   PRN Meds:.sodium chloride, acetaminophen **OR** acetaminophen, ondansetron **OR** ondansetron (ZOFRAN) IV, polyethylene glycol, sorbitol, traMADol  No Known Allergies  Physical Exam he is alert and pleasant and does not complain about any pain at all she just wants to try to get this catheter out.  Vitals  Blood pressure (!) 120/49, pulse 61, temperature 98.9 F (37.2 C), resp. rate 18, height 5\' 5"  (1.651 m), weight 66.1 kg, SpO2 97 %.  Lower Extremity exam: Examination of the foot shows significant improvement reduced redness and cellulitis incision margin appear stable.  Plantar wound is stable as well.  No real drainage from either area at this point.  Data Review  CBC Recent Labs  Lab 10/24/18 1606 10/25/18 0523 10/29/18 0947 10/30/18 0305  WBC 9.9 6.8 8.6 9.6  HGB 10.5* 10.7* 10.2* 9.8*  HCT 33.4* 34.5* 33.7* 32.2*  PLT 330 312 303 295  MCV 89.8 90.8 92.6 92.5  MCH 28.2 28.2 28.0 28.2  MCHC 31.4 31.0 30.3 30.4  RDW 12.7 13.0 13.1 13.2   ------------------------------------------------------------------------------------------------------------------  Chemistries  Recent Labs  Lab 10/24/18 1302 10/24/18 1606 10/25/18 0523 10/29/18 0947 10/30/18 0305  NA  --   --  140 141 140  K  --   --  3.5 4.1  3.4*  CL  --   --  100 103 101  CO2  --   --  31 29 29   GLUCOSE  --   --  123* 129* 85  BUN 11  --  8 23 25*  CREATININE 0.78 0.73 0.86 3.20* 3.24*  CALCIUM  --   --  8.4* 8.1* 8.1*  MG  --   --   --   --  2.5*   ------- Assessment & Plan patient's BUN and creatinine are both  elevated and I am going to write for a nephrology consult today make sure there is nothing else needs to be done regarding that so we can try to get her discharged to a skilled nursing facility.  She is also under the care and follow of Dr. Tama High for infectious disease.  Overall her foot looks very good just dressing changed perhaps every other day with lots of padding and very minimal ambulation since the incision margins on the plantar aspect of the foot.  Would recommend bathroom privileges only and she is to wear the OrthoWedge shoe and use a walker with assistance anytime she gets up.  Active Problems:   Atherosclerosis of artery of extremity with ulceration [facility])   Family Communication: Plan discussed with patient   Albertine Patricia M.D on 10/30/2018 at 10:33 AM  Thank you for the consult, we will follow the patient with you in the Hospital.

## 2018-10-30 NOTE — TOC Progression Note (Signed)
Transition of Care [facility]) - Progression Note    Patient Details  Name: Yesenia Shepard MRN: [id number] Date of Birth: Nov 26, 1950  Transition of Care [facility]) CM/SW [place], [place] Phone Number: 10/30/2018, 2:25 PM  Clinical Narrative: CSW presented bed offers to patient. Patient chose bed at [facility]. CSW notified Claiborne Billings at [facility] of bed acceptance. Claiborne Billings will begin [employer] (traditional) authorization. CSW will continue to follow for discharge planning.     Expected Discharge Plan: [place] Barriers to Discharge: Continued Medical Work up  Expected Discharge Plan and Services Expected Discharge Plan: [place]   Discharge Planning Services: CM Consult   Living arrangements for the past 2 months: Single Family Home                                       Social Determinants of Health (SDOH) Interventions    Readmission Risk Interventions No flowsheet data found.

## 2018-10-31 DIAGNOSIS — F329 Major depressive disorder, single episode, unspecified: Secondary | ICD-10-CM

## 2018-10-31 DIAGNOSIS — F419 Anxiety disorder, unspecified: Secondary | ICD-10-CM

## 2018-10-31 LAB — CBC
HCT: 32.1 % — ABNORMAL LOW (ref 36.0–46.0)
Hemoglobin: 9.8 g/dL — ABNORMAL LOW (ref 12.0–15.0)
MCH: 27.7 pg (ref 26.0–34.0)
MCHC: 30.5 g/dL (ref 30.0–36.0)
MCV: 90.7 fL (ref 80.0–100.0)
Platelets: 313 10*3/uL (ref 150–400)
RBC: 3.54 MIL/uL — ABNORMAL LOW (ref 3.87–5.11)
RDW: 13.2 % (ref 11.5–15.5)
WBC: 11 10*3/uL — ABNORMAL HIGH (ref 4.0–10.5)
nRBC: 0 % (ref 0.0–0.2)

## 2018-10-31 LAB — BASIC METABOLIC PANEL
Anion gap: 9 (ref 5–15)
BUN: 28 mg/dL — ABNORMAL HIGH (ref 8–23)
CO2: 28 mmol/L (ref 22–32)
Calcium: 8.3 mg/dL — ABNORMAL LOW (ref 8.9–10.3)
Chloride: 104 mmol/L (ref 98–111)
Creatinine, Ser: 3.36 mg/dL — ABNORMAL HIGH (ref 0.44–1.00)
GFR calc Af Amer: 15 mL/min — ABNORMAL LOW (ref >60)
GFR calc non Af Amer: 13 mL/min — ABNORMAL LOW (ref >60)
Glucose, Bld: 74 mg/dL (ref 70–99)
Potassium: 3.5 mmol/L (ref 3.5–5.1)
Sodium: 141 mmol/L (ref 135–145)

## 2018-10-31 LAB — MAGNESIUM: Magnesium: 2.6 mg/dL — ABNORMAL HIGH (ref 1.7–2.4)

## 2018-10-31 MED ORDER — TAMSULOSIN HCL 0.4 MG PO CAPS
0.4000 mg | ORAL_CAPSULE | Freq: Every day | ORAL | Status: DC
  Administered 2018-10-31 – 2018-11-03 (×4): 0.4 mg via ORAL
  Filled 2018-10-31 (×4): qty 1

## 2018-10-31 MED ORDER — SODIUM CHLORIDE 0.9 % IV SOLN
3.0000 g | INTRAVENOUS | Status: DC
  Administered 2018-11-01: 3 g via INTRAVENOUS
  Filled 2018-10-31: qty 3

## 2018-10-31 MED ORDER — GLUCERNA SHAKE PO LIQD
237.0000 mL | Freq: Two times a day (BID) | ORAL | Status: DC
  Administered 2018-10-31 – 2018-11-03 (×6): 237 mL via ORAL

## 2018-10-31 NOTE — Progress Notes (Signed)
Physical Therapy Treatment Patient Details Name: Yesenia Shepard MRN: [id number] DOB: [birth year] Today's Date: 10/31/2018    History of Present Illness 68 y.o.female with infected ulcerations on the left foot; s/p L 5th ray resection 2/2 to athelosclerosis of LE with ulceration. Patient is PNWB (50%) on LLE while wearing orthowedge shoe, and OOB for necessities only. Patient has PMH: HTN, DM, HLD, osteoporosis, Hep C.     PT Comments    Patient agreeable to PT. Extended time needed throughout session for thought processing and to redirect patient to task. Overall demonstrated some improvement towards goals. Reported 2/10 pain in L foot, RN in room to administer pain medication at start of session. Pt also on room air throughout mobility, spO2 95% up in chair at end of session. Pt performed sit<> stand several times throughout session. Initially maxAx2, improved to modAx1 from chair, RW utilized throughout. Difficulty maintaining weight bearing restrctions due to increased L foot plantarflexion. constant cueing to off weight LLE and to increase L heel weight bearing.The patient up in chair in NAD at end of session. The patient would benefit from further skilled PT intervention to continue to progress towards goals and return to PLOF.      Follow Up Recommendations  SNF     Equipment Recommendations  Other (comment);Wheelchair (measurements PT)    Recommendations for Other Services       Precautions / Restrictions Precautions Precautions: Fall Required Braces or Orthoses: Other Brace Other Brace: Orthowedge shoe Restrictions Weight Bearing Restrictions: Yes LLE Weight Bearing: Partial weight bearing LLE Partial Weight Bearing Percentage or Pounds: 50% Other Position/Activity Restrictions: OOB for toileting or chair transfer    Mobility  Bed Mobility Overal bed mobility: Needs Assistance Bed Mobility: Sidelying to Sit   Sidelying to sit: Min assist       General bed mobility  comments: utilized bed rails, minA for complete trunk elevation  Transfers Overall transfer level: Needs assistance Equipment used: Rolling walker (2 wheeled) Transfers: Sit to/from Stand Sit to Stand: From elevated surface;Max assist;Mod assist;+2 physical assistance         General transfer comment: Performed several times throughout session. maxAx2, improved to modAx1 from chair, RW utilized throughout. Difficulty maintaining weight bearing restrctions due to increased L foot plantarflexion. constant cueing to off weight LLE and to increase L heel weight bearing.  Ambulation/Gait Ambulation/Gait assistance: Mod assist;+2 physical assistance Gait Distance (Feet): 2 Feet         General Gait Details: Able to move LE with increased time, assistance needed to move RW.    Stairs             Wheelchair Mobility    Modified Rankin (Stroke Patients Only)       Balance Overall balance assessment: Needs assistance Sitting-balance support: Feet supported Sitting balance-Leahy Scale: Fair       Standing balance-Leahy Scale: Poor                              Cognition Arousal/Alertness: Awake/alert Behavior During Therapy: Flat affect Overall Cognitive Status: Within Functional Limits for tasks assessed                                 General Comments: Increased time needed for processing. Difficulty focusing and needs redirection to attend to task multiple times during session      Exercises Other Exercises  Other Exercises: Multiple attempts of sit to stands performed. Able to stand ~10min x2 to increase standing tolerance and understanding of weight bearing precautions.    General Comments        Pertinent Vitals/Pain Pain Assessment: 0-10 Pain Score: 2  Pain Location: L foot Pain Intervention(s): Limited activity within patient's tolerance;Patient requesting pain meds-RN notified;RN gave pain meds during session;Monitored during  session;Repositioned    Home Living                      Prior Function            PT Goals (current goals can now be found in the care plan section) Progress towards PT goals: Progressing toward goals    Frequency    7X/week      PT Plan Current plan remains appropriate    Co-evaluation              AM-PAC PT "6 Clicks" Mobility   Outcome Measure  Help needed turning from your back to your side while in a flat bed without using bedrails?: A Lot Help needed moving from lying on your back to sitting on the side of a flat bed without using bedrails?: A Lot Help needed moving to and from a bed to a chair (including a wheelchair)?: A Lot Help needed standing up from a chair using your arms (e.g., wheelchair or bedside chair)?: A Lot Help needed to walk in hospital room?: A Lot Help needed climbing 3-5 steps with a railing? : Total 6 Click Score: 11    End of Session Equipment Utilized During Treatment: Gait belt;Other (comment)(orthowedge shoe) Activity Tolerance: Patient limited by fatigue Patient left: with chair alarm set;in chair;with call bell/phone within reach;with SCD's reapplied Nurse Communication: Mobility status PT Visit Diagnosis: Muscle weakness (generalized) (M62.81);Difficulty in walking, not elsewhere classified (R26.2);History of falling (Z91.81)     Time: 1000-1046 PT Time Calculation (min) (ACUTE ONLY): 46 min  Charges:  $Therapeutic Exercise: 8-22 mins $Therapeutic Activity: 23-37 mins                     Lieutenant Diego PT, DPT 11:11 AM,10/31/18 [phone]

## 2018-10-31 NOTE — Progress Notes (Signed)
Pt said she was very sad, she grieved about her long hospital stay, and about her situation. She said she enjoys good support from her sisters. She grieved about the death of her only child four years ago, and the death of her mom. She reflected on incomplete life business. Chaplain cut the visit short because had another appointment coming up   10/31/18 1200  Clinical Encounter Type  Visited With Patient  Visit Type Initial  Referral From Nurse  Spiritual Encounters  Spiritual Needs Prayer;Emotional;Grief support;Other (Comment)  Stress Factors  Patient Stress Factors Exhausted;Family relationships;Health changes;Loss;Loss of control;Major life changes;Other (Comment)  . Chaplain plans to follow up later in the day.

## 2018-10-31 NOTE — Progress Notes (Signed)
   Date of Admission:  10/24/2018      ID: Yesenia Shepard is a 68 y.o. female   Subjective: No new issues Still has foley  Medications:  . aspirin EC  81 mg Oral Daily  . atorvastatin  40 mg Oral Daily  . clonazePAM  1 mg Oral BID  . clopidogrel  75 mg Oral Daily  . docusate sodium  200 mg Oral BID  . feeding supplement (GLUCERNA SHAKE)  237 mL Oral BID BM  . heparin  5,000 Units Subcutaneous Q8H  . mouth rinse  15 mL Mouth Rinse BID  . methadone  80 mg Oral Daily  . tamsulosin  0.4 mg Oral QPC supper    Objective: Vital signs in last 24 hours: Temp:  [98 F (36.7 C)-98.8 F (37.1 C)] 98 F (36.7 C) (04/24 1511) Pulse Rate:  [61-67] 62 (04/24 1511) Resp:  [18-19] 18 (04/24 1511) BP: (119-125)/(49-54) 125/54 (04/24 1511) SpO2:  [94 %-100 %] 96 % (04/24 1511) Weight:  [67.3 kg] 67.3 kg (04/24 0512)  PHYSICAL EXAM:  General: Alert, cooperative, no distress, appears stated age.  Lungs: Clear to auscultation bilaterally. No Wheezing or Rhonchi. No rales. Heart: Regular rate and rhythm, no murmur, rub or gallop. Abdomen: Soft, non-tender,not distended. Bowel sounds normal. No masses Extremities: left foot  5th ray amputation- site very clean , no swelling, erythema     Skin: No rashes or lesions. Or bruising Lymph: Cervical, supraclavicular normal. Neurologic: Grossly non-focal  Lab Results Recent Labs    10/30/18 0305 10/31/18 0445  WBC 9.6 11.0*  HGB 9.8* 9.8*  HCT 32.2* 32.1*  NA 140 141  K 3.4* 3.5  CL 101 104  CO2 29 28  BUN 25* 28*  CREATININE 3.24* 3.36*     Assessment/Plan: 68 y.o.femalewith a history ofPVD, HTN, Chronic left foot wound was admitted on 10/24/18 for worsening ulceration and infection of the left foot ? Peripheral vascular disease with severe ulceration and necrosis of the left foot 5th toearea-with osteomyelitis- s/p angio, had stent in left SFA and balloon angioplasty of TPA, post tibial and SFA  s/p 5th ray amputation and  I/D of the foot abscess. Bone culture has enterococcus This is a therapeutic amputation as discussed with Dr.Troxler  Currently on unasyn- will not need IV on discharge- may be able to give augmentin adjusted for crcl as OP (total of 14 days)  ( 11/11/18)  AKI- multifactorial -Vanco, contrast, borderline hypotension  urinary retention- followed by nephrology   DM- not on any meds-last Hba1c from 04/10/18 was 6.3  Treated hepatitis C infection  Discussed the management with patient and podiatrist ID will sign off- call if needed

## 2018-10-31 NOTE — Progress Notes (Signed)
[facility] Daily Progress Note   Subjective: 6 Days Post-Op:   1.Ultrasound guidance for vascular accessrightfemoral artery 2.Catheter placement into left common femoral artery from right femoral approach 3.Aortogram and selectiveleftlower extremity angiogram 4.Percutaneous transluminal angioplasty ofleft tibioperoneal trunk and proximal posterior tibial artery with 4 mm diameter by 6 cm length Lutonix drug-coated angioplasty balloon 5.Percutaneous transluminal angioplasty of the left SFA and most proximal popliteal artery with 5 mm diameter by 22 cm length Lutonix drug-coated angioplasty balloon 6.Stent placement to the left SFA with a 6 mm diameter by 20 cm length life stent for greater than 50% residual stenosis after angioplasty 7.StarClose closure device rightfemoral artery  Patient without complaint.   Objective: Vitals:   10/30/18 0801 10/30/18 1535 10/30/18 1947 10/31/18 0512  BP: (!) 120/49 (!) 114/51 (!) 119/53 (!) 122/49  Pulse: 61 61 61 67  Resp:  19 18 19   Temp: 98.9 F (37.2 C) 99.1 F (37.3 C) 98.8 F (37.1 C) 98.2 F (36.8 C)  TempSrc:   Oral Oral  SpO2: 97% 99% 100% 94%  Weight:    67.3 kg  Height:        Intake/Output Summary (Last 24 hours) at 10/31/2018 1026 Last data filed at 10/31/2018 1015 Gross per 24 hour  Intake 480 ml  Output 800 ml  Net -320 ml   Physical Exam: A&Ox3, NAD CV: RRR Pulmonary: CTA Bilaterally Abdomen: Soft, Nontender, Nondistended, (+) Bowel Sounds GU: Foley in place - draining clear urine Vascular:             Left Lower Extremity: Thigh soft, calf soft. Podiatry dressing intact, clean and dry. Extremity warm distally to toes.    Laboratory: CBC    Component Value Date/Time   WBC 11.0 (H) 10/31/2018 0445   HGB 9.8 (L) 10/31/2018 0445   HCT 32.1 (L) 10/31/2018 0445   PLT 313 10/31/2018 0445    BMET    Component Value Date/Time   NA 141 10/31/2018 0445   NA 139 02/04/2015 1305   K 3.5 10/31/2018 0445   CL 104 10/31/2018 0445   CO2 28 10/31/2018 0445   GLUCOSE 74 10/31/2018 0445   BUN 28 (H) 10/31/2018 0445   BUN 10 02/04/2015 1305   CREATININE 3.36 (H) 10/31/2018 0445   CREATININE 0.76 01/05/2016 1524   CALCIUM 8.3 (L) 10/31/2018 0445   GFRNONAA 13 (L) 10/31/2018 0445   GFRNONAA 83 01/05/2016 1524   GFRAA 15 (L) 10/31/2018 0445   GFRAA >89 01/05/2016 1524   Assessment/Planning: Thepatient is a 68 year old female with multiple medical issues including peripheral artery disease status post left lower extremity endovascular intervention, status postleft footfifth ray amputation/I&D abscess plantar deep spaceby podiatry 1) Patient continues to refuse to work with physical therapy.  Patient has not gotten out of bed to chair / ambulation as I have strongly recommended on a daily basis.  We have had numerous conversations in regard to the risk of postoperative complications being bedbound.  On a daily basis, the patient agrees to work with physical therapy, occupational therapy, get out of bed and ambulate however throughout the day will refuse the services.  The patient also discusses how she is depressed however is refusing spiritual or psychiatric consults however at this point feel I have to consult both.  The patient does have a history of self-neglect.  The patient has had a longstanding foot infection which she has missed numerous appointments/procedures.  The patient does have a disabled husband at home  which makes it hard for him to care for her.  She has agreed to rehab upon dispo however her creatinine continues to increase at this time nephrology is following. Will also consult palliative care at this time. 2) Will start Flomax for urinary retention requiring foley  Discussed with Dr. Ellis Parents Kember Boch PA-C 10/31/2018 10:26 AM

## 2018-10-31 NOTE — Consult Note (Signed)
Consultation Note Date: 10/31/2018   Patient Name: Yesenia Shepard  DOB: [birth year]  MRN: [id number]  Age / Sex: 68 y.o., female  PCP: [name], [name], MD Referring Physician: Algernon Huxley, MD  Reason for Consultation: Establishing goals of care  HPI/Patient Profile: ARANTXA PIERCEY is a 68 y.o. female that presented to a vascular appt with concerns of a possible left foot infection.   Clinical Assessment and Goals of Care: Patient is sitting in bedside chair. She lives with her boyfriend who is disabled. Her daughter died in hospice care from cancer 4 years ago. She had to put her pet down a month and a half ago.  She has 2 sisters who would be her surrogate decision maker if she was unable.   We discussed her diagnosis, prognosis, GOC, EOL wishes disposition and options.  A detailed discussion was had today regarding advanced directives.  Concepts specific to code status, artifical feeding and hydration, IV antibiotics and rehospitalization were discussed.  The difference between an aggressive medical intervention path and a comfort care path was discussed.  Values and goals of care important to patient and family were attempted to be elicited.  Discussed limitations of medical interventions to prolong quality of life in some situations, and discussed the concept of human mortality. We discussed quality of life vs quantity of life.   She states she believes in God and is a [place], but states it is very hard to have no control over anything. She states she did not see the last few years of her life being spent like this. She states it would be so good to go to heaven, but as long as she can continue to do things for herself, and have independence, she would like to continue to treat the treatable.   She states she would never want a leg amputation. She would never want a feeding tube as her father had one  before he died. She would not want to be placed on a ventilator, but would want CPR. Explained that a person medically warranting a ventilator that chose not to have one in favor of comfort care would likely die. Explained that following CPR, a person would likely need a ventilator even short term for recovery. She states she will consider this.   She states she missed appointments because she did not know she had them. She states she wants to continue to treat the treatable and will work with therapies and work on good nutrition.   SUMMARY OF RECOMMENDATIONS   Recommend palliative at D/C.   Do agree with chaplain as she has spiritual questions. Also agree with psychiatry consult as she has had a lot of loss.   Code Status/Advance Care Planning:  Full code   Prognosis:   Unable to determine  Discharge Planning: To Be Determined      Primary Diagnoses: Present on Admission: . Atherosclerosis of artery of extremity with ulceration ([place])   I have reviewed the medical record, interviewed the patient and family, and  examined the patient. The following aspects are pertinent.  Past Medical History:  Diagnosis Date  . Anxiety   . Arthritis   . Chronic pain   . Depression   . Drug abuse ([place])    History of polysubstance abuse; currently on methadone.  . Hepatitis    History of Hep "C". treated and cured with Harvoni  . History of chicken pox   . Hypertension   . Panic attacks   . Peripheral vascular disease ([place])    stent in place.   . Pre-diabetes   . UTI (urinary tract infection)    History of   Social History   Socioeconomic History  . Marital status: Divorced    Spouse name: Not on file  . Number of children: Not on file  . Years of education: Not on file  . Highest education level: Not on file  Occupational History  . Not on file  Social Needs  . Financial resource strain: Not hard at all  . Food insecurity:    Worry: Never true    Inability: Never true  .  Transportation needs:    Medical: No    Non-medical: No  Tobacco Use  . Smoking status: Former Smoker    Packs/day: 0.50    Years: 30.00    Pack years: 15.00    Types: Cigarettes    Last attempt to quit: 07/25/2018    Years since quitting: 0.2  . Smokeless tobacco: Never Used  . Tobacco comment: Currently smoking 1/2 ppd  Substance and Sexual Activity  . Alcohol use: No    Alcohol/week: 0.0 standard drinks  . Drug use: No    Comment: Hx of.  . Sexual activity: Not Currently  Lifestyle  . Physical activity:    Days per week: 0 days    Minutes per session: Not on file  . Stress: Not on file  Relationships  . Social connections:    Talks on phone: Not on file    Gets together: Not on file    Attends religious service: Not on file    Active member of club or organization: Not on file    Attends meetings of clubs or organizations: Not on file    Relationship status: Not on file  Other Topics Concern  . Not on file  Social History Narrative   1 daughter died in 2016/10/05    Lives at home not alone    Family History  Problem Relation Age of Onset  . Arthritis Mother   . Mental illness Mother        depression  . Diabetes Mother   . Cancer Mother        pancreatic  . Cancer Father        colon  . Heart disease Father   . Diabetes Father   . Cancer Daughter        lung   Scheduled Meds: . aspirin EC  81 mg Oral Daily  . atorvastatin  40 mg Oral Daily  . clonazePAM  1 mg Oral BID  . clopidogrel  75 mg Oral Daily  . docusate sodium  200 mg Oral BID  . feeding supplement (GLUCERNA SHAKE)  237 mL Oral BID BM  . heparin  5,000 Units Subcutaneous Q8H  . mouth rinse  15 mL Mouth Rinse BID  . methadone  80 mg Oral Daily  . tamsulosin  0.4 mg Oral QPC supper   Continuous Infusions: . sodium chloride Stopped (10/27/18 1259)  .  ampicillin-sulbactam (UNASYN) IV 3 g (10/31/18 0929)   PRN Meds:.sodium chloride, acetaminophen **OR** acetaminophen, ondansetron **OR** ondansetron  (ZOFRAN) IV, polyethylene glycol, sorbitol, traMADol Medications Prior to Admission:  Prior to Admission medications   Medication Sig Start Date End Date Taking? Authorizing Provider  aspirin 81 MG tablet Take 81 mg by mouth daily.   Yes [provider]  atorvastatin (LIPITOR) 40 MG tablet Take 1 tablet (40 mg total) by mouth daily. Patient taking differently: Take 40 mg by mouth every other day.  04/10/18  Yes [name], [name], MD  cephALEXin (KEFLEX) 500 MG capsule Take 1 capsule (500 mg total) by mouth 3 (three) times daily. 10/21/18  Yes Kris Hartmann, NP  clonazePAM (KLONOPIN) 2 MG tablet Take 2 mg by mouth 2 (two) times daily.  12/08/16  Yes [provider]  methadone (DOLOPHINE) 10 MG/5ML solution Take 110 mg by mouth daily.    Yes [provider]  clopidogrel (PLAVIX) 75 MG tablet TAKE 1 TABLET BY MOUTH ONCE DAILY Patient not taking: No sig reported 12/23/17   Schnier, Dolores Lory, MD  docusate sodium (COLACE) 100 MG capsule Take 200 mg by mouth 2 (two) times daily.     [provider]  furosemide (LASIX) 20 MG tablet Take 1 tablet (20 mg total) by mouth daily. In am Patient not taking: Reported on 10/03/2018 04/10/18   [name], [name], MD  HYDROcodone-acetaminophen (NORCO) 5-325 MG tablet Take 1-2 tablets by mouth every 6 (six) hours as needed for moderate pain or severe pain. Patient not taking: Reported on 06/02/2018 02/27/18   Schnier, Dolores Lory, MD  lisinopril (PRINIVIL,ZESTRIL) 10 MG tablet Take 1 tablet (10 mg total) by mouth daily. Patient not taking: Reported on 10/24/2018 04/23/18   [name], [name], MD  meloxicam (MOBIC) 15 MG tablet Take 1 tablet (15 mg total) by mouth daily as needed for pain. Patient not taking: Reported on 06/02/2018 04/23/18   [name], [name], MD  polyethylene glycol (MIRALAX / GLYCOLAX) packet Take 17 g by mouth daily as needed for mild constipation.    [provider]   No Known  Allergies Review of Systems  All other systems reviewed and are negative.   Physical Exam Pulmonary:     Effort: Pulmonary effort is normal.  Neurological:     Mental Status: She is alert.     Vital Signs: BP (!) 122/49 (BP Location: Right Arm)   Pulse 67   Temp 98.2 F (36.8 C) (Oral)   Resp 19   Ht 5\' 5"  (1.651 m)   Wt 67.3 kg   SpO2 94%   BMI 24.69 kg/m  Pain Scale: 0-10 POSS *See Group Information*: 2-Acceptable,Slightly drowsy, easily aroused Pain Score: 5    SpO2: SpO2: 94 % O2 Device:SpO2: 94 % O2 Flow Rate: .O2 Flow Rate (L/min): 1 L/min  IO: Intake/output summary:   Intake/Output Summary (Last 24 hours) at 10/31/2018 1216 Last data filed at 10/31/2018 1015 Gross per 24 hour  Intake 480 ml  Output 800 ml  Net -320 ml    LBM: Last BM Date: 10/31/18 Baseline Weight: Weight: 77.1 kg Most recent weight: Weight: 67.3 kg     Palliative Assessment/Data:     Time In: 11:35 Time Out: 12:25 Time Total: 50 min Greater than 50%  of this time was spent counseling and coordinating care related to the above assessment and plan.  Signed by: Asencion Gowda, NP   Please contact Palliative Medicine Team phone at [phone] for  questions and concerns.  For individual provider: See Amion             

## 2018-10-31 NOTE — Consult Note (Signed)
Yes[facility] Psychiatry Consult   Reason for Consult:  Depression  Referring Physician:  Marcelle Overlie, PA Patient Identification: Yesenia Shepard MRN:  [id number] Principal Diagnosis: Anxiety and depression Diagnosis:  Principal Problem:   Anxiety and depression Active Problems:   Atherosclerosis of artery of extremity with ulceration ([place])   Total Time spent with patient: 1 hour  Subjective:   DNIYA Shepard is a 68 y.o. female patient reports a 6/10 for depression and anxiety with no suicidal ideations.  She reports Dr. Cherylann Banas Georga Bora) prescribed her Abilify and Lexapro in the past and it worked for her.  She would like to restart this, Dr Dwyane Dee reviewed this client and concurs with the recommendation of Lexapro 5 mg daily and Abilify 2 mg daily for depression and anxiety.  HPI:  68 yo female who presented to the hospital for care of left foot issues.  Seen in her room while sitting in her recliner, cooperative with some anxiety. She had surgery and states the plan is for her to go to rehabilitation after discharge.   [name] is "not happy" about this but resigned to it.  She currently lives with her boyfriend who is no longer able to care for her.  Reports her PCP, Dr Cherylann Banas prescribed Lexapro and Abilify to her in the past for depression and anxiety and it worked for her. She would like to restart it.  6/10 for depression and anxiety, denies suicidal ideations and past history of suicidal ideations.  Stated she was started on Klonopin 1 mg BID about six months ago for anxiety, effective until lately with increase in stressors.  Depression and anxiety have increased with recent health issues and rehab placement along with the grief of her pet and loved ones.  Medications do help her depression and anxiety along with social support.  No homicidal ideations or hallucinations or substance abuse.  Her sleep is "Ok" and feels rested upon awakening.  "I'm hungry but food is not good here."  She is  bothered about not knowing which rehab she is discharging to, only knows it is in [place].  Past Psychiatric History: depression and anxiety  Risk to Self:  none Risk to Others:  none Prior Inpatient Therapy:  none Prior Outpatient Therapy:  PCP  Past Medical History:  Past Medical History:  Diagnosis Date  . Anxiety   . Arthritis   . Chronic pain   . Depression   . Drug abuse ([place])    History of polysubstance abuse; currently on methadone.  . Hepatitis    History of Hep "C". treated and cured with Harvoni  . History of chicken pox   . Hypertension   . Panic attacks   . Peripheral vascular disease ([place])    stent in place.   . Pre-diabetes   . UTI (urinary tract infection)    History of    Past Surgical History:  Procedure Laterality Date  . ABDOMINAL HYSTERECTOMY  1989  . AMPUTATION TOE Left 10/26/2018   Procedure: 5TH RAY RESCTION;  Surgeon: Albertine Patricia, DPM;  Location: [facility] ORS;  Service: Podiatry;  Laterality: Left;  . CHOLECYSTECTOMY  1987  . INTRACAPSULAR CATARACT EXTRACTION Left   . LOWER EXTREMITY ANGIOGRAPHY Left 12/17/2017   Procedure: LOWER EXTREMITY ANGIOGRAPHY;  Surgeon: Katha Cabal, MD;  Location: [facility];  Service: Cardiovascular;  Laterality: Left;  . LOWER EXTREMITY ANGIOGRAPHY Left 10/24/2018   Procedure: LOWER EXTREMITY ANGIOGRAPHY;  Surgeon: Algernon Huxley, MD;  Location: [facility]  INVASIVE CV LAB;  Service: Cardiovascular;  Laterality: Left;  . PERIPHERAL VASCULAR CATHETERIZATION Left 05/04/2015   Procedure: Lower Extremity Angiography;  Surgeon: Katha Cabal, MD;  Location: [facility];  Service: Cardiovascular;  Laterality: Left;  . PERIPHERAL VASCULAR CATHETERIZATION Left 05/04/2015   Procedure: Lower Extremity Intervention;  Surgeon: Katha Cabal, MD;  Location: [facility];  Service: Cardiovascular;  Laterality: Left;  . SHOULDER ARTHROSCOPY WITH OPEN ROTATOR CUFF REPAIR Right 01/16/2017   Procedure:  SHOULDER ARTHROSCOPY WITH OPEN ROTATOR CUFF REPAIR;  Surgeon: Thornton Park, MD;  Location: [facility] ORS;  Service: Orthopedics;  Laterality: Right;  . TONSILLECTOMY AND ADENOIDECTOMY  1971   Family History:  Family History  Problem Relation Age of Onset  . Arthritis Mother   . Mental illness Mother        depression  . Diabetes Mother   . Cancer Mother        pancreatic  . Cancer Father        colon  . Heart disease Father   . Diabetes Father   . Cancer Daughter        lung   Family Psychiatric  History: mother with depression Social History:  Social History   Substance and Sexual Activity  Alcohol Use No  . Alcohol/week: 0.0 standard drinks     Social History   Substance and Sexual Activity  Drug Use No   Comment: Hx of.    Social History   Socioeconomic History  . Marital status: Divorced    Spouse name: Not on file  . Number of children: Not on file  . Years of education: Not on file  . Highest education level: Not on file  Occupational History  . Not on file  Social Needs  . Financial resource strain: Not hard at all  . Food insecurity:    Worry: Never true    Inability: Never true  . Transportation needs:    Medical: No    Non-medical: No  Tobacco Use  . Smoking status: Former Smoker    Packs/day: 0.50    Years: 30.00    Pack years: 15.00    Types: Cigarettes    Last attempt to quit: 07/25/2018    Years since quitting: 0.2  . Smokeless tobacco: Never Used  . Tobacco comment: Currently smoking 1/2 ppd  Substance and Sexual Activity  . Alcohol use: No    Alcohol/week: 0.0 standard drinks  . Drug use: No    Comment: Hx of.  . Sexual activity: Not Currently  Lifestyle  . Physical activity:    Days per week: 0 days    Minutes per session: Not on file  . Stress: Not on file  Relationships  . Social connections:    Talks on phone: Not on file    Gets together: Not on file    Attends religious service: Not on file    Active member of club or  organization: Not on file    Attends meetings of clubs or organizations: Not on file    Relationship status: Not on file  Other Topics Concern  . Not on file  Social History Narrative   1 daughter died in 10/06/2016    Lives at home not alone    Additional Social History:    Allergies:  No Known Allergies  Labs:  Results for orders placed or performed during the hospital encounter of 10/24/18 (from the past 48 hour(s))  CBC  Status: Abnormal   Collection Time: 10/30/18  3:05 AM  Result Value Ref Range   WBC 9.6 4.0 - 10.5 K/uL   RBC 3.48 (L) 3.87 - 5.11 MIL/uL   Hemoglobin 9.8 (L) 12.0 - 15.0 g/dL   HCT 32.2 (L) 36.0 - 46.0 %   MCV 92.5 80.0 - 100.0 fL   MCH 28.2 26.0 - 34.0 pg   MCHC 30.4 30.0 - 36.0 g/dL   RDW 13.2 11.5 - 15.5 %   Platelets 295 150 - 400 K/uL   nRBC 0.0 0.0 - 0.2 %    Comment: Performed at [facility], [address] [place], [place] [address]  Basic metabolic panel     Status: Abnormal   Collection Time: 10/30/18  3:05 AM  Result Value Ref Range   Sodium 140 135 - 145 mmol/L   Potassium 3.4 (L) 3.5 - 5.1 mmol/L   Chloride 101 98 - 111 mmol/L   CO2 29 22 - 32 mmol/L   Glucose, Bld 85 70 - 99 mg/dL   BUN 25 (H) 8 - 23 mg/dL   Creatinine, Ser 3.24 (H) 0.44 - 1.00 mg/dL   Calcium 8.1 (L) 8.9 - 10.3 mg/dL   GFR calc non Af Amer 14 (L) >60 mL/min   GFR calc Af Amer 16 (L) >60 mL/min   Anion gap 10 5 - 15    Comment: Performed at [facility], [address] [place], [place] [address]  Magnesium     Status: Abnormal   Collection Time: 10/30/18  3:05 AM  Result Value Ref Range   Magnesium 2.5 (H) 1.7 - 2.4 mg/dL    Comment: Performed at [facility], [address] [place], [place] [address]  CBC     Status: Abnormal   Collection Time: 10/31/18  4:45 AM  Result Value Ref Range   WBC 11.0 (H) 4.0 - 10.5 K/uL   RBC 3.54 (L) 3.87 - 5.11 MIL/uL   Hemoglobin 9.8 (L) 12.0 - 15.0 g/dL   HCT 32.1 (L) 36.0 - 46.0 %   MCV 90.7  80.0 - 100.0 fL   MCH 27.7 26.0 - 34.0 pg   MCHC 30.5 30.0 - 36.0 g/dL   RDW 13.2 11.5 - 15.5 %   Platelets 313 150 - 400 K/uL   nRBC 0.0 0.0 - 0.2 %    Comment: Performed at [facility], [address] [place], [place] [address]  Basic metabolic panel     Status: Abnormal   Collection Time: 10/31/18  4:45 AM  Result Value Ref Range   Sodium 141 135 - 145 mmol/L   Potassium 3.5 3.5 - 5.1 mmol/L   Chloride 104 98 - 111 mmol/L   CO2 28 22 - 32 mmol/L   Glucose, Bld 74 70 - 99 mg/dL   BUN 28 (H) 8 - 23 mg/dL   Creatinine, Ser 3.36 (H) 0.44 - 1.00 mg/dL   Calcium 8.3 (L) 8.9 - 10.3 mg/dL   GFR calc non Af Amer 13 (L) >60 mL/min   GFR calc Af Amer 15 (L) >60 mL/min   Anion gap 9 5 - 15    Comment: Performed at [facility], [address] [place],  [address]  Magnesium     Status: Abnormal   Collection Time: 10/31/18  4:45 AM  Result Value Ref Range   Magnesium 2.6 (H) 1.7 - 2.4 mg/dL    Comment: Performed at [facility], [address],  [place], [place] [address]    Current Facility-Administered Medications  Medication Dose Route Frequency Provider Last Rate Last Dose  . 0.9 %  sodium chloride infusion   Intravenous PRN Algernon Huxley, MD   Stopped at 10/27/18 1259  . acetaminophen (TYLENOL) tablet 650 mg  650 mg Oral Q6H PRN Algernon Huxley, MD       Or  . acetaminophen (TYLENOL) suppository 650 mg  650 mg Rectal Q6H PRN Algernon Huxley, MD      . Ampicillin-Sulbactam (UNASYN) 3 g in sodium chloride 0.9 % 100 mL IVPB  3 g Intravenous Q12H Berton Mount, RPH 200 mL/hr at 10/31/18 0929 3 g at 10/31/18 0929  . aspirin EC tablet 81 mg  81 mg Oral Daily Algernon Huxley, MD   81 mg at 10/31/18 9242  . atorvastatin (LIPITOR) tablet 40 mg  40 mg Oral Daily Algernon Huxley, MD   40 mg at 10/31/18 0926  . clonazePAM (KLONOPIN) tablet 1 mg  1 mg Oral BID Stegmayer, Kimberly A, PA-C   1 mg at 10/31/18 0926  . clopidogrel (PLAVIX) tablet 75 mg  75 mg Oral Daily  Algernon Huxley, MD   75 mg at 10/31/18 0926  . docusate sodium (COLACE) capsule 200 mg  200 mg Oral BID Algernon Huxley, MD   200 mg at 10/31/18 0926  . feeding supplement (GLUCERNA SHAKE) (GLUCERNA SHAKE) liquid 237 mL  237 mL Oral BID BM Singh, Harmeet, MD   237 mL at 10/31/18 1140  . heparin injection 5,000 Units  5,000 Units Subcutaneous Q8H Algernon Huxley, MD   5,000 Units at 10/31/18 0603  . MEDLINE mouth rinse  15 mL Mouth Rinse BID Mansy, Alfredia A, MD   15 mL at 10/31/18 6834  . methadone (DOLOPHINE) 10 MG/ML solution 80 mg  80 mg Oral Daily Stegmayer, Kimberly A, PA-C   80 mg at 10/31/18 1008  . ondansetron (ZOFRAN) tablet 4 mg  4 mg Oral Q6H PRN Algernon Huxley, MD       Or  . ondansetron (ZOFRAN) injection 4 mg  4 mg Intravenous Q6H PRN Algernon Huxley, MD      . polyethylene glycol (MIRALAX / GLYCOLAX) packet 17 g  17 g Oral Daily PRN Algernon Huxley, MD      . sorbitol 70 % solution 30 mL  30 mL Oral Daily PRN Algernon Huxley, MD      . tamsulosin (FLOMAX) capsule 0.4 mg  0.4 mg Oral QPC supper Stegmayer, Kimberly A, PA-C      . traMADol (ULTRAM) tablet 50 mg  50 mg Oral Q6H PRN Stegmayer, Kimberly A, PA-C   50 mg at 10/29/18 2034   Facility-Administered Medications Ordered in Other Encounters  Medication Dose Route Frequency Provider Last Rate Last Dose  . ceFAZolin (ANCEF) IVPB 2g/100 mL premix  2 g Intravenous Once Kris Hartmann, NP        Musculoskeletal: Strength & Muscle Tone: decreased Gait & Station: did not witness Patient leans: N/A  Psychiatric Specialty Exam: Physical Exam  Nursing note and vitals reviewed. Constitutional: She is oriented to person, place, and time. She appears well-developed.  HENT:  Head: Normocephalic.  Neck: Normal range of motion.  Respiratory: Effort normal.  Musculoskeletal:     Comments: Left foot discomfort related to surgery  Neurological: She is alert and oriented to person, place, and time.  Psychiatric: Her speech is normal and behavior is normal.  Judgment and thought content normal. Her mood appears anxious. Cognition and memory are normal. She exhibits a depressed mood.    Review of Systems  Constitutional: Negative.   HENT: Negative.   Eyes: Negative.   Cardiovascular: Negative.   Gastrointestinal: Negative.   Genitourinary: Negative.   Musculoskeletal:       Left foot discomfort related to surgery  Skin: Negative.   Neurological: Negative.   Endo/Heme/Allergies: Negative.   Psychiatric/Behavioral: The patient is nervous/anxious.     Blood pressure (!) 122/49, pulse 67, temperature 98.2 F (36.8 C), temperature source Oral, resp. rate 19, height 5\' 5"  (1.651 m), weight 67.3 kg, SpO2 94 %.Body mass index is 24.69 kg/m.  General Appearance: Casual  Eye Contact:  Good  Speech:  Normal Rate  Volume:  Decreased  Mood:  Anxious and Depressed  Affect:  Congruent  Thought Process:  Coherent and Descriptions of Associations: Intact  Orientation:  Full (Time, Place, and Person)  Thought Content:  WDL and Logical  Suicidal Thoughts:  No  Homicidal Thoughts:  No  Memory:  Immediate;   Good Recent;   Good Remote;   Good  Judgement:  Fair  Insight:  Good  Psychomotor Activity:  Decreased  Concentration:  Concentration: Good and Attention Span: Good  Recall:  Good  Fund of Knowledge:  Good  Language:  Good  Akathisia:  No  Handed:  Right  AIMS (if indicated):     Assets:  Housing Leisure Time Resilience Social Support  ADL's:  Impaired  Cognition:  WNL  Sleep:        Treatment Plan Summary: Depression: -Recommend starting Lexapro 5 mg daily as she reports this worked for her in the past with Abilify -Recommend starting Abilify 2 mg daily  Anxiety: -Continue Klonopin 1 mg BID with goal of decreasing this medication as the Lexapro and Abilify become more effective  Follow up with outpatient provider after discharge.  Not recommended for inpatient psychiatry at this time.    Disposition: No evidence of imminent  risk to self or others at present.   Patient does not meet criteria for psychiatric inpatient admission. Supportive therapy provided about ongoing stressors.  Waylan [place], NP 10/31/2018 12:49 PM

## 2018-10-31 NOTE — Progress Notes (Signed)
Pharmacy Antibiotic Note  Yesenia Shepard is a 68 y.o. female admitted on 10/24/2018 with osteomyelitis.  Pharmacy has been consulted for vancomycin dosing. Patient is also on pip/tazo  Today, 10/31/2018  Random vancomycin level = 39 4/21 at 10:24 - dose given by time lab resulted and pharmacy called RN.  Her last vanco dose was 4/21 at 11:44am on a dose of 750mg  IV q12h. Dose was changed - no doses given since 4/21 am  Bone cx = E. Faecalis with susc pending  Discussed rec for medication changes with vascular surgery (recommendations accepted)  Discussed AKI and cx results with Dr Delaine Lame and will change to amp/sulbactam to avoid nephrotoxicity of current regimen  Plan: Current CrCl: 14.4 and Scr: 3.36. Since it is under 15 and patient's Scr level continues to worsen, will extend dosing interval.   Will order Amp/sulbactam 3gm IV q24h.  Continue to follow renal function closely and adjust accordingly  Height: 5\' 5"  (165.1 cm) Weight: 148 lb 5.9 oz (67.3 kg) IBW/kg (Calculated) : 57  Temp (24hrs), Avg:98.7 F (37.1 C), Min:98.2 F (36.8 C), Max:99.1 F (37.3 C)  Recent Labs  Lab 10/24/18 1606 10/25/18 0523 10/28/18 1024 10/29/18 0947 10/30/18 0305 10/31/18 0445  WBC 9.9 6.8  --  8.6 9.6 11.0*  CREATININE 0.73 0.86  --  3.20* 3.24* 3.36*  VANCORANDOM  --   --  39 40  --   --     Estimated Creatinine Clearance: 14.4 mL/min (A) (by C-G formula based on SCr of 3.36 mg/dL (H)).    No Known Allergies  Antimicrobials this admission: 04/22 Unasyn>> 0417 Zosyn >> 4/22 0419 vancomycin >> 4/22   Microbiology results: 0419 Surgical/deep wound culture: E. Faecalis (susc pending) [phone] MRSA PCR: negative  Thank you for allowing pharmacy to be a part of this patient's care.  Pearla Dubonnet, PharmD Clinical Pharmacist 10/31/2018 3:00 PM

## 2018-10-31 NOTE — Progress Notes (Signed)
[place], [place] 10/31/18  Subjective:   Patient is doing about the same.  She states she walked with physical therapist today.  Oral intake remains poor because patient states she does not like hospital food.  Denies any shortness of breath  Objective:  Vital signs in last 24 hours:  Temp:  [98.2 F (36.8 C)-99.1 F (37.3 C)] 98.2 F (36.8 C) (04/24 0512) Pulse Rate:  [61-67] 67 (04/24 0512) Resp:  [18-19] 19 (04/24 0512) BP: (114-122)/(49-53) 122/49 (04/24 0512) SpO2:  [94 %-100 %] 94 % (04/24 0512) Weight:  [67.3 kg] 67.3 kg (04/24 0512)  Weight change: 1.2 kg Filed Weights   10/29/18 0500 10/30/18 0458 10/31/18 0512  Weight: 67.4 kg 66.1 kg 67.3 kg    Intake/Output:    Intake/Output Summary (Last 24 hours) at 10/31/2018 1110 Last data filed at 10/31/2018 1015 Gross per 24 hour  Intake 480 ml  Output 800 ml  Net -320 ml     Physical Exam: General:  No acute distress, sitting up in the chair in the room  HEENT  anicteric, moist oral mucous membranes  Neck  supple  Pulm/lungs Room air, normal breathing effort, clear  CVS/Heart  regular rhythm  Abdomen:   Soft, nontender  Extremities:  Left foot in special boot and bandage  Neurologic:  Alert, able to answer questions appropriately  Skin:  No acute rashes    Basic Metabolic Panel:  Recent Labs  Lab 10/24/18 1302 10/24/18 1606  10/25/18 0523 10/29/18 0947 10/30/18 0305 10/31/18 0445  NA  --   --   --  140 141 140 141  K  --   --   --  3.5 4.1 3.4* 3.5  CL  --   --   --  100 103 101 104  CO2  --   --   --  31 29 29 28   GLUCOSE  --   --   --  123* 129* 85 74  BUN 11  --   --  8 23 25* 28*  CREATININE 0.78 0.73  --  0.86 3.20* 3.24* 3.36*  CALCIUM  --   --    < > 8.4* 8.1* 8.1* 8.3*  MG  --   --   --   --   --  2.5* 2.6*   < > = values in this interval not displayed.     CBC: Recent Labs  Lab 10/24/18 1606 10/25/18 0523 10/29/18 0947 10/30/18 0305 10/31/18 0445   WBC 9.9 6.8 8.6 9.6 11.0*  HGB 10.5* 10.7* 10.2* 9.8* 9.8*  HCT 33.4* 34.5* 33.7* 32.2* 32.1*  MCV 89.8 90.8 92.6 92.5 90.7  PLT 330 312 303 295 313      Lab Results  Component Value Date   HEPBSAG NEGATIVE 10/18/2015   HEPBSAB NEG 10/18/2015      Microbiology:  Recent Results (from the past 240 hour(s))  Surgical pcr screen     Status: None   Collection Time: 10/25/18  3:49 AM  Result Value Ref Range Status   MRSA, PCR NEGATIVE NEGATIVE Final   Staphylococcus aureus NEGATIVE NEGATIVE Final    Comment: (NOTE) The Xpert SA Assay (FDA approved for NASAL specimens in patients 54 years of age and older), is one component of a comprehensive surveillance program. It is not intended to diagnose infection nor to guide or monitor treatment. Performed at [facility], [address] [place], [place] [address]   Aerobic/Anaerobic Culture (surgical/deep wound)  Status: None (Preliminary result)   Collection Time: 10/26/18  8:29 AM  Result Value Ref Range Status   Specimen Description   Final    BONE 5TH METATARSAL Performed at [facility], [address] [place], [place] [address]    Special Requests   Final    NONE Performed at [facility], [place], [place] [address]    Gram Stain   Final    MODERATE WBC PRESENT, PREDOMINANTLY PMN FEW GRAM POSITIVE COCCI Performed at [facility], [place] [address], [place], [place] [address]    Culture   Final    MODERATE GRAM POSITIVE RODS MODERATE ENTEROCOCCUS FAECALIS SUSCEPTIBILITIES PERFORMED ON PREVIOUS CULTURE WITHIN THE LAST 5 DAYS. NO ANAEROBES ISOLATED; CULTURE IN PROGRESS FOR 5 DAYS    Report Status PENDING  Incomplete  Aerobic/Anaerobic Culture (surgical/deep wound)     Status: None (Preliminary result)   Collection Time: 10/26/18  8:33 AM  Result Value Ref Range Status   Specimen Description   Final    BONE 5TH METATARSAL Performed at [facility], [place]  [address] [place] , [place] [address]    Special Requests   Final    NONE Performed at [facility], [place], [place] [address]    Gram Stain   Final    ABUNDANT WBC PRESENT, PREDOMINANTLY PMN FEW GRAM POSITIVE COCCI Performed at [facility], [place] [address] [place], [place] [address]    Culture   Final    MODERATE GRAM POSITIVE RODS MODERATE ENTEROCOCCUS FAECALIS NO ANAEROBES ISOLATED; CULTURE IN PROGRESS FOR 5 DAYS    Report Status PENDING  Incomplete   Organism ID, Bacteria ENTEROCOCCUS FAECALIS  Final      Susceptibility   Enterococcus faecalis - MIC*    AMPICILLIN <=2 SENSITIVE Sensitive     VANCOMYCIN 1 SENSITIVE Sensitive     GENTAMICIN SYNERGY SENSITIVE Sensitive     * MODERATE ENTEROCOCCUS FAECALIS    Coagulation Studies: No results for input(s): LABPROT, INR in the last 72 hours.  Urinalysis: No results for input(s): COLORURINE, LABSPEC, PHURINE, GLUCOSEU, HGBUR, BILIRUBINUR, KETONESUR, PROTEINUR, UROBILINOGEN, NITRITE, LEUKOCYTESUR in the last 72 hours.  Invalid input(s): APPERANCEUR    Imaging: No results found.   Medications:   . sodium chloride Stopped (10/27/18 1259)  . ampicillin-sulbactam (UNASYN) IV 3 g (10/31/18 0929)   . aspirin EC  81 mg Oral Daily  . atorvastatin  40 mg Oral Daily  . clonazePAM  1 mg Oral BID  . clopidogrel  75 mg Oral Daily  . docusate sodium  200 mg Oral BID  . heparin  5,000 Units Subcutaneous Q8H  . mouth rinse  15 mL Mouth Rinse BID  . methadone  80 mg Oral Daily  . tamsulosin  0.4 mg Oral QPC supper   sodium chloride, acetaminophen **OR** acetaminophen, ondansetron **OR** ondansetron (ZOFRAN) IV, polyethylene glycol, sorbitol, traMADol  Assessment/ Plan:  68 y.o. Caucasian female with medical problems of peripheral vascular disease, hypertension, chronic left foot ulcer, depression, polysubstance abuse, history of hep C treated, who was admitted to [facility] on 10/24/2018 for evaluation of left  foot wound and underwent angiogram, PTA of left leg and then subsequently underwent left fifth ray amputation and I&D of plantar abscess on 10/26/2018   1.  Acute kidney injury Likely secondary to ATN secondary to IV contrast exposure as well as vancomycin toxicity Baseline creatinine is 0.73 from October 24, 2018.  Today's  creatinine remains elevated at 3.36 although no significant change compared to yesterday.  This may mean that serum creatinine is stabilizing. -Patient is able to take adequate oral intake.  Avoid IV fluids or diuretics unless absolutely necessary Electrolytes and volume status are acceptable.  No acute indication for dialysis at present -Now that offending agents have been stopped, renal function is expected to improve slowly  2.  Hypokalemia -Agree with potassium replacement as needed  We will continue to follow   LOS: [place] 4/24/202011:10 [place], [place]  Note: This note was prepared with Dragon dictation. Any transcription errors are unintentional

## 2018-11-01 LAB — CBC
HCT: 32.5 % — ABNORMAL LOW (ref 36.0–46.0)
Hemoglobin: 10 g/dL — ABNORMAL LOW (ref 12.0–15.0)
MCH: 27.9 pg (ref 26.0–34.0)
MCHC: 30.8 g/dL (ref 30.0–36.0)
MCV: 90.5 fL (ref 80.0–100.0)
Platelets: 319 10*3/uL (ref 150–400)
RBC: 3.59 MIL/uL — ABNORMAL LOW (ref 3.87–5.11)
RDW: 13.1 % (ref 11.5–15.5)
WBC: 11.5 10*3/uL — ABNORMAL HIGH (ref 4.0–10.5)
nRBC: 0 % (ref 0.0–0.2)

## 2018-11-01 LAB — BASIC METABOLIC PANEL
Anion gap: 10 (ref 5–15)
BUN: 32 mg/dL — ABNORMAL HIGH (ref 8–23)
CO2: 25 mmol/L (ref 22–32)
Calcium: 8.2 mg/dL — ABNORMAL LOW (ref 8.9–10.3)
Chloride: 104 mmol/L (ref 98–111)
Creatinine, Ser: 3.32 mg/dL — ABNORMAL HIGH (ref 0.44–1.00)
GFR calc Af Amer: 16 mL/min — ABNORMAL LOW (ref >60)
GFR calc non Af Amer: 14 mL/min — ABNORMAL LOW (ref >60)
Glucose, Bld: 82 mg/dL (ref 70–99)
Potassium: 3 mmol/L — ABNORMAL LOW (ref 3.5–5.1)
Sodium: 139 mmol/L (ref 135–145)

## 2018-11-01 LAB — MAGNESIUM: Magnesium: 2.6 mg/dL — ABNORMAL HIGH (ref 1.7–2.4)

## 2018-11-01 LAB — AEROBIC/ANAEROBIC CULTURE W GRAM STAIN (SURGICAL/DEEP WOUND)

## 2018-11-01 LAB — NOVEL CORONAVIRUS, NAA (HOSP ORDER, SEND-OUT TO REF LAB; TAT 18-24 HRS): SARS-CoV-2, NAA: NOT DETECTED

## 2018-11-01 MED ORDER — ARIPIPRAZOLE 2 MG PO TABS
2.0000 mg | ORAL_TABLET | Freq: Every day | ORAL | Status: DC
  Administered 2018-11-01 – 2018-11-03 (×3): 2 mg via ORAL
  Filled 2018-11-01 (×4): qty 1

## 2018-11-01 MED ORDER — ESCITALOPRAM OXALATE 10 MG PO TABS
5.0000 mg | ORAL_TABLET | Freq: Every day | ORAL | Status: DC
  Administered 2018-11-01 – 2018-11-04 (×4): 5 mg via ORAL
  Filled 2018-11-01 (×4): qty 0.5

## 2018-11-01 MED ORDER — SODIUM CHLORIDE 0.9 % IV SOLN
3.0000 g | Freq: Two times a day (BID) | INTRAVENOUS | Status: DC
  Administered 2018-11-01 – 2018-11-03 (×5): 3 g via INTRAVENOUS
  Filled 2018-11-01 (×7): qty 3

## 2018-11-01 NOTE — Progress Notes (Signed)
Foot is stable. Minimal drainage to dressing. C/W every other day dry bulky padded dressing. OK for d/c from podiatry standpoint. F/u outpt with Dr. Elvina Mattes in 1-2 weeks.

## 2018-11-01 NOTE — Progress Notes (Signed)
Pharmacy Antibiotic Note  Yesenia Shepard is a 68 y.o. female admitted on 10/24/2018 with osteomyelitis.  Pharmacy has been consulted for vancomycin dosing. Patient is also on pip/tazo  Today, 11/01/2018  Random vancomycin level = 39 4/21 at 10:24 - dose given by time lab resulted and pharmacy called RN.  Her last vanco dose was 4/21 at 11:44am on a dose of 750mg  IV q12h. Dose was changed - no doses given since 4/21 am  Bone cx = E. Faecalis with susc pending  Discussed rec for medication changes with vascular surgery (recommendations accepted)  Discussed AKI and cx results with Dr Delaine Lame and will change to amp/sulbactam to avoid nephrotoxicity of current regimen  Plan: Current CrCl: ~15 ml/min    Will order Amp/sulbactam 3 g IV 12h.  Continue to follow renal function closely and adjust accordingly  Height: 5\' 5"  (165.1 cm) Weight: 147 lb 11.3 oz (67 kg) IBW/kg (Calculated) : 57  Temp (24hrs), Avg:98.3 F (36.8 C), Min:98 F (36.7 C), Max:98.5 F (36.9 C)  Recent Labs  Lab 10/28/18 1024 10/29/18 0947 10/30/18 0305 10/31/18 0445 11/01/18 0544  WBC  --  8.6 9.6 11.0* 11.5*  CREATININE  --  3.20* 3.24* 3.36* 3.32*  VANCORANDOM 39 40  --   --   --     Estimated Creatinine Clearance: 14.6 mL/min (A) (by C-G formula based on SCr of 3.32 mg/dL (H)).    No Known Allergies  Antimicrobials this admission: 04/22 Unasyn>> 0417 Zosyn >> 4/22 0419 vancomycin >> 4/22   Microbiology results: 0419 Surgical/deep wound culture: E. Faecalis (susc pending) [phone] MRSA PCR: negative  Thank you for allowing pharmacy to be a part of this patient's care.  Rocky Morel, PharmD Clinical Pharmacist 11/01/2018 2:17 PM

## 2018-11-01 NOTE — Consult Note (Addendum)
[facility] Consult--Followup  Reason for Consult:  Depression  Referring Physician:  Marcelle Overlie, PA Patient Identification: Yesenia Shepard MRN:  [id number] Principal Diagnosis: Anxiety and depression Diagnosis:  Principal Problem:   Anxiety and depression Active Problems:   Atherosclerosis of artery of extremity with ulceration (HCC)   Total Time spent with patient: 25 minutes.   Subjective:    Patient reports doing fairly well today.  Her Abilify and Lexapro were not started. Had done well with these in the past. Denies having any side effects on them.  Denies any stressors today.  No racing thoughts or psychosis.  No suicidal homicidal plan intent drive or preparation.  Appetite is intact.  She slept well last night.  No new concerns.  Risk to Self:  none Risk to Others:  none Prior Inpatient Therapy:  none Prior Outpatient Therapy:  PCP  Past Medical History:  Past Medical History:  Diagnosis Date  . Anxiety   . Arthritis   . Chronic pain   . Depression   . Drug abuse ([place])    History of polysubstance abuse; currently on methadone.  . Hepatitis    History of Hep "C". treated and cured with Harvoni  . History of chicken pox   . Hypertension   . Panic attacks   . Peripheral vascular disease ([place])    stent in place.   . Pre-diabetes   . UTI (urinary tract infection)    History of    Past Surgical History:  Procedure Laterality Date  . ABDOMINAL HYSTERECTOMY  1989  . AMPUTATION TOE Left 10/26/2018   Procedure: 5TH RAY RESCTION;  Surgeon: Albertine Patricia, DPM;  Location: [facility] ORS;  Service: Podiatry;  Laterality: Left;  . CHOLECYSTECTOMY  1987  . INTRACAPSULAR CATARACT EXTRACTION Left   . LOWER EXTREMITY ANGIOGRAPHY Left 12/17/2017   Procedure: LOWER EXTREMITY ANGIOGRAPHY;  Surgeon: Katha Cabal, MD;  Location: [place] [facility];  Service: Cardiovascular;  Laterality: Left;  . LOWER EXTREMITY ANGIOGRAPHY Left 10/24/2018   Procedure:  LOWER EXTREMITY ANGIOGRAPHY;  Surgeon: Algernon Huxley, MD;  Location: [place] [facility];  Service: Cardiovascular;  Laterality: Left;  . PERIPHERAL VASCULAR CATHETERIZATION Left 05/04/2015   Procedure: Lower Extremity Angiography;  Surgeon: Katha Cabal, MD;  Location: [place] [facility];  Service: Cardiovascular;  Laterality: Left;  . PERIPHERAL VASCULAR CATHETERIZATION Left 05/04/2015   Procedure: Lower Extremity Intervention;  Surgeon: Katha Cabal, MD;  Location: [place] [facility];  Service: Cardiovascular;  Laterality: Left;  . SHOULDER ARTHROSCOPY WITH OPEN ROTATOR CUFF REPAIR Right 01/16/2017   Procedure: SHOULDER ARTHROSCOPY WITH OPEN ROTATOR CUFF REPAIR;  Surgeon: Thornton Park, MD;  Location: [facility] ORS;  Service: Orthopedics;  Laterality: Right;  . TONSILLECTOMY AND ADENOIDECTOMY  1971   Family History:  Family History  Problem Relation Age of Onset  . Arthritis Mother   . Mental illness Mother        depression  . Diabetes Mother   . Cancer Mother        pancreatic  . Cancer Father        colon  . Heart disease Father   . Diabetes Father   . Cancer Daughter        lung   Family Psychiatric  History: mother with depression Social History:  Social History   Substance and Sexual Activity  Alcohol Use No  . Alcohol/week: 0.0 standard drinks     Social History   Substance and Sexual Activity  Drug Use No   Comment: Hx of.    Social History   Socioeconomic History  . Marital status: Divorced    Spouse name: Not on file  . Number of children: Not on file  . Years of education: Not on file  . Highest education level: Not on file  Occupational History  . Not on file  Social Needs  . Financial resource strain: Not hard at all  . Food insecurity:    Worry: Never true    Inability: Never true  . Transportation needs:    Medical: No    Non-medical: No  Tobacco Use  . Smoking status: Former Smoker    Packs/day: 0.50    Years: 30.00    Pack  years: 15.00    Types: Cigarettes    Last attempt to quit: 07/25/2018    Years since quitting: 0.2  . Smokeless tobacco: Never Used  . Tobacco comment: Currently smoking 1/2 ppd  Substance and Sexual Activity  . Alcohol use: No    Alcohol/week: 0.0 standard drinks  . Drug use: No    Comment: Hx of.  . Sexual activity: Not Currently  Lifestyle  . Physical activity:    Days per week: 0 days    Minutes per session: Not on file  . Stress: Not on file  Relationships  . Social connections:    Talks on phone: Not on file    Gets together: Not on file    Attends religious service: Not on file    Active member of club or organization: Not on file    Attends meetings of clubs or organizations: Not on file    Relationship status: Not on file  Other Topics Concern  . Not on file  Social History Narrative   1 daughter died in 10-07-2016    Lives at home not alone    Additional Social History:    Allergies:  No Known Allergies  Labs:  Results for orders placed or performed during the hospital encounter of 10/24/18 (from the past 48 hour(s))  CBC     Status: Abnormal   Collection Time: 10/31/18  4:45 AM  Result Value Ref Range   WBC 11.0 (H) 4.0 - 10.5 K/uL   RBC 3.54 (L) 3.87 - 5.11 MIL/uL   Hemoglobin 9.8 (L) 12.0 - 15.0 g/dL   HCT 32.1 (L) 36.0 - 46.0 %   MCV 90.7 80.0 - 100.0 fL   MCH 27.7 26.0 - 34.0 pg   MCHC 30.5 30.0 - 36.0 g/dL   RDW 13.2 11.5 - 15.5 %   Platelets 313 150 - 400 K/uL   nRBC 0.0 0.0 - 0.2 %    Comment: Performed at [facility], [address] [place], [place] [address]  Basic metabolic panel     Status: Abnormal   Collection Time: 10/31/18  4:45 AM  Result Value Ref Range   Sodium 141 135 - 145 mmol/L   Potassium 3.5 3.5 - 5.1 mmol/L   Chloride 104 98 - 111 mmol/L   CO2 28 22 - 32 mmol/L   Glucose, Bld 74 70 - 99 mg/dL   BUN 28 (H) 8 - 23 mg/dL   Creatinine, Ser 3.36 (H) 0.44 - 1.00 mg/dL   Calcium 8.3 (L) 8.9 - 10.3 mg/dL   GFR calc non Af  Amer 13 (L) >60 mL/min   GFR calc Af Amer 15 (L) >60 mL/min   Anion gap 9 5 - 15    Comment:  Performed at [facility], [address] [place], [place] [address]  Magnesium     Status: Abnormal   Collection Time: 10/31/18  4:45 AM  Result Value Ref Range   Magnesium 2.6 (H) 1.7 - 2.4 mg/dL    Comment: Performed at [facility], [address] [place], [place] [address]  Novel Coronavirus, NAA (hospital order; send-out to ref lab)     Status: None   Collection Time: 10/31/18 11:35 AM  Result Value Ref Range   SARS-CoV-2, NAA NOT DETECTED NOT DETECTED    Comment: (NOTE) This test was developed and its performance characteristics determined by [employer]. This test has not been FDA cleared or approved. This test has been authorized by FDA under an Emergency Use Authorization (EUA). This test has been validated in accordance with the FDA's Guidance Document (Policy for [place] in Laboratories Certified to Perform High Complexity Testing under CLIA prior to Emergency Use Authorization for Coronavirus [id number] during the [facility] Emergency) issued on February 29th, 2020. FDA independent review of this validation is pending. This test is only authorized for the duration of time the declaration that circumstances exist justifying the authorization of the emergency use of in vitro diagnostic tests for detection of SARS-CoV- 2 virus and/or diagnosis of COVID-19 infection under section 564(b)(1) of the Act, 21 U.S.C. 778EUM-3(N)(3), unless the authorization is terminated or revoked sooner. Performed At: [facility] [address] [place], [place] [id number] Rush Farmer MD GQ:[id number]    Coronavirus Source NASOPHARYNGEAL     Comment: Performed at [facility], [address] [place], [place] [address]  CBC     Status: Abnormal   Collection Time: 11/01/18  5:44 AM  Result Value Ref Range   WBC 11.5 (H) 4.0 - 10.5  K/uL   RBC 3.59 (L) 3.87 - 5.11 MIL/uL   Hemoglobin 10.0 (L) 12.0 - 15.0 g/dL   HCT 32.5 (L) 36.0 - 46.0 %   MCV 90.5 80.0 - 100.0 fL   MCH 27.9 26.0 - 34.0 pg   MCHC 30.8 30.0 - 36.0 g/dL   RDW 13.1 11.5 - 15.5 %   Platelets 319 150 - 400 K/uL   nRBC 0.0 0.0 - 0.2 %    Comment: Performed at [facility], [address] [place], [place] [address]  Basic metabolic panel     Status: Abnormal   Collection Time: 11/01/18  5:44 AM  Result Value Ref Range   Sodium 139 135 - 145 mmol/L   Potassium 3.0 (L) 3.5 - 5.1 mmol/L   Chloride 104 98 - 111 mmol/L   CO2 25 22 - 32 mmol/L   Glucose, Bld 82 70 - 99 mg/dL   BUN 32 (H) 8 - 23 mg/dL   Creatinine, Ser 3.32 (H) 0.44 - 1.00 mg/dL   Calcium 8.2 (L) 8.9 - 10.3 mg/dL   GFR calc non Af Amer 14 (L) >60 mL/min   GFR calc Af Amer 16 (L) >60 mL/min   Anion gap 10 5 - 15    Comment: Performed at [facility], [address] [place], [place] [address]  Magnesium     Status: Abnormal   Collection Time: 11/01/18  5:44 AM  Result Value Ref Range   Magnesium 2.6 (H) 1.7 - 2.4 mg/dL    Comment: Performed at [facility], [address] [place], [place] [address]    Current Facility-Administered Medications  Medication Dose Route Frequency Provider Last Rate Last  Dose  . 0.9 %  sodium chloride infusion   Intravenous PRN Algernon Huxley, MD   Stopped at 10/27/18 1259  . acetaminophen (TYLENOL) tablet 650 mg  650 mg Oral Q6H PRN Algernon Huxley, MD       Or  . acetaminophen (TYLENOL) suppository 650 mg  650 mg Rectal Q6H PRN Algernon Huxley, MD      . Ampicillin-Sulbactam (UNASYN) 3 g in sodium chloride 0.9 % 100 mL IVPB  3 g Intravenous Q24H Rito Ehrlich A, RPH   Stopped at 11/01/18 0950  . aspirin EC tablet 81 mg  81 mg Oral Daily Algernon Huxley, MD   81 mg at 11/01/18 0920  . atorvastatin (LIPITOR) tablet 40 mg  40 mg Oral Daily Algernon Huxley, MD   40 mg at 11/01/18 0920  . clonazePAM (KLONOPIN) tablet 1 mg  1 mg Oral BID  Stegmayer, Kimberly A, PA-C   1 mg at 11/01/18 0920  . clopidogrel (PLAVIX) tablet 75 mg  75 mg Oral Daily Algernon Huxley, MD   75 mg at 11/01/18 7829  . docusate sodium (COLACE) capsule 200 mg  200 mg Oral BID Algernon Huxley, MD   200 mg at 11/01/18 5621  . feeding supplement (GLUCERNA SHAKE) (GLUCERNA SHAKE) liquid 237 mL  237 mL Oral BID BM Candiss Norse, Har[place], MD   237 mL at 11/01/18 0919  . heparin injection 5,000 Units  5,000 Units Subcutaneous Q8H Algernon Huxley, MD   5,000 Units at 11/01/18 0536  . MEDLINE mouth rinse  15 mL Mouth Rinse BID Mansy, Myrian A, MD   15 mL at 11/01/18 3086  . methadone (DOLOPHINE) 10 MG/ML solution 80 mg  80 mg Oral Daily Stegmayer, Kimberly A, PA-C   80 mg at 11/01/18 1026  . ondansetron (ZOFRAN) tablet 4 mg  4 mg Oral Q6H PRN Algernon Huxley, MD       Or  . ondansetron (ZOFRAN) injection 4 mg  4 mg Intravenous Q6H PRN Algernon Huxley, MD      . polyethylene glycol (MIRALAX / GLYCOLAX) packet 17 g  17 g Oral Daily PRN Algernon Huxley, MD      . sorbitol 70 % solution 30 mL  30 mL Oral Daily PRN Algernon Huxley, MD      . tamsulosin (FLOMAX) capsule 0.4 mg  0.4 mg Oral QPC supper Stegmayer, Kimberly A, PA-C   0.4 mg at 10/31/18 1730  . traMADol (ULTRAM) tablet 50 mg  50 mg Oral Q6H PRN Stegmayer, Kimberly A, PA-C   50 mg at 10/29/18 2034   Facility-Administered Medications Ordered in Other Encounters  Medication Dose Route Frequency Provider Last Rate Last Dose  . ceFAZolin (ANCEF) IVPB 2g/100 mL premix  2 g Intravenous Once Kris Hartmann, NP        Musculoskeletal: Strength & Muscle Tone: decreased Gait & Station: did not witness Patient leans: N/A  Psychiatric Specialty Exam: neural  Review of Systems  Constitutional: Negative.   HENT: Negative.   Eyes: Negative.   Cardiovascular: Negative.   Gastrointestinal: Negative.   Genitourinary: Negative.   Musculoskeletal:       Left foot discomfort related to surgery  Skin: Negative.   Neurological: Negative.    Endo/Heme/Allergies: Negative.     Blood pressure (!) 125/56, pulse 76, temperature 98.4 F (36.9 C), temperature source Oral, resp. rate 16, height 5\' 5"  (1.651 m), weight 67 kg, SpO2 92 %.Body mass index is  24.58 kg/m.  General Appearance: Casual  Eye Contact:  Good  Speech:  Normal Rate  Volume:  Decreased  Mood: Okay  Affect:  Congruent  Thought Process:  Coherent and Descriptions of Associations: Intact  Orientation:  Full (Time, Place, and Person)  Thought Content:  WDL and Logical  Suicidal Thoughts:  No  Homicidal Thoughts:  No  Memory:  Immediate;   Good Recent;   Good Remote;   Good  Judgement:  Fair to good  Insight:  Good  Psychomotor Activity:  Decreased  Concentration:  Concentration: Good and Attention Span: Good  Recall:  Good  Fund of Knowledge:  Good  Language:  Good  Akathisia:  No  Handed:  Right  AIMS (if indicated):     Assets:  Housing Leisure Time Resilience Social Support  ADL's:  Impaired  Cognition:  WNL  Sleep:        Treatment Plan Summary: Depression: -Recommend starting Lexapro 5 mg daily as she reports this worked for her in the past with Abilify -Recommend starting Abilify 2 mg daily  Anxiety: -Continue Klonopin 1 mg BID with goal of decreasing this medication as the Lexapro and Abilify become more effective  4/25 No changes today Discussed with nurse today  Follow up with outpatient provider after discharge.  Not recommended for inpatient psychiatry at this time.    Disposition: No evidence of imminent risk to self or others at present.   Patient does not meet criteria for psychiatric inpatient admission. Supportive therapy provided about ongoing stressors.  Rulon Sera, MD 11/01/2018 11:55 AM

## 2018-11-01 NOTE — Progress Notes (Signed)
6 Days Post-Op Subjective/Chief Complaint:  Patient is comfortable and denies any pain.  She is getting up and about.     Objective: Vital signs in last 24 hours: Temp:  [98 F (36.7 C)-98.5 F (36.9 C)] 98.4 F (36.9 C) (04/25 0326) Pulse Rate:  [62-76] 76 (04/25 0326) Resp:  [16-18] 16 (04/25 0326) BP: (120-125)/(54-56) 125/56 (04/25 0326) SpO2:  [92 %-96 %] 92 % (04/25 0326) Weight:  [54 kg] 67 kg (04/25 0326) Last BM Date: 10/31/18  Intake/Output from previous day: 04/24 0701 - 04/25 0700 In: 360 [P.O.:360] Out: 500 [Urine:500] Intake/Output this shift: Total I/O In: 220 [P.O.:120; IV Piggyback:100] Out: -   General appearance: alert, cooperative and no distress Lung and heart clear.  Abdomen clear, Lower extremities.  The left foot dressing is intact, the right foot unremarkable.    Lab Results:  Recent Labs    10/31/18 0445 11/01/18 0544  WBC 11.0* 11.5*  HGB 9.8* 10.0*  HCT 32.1* 32.5*  PLT 313 319   BMET Recent Labs    10/31/18 0445 11/01/18 0544  NA 141 139  K 3.5 3.0*  CL 104 104  CO2 28 25  GLUCOSE 74 82  BUN 28* 32*  CREATININE 3.36* 3.32*  CALCIUM 8.3* 8.2*   PT/INR No results for input(s): LABPROT, INR in the last 72 hours. ABG No results for input(s): PHART, HCO3 in the last 72 hours.  Invalid input(s): PCO2, PO2  Studies/Results: No results found.  Anti-infectives: Anti-infectives (From admission, onward)   Start     Dose/Rate Route Frequency Ordered Stop   11/01/18 0929  Ampicillin-Sulbactam (UNASYN) 3 g in sodium chloride 0.9 % 100 mL IVPB     3 g 200 mL/hr over 30 Minutes Intravenous Every 24 hours 10/31/18 1458     10/29/18 2000  Ampicillin-Sulbactam (UNASYN) 3 g in sodium chloride 0.9 % 100 mL IVPB  Status:  Discontinued     3 g 200 mL/hr over 30 Minutes Intravenous Every 12 hours 10/29/18 1216 10/31/18 1458   10/29/18 1200  vancomycin (VANCOCIN) 1,250 mg in sodium chloride 0.9 % 250 mL IVPB  Status:  Discontinued     1,250 mg 166.7 mL/hr over 90 Minutes Intravenous Every 24 hours 10/28/18 1400 10/29/18 1035   10/29/18 1033  vancomycin variable dose per unstable renal function (pharmacist dosing)  Status:  Discontinued      Does not apply See admin instructions 10/29/18 1035 10/29/18 1148   10/27/18 0000  vancomycin (VANCOCIN) IVPB 750 mg/150 ml premix  Status:  Discontinued     750 mg 150 mL/hr over 60 Minutes Intravenous Every 12 hours 10/26/18 1400 10/28/18 1400   10/26/18 1030  vancomycin (VANCOCIN) 2,000 mg in sodium chloride 0.9 % 500 mL IVPB     2,000 mg 250 mL/hr over 120 Minutes Intravenous  Once 10/26/18 1017 10/26/18 1402   10/26/18 1015  vancomycin (VANCOCIN) 2,000 mg in sodium chloride 0.9 % 500 mL IVPB  Status:  Discontinued     2,000 mg 250 mL/hr over 120 Minutes Intravenous  Once 10/26/18 1014 10/26/18 1016   10/24/18 1800  piperacillin-tazobactam (ZOSYN) IVPB 3.375 g  Status:  Discontinued     3.375 g 12.5 mL/hr over 240 Minutes Intravenous Every 8 hours 10/24/18 1503 10/29/18 1148   10/24/18 1600  cephALEXin (KEFLEX) capsule 500 mg  Status:  Discontinued     500 mg Oral 3 times daily 10/24/18 1503 10/24/18 1618   10/24/18 1300  ceFAZolin (ANCEF) IVPB 2g/100 mL  premix  Status:  Discontinued     2 g 200 mL/hr over 30 Minutes Intravenous  Once 10/24/18 1252 10/24/18 1503   10/24/18 1237  ceFAZolin (ANCEF) 2-4 GM/100ML-% IVPB    Note to Pharmacy:  Despina Arias  : cabinet override      10/24/18 1237 10/24/18 1350      Assessment/Plan: s/p Procedure(s): 5TH RAY RESCTION (Left) Plan for discharge tomorrow  PT/OT  LOS: 8 days    Elmore Guise 11/01/2018

## 2018-11-01 NOTE — Progress Notes (Signed)
Physical Therapy Treatment Patient Details Name: Yesenia Shepard MRN: [id number] DOB: [birth year] Today's Date: 11/01/2018    History of Present Illness 68 y.o.female with infected ulcerations on the left foot; s/p L 5th ray resection 2/2 to athelosclerosis of LE with ulceration. Patient is PNWB (50%) on LLE while wearing orthowedge shoe, and OOB for necessities only. Patient has PMH: HTN, DM, HLD, osteoporosis, Hep C.     PT Comments    Pt agreeable to PT; denies pain. Pt communicates grave fear of falling. Provided understanding and encouragement. Pt demonstrates slow processing and some difficulty following 1 step commands requiring increased verbal and tactile cues with repetitive instruction throughout. Pt demonstrates decreased use of RLE (non surgical side). Increased effort/cues for use of RLE with functional mobility. STS transfers require 2+ and assist to maintain RLE on floor and in place once on floor. Pt unable to demonstrates steps today; after 2 stands reports urgent need to use the [facility]. Stand pivot transfer to [facility]. Pt requiring increased time to have a bowel movement, so nursing called to stay with pt. Nursing will call if requiring additional help to place pt up in chair. Continue PT to progress strength, endurance, balance and safety to improve all functional mobility.   Follow Up Recommendations  SNF     Equipment Recommendations  Other (comment);Wheelchair (measurements PT)    Recommendations for Other Services       Precautions / Restrictions Precautions Precautions: Fall Restrictions Weight Bearing Restrictions: Yes LLE Weight Bearing: Partial weight bearing LLE Partial Weight Bearing Percentage or Pounds: 50%    Mobility  Bed Mobility Overal bed mobility: Needs Assistance Bed Mobility: Supine to Sit Rolling: Min guard;Min assist(cueing)         General bed mobility comments: sequencing cues and Min A for LEs over edge of bed and to maintain RLE on the  floor  Transfers Overall transfer level: Needs assistance Equipment used: Rolling walker (2 wheeled)(also plus 2 stand/stand pivot under each arm) Transfers: Sit to/from [facility] Sit to Stand: From elevated surface;Max assist;Mod assist;+2 physical assistance Stand pivot transfers: +2 physical assistance;Max assist       General transfer comment: 3 stands. Pt very afraid of falling. Difficulty maintaining R foot on the floor initally requiring increased assist. Poor ability to come to full upright stance without increased cues/assist.   Ambulation/Gait             General Gait Details: attempted; ultimately unable with need for stand pivot bed to [facility]   Stairs             Wheelchair Mobility    Modified Rankin (Stroke Patients Only)       Balance Overall balance assessment: Needs assistance Sitting-balance support: Feet supported Sitting balance-Leahy Scale: Fair     Standing balance support: Bilateral upper extremity supported Standing balance-Leahy Scale: Poor                              Cognition Arousal/Alertness: Awake/alert Behavior During Therapy: Flat affect Overall Cognitive Status: Within Functional Limits for tasks assessed                                 General Comments: Slow responses; requires multiple level of cueing and repetitive instruction      Exercises General Exercises - Lower Extremity Ankle Circles/Pumps: AROM;Both;10 reps Quad Sets: Strengthening;Both;10 reps  Gluteal Sets: Strengthening;Both;10 reps Long Arc Quad: AROM;Both;10 reps Hip Flexion/Marching: AROM;Both;10 reps Other Exercises Other Exercises: toileting    General Comments        Pertinent Vitals/Pain Pain Assessment: No/denies pain    Home Living                      Prior Function            PT Goals (current goals can now be found in the care plan section) Progress towards PT goals: Progressing  toward goals(slowly)    Frequency    7X/week      PT Plan Current plan remains appropriate    Co-evaluation              AM-PAC PT "6 Clicks" Mobility   Outcome Measure  Help needed turning from your back to your side while in a flat bed without using bedrails?: A Lot Help needed moving from lying on your back to sitting on the side of a flat bed without using bedrails?: A Lot Help needed moving to and from a bed to a chair (including a wheelchair)?: A Lot Help needed standing up from a chair using your arms (e.g., wheelchair or bedside chair)?: A Lot Help needed to walk in hospital room?: Total Help needed climbing 3-5 steps with a railing? : Total 6 Click Score: 10    End of Session Equipment Utilized During Treatment: Gait belt Activity Tolerance: Patient limited by fatigue;Other (comment)(weakness; slow processing; fear) Patient left: Other (comment)(with nursing on [facility])   PT Visit Diagnosis: Muscle weakness (generalized) (M62.81);Difficulty in walking, not elsewhere classified (R26.2);History of falling (Z91.81)     Time: 9678-9381 PT Time Calculation (min) (ACUTE ONLY): 29 min  Charges:  $Therapeutic Exercise: 8-22 mins $Therapeutic Activity: 8-22 mins                      Larae Grooms, PTA 11/01/2018, 2:30 PM

## 2018-11-01 NOTE — Plan of Care (Signed)
  Problem: Health Behavior/Discharge Planning: Goal: Ability to manage health-related needs will improve Outcome: Progressing   Problem: Clinical Measurements: Goal: Ability to maintain clinical measurements within normal limits will improve Outcome: Progressing Goal: Will remain free from infection Outcome: Progressing Goal: Diagnostic test results will improve Outcome: Progressing   Problem: Activity: Goal: Risk for activity intolerance will decrease Outcome: Progressing   Problem: Nutrition: Goal: Adequate nutrition will be maintained Outcome: Progressing   Problem: Coping: Goal: Level of anxiety will decrease Outcome: Progressing   Problem: Pain Managment: Goal: General experience of comfort will improve Outcome: Progressing   Problem: Skin Integrity: Goal: Risk for impaired skin integrity will decrease Outcome: Progressing

## 2018-11-01 NOTE — Progress Notes (Signed)
[place], [place] 11/01/18  Subjective:   Patient is doing about the same.  She states she is awaiting working with physical therapist today.  Oral intake may be increasing a little bit as patient is trying Glucerna shakes.  She denies any shortness of breath  Objective:  Vital signs in last 24 hours:  Temp:  [98 F (36.7 C)-98.5 F (36.9 C)] 98.4 F (36.9 C) (04/25 0326) Pulse Rate:  [62-76] 76 (04/25 0326) Resp:  [16-18] 16 (04/25 0326) BP: (120-125)/(54-56) 125/56 (04/25 0326) SpO2:  [92 %-96 %] 92 % (04/25 0326) Weight:  [67 kg] 67 kg (04/25 0326)  Weight change: -0.3 kg Filed Weights   10/30/18 0458 10/31/18 0512 11/01/18 0326  Weight: 66.1 kg 67.3 kg 67 kg    Intake/Output:    Intake/Output Summary (Last 24 hours) at 11/01/2018 1226 Last data filed at 11/01/2018 1059 Gross per 24 hour  Intake 460 ml  Output 500 ml  Net -40 ml     Physical Exam: General:  No acute distress, sitting up in the chair in the room  HEENT  anicteric, moist oral mucous membranes  Neck  supple  Pulm/lungs Room air, normal breathing effort, clear  CVS/Heart  regular rhythm  Abdomen:   Soft, nontender  Extremities:  Left foot in special boot and bandage  Neurologic:  Alert, able to answer questions appropriately  Skin:  No acute rashes    Basic Metabolic Panel:  Recent Labs  Lab 10/29/18 0947 10/30/18 0305 10/31/18 0445 11/01/18 0544  NA 141 140 141 139  K 4.1 3.4* 3.5 3.0*  CL 103 101 104 104  CO2 29 29 28 25   GLUCOSE 129* 85 74 82  BUN 23 25* 28* 32*  CREATININE 3.20* 3.24* 3.36* 3.32*  CALCIUM 8.1* 8.1* 8.3* 8.2*  MG  --  2.5* 2.6* 2.6*     CBC: Recent Labs  Lab 10/29/18 0947 10/30/18 0305 10/31/18 0445 11/01/18 0544  WBC 8.6 9.6 11.0* 11.5*  HGB 10.2* 9.8* 9.8* 10.0*  HCT 33.7* 32.2* 32.1* 32.5*  MCV 92.6 92.5 90.7 90.5  PLT 303 295 313 319      Lab Results  Component Value Date   HEPBSAG NEGATIVE 10/18/2015   HEPBSAB  NEG 10/18/2015      Microbiology:  Recent Results (from the past 240 hour(s))  Surgical pcr screen     Status: None   Collection Time: 10/25/18  3:49 AM  Result Value Ref Range Status   MRSA, PCR NEGATIVE NEGATIVE Final   Staphylococcus aureus NEGATIVE NEGATIVE Final    Comment: (NOTE) The Xpert SA Assay (FDA approved for NASAL specimens in patients 88 years of age and older), is one component of a comprehensive surveillance program. It is not intended to diagnose infection nor to guide or monitor treatment. Performed at [facility], [address] [place], [place] [address]   Aerobic/Anaerobic Culture (surgical/deep wound)     Status: None (Preliminary result)   Collection Time: 10/26/18  8:29 AM  Result Value Ref Range Status   Specimen Description   Final    BONE 5TH METATARSAL Performed at [facility], [address], [place], [place] [address]    Special Requests   Final    NONE Performed at [facility], [place], [place] [address]    Gram Stain   Final    MODERATE WBC PRESENT, PREDOMINANTLY PMN FEW GRAM POSITIVE COCCI    Culture  Final    MODERATE GRAM POSITIVE RODS MODERATE ENTEROCOCCUS FAECALIS SUSCEPTIBILITIES PERFORMED ON PREVIOUS CULTURE WITHIN THE LAST 5 DAYS. HOLDING FOR POSSIBLE ANAEROBE Performed at [facility], [place] [address] [place], [place] [address]    Report Status PENDING  Incomplete  Aerobic/Anaerobic Culture (surgical/deep wound)     Status: None (Preliminary result)   Collection Time: 10/26/18  8:33 AM  Result Value Ref Range Status   Specimen Description   Final    BONE 5TH METATARSAL Performed at [facility], [place] [address], [place], [place] [address]    Special Requests   Final    NONE Performed at [facility], [address] [place], [place] [address]    Gram Stain   Final    ABUNDANT WBC PRESENT, PREDOMINANTLY PMN FEW GRAM POSITIVE COCCI    Culture   Final     MODERATE GRAM POSITIVE RODS MODERATE ENTEROCOCCUS FAECALIS HOLDING FOR POSSIBLE ANAEROBE Performed at [facility], [place] [address], [place], [place] [address]    Report Status PENDING  Incomplete   Organism ID, Bacteria ENTEROCOCCUS FAECALIS  Final      Susceptibility   Enterococcus faecalis - MIC*    AMPICILLIN <=2 SENSITIVE Sensitive     VANCOMYCIN 1 SENSITIVE Sensitive     GENTAMICIN SYNERGY SENSITIVE Sensitive     * MODERATE ENTEROCOCCUS FAECALIS  Novel Coronavirus, NAA (hospital order; send-out to ref lab)     Status: None   Collection Time: 10/31/18 11:35 AM  Result Value Ref Range Status   SARS-CoV-2, NAA NOT DETECTED NOT DETECTED Final    Comment: (NOTE) This test was developed and its performance characteristics determined by [employer]. This test has not been FDA cleared or approved. This test has been authorized by FDA under an Emergency Use Authorization (EUA). This test has been validated in accordance with the FDA's Guidance Document (Policy for [place] in Laboratories Certified to Perform High Complexity Testing under CLIA prior to Emergency Use Authorization for Coronavirus [id number] during the [facility] Emergency) issued on February 29th, 2020. FDA independent review of this validation is pending. This test is only authorized for the duration of time the declaration that circumstances exist justifying the authorization of the emergency use of in vitro diagnostic tests for detection of SARS-CoV- 2 virus and/or diagnosis of COVID-19 infection under section 564(b)(1) of the Act, 21 U.S.C. 284XLK-4(M)(0), unless the authorization is terminated or revoked sooner. Performed At: [facility] [place], [place] [id number] Rush Farmer MD [id number]    [place]  Final    Comment: Performed at [facility], [address] [place], [place] [address]    Coagulation  Studies: No results for input(s): LABPROT, INR in the last 72 hours.  Urinalysis: No results for input(s): COLORURINE, LABSPEC, PHURINE, GLUCOSEU, HGBUR, BILIRUBINUR, KETONESUR, PROTEINUR, UROBILINOGEN, NITRITE, LEUKOCYTESUR in the last 72 hours.  Invalid input(s): APPERANCEUR    Imaging: No results found.   Medications:   . sodium chloride Stopped (10/27/18 1259)  . ampicillin-sulbactam (UNASYN) IV Stopped (11/01/18 0950)   . ARIPiprazole  2 mg Oral QHS  . aspirin EC  81 mg Oral Daily  . atorvastatin  40 mg Oral Daily  . clonazePAM  1 mg Oral BID  . clopidogrel  75 mg Oral Daily  . docusate sodium  200 mg Oral BID  . escitalopram  5 mg Oral Daily  . feeding supplement (GLUCERNA SHAKE)  237 mL Oral  BID BM  . heparin  5,000 Units Subcutaneous Q8H  . mouth rinse  15 mL Mouth Rinse BID  . methadone  80 mg Oral Daily  . tamsulosin  0.4 mg Oral QPC supper   sodium chloride, acetaminophen **OR** acetaminophen, ondansetron **OR** ondansetron (ZOFRAN) IV, polyethylene glycol, sorbitol, traMADol  Assessment/ Plan:  68 y.o. Caucasian female with medical problems of peripheral vascular disease, hypertension, chronic left foot ulcer, depression, polysubstance abuse, history of hep C treated, who was admitted to [facility] on 10/24/2018 for evaluation of left foot wound and underwent angiogram, PTA of left leg and then subsequently underwent left fifth ray amputation and I&D of plantar abscess on 10/26/2018   1.  Acute kidney injury Likely secondary to ATN secondary to IV contrast exposure as well as vancomycin toxicity Baseline creatinine is 0.73 from October 24, 2018.  Today's creatinine remains elevated at 3.32. No significant change compared to last 2 days.  This may mean that serum creatinine is stabilizing. -Patient is able to take adequate oral intake.  Avoid IV fluids or diuretics unless absolutely necessary Electrolytes and volume status are acceptable.  No acute indication for dialysis  at present -Now that offending agents have been stopped, renal function is expected to improve slowly  2.  Hypokalemia -Agree with potassium replacement as needed  We will continue to follow   LOS: [place] 4/25/202012:26 PM  [place], [place]  Note: This note was prepared with Dragon dictation. Any transcription errors are unintentional

## 2018-11-02 LAB — POTASSIUM
Potassium: 3 mmol/L — ABNORMAL LOW (ref 3.5–5.1)
Potassium: 3.3 mmol/L — ABNORMAL LOW (ref 3.5–5.1)

## 2018-11-02 LAB — BASIC METABOLIC PANEL
Anion gap: 12 (ref 5–15)
BUN: 36 mg/dL — ABNORMAL HIGH (ref 8–23)
CO2: 25 mmol/L (ref 22–32)
Calcium: 8 mg/dL — ABNORMAL LOW (ref 8.9–10.3)
Chloride: 101 mmol/L (ref 98–111)
Creatinine, Ser: 3.18 mg/dL — ABNORMAL HIGH (ref 0.44–1.00)
GFR calc Af Amer: 17 mL/min — ABNORMAL LOW (ref >60)
GFR calc non Af Amer: 14 mL/min — ABNORMAL LOW (ref >60)
Glucose, Bld: 97 mg/dL (ref 70–99)
Potassium: 2.7 mmol/L — CL (ref 3.5–5.1)
Sodium: 138 mmol/L (ref 135–145)

## 2018-11-02 LAB — CBC
HCT: 31.8 % — ABNORMAL LOW (ref 36.0–46.0)
Hemoglobin: 10 g/dL — ABNORMAL LOW (ref 12.0–15.0)
MCH: 27.9 pg (ref 26.0–34.0)
MCHC: 31.4 g/dL (ref 30.0–36.0)
MCV: 88.8 fL (ref 80.0–100.0)
Platelets: 330 10*3/uL (ref 150–400)
RBC: 3.58 MIL/uL — ABNORMAL LOW (ref 3.87–5.11)
RDW: 13.2 % (ref 11.5–15.5)
WBC: 11.7 10*3/uL — ABNORMAL HIGH (ref 4.0–10.5)
nRBC: 0 % (ref 0.0–0.2)

## 2018-11-02 LAB — MAGNESIUM: Magnesium: 2.6 mg/dL — ABNORMAL HIGH (ref 1.7–2.4)

## 2018-11-02 MED ORDER — POTASSIUM CHLORIDE CRYS ER 20 MEQ PO TBCR
40.0000 meq | EXTENDED_RELEASE_TABLET | Freq: Once | ORAL | Status: AC
  Administered 2018-11-02: 13:00:00 40 meq via ORAL
  Filled 2018-11-02: qty 4

## 2018-11-02 MED ORDER — AMILORIDE HCL 5 MG PO TABS
5.0000 mg | ORAL_TABLET | Freq: Every day | ORAL | Status: AC
  Administered 2018-11-02 – 2018-11-03 (×2): 5 mg via ORAL
  Filled 2018-11-02 (×2): qty 1

## 2018-11-02 MED ORDER — POTASSIUM CHLORIDE CRYS ER 20 MEQ PO TBCR
40.0000 meq | EXTENDED_RELEASE_TABLET | Freq: Once | ORAL | Status: AC
  Administered 2018-11-02: 06:00:00 40 meq via ORAL
  Filled 2018-11-02: qty 4

## 2018-11-02 NOTE — Progress Notes (Signed)
PT Cancellation Note  Patient Details Name: Yesenia Shepard MRN: [id number] DOB: 1950-10-03   Cancelled Treatment:    Reason Eval/Treat Not Completed: Other (comment) Patient in bed and consuming breakfast on PT arrival. Patient requested opportunity to finish breakfast prior to PT participation. PT will follow-up at a later time/date. Chart reviewed.   Myles Gip PT, DPT [phone] 11/02/2018, 9:06 AM

## 2018-11-02 NOTE — Progress Notes (Signed)
7 Days Post-Op   Subjective/Chief Complaint: Patient is doing well overall, except for a low K level, which she got 40 meq of KDUR.  She has been up and about and still has her foley catheter in.     Objective: Vital signs in last 24 hours: Temp:  [98.3 F (36.8 C)-99.6 F (37.6 C)] 99.6 F (37.6 C) (04/26 0403) Pulse Rate:  [67-74] 74 (04/26 0403) Resp:  [16-19] 18 (04/26 0403) BP: (121-137)/(51-56) 137/51 (04/26 0403) SpO2:  [91 %-100 %] 94 % (04/26 0403) Last BM Date: 10/31/18  Intake/Output from previous day: 04/25 0701 - 04/26 0700 In: 340 [P.O.:240; IV Piggyback:100] Out: 800 [Urine:800] Intake/Output this shift: Total I/O In: 0  Out: 300 [Urine:300] Patient is alert and oriented x3 and not in acute distress.  Lungs and heart are clear.  Abdomen is soft and nontender nondistended.  Femoral pulses are 1+ bilaterally.  Left lower extremity foot dressing is intact patient is able to wiggle her toes.  The right foot is unremarkable except for previous surgeries.  The rest of her exam is unremarkable.   Lab Results:  Recent Labs    11/01/18 0544 11/02/18 0450  WBC 11.5* 11.7*  HGB 10.0* 10.0*  HCT 32.5* 31.8*  PLT 319 330   BMET Recent Labs    11/01/18 0544 11/02/18 0450 11/02/18 0819  NA 139 138  --   K 3.0* 2.7* 3.0*  CL 104 101  --   CO2 25 25  --   GLUCOSE 82 97  --   BUN 32* 36*  --   CREATININE 3.32* 3.18*  --   CALCIUM 8.2* 8.0*  --    PT/INR No results for input(s): LABPROT, INR in the last 72 hours. ABG No results for input(s): PHART, HCO3 in the last 72 hours.  Invalid input(s): PCO2, PO2  Studies/Results: No results found.  Anti-infectives: Anti-infectives (From admission, onward)   Start     Dose/Rate Route Frequency Ordered Stop   11/01/18 2200  Ampicillin-Sulbactam (UNASYN) 3 g in sodium chloride 0.9 % 100 mL IVPB     3 g 200 mL/hr over 30 Minutes Intravenous Every 12 hours 11/01/18 1416     11/01/18 0929  Ampicillin-Sulbactam  (UNASYN) 3 g in sodium chloride 0.9 % 100 mL IVPB  Status:  Discontinued     3 g 200 mL/hr over 30 Minutes Intravenous Every 24 hours 10/31/18 1458 11/01/18 1416   10/29/18 2000  Ampicillin-Sulbactam (UNASYN) 3 g in sodium chloride 0.9 % 100 mL IVPB  Status:  Discontinued     3 g 200 mL/hr over 30 Minutes Intravenous Every 12 hours 10/29/18 1216 10/31/18 1458   10/29/18 1200  vancomycin (VANCOCIN) 1,250 mg in sodium chloride 0.9 % 250 mL IVPB  Status:  Discontinued     1,250 mg 166.7 mL/hr over 90 Minutes Intravenous Every 24 hours 10/28/18 1400 10/29/18 1035   10/29/18 1033  vancomycin variable dose per unstable renal function (pharmacist dosing)  Status:  Discontinued      Does not apply See admin instructions 10/29/18 1035 10/29/18 1148   10/27/18 0000  vancomycin (VANCOCIN) IVPB 750 mg/150 ml premix  Status:  Discontinued     750 mg 150 mL/hr over 60 Minutes Intravenous Every 12 hours 10/26/18 1400 10/28/18 1400   10/26/18 1030  vancomycin (VANCOCIN) 2,000 mg in sodium chloride 0.9 % 500 mL IVPB     2,000 mg 250 mL/hr over 120 Minutes Intravenous  Once 10/26/18 1017 10/26/18 1402  10/26/18 1015  vancomycin (VANCOCIN) 2,000 mg in sodium chloride 0.9 % 500 mL IVPB  Status:  Discontinued     2,000 mg 250 mL/hr over 120 Minutes Intravenous  Once 10/26/18 1014 10/26/18 1016   10/24/18 1800  piperacillin-tazobactam (ZOSYN) IVPB 3.375 g  Status:  Discontinued     3.375 g 12.5 mL/hr over 240 Minutes Intravenous Every 8 hours 10/24/18 1503 10/29/18 1148   10/24/18 1600  cephALEXin (KEFLEX) capsule 500 mg  Status:  Discontinued     500 mg Oral 3 times daily 10/24/18 1503 10/24/18 1618   10/24/18 1300  ceFAZolin (ANCEF) IVPB 2g/100 mL premix  Status:  Discontinued     2 g 200 mL/hr over 30 Minutes Intravenous  Once 10/24/18 1252 10/24/18 1503   10/24/18 1237  ceFAZolin (ANCEF) 2-4 GM/100ML-% IVPB    Note to Pharmacy:  Despina Arias  : cabinet override      10/24/18 1237 10/24/18 1350       Assessment/Plan: s/p Procedure(s): 5TH RAY RESCTION (Left) I will continue to replace her potassium which was 3.0 with p.o. K-Dur 40 mEq.  We will remove the Foley catheter and continue with PT OT  LOS: 9 days    Elmore Guise 11/02/2018

## 2018-11-02 NOTE — Progress Notes (Signed)
[place], [place] 11/02/18  Subjective:   Patient is doing about the same.  She states she is awaiting working with physical therapist today.  Oral intake may be increasing a little bit as patient is trying Glucerna shakes.  She denies any shortness of breath Slight improvement in serum creatinine today is noted at 3.18.  Potassium still low at 3.0  Objective:  Vital signs in last 24 hours:  Temp:  [98.3 F (36.8 C)-99.6 F (37.6 C)] 99.6 F (37.6 C) (04/26 0403) Pulse Rate:  [67-74] 74 (04/26 0403) Resp:  [16-19] 18 (04/26 0403) BP: (121-137)/(51-56) 137/51 (04/26 0403) SpO2:  [91 %-100 %] 94 % (04/26 0403)  Weight change:  Filed Weights   10/30/18 0458 10/31/18 0512 11/01/18 0326  Weight: 66.1 kg 67.3 kg 67 kg    Intake/Output:    Intake/Output Summary (Last 24 hours) at 11/02/2018 1405 Last data filed at 11/02/2018 1102 Gross per 24 hour  Intake 120 ml  Output 1100 ml  Net -980 ml     Physical Exam: General:  No acute distress, sitting up in the bed  HEENT  anicteric, moist oral mucous membranes  Neck  supple  Pulm/lungs  Room air, normal breathing effort, clear  CVS/Heart  regular rhythm  Abdomen:   Soft, nontender  Extremities:  Left foot in special bandage  Neurologic:  Alert, able to answer questions appropriately  Skin:  No acute rashes    Basic Metabolic Panel:  Recent Labs  Lab 10/29/18 0947 10/30/18 0305 10/31/18 0445 11/01/18 0544 11/02/18 0450 11/02/18 0819  NA 141 140 141 139 138  --   K 4.1 3.4* 3.5 3.0* 2.7* 3.0*  CL 103 101 104 104 101  --   CO2 29 29 28 25 25   --   GLUCOSE 129* 85 74 82 97  --   BUN 23 25* 28* 32* 36*  --   CREATININE 3.20* 3.24* 3.36* 3.32* 3.18*  --   CALCIUM 8.1* 8.1* 8.3* 8.2* 8.0*  --   MG  --  2.5* 2.6* 2.6* 2.6*  --      CBC: Recent Labs  Lab 10/29/18 0947 10/30/18 0305 10/31/18 0445 11/01/18 0544 11/02/18 0450  WBC 8.6 9.6 11.0* 11.5* 11.7*  HGB 10.2* 9.8* 9.8* 10.0*  10.0*  HCT 33.7* 32.2* 32.1* 32.5* 31.8*  MCV 92.6 92.5 90.7 90.5 88.8  PLT 303 295 313 319 330      Lab Results  Component Value Date   HEPBSAG NEGATIVE 10/18/2015   HEPBSAB NEG 10/18/2015      Microbiology:  Recent Results (from the past 240 hour(s))  Surgical pcr screen     Status: None   Collection Time: 10/25/18  3:49 AM  Result Value Ref Range Status   MRSA, PCR NEGATIVE NEGATIVE Final   Staphylococcus aureus NEGATIVE NEGATIVE Final    Comment: (NOTE) The Xpert SA Assay (FDA approved for NASAL specimens in patients 50 years of age and older), is one component of a comprehensive surveillance program. It is not intended to diagnose infection nor to guide or monitor treatment. Performed at [facility], [address] [place], [place] [address]   Aerobic/Anaerobic Culture (surgical/deep wound)     Status: None   Collection Time: 10/26/18  8:29 AM  Result Value Ref Range Status   Specimen Description   Final    BONE 5TH METATARSAL Performed at [facility], [address] [place], [place] [address]    Special  Requests   Final    NONE Performed at [facility], [address] [place], [place] [address]    Gram Stain   Final    MODERATE WBC PRESENT, PREDOMINANTLY PMN FEW GRAM POSITIVE COCCI    Culture   Final    MODERATE CORYNEBACTERIUM SPECIES MODERATE ENTEROCOCCUS FAECALIS MODERATE PEPTOSTREPTOCOCCUS MICROS MODERATE PREVOTELLA SPECIES BETA LACTAMASE POSITIVE Performed at [facility], [place] [address] [place], [place] [address]    Report Status 11/01/2018 FINAL  Final   Organism ID, Bacteria ENTEROCOCCUS FAECALIS  Final      Susceptibility   Enterococcus faecalis - MIC*    AMPICILLIN <=2 SENSITIVE Sensitive     VANCOMYCIN 1 SENSITIVE Sensitive     GENTAMICIN SYNERGY SENSITIVE Sensitive     * MODERATE ENTEROCOCCUS FAECALIS  Aerobic/Anaerobic Culture (surgical/deep wound)     Status: None   Collection Time: 10/26/18   8:33 AM  Result Value Ref Range Status   Specimen Description   Final    BONE 5TH METATARSAL Performed at [facility], [place] [address] [place], [place] [address]    Special Requests   Final    NONE Performed at [facility], [address] [place], [place] [address]    Gram Stain   Final    ABUNDANT WBC PRESENT, PREDOMINANTLY PMN FEW GRAM POSITIVE COCCI    Culture   Final    MODERATE CORYNEBACTERIUM SPECIES MODERATE ENTEROCOCCUS FAECALIS MODERATE PEPTOSTREPTOCOCCUS MICROS MODERATE PREVOTELLA SPECIES BETA LACTAMASE POSITIVE Performed at [facility], [place] [address], [place], [place] [address]    Report Status 11/01/2018 FINAL  Final   Organism ID, Bacteria ENTEROCOCCUS FAECALIS  Final      Susceptibility   Enterococcus faecalis - MIC*    AMPICILLIN <=2 SENSITIVE Sensitive     VANCOMYCIN 1 SENSITIVE Sensitive     GENTAMICIN SYNERGY SENSITIVE Sensitive     * MODERATE ENTEROCOCCUS FAECALIS  Novel Coronavirus, NAA (hospital order; send-out to ref lab)     Status: None   Collection Time: 10/31/18 11:35 AM  Result Value Ref Range Status   SARS-CoV-2, NAA NOT DETECTED NOT DETECTED Final    Comment: (NOTE) This test was developed and its performance characteristics determined by [employer]. This test has not been FDA cleared or approved. This test has been authorized by FDA under an Emergency Use Authorization (EUA). This test has been validated in accordance with the FDA's Guidance Document (Policy for [place] in Laboratories Certified to Perform High Complexity Testing under CLIA prior to Emergency Use Authorization for Coronavirus [id number] during the [facility] Emergency) issued on February 29th, 2020. FDA independent review of this validation is pending. This test is only authorized for the duration of time the declaration that circumstances exist justifying the authorization of the emergency use of in vitro diagnostic  tests for detection of SARS-CoV- 2 virus and/or diagnosis of COVID-19 infection under section 564(b)(1) of the Act, 21 U.S.C. 629BMW-4(X)(3), unless the authorization is terminated or revoked sooner. Performed At: [facility] [place], [place] [id number] Rush Farmer MD ZD:[id number]    [place]  Final    Comment: Performed at [facility], [address] [place], [place] [address]    Coagulation Studies: No results for input(s): LABPROT, INR in the last 72 hours.  Urinalysis: No results for input(s): COLORURINE, LABSPEC, PHURINE, GLUCOSEU, HGBUR, BILIRUBINUR, KETONESUR, PROTEINUR, UROBILINOGEN, NITRITE, LEUKOCYTESUR in the last 72 hours.  Invalid input(s):  APPERANCEUR    Imaging: No results found.   Medications:   . sodium chloride Stopped (10/27/18 1259)  . ampicillin-sulbactam (UNASYN) IV 3 g (11/02/18 1244)   . ARIPiprazole  2 mg Oral QHS  . aspirin EC  81 mg Oral Daily  . atorvastatin  40 mg Oral Daily  . clonazePAM  1 mg Oral BID  . clopidogrel  75 mg Oral Daily  . docusate sodium  200 mg Oral BID  . escitalopram  5 mg Oral Daily  . feeding supplement (GLUCERNA SHAKE)  237 mL Oral BID BM  . heparin  5,000 Units Subcutaneous Q8H  . mouth rinse  15 mL Mouth Rinse BID  . methadone  80 mg Oral Daily  . tamsulosin  0.4 mg Oral QPC supper   sodium chloride, acetaminophen **OR** acetaminophen, ondansetron **OR** ondansetron (ZOFRAN) IV, polyethylene glycol, sorbitol, traMADol  Assessment/ Plan:  68 y.o. Caucasian female with medical problems of peripheral vascular disease, hypertension, chronic left foot ulcer, depression, polysubstance abuse, history of hep C treated, who was admitted to [facility] on 10/24/2018 for evaluation of left foot wound and underwent angiogram, PTA of left leg and then subsequently underwent left fifth ray amputation and I&D of plantar abscess on 10/26/2018   1.  Acute kidney  injury Likely secondary to ATN secondary to IV contrast exposure as well as vancomycin toxicity Baseline creatinine is 0.73 from October 24, 2018.  Today's creatinine remains elevated at 3.32->3.18. Slight improvement noted   -Patient is able to take adequate oral intake.  Avoid IV fluids or diuretics unless absolutely necessary Electrolytes and volume status are acceptable.  No acute indication for dialysis at present -Now that offending agents have been stopped, renal function is expected to improve slowly  2.  Hypokalemia -Agree with potassium replacement as needed -We will give couple doses of amiloride  We will continue to follow   LOS: [place] 4/26/20202:05 PM  [place], [place]  Note: This note was prepared with Dragon dictation. Any transcription errors are unintentional

## 2018-11-02 NOTE — Progress Notes (Signed)
Patient's potassium level 2.7, received verbal order from Dr. Feliberto Gottron for 64meq of potassium one time and recheck levels.

## 2018-11-02 NOTE — Progress Notes (Signed)
PT Cancellation Note  Patient Details Name: Yesenia Shepard MRN: [id number] DOB: April 02, 1951   Cancelled Treatment:    Reason Eval/Treat Not Completed: Other (comment) Chart reviewed. Patient in bed on PT arrival and amenable to therapy. As PT was setting up room, patient informed PT that she was mid-BM when PT entered room. PT notified NT and will attempt session at a later time/date.  Myles Gip PT, DPT [phone] 11/02/2018, 11:32 AM

## 2018-11-02 NOTE — Progress Notes (Signed)
Physical Therapy Treatment Patient Details Name: Yesenia Shepard MRN: [id number] DOB: [birth year] Today's Date: 11/02/2018    History of Present Illness 68 y.o.female with infected ulcerations on the left foot; s/p L 5th ray resection 2/2 to athelosclerosis of LE with ulceration. Patient is PNWB (50%) on LLE while wearing orthowedge shoe, and OOB for necessities only. Patient has PMH: HTN, DM, HLD, osteoporosis, Hep C.     PT Comments    Patient in bed and amenable to therapy on PT arrival. Patient continues to demonstrate significant limitations in mobility and activity tolerance and decreased retention of task sequencing and frequent need for cueing. Patient worked on sitting balance and EOB mobility, but fatigues quickly and requires frequent rest breaks as well as max encouragement to prevent frustration. Patient will continue to benefit from skilled therapeutic intervention to address deficits in balance, mobility, strength, and safety for improved surgical outcomes and return to PLOF.  Patient in bed with alarm set and call bell in reach; all needs met.     Follow Up Recommendations  SNF     Equipment Recommendations  Other (comment);Wheelchair (measurements PT)    Recommendations for Other Services       Precautions / Restrictions Precautions Precautions: Fall Required Braces or Orthoses: Other Brace Other Brace: Orthowedge shoe Restrictions Weight Bearing Restrictions: Yes LLE Weight Bearing: Partial weight bearing LLE Partial Weight Bearing Percentage or Pounds: 50%    Mobility  Bed Mobility Overal bed mobility: Needs Assistance Bed Mobility: Supine to Sit;Sit to Supine Rolling: Min guard;Min assist(cueing)         General bed mobility comments: Frequent sequencing cues and redirection to task  Transfers Overall transfer level: Needs assistance Equipment used: Rolling walker (2 wheeled)(also plus 2 stand/stand pivot under each arm) Transfers: Sit to/from  Stand;Stand Pivot Transfers Sit to Stand: From elevated surface;Max assist            Ambulation/Gait                 Stairs             Wheelchair Mobility    Modified Rankin (Stroke Patients Only)       Balance Overall balance assessment: Needs assistance Sitting-balance support: Feet supported Sitting balance-Leahy Scale: Fair                                      Cognition Arousal/Alertness: Awake/alert Behavior During Therapy: Flat affect Overall Cognitive Status: Within Functional Limits for tasks assessed                                 General Comments: Slow responses; requires multiple level of cueing and repetitive instruction      Exercises Other Exercises Other Exercises: Bed mobility: Min A for sit to supine; CGA for supine to sit from flat bed with use of bed rails Other Exercises: Sitting balance EOB: reaching x10, weight/shifting scooting x20, seated push-ups x10 Other Exercises: Sit to Stand transfer x3 attempts with none successfully achieving erect posture; Patient requires Mod/Max A and does not demonstrate good safety with use of RW. Patient requires frequent cueing for sequencing and safety and does not demonstrate good retention of safe DME use.     General Comments        Pertinent Vitals/Pain Pain Assessment: No/denies pain    Home  Living                      Prior Function            PT Goals (current goals can now be found in the care plan section) Acute Rehab PT Goals Patient Stated Goal: go home PT Goal Formulation: With patient Time For Goal Achievement: 11/11/18 Potential to Achieve Goals: Fair Progress towards PT goals: Progressing toward goals    Frequency    7X/week      PT Plan Current plan remains appropriate    Co-evaluation              AM-PAC PT "6 Clicks" Mobility   Outcome Measure  Help needed turning from your back to your side while in a flat  bed without using bedrails?: A Lot Help needed moving from lying on your back to sitting on the side of a flat bed without using bedrails?: A Lot Help needed moving to and from a bed to a chair (including a wheelchair)?: A Lot Help needed standing up from a chair using your arms (e.g., wheelchair or bedside chair)?: A Lot Help needed to walk in hospital room?: Total Help needed climbing 3-5 steps with a railing? : Total 6 Click Score: 10    End of Session Equipment Utilized During Treatment: Gait belt Activity Tolerance: Patient limited by fatigue(weakness; slow processing; fear) Patient left: in bed;with call bell/phone within reach;with bed alarm set(with nursing on [facility])   PT Visit Diagnosis: Muscle weakness (generalized) (M62.81);Difficulty in walking, not elsewhere classified (R26.2);History of falling (Z91.81)     Time: 1325-1410 PT Time Calculation (min) (ACUTE ONLY): 45 min  Charges:  $Therapeutic Activity: 38-52 mins                    Myles Gip PT, DPT [phone] 11/02/2018, 3:26 PM

## 2018-11-02 NOTE — Consult Note (Signed)
[facility] Consult--Followup  Reason for Consult:  Depression  Referring Physician:  Marcelle Overlie, PA Patient Identification: Yesenia Shepard MRN:  [id number] Principal Diagnosis: Anxiety and depression Diagnosis:  Principal Problem:   Anxiety and depression Active Problems:   Atherosclerosis of artery of extremity with ulceration (HCC)   Total Time spent with patient: 26 minutes.   Subjective:    Yesenia Shepard continues to report feeling emotionally stable.  Tells me that she is ready discharge soon, is feeling a little antsy being in the hospital.  She denies overt anxiety, racing thoughts, or mood swings.  She is tolerated her Abilify and Lexapro well.  No suicidal or homicidal plans.  Appetite is fair.  Sleep is intact.  Voices no new concerns.  Risk to Self:  none Risk to Others:  none Prior Inpatient Therapy:  none Prior Outpatient Therapy:  PCP  Past Medical History:  Past Medical History:  Diagnosis Date  . Anxiety   . Arthritis   . Chronic pain   . Depression   . Drug abuse ([place])    History of polysubstance abuse; currently on methadone.  . Hepatitis    History of Hep "C". treated and cured with Harvoni  . History of chicken pox   . Hypertension   . Panic attacks   . Peripheral vascular disease ([place])    stent in place.   . Pre-diabetes   . UTI (urinary tract infection)    History of    Past Surgical History:  Procedure Laterality Date  . ABDOMINAL HYSTERECTOMY  1989  . AMPUTATION TOE Left 10/26/2018   Procedure: 5TH RAY RESCTION;  Surgeon: Albertine Patricia, DPM;  Location: [facility] ORS;  Service: Podiatry;  Laterality: Left;  . CHOLECYSTECTOMY  1987  . INTRACAPSULAR CATARACT EXTRACTION Left   . LOWER EXTREMITY ANGIOGRAPHY Left 12/17/2017   Procedure: LOWER EXTREMITY ANGIOGRAPHY;  Surgeon: Katha Cabal, MD;  Location: [place] [facility];  Service: Cardiovascular;  Laterality: Left;  . LOWER EXTREMITY ANGIOGRAPHY Left 10/24/2018   Procedure:  LOWER EXTREMITY ANGIOGRAPHY;  Surgeon: Algernon Huxley, MD;  Location: [place] [facility];  Service: Cardiovascular;  Laterality: Left;  . PERIPHERAL VASCULAR CATHETERIZATION Left 05/04/2015   Procedure: Lower Extremity Angiography;  Surgeon: Katha Cabal, MD;  Location: [place] [facility];  Service: Cardiovascular;  Laterality: Left;  . PERIPHERAL VASCULAR CATHETERIZATION Left 05/04/2015   Procedure: Lower Extremity Intervention;  Surgeon: Katha Cabal, MD;  Location: [place] [facility];  Service: Cardiovascular;  Laterality: Left;  . SHOULDER ARTHROSCOPY WITH OPEN ROTATOR CUFF REPAIR Right 01/16/2017   Procedure: SHOULDER ARTHROSCOPY WITH OPEN ROTATOR CUFF REPAIR;  Surgeon: Thornton Park, MD;  Location: [facility];  Service: Orthopedics;  Laterality: Right;  . TONSILLECTOMY AND ADENOIDECTOMY  1971   Family History:  Family History  Problem Relation Age of Onset  . Arthritis Mother   . Mental illness Mother        depression  . Diabetes Mother   . Cancer Mother        pancreatic  . Cancer Father        colon  . Heart disease Father   . Diabetes Father   . Cancer Daughter        lung   Family Psychiatric  History: mother with depression Social History:  Social History   Substance and Sexual Activity  Alcohol Use No  . Alcohol/week: 0.0 standard drinks     Social History   Substance and Sexual  Activity  Drug Use No   Comment: Hx of.    Social History   Socioeconomic History  . Marital status: Divorced    Spouse name: Not on file  . Number of children: Not on file  . Years of education: Not on file  . Highest education level: Not on file  Occupational History  . Not on file  Social Needs  . Financial resource strain: Not hard at all  . Food insecurity:    Worry: Never true    Inability: Never true  . Transportation needs:    Medical: No    Non-medical: No  Tobacco Use  . Smoking status: Former Smoker    Packs/day: 0.50    Years: 30.00    Pack  years: 15.00    Types: Cigarettes    Last attempt to quit: 07/25/2018    Years since quitting: 0.2  . Smokeless tobacco: Never Used  . Tobacco comment: Currently smoking 1/2 ppd  Substance and Sexual Activity  . Alcohol use: No    Alcohol/week: 0.0 standard drinks  . Drug use: No    Comment: Hx of.  . Sexual activity: Not Currently  Lifestyle  . Physical activity:    Days per week: 0 days    Minutes per session: Not on file  . Stress: Not on file  Relationships  . Social connections:    Talks on phone: Not on file    Gets together: Not on file    Attends religious service: Not on file    Active member of club or organization: Not on file    Attends meetings of clubs or organizations: Not on file    Relationship status: Not on file  Other Topics Concern  . Not on file  Social History Narrative   1 daughter died in 10/02/2016    Lives at home not alone    Additional Social History:    Allergies:  No Known Allergies  Labs:  Results for orders placed or performed during the hospital encounter of 10/24/18 (from the past 48 hour(s))  Novel Coronavirus, NAA (hospital order; send-out to ref lab)     Status: None   Collection Time: 10/31/18 11:35 AM  Result Value Ref Range   SARS-CoV-2, NAA NOT DETECTED NOT DETECTED    Comment: (NOTE) This test was developed and its performance characteristics determined by [employer]. This test has not been FDA cleared or approved. This test has been authorized by FDA under an Emergency Use Authorization (EUA). This test has been validated in accordance with the FDA's Guidance Document (Policy for [facility] [place] in Laboratories Certified to Perform High Complexity Testing under CLIA prior to Emergency Use Authorization for Coronavirus [id number] during the [facility]) issued on February 29th, 2020. FDA independent review of this validation is pending. This test is only authorized for the duration of time the  declaration that circumstances exist justifying the authorization of the emergency use of in vitro diagnostic tests for detection of SARS-CoV- 2 virus and/or diagnosis of COVID-19 infection under section 564(b)(1) of the Act, 21 U.S.C. 941DEY-8(X)(4), unless the authorization is terminated or revoked sooner. Performed At: [facility] [place], [place] [id number] Rush Farmer MD HF:[id number]    Coronavirus Source NASOPHARYNGEAL     Comment: Performed at [facility], [address] [place], [place] [address]  CBC     Status: Abnormal   Collection Time: 11/01/18  5:44 AM  Result Value Ref Range  WBC 11.5 (H) 4.0 - 10.5 K/uL   RBC 3.59 (L) 3.87 - 5.11 MIL/uL   Hemoglobin 10.0 (L) 12.0 - 15.0 g/dL   HCT 32.5 (L) 36.0 - 46.0 %   MCV 90.5 80.0 - 100.0 fL   MCH 27.9 26.0 - 34.0 pg   MCHC 30.8 30.0 - 36.0 g/dL   RDW 13.1 11.5 - 15.5 %   Platelets 319 150 - 400 K/uL   nRBC 0.0 0.0 - 0.2 %    Comment: Performed at [facility], [address] [place], [place] [address]  Basic metabolic panel     Status: Abnormal   Collection Time: 11/01/18  5:44 AM  Result Value Ref Range   Sodium 139 135 - 145 mmol/L   Potassium 3.0 (L) 3.5 - 5.1 mmol/L   Chloride 104 98 - 111 mmol/L   CO2 25 22 - 32 mmol/L   Glucose, Bld 82 70 - 99 mg/dL   BUN 32 (H) 8 - 23 mg/dL   Creatinine, Ser 3.32 (H) 0.44 - 1.00 mg/dL   Calcium 8.2 (L) 8.9 - 10.3 mg/dL   GFR calc non Af Amer 14 (L) >60 mL/min   GFR calc Af Amer 16 (L) >60 mL/min   Anion gap 10 5 - 15    Comment: Performed at [facility], [address] [place], [place] [address]  Magnesium     Status: Abnormal   Collection Time: 11/01/18  5:44 AM  Result Value Ref Range   Magnesium 2.6 (H) 1.7 - 2.4 mg/dL    Comment: Performed at [facility], [address] [place], [place] [address]  CBC     Status: Abnormal   Collection Time: 11/02/18  4:50 AM  Result Value Ref Range   WBC 11.7  (H) 4.0 - 10.5 K/uL   RBC 3.58 (L) 3.87 - 5.11 MIL/uL   Hemoglobin 10.0 (L) 12.0 - 15.0 g/dL   HCT 31.8 (L) 36.0 - 46.0 %   MCV 88.8 80.0 - 100.0 fL   MCH 27.9 26.0 - 34.0 pg   MCHC 31.4 30.0 - 36.0 g/dL   RDW 13.2 11.5 - 15.5 %   Platelets 330 150 - 400 K/uL   nRBC 0.0 0.0 - 0.2 %    Comment: Performed at [facility], [address] [place], [place] [address]  Basic metabolic panel     Status: Abnormal   Collection Time: 11/02/18  4:50 AM  Result Value Ref Range   Sodium 138 135 - 145 mmol/L   Potassium 2.7 (LL) 3.5 - 5.1 mmol/L    Comment: CRITICAL RESULT CALLED TO, READ BACK BY AND VERIFIED WITH STEPHANIE KENNEDY RN 11/02/2018 @ 0538 RDW    Chloride 101 98 - 111 mmol/L   CO2 25 22 - 32 mmol/L   Glucose, Bld 97 70 - 99 mg/dL   BUN 36 (H) 8 - 23 mg/dL   Creatinine, Ser 3.18 (H) 0.44 - 1.00 mg/dL   Calcium 8.0 (L) 8.9 - 10.3 mg/dL   GFR calc non Af Amer 14 (L) >60 mL/min   GFR calc Af Amer 17 (L) >60 mL/min   Anion gap 12 5 - 15    Comment: Performed at [facility], [address] [place], [place] [address]  Magnesium     Status: Abnormal   Collection Time: 11/02/18  4:50 AM  Result Value Ref Range   Magnesium 2.6 (H) 1.7 - 2.4 mg/dL    Comment: Performed at  [facility], [address], [place], [place] [address]  Potassium     Status: Abnormal   Collection Time: 11/02/18  8:19 AM  Result Value Ref Range   Potassium 3.0 (L) 3.5 - 5.1 mmol/L    Comment: Performed at [facility], [address] [place], [place] [address]    Current Facility-Administered Medications  Medication Dose Route Frequency Provider Last Rate Last Dose  . 0.9 %  sodium chloride infusion   Intravenous PRN Algernon Huxley, MD   Stopped at 10/27/18 1259  . acetaminophen (TYLENOL) tablet 650 mg  650 mg Oral Q6H PRN Algernon Huxley, MD   650 mg at 11/02/18 0501   Or  . acetaminophen (TYLENOL) suppository 650 mg  650 mg Rectal Q6H PRN Algernon Huxley, MD      .  Ampicillin-Sulbactam (UNASYN) 3 g in sodium chloride 0.9 % 100 mL IVPB  3 g Intravenous Q12H Rocky Morel, RPH 200 mL/hr at 11/01/18 2244 3 g at 11/01/18 2244  . ARIPiprazole (ABILIFY) tablet 2 mg  2 mg Oral QHS Rulon Sera, MD   2 mg at 11/01/18 2202  . aspirin EC tablet 81 mg  81 mg Oral Daily Algernon Huxley, MD   81 mg at 11/01/18 0920  . atorvastatin (LIPITOR) tablet 40 mg  40 mg Oral Daily Algernon Huxley, MD   40 mg at 11/01/18 0920  . clonazePAM (KLONOPIN) tablet 1 mg  1 mg Oral BID Stegmayer, Kimberly A, PA-C   1 mg at 11/01/18 2202  . clopidogrel (PLAVIX) tablet 75 mg  75 mg Oral Daily Algernon Huxley, MD   75 mg at 11/01/18 5361  . docusate sodium (COLACE) capsule 200 mg  200 mg Oral BID Algernon Huxley, MD   200 mg at 11/01/18 2202  . escitalopram (LEXAPRO) tablet 5 mg  5 mg Oral Daily Rulon Sera, MD   5 mg at 11/01/18 1316  . feeding supplement (GLUCERNA SHAKE) (GLUCERNA SHAKE) liquid 237 mL  237 mL Oral BID BM Singh, Harmeet, MD   237 mL at 11/01/18 1316  . heparin injection 5,000 Units  5,000 Units Subcutaneous Q8H Algernon Huxley, MD   5,000 Units at 11/02/18 0511  . MEDLINE mouth rinse  15 mL Mouth Rinse BID Mansy, Halston A, MD   15 mL at 11/01/18 4431  . methadone (DOLOPHINE) 10 MG/ML solution 80 mg  80 mg Oral Daily Stegmayer, Kimberly A, PA-C   80 mg at 11/01/18 1026  . ondansetron (ZOFRAN) tablet 4 mg  4 mg Oral Q6H PRN Algernon Huxley, MD       Or  . ondansetron (ZOFRAN) injection 4 mg  4 mg Intravenous Q6H PRN Algernon Huxley, MD      . polyethylene glycol (MIRALAX / GLYCOLAX) packet 17 g  17 g Oral Daily PRN Algernon Huxley, MD      . sorbitol 70 % solution 30 mL  30 mL Oral Daily PRN Algernon Huxley, MD      . tamsulosin (FLOMAX) capsule 0.4 mg  0.4 mg Oral QPC supper Stegmayer, Kimberly A, PA-C   0.4 mg at 11/01/18 1619  . traMADol (ULTRAM) tablet 50 mg  50 mg Oral Q6H PRN Stegmayer, Kimberly A, PA-C   50 mg at 10/29/18 2034   Facility-Administered Medications Ordered in Other Encounters   Medication Dose Route Frequency Provider Last Rate Last Dose  . ceFAZolin (ANCEF) IVPB 2g/100 mL premix  2  g Intravenous Once Kris Hartmann, NP        Musculoskeletal: Strength & Muscle Tone: decreased Gait & Station: did not witness Patient leans: N/A  Psychiatric Specialty Exam: neural  Review of Systems  Constitutional: Negative.   HENT: Negative.   Eyes: Negative.   Cardiovascular: Negative.   Gastrointestinal: Negative.   Genitourinary: Negative.   Musculoskeletal:       Left foot discomfort related to surgery  Skin: Negative.   Neurological: Negative.   Endo/Heme/Allergies: Negative.     Blood pressure (!) 137/51, pulse 74, temperature 99.6 F (37.6 C), temperature source Oral, resp. rate 18, height 5\' 5"  (1.651 m), weight 67 kg, SpO2 94 %.Body mass index is 24.58 kg/m.  General Appearance: Casual  Eye Contact:  Good  Speech:  Normal Rate  Volume:  Decreased  Mood: Okay  Affect:  Congruent  Thought Process:  Coherent and Descriptions of Associations: Intact  Orientation:  Full (Time, Place, and Person)  Thought Content:  WDL and Logical  Suicidal Thoughts:  No  Homicidal Thoughts:  No  Memory:  Immediate;   Good Recent;   Good Remote;   Good  Judgement: good  Insight:  Good  Psychomotor Activity:  Decreased  Concentration:  Concentration: Good and Attention Span: Good  Recall:  Good  Fund of Knowledge:  Good  Language:  Good  Akathisia:  No  Handed:  Right  AIMS (if indicated):     Assets:  Housing Leisure Time Resilience Social Support  ADL's:  Impaired  Cognition:  WNL  Sleep:        Treatment Plan Summary: Depression: -Recommend starting Lexapro 5 mg daily as she reports this worked for her in the past with Abilify -Recommend starting Abilify 2 mg daily  Anxiety: -Continue Klonopin 1 mg BID with goal of decreasing this medication as the Lexapro and Abilify become more effective  4/25 No changes today Discussed with nurse  today  4/26 No changes  Follow up with outpatient provider after discharge.  Not recommended for inpatient psychiatry at this time.    Disposition: No evidence of imminent risk to self or others at present.   Patient does not meet criteria for psychiatric inpatient admission. Supportive therapy provided about ongoing stressors.  Rulon Sera, MD 11/02/2018 9:04 AM

## 2018-11-02 NOTE — Plan of Care (Signed)
  Problem: Health Behavior/Discharge Planning: Goal: Ability to manage health-related needs will improve Outcome: Progressing   Problem: Clinical Measurements: Goal: Ability to maintain clinical measurements within normal limits will improve Outcome: Progressing Goal: Will remain free from infection Outcome: Progressing Goal: Diagnostic test results will improve Outcome: Progressing   Problem: Activity: Goal: Risk for activity intolerance will decrease Outcome: Progressing   Problem: Nutrition: Goal: Adequate nutrition will be maintained Outcome: Progressing   Problem: Coping: Goal: Level of anxiety will decrease Outcome: Progressing   Problem: Pain Managment: Goal: General experience of comfort will improve Outcome: Progressing   Problem: Skin Integrity: Goal: Risk for impaired skin integrity will decrease Outcome: Progressing

## 2018-11-03 DIAGNOSIS — Z7189 Other specified counseling: Secondary | ICD-10-CM

## 2018-11-03 DIAGNOSIS — Z515 Encounter for palliative care: Secondary | ICD-10-CM

## 2018-11-03 LAB — CBC
HCT: 30.7 % — ABNORMAL LOW (ref 36.0–46.0)
Hemoglobin: 9.6 g/dL — ABNORMAL LOW (ref 12.0–15.0)
MCH: 28.3 pg (ref 26.0–34.0)
MCHC: 31.3 g/dL (ref 30.0–36.0)
MCV: 90.6 fL (ref 80.0–100.0)
Platelets: 296 10*3/uL (ref 150–400)
RBC: 3.39 MIL/uL — ABNORMAL LOW (ref 3.87–5.11)
RDW: 13.3 % (ref 11.5–15.5)
WBC: 11.1 10*3/uL — ABNORMAL HIGH (ref 4.0–10.5)
nRBC: 0 % (ref 0.0–0.2)

## 2018-11-03 LAB — BASIC METABOLIC PANEL
Anion gap: 8 (ref 5–15)
BUN: 33 mg/dL — ABNORMAL HIGH (ref 8–23)
CO2: 26 mmol/L (ref 22–32)
Calcium: 8.2 mg/dL — ABNORMAL LOW (ref 8.9–10.3)
Chloride: 106 mmol/L (ref 98–111)
Creatinine, Ser: 3.2 mg/dL — ABNORMAL HIGH (ref 0.44–1.00)
GFR calc Af Amer: 16 mL/min — ABNORMAL LOW (ref >60)
GFR calc non Af Amer: 14 mL/min — ABNORMAL LOW (ref >60)
Glucose, Bld: 107 mg/dL — ABNORMAL HIGH (ref 70–99)
Potassium: 3.5 mmol/L (ref 3.5–5.1)
Sodium: 140 mmol/L (ref 135–145)

## 2018-11-03 LAB — MAGNESIUM: Magnesium: 2.7 mg/dL — ABNORMAL HIGH (ref 1.7–2.4)

## 2018-11-03 MED ORDER — TAB-A-VITE/IRON PO TABS
1.0000 | ORAL_TABLET | Freq: Every day | ORAL | Status: DC
  Filled 2018-11-03 (×2): qty 1

## 2018-11-03 MED ORDER — POTASSIUM CHLORIDE CRYS ER 20 MEQ PO TBCR: 40.0000 meq | EXTENDED_RELEASE_TABLET | Freq: Every day | ORAL | Status: DC

## 2018-11-03 MED ORDER — CLONAZEPAM 1 MG PO TABS: 1.0000 mg | ORAL_TABLET | Freq: Two times a day (BID) | ORAL | 0 refills | Status: DC

## 2018-11-03 MED ORDER — METHADONE HCL 10 MG/ML PO CONC: 80.0000 mg | Freq: Every day | ORAL | 0 refills | Status: DC

## 2018-11-03 MED ORDER — VITAMIN C 500 MG PO TABS
250.0000 mg | ORAL_TABLET | Freq: Two times a day (BID) | ORAL | Status: DC
  Administered 2018-11-03 – 2018-11-04 (×3): 250 mg via ORAL
  Filled 2018-11-03 (×3): qty 1

## 2018-11-03 MED ORDER — GLUCERNA SHAKE PO LIQD: 237.0000 mL | Freq: Two times a day (BID) | ORAL | 0 refills | Status: AC

## 2018-11-03 MED ORDER — POTASSIUM CHLORIDE CRYS ER 20 MEQ PO TBCR
40.0000 meq | EXTENDED_RELEASE_TABLET | Freq: Every day | ORAL | Status: DC
  Administered 2018-11-03 – 2018-11-04 (×2): 40 meq via ORAL
  Filled 2018-11-03: qty 2
  Filled 2018-11-03: qty 4

## 2018-11-03 MED ORDER — ACETAMINOPHEN 325 MG PO TABS: 650.0000 mg | ORAL_TABLET | Freq: Four times a day (QID) | ORAL | Status: AC | PRN

## 2018-11-03 MED ORDER — ASCORBIC ACID 250 MG PO TABS: 250.0000 mg | ORAL_TABLET | Freq: Two times a day (BID) | ORAL | Status: DC

## 2018-11-03 MED ORDER — ESCITALOPRAM OXALATE 5 MG PO TABS: 5.0000 mg | ORAL_TABLET | Freq: Every day | ORAL | Status: DC

## 2018-11-03 MED ORDER — ARIPIPRAZOLE 2 MG PO TABS: 2.0000 mg | ORAL_TABLET | Freq: Every day | ORAL | Status: DC

## 2018-11-03 MED ORDER — POLYETHYLENE GLYCOL 3350 17 G PO PACK: 17.0000 g | PACK | Freq: Every day | ORAL | 0 refills | Status: DC | PRN

## 2018-11-03 MED ORDER — DOCUSATE SODIUM 100 MG PO CAPS: 200.0000 mg | ORAL_CAPSULE | Freq: Every day | ORAL | 0 refills | Status: DC | PRN

## 2018-11-03 MED ORDER — TAB-A-VITE/IRON PO TABS: 1.0000 | ORAL_TABLET | Freq: Every day | ORAL | 0 refills | Status: DC

## 2018-11-03 MED ORDER — VITAMIN D 25 MCG (1000 UNIT) PO TABS: 1000.0000 [IU] | ORAL_TABLET | Freq: Every day | ORAL | Status: DC

## 2018-11-03 MED ORDER — VITAMIN D 25 MCG (1000 UNIT) PO TABS
1000.0000 [IU] | ORAL_TABLET | Freq: Every day | ORAL | Status: DC
  Administered 2018-11-04: 1000 [IU] via ORAL
  Filled 2018-11-03: qty 1

## 2018-11-03 NOTE — TOC Progression Note (Signed)
Transition of Care [facility]) - Progression Note    Patient Details  Name: Yesenia Shepard MRN: [id number] Date of Birth: 25-Aug-1950  [facility]) CM/SW Contact  Su Hilt, RN Phone Number: 11/03/2018, 3:42 PM  Clinical Narrative:     [employer] Environmental manager, Notified Dr. Lucky Cowboy that we have auth when ready to DC  Expected Discharge Plan: [place] Barriers to Discharge: Continued Medical Work up  Expected Discharge Plan and Services Expected Discharge Plan: [place]   Discharge Planning Services: CM Consult   Living arrangements for the past 2 months: Single Family Home                                       Social Determinants of Health (SDOH) Interventions    Readmission Risk Interventions No flowsheet data found.

## 2018-11-03 NOTE — Progress Notes (Signed)
PT Cancellation Note  Patient Details Name: Yesenia Shepard MRN: [id number] DOB: [birth year]   Cancelled Treatment:    Reason Eval/Treat Not Completed: Other (comment)(Patient consuming breakfast on PT arrival and stated she needed more time. PT will follow up at a later time/date. ) Chart reviewed.   Myles Gip PT, DPT [phone] 11/03/2018, 9:15 AM

## 2018-11-03 NOTE — Progress Notes (Signed)
 Vein & Vascular Surgery Daily Progress Note   Subjective: 8 Days Post-Op:   1.Ultrasound guidance for vascular accessrightfemoral artery 2.Catheter placement into left common femoral artery from right femoral approach 3.Aortogram and selectiveleftlower extremity angiogram 4.Percutaneous transluminal angioplasty ofleft tibioperoneal trunk and proximal posterior tibial artery with 4 mm diameter by 6 cm length Lutonix drug-coated angioplasty balloon 5.Percutaneous transluminal angioplasty of the left SFA and most proximal popliteal artery with 5 mm diameter by 22 cm length Lutonix drug-coated angioplasty balloon 6.Stent placement to the left SFA with a 6 mm diameter by 20 cm length life stent for greater than 50% residual stenosis after angioplasty 7.StarClose closure device rightfemoral artery  Patient without complaint. Sitting in bed comfortably eating breakfast.   Objective: Vitals:   11/02/18 0403 11/02/18 1605 11/02/18 2040 11/03/18 0325  BP:  (!) 139/58 134/63 (!) 130/54  Pulse: 74 (!) 58 63 (!) 56  Resp:  18 18 18   Temp:  98.6 F (37 C) 98.2 F (36.8 C) (!) 97.5 F (36.4 C)  TempSrc:  Oral Oral Oral  SpO2: 94% 95% 100% 95%  Weight:    67.4 kg  Height:        Intake/Output Summary (Last 24 hours) at 11/03/2018 1011 Last data filed at 11/02/2018 1641 Gross per 24 hour  Intake 428.79 ml  Output 300 ml  Net 128.79 ml   Physical Exam: A&Ox3, NAD CV: RRR Pulmonary: CTA Bilaterally Abdomen: Soft, Nontender, Nondistended, (+) Bowel Sounds Vascular:  Left Lower Extremity: Thigh soft, calf soft. Dressing intact - clean and dry.    Laboratory: CBC    Component Value Date/Time   WBC 11.1 (H) 11/03/2018 0444   HGB 9.6 (L) 11/03/2018 0444   HCT 30.7 (L) 11/03/2018 0444   PLT 296 11/03/2018 0444   BMET    Component Value Date/Time   NA 140 11/03/2018 0444    NA 139 02/04/2015 1305   K 3.5 11/03/2018 0444   CL 106 11/03/2018 0444   CO2 26 11/03/2018 0444   GLUCOSE 107 (H) 11/03/2018 0444   BUN 33 (H) 11/03/2018 0444   BUN 10 02/04/2015 1305   CREATININE 3.20 (H) 11/03/2018 0444   CREATININE 0.76 01/05/2016 1524   CALCIUM 8.2 (L) 11/03/2018 0444   GFRNONAA 14 (L) 11/03/2018 0444   GFRNONAA 83 01/05/2016 1524   GFRAA 16 (L) 11/03/2018 0444   GFRAA >89 01/05/2016 1524   Assessment/Planning: Thepatient is a 68 year old female with multiple medical issues including peripheral artery disease status post left lower extremity endovascular intervention, status postleft footfifth ray amputation/I&D abscess plantar deep spaceby podiatry 1) Patient more alert, less depressed this AM. Appreciate recommendation and support from chaplin, palliative care and psychiatry. 2) Creatinine still elevated at this time - nephrology is following 3) Continue OT / PT / Ambulation 4) Once cleared by nephrology to be discharged home - plan is still dispo to SNF. Patient continues to be in agreement  Discussed with Dr. Ellis Parents [facility] PA-C 11/03/2018 10:11 AM

## 2018-11-03 NOTE — Progress Notes (Signed)
[facility], [place] 11/03/18  Subjective:  Renal function remains about the same. Creatinine currently 3.2 with an EGFR 14. Potassium improved to 3.5 today.   Objective:  Vital signs in last 24 hours:  Temp:  [97.5 F (36.4 C)-98.6 F (37 C)] 97.5 F (36.4 C) (04/27 0325) Pulse Rate:  [56-63] 56 (04/27 0325) Resp:  [18] 18 (04/27 0325) BP: (130-139)/(54-63) 130/54 (04/27 0325) SpO2:  [95 %-100 %] 95 % (04/27 0325) Weight:  [67.4 kg] 67.4 kg (04/27 0325)  Weight change:  Filed Weights   10/31/18 0512 11/01/18 0326 11/03/18 0325  Weight: 67.3 kg 67 kg 67.4 kg    Intake/Output:    Intake/Output Summary (Last 24 hours) at 11/03/2018 1145 Last data filed at 11/02/2018 1641 Gross per 24 hour  Intake 428.79 ml  Output -  Net 428.79 ml     Physical Exam: General:  No acute distress, sitting up in the bed  HEENT  anicteric, moist oral mucous membranes  Neck  supple  Pulm/lungs  Room air, normal breathing effort, clear  CVS/Heart  regular rhythm  Abdomen:   Soft, nontender  Extremities:  Left foot in bandage  Neurologic:  Alert, able to answer questions appropriately  Skin:  No acute rashes    Basic Metabolic Panel:  Recent Labs  Lab 10/30/18 0305 10/31/18 0445 11/01/18 0544 11/02/18 0450 11/02/18 0819 11/02/18 2006 11/03/18 0444  NA 140 141 139 138  --   --  140  K 3.4* 3.5 3.0* 2.7* 3.0* 3.3* 3.5  CL 101 104 104 101  --   --  106  CO2 29 28 25 25   --   --  26  GLUCOSE 85 74 82 97  --   --  107*  BUN 25* 28* 32* 36*  --   --  33*  CREATININE 3.24* 3.36* 3.32* 3.18*  --   --  3.20*  CALCIUM 8.1* 8.3* 8.2* 8.0*  --   --  8.2*  MG 2.5* 2.6* 2.6* 2.6*  --   --  2.7*     CBC: Recent Labs  Lab 10/30/18 0305 10/31/18 0445 11/01/18 0544 11/02/18 0450 11/03/18 0444  WBC 9.6 11.0* 11.5* 11.7* 11.1*  HGB 9.8* 9.8* 10.0* 10.0* 9.6*  HCT 32.2* 32.1* 32.5* 31.8* 30.7*  MCV 92.5 90.7 90.5 88.8 90.6  PLT 295 313 319 330 296       Lab Results  Component Value Date   HEPBSAG NEGATIVE 10/18/2015   HEPBSAB NEG 10/18/2015      Microbiology:  Recent Results (from the past 240 hour(s))  Surgical pcr screen     Status: None   Collection Time: 10/25/18  3:49 AM  Result Value Ref Range Status   MRSA, PCR NEGATIVE NEGATIVE Final   Staphylococcus aureus NEGATIVE NEGATIVE Final    Comment: (NOTE) The Xpert SA Assay (FDA approved for NASAL specimens in patients 23 years of age and older), is one component of a comprehensive surveillance program. It is not intended to diagnose infection nor to guide or monitor treatment. Performed at [facility], [address] [place], [place] [address]   Aerobic/Anaerobic Culture (surgical/deep wound)     Status: None   Collection Time: 10/26/18  8:29 AM  Result Value Ref Range Status   Specimen Description   Final    BONE 5TH METATARSAL Performed at [facility], [address], [place], [place] [address]    Special Requests   Final  NONE Performed at [facility], [address] [place], [place] [address]    Gram Stain   Final    MODERATE WBC PRESENT, PREDOMINANTLY PMN FEW GRAM POSITIVE COCCI    Culture   Final    MODERATE CORYNEBACTERIUM SPECIES MODERATE ENTEROCOCCUS FAECALIS MODERATE PEPTOSTREPTOCOCCUS MICROS MODERATE PREVOTELLA SPECIES BETA LACTAMASE POSITIVE Performed at [facility], [place] [address], [place], [place] [address]    Report Status 11/01/2018 FINAL  Final   Organism ID, Bacteria ENTEROCOCCUS FAECALIS  Final      Susceptibility   Enterococcus faecalis - MIC*    AMPICILLIN <=2 SENSITIVE Sensitive     VANCOMYCIN 1 SENSITIVE Sensitive     GENTAMICIN SYNERGY SENSITIVE Sensitive     * MODERATE ENTEROCOCCUS FAECALIS  Aerobic/Anaerobic Culture (surgical/deep wound)     Status: None   Collection Time: 10/26/18  8:33 AM  Result Value Ref Range Status   Specimen Description   Final    BONE 5TH  METATARSAL Performed at [facility], [place] [address] [place], [place] [address]    Special Requests   Final    NONE Performed at [facility], [address] [place], [place] [address]    Gram Stain   Final    ABUNDANT WBC PRESENT, PREDOMINANTLY PMN FEW GRAM POSITIVE COCCI    Culture   Final    MODERATE CORYNEBACTERIUM SPECIES MODERATE ENTEROCOCCUS FAECALIS MODERATE PEPTOSTREPTOCOCCUS MICROS MODERATE PREVOTELLA SPECIES BETA LACTAMASE POSITIVE Performed at [facility], [place] [address] [place], [place] [address]    Report Status 11/01/2018 FINAL  Final   Organism ID, Bacteria ENTEROCOCCUS FAECALIS  Final      Susceptibility   Enterococcus faecalis - MIC*    AMPICILLIN <=2 SENSITIVE Sensitive     VANCOMYCIN 1 SENSITIVE Sensitive     GENTAMICIN SYNERGY SENSITIVE Sensitive     * MODERATE ENTEROCOCCUS FAECALIS  Novel Coronavirus, NAA (hospital order; send-out to ref lab)     Status: None   Collection Time: 10/31/18 11:35 AM  Result Value Ref Range Status   SARS-CoV-2, NAA NOT DETECTED NOT DETECTED Final    Comment: (NOTE) This test was developed and its performance characteristics determined by [employer]. This test has not been FDA cleared or approved. This test has been authorized by FDA under an Emergency Use Authorization (EUA). This test has been validated in accordance with the FDA's Guidance Document (Policy for [place] in Laboratories Certified to Perform High Complexity Testing under CLIA prior to Emergency Use Authorization for Coronavirus [id number] during the [facility] Emergency) issued on February 29th, 2020. FDA independent review of this validation is pending. This test is only authorized for the duration of time the declaration that circumstances exist justifying the authorization of the emergency use of in vitro diagnostic tests for detection of SARS-CoV- 2 virus and/or diagnosis of COVID-19 infection under  section 564(b)(1) of the Act, 21 U.S.C. 141CVU-1(T)(1), unless the authorization is terminated or revoked sooner. Performed At: [facility] [place], [place] [id number] Rush Farmer MD JK:[id number]    [place]  Final    Comment: Performed at [facility], [address] [place], [place] [address]    Coagulation Studies: No results for input(s): LABPROT, INR in the last 72 hours.  Urinalysis: No results for input(s): COLORURINE, LABSPEC, PHURINE, GLUCOSEU, HGBUR, BILIRUBINUR, KETONESUR, PROTEINUR, UROBILINOGEN, NITRITE, LEUKOCYTESUR in the last 72 hours.  Invalid input(s): APPERANCEUR    Imaging: No results  found.   Medications:   . sodium chloride Stopped (10/27/18 1259)  . ampicillin-sulbactam (UNASYN) IV 3 g (11/03/18 1014)   . ARIPiprazole  2 mg Oral QHS  . aspirin EC  81 mg Oral Daily  . atorvastatin  40 mg Oral Daily  . [START ON 11/04/2018] cholecalciferol  1,000 Units Oral Daily  . clonazePAM  1 mg Oral BID  . clopidogrel  75 mg Oral Daily  . docusate sodium  200 mg Oral BID  . escitalopram  5 mg Oral Daily  . feeding supplement (GLUCERNA SHAKE)  237 mL Oral BID BM  . heparin  5,000 Units Subcutaneous Q8H  . mouth rinse  15 mL Mouth Rinse BID  . methadone  80 mg Oral Daily  . multivitamins with iron  1 tablet Oral Daily  . potassium chloride  40 mEq Oral Daily  . tamsulosin  0.4 mg Oral QPC supper  . vitamin C  250 mg Oral BID   sodium chloride, acetaminophen **OR** acetaminophen, ondansetron **OR** ondansetron (ZOFRAN) IV, polyethylene glycol, sorbitol, traMADol  Assessment/ Plan:  68 y.o. Caucasian female with medical problems of peripheral vascular disease, hypertension, chronic left foot ulcer, depression, polysubstance abuse, history of hep C treated, who was admitted to [facility] on 10/24/2018 for evaluation of left foot wound and underwent angiogram, PTA of left leg and then subsequently underwent  left fifth ray amputation and I&D of plantar abscess on 10/26/2018   1.  Acute kidney injury Likely secondary to ATN secondary to IV contrast exposure as well as vancomycin toxicity Baseline creatinine is 0.73 from October 24, 2018.   -Renal function remains diminished.  Creatinine currently 3.2 with an EGFR of 14.  No urgent indication for dialysis at the moment however we will need to continue to monitor renal parameters closely.  2.  Hypokalemia -Serum potassium now up to 3.5.  Continue potassium chloride 40 mEq p.o. daily for now.    LOS: 10 Yesenia Shepard 4/27/202011:45 AM  [place], [place]  Note: This note was prepared with Dragon dictation. Any transcription errors are unintentional

## 2018-11-03 NOTE — Progress Notes (Signed)
Physical Therapy Treatment Patient Details Name: Yesenia Shepard MRN: [id number] DOB: [birth year] Today's Date: 11/03/2018    History of Present Illness 68 y.o.female with infected ulcerations on the left foot; s/p L 5th ray resection 2/2 to athelosclerosis of LE with ulceration. Patient is PNWB (50%) on LLE while wearing orthowedge shoe, and OOB for necessities only. Patient has PMH: HTN, DM, HLD, osteoporosis, Hep C.     PT Comments    Patient in bed and amenable to therapy on PT arrival. Patient reports feeling very fatigued and states hesitancy to attempt transfers at her current energy/emotional level. Patient and PT discussed benefits of performing exercises in bed; patient requested to have handouts of exercises performed so "I can do them when I am bored in here." PT provided handout with general BLE strengthening that can be performed safely in bed. Patient demonstrated acceptable form when performing BLE exercises during session; although she continues to require task reorientation if not consistently cued to the next repetition. Patient appreciative of handouts and reports moderate confidence in her ability to complete exercises independently.   Patient in bed with bed alarm set, call bell in reach, all needs met.      Follow Up Recommendations  SNF     Equipment Recommendations  Other (comment);Wheelchair (measurements PT)    Recommendations for Other Services       Precautions / Restrictions Precautions Precautions: Fall Required Braces or Orthoses: Other Brace Other Brace: Orthowedge shoe Restrictions Weight Bearing Restrictions: Yes LLE Weight Bearing: Partial weight bearing LLE Partial Weight Bearing Percentage or Pounds: 50%    Mobility  Bed Mobility                  Transfers                    Ambulation/Gait                 Stairs             Wheelchair Mobility    Modified Rankin (Stroke Patients Only)       Balance                                             Cognition Arousal/Alertness: Awake/alert Behavior During Therapy: Flat affect Overall Cognitive Status: Within Functional Limits for tasks assessed                                 General Comments: Patient continues to require frequent cueing and increased processing time.      Exercises General Exercises - Lower Extremity Ankle Circles/Pumps: Both;10 reps Quad Sets: Both;10 reps Gluteal Sets: Both;10 reps Short Arc Quad: Both;10 reps Heel Slides: Both;10 reps Hip ABduction/ADduction: Both;10 reps Straight Leg Raises: Both;10 reps    General Comments        Pertinent Vitals/Pain Pain Assessment: No/denies pain    Home Living                      Prior Function            PT Goals (current goals can now be found in the care plan section) Acute Rehab PT Goals Patient Stated Goal: go home PT Goal Formulation: With patient Time For Goal Achievement: 11/11/18 Potential to Achieve  Goals: Fair Progress towards PT goals: (Patient expressed lack of confidence in ability to become well.)    Frequency    7X/week      PT Plan Current plan remains appropriate    Co-evaluation              AM-PAC PT "6 Clicks" Mobility   Outcome Measure  Help needed turning from your back to your side while in a flat bed without using bedrails?: A Lot Help needed moving from lying on your back to sitting on the side of a flat bed without using bedrails?: A Lot Help needed moving to and from a bed to a chair (including a wheelchair)?: A Lot Help needed standing up from a chair using your arms (e.g., wheelchair or bedside chair)?: A Lot Help needed to walk in hospital room?: Total Help needed climbing 3-5 steps with a railing? : Total 6 Click Score: 10    End of Session Equipment Utilized During Treatment: Gait belt Activity Tolerance: Patient limited by fatigue(weakness; slow processing;  fear) Patient left: in bed;with call bell/phone within reach;with bed alarm set(with nursing on [facility])   PT Visit Diagnosis: Muscle weakness (generalized) (M62.81);Difficulty in walking, not elsewhere classified (R26.2);History of falling (Z91.81)     Time: 7414-2395 PT Time Calculation (min) (ACUTE ONLY): 26 min  Charges:  $Therapeutic Exercise: 23-37 mins                    Myles Gip PT, [place] #[id number] 11/03/2018, 2:47 PM

## 2018-11-03 NOTE — Progress Notes (Signed)
Daily Progress Note   Patient Name: Yesenia Shepard       Date: 11/03/2018 DOB: December 17, 1950  Age: 68 y.o. MRN#: [id number] Attending Physician: Algernon Huxley, MD Primary Care Physician: [name], [name], MD Admit Date: 10/24/2018  Reason for Consultation/Follow-up: Establishing goals of care  Subjective: Patient is resting in bed. She states she is feeling better today.   Discussed most recent conversation. She confirms decisions from last conversation, and remains unsure about code status.  She is unsure if she would ever want dialysis if it were indicated. She states it is hard to think with so many people. Per PT, she states she wants to work with therapy, but has difficulty initiating working with them.    Length of Stay: 10  Current Medications: Scheduled Meds:  . ARIPiprazole  2 mg Oral QHS  . aspirin EC  81 mg Oral Daily  . atorvastatin  40 mg Oral Daily  . [START ON 11/04/2018] cholecalciferol  1,000 Units Oral Daily  . clonazePAM  1 mg Oral BID  . clopidogrel  75 mg Oral Daily  . docusate sodium  200 mg Oral BID  . escitalopram  5 mg Oral Daily  . feeding supplement (GLUCERNA SHAKE)  237 mL Oral BID BM  . heparin  5,000 Units Subcutaneous Q8H  . mouth rinse  15 mL Mouth Rinse BID  . methadone  80 mg Oral Daily  . multivitamins with iron  1 tablet Oral Daily  . potassium chloride  40 mEq Oral Daily  . tamsulosin  0.4 mg Oral QPC supper  . vitamin C  250 mg Oral BID    Continuous Infusions: . sodium chloride Stopped (10/27/18 1259)  . ampicillin-sulbactam (UNASYN) IV 3 g (11/03/18 1014)    PRN Meds: sodium chloride, acetaminophen **OR** acetaminophen, ondansetron **OR** ondansetron (ZOFRAN) IV, polyethylene glycol, sorbitol, traMADol  Physical Exam Pulmonary:   Effort: Pulmonary effort is normal.  Neurological:     Mental Status: She is alert.             Vital Signs: BP (!) 130/54 (BP Location: Right Arm)   Pulse (!) 56   Temp (!) 97.5 F (36.4 C) (Oral)   Resp 18   Ht 5\' 5"  (1.651 m)   Wt 67.4 kg   SpO2 95%   BMI  24.73 kg/m  SpO2: SpO2: 95 % O2 Device: O2 Device: Room Air O2 Flow Rate: O2 Flow Rate (L/min): 1 L/min  Intake/output summary:   Intake/Output Summary (Last 24 hours) at 11/03/2018 1115 Last data filed at 11/02/2018 1641 Gross per 24 hour  Intake 428.79 ml  Output -  Net 428.79 ml   LBM: Last BM Date: 11/03/18 Baseline Weight: Weight: 77.1 kg Most recent weight: Weight: 67.4 kg       Palliative Assessment/Data:      Patient Active Problem List   Diagnosis Date Noted  . Atherosclerotic peripheral vascular disease with ulceration ([place]) 10/24/2018  . Atherosclerosis of artery of extremity with ulceration ([place]) 10/24/2018  . Atherosclerosis of native arteries of extremity with rest pain ([place]) 10/01/2018  . Chronic midline low back pain with bilateral sciatica 04/23/2018  . Prediabetes 04/10/2018  . Vitamin D deficiency 04/10/2018  . Swelling of limb 03/01/2018  . Atherosclerosis of native arteries of extremity with intermittent claudication ([place]) 01/03/2018  . Leg pain 12/12/2017  . S/P right rotator cuff repair 01/16/2017  . Preoperative examination 01/14/2017  . Right shoulder pain 09/06/2016  . Hepatic cirrhosis ([place]) 02/09/2016  . Chronic hepatitis C without hepatic coma ([place]) 11/09/2015  . HLD (hyperlipidemia) 08/20/2015  . Peripheral vascular disease ([place]) 08/12/2015  . Essential hypertension 08/12/2015  . DM type 2 (diabetes mellitus, type 2) ([place]) 04/19/2015  . Current smoker 03/15/2015  . History of recreational drug use 03/15/2015  . Osteoarthritis 03/15/2015  . Preventative health care 03/15/2015  . Ulcer of left lower leg, limited to breakdown of skin ([place]) 03/15/2015  . Anxiety and  depression 08/25/2012    Palliative Care Assessment & Plan    Recommendations/Plan:  Palliative at D/C.   Recommend OT cog eval.    Code Status:    Code Status Orders  (From admission, onward)         Start     Ordered   10/24/18 1504  Full code  Continuous     10/24/18 1504        Code Status History    Date Active Date Inactive Code Status Order ID Comments User Context   12/17/2017 1504 12/17/2017 2020 Full Code [id number]  Katha Cabal, MD Inpatient   01/16/2017 1840 01/17/2017 2304 Full Code [id number]  Thornton Park, MD Inpatient       Prognosis:   Unable to determine  Discharge Planning:  To Be Determined  Care plan was discussed with PT  Thank you for allowing the Palliative Medicine Team to assist in the care of this patient.   Total Time 35 min Prolonged Time Billed  no       Greater than 50%  of this time was spent counseling and coordinating care related to the above assessment and plan.  Asencion Gowda, NP  Please contact Palliative Medicine Team phone at [phone] for questions and concerns.

## 2018-11-03 NOTE — Care Management Important Message (Signed)
Important Message  Patient Details  Name: Yesenia Shepard MRN: [id number] Date of Birth: February 07, 1951   Medicare Important Message Given:  Yes    Juliann Pulse A Maelys Kinnick 11/03/2018, 11:39 AM

## 2018-11-03 NOTE — Discharge Summary (Signed)
[facility]    Discharge Summary  Patient ID:  Yesenia Shepard MRN: [id number] DOB/AGE: [birth year] 69 y.o.  Admit date: 10/24/2018 Discharge date: 11/04/2018  Admission Diagnosis: Atherosclerosis of artery of extremity with ulceration ([place]) [I70.299, L97.909]  Discharge Diagnoses:  Atherosclerosis of artery of extremity with ulceration ([place]) [I70.299, L97.909]  Secondary Diagnoses: Past Medical History:  Diagnosis Date  . Anxiety   . Arthritis   . Chronic pain   . Depression   . Drug abuse ([place])    History of polysubstance abuse; currently on methadone.  . Hepatitis    History of Hep "C". treated and cured with Harvoni  . History of chicken pox   . Hypertension   . Panic attacks   . Peripheral vascular disease ([place])    stent in place.   . Pre-diabetes   . UTI (urinary tract infection)    History of   Procedure(s): 10/24/18: 1. Ultrasound guidance for vascular access right femoral artery 2. Catheter placement into left common femoral artery from right femoral approach 3. Aortogram and selective left lower extremity angiogram 4. Percutaneous transluminal angioplasty of left tibioperoneal trunk and proximal posterior tibial artery with 4 mm diameter by 6 cm length Lutonix drug-coated angioplasty balloon 5.  Percutaneous transluminal angioplasty of the left SFA and most proximal popliteal artery with 5 mm diameter by 22 cm length Lutonix drug-coated angioplasty balloon             6.  Stent placement to the left SFA with a 6 mm diameter by 20 cm length life stent for greater than 50% residual stenosis after angioplasty 7. StarClose closure device right femoral artery  10/26/18:   1.  Fifth ray amputation left foot   2.  I&D abscess plantar deep space left foot  Discharged Condition: stable  Hospital Course:  The patient is a 68 year old female with a past medical history  of prediabetes, panic attacks, hypertension, history of hepatitis C, history of drug abuse on methadone, depression, anxiety, chronic pain issues, arthritis with known history of peripheral vascular disease with ulceration and infection noted to the left foot.  The patient presented for a left lower extremity angiogram with intervention on October 24, 2018.  She tolerated this procedure well.  The patient was taken to the operating room on October 26, 2018 by Dr. Elvina Mattes and underwent fifth ray amputation and I&D of the left foot.  She also tolerated this procedure without complication.  Infectious disease consult was placed for IV antibiotic recommendations due to osteomyelitis to the left foot.  On October 29, 2018, the patient was noted to have an increase in her creatinine from 0.78 to >3.  All nephrotoxic medications were stopped. IV ABX were changed to Unasyn as cultures were finalized. The patient was also noted to be overly lethargic and not willing to participate in ordered therapies such as physical therapy.  A 25% reduction in her methadone was made from 110mg  to 80mg .  Her Klonopin was also decreased from 2 mg twice daily to 1 mg twice daily.  Her narcotic pain medication was stopped and replaced with tramadol.  These changes seem to have a positive effect on the patient and she became more alert. On the day of discharge the patients PRN tramadol was discontinued.   The patient was noted to have very flat affect stating she was depressed.  Spiritual care, palliative care and psychiatry was consulted.  The patient was started on Lexapro and Abilify for her  anxiety and depression.  Palliative care was able to discuss the patient's DNR status which will remain full at this time.  Spiritual care provided counseling for the patient.  The patient received physical therapy services who recommended discharge to SNF.  The patient was in agreement and chose to be discharged to [place].  The patient  received occupational therapy services and underwent a cognitive evaluation. The Montreal Cognitive Assessment [facility]) was administered assessing visuospatial/executive function, naming, memory, attention, language, abstraction, delayed recall, and orientation. Pt. Scored 19/30, with norms being greater than or equal to 26/30) Delayed recall/memory was the area of greatest impairment. Pt. does present with intermittent delayed initiation for verbal responses, delayed task initiation, and requires increased time for processing. Pt. was able to accurately identify potential safety hazards, answer safety awareness /judgement questions for ADL/IADLs accurately. Pt. Could benefit from OT services for ADL training, A/E training, cognitive compensatory strategies during ADLs, and pt. education about home modification, and DME. Occupational therapy also recommended SNF upon discharge.  During her inpatient stay, the patient went into urinary retention most likely due to her being bedbound and not willing to work with physical therapy / get out of bed to at least the chair.  A Foley had to be reinserted.  The patient was started on Flomax and the Foley removed.  The patient started to urinate on her own independently and without complication. Flomax was discontinued on discharge.  Day of discharge , the patient was transitioned from Unasyn to Augmentin. Augmentin until 11/11/18.  Upon discharge, the patient was afebrile with vital stable signs. The patients renal function was trending down. She was urinating independently.  Her pain was controlled to the use of p.o. pain medication.  She was tolerating a regular diet.  The patient was ambulating with assistance.  Physical Exam: A&Ox3, NAD CV: RRR Pulm: CTA Bilaterally Abdomen: Soft, Non-tender, Non-distended, (+) Bowel Sounds Groin: Access site healing well. No drainage. No swelling Left lower extremity: Thigh soft, calf soft. Extremity warm distally to toes.  Motor / sensory intact.  Left foot amputation site:    Labs as below  Complications: None  Consults:  Physical Therapy Occupational Therapy Dietician Psychiatry Palliative Care [place] [facility] Nephrology Hospitalist  Significant Diagnostic Studies: CBC Lab Results  Component Value Date   WBC 8.1 11/04/2018   HGB 9.5 (L) 11/04/2018   HCT 30.0 (L) 11/04/2018   MCV 89.8 11/04/2018   PLT 289 11/04/2018   BMET    Component Value Date/Time   NA 139 11/04/2018 0512   NA 139 02/04/2015 1305   K 3.7 11/04/2018 0512   CL 105 11/04/2018 0512   CO2 25 11/04/2018 0512   GLUCOSE 88 11/04/2018 0512   BUN 31 (H) 11/04/2018 0512   BUN 10 02/04/2015 1305   CREATININE 2.92 (H) 11/04/2018 0512   CREATININE 0.76 01/05/2016 1524   CALCIUM 8.0 (L) 11/04/2018 0512   GFRNONAA 16 (L) 11/04/2018 0512   GFRNONAA 83 01/05/2016 1524   GFRAA 18 (L) 11/04/2018 0512   GFRAA >89 01/05/2016 1524   COAG Lab Results  Component Value Date   INR 1.06 01/10/2017   INR 1.17 10/18/2015   Disposition:  Discharge to :Skilled nursing facility  Allergies as of 11/04/2018   No Known Allergies     Medication List    STOP taking these medications   cephALEXin 500 MG capsule Commonly known as:  KEFLEX   furosemide 20 MG tablet Commonly known as:  LASIX  HYDROcodone-acetaminophen 5-325 MG tablet Commonly known as:  Norco   meloxicam 15 MG tablet Commonly known as:  MOBIC   methadone 10 MG/5ML solution Commonly known as:  DOLOPHINE Replaced by:  methadone 10 MG/ML solution     TAKE these medications   acetaminophen 325 MG tablet Commonly known as:  TYLENOL Take 2 tablets (650 mg total) by mouth every 6 (six) hours as needed for mild pain (or Fever >/= 101).   ARIPiprazole 2 MG tablet Commonly known as:  ABILIFY Take 1 tablet (2 mg total) by mouth at bedtime.   ascorbic acid 250 MG tablet Commonly known as:  VITAMIN C Take 1 tablet (250 mg total) by mouth 2 (two)  times daily.   aspirin 81 MG tablet Take 81 mg by mouth daily.   atorvastatin 40 MG tablet Commonly known as:  LIPITOR Take 1 tablet (40 mg total) by mouth daily. What changed:  when to take this   cholecalciferol 25 MCG (1000 UT) tablet Commonly known as:  VITAMIN D3 Take 1 tablet (1,000 Units total) by mouth daily.   clonazePAM 1 MG tablet Commonly known as:  KLONOPIN Take 1 tablet (1 mg total) by mouth 2 (two) times daily. What changed:    medication strength  how much to take   clopidogrel 75 MG tablet Commonly known as:  PLAVIX TAKE 1 TABLET BY MOUTH ONCE DAILY   docusate sodium 100 MG capsule Commonly known as:  COLACE Take 2 capsules (200 mg total) by mouth daily as needed for mild constipation or moderate constipation. What changed:    when to take this  reasons to take this   escitalopram 5 MG tablet Commonly known as:  LEXAPRO Take 1 tablet (5 mg total) by mouth daily.   feeding supplement (GLUCERNA SHAKE) Liqd Take 237 mLs by mouth 2 (two) times daily between meals.   lisinopril 10 MG tablet Commonly known as:  ZESTRIL Take 1 tablet (10 mg total) by mouth daily.   methadone 10 MG/ML solution Commonly known as:  DOLOPHINE Take 8 mLs (80 mg total) by mouth daily. Replaces:  methadone 10 MG/5ML solution   multivitamins with iron Tabs tablet Take 1 tablet by mouth daily.   polyethylene glycol 17 g packet Commonly known as:  MIRALAX / GLYCOLAX Take 17 g by mouth daily as needed for severe constipation. What changed:  reasons to take this   potassium chloride SA 20 MEQ tablet Commonly known as:  K-DUR Take 2 tablets (40 mEq total) by mouth daily.      Verbal and written Discharge instructions given to the patient. Wound care per Discharge AVS  Contact information for follow-up providers    Kris Hartmann, NP In 3 weeks.   Specialty:  Vascular Surgery Why:  with ABIs Contact information: [place] [address] [phone]         Albertine Patricia, DPM Follow up in 1 week(s).   Specialty:  Podiatry Why:  First post-op incision check.  Contact information: [place] [address] [phone]        [name], [name], MD Follow up in 1 week(s).   Specialty:  Internal Medicine Why:  Was inpatient for sepsis.  Contact information: [place] [address] [phone]        Tsosie Billing, MD Follow up in 2 week(s).   Specialty:  Infectious Diseases Why:  ABX follow up Contact information: [place]  [address] [phone]            Contact information for after-discharge care    [facility] .   Service:  Skilled Nursing Contact information: [place] [phone]                 Signed: Sela Hua, PA-C  11/04/2018, 9:23 AM

## 2018-11-03 NOTE — Progress Notes (Signed)
Yesenia Shepard and Vascular Surgery  Daily Progress Note   Subjective  - 8 Days Post-Op  Still not working well with PT.  Has been seen by multiple services.  Renal function roughly stable over the past 3 days.  Objective Vitals:   11/02/18 0403 11/02/18 1605 11/02/18 2040 11/03/18 0325  BP:  (!) 139/58 134/63 (!) 130/54  Pulse: 74 (!) 58 63 (!) 56  Resp:  18 18 18   Temp:  98.6 F (37 C) 98.2 F (36.8 C) (!) 97.5 F (36.4 C)  TempSrc:  Oral Oral Oral  SpO2: 94% 95% 100% 95%  Weight:    67.4 kg  Height:        Intake/Output Summary (Last 24 hours) at 11/03/2018 1419 Last data filed at 11/03/2018 1341 Gross per 24 hour  Intake 308.79 ml  Output -  Net 308.79 ml    PULM  CTAB CV  RRR VASC  foot warm dressing is in place.  Good capillary refill  Laboratory CBC    Component Value Date/Time   WBC 11.1 (H) 11/03/2018 0444   HGB 9.6 (L) 11/03/2018 0444   HCT 30.7 (L) 11/03/2018 0444   PLT 296 11/03/2018 0444    BMET    Component Value Date/Time   NA 140 11/03/2018 0444   NA 139 02/04/2015 1305   K 3.5 11/03/2018 0444   CL 106 11/03/2018 0444   CO2 26 11/03/2018 0444   GLUCOSE 107 (H) 11/03/2018 0444   BUN 33 (H) 11/03/2018 0444   BUN 10 02/04/2015 1305   CREATININE 3.20 (H) 11/03/2018 0444   CREATININE 0.76 01/05/2016 1524   CALCIUM 8.2 (L) 11/03/2018 0444   GFRNONAA 14 (L) 11/03/2018 0444   GFRNONAA 83 01/05/2016 1524   GFRAA 16 (L) 11/03/2018 0444   GFRAA >89 01/05/2016 1524    Assessment/Planning: POD #10 s/p left leg revascularization   Waiting on placement  Will likely need SNF.  Not doing a lot with PT  Renal function has declined but been stable last few days.  Nephrology following.      Leotis Pain  11/03/2018, 2:19 PM

## 2018-11-03 NOTE — Progress Notes (Addendum)
Initial Nutrition Assessment  DOCUMENTATION CODES:   Not applicable  INTERVENTION:   Glucerna Shake po TID, each supplement provides 220 kcal and 10 grams of protein  MVI daily   Recommend Vitamin C 250mg  po BID  Recommend vitamin D supplementation   NUTRITION DIAGNOSIS:   Increased nutrient needs related to wound healing as evidenced by increased estimated needs.  GOAL:   Patient will meet greater than or equal to 90% of their needs  MONITOR:   PO intake, Supplement acceptance, Skin, I & O's, Labs  REASON FOR ASSESSMENT:   LOS    ASSESSMENT:   68 y.o. Caucasian female with medical problems of peripheral vascular disease, hypertension, chronic left foot ulcer, depression, polysubstance abuse, history of hep C treated, who was admitted to [facility] on 10/24/2018 for evaluation of left foot wound and underwent angiogram, PTA of left leg and then subsequently underwent left fifth ray amputation and I&D of plantar abscess on 10/26/2018   RD working remotely.  Spoke with pt briefly via phone today as palliative care was in room with patient at the time of RD's call. When asked how patient is eating, she reports "I am doing the best I can". Pt documented to be eating 50-100% of meals and is drinking some Glucerna. Pt already ordered for MVI. Would recommend vitamin C supplementation to support wound healing as well as vitamin D supplementation as pt noted to have low vitamin D in October 2019. Recommend Ensure vs Glucerna as this provides more calories and protein.   Per chart, pt's UBW appears to fluctuate ~165-179lbs. Pt appears to have lost 31lbs(17%) in <4 months which would be significant if true weight loss. Palliative care following.   Medications reviewed and include: aspirin, plavix, colace, heparin, MVI, KCl  Labs reviewed: K 3.5 wnl, BUN 33(H), creat 3.20(H), Mg 2.7(H) Vitamin D 28.74(L)- 04/2018 Wbc- 11.1(H), Hgb 9.6(L), Hct 30.7(L)  Unable to complete Nutrition-Focused  physical exam at this time.   Diet Order:   Diet Order            Diet Carb Modified Fluid consistency: Thin; Room service appropriate? Yes  Diet effective now             EDUCATION NEEDS:   Education needs have been addressed  Skin:  Skin Assessment: Reviewed RN Assessment(ecchymosis, incision L foot )  Last BM:  4/27- type 2  Height:   Ht Readings from Last 1 Encounters:  10/24/18 5\' 5"  (1.651 m)    Weight:   Wt Readings from Last 1 Encounters:  11/03/18 67.4 kg    Ideal Body Weight:  56.8 kg  BMI:  Body mass index is 24.73 kg/m.  Estimated Nutritional Needs:   Kcal:  1500-1700kcal/day   Protein:  75-85g/day   Fluid:  >1.4L/day   Koleen Distance MS, RD, LDN Pager #- [phone] Office#- [phone] After Hours Pager: [phone]

## 2018-11-03 NOTE — Evaluation (Addendum)
Occupational Therapy Evaluation Patient Details Name: Yesenia Shepard MRN: [id number] DOB: 1951-06-05 Today's Date: 11/03/2018    History of Present Illness 68 y.o.female with infected ulcerations on the left foot; s/p L 5th ray resection 2/2 to athelosclerosis of LE with ulceration. Patient is PNWB (50%) on LLE while wearing orthowedge shoe, and OOB for necessities only. Patient has PMH: HTN, DM, HLD, osteoporosis, Hep C.    Clinical Impression   Pt. Presents with weakness, limited activity tolerance, limited cognitive functioning, and limited functional mobility which hinders her ability to complete basic ADL and IADL functioning. Pt. Resides at home with her husband. Pt. required assistance with most ADLs, and IADL functioning: including meal preparation, and medication management. Pt. was referred to OT services for a cognitive assessment to determine how cognition may be affecting ADL functioning. The Montreal Cognitive Assessment [facility]) was administered assessing visuospatial/executive function, naming, memory, attention, language, abstraction, delayed recall, and orientation. Pt. Scored 19/30.(with norms being greater than or equal to 26/30) Delayed recall/memory was the area of greatest impairment. Pt. does present with intermittent delayed initiation for verbal responses, delayed task initiation, and requires increased time for processing. Pt. was able to accurately identify potential safety hazards, answer safety awareness /judgement questions for ADL/IADLs accurately. Pt. Could benefit from OT services for ADL training, A/E training, cognitive compensatory strategies during ADLs, and pt. education about home modification, and DME. Pt. would benefit from SNF level of care upon discharge. Pt. Could benefit from follow-up OT services at discharge. Pt. Could benefit from a formal assessment of cognitive function.    Follow Up Recommendations  SNF    Equipment Recommendations  3 in 1 bedside  commode    Recommendations for Other Services       Precautions / Restrictions Precautions Precautions: Fall Required Braces or Orthoses: Other Brace Other Brace: Orthowedge shoe Restrictions Weight Bearing Restrictions: Yes LLE Weight Bearing: Non weight bearing LLE Partial Weight Bearing Percentage or Pounds: 50%      Mobility Bed Mobility Overal bed mobility: Needs Assistance                Transfers     Transfers: Sit to/from Stand;Stand Pivot Transfers Sit to Stand: From elevated surface;Max assist Stand pivot transfers: +2 physical assistance;Max assist            Balance                                           ADL either performed or assessed with clinical judgement   ADL Overall ADL's : Needs assistance/impaired Eating/Feeding: Set up   Grooming: Set up;Moderate assistance   Upper Body Bathing: Set up;Moderate assistance   Lower Body Bathing: Set up;Maximal assistance   Upper Body Dressing : Set up;Maximal assistance                           Vision Baseline Vision/History: Wears glasses Wears Glasses: At all times       Perception     Praxis      Pertinent Vitals/Pain Pain Assessment: No/denies pain Pain Score: 2      Hand Dominance Right   Extremity/Trunk Assessment Upper Extremity Assessment Upper Extremity Assessment: Generalized weakness           Communication Communication Communication: No difficulties   Cognition Arousal/Alertness: Awake/alert Behavior During Therapy: Flat affect  Overall Cognitive Status: Within Functional Limits for tasks assessed                                 General Comments: Patient continues to require frequent cueing and increased processing time.   General Comments       Exercises   Shoulder Instructions      Home Living Family/patient expects to be discharged to:: Private residence Living Arrangements: Spouse/significant  other Available Help at Discharge: Family;Available PRN/intermittently Type of Home: House Home Access: Ramped entrance     Home Layout: Two level;Able to live on main level with bedroom/bathroom     Bathroom Shower/Tub: Tub/shower unit         Home Equipment: Walker - 2 wheels;Shower seat   Additional Comments: Patient used 2WW prior to admission.       Prior Functioning/Environment Level of Independence: Needs assistance        Comments: Patient reported needing assistance with most ADLs/IADLs, she has fallen multiple falls in the past 12 months. Her husband is available intermittently throughout the day.        OT Problem List: Decreased strength;Decreased activity tolerance;Decreased knowledge of use of DME or AE;Impaired UE functional use;Decreased coordination;Decreased range of motion;Decreased cognition      OT Treatment/Interventions: Self-care/ADL training;Therapeutic exercise;Patient/family education;Therapeutic activities;DME and/or AE instruction    OT Goals(Current goals can be found in the care plan section) Acute Rehab OT Goals Patient Stated Goal: To go home OT Goal Formulation: With patient  OT Frequency: Min 2X/week   Barriers to D/C:            Co-evaluation              AM-PAC OT "6 Clicks" Daily Activity     Outcome Measure Help from another person eating meals?: A Little Help from another person taking care of personal grooming?: A Lot Help from another person toileting, which includes using toliet, bedpan, or urinal?: A Lot Help from another person bathing (including washing, rinsing, drying)?: A Lot Help from another person to put on and taking off regular upper body clothing?: A Lot Help from another person to put on and taking off regular lower body clothing?: A Lot 6 Click Score: 13   End of Session Equipment Utilized During Treatment: Gait belt  Activity Tolerance: Patient tolerated treatment well Patient left: in bed;with  bed alarm set;with call bell/phone within reach  OT Visit Diagnosis: Muscle weakness (generalized) (M62.81)                Time: 6503-5465 OT Time Calculation (min): 45 min Charges:  OT General Charges $OT Visit: 1 Visit OT Evaluation $OT Eval Moderate Complexity: 1 Mod  Harrel Carina, MS, OTR/L  Harrel Carina 11/03/2018, 4:44 PM

## 2018-11-03 NOTE — Progress Notes (Signed)
PT Cancellation Note  Patient Details Name: Yesenia Shepard MRN: [id number] DOB: 1951-06-16   Cancelled Treatment:    Reason Eval/Treat Not Completed: Other (comment) Patient in conversation with other care providers on PT arrival. Patient expressed being in "a bad mood" after conversing with other providers 2/2 to health concerns/ fears. Patient feeling overwhelmed and fearful about her prognosis. PT will follow up at a later time for treatment session. Chart reviewed.  Myles Gip PT, DPT [phone] 11/03/2018, 11:26 AM

## 2018-11-04 DIAGNOSIS — R262 Difficulty in walking, not elsewhere classified: Secondary | ICD-10-CM | POA: Diagnosis not present

## 2018-11-04 DIAGNOSIS — Z7401 Bed confinement status: Secondary | ICD-10-CM | POA: Diagnosis not present

## 2018-11-04 DIAGNOSIS — I70245 Atherosclerosis of native arteries of left leg with ulceration of other part of foot: Secondary | ICD-10-CM | POA: Diagnosis not present

## 2018-11-04 DIAGNOSIS — Z89422 Acquired absence of other left toe(s): Secondary | ICD-10-CM | POA: Diagnosis not present

## 2018-11-04 DIAGNOSIS — F1123 Opioid dependence with withdrawal: Secondary | ICD-10-CM | POA: Diagnosis not present

## 2018-11-04 DIAGNOSIS — M2550 Pain in unspecified joint: Secondary | ICD-10-CM | POA: Diagnosis not present

## 2018-11-04 DIAGNOSIS — Z9181 History of falling: Secondary | ICD-10-CM | POA: Diagnosis not present

## 2018-11-04 DIAGNOSIS — Z95820 Peripheral vascular angioplasty status with implants and grafts: Secondary | ICD-10-CM | POA: Diagnosis not present

## 2018-11-04 DIAGNOSIS — I82411 Acute embolism and thrombosis of right femoral vein: Secondary | ICD-10-CM | POA: Diagnosis not present

## 2018-11-04 DIAGNOSIS — Z9049 Acquired absence of other specified parts of digestive tract: Secondary | ICD-10-CM | POA: Diagnosis not present

## 2018-11-04 DIAGNOSIS — G8929 Other chronic pain: Secondary | ICD-10-CM | POA: Diagnosis not present

## 2018-11-04 DIAGNOSIS — I2510 Atherosclerotic heart disease of native coronary artery without angina pectoris: Secondary | ICD-10-CM | POA: Diagnosis not present

## 2018-11-04 DIAGNOSIS — Z7982 Long term (current) use of aspirin: Secondary | ICD-10-CM | POA: Diagnosis not present

## 2018-11-04 DIAGNOSIS — F329 Major depressive disorder, single episode, unspecified: Secondary | ICD-10-CM | POA: Diagnosis not present

## 2018-11-04 DIAGNOSIS — Z7902 Long term (current) use of antithrombotics/antiplatelets: Secondary | ICD-10-CM | POA: Diagnosis not present

## 2018-11-04 DIAGNOSIS — F419 Anxiety disorder, unspecified: Secondary | ICD-10-CM | POA: Diagnosis not present

## 2018-11-04 DIAGNOSIS — F19939 Other psychoactive substance use, unspecified with withdrawal, unspecified: Secondary | ICD-10-CM | POA: Diagnosis not present

## 2018-11-04 DIAGNOSIS — I7025 Atherosclerosis of native arteries of other extremities with ulceration: Secondary | ICD-10-CM | POA: Diagnosis not present

## 2018-11-04 DIAGNOSIS — F418 Other specified anxiety disorders: Secondary | ICD-10-CM | POA: Diagnosis not present

## 2018-11-04 DIAGNOSIS — E876 Hypokalemia: Secondary | ICD-10-CM | POA: Diagnosis not present

## 2018-11-04 DIAGNOSIS — I739 Peripheral vascular disease, unspecified: Secondary | ICD-10-CM | POA: Diagnosis not present

## 2018-11-04 DIAGNOSIS — I82431 Acute embolism and thrombosis of right popliteal vein: Secondary | ICD-10-CM | POA: Diagnosis not present

## 2018-11-04 DIAGNOSIS — I70209 Unspecified atherosclerosis of native arteries of extremities, unspecified extremity: Secondary | ICD-10-CM | POA: Diagnosis not present

## 2018-11-04 DIAGNOSIS — F0390 Unspecified dementia without behavioral disturbance: Secondary | ICD-10-CM | POA: Diagnosis not present

## 2018-11-04 DIAGNOSIS — N170 Acute kidney failure with tubular necrosis: Secondary | ICD-10-CM | POA: Diagnosis not present

## 2018-11-04 DIAGNOSIS — M1990 Unspecified osteoarthritis, unspecified site: Secondary | ICD-10-CM | POA: Diagnosis not present

## 2018-11-04 DIAGNOSIS — E119 Type 2 diabetes mellitus without complications: Secondary | ICD-10-CM | POA: Diagnosis not present

## 2018-11-04 DIAGNOSIS — N390 Urinary tract infection, site not specified: Secondary | ICD-10-CM | POA: Diagnosis not present

## 2018-11-04 DIAGNOSIS — M86172 Other acute osteomyelitis, left ankle and foot: Secondary | ICD-10-CM | POA: Diagnosis not present

## 2018-11-04 DIAGNOSIS — F411 Generalized anxiety disorder: Secondary | ICD-10-CM | POA: Diagnosis not present

## 2018-11-04 DIAGNOSIS — L97529 Non-pressure chronic ulcer of other part of left foot with unspecified severity: Secondary | ICD-10-CM | POA: Diagnosis not present

## 2018-11-04 DIAGNOSIS — F410 Panic disorder [episodic paroxysmal anxiety] without agoraphobia: Secondary | ICD-10-CM | POA: Diagnosis not present

## 2018-11-04 DIAGNOSIS — D649 Anemia, unspecified: Secondary | ICD-10-CM | POA: Diagnosis not present

## 2018-11-04 DIAGNOSIS — Z791 Long term (current) use of non-steroidal anti-inflammatories (NSAID): Secondary | ICD-10-CM | POA: Diagnosis not present

## 2018-11-04 DIAGNOSIS — Z87891 Personal history of nicotine dependence: Secondary | ICD-10-CM | POA: Diagnosis not present

## 2018-11-04 DIAGNOSIS — Z4781 Encounter for orthopedic aftercare following surgical amputation: Secondary | ICD-10-CM | POA: Diagnosis not present

## 2018-11-04 DIAGNOSIS — Z20828 Contact with and (suspected) exposure to other viral communicable diseases: Secondary | ICD-10-CM | POA: Diagnosis not present

## 2018-11-04 DIAGNOSIS — F1111 Opioid abuse, in remission: Secondary | ICD-10-CM | POA: Diagnosis not present

## 2018-11-04 DIAGNOSIS — M150 Primary generalized (osteo)arthritis: Secondary | ICD-10-CM | POA: Diagnosis not present

## 2018-11-04 DIAGNOSIS — R2242 Localized swelling, mass and lump, left lower limb: Secondary | ICD-10-CM | POA: Diagnosis not present

## 2018-11-04 DIAGNOSIS — I10 Essential (primary) hypertension: Secondary | ICD-10-CM | POA: Diagnosis not present

## 2018-11-04 DIAGNOSIS — Z79899 Other long term (current) drug therapy: Secondary | ICD-10-CM | POA: Diagnosis not present

## 2018-11-04 DIAGNOSIS — F331 Major depressive disorder, recurrent, moderate: Secondary | ICD-10-CM | POA: Diagnosis not present

## 2018-11-04 DIAGNOSIS — F17211 Nicotine dependence, cigarettes, in remission: Secondary | ICD-10-CM | POA: Diagnosis not present

## 2018-11-04 DIAGNOSIS — R5381 Other malaise: Secondary | ICD-10-CM | POA: Diagnosis not present

## 2018-11-04 DIAGNOSIS — M6281 Muscle weakness (generalized): Secondary | ICD-10-CM | POA: Diagnosis not present

## 2018-11-04 LAB — BASIC METABOLIC PANEL
Anion gap: 9 (ref 5–15)
BUN: 31 mg/dL — ABNORMAL HIGH (ref 8–23)
CO2: 25 mmol/L (ref 22–32)
Calcium: 8 mg/dL — ABNORMAL LOW (ref 8.9–10.3)
Chloride: 105 mmol/L (ref 98–111)
Creatinine, Ser: 2.92 mg/dL — ABNORMAL HIGH (ref 0.44–1.00)
GFR calc Af Amer: 18 mL/min — ABNORMAL LOW (ref >60)
GFR calc non Af Amer: 16 mL/min — ABNORMAL LOW (ref >60)
Glucose, Bld: 88 mg/dL (ref 70–99)
Potassium: 3.7 mmol/L (ref 3.5–5.1)
Sodium: 139 mmol/L (ref 135–145)

## 2018-11-04 LAB — CBC
HCT: 30 % — ABNORMAL LOW (ref 36.0–46.0)
Hemoglobin: 9.5 g/dL — ABNORMAL LOW (ref 12.0–15.0)
MCH: 28.4 pg (ref 26.0–34.0)
MCHC: 31.7 g/dL (ref 30.0–36.0)
MCV: 89.8 fL (ref 80.0–100.0)
Platelets: 289 10*3/uL (ref 150–400)
RBC: 3.34 MIL/uL — ABNORMAL LOW (ref 3.87–5.11)
RDW: 13.5 % (ref 11.5–15.5)
WBC: 8.1 10*3/uL (ref 4.0–10.5)
nRBC: 0 % (ref 0.0–0.2)

## 2018-11-04 LAB — MAGNESIUM: Magnesium: 2.6 mg/dL — ABNORMAL HIGH (ref 1.7–2.4)

## 2018-11-04 MED ORDER — METHADONE HCL 10 MG/ML PO CONC: 80.0000 mg | Freq: Every day | ORAL | 0 refills | Status: DC

## 2018-11-04 MED ORDER — CLONAZEPAM 1 MG PO TABS: 1.0000 mg | ORAL_TABLET | Freq: Two times a day (BID) | ORAL | 0 refills | Status: DC

## 2018-11-04 MED ORDER — AMOXICILLIN-POT CLAVULANATE 500-125 MG PO TABS
1.0000 | ORAL_TABLET | Freq: Two times a day (BID) | ORAL | Status: DC
  Filled 2018-11-04 (×2): qty 1

## 2018-11-04 MED ORDER — AMOXICILLIN-POT CLAVULANATE 500-125 MG PO TABS: 1.0000 | ORAL_TABLET | Freq: Two times a day (BID) | ORAL | 0 refills | Status: AC

## 2018-11-04 NOTE — Discharge Instructions (Signed)
Podiatry Wound Dressing Instructions: 1) Heavy padded dressing with 4 x 4's 2) Abdominal pad 3) Kerlix wrap

## 2018-11-04 NOTE — TOC Progression Note (Signed)
Transition of Care [facility]) - Progression Note    Patient Details  Name: AZURI BOZARD MRN: [id number] Date of Birth: Sep 11, 1950  Transition of Care [facility]) CM/SW Privateer, RN Phone Number: 11/04/2018, 8:39 AM  Clinical Narrative:    Insurance auth received can go to [employer] once stable for DC   Expected Discharge Plan: [place] Barriers to Discharge: Continued Medical Work up  Expected Discharge Plan and Services Expected Discharge Plan: [place]   Discharge Planning Services: CM Consult   Living arrangements for the past 2 months: Single Family Home                                       Social Determinants of Health (SDOH) Interventions    Readmission Risk Interventions No flowsheet data found.

## 2018-11-04 NOTE — TOC Transition Note (Signed)
Transition of Care [facility]) - CM/SW Discharge Note   Patient Details  Name: Yesenia Shepard MRN: [id number] Date of Birth: [birth year]  Transition of Care [facility]) CM/SW Contact:  Su Hilt, RN Phone Number: 11/04/2018, 10:52 AM   Clinical Narrative:    patient to DC to [place] today to room 30 A EMS to transport, bedside nurse to call EMS,  Claiborne Billings at [place] aware of the patient coming today Bedside nurse to call report to [phone] Babette Relic made aware at [phone] Covid test none detected DC packet in the chart Bedside nurse aware  Final next level of care: Skilled Nursing Facility Barriers to Discharge: Barriers Resolved   Patient Goals and CMS Choice Patient states their goals for this hospitalization and ongoing recovery are:: get better      Discharge Placement              Patient chooses bed at: [facility] Patient to be transferred to facility by: EMS Name of family member notified: Babette Relic  Patient and family notified of of transfer: 11/04/18  Discharge Plan and Services   Discharge Planning Services: CM Consult                                 Social Determinants of Health (SDOH) Interventions     Readmission Risk Interventions No flowsheet data found.

## 2018-11-04 NOTE — Progress Notes (Signed)
Physical Therapy Treatment Patient Details Name: Yesenia Shepard MRN: [id number] DOB: Jun 03, 1951 Today's Date: 11/04/2018    History of Present Illness 68 y.o.female with infected ulcerations on the left foot; s/p L 5th ray resection 2/2 to athelosclerosis of LE with ulceration. Patient is PNWB (50%) on LLE while wearing orthowedge shoe, and OOB for necessities only. Patient has PMH: HTN, DM, HLD, osteoporosis, Hep C.     PT Comments    Upon PT arrival, patient in bed with flat affect. When asked if amenable to therapy, patient reports "I'm in shock. The doctor just told me I have to leave." PT explained extensively that discharging to the next venue of care is part of the recovery process and that discharge would not occur if she was not medically stable. Patient repeatedly states a fear of dying. Additionally patient articulates, "I feel like they are kicking me out because they need this bed." PT continued to educate patient on the benefits of discharging to STR/SNF to further her progress and return to her PLOF. PT encouraged patient to ask questions and get clarification regarding her concerns. After lengthy education on discharge, patient informed PT that she had a BM during her conversation with the previous provider and had been sitting in her feces since that time. At this time, PT notified nursing and discontinued the session.    Follow Up Recommendations  SNF     Equipment Recommendations  Other (comment);Wheelchair (measurements PT)    Recommendations for Other Services       Precautions / Restrictions Precautions Precautions: Fall Required Braces or Orthoses: Other Brace Other Brace: Orthowedge shoe Restrictions Weight Bearing Restrictions: Yes LLE Weight Bearing: Partial weight bearing LLE Partial Weight Bearing Percentage or Pounds: 50%    Mobility  Bed Mobility                  Transfers                    Ambulation/Gait                  Stairs             Wheelchair Mobility    Modified Rankin (Stroke Patients Only)       Balance                                            Cognition Arousal/Alertness: Awake/alert Behavior During Therapy: Flat affect Overall Cognitive Status: Within Functional Limits for tasks assessed                                 General Comments: Patient requires increased time for processing of information.      Exercises Other Exercises Other Exercises: Patient educated extensively on discharging and need for continued PT services to promote optimal function and safety.    General Comments        Pertinent Vitals/Pain Pain Assessment: No/denies pain    Home Living                      Prior Function            PT Goals (current goals can now be found in the care plan section) Acute Rehab PT Goals Patient Stated Goal: go home PT Goal  Formulation: With patient Time For Goal Achievement: 11/11/18 Potential to Achieve Goals: Fair Progress towards PT goals: (Patient expressed fear of dying.)    Frequency    7X/week      PT Plan Current plan remains appropriate    Co-evaluation              AM-PAC PT "6 Clicks" Mobility   Outcome Measure  Help needed turning from your back to your side while in a flat bed without using bedrails?: A Lot Help needed moving from lying on your back to sitting on the side of a flat bed without using bedrails?: A Lot Help needed moving to and from a bed to a chair (including a wheelchair)?: A Lot Help needed standing up from a chair using your arms (e.g., wheelchair or bedside chair)?: A Lot Help needed to walk in hospital room?: Total Help needed climbing 3-5 steps with a railing? : Total 6 Click Score: 10    End of Session Equipment Utilized During Treatment: Gait belt Activity Tolerance: Patient limited by fatigue Patient left: in bed;with call bell/phone within reach;with  bed alarm set(with nursing on [facility])   PT Visit Diagnosis: Muscle weakness (generalized) (M62.81);Difficulty in walking, not elsewhere classified (R26.2);History of falling (Z91.81)     Time: 6812-7517 PT Time Calculation (min) (ACUTE ONLY): 21 min  Charges:  $Therapeutic Activity: 8-22 mins                    Myles Gip PT, DPT [phone] 11/04/2018, 9:27 AM

## 2018-11-04 NOTE — Consult Note (Signed)
Pharmacy Antibiotic Note  Yesenia Shepard is a 68 y.o. female admitted on 10/24/2018 with osteomyelitis.  Pharmacy has been consulted for Augmentin dosing.  Plan: Augmentin 500mg  po q 12h  Height: 5\' 5"  (165.1 cm) Weight: 148 lb 9.6 oz (67.4 kg) IBW/kg (Calculated) : 57  Temp (24hrs), Avg:98.3 F (36.8 C), Min:98 F (36.7 C), Max:98.5 F (36.9 C)  Recent Labs  Lab 10/28/18 1024 10/29/18 0947  10/31/18 0445 11/01/18 0544 11/02/18 0450 11/03/18 0444 11/04/18 0512  WBC  --  8.6   < > 11.0* 11.5* 11.7* 11.1* 8.1  CREATININE  --  3.20*   < > 3.36* 3.32* 3.18* 3.20* 2.92*  VANCORANDOM 39 40  --   --   --   --   --   --    < > = values in this interval not displayed.    Estimated Creatinine Clearance: 16.6 mL/min (A) (by C-G formula based on SCr of 2.92 mg/dL (H)).    No Known Allergies  Antimicrobials this admission: Vanc 4/19> 4/22 Zosyn 4/19>4/22 Unasyn 4/22 >>4/28 Augmentin 4/28 >> (plan to dose until 5/5)  Microbiology results: 4/19 WndCx: E Faecalis (and moderate mixed) - amp sensitive   4/18 MRSA PCR: NEG  Thank you for allowing pharmacy to be a part of this patient's care.  Lu Duffel, PharmD, BCPS Clinical Pharmacist 11/04/2018 9:34 AM

## 2018-11-04 NOTE — Progress Notes (Signed)
Report given to [place] at [facility]. IV removed. EMS called. Stable condition. Patient is ready for transport.

## 2018-11-04 NOTE — Progress Notes (Signed)
[facility], [place] 11/04/18  Subjective:  Patient continues to have diminished renal function but has improved a bit. BUN currently 31 with a creatinine of 2.9. EGFR up to 16. Patient being discharged to a facility today.  Objective:  Vital signs in last 24 hours:  Temp:  [98 F (36.7 C)-98.5 F (36.9 C)] 98 F (36.7 C) (04/28 0739) Pulse Rate:  [61-66] 61 (04/28 0739) Resp:  [16-18] 16 (04/28 0739) BP: (140-142)/(50-72) 140/72 (04/28 0739) SpO2:  [95 %-98 %] 95 % (04/28 0739)  Weight change:  Filed Weights   10/31/18 0512 11/01/18 0326 11/03/18 0325  Weight: 67.3 kg 67 kg 67.4 kg    Intake/Output:    Intake/Output Summary (Last 24 hours) at 11/04/2018 1115 Last data filed at 11/04/2018 0948 Gross per 24 hour  Intake 360 ml  Output 150 ml  Net 210 ml     Physical Exam: General:  No acute distress, sitting up in the bed  HEENT  anicteric, moist oral mucous membranes  Neck  supple  Pulm/lungs  Room air, normal breathing effort, clear  CVS/Heart  regular rhythm  Abdomen:   Soft, nontender  Extremities:  Left foot in bandage  Neurologic:  Alert, able to answer questions appropriately  Skin:  No acute rashes    Basic Metabolic Panel:  Recent Labs  Lab 10/31/18 0445 11/01/18 0544 11/02/18 0450 11/02/18 0819 11/02/18 2006 11/03/18 0444 11/04/18 0512  NA 141 139 138  --   --  140 139  K 3.5 3.0* 2.7* 3.0* 3.3* 3.5 3.7  CL 104 104 101  --   --  106 105  CO2 28 25 25   --   --  26 25  GLUCOSE 74 82 97  --   --  107* 88  BUN 28* 32* 36*  --   --  33* 31*  CREATININE 3.36* 3.32* 3.18*  --   --  3.20* 2.92*  CALCIUM 8.3* 8.2* 8.0*  --   --  8.2* 8.0*  MG 2.6* 2.6* 2.6*  --   --  2.7* 2.6*     CBC: Recent Labs  Lab 10/31/18 0445 11/01/18 0544 11/02/18 0450 11/03/18 0444 11/04/18 0512  WBC 11.0* 11.5* 11.7* 11.1* 8.1  HGB 9.8* 10.0* 10.0* 9.6* 9.5*  HCT 32.1* 32.5* 31.8* 30.7* 30.0*  MCV 90.7 90.5 88.8 90.6 89.8  PLT 313  319 330 296 289      Lab Results  Component Value Date   HEPBSAG NEGATIVE 10/18/2015   HEPBSAB NEG 10/18/2015      Microbiology:  Recent Results (from the past 240 hour(s))  Aerobic/Anaerobic Culture (surgical/deep wound)     Status: None   Collection Time: 10/26/18  8:29 AM  Result Value Ref Range Status   Specimen Description   Final    BONE 5TH METATARSAL Performed at [facility], [address] [place], [place] [address]    Special Requests   Final    NONE Performed at [facility], [address] [place], [place] [address]    Gram Stain   Final    MODERATE WBC PRESENT, PREDOMINANTLY PMN FEW GRAM POSITIVE COCCI    Culture   Final    MODERATE CORYNEBACTERIUM SPECIES MODERATE ENTEROCOCCUS FAECALIS MODERATE PEPTOSTREPTOCOCCUS MICROS MODERATE PREVOTELLA SPECIES BETA LACTAMASE POSITIVE Performed at [facility], [place] [address], [place], [place] [address]    Report Status 11/01/2018 FINAL  Final   Organism ID, Bacteria ENTEROCOCCUS FAECALIS  Final      Susceptibility   Enterococcus faecalis - MIC*    AMPICILLIN <=2 SENSITIVE Sensitive     VANCOMYCIN 1 SENSITIVE Sensitive     GENTAMICIN SYNERGY SENSITIVE Sensitive     * MODERATE ENTEROCOCCUS FAECALIS  Aerobic/Anaerobic Culture (surgical/deep wound)     Status: None   Collection Time: 10/26/18  8:33 AM  Result Value Ref Range Status   Specimen Description   Final    BONE 5TH METATARSAL Performed at [facility], [place] [address] [place], [place] [address]    Special Requests   Final    NONE Performed at [facility], [address] [place], [place] [address]    Gram Stain   Final    ABUNDANT WBC PRESENT, PREDOMINANTLY PMN FEW GRAM POSITIVE COCCI    Culture   Final    MODERATE CORYNEBACTERIUM SPECIES MODERATE ENTEROCOCCUS FAECALIS MODERATE PEPTOSTREPTOCOCCUS MICROS MODERATE PREVOTELLA SPECIES BETA LACTAMASE POSITIVE Performed at [facility], [place] [address] [place], [place] [address]    Report Status 11/01/2018 FINAL  Final   Organism ID, Bacteria ENTEROCOCCUS FAECALIS  Final      Susceptibility   Enterococcus faecalis - MIC*    AMPICILLIN <=2 SENSITIVE Sensitive     VANCOMYCIN 1 SENSITIVE Sensitive     GENTAMICIN SYNERGY SENSITIVE Sensitive     * MODERATE ENTEROCOCCUS FAECALIS  Novel Coronavirus, NAA (hospital order; send-out to ref lab)     Status: None   Collection Time: 10/31/18 11:35 AM  Result Value Ref Range Status   SARS-CoV-2, NAA NOT DETECTED NOT DETECTED Final    Comment: (NOTE) This test was developed and its performance characteristics determined by [employer]. This test has not been FDA cleared or approved. This test has been authorized by FDA under an Emergency Use Authorization (EUA). This test has been validated in accordance with the FDA's Guidance Document (Policy for [place] in Laboratories Certified to Perform High Complexity Testing under CLIA prior to Emergency Use Authorization for Coronavirus [id number] during the [employer] Emergency) issued on February 29th, 2020. FDA independent review of this validation is pending. This test is only authorized for the duration of time the declaration that circumstances exist justifying the authorization of the emergency use of in vitro diagnostic tests for detection of SARS-CoV- 2 virus and/or diagnosis of COVID-19 infection under section 564(b)(1) of the Act, 21 U.S.C. 563JSH-7(W)(2), unless the authorization is terminated or revoked sooner. Performed At: [facility] [place], [place] [id number] Rush Farmer MD YD:[id number]    [place]  Final    Comment: Performed at [facility], [address] [place], [place] [address]    Coagulation Studies: No results for input(s): LABPROT, INR in the last 72 hours.  Urinalysis: No results for input(s): COLORURINE, LABSPEC, PHURINE,  GLUCOSEU, HGBUR, BILIRUBINUR, KETONESUR, PROTEINUR, UROBILINOGEN, NITRITE, LEUKOCYTESUR in the last 72 hours.  Invalid input(s): APPERANCEUR    Imaging: No results found.   Medications:   . sodium chloride Stopped (10/27/18 1259)   . amoxicillin-clavulanate  1 tablet Oral Q12H  . ARIPiprazole  2 mg Oral QHS  . aspirin EC  81 mg Oral Daily  . atorvastatin  40 mg Oral Daily  . cholecalciferol  1,000 Units Oral Daily  . clonazePAM  1 mg Oral BID  . clopidogrel  75 mg Oral Daily  . docusate sodium  200 mg Oral BID  . escitalopram  5 mg Oral Daily  .  feeding supplement (GLUCERNA SHAKE)  237 mL Oral BID BM  . heparin  5,000 Units Subcutaneous Q8H  . mouth rinse  15 mL Mouth Rinse BID  . methadone  80 mg Oral Daily  . multivitamins with iron  1 tablet Oral Daily  . potassium chloride  40 mEq Oral Daily  . tamsulosin  0.4 mg Oral QPC supper  . vitamin C  250 mg Oral BID   sodium chloride, acetaminophen **OR** acetaminophen, ondansetron **OR** ondansetron (ZOFRAN) IV, polyethylene glycol, sorbitol, traMADol  Assessment/ Plan:  68 y.o. Caucasian female with medical problems of peripheral vascular disease, hypertension, chronic left foot ulcer, depression, polysubstance abuse, history of hep C treated, who was admitted to [facility] on 10/24/2018 for evaluation of left foot wound and underwent angiogram, PTA of left leg and then subsequently underwent left fifth ray amputation and I&D of plantar abscess on 10/26/2018   1.  Acute kidney injury Likely secondary to ATN secondary to IV contrast exposure as well as vancomycin toxicity Baseline creatinine is 0.73 from October 24, 2018.   -Renal function marginally improved today.  Creatinine down to 2.9 with an EGFR of 16.  We are okay with discharge however patient will need very close follow-up in our office.  We recommend checking another BMP on Thursday at the facility.  We plan to see her back in the office early next week.  2.   Hypokalemia -Potassium improved to 3.7.  Continue to periodically monitor.    LOS: 11 [facility] [place] 4/28/202011:15 [place], [place]  Note: This note was prepared with Dragon dictation. Any transcription errors are unintentional

## 2018-11-06 ENCOUNTER — Emergency Department
Admission: EM | Admit: 2018-11-06 | Discharge: 2018-11-07 | Disposition: A | Discharge status: Home / Self Care | Payer: Medicare HMO | Attending: Emergency Medicine | Admitting: Emergency Medicine

## 2018-11-06 ENCOUNTER — Encounter: Payer: Self-pay | Admitting: Intensive Care

## 2018-11-06 ENCOUNTER — Other Ambulatory Visit: Payer: Self-pay

## 2018-11-06 DIAGNOSIS — F1123 Opioid dependence with withdrawal: Secondary | ICD-10-CM | POA: Insufficient documentation

## 2018-11-06 DIAGNOSIS — E119 Type 2 diabetes mellitus without complications: Secondary | ICD-10-CM | POA: Insufficient documentation

## 2018-11-06 DIAGNOSIS — Z7902 Long term (current) use of antithrombotics/antiplatelets: Secondary | ICD-10-CM | POA: Insufficient documentation

## 2018-11-06 DIAGNOSIS — I2510 Atherosclerotic heart disease of native coronary artery without angina pectoris: Secondary | ICD-10-CM | POA: Insufficient documentation

## 2018-11-06 DIAGNOSIS — Z9181 History of falling: Secondary | ICD-10-CM | POA: Insufficient documentation

## 2018-11-06 DIAGNOSIS — F419 Anxiety disorder, unspecified: Secondary | ICD-10-CM | POA: Diagnosis not present

## 2018-11-06 DIAGNOSIS — Z20828 Contact with and (suspected) exposure to other viral communicable diseases: Secondary | ICD-10-CM | POA: Diagnosis not present

## 2018-11-06 DIAGNOSIS — I10 Essential (primary) hypertension: Secondary | ICD-10-CM | POA: Diagnosis not present

## 2018-11-06 DIAGNOSIS — M6281 Muscle weakness (generalized): Secondary | ICD-10-CM | POA: Diagnosis not present

## 2018-11-06 DIAGNOSIS — Z87891 Personal history of nicotine dependence: Secondary | ICD-10-CM | POA: Insufficient documentation

## 2018-11-06 DIAGNOSIS — Z9049 Acquired absence of other specified parts of digestive tract: Secondary | ICD-10-CM | POA: Insufficient documentation

## 2018-11-06 DIAGNOSIS — Z79899 Other long term (current) drug therapy: Secondary | ICD-10-CM | POA: Insufficient documentation

## 2018-11-06 DIAGNOSIS — Z7982 Long term (current) use of aspirin: Secondary | ICD-10-CM | POA: Insufficient documentation

## 2018-11-06 DIAGNOSIS — F329 Major depressive disorder, single episode, unspecified: Secondary | ICD-10-CM | POA: Diagnosis not present

## 2018-11-06 DIAGNOSIS — F1193 Opioid use, unspecified with withdrawal: Secondary | ICD-10-CM

## 2018-11-06 HISTORY — DX: Type 2 diabetes mellitus without complications: E11.9

## 2018-11-06 MED ORDER — ATORVASTATIN CALCIUM 20 MG PO TABS
40.0000 mg | ORAL_TABLET | Freq: Every day | ORAL | Status: DC
  Administered 2018-11-07: 40 mg via ORAL
  Filled 2018-11-06: qty 2

## 2018-11-06 MED ORDER — CLOPIDOGREL BISULFATE 75 MG PO TABS
75.0000 mg | ORAL_TABLET | Freq: Every day | ORAL | Status: DC
  Administered 2018-11-07: 75 mg via ORAL
  Filled 2018-11-06: qty 1

## 2018-11-06 MED ORDER — CLONAZEPAM 0.5 MG PO TABS
1.0000 mg | ORAL_TABLET | Freq: Two times a day (BID) | ORAL | Status: DC
  Administered 2018-11-07 (×2): 1 mg via ORAL
  Filled 2018-11-06 (×2): qty 2

## 2018-11-06 MED ORDER — ARIPIPRAZOLE 2 MG PO TABS
2.0000 mg | ORAL_TABLET | Freq: Every day | ORAL | Status: DC
  Administered 2018-11-07: 2 mg via ORAL
  Filled 2018-11-06 (×2): qty 1

## 2018-11-06 MED ORDER — DOCUSATE SODIUM 100 MG PO CAPS: 200.0000 mg | ORAL_CAPSULE | Freq: Every day | ORAL | Status: DC | PRN

## 2018-11-06 MED ORDER — ESCITALOPRAM OXALATE 10 MG PO TABS
5.0000 mg | ORAL_TABLET | Freq: Every day | ORAL | Status: DC
  Administered 2018-11-07: 5 mg via ORAL
  Filled 2018-11-06: qty 1

## 2018-11-06 MED ORDER — ASPIRIN EC 81 MG PO TBEC
81.0000 mg | DELAYED_RELEASE_TABLET | Freq: Every day | ORAL | Status: DC
  Administered 2018-11-07: 81 mg via ORAL
  Filled 2018-11-06: qty 1

## 2018-11-06 MED ORDER — POTASSIUM CHLORIDE CRYS ER 20 MEQ PO TBCR
40.0000 meq | EXTENDED_RELEASE_TABLET | Freq: Every day | ORAL | Status: DC
  Administered 2018-11-07: 40 meq via ORAL
  Filled 2018-11-06: qty 2

## 2018-11-06 MED ORDER — FERROUS SULFATE 325 (65 FE) MG PO TABS
325.0000 mg | ORAL_TABLET | Freq: Every day | ORAL | Status: DC
  Filled 2018-11-06: qty 1

## 2018-11-06 MED ORDER — METHADONE HCL 10 MG/ML PO CONC
100.0000 mg | Freq: Once | ORAL | Status: AC
  Administered 2018-11-06: 100 mg via ORAL
  Filled 2018-11-06: qty 10

## 2018-11-06 MED ORDER — LISINOPRIL 10 MG PO TABS
10.0000 mg | ORAL_TABLET | Freq: Every day | ORAL | Status: DC
  Administered 2018-11-07: 10 mg via ORAL
  Filled 2018-11-06: qty 1

## 2018-11-06 MED ORDER — VITAMIN D 25 MCG (1000 UNIT) PO TABS
1000.0000 [IU] | ORAL_TABLET | Freq: Every day | ORAL | Status: DC
  Filled 2018-11-06: qty 1

## 2018-11-06 MED ORDER — POLYETHYLENE GLYCOL 3350 17 G PO PACK: 17.0000 g | PACK | Freq: Every day | ORAL | Status: DC | PRN

## 2018-11-06 NOTE — Evaluation (Signed)
Physical Therapy Evaluation Patient Details Name: Yesenia Shepard MRN: [id number] DOB: [birth year] Today's Date: 11/06/2018   History of Present Illness  68 y.o.female presented to ED with withdrawal symptoms. Recent admission for infected ulcerations on the left foot; s/p L 5th ray resection 2/2 to athelosclerosis of LE with ulceration. Per last admission, pt is PNWB (50%) on LLE while wearing orthowedge shoe, and OOB for necessities only. Patient has PMH: HTN, DM, HLD, osteoporosis, Hep C.      Clinical Impression  Pt in bed, in NAD at start of session, oriented to self and situation. Behavior WFLs, however pt did need intermittent cues to redirect to task as well extended time for processing. Pt reported after recent hospital discharge she was sent to STR but was unhappy, case management aware. Per chart review and pt, pt lives with husband, who is unable to provide physical assistance.   The patient was able to verbalize weight bearing precautions of LLE, and necessity of post-op shoe. Of note, post-op shoe not in room, able to acquire one for hospital use. Pt performed supine to sit with supervision and verbal cues for sequencing. Pt attempted sit<> stand x2. able to complete with minAx2 second attempt CGAx2. Pt tremuluous throughout standing. Extensive verbal cues for foot flat on RLE, and to adhere to weight bearing precautions on RLE. Pt able to side step to left for a few steps, and to the right for a few steps. MinA for RW management CGAx2 throughout for safety. Pt tends to ambulate on toes of bilateral feet, fair carryover of PWB precautions on RLE. Pt fatigued after sidestepping at bedside.  Overall the patient demonstrated deficits (see "PT Problem List") that impede the patient's functional abilities, safety, and mobility and would benefit from skilled PT intervention. PT spent time educating pt on current level of mobility and assistance needed and importance of potential STR to improve pt  safety and independence.       Follow Up Recommendations SNF    Equipment Recommendations  Wheelchair (measurements PT)    Recommendations for Other Services       Precautions / Restrictions Precautions Precautions: Fall Required Braces or Orthoses: Other Brace Other Brace: Orthowedge shoe Restrictions Weight Bearing Restrictions: Yes LLE Weight Bearing: Partial weight bearing LLE Partial Weight Bearing Percentage or Pounds: 50% Other Position/Activity Restrictions: OOB for toileting or chair transfer      Mobility  Bed Mobility Overal bed mobility: Needs Assistance Bed Mobility: Supine to Sit;Sit to Supine     Supine to sit: Supervision Sit to supine: Supervision   General bed mobility comments: Cues for sequencing and redirection to task  Transfers Overall transfer level: Needs assistance Equipment used: Rolling walker (2 wheeled) Transfers: Sit to/from Stand Sit to Stand: From elevated surface;Min assist;Min guard;+2 safety/equipment         General transfer comment: Pt attempted x2. able to complete with minAx2 second attempt CGAx2. Pt tremuluous throughout standing. Extensive verbal cues for foot flat on RLE, and to adhere to weight bearing precautions on RLE.  Ambulation/Gait Ambulation/Gait assistance: Min assist Gait Distance (Feet): 3 Feet Assistive device: Rolling walker (2 wheeled)       General Gait Details: Pt able to side step to L for a few steps, and to the R for a few steps. MinA for RW management CGAx2 throughout for safety. Pt tends to ambulate on toes of bilateral feet, fair carryover of PWB precautions on RLE. Pt fatigued after sidestepping at bedside.   Stairs  Wheelchair Mobility    Modified Rankin (Stroke Patients Only)       Balance Overall balance assessment: Needs assistance Sitting-balance support: Feet supported Sitting balance-Leahy Scale: Fair Sitting balance - Comments: CGA for seated therapeutic  exercises, bilateral feet unsupported from elevated ED gurney   Standing balance support: Bilateral upper extremity supported Standing balance-Leahy Scale: Poor                               Pertinent Vitals/Pain Pain Assessment: No/denies pain    Home Living Family/patient expects to be discharged to:: Private residence Living Arrangements: Spouse/significant other Available Help at Discharge: Family;Available 24 hours/day Type of Home: House Home Access: Ramped entrance     Home Layout: One level Home Equipment: Walker - 2 wheels;Shower seat Additional Comments: Patient used 2WW prior to admission.     Prior Function           Comments: Per chart review prior most recent hospital admission, pt needed assistance with ADLs, reported able to shower independently intermittently. Per patient her husband is disabled and would be unable to physically assist her.     Hand Dominance   Dominant Hand: Right    Extremity/Trunk Assessment   Upper Extremity Assessment Upper Extremity Assessment: Generalized weakness    Lower Extremity Assessment Lower Extremity Assessment: Generalized weakness       Communication   Communication: No difficulties  Cognition Arousal/Alertness: Awake/alert Behavior During Therapy: Flat affect Overall Cognitive Status: Within Functional Limits for tasks assessed                                 General Comments: Patient requires increased time for processing of information.      General Comments      Exercises General Exercises - Lower Extremity Long Arc Quad: AROM;Both;10 reps Other Exercises Other Exercises: Pt able to sit EOB for several minutes with fair balance, supervision/CGA for weight shifts for safety Other Exercises: Pt able to verbalize proper weight bearing precautions   Assessment/Plan    PT Assessment Patient needs continued PT services  PT Problem List Decreased strength;Decreased  mobility;Decreased safety awareness;Decreased knowledge of precautions;Decreased activity tolerance;Decreased balance;Decreased knowledge of use of DME;Decreased skin integrity;Decreased range of motion       PT Treatment Interventions DME instruction;Functional mobility training;Balance training;Patient/family education;Gait training;Therapeutic activities;Neuromuscular re-education;Therapeutic exercise;Stair training    PT Goals (Current goals can be found in the Care Plan section)  Acute Rehab PT Goals Patient Stated Goal: to go to a facility that will let her have methadone  PT Goal Formulation: With patient Time For Goal Achievement: 11/20/18 Potential to Achieve Goals: Fair    Frequency Min 2X/week   Barriers to discharge Decreased caregiver support      Co-evaluation               AM-PAC PT "6 Clicks" Mobility  Outcome Measure Help needed turning from your back to your side while in a flat bed without using bedrails?: A Lot Help needed moving from lying on your back to sitting on the side of a flat bed without using bedrails?: A Lot Help needed moving to and from a bed to a chair (including a wheelchair)?: A Lot Help needed standing up from a chair using your arms (e.g., wheelchair or bedside chair)?: A Lot Help needed to walk in hospital room?: Total Help needed climbing  3-5 steps with a railing? : Total 6 Click Score: 10    End of Session Equipment Utilized During Treatment: Gait belt Activity Tolerance: Patient limited by fatigue Patient left: in bed;with call bell/phone within reach Nurse Communication: Mobility status PT Visit Diagnosis: Muscle weakness (generalized) (M62.81);Difficulty in walking, not elsewhere classified (R26.2);History of falling (Z91.81)    Time: 8295-6213 PT Time Calculation (min) (ACUTE ONLY): 29 min   Charges:   PT Evaluation $PT Eval Moderate Complexity: 1 Mod PT Treatments $Therapeutic Activity: 8-22 mins       Lieutenant Diego PT, DPT 2:52 PM,11/06/18 [phone]

## 2018-11-06 NOTE — ED Notes (Signed)
Provided patient with a dinner tray

## 2018-11-06 NOTE — ED Notes (Signed)
Pt gave verbal permission for me to speak with sister Karna Christmas) over the phone to update her on the plan.

## 2018-11-06 NOTE — ED Notes (Signed)
PT used bedpan and updated about plan of care

## 2018-11-06 NOTE — ED Notes (Signed)
PT using phone

## 2018-11-06 NOTE — ED Notes (Signed)
PT at bedside.

## 2018-11-06 NOTE — TOC Progression Note (Addendum)
Transition of Care [facility]) - Progression Note    Patient Details  Name: Yesenia Shepard MRN: [id number] Date of Birth: 09/01/1950  Transition of Care [facility]) CM/SW Contact  [name] Phone Number: 11/06/2018, 6:29 PM  Clinical Narrative:     Patient admitted to the ED from [facility] where patient was receiving short term rehab.  Patient takes Methadone 7 days a week which she has been doing for 30 years.  Patient normally goes to the [facility] [phone] every Tuesday to get 7 daily doses of Methadone.  CSW was informed by SNF that their pharmacy is not able to provide 7 daily doses of Methadone which patient normally takes.  CSW contacted clinical social work Tourist information centre manager to discuss how to get patient's daily dose of Methadone.  He suggested to contact Dr. Doy Hutching the medical director, and find out what he suggested needs to be done to help provide patient with her proper doses.  CSW spoke to Dr. Doy Hutching and he said to contact the physician who normally provides Methadone prescription for patient to see what can be done to help the patient receive her doses at the SNF.  CSW to contact Methadone supplier tomorrow to discuss process.  CSW contacted [facility] and informed her of what medical director suggested.  Patient is agreeable to returning back to SNF if she is able to receive her Methadone doses.  CSW to continue to follow patient's progress throughout discharge planning.  CSW updated attending physician regarding barriers for discharge.   Expected Discharge Plan: [facility](Pt would like to go home with home health if possible ) Barriers to Discharge: No Barriers Identified  Expected Discharge Plan and Services Expected Discharge Plan: [facility](Pt would like to go home with home health if possible )   Discharge Planning Services: CM Consult   Living arrangements for the past 2 months:  [place]: [facility])         Social Determinants of Health (SDOH) Interventions    Readmission Risk Interventions No flowsheet data found.

## 2018-11-06 NOTE — ED Triage Notes (Signed)
Patient arrived by EMS from [facility]. Patient recently had amputation of L foot pinky toe and was at [facility] for rehab. Patient is here with c/o withdrawals from methadone. Reports facility took her off. Patient also expresses she does not want to go back to [facility]. MD at bedside. Pt A&O x4

## 2018-11-06 NOTE — NC FL2 (Addendum)
[place] LEVEL OF CARE SCREENING TOOL     IDENTIFICATION  Patient Name: Yesenia Shepard Birthdate: 09/02/1950 Sex: female Admission Date (Current Location): 11/06/2018  [place] and [place] Number:  Engineering geologist and Address:  [facility], [address] [place], [place] [address]      Provider Number: [id number]  Attending Physician Name and Address:  Harvest Dark, MD  Relative Name and Phone Number:  Aleen Campi [phone]    Current Level of Care: Hospital Recommended Level of Care: [place] Prior Approval Number:    Date Approved/Denied: 11/06/18 PASRR Number: [id number]  Discharge Plan: SNF    Current Diagnoses: Patient Active Problem List   Diagnosis Date Noted  . Atherosclerotic peripheral vascular disease with ulceration ([place]) 10/24/2018  . Atherosclerosis of artery of extremity with ulceration ([place]) 10/24/2018  . Atherosclerosis of native arteries of extremity with rest pain ([place]) 10/01/2018  . Chronic midline low back pain with bilateral sciatica 04/23/2018  . Prediabetes 04/10/2018  . Vitamin D deficiency 04/10/2018  . Swelling of limb 03/01/2018  . Atherosclerosis of native arteries of extremity with intermittent claudication ([place]) 01/03/2018  . Leg pain 12/12/2017  . S/P right rotator cuff repair 01/16/2017  . Preoperative examination 01/14/2017  . Right shoulder pain 09/06/2016  . Hepatic cirrhosis ([place]) 02/09/2016  . Chronic hepatitis C without hepatic coma ([place]) 11/09/2015  . HLD (hyperlipidemia) 08/20/2015  . Peripheral vascular disease ([place]) 08/12/2015  . Essential hypertension 08/12/2015  . DM type 2 (diabetes mellitus, type 2) ([place]) 04/19/2015  . Current smoker 03/15/2015  . History of recreational drug use 03/15/2015  . Osteoarthritis 03/15/2015  . Preventative health care 03/15/2015  . Ulcer of left lower leg, limited to breakdown of skin ([place]) 03/15/2015  .  Anxiety and depression 08/25/2012    Orientation RESPIRATION BLADDER Height & Weight     Self, Time, Situation, Place  Normal Continent Weight: 142 lb (64.4 kg) Height:  5\' 5"  (165.1 cm)  BEHAVIORAL SYMPTOMS/MOOD NEUROLOGICAL BOWEL NUTRITION STATUS      Continent Diet  AMBULATORY STATUS COMMUNICATION OF NEEDS Skin   Extensive Assist Verbally Normal, Surgical wounds                       Personal Care Assistance Level of Assistance  Dressing, Bathing Bathing Assistance: Limited assistance   Dressing Assistance: Limited assistance     Functional Limitations Info  Sight, Hearing, Speech Sight Info: Adequate Hearing Info: Adequate Speech Info: Adequate    SPECIAL CARE FACTORS FREQUENCY  PT (By licensed PT), OT (By licensed OT)     PT Frequency: Minimum 5x a week OT Frequency: Minimum 5x a week            Contractures Contractures Info: Not present    Additional Factors Info  Code Status, Allergies, Psychotropic Code Status Info: Full Code Allergies Info: No Known Allergies Psychotropic Info: ARIPiprazole (ABILIFY) 2 MG tablet and clonazePAM (KLONOPIN) 1 MG tablet and escitalopram (LEXAPRO) 5 MG tablet         Current Medications (11/06/2018):  This is the current hospital active medication list No current facility-administered medications for this encounter.    Current Outpatient Medications  Medication Sig Dispense Refill  . ARIPiprazole (ABILIFY) 2 MG tablet Take 1 tablet (2 mg total) by mouth at bedtime.    Marland Kitchen aspirin 81 MG tablet Take 81 mg by mouth daily.    Marland Kitchen atorvastatin (LIPITOR) 40 MG tablet  Take 1 tablet (40 mg total) by mouth daily. 90 tablet 3  . cholecalciferol (VITAMIN D3) 25 MCG (1000 UT) tablet Take 1 tablet (1,000 Units total) by mouth daily.    . clonazePAM (KLONOPIN) 1 MG tablet Take 1 tablet (1 mg total) by mouth 2 (two) times daily. 30 tablet 0  . clopidogrel (PLAVIX) 75 MG tablet TAKE 1 TABLET BY MOUTH ONCE DAILY 30 tablet 11  .  escitalopram (LEXAPRO) 5 MG tablet Take 1 tablet (5 mg total) by mouth daily.    . feeding supplement, GLUCERNA SHAKE, (GLUCERNA SHAKE) LIQD Take 237 mLs by mouth 2 (two) times daily between meals.  0  . ferrous sulfate 325 (65 FE) MG tablet Take 325 mg by mouth daily.    Marland Kitchen lisinopril (PRINIVIL,ZESTRIL) 10 MG tablet Take 1 tablet (10 mg total) by mouth daily. 90 tablet 3  . Multiple Vitamins-Iron (MULTIVITAMINS WITH IRON) TABS tablet Take 1 tablet by mouth daily.  0  . potassium chloride SA (K-DUR) 20 MEQ tablet Take 2 tablets (40 mEq total) by mouth daily.    . vitamin C (VITAMIN C) 250 MG tablet Take 1 tablet (250 mg total) by mouth 2 (two) times daily.    Marland Kitchen acetaminophen (TYLENOL) 325 MG tablet Take 2 tablets (650 mg total) by mouth every 6 (six) hours as needed for mild pain (or Fever >/= 101).    Marland Kitchen amoxicillin-clavulanate (AUGMENTIN) 500-125 MG tablet Take 1 tablet (500 mg total) by mouth every 12 (twelve) hours for 7 days. 14 tablet 0  . docusate sodium (COLACE) 100 MG capsule Take 2 capsules (200 mg total) by mouth daily as needed for mild constipation or moderate constipation. 10 capsule 0  . methadone (DOLOPHINE) 10 MG/ML solution Take 8 mLs (80 mg total) by mouth daily. (Patient not taking: Reported on 11/06/2018) 30 mL 0  . polyethylene glycol (MIRALAX / GLYCOLAX) 17 g packet Take 17 g by mouth daily as needed for severe constipation. 14 each 0   Facility-Administered Medications Ordered in Other Encounters  Medication Dose Route Frequency Provider Last Rate Last Dose  . ceFAZolin (ANCEF) IVPB 2g/100 mL premix  2 g Intravenous Once Kris Hartmann, NP         Discharge Medications: Please see discharge summary for a list of discharge medications.  Relevant Imaging Results:  Relevant Lab Results:   Additional Information SSN [id number]  Ross Ludwig, LCSW

## 2018-11-06 NOTE — ED Provider Notes (Addendum)
[facility] Emergency Department Provider Note  Time seen: 12:24 PM  I have reviewed the triage vital signs and the nursing notes.   HISTORY  Chief Complaint Withdrawal   HPI Yesenia Shepard is a 68 y.o. female with a past medical history of anxiety, chronic pain, diabetes, presents to the emergency department for withdrawal.  According to the patient she takes 110 mg of methadone daily for the last 25 years.  Patient was placed at [facility] 3 days ago following a partial left foot amputation.  Since arriving at [facility] and they are unable to provide methadone to the patient.  Patient states she was having withdrawal symptoms today including increased pain, shakiness and not feeling well.  [facility] sent the patient to the emergency department as they cannot adequately care for the patient at their facility as they cannot dose methadone at least to the level that she requires.  Patient denies any fever cough or congestion, no other concerning symptoms at this time per patient.  She is requesting something to eat and drink.    Past Medical History:  Diagnosis Date  . Anxiety   . Arthritis   . Chronic pain   . Depression   . Diabetes mellitus without complication ([place])   . Drug abuse ([place])    History of polysubstance abuse; currently on methadone.  . Hepatitis    History of Hep "C". treated and cured with Harvoni  . History of chicken pox   . Hypertension   . Panic attacks   . Peripheral vascular disease ([place])    stent in place.   . Pre-diabetes   . UTI (urinary tract infection)    History of    Patient Active Problem List   Diagnosis Date Noted  . Atherosclerotic peripheral vascular disease with ulceration ([place]) 10/24/2018  . Atherosclerosis of artery of extremity with ulceration ([place]) 10/24/2018  . Atherosclerosis of native arteries of extremity with rest pain ([place]) 10/01/2018  . Chronic midline low back pain with bilateral  sciatica 04/23/2018  . Prediabetes 04/10/2018  . Vitamin D deficiency 04/10/2018  . Swelling of limb 03/01/2018  . Atherosclerosis of native arteries of extremity with intermittent claudication ([place]) 01/03/2018  . Leg pain 12/12/2017  . S/P right rotator cuff repair 01/16/2017  . Preoperative examination 01/14/2017  . Right shoulder pain 09/06/2016  . Hepatic cirrhosis ([place]) 02/09/2016  . Chronic hepatitis C without hepatic coma ([place]) 11/09/2015  . HLD (hyperlipidemia) 08/20/2015  . Peripheral vascular disease ([place]) 08/12/2015  . Essential hypertension 08/12/2015  . DM type 2 (diabetes mellitus, type 2) ([place]) 04/19/2015  . Current smoker 03/15/2015  . History of recreational drug use 03/15/2015  . Osteoarthritis 03/15/2015  . Preventative health care 03/15/2015  . Ulcer of left lower leg, limited to breakdown of skin ([place]) 03/15/2015  . Anxiety and depression 08/25/2012    Past Surgical History:  Procedure Laterality Date  . ABDOMINAL HYSTERECTOMY  1989  . AMPUTATION TOE Left 10/26/2018   Procedure: 5TH RAY RESCTION;  Surgeon: Albertine Patricia, DPM;  Location: [facility] ORS;  Service: Podiatry;  Laterality: Left;  . CHOLECYSTECTOMY  1987  . INTRACAPSULAR CATARACT EXTRACTION Left   . LOWER EXTREMITY ANGIOGRAPHY Left 12/17/2017   Procedure: LOWER EXTREMITY ANGIOGRAPHY;  Surgeon: Katha Cabal, MD;  Location: [place] CV LAB;  Service: Cardiovascular;  Laterality: Left;  . LOWER EXTREMITY ANGIOGRAPHY Left 10/24/2018   Procedure: LOWER EXTREMITY ANGIOGRAPHY;  Surgeon: Algernon Huxley, MD;  [facility]  INVASIVE CV LAB;  Service: Cardiovascular;  Laterality: Left;  . PERIPHERAL VASCULAR CATHETERIZATION Left 05/04/2015   Procedure: Lower Extremity Angiography;  Surgeon: Katha Cabal, MD;  Location: [place] CV LAB;  Service: Cardiovascular;  Laterality: Left;  . PERIPHERAL VASCULAR CATHETERIZATION Left 05/04/2015   Procedure: Lower Extremity Intervention;  Surgeon: Katha Cabal, MD;  Location: [place] CV LAB;  Service: Cardiovascular;  Laterality: Left;  . SHOULDER ARTHROSCOPY WITH OPEN ROTATOR CUFF REPAIR Right 01/16/2017   Procedure: SHOULDER ARTHROSCOPY WITH OPEN ROTATOR CUFF REPAIR;  Surgeon: Thornton Park, MD;  Location: [facility] ORS;  Service: Orthopedics;  Laterality: Right;  . TONSILLECTOMY AND ADENOIDECTOMY  1971    Prior to Admission medications   Medication Sig Start Date End Date Taking? Authorizing Provider  acetaminophen (TYLENOL) 325 MG tablet Take 2 tablets (650 mg total) by mouth every 6 (six) hours as needed for mild pain (or Fever >/= 101). 11/03/18   Stegmayer, Joelene Millin A, PA-C  amoxicillin-clavulanate (AUGMENTIN) 500-125 MG tablet Take 1 tablet (500 mg total) by mouth every 12 (twelve) hours for 7 days. 11/04/18 11/11/18  Stegmayer, Joelene Millin A, PA-C  ARIPiprazole (ABILIFY) 2 MG tablet Take 1 tablet (2 mg total) by mouth at bedtime. 11/03/18   Stegmayer, Janalyn Harder, PA-C  aspirin 81 MG tablet Take 81 mg by mouth daily.    [provider]  atorvastatin (LIPITOR) 40 MG tablet Take 1 tablet (40 mg total) by mouth daily. Patient taking differently: Take 40 mg by mouth every other day.  04/10/18   [name], [name], MD  cholecalciferol (VITAMIN D3) 25 MCG (1000 UT) tablet Take 1 tablet (1,000 Units total) by mouth daily. 11/04/18   Stegmayer, Joelene Millin A, PA-C  clonazePAM (KLONOPIN) 1 MG tablet Take 1 tablet (1 mg total) by mouth 2 (two) times daily. 11/04/18   Stegmayer, Janalyn Harder, PA-C  clopidogrel (PLAVIX) 75 MG tablet TAKE 1 TABLET BY MOUTH ONCE DAILY Patient not taking: No sig reported 12/23/17   Schnier, Dolores Lory, MD  docusate sodium (COLACE) 100 MG capsule Take 2 capsules (200 mg total) by mouth daily as needed for mild constipation or moderate constipation. 11/03/18   Stegmayer, Joelene Millin A, PA-C  escitalopram (LEXAPRO) 5 MG tablet Take 1 tablet (5 mg total) by mouth daily. 11/04/18   Stegmayer, Joelene Millin A, PA-C  feeding  supplement, GLUCERNA SHAKE, (GLUCERNA SHAKE) LIQD Take 237 mLs by mouth 2 (two) times daily between meals. 11/03/18   Stegmayer, Joelene Millin A, PA-C  lisinopril (PRINIVIL,ZESTRIL) 10 MG tablet Take 1 tablet (10 mg total) by mouth daily. Patient not taking: Reported on 10/24/2018 04/23/18   [name], [name], MD  methadone (DOLOPHINE) 10 MG/ML solution Take 8 mLs (80 mg total) by mouth daily. 11/04/18   Stegmayer, Janalyn Harder, PA-C  Multiple Vitamins-Iron (MULTIVITAMINS WITH IRON) TABS tablet Take 1 tablet by mouth daily. 11/04/18   Stegmayer, Joelene Millin A, PA-C  polyethylene glycol (MIRALAX / GLYCOLAX) 17 g packet Take 17 g by mouth daily as needed for severe constipation. 11/03/18   Stegmayer, Joelene Millin A, PA-C  potassium chloride SA (K-DUR) 20 MEQ tablet Take 2 tablets (40 mEq total) by mouth daily. 11/04/18   Stegmayer, Janalyn Harder, PA-C  vitamin C (VITAMIN C) 250 MG tablet Take 1 tablet (250 mg total) by mouth 2 (two) times daily. 11/03/18   Stegmayer, Joelene Millin A, PA-C    No Known Allergies  Family History  Problem Relation Age of Onset  . Arthritis Mother   . Mental illness Mother  depression  . Diabetes Mother   . Cancer Mother        pancreatic  . Cancer Father        colon  . Heart disease Father   . Diabetes Father   . Cancer Daughter        lung    Social History Social History   Tobacco Use  . Smoking status: Former Smoker    Packs/day: 0.50    Years: 30.00    Pack years: 15.00    Types: Cigarettes    Last attempt to quit: 07/25/2018    Years since quitting: 0.2  . Smokeless tobacco: Never Used  . Tobacco comment: Currently smoking 1/2 ppd  Substance Use Topics  . Alcohol use: No    Alcohol/week: 0.0 standard drinks  . Drug use: No    Comment: Hx of.    Review of Systems Constitutional: Negative for fever. Cardiovascular: Negative for chest pain. Respiratory: Negative for shortness of breath. Gastrointestinal: Negative for abdominal pain Musculoskeletal:  Negative for musculoskeletal complaints Neurological: Negative for headache All other ROS negative  ____________________________________________   PHYSICAL EXAM:  VITAL SIGNS: ED Triage Vitals  Enc Vitals Group     BP 11/06/18 1205 (!) 158/76     Pulse Rate 11/06/18 1205 73     Resp 11/06/18 1205 (!) 22     Temp 11/06/18 1205 98.3 F (36.8 C)     Temp Source 11/06/18 1205 Oral     SpO2 11/06/18 1205 96 %     Weight 11/06/18 1206 142 lb (64.4 kg)     Height 11/06/18 1206 5\' 5"  (1.651 m)     Head Circumference --      Peak Flow --      Pain Score 11/06/18 1206 0     Pain Loc --      Pain Edu? --      Excl. in [place]? --    Constitutional: Alert and oriented. Well appearing and in no distress. Eyes: Normal exam ENT      Head: Normocephalic and atraumatic      Mouth/Throat: Mucous membranes are moist. Cardiovascular: Normal rate, regular rhythm. Respiratory: Normal respiratory effort without tachypnea nor retractions. Breath sounds are clear Gastrointestinal: Soft and nontender. No distention. Musculoskeletal: Nontender with normal range of motion in all extremities.  Neurologic:  Normal speech and language. No gross focal neurologic deficits Skin:  Skin is warm.  Patient has a well-appearing surgical incision to the lateral aspect of the left foot Psychiatric: Mood and affect are normal.   ____________________________________________    EKG  EKG viewed and interpreted by myself shows a normal sinus rhythm at 75 bpm with a narrow QRS, normal axis, normal intervals, no concerning ST changes.  ____________________________________________   INITIAL IMPRESSION / ASSESSMENT AND PLAN / ED COURSE  Pertinent labs & imaging results that were available during my care of the patient were reviewed by me and considered in my medical decision making (see chart for details).   Patient presents emergency department for symptoms of narcotic withdrawal described as increased pain,  shakiness and not feeling well.  I discussed the patient with [facility] via phone, they are unable to care for the patient due to her methadone requirement.  We confirmed with her methadone clinic that she takes 100 mg of methadone daily.  We will dose methadone in the emergency department.  We will discuss with social work to see if they are able to help place the patient  at a facility that can treat her adequately.  Patient has no concerning findings on exam.  Well-appearing surgical incision of the left foot.  Social work has requested PT eval.  Will obtain social work and PT evaluation for possible placement.  Yesenia Shepard was evaluated in Emergency Department on 11/06/2018 for the symptoms described in the history of present illness. She was evaluated in the context of the global COVID-19 pandemic, which necessitated consideration that the patient might be at risk for infection with the SARS-CoV-2 virus that causes COVID-19. Institutional protocols and algorithms that pertain to the evaluation of patients at risk for COVID-19 are in a state of rapid change based on information released by regulatory bodies including the CDC and federal and state organizations. These policies and algorithms were followed during the patient's care in the ED.    ----------------------------------------- 1:18 PM on 11/07/2018 -----------------------------------------  Social work has been working with the patient, they were able to arrange for methadone administration at [facility].  Patient received her dose of methadone today 11/07/2018.  Patient will be discharged to [facility].  ____________________________________________   FINAL CLINICAL IMPRESSION(S) / ED DIAGNOSES  Narcotic withdrawal   Harvest Dark, MD 11/06/18 1312    Harvest Dark, MD 11/06/18 1442    Harvest Dark, MD 11/07/18 1318

## 2018-11-06 NOTE — TOC Progression Note (Signed)
Transition of Care [facility]) - Progression Note    Patient Details  Name: Yesenia Shepard MRN: [id number] Date of Birth: 09-09-1950  Transition of Care [facility]) CM/SW Contact  Fredric Mare, LCSW Phone Number:  [phone] 11/06/2018, 5:43 PM  Clinical Narrative:    CSW contacted pt's husband, Iona Beard [phone]) to learn where pt receives her Methadone.  Iona Beard stated that she receives it from [facility], [phone] every Tuesday.  Pt's husband was updated on how she was doing.    Expected Discharge Plan: [place] [facility](Pt would like to go home with home health if possible ) Barriers to Discharge: No Barriers Identified  Expected Discharge Plan and Services Expected Discharge Plan: [place] [facility](Pt would like to go home with home health if possible )   Discharge Planning Services: CM Consult   Living arrangements for the past 2 months: [place]: [facility]         Social Determinants of Health (SDOH) Interventions    Readmission Risk Interventions No flowsheet data found.

## 2018-11-06 NOTE — TOC Initial Note (Signed)
Transition of Care [facility]) - Initial/Assessment Note    Patient Details  Name: Yesenia Shepard MRN: [id number] Date of Birth: Aug 13, 1950  Transition of Care [facility]) CM/SW Contact:    Fredric Mare, LCSW Phone Number: 11/06/2018, 1:27 PM  Clinical Narrative:                 CSW met with pt to assess current needs. Pt stated that she did not like her experience at [facility] and shared that she did not want to go back.  Pt shared that she would prefer to go home with home health. Pt currently lives at home with her husband, and shared that he will be able to assist her, and provide transportation.  CSW let her know that we are waiting for PT consult and will discuss more options afterward; pt agreed.    Expected Discharge Plan: [place] [facility](Pt would like to go home with home health if possible ) Barriers to Discharge: No Barriers Identified   Patient Goals and CMS Choice        Expected Discharge Plan and Services Expected Discharge Plan: [place] [facility](Pt would like to go home with home health if possible )   Discharge Planning Services: CM Consult   Living arrangements for the past 2 months: [place]: [facility])        Prior Living Arrangements/Services Living arrangements for the past 2 months: Single Family Home Lives with:: Spouse Patient language and need for interpreter reviewed:: Yes Do you feel safe going back to the place where you live?: Yes          Current home services: Home PT, Home RN, Home OT Criminal Activity/Legal Involvement Pertinent to Current Situation/Hospitalization: No - Comment as needed  Activities of Daily Living      Permission Sought/Granted                  Emotional Assessment Appearance:: Appears stated age Attitude/Demeanor/Rapport: Engaged Affect (typically observed): Calm Orientation: : Oriented to Self, Oriented  to Place, Oriented to  Time, Oriented to Situation Alcohol / Substance Use: Tobacco Use Psych Involvement: No (comment)  Admission diagnosis:  withdrawals Patient Active Problem List   Diagnosis Date Noted  . Atherosclerotic peripheral vascular disease with ulceration ([place]) 10/24/2018  . Atherosclerosis of artery of extremity with ulceration ([place]) 10/24/2018  . Atherosclerosis of native arteries of extremity with rest pain ([place]) 10/01/2018  . Chronic midline low back pain with bilateral sciatica 04/23/2018  . Prediabetes 04/10/2018  . Vitamin D deficiency 04/10/2018  . Swelling of limb 03/01/2018  . Atherosclerosis of native arteries of extremity with intermittent claudication ([place]) 01/03/2018  . Leg pain 12/12/2017  . S/P right rotator cuff repair 01/16/2017  . Preoperative examination 01/14/2017  . Right shoulder pain 09/06/2016  . Hepatic cirrhosis ([place]) 02/09/2016  . Chronic hepatitis C without hepatic coma ([place]) 11/09/2015  . HLD (hyperlipidemia) 08/20/2015  . Peripheral vascular disease ([place]) 08/12/2015  . Essential hypertension 08/12/2015  . DM type 2 (diabetes mellitus, type 2) ([place]) 04/19/2015  . Current smoker 03/15/2015  . History of recreational drug use 03/15/2015  . Osteoarthritis 03/15/2015  . Preventative health care 03/15/2015  . Ulcer of left lower leg, limited to breakdown of skin ([place]) 03/15/2015  .  Anxiety and depression 08/25/2012   PCP:  [name], [name], MD Pharmacy:   [facility] [address], [place] - [place] [address] [place] [address] Phone: [phone] Fax: [place], [place] - [address] [facility]. [address] [place] [address] Phone: [phone] Fax: [phone]     Social Determinants of Health (SDOH) Interventions    Readmission Risk Interventions No flowsheet data found.

## 2018-11-06 NOTE — ED Notes (Signed)
Patient reports she is here for withdrawals from methadone. NAD noted. Unlabored breathing. A&O x4. Patient also expresses she does not want to go back to [facility]

## 2018-11-07 MED ORDER — METHADONE HCL 10 MG/ML PO CONC
100.0000 mg | Freq: Once | ORAL | Status: AC
  Administered 2018-11-07: 100 mg via ORAL
  Filled 2018-11-07: qty 10

## 2018-11-07 NOTE — ED Notes (Signed)
PT repositioned to right side

## 2018-11-07 NOTE — ED Notes (Signed)
Pt provided with a breakfast tray.  

## 2018-11-07 NOTE — ED Notes (Signed)
PT placed on bedpan

## 2018-11-07 NOTE — TOC Transition Note (Signed)
Transition of Care [facility]) - CM/SW Discharge Note   Patient Details  Name: YASSMINE TAMM MRN: [id number] Date of Birth: [birth year]  [facility]) CM/SW Contact:  Ross Ludwig, LCSW Phone Number: 11/07/2018, 1:40 PM   Clinical Narrative:  Patient will be discharging to [facility] to continue with her therapy.  SNF was able to make arrangements with methadone clinic to provide the medication for the patient while she is at [facility].  CSW updated patient, and she was very grateful that the medication was able to be provided.  CSW contacted patient's husband Babette Relic, [phone] and updated him of patient's discharge plan.    Patient to be d/c'ed today to [facility] room 30A.  Patient and family agreeable to plans will transport via ems RN to call report to [phone].  CSW notified patient's husband as well.       Final next level of care: Skilled Nursing Facility Barriers to Discharge: Barriers Resolved   Patient Goals and CMS Choice Patient states their goals for this hospitalization and ongoing recovery are:: Patient is in agreement to returning back to [facility] to continue with her therapy so she can return back home with her husband. CMS Medicare.gov Compare Post Acute Care list provided to:: Patient Represenative (must comment) Choice offered to / list presented to : Patient  Discharge Placement   Existing PASRR number confirmed : 11/06/18          Patient chooses bed at: [facility] Patient to be transferred to facility by: [facility] Name of family member notified: Babette Relic [phone] Patient and family notified of of transfer: 11/07/18  Discharge Plan and Services   Discharge Planning Services: CM Consult                        [facility] Agency: [facility])        Social Determinants of Health (SDOH) Interventions     Readmission Risk Interventions No  flowsheet data found.

## 2018-11-07 NOTE — Discharge Instructions (Signed)
Patient has received her dose of methadone 11/07/2018.  Please resume methadone administration tomorrow 11/08/2018.

## 2018-11-07 NOTE — TOC Progression Note (Signed)
Transition of Care [facility]) - Progression Note    Patient Details  Name: Yesenia Shepard MRN: [id number] Date of Birth: Aug 20, 1950  Transition of Care [facility]) CM/SW Contact  Ross Ludwig, [place] Phone Number: 11/07/2018, 11:11 AM  Clinical Narrative:     CSW spoke with [place] nurse at [place] methadone clinic [phone] to discuss how patient can get her daily doses of Methadone.  Patient takes methadone 7 days a week which she has been doing for 30 years.  She stated if there is a way the patient is able to get to the clinic the Methadone doses can be given to her for the week.  CSW spoke with [facility] admissions worker Claiborne Billings and she stated the administrator spoke with the [place], and clinic agreed that the administrator can pick patient's medications up for her once they have received a release from patient.  CSW spoke to patient and she signed medical release, CSW faxed release form to [place] [facility] at [phone].  The clinic will provide a lock box for patient to keep her meds in.  CSW was informed that once SNF confirms that they have received the patient's Methadone, then they are able to accept patient and she can discharge back to SNF.  CSW updated physician and will continue to facilitate discharge planning.  11/07/18 Update Expected Discharge Plan:  Patient has agreed to return to [facility] SNF for short term rehab as long as she can receive her Methadone dose daily.  Expected Discharge Plan: [place] [facility](Pt would like to go home with home health if possible ) Barriers to Discharge: No Barriers Identified  Expected Discharge Plan and Services Expected Discharge Plan: [place] [facility](Pt would like to go home with home health if possible )   Discharge Planning Services: CM Consult   Living arrangements for the past 2 months: [place]: [facility])         Social Determinants of Health (SDOH) Interventions    Readmission Risk Interventions No flowsheet data found.

## 2018-11-07 NOTE — ED Notes (Signed)
PT resting comfortably. Respirations even and unlabored. PT woken up briefly to roll to other side.

## 2018-11-07 NOTE — ED Notes (Signed)
Lunch meal tray given.  

## 2018-11-21 ENCOUNTER — Other Ambulatory Visit (INDEPENDENT_AMBULATORY_CARE_PROVIDER_SITE_OTHER): Payer: Self-pay | Admitting: Vascular Surgery

## 2018-11-21 DIAGNOSIS — Z95820 Peripheral vascular angioplasty status with implants and grafts: Secondary | ICD-10-CM

## 2018-11-25 ENCOUNTER — Encounter (INDEPENDENT_AMBULATORY_CARE_PROVIDER_SITE_OTHER): Payer: Medicare HMO

## 2018-11-25 ENCOUNTER — Ambulatory Visit (INDEPENDENT_AMBULATORY_CARE_PROVIDER_SITE_OTHER): Payer: Medicare HMO | Admitting: Nurse Practitioner

## 2018-11-28 DIAGNOSIS — F0390 Unspecified dementia without behavioral disturbance: Secondary | ICD-10-CM | POA: Diagnosis not present

## 2018-11-28 DIAGNOSIS — I7025 Atherosclerosis of native arteries of other extremities with ulceration: Secondary | ICD-10-CM | POA: Diagnosis not present

## 2018-11-28 DIAGNOSIS — M1990 Unspecified osteoarthritis, unspecified site: Secondary | ICD-10-CM | POA: Diagnosis not present

## 2018-11-28 DIAGNOSIS — G8929 Other chronic pain: Secondary | ICD-10-CM | POA: Diagnosis not present

## 2018-11-28 DIAGNOSIS — I10 Essential (primary) hypertension: Secondary | ICD-10-CM | POA: Diagnosis not present

## 2018-11-28 DIAGNOSIS — F418 Other specified anxiety disorders: Secondary | ICD-10-CM | POA: Diagnosis not present

## 2018-11-28 DIAGNOSIS — I739 Peripheral vascular disease, unspecified: Secondary | ICD-10-CM | POA: Diagnosis not present

## 2018-12-07 DIAGNOSIS — F331 Major depressive disorder, recurrent, moderate: Secondary | ICD-10-CM | POA: Diagnosis not present

## 2018-12-07 DIAGNOSIS — F410 Panic disorder [episodic paroxysmal anxiety] without agoraphobia: Secondary | ICD-10-CM | POA: Diagnosis not present

## 2018-12-09 ENCOUNTER — Ambulatory Visit (INDEPENDENT_AMBULATORY_CARE_PROVIDER_SITE_OTHER): Payer: Medicare HMO | Admitting: Nurse Practitioner

## 2018-12-09 ENCOUNTER — Encounter (INDEPENDENT_AMBULATORY_CARE_PROVIDER_SITE_OTHER): Payer: Self-pay | Admitting: Nurse Practitioner

## 2018-12-09 ENCOUNTER — Ambulatory Visit (INDEPENDENT_AMBULATORY_CARE_PROVIDER_SITE_OTHER): Payer: Medicare HMO

## 2018-12-09 ENCOUNTER — Other Ambulatory Visit: Payer: Self-pay

## 2018-12-09 VITALS — BP 94/57 | HR 76 | Resp 16 | Wt 138.0 lb

## 2018-12-09 DIAGNOSIS — Z79899 Other long term (current) drug therapy: Secondary | ICD-10-CM

## 2018-12-09 DIAGNOSIS — Z791 Long term (current) use of non-steroidal anti-inflammatories (NSAID): Secondary | ICD-10-CM | POA: Diagnosis not present

## 2018-12-09 DIAGNOSIS — M8949 Other hypertrophic osteoarthropathy, multiple sites: Secondary | ICD-10-CM

## 2018-12-09 DIAGNOSIS — M150 Primary generalized (osteo)arthritis: Secondary | ICD-10-CM | POA: Diagnosis not present

## 2018-12-09 DIAGNOSIS — L97909 Non-pressure chronic ulcer of unspecified part of unspecified lower leg with unspecified severity: Secondary | ICD-10-CM

## 2018-12-09 DIAGNOSIS — I70245 Atherosclerosis of native arteries of left leg with ulceration of other part of foot: Secondary | ICD-10-CM

## 2018-12-09 DIAGNOSIS — I70299 Other atherosclerosis of native arteries of extremities, unspecified extremity: Secondary | ICD-10-CM

## 2018-12-09 DIAGNOSIS — L97529 Non-pressure chronic ulcer of other part of left foot with unspecified severity: Secondary | ICD-10-CM | POA: Diagnosis not present

## 2018-12-09 DIAGNOSIS — E119 Type 2 diabetes mellitus without complications: Secondary | ICD-10-CM | POA: Diagnosis not present

## 2018-12-09 DIAGNOSIS — Z95820 Peripheral vascular angioplasty status with implants and grafts: Secondary | ICD-10-CM | POA: Diagnosis not present

## 2018-12-09 DIAGNOSIS — F17211 Nicotine dependence, cigarettes, in remission: Secondary | ICD-10-CM | POA: Diagnosis not present

## 2018-12-09 DIAGNOSIS — M159 Polyosteoarthritis, unspecified: Secondary | ICD-10-CM

## 2018-12-14 ENCOUNTER — Encounter (INDEPENDENT_AMBULATORY_CARE_PROVIDER_SITE_OTHER): Payer: Self-pay | Admitting: Nurse Practitioner

## 2018-12-14 NOTE — Progress Notes (Signed)
SUBJECTIVE:  Patient ID: Yesenia Shepard, female    DOB: [birth year], 68 y.o.   MRN: [id number] Chief Complaint  Patient presents with  . Follow-up    [facility] 3week ABI    HPI  Yesenia Shepard is a 68 y.o. female The patient returns to the office for followup and review of the noninvasive studies. There have been no interval changes in lower extremity symptoms. No interval shortening of the patient's claudication distance or development of rest pain symptoms. No new ulcers or wounds have occurred since the last visit.The previous wound is healing well on her left foot.    There have been no significant changes to the patient's overall health care.  The patient denies amaurosis fugax or recent TIA symptoms. There are no recent neurological changes noted. The patient denies history of DVT, PE or superficial thrombophlebitis. The patient denies recent episodes of angina or shortness of breath.   ABI Rt=1.38 and Lt=1.36  (previous ABI's Rt=0.93 and Lt=0.63) Duplex ultrasound of the bilateral tibial arteries reveal triphasic waveforms.   Past Medical History:  Diagnosis Date  . Anxiety   . Arthritis   . Chronic pain   . Depression   . Diabetes mellitus without complication ([place])   . Drug abuse ([place])    History of polysubstance abuse; currently on methadone.  . Hepatitis    History of Hep "C". treated and cured with Harvoni  . History of chicken pox   . Hypertension   . Panic attacks   . Peripheral vascular disease ([place])    stent in place.   . Pre-diabetes   . UTI (urinary tract infection)    History of    Past Surgical History:  Procedure Laterality Date  . ABDOMINAL HYSTERECTOMY  1989  . AMPUTATION TOE Left 10/26/2018   Procedure: 5TH RAY RESCTION;  Surgeon: Albertine Patricia, DPM;  Location: [facility] ORS;  Service: Podiatry;  Laterality: Left;  . CHOLECYSTECTOMY  1987  . INTRACAPSULAR CATARACT EXTRACTION Left   . LOWER EXTREMITY ANGIOGRAPHY Left 12/17/2017   Procedure: LOWER EXTREMITY  ANGIOGRAPHY;  Surgeon: Katha Cabal, MD;  Location: [place] CV LAB;  Service: Cardiovascular;  Laterality: Left;  . LOWER EXTREMITY ANGIOGRAPHY Left 10/24/2018   Procedure: LOWER EXTREMITY ANGIOGRAPHY;  Surgeon: Algernon Huxley, MD;  Location: [place] CV LAB;  Service: Cardiovascular;  Laterality: Left;  . PERIPHERAL VASCULAR CATHETERIZATION Left 05/04/2015   Procedure: Lower Extremity Angiography;  Surgeon: Katha Cabal, MD;  Location: [place] CV LAB;  Service: Cardiovascular;  Laterality: Left;  . PERIPHERAL VASCULAR CATHETERIZATION Left 05/04/2015   Procedure: Lower Extremity Intervention;  Surgeon: Katha Cabal, MD;  Location: [place] CV LAB;  Service: Cardiovascular;  Laterality: Left;  . SHOULDER ARTHROSCOPY WITH OPEN ROTATOR CUFF REPAIR Right 01/16/2017   Procedure: SHOULDER ARTHROSCOPY WITH OPEN ROTATOR CUFF REPAIR;  Surgeon: Thornton Park, MD;  Location: [facility] ORS;  Service: Orthopedics;  Laterality: Right;  . TONSILLECTOMY AND ADENOIDECTOMY  1971    Social History   Socioeconomic History  . Marital status: Divorced    Spouse name: Not on file  . Number of children: Not on file  . Years of education: Not on file  . Highest education level: Not on file  Occupational History  . Not on file  Social Needs  . Financial resource strain: Not hard at all  . Food insecurity:    Worry: Never true    Inability: Never true  . Transportation needs:  Medical: No    Non-medical: No  Tobacco Use  . Smoking status: Former Smoker    Packs/day: 0.50    Years: 30.00    Pack years: 15.00    Types: Cigarettes    Last attempt to quit: 07/25/2018    Years since quitting: 0.3  . Smokeless tobacco: Never Used  . Tobacco comment: Currently smoking 1/2 ppd  Substance and Sexual Activity  . Alcohol use: No    Alcohol/week: 0.0 standard drinks  . Drug use: No    Comment: Hx of.  . Sexual activity: Not Currently  Lifestyle  . Physical activity:    Days  per week: 0 days    Minutes per session: Not on file  . Stress: Not on file  Relationships  . Social connections:    Talks on phone: Not on file    Gets together: Not on file    Attends religious service: Not on file    Active member of club or organization: Not on file    Attends meetings of clubs or organizations: Not on file    Relationship status: Not on file  . Intimate partner violence:    Fear of current or ex partner: No    Emotionally abused: No    Physically abused: No    Forced sexual activity: No  Other Topics Concern  . Not on file  Social History Narrative   1 daughter died in 2016/10/03    Lives at home not alone     Family History  Problem Relation Age of Onset  . Arthritis Mother   . Mental illness Mother        depression  . Diabetes Mother   . Cancer Mother        pancreatic  . Cancer Father        colon  . Heart disease Father   . Diabetes Father   . Cancer Daughter        lung    No Known Allergies   Review of Systems   Review of Systems: Negative Unless Checked Constitutional: [] Weight loss  [] Fever  [] Chills Cardiac: [] Chest pain   []  Atrial Fibrillation  [] Palpitations   [] Shortness of breath when laying flat   [] Shortness of breath with exertion. [] Shortness of breath at rest Vascular:  [] Pain in legs with walking   [] Pain in legs with standing [] Pain in legs when laying flat   [] Claudication    [] Pain in feet when laying flat    [] History of DVT   [] Phlebitis   [x] Swelling in legs   [] Varicose veins   [] Non-healing ulcers Pulmonary:   [] Uses home oxygen   [] Productive cough   [] Hemoptysis   [] Wheeze  [] COPD   [] Asthma Neurologic:  [] Dizziness   [] Seizures  [] Blackouts [] History of stroke   [] History of TIA  [] Aphasia   [] Temporary Blindness   [] Weakness or numbness in arm   [] Weakness or numbness in leg Musculoskeletal:   [] Joint swelling   [] Joint pain   [] Low back pain  []  History of Knee Replacement [x] Arthritis [] back Surgeries  []  Spinal  Stenosis    Hematologic:  [] Easy bruising  [] Easy bleeding   [] Hypercoagulable state   [] Anemic Gastrointestinal:  [] Diarrhea   [] Vomiting  [] Gastroesophageal reflux/heartburn   [] Difficulty swallowing. [] Abdominal pain Genitourinary:  [] Chronic kidney disease   [] Difficult urination  [] Anuric   [] Blood in urine [] Frequent urination  [] Burning with urination   [] Hematuria Skin:  [] Rashes   [] Ulcers [] Wounds Psychological:  [x] History  of anxiety   [x]  History of major depression  []  Memory Difficulties      OBJECTIVE:   Physical Exam  BP (!) 94/57 (BP Location: Left Arm)   Pulse 76   Resp 16   Wt 138 lb (62.6 kg)   BMI 22.96 kg/m   Gen: WD/WN, NAD Head: [place]/AT, No temporalis wasting.  Ear/Nose/Throat: Hearing grossly intact, nares w/o erythema or drainage Eyes: PER, EOMI, sclera nonicteric.  Neck: Supple, no masses.  No JVD.  Pulmonary:  Good air movement, no use of accessory muscles.  Cardiac: RRR Vascular:  Vessel Right Left  Radial Palpable Palpable  Dorsalis Pedis Trace Palpable Trace Palpable  Posterior Tibial Trace Palpable Trace Palpable   Gastrointestinal: soft, non-distended. No guarding/no peritoneal signs.  Musculoskeletal: M/S 5/5 throughout.  No deformity or atrophy.  Neurologic: Pain and light touch intact in extremities.  Symmetrical.  Speech is fluent. Motor exam as listed above. Psychiatric: Judgment intact, Mood & affect appropriate for pt's clinical situation. Dermatologic: No Venous rashes. No Ulcers Noted.  No changes consistent with cellulitis. Lymph : No Cervical lymphadenopathy, no lichenification or skin changes of chronic lymphedema.       ASSESSMENT AND PLAN:  1. Atherosclerosis of artery of extremity with ulceration ([place]) Recommend:  The patient is status post successful angiogram with intervention.  The patient reports that the claudication symptoms and leg pain is essentially gone. The wound is continuing to heal.  The patient denies lifestyle  limiting changes at this point in time.  No further invasive studies, angiography or surgery at this time The patient should continue walking and begin a more formal exercise program.  The patient should continue antiplatelet therapy and aggressive treatment of the lipid abnormalities  Smoking cessation was again discussed  The patient should continue wearing graduated compression socks 10-15 mmHg strength to control the mild edema.  Patient should undergo noninvasive studies as ordered. The patient will follow up with me after the studies.   - VAS [place] ABI WITH/WO TBI; Future  2. Type 2 diabetes mellitus without complication, without long-term current use of insulin (HCC) Continue hypoglycemic medications as already ordered, these medications have been reviewed and there are no changes at this time.  Hgb A1C to be monitored as already arranged by primary service   3. Primary osteoarthritis involving multiple joints Continue NSAID medications as already ordered, these medications have been reviewed and there are no changes at this time.  Continued activity and therapy was stressed.    Current Outpatient Medications on File Prior to Visit  Medication Sig Dispense Refill  . acetaminophen (TYLENOL) 325 MG tablet Take 2 tablets (650 mg total) by mouth every 6 (six) hours as needed for mild pain (or Fever >/= 101).    . ARIPiprazole (ABILIFY) 2 MG tablet Take 1 tablet (2 mg total) by mouth at bedtime.    Marland Kitchen aspirin 81 MG tablet Take 81 mg by mouth daily.    Marland Kitchen atorvastatin (LIPITOR) 40 MG tablet Take 1 tablet (40 mg total) by mouth daily. 90 tablet 3  . cholecalciferol (VITAMIN D3) 25 MCG (1000 UT) tablet Take 1 tablet (1,000 Units total) by mouth daily.    . clonazePAM (KLONOPIN) 1 MG tablet Take 1 tablet (1 mg total) by mouth 2 (two) times daily. 30 tablet 0  . clopidogrel (PLAVIX) 75 MG tablet TAKE 1 TABLET BY MOUTH ONCE DAILY 30 tablet 11  . docusate sodium (COLACE) 100 MG capsule  Take 2 capsules (200 mg total)  by mouth daily as needed for mild constipation or moderate constipation. 10 capsule 0  . escitalopram (LEXAPRO) 5 MG tablet Take 1 tablet (5 mg total) by mouth daily.    . feeding supplement, GLUCERNA SHAKE, (GLUCERNA SHAKE) LIQD Take 237 mLs by mouth 2 (two) times daily between meals.  0  . ferrous sulfate 325 (65 FE) MG tablet Take 325 mg by mouth daily.    Marland Kitchen lisinopril (PRINIVIL,ZESTRIL) 10 MG tablet Take 1 tablet (10 mg total) by mouth daily. 90 tablet 3  . methadone (DOLOPHINE) 10 MG/ML solution Take 8 mLs (80 mg total) by mouth daily. 30 mL 0  . Multiple Vitamins-Iron (MULTIVITAMINS WITH IRON) TABS tablet Take 1 tablet by mouth daily.  0  . polyethylene glycol (MIRALAX / GLYCOLAX) 17 g packet Take 17 g by mouth daily as needed for severe constipation. 14 each 0  . vitamin C (VITAMIN C) 250 MG tablet Take 1 tablet (250 mg total) by mouth 2 (two) times daily.    . potassium chloride SA (K-DUR) 20 MEQ tablet Take 2 tablets (40 mEq total) by mouth daily. (Patient not taking: Reported on 12/09/2018)     Current Facility-Administered Medications on File Prior to Visit  Medication Dose Route Frequency Provider Last Rate Last Dose  . ceFAZolin (ANCEF) IVPB 2g/100 mL premix  2 g Intravenous Once Kris Hartmann, NP        There are no Patient Instructions on file for this visit. No follow-ups on file.   Kris Hartmann, NP  This note was completed with Sales executive.  Any errors are purely unintentional.

## 2018-12-15 DIAGNOSIS — I10 Essential (primary) hypertension: Secondary | ICD-10-CM | POA: Diagnosis not present

## 2018-12-15 DIAGNOSIS — I739 Peripheral vascular disease, unspecified: Secondary | ICD-10-CM | POA: Diagnosis not present

## 2018-12-15 DIAGNOSIS — I70209 Unspecified atherosclerosis of native arteries of extremities, unspecified extremity: Secondary | ICD-10-CM | POA: Diagnosis not present

## 2018-12-15 DIAGNOSIS — F418 Other specified anxiety disorders: Secondary | ICD-10-CM | POA: Diagnosis not present

## 2018-12-15 DIAGNOSIS — G8929 Other chronic pain: Secondary | ICD-10-CM | POA: Diagnosis not present

## 2018-12-22 ENCOUNTER — Inpatient Hospital Stay
Admission: EM | Admit: 2018-12-22 | Discharge: 2018-12-26 | DRG: 871 | Disposition: A | Discharge status: Skilled Nursing Facility | Payer: Medicare HMO | Attending: Internal Medicine | Admitting: Internal Medicine

## 2018-12-22 ENCOUNTER — Other Ambulatory Visit: Payer: Self-pay

## 2018-12-22 ENCOUNTER — Emergency Department: Payer: Medicare HMO

## 2018-12-22 DIAGNOSIS — W19XXXA Unspecified fall, initial encounter: Secondary | ICD-10-CM | POA: Diagnosis not present

## 2018-12-22 DIAGNOSIS — R52 Pain, unspecified: Secondary | ICD-10-CM | POA: Diagnosis not present

## 2018-12-22 DIAGNOSIS — N390 Urinary tract infection, site not specified: Secondary | ICD-10-CM | POA: Diagnosis present

## 2018-12-22 DIAGNOSIS — L89154 Pressure ulcer of sacral region, stage 4: Secondary | ICD-10-CM | POA: Diagnosis not present

## 2018-12-22 DIAGNOSIS — Z7189 Other specified counseling: Secondary | ICD-10-CM | POA: Diagnosis not present

## 2018-12-22 DIAGNOSIS — S0990XA Unspecified injury of head, initial encounter: Secondary | ICD-10-CM | POA: Diagnosis not present

## 2018-12-22 DIAGNOSIS — G9341 Metabolic encephalopathy: Secondary | ICD-10-CM | POA: Diagnosis present

## 2018-12-22 DIAGNOSIS — Z801 Family history of malignant neoplasm of trachea, bronchus and lung: Secondary | ICD-10-CM

## 2018-12-22 DIAGNOSIS — M6281 Muscle weakness (generalized): Secondary | ICD-10-CM | POA: Diagnosis not present

## 2018-12-22 DIAGNOSIS — Z7902 Long term (current) use of antithrombotics/antiplatelets: Secondary | ICD-10-CM

## 2018-12-22 DIAGNOSIS — F419 Anxiety disorder, unspecified: Secondary | ICD-10-CM | POA: Diagnosis present

## 2018-12-22 DIAGNOSIS — I10 Essential (primary) hypertension: Secondary | ICD-10-CM | POA: Diagnosis present

## 2018-12-22 DIAGNOSIS — Z7401 Bed confinement status: Secondary | ICD-10-CM | POA: Diagnosis not present

## 2018-12-22 DIAGNOSIS — R636 Underweight: Secondary | ICD-10-CM | POA: Diagnosis present

## 2018-12-22 DIAGNOSIS — Z87891 Personal history of nicotine dependence: Secondary | ICD-10-CM | POA: Diagnosis not present

## 2018-12-22 DIAGNOSIS — Y92009 Unspecified place in unspecified non-institutional (private) residence as the place of occurrence of the external cause: Secondary | ICD-10-CM

## 2018-12-22 DIAGNOSIS — Z20828 Contact with and (suspected) exposure to other viral communicable diseases: Secondary | ICD-10-CM | POA: Diagnosis not present

## 2018-12-22 DIAGNOSIS — E876 Hypokalemia: Secondary | ICD-10-CM | POA: Diagnosis present

## 2018-12-22 DIAGNOSIS — M2550 Pain in unspecified joint: Secondary | ICD-10-CM | POA: Diagnosis not present

## 2018-12-22 DIAGNOSIS — L89156 Pressure-induced deep tissue damage of sacral region: Secondary | ICD-10-CM | POA: Diagnosis present

## 2018-12-22 DIAGNOSIS — K7460 Unspecified cirrhosis of liver: Secondary | ICD-10-CM | POA: Diagnosis present

## 2018-12-22 DIAGNOSIS — Z03818 Encounter for observation for suspected exposure to other biological agents ruled out: Secondary | ICD-10-CM | POA: Diagnosis not present

## 2018-12-22 DIAGNOSIS — S199XXA Unspecified injury of neck, initial encounter: Secondary | ICD-10-CM | POA: Diagnosis not present

## 2018-12-22 DIAGNOSIS — Z515 Encounter for palliative care: Secondary | ICD-10-CM | POA: Diagnosis not present

## 2018-12-22 DIAGNOSIS — Z89422 Acquired absence of other left toe(s): Secondary | ICD-10-CM | POA: Diagnosis not present

## 2018-12-22 DIAGNOSIS — R748 Abnormal levels of other serum enzymes: Secondary | ICD-10-CM | POA: Diagnosis not present

## 2018-12-22 DIAGNOSIS — F410 Panic disorder [episodic paroxysmal anxiety] without agoraphobia: Secondary | ICD-10-CM | POA: Diagnosis present

## 2018-12-22 DIAGNOSIS — E1169 Type 2 diabetes mellitus with other specified complication: Secondary | ICD-10-CM

## 2018-12-22 DIAGNOSIS — R262 Difficulty in walking, not elsewhere classified: Secondary | ICD-10-CM | POA: Diagnosis not present

## 2018-12-22 DIAGNOSIS — G8929 Other chronic pain: Secondary | ICD-10-CM | POA: Diagnosis present

## 2018-12-22 DIAGNOSIS — R0902 Hypoxemia: Secondary | ICD-10-CM | POA: Diagnosis not present

## 2018-12-22 DIAGNOSIS — L89610 Pressure ulcer of right heel, unstageable: Secondary | ICD-10-CM | POA: Diagnosis not present

## 2018-12-22 DIAGNOSIS — Z6820 Body mass index (BMI) 20.0-20.9, adult: Secondary | ICD-10-CM | POA: Diagnosis not present

## 2018-12-22 DIAGNOSIS — Z9049 Acquired absence of other specified parts of digestive tract: Secondary | ICD-10-CM

## 2018-12-22 DIAGNOSIS — Z818 Family history of other mental and behavioral disorders: Secondary | ICD-10-CM

## 2018-12-22 DIAGNOSIS — Z90710 Acquired absence of both cervix and uterus: Secondary | ICD-10-CM

## 2018-12-22 DIAGNOSIS — E1122 Type 2 diabetes mellitus with diabetic chronic kidney disease: Secondary | ICD-10-CM | POA: Diagnosis not present

## 2018-12-22 DIAGNOSIS — L8990 Pressure ulcer of unspecified site, unspecified stage: Secondary | ICD-10-CM | POA: Insufficient documentation

## 2018-12-22 DIAGNOSIS — F329 Major depressive disorder, single episode, unspecified: Secondary | ICD-10-CM | POA: Diagnosis present

## 2018-12-22 DIAGNOSIS — R41841 Cognitive communication deficit: Secondary | ICD-10-CM | POA: Diagnosis not present

## 2018-12-22 DIAGNOSIS — R404 Transient alteration of awareness: Secondary | ICD-10-CM | POA: Diagnosis not present

## 2018-12-22 DIAGNOSIS — L89612 Pressure ulcer of right heel, stage 2: Secondary | ICD-10-CM | POA: Diagnosis present

## 2018-12-22 DIAGNOSIS — I129 Hypertensive chronic kidney disease with stage 1 through stage 4 chronic kidney disease, or unspecified chronic kidney disease: Secondary | ICD-10-CM | POA: Diagnosis present

## 2018-12-22 DIAGNOSIS — E1151 Type 2 diabetes mellitus with diabetic peripheral angiopathy without gangrene: Secondary | ICD-10-CM | POA: Diagnosis not present

## 2018-12-22 DIAGNOSIS — F32A Depression, unspecified: Secondary | ICD-10-CM | POA: Diagnosis present

## 2018-12-22 DIAGNOSIS — A419 Sepsis, unspecified organism: Secondary | ICD-10-CM | POA: Diagnosis not present

## 2018-12-22 DIAGNOSIS — Z79899 Other long term (current) drug therapy: Secondary | ICD-10-CM | POA: Diagnosis not present

## 2018-12-22 DIAGNOSIS — L89150 Pressure ulcer of sacral region, unstageable: Secondary | ICD-10-CM | POA: Diagnosis not present

## 2018-12-22 DIAGNOSIS — Z8261 Family history of arthritis: Secondary | ICD-10-CM

## 2018-12-22 DIAGNOSIS — E119 Type 2 diabetes mellitus without complications: Secondary | ICD-10-CM | POA: Diagnosis not present

## 2018-12-22 DIAGNOSIS — L89120 Pressure ulcer of left upper back, unstageable: Secondary | ICD-10-CM | POA: Diagnosis not present

## 2018-12-22 DIAGNOSIS — N184 Chronic kidney disease, stage 4 (severe): Secondary | ICD-10-CM | POA: Diagnosis not present

## 2018-12-22 DIAGNOSIS — E785 Hyperlipidemia, unspecified: Secondary | ICD-10-CM | POA: Diagnosis present

## 2018-12-22 DIAGNOSIS — R000 Tachycardia, unspecified: Secondary | ICD-10-CM | POA: Diagnosis not present

## 2018-12-22 DIAGNOSIS — R1314 Dysphagia, pharyngoesophageal phase: Secondary | ICD-10-CM | POA: Diagnosis not present

## 2018-12-22 DIAGNOSIS — R4182 Altered mental status, unspecified: Secondary | ICD-10-CM

## 2018-12-22 DIAGNOSIS — Z833 Family history of diabetes mellitus: Secondary | ICD-10-CM

## 2018-12-22 DIAGNOSIS — Z800 Family history of malignant neoplasm of digestive organs: Secondary | ICD-10-CM

## 2018-12-22 DIAGNOSIS — Z7982 Long term (current) use of aspirin: Secondary | ICD-10-CM

## 2018-12-22 DIAGNOSIS — Z8249 Family history of ischemic heart disease and other diseases of the circulatory system: Secondary | ICD-10-CM

## 2018-12-22 LAB — CBC
HCT: 27.4 % — ABNORMAL LOW (ref 36.0–46.0)
Hemoglobin: 8.7 g/dL — ABNORMAL LOW (ref 12.0–15.0)
MCH: 27.6 pg (ref 26.0–34.0)
MCHC: 31.8 g/dL (ref 30.0–36.0)
MCV: 87 fL (ref 80.0–100.0)
Platelets: 530 10*3/uL — ABNORMAL HIGH (ref 150–400)
RBC: 3.15 MIL/uL — ABNORMAL LOW (ref 3.87–5.11)
RDW: 15.1 % (ref 11.5–15.5)
WBC: 17.3 10*3/uL — ABNORMAL HIGH (ref 4.0–10.5)
nRBC: 0 % (ref 0.0–0.2)

## 2018-12-22 LAB — URINALYSIS, COMPLETE (UACMP) WITH MICROSCOPIC
Bilirubin Urine: NEGATIVE
Glucose, UA: NEGATIVE mg/dL
Ketones, ur: 5 mg/dL — AB
Nitrite: NEGATIVE
Protein, ur: 30 mg/dL — AB
Specific Gravity, Urine: 1.012 (ref 1.005–1.030)
Squamous Epithelial / HPF: NONE SEEN (ref 0–5)
WBC, UA: >50 WBC/hpf — ABNORMAL HIGH (ref 0–5)
pH: 5 (ref 5.0–8.0)

## 2018-12-22 LAB — CBC WITH DIFFERENTIAL/PLATELET
Abs Immature Granulocytes: 0.27 10*3/uL — ABNORMAL HIGH (ref 0.00–0.07)
Basophils Absolute: 0 10*3/uL (ref 0.0–0.1)
Basophils Relative: 0 %
Eosinophils Absolute: 0.1 10*3/uL (ref 0.0–0.5)
Eosinophils Relative: 0 %
HCT: 29 % — ABNORMAL LOW (ref 36.0–46.0)
Hemoglobin: 9.1 g/dL — ABNORMAL LOW (ref 12.0–15.0)
Immature Granulocytes: 2 %
Lymphocytes Relative: 7 %
Lymphs Abs: 1.3 10*3/uL (ref 0.7–4.0)
MCH: 27.4 pg (ref 26.0–34.0)
MCHC: 31.4 g/dL (ref 30.0–36.0)
MCV: 87.3 fL (ref 80.0–100.0)
Monocytes Absolute: 1.1 10*3/uL — ABNORMAL HIGH (ref 0.1–1.0)
Monocytes Relative: 6 %
Neutro Abs: 15.6 10*3/uL — ABNORMAL HIGH (ref 1.7–7.7)
Neutrophils Relative %: 85 %
Platelets: 569 10*3/uL — ABNORMAL HIGH (ref 150–400)
RBC: 3.32 MIL/uL — ABNORMAL LOW (ref 3.87–5.11)
RDW: 15.2 % (ref 11.5–15.5)
WBC: 18.4 10*3/uL — ABNORMAL HIGH (ref 4.0–10.5)
nRBC: 0 % (ref 0.0–0.2)

## 2018-12-22 LAB — CREATININE, SERUM
Creatinine, Ser: 1.92 mg/dL — ABNORMAL HIGH (ref 0.44–1.00)
GFR calc Af Amer: 30 mL/min — ABNORMAL LOW (ref >60)
GFR calc non Af Amer: 26 mL/min — ABNORMAL LOW (ref >60)

## 2018-12-22 LAB — URINE DRUG SCREEN, QUALITATIVE (ARMC ONLY)
Amphetamines, Ur Screen: NOT DETECTED
Barbiturates, Ur Screen: NOT DETECTED
Benzodiazepine, Ur Scrn: NOT DETECTED
Cannabinoid 50 Ng, Ur ~~LOC~~: NOT DETECTED
Cocaine Metabolite,Ur ~~LOC~~: NOT DETECTED
MDMA (Ecstasy)Ur Screen: NOT DETECTED
Methadone Scn, Ur: POSITIVE — AB
Opiate, Ur Screen: NOT DETECTED
Phencyclidine (PCP) Ur S: NOT DETECTED
Tricyclic, Ur Screen: NOT DETECTED

## 2018-12-22 LAB — COMPREHENSIVE METABOLIC PANEL
ALT: 17 U/L (ref 0–44)
AST: 32 U/L (ref 15–41)
Albumin: 2.9 g/dL — ABNORMAL LOW (ref 3.5–5.0)
Alkaline Phosphatase: 68 U/L (ref 38–126)
Anion gap: 14 (ref 5–15)
BUN: 60 mg/dL — ABNORMAL HIGH (ref 8–23)
CO2: 20 mmol/L — ABNORMAL LOW (ref 22–32)
Calcium: 8.9 mg/dL (ref 8.9–10.3)
Chloride: 107 mmol/L (ref 98–111)
Creatinine, Ser: 2.15 mg/dL — ABNORMAL HIGH (ref 0.44–1.00)
GFR calc Af Amer: 27 mL/min — ABNORMAL LOW (ref >60)
GFR calc non Af Amer: 23 mL/min — ABNORMAL LOW (ref >60)
Glucose, Bld: 145 mg/dL — ABNORMAL HIGH (ref 70–99)
Potassium: 3.5 mmol/L (ref 3.5–5.1)
Sodium: 141 mmol/L (ref 135–145)
Total Bilirubin: 0.5 mg/dL (ref 0.3–1.2)
Total Protein: 7 g/dL (ref 6.5–8.1)

## 2018-12-22 LAB — CK: Total CK: 751 U/L — ABNORMAL HIGH (ref 38–234)

## 2018-12-22 LAB — LACTIC ACID, PLASMA: Lactic Acid, Venous: 1.6 mmol/L (ref 0.5–1.9)

## 2018-12-22 LAB — TROPONIN I: Troponin I: 0.05 ng/mL (ref <0.03)

## 2018-12-22 MED ORDER — ONDANSETRON HCL 4 MG/2ML IJ SOLN
4.0000 mg | Freq: Four times a day (QID) | INTRAMUSCULAR | Status: DC | PRN
  Administered 2018-12-24: 16:00:00 4 mg via INTRAVENOUS
  Filled 2018-12-22: qty 2

## 2018-12-22 MED ORDER — CLOPIDOGREL BISULFATE 75 MG PO TABS
75.0000 mg | ORAL_TABLET | Freq: Every day | ORAL | Status: DC
  Administered 2018-12-24 – 2018-12-26 (×3): 75 mg via ORAL
  Filled 2018-12-22 (×5): qty 1

## 2018-12-22 MED ORDER — SODIUM CHLORIDE 0.9 % IV SOLN
1.0000 g | Freq: Once | INTRAVENOUS | Status: AC
  Administered 2018-12-22: 1 g via INTRAVENOUS
  Filled 2018-12-22: qty 10

## 2018-12-22 MED ORDER — INSULIN ASPART 100 UNIT/ML ~~LOC~~ SOLN
0.0000 [IU] | Freq: Three times a day (TID) | SUBCUTANEOUS | Status: DC
  Administered 2018-12-23: 1 [IU] via SUBCUTANEOUS
  Administered 2018-12-23: 2 [IU] via SUBCUTANEOUS
  Administered 2018-12-23 – 2018-12-24 (×3): 1 [IU] via SUBCUTANEOUS
  Administered 2018-12-25: 2 [IU] via SUBCUTANEOUS
  Administered 2018-12-26: 1 [IU] via SUBCUTANEOUS
  Administered 2018-12-26: 2 [IU] via SUBCUTANEOUS
  Filled 2018-12-22 (×8): qty 1

## 2018-12-22 MED ORDER — HEPARIN SODIUM (PORCINE) 5000 UNIT/ML IJ SOLN
5000.0000 [IU] | Freq: Three times a day (TID) | INTRAMUSCULAR | Status: DC
  Administered 2018-12-23 – 2018-12-26 (×10): 5000 [IU] via SUBCUTANEOUS
  Filled 2018-12-22 (×11): qty 1

## 2018-12-22 MED ORDER — ACETAMINOPHEN 650 MG RE SUPP: 650.0000 mg | Freq: Four times a day (QID) | RECTAL | Status: DC | PRN

## 2018-12-22 MED ORDER — VANCOMYCIN HCL 1.25 G IV SOLR
1250.0000 mg | Freq: Once | INTRAVENOUS | Status: AC
  Administered 2018-12-22: 23:00:00 1250 mg via INTRAVENOUS
  Filled 2018-12-22: qty 1250

## 2018-12-22 MED ORDER — ASPIRIN EC 81 MG PO TBEC
81.0000 mg | DELAYED_RELEASE_TABLET | Freq: Every day | ORAL | Status: DC
  Administered 2018-12-23 – 2018-12-26 (×3): 81 mg via ORAL
  Filled 2018-12-22 (×4): qty 1

## 2018-12-22 MED ORDER — ATORVASTATIN CALCIUM 20 MG PO TABS: 40.0000 mg | ORAL_TABLET | Freq: Every day | ORAL | Status: DC

## 2018-12-22 MED ORDER — ACETAMINOPHEN 325 MG PO TABS: 650.0000 mg | ORAL_TABLET | Freq: Four times a day (QID) | ORAL | Status: DC | PRN

## 2018-12-22 MED ORDER — ONDANSETRON HCL 4 MG PO TABS: 4.0000 mg | ORAL_TABLET | Freq: Four times a day (QID) | ORAL | Status: DC | PRN

## 2018-12-22 NOTE — ED Notes (Addendum)
Pt arrived to ed soiled with feces and unable to communicate clearly. Pt responds appropriately when she is able to, responses are delayed when responding. Pt shaking and leaning to the left. When attempting to clean soiled clothing off pt, a pressure ulcer was found on the sacral region measuring around 5" x 3" in diameter, with redness and skin breakdown extending forward bilaterally into the groin region. Left labia significantly swollen in comparison to the right. Abdomen distended and firm with palpation. When obtaining urine sample 2300 mL was drained from the bladder before urine flow began to slow down. Pt had boot on the left foot on arrival with wound dressing in place dated 6/9. Bandage moist and soiled with a foul smell. Large blisters estimated around 3-4" in diameter. Large blister noted on the top of the right foot as well. Pt had on yellow hospital sock on the rt foot on arrival, moist to the touch, when attempting to remove the sock it was stuck to the patient skin and difficult to remove.   Upon arrival to the ED this RN called pt contact Iona Beard. He reported the pt had fallen out of a chair on sat afternoon, and had been in the floor since. He stated he was disabled and unable to get her up out of the floor by himself. However he also stated they had lived together for 18 years, and he was the primary caretaker. He states that " I have a friend here now that is coming to pick her up right now from the hospital and take her to her methadone appointment tomorrow" he stated "she has a methadone appointment tomorrow that she can not miss or they will stop her medications and stop seeing her". He states she has been getting methadone from this clinic for 2 year, then later stated that she had been taking methadone for 13 years and could not stop taking it or she would go into withdraw.  When george was informed of the patient condition and that the pt had to be medically cleared before discharge he  became very argumentative. He stated " I dont care what you say I am coming to get her right now" " she is not missing her appointment".   Charge RN and Dr. Archie Balboa both spoke to [place] multiple times and he continued to be argumentative.   Iona Beard was informed that only the pt POA could withdraw the patient from hospital care at this time, and the patient needed to be medically cleared before being allowed to leave.   Also when on the phone with Iona Beard he made several comments about the patient receiving care at [facility], or [facility] 2 months ago. He reported that he had withdrawn the patient from the facility to care for the patient himself. He states " we couldn't even have a private conversation when she was there, there was always someone standing right over her shoulder, watching and listening to everything we said"   Pt sister called to discuss pt care when this RN was in the middle of direct pt care. This RN informed sister that we would return her call. Sister stated that we needed to speak to someone other than Iona Beard about the pt care. Pt gave verbal permission to discuss pt care with her sister. Phone number was displayed at the time of the call, but when attempting to contact the sister after pt care this RN unable to find a phone number to contact pt sister.

## 2018-12-22 NOTE — ED Notes (Signed)
[facility] called at this time

## 2018-12-22 NOTE — ED Notes (Signed)
Patient transported to CT 

## 2018-12-22 NOTE — H&P (Signed)
[facility] NAME: Yesenia Shepard    MR#:  [id number]  DATE OF BIRTH:  1951-02-19  DATE OF ADMISSION:  12/22/2018  PRIMARY CARE PHYSICIAN: [name], [name], MD   REQUESTING/REFERRING PHYSICIAN: Archie Balboa, MD  CHIEF COMPLAINT:   Chief Complaint  Patient presents with  . Fall  . Altered Mental Status    HISTORY OF PRESENT ILLNESS:  Yesenia Shepard  is a 68 y.o. female who presents with chief complaint as above.  Patient presents the ED after a fall at home.  She is significantly confused and does not contribute any information to her HPI.  On evaluation here she is found to meet sepsis criteria and has a UTI.  She also has a sacral ulcer as a possible source of infection.  Hospitalist were called for admission  PAST MEDICAL HISTORY:   Past Medical History:  Diagnosis Date  . Anxiety   . Arthritis   . Chronic pain   . Depression   . Diabetes mellitus without complication ([place])   . Drug abuse ([place])    History of polysubstance abuse; currently on methadone.  . Hepatitis    History of Hep "C". treated and cured with Harvoni  . History of chicken pox   . Hypertension   . Panic attacks   . Peripheral vascular disease ([place])    stent in place.   Marland Kitchen UTI (urinary tract infection)    History of     PAST SURGICAL HISTORY:   Past Surgical History:  Procedure Laterality Date  . ABDOMINAL HYSTERECTOMY  1989  . AMPUTATION TOE Left 10/26/2018   Procedure: 5TH RAY RESCTION;  Surgeon: Albertine Patricia, DPM;  Location: [facility];  Service: Podiatry;  Laterality: Left;  . CHOLECYSTECTOMY  1987  . INTRACAPSULAR CATARACT EXTRACTION Left   . LOWER EXTREMITY ANGIOGRAPHY Left 12/17/2017   Procedure: LOWER EXTREMITY ANGIOGRAPHY;  Surgeon: Katha Cabal, MD;  Location: [place] [facility];  Service: Cardiovascular;  Laterality: Left;  . LOWER EXTREMITY ANGIOGRAPHY Left 10/24/2018   Procedure: LOWER EXTREMITY ANGIOGRAPHY;  Surgeon:  Algernon Huxley, MD;  Location: [place] [facility];  Service: Cardiovascular;  Laterality: Left;  . PERIPHERAL VASCULAR CATHETERIZATION Left 05/04/2015   Procedure: Lower Extremity Angiography;  Surgeon: Katha Cabal, MD;  Location: [place] [facility];  Service: Cardiovascular;  Laterality: Left;  . PERIPHERAL VASCULAR CATHETERIZATION Left 05/04/2015   Procedure: Lower Extremity Intervention;  Surgeon: Katha Cabal, MD;  Location: [place] [facility];  Service: Cardiovascular;  Laterality: Left;  . SHOULDER ARTHROSCOPY WITH OPEN ROTATOR CUFF REPAIR Right 01/16/2017   Procedure: SHOULDER ARTHROSCOPY WITH OPEN ROTATOR CUFF REPAIR;  Surgeon: Thornton Park, MD;  Location: [facility];  Service: Orthopedics;  Laterality: Right;  . TONSILLECTOMY AND ADENOIDECTOMY  1971     SOCIAL HISTORY:   Social History   Tobacco Use  . Smoking status: Former Smoker    Packs/day: 0.50    Years: 30.00    Pack years: 15.00    Types: Cigarettes    Quit date: 07/25/2018    Years since quitting: 0.4  . Smokeless tobacco: Never Used  . Tobacco comment: Currently smoking 1/2 ppd  Substance Use Topics  . Alcohol use: No    Alcohol/week: 0.0 standard drinks     FAMILY HISTORY:   Family History  Problem Relation Age of Onset  . Arthritis Mother   . Mental illness Mother  depression  . Diabetes Mother   . Cancer Mother        pancreatic  . Cancer Father        colon  . Heart disease Father   . Diabetes Father   . Cancer Daughter        lung     DRUG ALLERGIES:  No Known Allergies  MEDICATIONS AT HOME:   Prior to Admission medications   Medication Sig Start Date End Date Taking? Authorizing Provider  acetaminophen (TYLENOL) 325 MG tablet Take 2 tablets (650 mg total) by mouth every 6 (six) hours as needed for mild pain (or Fever >/= 101). 11/03/18   Stegmayer, Joelene Millin A, PA-C  ARIPiprazole (ABILIFY) 2 MG tablet Take 1 tablet (2 mg total) by mouth at bedtime. 11/03/18    Stegmayer, Janalyn Harder, PA-C  aspirin 81 MG tablet Take 81 mg by mouth daily.    [provider]  atorvastatin (LIPITOR) 40 MG tablet Take 1 tablet (40 mg total) by mouth daily. 04/10/18   [name], [name], MD  cholecalciferol (VITAMIN D3) 25 MCG (1000 UT) tablet Take 1 tablet (1,000 Units total) by mouth daily. 11/04/18   Stegmayer, Joelene Millin A, PA-C  clonazePAM (KLONOPIN) 1 MG tablet Take 1 tablet (1 mg total) by mouth 2 (two) times daily. 11/04/18   Stegmayer, Joelene Millin A, PA-C  clopidogrel (PLAVIX) 75 MG tablet TAKE 1 TABLET BY MOUTH ONCE DAILY 12/23/17   Schnier, Dolores Lory, MD  docusate sodium (COLACE) 100 MG capsule Take 2 capsules (200 mg total) by mouth daily as needed for mild constipation or moderate constipation. 11/03/18   Stegmayer, Joelene Millin A, PA-C  escitalopram (LEXAPRO) 5 MG tablet Take 1 tablet (5 mg total) by mouth daily. 11/04/18   Stegmayer, Joelene Millin A, PA-C  feeding supplement, GLUCERNA SHAKE, (GLUCERNA SHAKE) LIQD Take 237 mLs by mouth 2 (two) times daily between meals. 11/03/18   Stegmayer, Joelene Millin A, PA-C  ferrous sulfate 325 (65 FE) MG tablet Take 325 mg by mouth daily.    [provider]  lisinopril (PRINIVIL,ZESTRIL) 10 MG tablet Take 1 tablet (10 mg total) by mouth daily. 04/23/18   [name], [name], MD  methadone (DOLOPHINE) 10 MG/ML solution Take 8 mLs (80 mg total) by mouth daily. 11/04/18   Stegmayer, Janalyn Harder, PA-C  Multiple Vitamins-Iron (MULTIVITAMINS WITH IRON) TABS tablet Take 1 tablet by mouth daily. 11/04/18   Stegmayer, Joelene Millin A, PA-C  polyethylene glycol (MIRALAX / GLYCOLAX) 17 g packet Take 17 g by mouth daily as needed for severe constipation. 11/03/18   Stegmayer, Joelene Millin A, PA-C  potassium chloride SA (K-DUR) 20 MEQ tablet Take 2 tablets (40 mEq total) by mouth daily. Patient not taking: Reported on 12/09/2018 11/04/18   Stegmayer, Joelene Millin A, PA-C  vitamin C (VITAMIN C) 250 MG tablet Take 1 tablet (250 mg total) by mouth 2 (two)  times daily. 11/03/18   Stegmayer, Janalyn Harder, PA-C    REVIEW OF SYSTEMS:  Review of Systems  Unable to perform ROS: Acuity of condition     VITAL SIGNS:   Vitals:   12/22/18 1809 12/22/18 1820 12/22/18 2000  BP: (!) 175/95  129/66  Pulse: 100  93  Resp: 20  (!) 22  Temp: (!) 97.5 F (36.4 C)    TempSrc: Axillary    SpO2: 99%  100%  Weight:  66.8 kg   Height:  5' (1.524 m)    Wt Readings from Last 3 Encounters:  12/22/18 66.8 kg  12/09/18 62.6 kg  11/06/18 64.4 kg    PHYSICAL EXAMINATION:  Physical Exam  Vitals reviewed. Constitutional: She appears well-developed and well-nourished. No distress.  HENT:  Head: Normocephalic and atraumatic.  Mouth/Throat: Oropharynx is clear and moist.  Eyes: Pupils are equal, round, and reactive to light. Conjunctivae and EOM are normal. No scleral icterus.  Neck: Normal range of motion. Neck supple. No JVD present. No thyromegaly present.  Cardiovascular: Normal rate, regular rhythm and intact distal pulses. Exam reveals no gallop and no friction rub.  No murmur heard. Respiratory: Effort normal and breath sounds normal. No respiratory distress. She has no wheezes. She has no rales.  GI: Soft. Bowel sounds are normal. She exhibits no distension. There is no abdominal tenderness.  Musculoskeletal: Normal range of motion.        General: No edema.     Comments: No arthritis, no gout  Lymphadenopathy:    She has no cervical adenopathy.  Neurological: She is alert.  Unable to fully assess due to patient condition  Skin: Skin is warm and dry. No rash noted. No erythema.  Psychiatric:  Unable to assess due to patient condition    LABORATORY PANEL:   CBC Recent Labs  Lab 12/22/18 1813  WBC 18.4*  HGB 9.1*  HCT 29.0*  PLT 569*   ------------------------------------------------------------------------------------------------------------------  Chemistries  Recent Labs  Lab 12/22/18 1813  NA 141  K 3.5  CL 107  CO2 20*   GLUCOSE 145*  BUN 60*  CREATININE 2.15*  CALCIUM 8.9  AST 32  ALT 17  ALKPHOS 68  BILITOT 0.5   ------------------------------------------------------------------------------------------------------------------  Cardiac Enzymes Recent Labs  Lab 12/22/18 1813  TROPONINI 0.05*   ------------------------------------------------------------------------------------------------------------------  RADIOLOGY:  Ct Head Wo Contrast  Result Date: 12/22/2018 CLINICAL DATA:  Fall.  Altered mental status. EXAM: CT HEAD WITHOUT CONTRAST CT CERVICAL SPINE WITHOUT CONTRAST TECHNIQUE: Multidetector CT imaging of the head and cervical spine was performed following the standard protocol without intravenous contrast. Multiplanar CT image reconstructions of the cervical spine were also generated. COMPARISON:  None. FINDINGS: CT HEAD FINDINGS Brain: Mild cerebral atrophy without hydrocephalus. Negative for acute infarct, hemorrhage, or mass. Vascular: Negative for hyperdense vessel Skull: Negative Sinuses/Orbits: Negative Other: None CT CERVICAL SPINE FINDINGS Alignment: Mild anterolisthesis C4-5. Skull base and vertebrae: Negative for fracture Soft tissues and spinal canal: Negative Disc levels: Multilevel disc and facet degeneration throughout the cervical spine. Disc degeneration most prominent C5-6 and C6-7 Upper chest: Apically scarring bilaterally Other: None IMPRESSION: No acute intracranial abnormality Negative for cervical spine fracture. Electronically Signed   By: Franchot Gallo M.D.   On: 12/22/2018 19:51   Ct Cervical Spine Wo Contrast  Result Date: 12/22/2018 CLINICAL DATA:  Fall.  Altered mental status. EXAM: CT HEAD WITHOUT CONTRAST CT CERVICAL SPINE WITHOUT CONTRAST TECHNIQUE: Multidetector CT imaging of the head and cervical spine was performed following the standard protocol without intravenous contrast. Multiplanar CT image reconstructions of the cervical spine were also generated.  COMPARISON:  None. FINDINGS: CT HEAD FINDINGS Brain: Mild cerebral atrophy without hydrocephalus. Negative for acute infarct, hemorrhage, or mass. Vascular: Negative for hyperdense vessel Skull: Negative Sinuses/Orbits: Negative Other: None CT CERVICAL SPINE FINDINGS Alignment: Mild anterolisthesis C4-5. Skull base and vertebrae: Negative for fracture Soft tissues and spinal canal: Negative Disc levels: Multilevel disc and facet degeneration throughout the cervical spine. Disc degeneration most prominent C5-6 and C6-7 Upper chest: Apically scarring bilaterally Other: None IMPRESSION: No acute intracranial abnormality Negative for cervical spine fracture. Electronically Signed   By:  Franchot Gallo M.D.   On: 12/22/2018 19:51    EKG:   Orders placed or performed during the hospital encounter of 11/06/18  . EKG 12-Lead  . EKG 12-Lead    IMPRESSION AND PLAN:  Principal Problem:   Sepsis ([place]) -IV antibiotics initiated, lactic acid was limits, blood pressure stable, sepsis is due to UTI, blood cultures ordered Active Problems:   UTI (urinary tract infection) -IV antibiotics given, urine culture sent   Elevated CK -relatively mild elevation, IV fluids tonight, monitor for improvement   DM type 2 (diabetes mellitus, type 2) (HCC) -sliding scale insulin   Essential hypertension -home dose antihypertensives   Anxiety and depression -continue home meds   HLD (hyperlipidemia) -home dose antilipid   CKD (chronic kidney disease), stage IV (HCC) -at baseline, avoid nephrotoxins  Chart review performed and case discussed with ED provider. Labs, imaging and/or ECG reviewed by provider and discussed with patient/family. Management plans discussed with the patient and/or family.  COVID-19 status: Test pending  DVT PROPHYLAXIS: SubQ heparin  GI PROPHYLAXIS:  None  ADMISSION STATUS: Inpatient     CODE STATUS: Full Code Status History    Date Active Date Inactive Code Status Order ID Comments User  Context   10/24/2018 1504 11/04/2018 1901 Full Code [id number]  Algernon Huxley, MD Inpatient   12/17/2017 1504 12/17/2017 2020 Full Code [id number]  Katha Cabal, MD Inpatient   01/16/2017 1840 01/17/2017 2304 Full Code [id number]  Thornton Park, MD Inpatient   Advance Care Planning Activity      TOTAL TIME TAKING CARE OF THIS PATIENT: 45 minutes.   This patient was evaluated in the context of the global COVID-19 pandemic, which necessitated consideration that the patient might be at risk for infection with the SARS-CoV-2 virus that causes COVID-19. Institutional protocols and algorithms that pertain to the evaluation of patients at risk for COVID-19 are in a state of rapid change based on information released by regulatory bodies including the CDC and federal and state organizations. These policies and algorithms were followed to the best of this provider's knowledge to date during the patient's care at this facility.  Ethlyn Daniels 12/22/2018, 9:21 PM  [facility]  Office  [phone]  CC: Primary care physician; [name], [name], MD  Note:  This document was prepared using Dragon voice recognition software and may include unintentional dictation errors.

## 2018-12-22 NOTE — ED Notes (Addendum)
pts sister updated at this time. Karna Christmas [phone]

## 2018-12-22 NOTE — ED Triage Notes (Signed)
Pt arrives via ems from home. Ems states they are unsure if she lives alone. Ems states they were called out by [place] who received a call from a 3rd party stating pt had fallen Friday and been on floor since. Upon arrival pt alert and oriented to self. Pt soiled with feces, body shaking and leaning to the left. Unsure of pt baseline at this time.

## 2018-12-22 NOTE — ED Notes (Signed)
ED TO INPATIENT HANDOFF REPORT  ED Nurse Name and Phone #: Terri Piedra [phone]  S Name/Age/Gender Yesenia Shepard 68 y.o. female Room/Bed: ED05A/ED05A  Code Status   Code Status: Prior  Home/SNF/Other Home Patient oriented to: self Is this baseline? No   Triage Complete: Triage complete  Chief Complaint [facility] Fall  Triage Note Pt arrives via ems from home. Ems states they are unsure if she lives alone. Ems states they were called out by [place] who received a call from a 3rd party stating pt had fallen Friday and been on floor since. Upon arrival pt alert and oriented to self. Pt soiled with feces, body shaking and leaning to the left. Unsure of pt baseline at this time.    Allergies No Known Allergies  Level of Care/Admitting Diagnosis ED Disposition    ED Disposition Condition [facility] Area: [place] [[id number]]  Level of Care: Med-Surg [16]  Covid Evaluation: Screening Protocol (No Symptoms)  Diagnosis: Sepsis [facility]) [[id number]]  Admitting Physician: Lance Coon [[id number]]  Attending Physician: Lance Coon [phone]  Estimated length of stay: past midnight tomorrow  Certification:: I certify this patient will need inpatient services for at least 2 midnights  PT Class (Do Not Modify): Inpatient [101]  PT Acc Code (Do Not Modify): Private [1]       B Medical/Surgery History Past Medical History:  Diagnosis Date  . Anxiety   . Arthritis   . Chronic pain   . Depression   . Diabetes mellitus without complication ([place])   . Drug abuse ([place])    History of polysubstance abuse; currently on methadone.  . Hepatitis    History of Hep "C". treated and cured with Harvoni  . History of chicken pox   . Hypertension   . Panic attacks   . Peripheral vascular disease ([place])    stent in place.   Mar[place] Kitchen UTI (urinary tract infection)    History of   Past Surgical History:  Procedure Laterality Date  . ABDOMINAL HYSTERECTOMY  1989  . AMPUTATION  TOE Left 10/26/2018   Procedure: 5TH RAY RESCTION;  Surgeon: Albertine Patricia, DPM;  Location: [facility] ORS;  Service: Podiatry;  Laterality: Left;  . CHOLECYSTECTOMY  1987  . INTRACAPSULAR CATARACT EXTRACTION Left   . LOWER EXTREMITY ANGIOGRAPHY Left 12/17/2017   Procedure: LOWER EXTREMITY ANGIOGRAPHY;  Surgeon: Katha Cabal, MD;  Location: [place] [facility];  Service: Cardiovascular;  Laterality: Left;  . LOWER EXTREMITY ANGIOGRAPHY Left 10/24/2018   Procedure: LOWER EXTREMITY ANGIOGRAPHY;  Surgeon: Algernon Huxley, MD;  Location: [place] [facility];  Service: Cardiovascular;  Laterality: Left;  . PERIPHERAL VASCULAR CATHETERIZATION Left 05/04/2015   Procedure: Lower Extremity Angiography;  Surgeon: Katha Cabal, MD;  Location: [place] CV LAB;  Service: Cardiovascular;  Laterality: Left;  . PERIPHERAL VASCULAR CATHETERIZATION Left 05/04/2015   Procedure: Lower Extremity Intervention;  Surgeon: Katha Cabal, MD;  Location: [place] [facility] LAB;  Service: Cardiovascular;  Laterality: Left;  . SHOULDER ARTHROSCOPY WITH OPEN ROTATOR CUFF REPAIR Right 01/16/2017   Procedure: SHOULDER ARTHROSCOPY WITH OPEN ROTATOR CUFF REPAIR;  Surgeon: Thornton Park, MD;  Location: [facility] ORS;  Service: Orthopedics;  Laterality: Right;  . TONSILLECTOMY AND ADENOIDECTOMY  1971     A IV Location/Drains/Wounds Patient Lines/Drains/Airways Status   Active Line/Drains/Airways    Name:   Placement date:   Placement time:   Site:   Days:   Peripheral IV 12/22/18 Posterior;Right Forearm  12/22/18    1759    Forearm   less than 1   Urethral Catheter gracie rn Double-lumen 16 Fr.   12/22/18    2011    Double-lumen   less than 1   Incision (Closed) 10/26/18 Foot Left   10/26/18    0847     57          Intake/Output Last 24 hours  Intake/Output Summary (Last 24 hours) at 12/22/2018 2135 Last data filed at 12/22/2018 2046 Gross per 24 hour  Intake 97.63 ml  Output -  Net 97.63 ml     Labs/Imaging Results for orders placed or performed during the hospital encounter of 12/22/18 (from the past 48 hour(s))  Lactic acid, plasma     Status: None   Collection Time: 12/22/18  6:13 PM  Result Value Ref Range   Lactic Acid, Venous 1.6 0.5 - 1.9 mmol/L    Comment: Performed at [facility], [address] [place], [place] [address]  CBC with Differential     Status: Abnormal   Collection Time: 12/22/18  6:13 PM  Result Value Ref Range   WBC 18.4 (H) 4.0 - 10.5 K/uL   RBC 3.32 (L) 3.87 - 5.11 MIL/uL   Hemoglobin 9.1 (L) 12.0 - 15.0 g/dL   HCT 29.0 (L) 36.0 - 46.0 %   MCV 87.3 80.0 - 100.0 fL   MCH 27.4 26.0 - 34.0 pg   MCHC 31.4 30.0 - 36.0 g/dL   RDW 15.2 11.5 - 15.5 %   Platelets 569 (H) 150 - 400 K/uL   nRBC 0.0 0.0 - 0.2 %   Neutrophils Relative % 85 %   Neutro Abs 15.6 (H) 1.7 - 7.7 K/uL   Lymphocytes Relative 7 %   Lymphs Abs 1.3 0.7 - 4.0 K/uL   Monocytes Relative 6 %   Monocytes Absolute 1.1 (H) 0.1 - 1.0 K/uL   Eosinophils Relative 0 %   Eosinophils Absolute 0.1 0.0 - 0.5 K/uL   Basophils Relative 0 %   Basophils Absolute 0.0 0.0 - 0.1 K/uL   Immature Granulocytes 2 %   Abs Immature Granulocytes 0.27 (H) 0.00 - 0.07 K/uL    Comment: Performed at [facility], [address] [place], [place] [address]  Troponin I - ONCE - STAT     Status: Abnormal   Collection Time: 12/22/18  6:13 PM  Result Value Ref Range   Troponin I 0.05 (HH) <0.03 ng/mL    Comment: CRITICAL RESULT CALLED TO, READ BACK BY AND VERIFIED WITH GRACIE WINEMEN AT 1923 ON 12/22/2018 JJB Performed at [facility], [address] [name],  [address]   Comprehensive metabolic panel     Status: Abnormal   Collection Time: 12/22/18  6:13 PM  Result Value Ref Range   Sodium 141 135 - 145 mmol/L   Potassium 3.5 3.5 - 5.1 mmol/L   Chloride 107 98 - 111 mmol/L   CO2 20 (L) 22 - 32 mmol/L   Glucose, Bld 145 (H) 70 - 99 mg/dL   BUN 60 (H) 8 - 23 mg/dL    Creatinine, Ser 2.15 (H) 0.44 - 1.00 mg/dL   Calcium 8.9 8.9 - 10.3 mg/dL   Total Protein 7.0 6.5 - 8.1 g/dL   Albumin 2.9 (L) 3.5 - 5.0 g/dL   AST 32 15 - 41 U/L   ALT 17 0 - 44 U/L   Alkaline Phosphatase 68 38 - 126 U/L   Total Bilirubin  0.5 0.3 - 1.2 mg/dL   GFR calc non Af Amer 23 (L) >60 mL/min   GFR calc Af Amer 27 (L) >60 mL/min   Anion gap 14 5 - 15    Comment: Performed at [facility], [address] [place], [place] [address]  CK     Status: Abnormal   Collection Time: 12/22/18  6:13 PM  Result Value Ref Range   Total CK 751 (H) 38 - 234 U/L    Comment: Performed at [facility], [address] [place], [place] [address]  Urinalysis, Complete w Microscopic     Status: Abnormal   Collection Time: 12/22/18  6:17 PM  Result Value Ref Range   Color, Urine YELLOW (A) YELLOW   APPearance TURBID (A) CLEAR   Specific Gravity, Urine 1.012 1.005 - 1.030   pH 5.0 5.0 - 8.0   Glucose, UA NEGATIVE NEGATIVE mg/dL   Hgb urine dipstick MODERATE (A) NEGATIVE   Bilirubin Urine NEGATIVE NEGATIVE   Ketones, ur 5 (A) NEGATIVE mg/dL   Protein, ur 30 (A) NEGATIVE mg/dL   Nitrite NEGATIVE NEGATIVE   Leukocytes,Ua LARGE (A) NEGATIVE   RBC / HPF 21-50 0 - 5 RBC/hpf   WBC, UA >50 (H) 0 - 5 WBC/hpf   Bacteria, UA RARE (A) NONE SEEN   Squamous Epithelial / LPF NONE SEEN 0 - 5   Budding Yeast PRESENT     Comment: Performed at [facility], [address], [place], [place] [address]   Ct Head Wo Contrast  Result Date: 12/22/2018 CLINICAL DATA:  Fall.  Altered mental status. EXAM: CT HEAD WITHOUT CONTRAST CT CERVICAL SPINE WITHOUT CONTRAST TECHNIQUE: Multidetector CT imaging of the head and cervical spine was performed following the standard protocol without intravenous contrast. Multiplanar CT image reconstructions of the cervical spine were also generated. COMPARISON:  None. FINDINGS: CT HEAD FINDINGS Brain: Mild cerebral atrophy without hydrocephalus. Negative  for acute infarct, hemorrhage, or mass. Vascular: Negative for hyperdense vessel Skull: Negative Sinuses/Orbits: Negative Other: None CT CERVICAL SPINE FINDINGS Alignment: Mild anterolisthesis C4-5. Skull base and vertebrae: Negative for fracture Soft tissues and spinal canal: Negative Disc levels: Multilevel disc and facet degeneration throughout the cervical spine. Disc degeneration most prominent C5-6 and C6-7 Upper chest: Apically scarring bilaterally Other: None IMPRESSION: No acute intracranial abnormality Negative for cervical spine fracture. Electronically Signed   By: Franchot Gallo M.D.   On: 12/22/2018 19:51   Ct Cervical Spine Wo Contrast  Result Date: 12/22/2018 CLINICAL DATA:  Fall.  Altered mental status. EXAM: CT HEAD WITHOUT CONTRAST CT CERVICAL SPINE WITHOUT CONTRAST TECHNIQUE: Multidetector CT imaging of the head and cervical spine was performed following the standard protocol without intravenous contrast. Multiplanar CT image reconstructions of the cervical spine were also generated. COMPARISON:  None. FINDINGS: CT HEAD FINDINGS Brain: Mild cerebral atrophy without hydrocephalus. Negative for acute infarct, hemorrhage, or mass. Vascular: Negative for hyperdense vessel Skull: Negative Sinuses/Orbits: Negative Other: None CT CERVICAL SPINE FINDINGS Alignment: Mild anterolisthesis C4-5. Skull base and vertebrae: Negative for fracture Soft tissues and spinal canal: Negative Disc levels: Multilevel disc and facet degeneration throughout the cervical spine. Disc degeneration most prominent C5-6 and C6-7 Upper chest: Apically scarring bilaterally Other: None IMPRESSION: No acute intracranial abnormality Negative for cervical spine fracture. Electronically Signed   By: Franchot Gallo M.D.   On: 12/22/2018 19:51    Pending Labs [employer] (From admission, onward)    Start     Ordered  12/22/18 2052  Culture, blood (Routine X 2) w Reflex to ID Panel  BLOOD CULTURE X 2,   STAT    Comments:  STAT    12/22/18 2051   12/22/18 2052  Urine Culture  Add-on,   AD     12/22/18 2051   12/22/18 2000  Novel Coronavirus,NAA,(SEND-OUT TO REF LAB - TAT 24-48 hrs); Hosp Order  (Symptomatic Patients Labs with Precautions )  Once,   STAT    Question:  Current symptoms  Answer:  Other (testing not indicated)   12/22/18 2000   12/22/18 1817  Lactic acid, plasma  Now then every 2 hours,   STAT     12/22/18 1816   Signed and Held  CBC  (heparin)  Once,   R    Comments: Baseline for heparin therapy IF NOT ALREADY DRAWN.  Notify MD if PLT < 100 K.    Signed and Held   Signed and Held  Creatinine, serum  (heparin)  Once,   R    Comments: Baseline for heparin therapy IF NOT ALREADY DRAWN.    Signed and Held   Signed and Held  Basic metabolic panel  Tomorrow morning,   R     Signed and Held   Signed and Held  CBC  Tomorrow morning,   R     Signed and Held          Vitals/Pain Today's Vitals   12/22/18 1809 12/22/18 1820 12/22/18 1931 12/22/18 2000  BP: (!) 175/95   129/66  Pulse: 100   93  Resp: 20   (!) 22  Temp: (!) 97.5 F (36.4 C)     TempSrc: Axillary     SpO2: 99%   100%  Weight:  66.8 kg    Height:  5' (1.524 m)    PainSc:   2      Isolation Precautions Droplet and Contact precautions  Medications Medications  cefTRIAXone (ROCEPHIN) 1 g in sodium chloride 0.9 % 100 mL IVPB (0 g Intravenous Stopped 12/22/18 2046)    Mobility non-ambulatory High fall risk   Focused Assessments SKIN/Neuro   R Recommendations: See Admitting Provider Note  Report given to:   Additional Notes:

## 2018-12-22 NOTE — ED Provider Notes (Signed)
[facility] Emergency Department Provider Note  ____________________________________________   I have reviewed the triage vital signs and the nursing notes.   HISTORY  Chief Complaint Fall and Altered Mental Status   History limited by and level 5 caveat due to AMS   HPI Marlette R Muela is a 68 y.o. female who presents to the emergency department today via EMS with altered mental status. The patient cannot give any history. There is some concern for fall sense the patient was apparently fell down on the ground. Did talk to Babette Relic (listed as contact) over the telephone. He thinks that she is this way due to antipsychotics. Unfortunately he could not give any further pertinent history, however he was quite concerned and continued to come back to the fact that she cannot miss her methadone appointment.   Records reviewed. Per medical record review patient has a history of chronic pain,   Past Medical History:  Diagnosis Date  . Anxiety   . Arthritis   . Chronic pain   . Depression   . Diabetes mellitus without complication ([place])   . Drug abuse ([place])    History of polysubstance abuse; currently on methadone.  . Hepatitis    History of Hep "C". treated and cured with Harvoni  . History of chicken pox   . Hypertension   . Panic attacks   . Peripheral vascular disease ([place])    stent in place.   . Pre-diabetes   . UTI (urinary tract infection)    History of    Patient Active Problem List   Diagnosis Date Noted  . Atherosclerotic peripheral vascular disease with ulceration ([place]) 10/24/2018  . Atherosclerosis of artery of extremity with ulceration ([place]) 10/24/2018  . Atherosclerosis of native arteries of extremity with rest pain ([place]) 10/01/2018  . Chronic midline low back pain with bilateral sciatica 04/23/2018  . Prediabetes 04/10/2018  . Vitamin D deficiency 04/10/2018  . Swelling of limb 03/01/2018  . Atherosclerosis of native arteries of  extremity with intermittent claudication ([place]) 01/03/2018  . Leg pain 12/12/2017  . S/P right rotator cuff repair 01/16/2017  . Preoperative examination 01/14/2017  . Right shoulder pain 09/06/2016  . Hepatic cirrhosis ([place]) 02/09/2016  . Chronic hepatitis C without hepatic coma ([place]) 11/09/2015  . HLD (hyperlipidemia) 08/20/2015  . Peripheral vascular disease ([place]) 08/12/2015  . Essential hypertension 08/12/2015  . DM type 2 (diabetes mellitus, type 2) ([place]) 04/19/2015  . Current smoker 03/15/2015  . History of recreational drug use 03/15/2015  . Osteoarthritis 03/15/2015  . Preventative health care 03/15/2015  . Ulcer of left lower leg, limited to breakdown of skin ([place]) 03/15/2015  . Anxiety and depression 08/25/2012    Past Surgical History:  Procedure Laterality Date  . ABDOMINAL HYSTERECTOMY  1989  . AMPUTATION TOE Left 10/26/2018   Procedure: 5TH RAY RESCTION;  Surgeon: Albertine Patricia, DPM;  Location: [facility] ORS;  Service: Podiatry;  Laterality: Left;  . CHOLECYSTECTOMY  1987  . INTRACAPSULAR CATARACT EXTRACTION Left   . LOWER EXTREMITY ANGIOGRAPHY Left 12/17/2017   Procedure: LOWER EXTREMITY ANGIOGRAPHY;  Surgeon: Katha Cabal, MD;  Location: [place] [facility];  Service: Cardiovascular;  Laterality: Left;  . LOWER EXTREMITY ANGIOGRAPHY Left 10/24/2018   Procedure: LOWER EXTREMITY ANGIOGRAPHY;  Surgeon: Algernon Huxley, MD;  Location: [place] [facility];  Service: Cardiovascular;  Laterality: Left;  . PERIPHERAL VASCULAR CATHETERIZATION Left 05/04/2015   Procedure: Lower Extremity Angiography;  Surgeon: Katha Cabal, MD;  Location:  [place] [facility];  Service: Cardiovascular;  Laterality: Left;  . PERIPHERAL VASCULAR CATHETERIZATION Left 05/04/2015   Procedure: Lower Extremity Intervention;  Surgeon: Katha Cabal, MD;  Location: [place] [facility] LAB;  Service: Cardiovascular;  Laterality: Left;  . SHOULDER ARTHROSCOPY WITH OPEN ROTATOR CUFF REPAIR Right  01/16/2017   Procedure: SHOULDER ARTHROSCOPY WITH OPEN ROTATOR CUFF REPAIR;  Surgeon: Thornton Park, MD;  Location: [facility] ORS;  Service: Orthopedics;  Laterality: Right;  . TONSILLECTOMY AND ADENOIDECTOMY  1971    Prior to Admission medications   Medication Sig Start Date End Date Taking? Authorizing Provider  acetaminophen (TYLENOL) 325 MG tablet Take 2 tablets (650 mg total) by mouth every 6 (six) hours as needed for mild pain (or Fever >/= 101). 11/03/18   Stegmayer, Joelene Millin A, PA-C  ARIPiprazole (ABILIFY) 2 MG tablet Take 1 tablet (2 mg total) by mouth at bedtime. 11/03/18   Stegmayer, Janalyn Harder, PA-C  aspirin 81 MG tablet Take 81 mg by mouth daily.    [provider]  atorvastatin (LIPITOR) 40 MG tablet Take 1 tablet (40 mg total) by mouth daily. 04/10/18   [name], [name], MD  cholecalciferol (VITAMIN D3) 25 MCG (1000 UT) tablet Take 1 tablet (1,000 Units total) by mouth daily. 11/04/18   Stegmayer, Joelene Millin A, PA-C  clonazePAM (KLONOPIN) 1 MG tablet Take 1 tablet (1 mg total) by mouth 2 (two) times daily. 11/04/18   Stegmayer, Joelene Millin A, PA-C  clopidogrel (PLAVIX) 75 MG tablet TAKE 1 TABLET BY MOUTH ONCE DAILY 12/23/17   Schnier, Dolores Lory, MD  docusate sodium (COLACE) 100 MG capsule Take 2 capsules (200 mg total) by mouth daily as needed for mild constipation or moderate constipation. 11/03/18   Stegmayer, Joelene Millin A, PA-C  escitalopram (LEXAPRO) 5 MG tablet Take 1 tablet (5 mg total) by mouth daily. 11/04/18   Stegmayer, Joelene Millin A, PA-C  feeding supplement, GLUCERNA SHAKE, (GLUCERNA SHAKE) LIQD Take 237 mLs by mouth 2 (two) times daily between meals. 11/03/18   Stegmayer, Joelene Millin A, PA-C  ferrous sulfate 325 (65 FE) MG tablet Take 325 mg by mouth daily.    [provider]  lisinopril (PRINIVIL,ZESTRIL) 10 MG tablet Take 1 tablet (10 mg total) by mouth daily. 04/23/18   [name], [name], MD  methadone (DOLOPHINE) 10 MG/ML solution Take 8 mLs (80 mg total) by  mouth daily. 11/04/18   Stegmayer, Janalyn Harder, PA-C  Multiple Vitamins-Iron (MULTIVITAMINS WITH IRON) TABS tablet Take 1 tablet by mouth daily. 11/04/18   Stegmayer, Joelene Millin A, PA-C  polyethylene glycol (MIRALAX / GLYCOLAX) 17 g packet Take 17 g by mouth daily as needed for severe constipation. 11/03/18   Stegmayer, Joelene Millin A, PA-C  potassium chloride SA (K-DUR) 20 MEQ tablet Take 2 tablets (40 mEq total) by mouth daily. Patient not taking: Reported on 12/09/2018 11/04/18   Stegmayer, Joelene Millin A, PA-C  vitamin C (VITAMIN C) 250 MG tablet Take 1 tablet (250 mg total) by mouth 2 (two) times daily. 11/03/18   Stegmayer, Janalyn Harder, PA-C    Allergies Patient has no known allergies.  Family History  Problem Relation Age of Onset  . Arthritis Mother   . Mental illness Mother        depression  . Diabetes Mother   . Cancer Mother        pancreatic  . Cancer Father        colon  . Heart disease Father   . Diabetes Father   . Cancer Daughter  lung    Social History Social History   Tobacco Use  . Smoking status: Former Smoker    Packs/day: 0.50    Years: 30.00    Pack years: 15.00    Types: Cigarettes    Quit date: 07/25/2018    Years since quitting: 0.4  . Smokeless tobacco: Never Used  . Tobacco comment: Currently smoking 1/2 ppd  Substance Use Topics  . Alcohol use: No    Alcohol/week: 0.0 standard drinks  . Drug use: No    Comment: Hx of.    Review of Systems Unable to obtain secondary to altered mental status.  ____________________________________________   PHYSICAL EXAM:  VITAL SIGNS: ED Triage Vitals [12/22/18 1809]  Enc Vitals Group     BP (!) 175/95     Pulse Rate 100     Resp 20     Temp (!) 97.5 F (36.4 C)     Temp Source Axillary     SpO2 99 %   Constitutional: Awake and alert. Not oriented Eyes: Conjunctivae are normal.  ENT      Head: Normocephalic and atraumatic.      Nose: No congestion/rhinnorhea.      Mouth/Throat: Mucous membranes are  moist.      Neck: No stridor. Hematological/Lymphatic/Immunilogical: No cervical lymphadenopathy. Cardiovascular: Normal rate, regular rhythm.  No murmurs, rubs, or gallops.  Respiratory: Normal respiratory effort without tachypnea nor retractions. Breath sounds are clear and equal bilaterally. No wheezes/rales/rhonchi. Gastrointestinal: Soft and distended.  Genitourinary: Deferred Musculoskeletal: Normal range of motion in all extremities. No lower extremity edema. Neurologic:  Not oriented. Moving all extremities.  Skin:  Significant sacral ulcers.  ____________________________________________    LABS (pertinent positives/negatives)  CK 751 CMP na 141, k 3.5, glu 145, BUN 60, cr 2.15 UA turbid, >50 WBC, large leukocytes Trop 0.05 Lactic acid 1.6 CBC wbc 18.4, hgb 9.1, plt 569  ____________________________________________   EKG  None  ____________________________________________    RADIOLOGY  CT head/cervical spine No acute intracranial abnormality  ____________________________________________   PROCEDURES  Procedures  ____________________________________________   INITIAL IMPRESSION / ASSESSMENT AND PLAN / ED COURSE  Pertinent labs & imaging results that were available during my care of the patient were reviewed by me and considered in my medical decision making (see chart for details).   Patient presented to the emergency department today because of concerns for altered mental status and being found on the ground.  Upon arrival patient is unable to give any significant history.  She is quite altered.  Appearance is quite concerning for bad sacral ulcers.  She also had bandage of her left foot which had not been changed for about a week.  Patient's work-up was notable for leukocytosis and urinary findings concerning for infection.  Do think patient would benefit from hospitalization.  Will start IV  antibiotics.  ____________________________________________   FINAL CLINICAL IMPRESSION(S) / ED DIAGNOSES  Final diagnoses:  Lower urinary tract infectious disease  Altered mental status, unspecified altered mental status type  Elevated CK     Note: This dictation was prepared with Dragon dictation. Any transcriptional errors that result from this process are unintentional     Nance Pear, MD 12/22/18 2019

## 2018-12-23 LAB — BASIC METABOLIC PANEL
Anion gap: 9 (ref 5–15)
BUN: 55 mg/dL — ABNORMAL HIGH (ref 8–23)
CO2: 22 mmol/L (ref 22–32)
Calcium: 8.4 mg/dL — ABNORMAL LOW (ref 8.9–10.3)
Chloride: 112 mmol/L — ABNORMAL HIGH (ref 98–111)
Creatinine, Ser: 1.87 mg/dL — ABNORMAL HIGH (ref 0.44–1.00)
GFR calc Af Amer: 31 mL/min — ABNORMAL LOW (ref >60)
GFR calc non Af Amer: 27 mL/min — ABNORMAL LOW (ref >60)
Glucose, Bld: 132 mg/dL — ABNORMAL HIGH (ref 70–99)
Potassium: 3.4 mmol/L — ABNORMAL LOW (ref 3.5–5.1)
Sodium: 143 mmol/L (ref 135–145)

## 2018-12-23 LAB — AMMONIA: Ammonia: 9 umol/L (ref 9–35)

## 2018-12-23 LAB — CBC
HCT: 25.6 % — ABNORMAL LOW (ref 36.0–46.0)
Hemoglobin: 8 g/dL — ABNORMAL LOW (ref 12.0–15.0)
MCH: 27.5 pg (ref 26.0–34.0)
MCHC: 31.3 g/dL (ref 30.0–36.0)
MCV: 88 fL (ref 80.0–100.0)
Platelets: 436 10*3/uL — ABNORMAL HIGH (ref 150–400)
RBC: 2.91 MIL/uL — ABNORMAL LOW (ref 3.87–5.11)
RDW: 15 % (ref 11.5–15.5)
WBC: 16.9 10*3/uL — ABNORMAL HIGH (ref 4.0–10.5)
nRBC: 0 % (ref 0.0–0.2)

## 2018-12-23 LAB — GLUCOSE, CAPILLARY
Glucose-Capillary: 146 mg/dL — ABNORMAL HIGH (ref 70–99)
Glucose-Capillary: 152 mg/dL — ABNORMAL HIGH (ref 70–99)
Glucose-Capillary: 135 mg/dL — ABNORMAL HIGH (ref 70–99)
Glucose-Capillary: 110 mg/dL — ABNORMAL HIGH (ref 70–99)

## 2018-12-23 LAB — TROPONIN I
Troponin I: <0.03 ng/mL (ref <0.03)
Troponin I: 0.03 ng/mL (ref <0.03)

## 2018-12-23 LAB — CK: Total CK: 598 U/L — ABNORMAL HIGH (ref 38–234)

## 2018-12-23 MED ORDER — POTASSIUM CHLORIDE 20 MEQ PO PACK
40.0000 meq | PACK | Freq: Once | ORAL | Status: AC
  Administered 2018-12-23: 40 meq via ORAL
  Filled 2018-12-23: qty 2

## 2018-12-23 MED ORDER — NEPRO/CARBSTEADY PO LIQD
237.0000 mL | Freq: Two times a day (BID) | ORAL | Status: DC
  Administered 2018-12-23: 237 mL via ORAL

## 2018-12-23 MED ORDER — SODIUM CHLORIDE 0.9 % IV SOLN
1.0000 g | Freq: Two times a day (BID) | INTRAVENOUS | Status: DC
  Filled 2018-12-23: qty 1

## 2018-12-23 MED ORDER — SODIUM CHLORIDE 0.9 % IV SOLN
2.0000 g | INTRAVENOUS | Status: DC
  Administered 2018-12-23 – 2018-12-24 (×2): 2 g via INTRAVENOUS
  Filled 2018-12-23 (×2): qty 2

## 2018-12-23 MED ORDER — SODIUM CHLORIDE 0.9 % IV SOLN: 1.0000 g | INTRAVENOUS | Status: DC

## 2018-12-23 MED ORDER — VITAMIN C 500 MG PO TABS
500.0000 mg | ORAL_TABLET | Freq: Two times a day (BID) | ORAL | Status: DC
  Administered 2018-12-23 – 2018-12-26 (×5): 500 mg via ORAL
  Filled 2018-12-23 (×6): qty 1

## 2018-12-23 MED ORDER — ADULT MULTIVITAMIN W/MINERALS CH
1.0000 | ORAL_TABLET | Freq: Every day | ORAL | Status: DC
  Administered 2018-12-25: 1 via ORAL
  Filled 2018-12-23 (×3): qty 1

## 2018-12-23 MED ORDER — VANCOMYCIN HCL 1.25 G IV SOLR
1250.0000 mg | INTRAVENOUS | Status: DC
  Filled 2018-12-23: qty 1250

## 2018-12-23 NOTE — Evaluation (Signed)
Physical Therapy Evaluation Patient Details Name: Yesenia Shepard MRN: [id number] DOB: 1950/07/10 Today's Date: 12/23/2018   History of Present Illness  Pt is 68 yo female that presented to ED after a fall at home, unclear how long pt was down. Pt with AMS, sepsis, UTI, sacral wound. 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 Pt spoke with Dr. Selina Cooley RN prior to session for weight bearing status, indicated WBAT in post-op shoe.    Clinical Impression  Pt awake, PT noted some emesis on gown/pt, RN informed. Pt unable to report if she was having any stomach pain, and no further instances of emesis noted during session. Per chart review PLOF gathered from 1 month ago. Pt unreliable historian, unable to report how much assistance she currently needs. Pt was able to follow simple one step commands, but extended time needed for delayed responses and occasionally able to answer questions. Pt was able to stated multiple times that she wanted to go home and asked for water.  Bed mobility performed, maxAx1 with HOB for supine <> sit. Pt able to initiate movement and reach for bed rails. Pt sat EOB, mod-minA to maitain sitting balance. Pt fatigued quickly and trembling noted. vitals WFLs and pt requested to return to supine after 2 minutes. Further mobility held due to safety concerns at this time. Repositioned in bed totalx2 and turned for skin protection, prevalon boot placed back on L foot.  Overall the patient demonstrated deficits (see "PT Problem List") that impede the patient's functional abilities, safety, and mobility and would benefit from skilled PT intervention. Recommendation is STR due to current functional status.      Follow Up Recommendations SNF    Equipment Recommendations  Other (comment)(TBD)    Recommendations for Other Services       Precautions / Restrictions Precautions Precautions:  Fall;Other (comment) Precaution Comments: L foot WBAT, Post op shoe (in pt room) Required Braces or Orthoses: Other Brace(post op shoe) Restrictions Weight Bearing Restrictions: Yes LLE Weight Bearing: Weight bearing as tolerated      Mobility  Bed Mobility Overal bed mobility: Needs Assistance Bed Mobility: Supine to Sit;Sit to Supine     Supine to sit: Max assist;HOB elevated Sit to supine: Max assist;HOB elevated   General bed mobility comments: totalAx2 to reposition in bed  Transfers                 General transfer comment: deferred due to safety concerns  Ambulation/Gait             General Gait Details: deferred due to safety concerns  Stairs            Wheelchair Mobility    Modified Rankin (Stroke Patients Only)       Balance Overall balance assessment: Needs assistance Sitting-balance support: Feet unsupported;Bilateral upper extremity supported Sitting balance-Leahy Scale: Poor Sitting balance - Comments: mod-minA to maitain sitting balance. fatigued quickly, trembling noted. vitals WFLs and pt requested to return to supine after 2 minutes                                     Pertinent Vitals/Pain Pain Assessment: Faces Faces Pain Scale: Hurts a little bit Pain Location: back with supine to sit    Home Living Family/patient expects to be discharged to:: Private residence Living Arrangements: Spouse/significant  other Available Help at Discharge: Family;Available 24 hours/day Type of Home: House Home Access: Ramped entrance     Home Layout: One level Home Equipment: Walker - 2 wheels;Shower seat      Prior Function           Comments: Per chart review PLOF gathered from 1 month ago. Pt unreliable historian, unable to report how much assistance she currently needs. Recently at [facility] after last hospital admission.     Hand Dominance   Dominant Hand: Right    Extremity/Trunk Assessment   Upper  Extremity Assessment Upper Extremity Assessment: RUE deficits/detail;LUE deficits/detail RUE Deficits / Details: 2+/5, needs AAROM/tactile cues to move extremities LUE Deficits / Details: 2+/5, needs AAROM/tactile cues to move extremities    Lower Extremity Assessment Lower Extremity Assessment: RLE deficits/detail;LLE deficits/detail RLE Deficits / Details: unable to lift LE independent from bed, or perform heel slides, little to no DF noted in ankle LLE Deficits / Details: unable to lift LE independently from bed or perform heel slides, little to no DF noted in ankle       Communication   Communication: Other (comment)(AMS, extended time to answer, answers intermittently)  Cognition Arousal/Alertness: Awake/alert Behavior During Therapy: Flat affect;WFL for tasks assessed/performed Overall Cognitive Status: No family/caregiver present to determine baseline cognitive functioning                                 General Comments: Pt unable to provide PLOF information, delayed response, but able to follow simple one step commands      General Comments      Exercises     Assessment/Plan    PT Assessment Patient needs continued PT services  PT Problem List Decreased strength;Decreased mobility;Decreased safety awareness;Decreased range of motion;Decreased coordination;Decreased activity tolerance;Decreased balance;Decreased knowledge of use of DME;Decreased cognition;Pain;Decreased knowledge of precautions       PT Treatment Interventions DME instruction;Therapeutic exercise;Gait training;Balance training;Stair training;Functional mobility training;Therapeutic activities;Patient/family education    PT Goals (Current goals can be found in the Care Plan section)  Acute Rehab PT Goals Patient Stated Goal: to go home PT Goal Formulation: With patient Time For Goal Achievement: 01/06/19 Potential to Achieve Goals: Fair    Frequency Min 2X/week   Barriers to  discharge        Co-evaluation               AM-PAC PT "6 Clicks" Mobility  Outcome Measure Help needed turning from your back to your side while in a flat bed without using bedrails?: A Lot Help needed moving from lying on your back to sitting on the side of a flat bed without using bedrails?: A Lot Help needed moving to and from a bed to a chair (including a wheelchair)?: Total Help needed standing up from a chair using your arms (e.g., wheelchair or bedside chair)?: Total Help needed to walk in hospital room?: Total Help needed climbing 3-5 steps with a railing? : Total 6 Click Score: 8    End of Session   Activity Tolerance: Patient limited by fatigue Patient left: in bed;with call bell/phone within reach;with bed alarm set;Other (comment)(prevalon boot in place) Nurse Communication: Mobility status PT Visit Diagnosis: Other abnormalities of gait and mobility (R26.89);Muscle weakness (generalized) (M62.81);Unsteadiness on feet (R26.81)    Time: 9323-5573 PT Time Calculation (min) (ACUTE ONLY): 29 min   Charges:   PT Evaluation $PT Eval Moderate Complexity: 1 Mod PT  Treatments $Therapeutic Activity: 8-22 mins       Lieutenant Diego PT, DPT 2:13 PM,12/23/18 [phone]

## 2018-12-23 NOTE — Progress Notes (Addendum)
Initial Nutrition Assessment  DOCUMENTATION CODES:   Not applicable  INTERVENTION:   Nepro Shake po BID, each supplement provides 425 kcal and 19 grams protein  MVI daily   Vitamin C 500mg  po BID  Dysphagia 3 diet   Pt at high refeed risk; recommend monitor K, Mg and P labs daily as oral intake improves.   NUTRITION DIAGNOSIS:   Increased nutrient needs related to wound healing as evidenced by increased estimated needs.  GOAL:   Patient will meet greater than or equal to 90% of their needs  MONITOR:   PO intake, Supplement acceptance, Labs, Weight trends, Skin, I & O's  REASON FOR ASSESSMENT:   Consult Wound healing  ASSESSMENT:   68 y/o famale with h/o substance abuse on methadone, DM, CKD IV admitted s/p fall and found to have UTI and sepsis  RD working remotely.  Unable to speak with pt r/t AMS. Per chart review, suspect poor appetite and oral intake pta. Pt with multiple wounds. Per chart, pt appears to have lost 50lbs(28%) over the past 5 months; this is severe weight loss. RD will add supplements and MVI to support wound healing and help her meet her estimated needs. Suspect pt at high refeed risk. Will change pt to dysphagia 3 diet also.   Pt at high risk for malnutrition but unable to diagnose at this time as NFPE cannot be performed.   Medications reviewed and include: aspirin, plavix, heparin, insulin, cefepime, vancomycin   Labs reviewed: K 3.4(L), BUN 55(H), creat 1.87(H) Wbc- 16.9(H), Hgb 8.0(L), Hct 25.6(L) cbgs- 146, 152 x 24 hrs AIC 6.3- 04/2018  Unable to complete Nutrition-Focused physical exam at this time.   Diet Order:   Diet Order            Diet heart healthy/carb modified Room service appropriate? Yes; Fluid consistency: Thin  Diet effective now             EDUCATION NEEDS:   Not appropriate for education at this time  Skin:  Skin Assessment: Reviewed RN Assessment(Large Deep Tissue Pressure Injury: sacrum, Sacrum: dark  purple, non blanchable tissue that extends over the sacrum and bilateral buttocks with superficial skin peeling, Stage 2 Pressure injury; right heel)  Last BM:  pta  Height:   Ht Readings from Last 1 Encounters:  12/22/18 5\' 6"  (1.676 m)    Weight:   Wt Readings from Last 1 Encounters:  12/22/18 59 kg    Ideal Body Weight:  59 kg  BMI:  Body mass index is 20.99 kg/m.  Estimated Nutritional Needs:   Kcal:  1500-1700kcal/day  Protein:  75-85g/day  Fluid:  >1.5L/day  Koleen Distance MS, RD, LDN Pager #- [phone] Office#- [phone] After Hours Pager: [phone]

## 2018-12-23 NOTE — Progress Notes (Signed)
[facility] [place] NAME: Aryahi Denzler    MR#:  [id number]  DATE OF BIRTH:  06-19-1951  SUBJECTIVE:  CHIEF COMPLAINT:   Chief Complaint  Patient presents with  . Fall  . Altered Mental Status   No new complaint this morning still appears pleasantly confused.  No fevers.  Patient more awake and alert this morning.  REVIEW OF SYSTEMS:  ROS unobtainable due to medical condition DRUG ALLERGIES:  No Known Allergies VITALS:  Blood pressure 140/61, pulse 87, temperature 98.7 F (37.1 C), temperature source Oral, resp. rate 18, height 5\' 6"  (1.676 m), weight 59 kg, SpO2 100 %. PHYSICAL EXAMINATION:   Physical Exam  Constitutional: She appears well-developed and well-nourished.  HENT:  Head: Normocephalic and atraumatic.  Right Ear: External ear normal.  Eyes: Pupils are equal, round, and reactive to light. Conjunctivae are normal. Right eye exhibits no discharge.  Neck: Normal range of motion. Neck supple. No tracheal deviation present.  Cardiovascular: Normal rate, regular rhythm and normal heart sounds.  Respiratory: Effort normal. No respiratory distress.  GI: Soft. Bowel sounds are normal. She exhibits no distension.  Musculoskeletal: Normal range of motion.        General: No edema.  Neurological: She is alert.  Pleasantly confused  Skin:  Unstageable sacral ulcer  Psychiatric: She has a normal mood and affect. Her behavior is normal.   LABORATORY PANEL:  Female CBC Recent Labs  Lab 12/23/18 0133  WBC 16.9*  HGB 8.0*  HCT 25.6*  PLT 436*   ------------------------------------------------------------------------------------------------------------------ Chemistries  Recent Labs  Lab 12/22/18 1813  12/23/18 0133  NA 141  --  143  K 3.5  --  3.4*  CL 107  --  112*  CO2 20*  --  22  GLUCOSE 145*  --  132*  BUN 60*  --  55*  CREATININE 2.15*   < > 1.87*  CALCIUM 8.9  --  8.4*  AST 32  --   --   ALT 17  --   --    ALKPHOS 68  --   --   BILITOT 0.5  --   --    < > = values in this interval not displayed.   RADIOLOGY:  Ct Head Wo Contrast  Result Date: 12/22/2018 CLINICAL DATA:  Fall.  Altered mental status. EXAM: CT HEAD WITHOUT CONTRAST CT CERVICAL SPINE WITHOUT CONTRAST TECHNIQUE: Multidetector CT imaging of the head and cervical spine was performed following the standard protocol without intravenous contrast. Multiplanar CT image reconstructions of the cervical spine were also generated. COMPARISON:  None. FINDINGS: CT HEAD FINDINGS Brain: Mild cerebral atrophy without hydrocephalus. Negative for acute infarct, hemorrhage, or mass. Vascular: Negative for hyperdense vessel Skull: Negative Sinuses/Orbits: Negative Other: None CT CERVICAL SPINE FINDINGS Alignment: Mild anterolisthesis C4-5. Skull base and vertebrae: Negative for fracture Soft tissues and spinal canal: Negative Disc levels: Multilevel disc and facet degeneration throughout the cervical spine. Disc degeneration most prominent C5-6 and C6-7 Upper chest: Apically scarring bilaterally Other: None IMPRESSION: No acute intracranial abnormality Negative for cervical spine fracture. Electronically Signed   By: Franchot Gallo M.D.   On: 12/22/2018 19:51   Ct Cervical Spine Wo Contrast  Result Date: 12/22/2018 CLINICAL DATA:  Fall.  Altered mental status. EXAM: CT HEAD WITHOUT CONTRAST CT CERVICAL SPINE WITHOUT CONTRAST TECHNIQUE: Multidetector CT imaging of the head and cervical spine was performed following the standard protocol without intravenous contrast. Multiplanar CT image reconstructions  of the cervical spine were also generated. COMPARISON:  None. FINDINGS: CT HEAD FINDINGS Brain: Mild cerebral atrophy without hydrocephalus. Negative for acute infarct, hemorrhage, or mass. Vascular: Negative for hyperdense vessel Skull: Negative Sinuses/Orbits: Negative Other: None CT CERVICAL SPINE FINDINGS Alignment: Mild anterolisthesis C4-5. Skull base and  vertebrae: Negative for fracture Soft tissues and spinal canal: Negative Disc levels: Multilevel disc and facet degeneration throughout the cervical spine. Disc degeneration most prominent C5-6 and C6-7 Upper chest: Apically scarring bilaterally Other: None IMPRESSION: No acute intracranial abnormality Negative for cervical spine fracture. Electronically Signed   By: Franchot Gallo M.D.   On: 12/22/2018 19:51   ASSESSMENT AND PLAN:   1. Sepsis secondary to urinary tract infection  Being treated with IV antibiotics.  IV fluids.  Patient on broad-spectrum IV antibiotics with cefepime and vancomycin.  Will possibly de-escalate antibiotics in a.m.  And monitor clinically.    2.  Elevated CK.   Continue IV fluids.  Follow-up on CK level in a.m.   3.  DM type 2 (diabetes mellitus, type 2) (HCC) -sliding scale insulin    4.Essential hypertension -home dose antihypertensives   5. Anxiety and depression -continue home meds    6. HLD (hyperlipidemia) -home dose antilipid    7.CKD (chronic kidney disease), stage IV (HCC) -at baseline, avoid nephrotoxins  8.Stage II pressure injury to the right heel. Sacrum with unstageable ulcer.  Dark for for non-blanchable tissue over the sacrum with superficial skin peeling.  Seen by wound care nurse.  Plan is to add silicone foam to use and change every 3 days on PRN.  To add mattress replacement for moisture management and pressure redistribution.  Silicone foam in place for deep tissue pressure injury. Palliative care consult placed.  DVT prophylaxis; heparin   All the records are reviewed and case discussed with Care Management/Social Worker.  CODE STATUS: Full Code  TOTAL TIME TAKING CARE OF THIS PATIENT: 36 minutes.   More than 50% of the time was spent in counseling/coordination of care: YES  POSSIBLE D/C IN 2 DAYS, DEPENDING ON CLINICAL CONDITION.   Goldie Dimmer M.D on 12/23/2018 at 4:03 PM  Between 7am to 6pm - Pager - [phone]  After 6pm  go to [email or web address] - Proofreader  [facility]  Office  [phone]  CC: Primary care physician; [name], [name], MD  Note: This dictation was prepared with Dragon dictation along with smaller phrase technology. Any transcriptional errors that result from this process are unintentional.

## 2018-12-23 NOTE — Progress Notes (Signed)
Pharmacy Antibiotic Note  Yesenia Shepard is a 68 y.o. female admitted on 12/22/2018 with urosepsis.  Pharmacy has been consulted for vanc/cefepime dosing. Patient received vanc 1.25g IV load and ceftriaxone 1g IV x 1 in ED.  Plan: Patient originally received ceftriaxone 1g IV x 1 for sepsis s/t UTI, but consult placed for vanc/cefepime.  Vancomycin 1250 mg IV Q 48 hrs. Goal AUC 400-550. Expected AUC: 549.8 SCr used: 1.87 Cssmin: 11.7  Will continue cefepime 1g IV q12h per CrCl 11 - 29 ml/min.  Height: 5\' 6"  (167.6 cm) Weight: 130 lb 1.1 oz (59 kg) IBW/kg (Calculated) : 59.3  Temp (24hrs), Avg:97.9 F (36.6 C), Min:97.5 F (36.4 C), Max:98.2 F (36.8 C)  Recent Labs  Lab 12/22/18 1813 12/22/18 2334 12/23/18 0133  WBC 18.4* 17.3* 16.9*  CREATININE 2.15* 1.92* 1.87*  LATICACIDVEN 1.6  --   --     Estimated Creatinine Clearance: 26.8 mL/min (A) (by C-G formula based on SCr of 1.87 mg/dL (H)).    No Known Allergies  Thank you for allowing pharmacy to be a part of this patient's care.  Tobie Lords, PharmD, BCPS Clinical Pharmacist 12/23/2018 2:32 AM

## 2018-12-23 NOTE — Consult Note (Signed)
[facility] Nurse wound consult note Consultation was completed by review of records, images and assistance from the bedside nurse/clinical staff.   Reason for Consult: sacrum and legs Wound type: Large Deep Tissue Pressure Injury: sacrum Ruptured bulla; intact roof; partial thickness; right dorsal foot Stage 2 Pressure injury; right heel Pressure Injury POA: Yes Measurement: requested to place on nursing flowsheet when other orders acknowledged  Wound bed: Sacrum: dark purple, non blanchable tissue that extends over the sacrum and bilateral buttocks with superficial skin peeling. 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 Add silicone foam for the heels, change every 3 days and PRN 2. Add Prevalon boots for offloading 3. Add dietician consultation for wound healing 4. Add mattress replacement for moisture management and pressure redistribution 5. Silicone foam in place for deep tissue pressure injury; which is appropriate until injury evolves.   Consider palliative care consultation based on the presentation of this wound and current status   WOC nurse team will follow along to monitor sacral wound and need for updated topical therapy  Discussed POC with patient and bedside nurse.  Re consult if needed, will not follow at this time. Thanks  Parmvir Boomer [employer], RN,CWOCN, CNS, [place] [phone])

## 2018-12-23 NOTE — Progress Notes (Signed)
[facility] Adult protective services agent Jeanice Lim [phone]) here today to talk with patient. She said there were three calls that came to her office yesterday about this patient and her boyfriend. She has concerns about this situation and requests that patient not be discharged back to the home with the boyfriend

## 2018-12-24 ENCOUNTER — Other Ambulatory Visit: Payer: Self-pay

## 2018-12-24 DIAGNOSIS — L8990 Pressure ulcer of unspecified site, unspecified stage: Secondary | ICD-10-CM | POA: Insufficient documentation

## 2018-12-24 DIAGNOSIS — Z7189 Other specified counseling: Secondary | ICD-10-CM

## 2018-12-24 LAB — CBC
HCT: 23.8 % — ABNORMAL LOW (ref 36.0–46.0)
Hemoglobin: 7.6 g/dL — ABNORMAL LOW (ref 12.0–15.0)
MCH: 28.3 pg (ref 26.0–34.0)
MCHC: 31.9 g/dL (ref 30.0–36.0)
MCV: 88.5 fL (ref 80.0–100.0)
Platelets: 379 10*3/uL (ref 150–400)
RBC: 2.69 MIL/uL — ABNORMAL LOW (ref 3.87–5.11)
RDW: 15.2 % (ref 11.5–15.5)
WBC: 13.2 10*3/uL — ABNORMAL HIGH (ref 4.0–10.5)
nRBC: 0 % (ref 0.0–0.2)

## 2018-12-24 LAB — BASIC METABOLIC PANEL
Anion gap: 9 (ref 5–15)
BUN: 33 mg/dL — ABNORMAL HIGH (ref 8–23)
CO2: 23 mmol/L (ref 22–32)
Calcium: 7.9 mg/dL — ABNORMAL LOW (ref 8.9–10.3)
Chloride: 109 mmol/L (ref 98–111)
Creatinine, Ser: 1.17 mg/dL — ABNORMAL HIGH (ref 0.44–1.00)
GFR calc Af Amer: 55 mL/min — ABNORMAL LOW (ref >60)
GFR calc non Af Amer: 48 mL/min — ABNORMAL LOW (ref >60)
Glucose, Bld: 133 mg/dL — ABNORMAL HIGH (ref 70–99)
Potassium: 3.1 mmol/L — ABNORMAL LOW (ref 3.5–5.1)
Sodium: 141 mmol/L (ref 135–145)

## 2018-12-24 LAB — MAGNESIUM: Magnesium: 1.6 mg/dL — ABNORMAL LOW (ref 1.7–2.4)

## 2018-12-24 LAB — NOVEL CORONAVIRUS, NAA (HOSP ORDER, SEND-OUT TO REF LAB; TAT 18-24 HRS): SARS-CoV-2, NAA: NOT DETECTED

## 2018-12-24 LAB — GLUCOSE, CAPILLARY
Glucose-Capillary: 142 mg/dL — ABNORMAL HIGH (ref 70–99)
Glucose-Capillary: 125 mg/dL — ABNORMAL HIGH (ref 70–99)
Glucose-Capillary: 113 mg/dL — ABNORMAL HIGH (ref 70–99)
Glucose-Capillary: 149 mg/dL — ABNORMAL HIGH (ref 70–99)
Glucose-Capillary: 133 mg/dL — ABNORMAL HIGH (ref 70–99)

## 2018-12-24 LAB — CK: Total CK: 139 U/L (ref 38–234)

## 2018-12-24 LAB — PHOSPHORUS: Phosphorus: 1.8 mg/dL — ABNORMAL LOW (ref 2.5–4.6)

## 2018-12-24 LAB — URINE CULTURE: Culture: NO GROWTH

## 2018-12-24 LAB — VANCOMYCIN, RANDOM: Vancomycin Rm: 8

## 2018-12-24 MED ORDER — POTASSIUM PHOSPHATES 15 MMOLE/5ML IV SOLN
30.0000 mmol | Freq: Once | INTRAVENOUS | Status: AC
  Administered 2018-12-24: 30 mmol via INTRAVENOUS
  Filled 2018-12-24: qty 10

## 2018-12-24 MED ORDER — MAGNESIUM SULFATE IN D5W 1-5 GM/100ML-% IV SOLN
1.0000 g | Freq: Once | INTRAVENOUS | Status: AC
  Administered 2018-12-24: 1 g via INTRAVENOUS
  Filled 2018-12-24: qty 100

## 2018-12-24 MED ORDER — VANCOMYCIN HCL IN DEXTROSE 750-5 MG/150ML-% IV SOLN
750.0000 mg | INTRAVENOUS | Status: DC
  Filled 2018-12-24: qty 150

## 2018-12-24 MED ORDER — POTASSIUM CHLORIDE CRYS ER 20 MEQ PO TBCR
40.0000 meq | EXTENDED_RELEASE_TABLET | Freq: Once | ORAL | Status: DC
  Filled 2018-12-24: qty 2

## 2018-12-24 MED ORDER — SODIUM CHLORIDE 0.9 % IV SOLN
2.0000 g | Freq: Two times a day (BID) | INTRAVENOUS | Status: DC
  Administered 2018-12-24 – 2018-12-25 (×2): 2 g via INTRAVENOUS
  Filled 2018-12-24 (×3): qty 2

## 2018-12-24 MED ORDER — METHADONE HCL 10 MG PO TABS
80.0000 mg | ORAL_TABLET | Freq: Every day | ORAL | Status: DC
  Administered 2018-12-24: 80 mg via ORAL
  Filled 2018-12-24: qty 8

## 2018-12-24 NOTE — Progress Notes (Signed)
Wasted 20 mg methadone pills with Engineer, [employer]. Pt did not want to take the rest of the pills.

## 2018-12-24 NOTE — Consult Note (Addendum)
Consultation Note Date: 12/24/2018   Patient Name: Yesenia Shepard  DOB: 19-Jan-1951  MRN: [id number]  Age / Sex: 68 y.o., female  PCP: [name], [name], MD Referring Physician: Otila Back, MD  Reason for Consultation: Establishing goals of care  HPI/Patient Profile: [place]  is a 68 y.o. female who presents with chief complaint as above.  Patient presents the ED after a fall at home.  Clinical Assessment and Goals of Care: Patient is resting in bed with her eyes closed upon my arrival. She tells me her full name, that she is at [facility], that she is in [place], that the year is 2020, Trump is president, and she is here because of a fall.  She states she has 2 sisters. She lives with a significant other. She has no children.   She states she spends a lot of time watching tv on the couch at home.  We discussed her diagnosis, prognosis, and GOC.  We discussed advanced directives.  Concepts specific to code status, artifical feeding and hydration, IV antibiotics and rehospitalization were discussed.  The difference between an aggressive medical intervention path and a comfort care path was discussed.  Values and goals of care important to patient and family were attempted to be elicited.  She states she would never want a feeding tube, and would never want to be placed on a ventilator. She would like to continue other care. She states she would want CPR to attempt resusitation.   Per SW, DSS is following, but patient can decide on a decision maker. Sisters would be next of kin. Brandie states she would like her sister Karna Christmas to be her HPOA. Spiritual care consult placed to complete these papers.   Spoke with Terri at [phone] with patient's permission. She states her sister was admitted in March for toe amputation following complications for a foot wound. Please see those notes for further.   She was sent to a SNF, and her boyfriend told the facility he wanted her discharged. LEO was present at the facility upon boyfriend's arrival, and the SNF gave her the option to stay or D/C, and she chose to D/C. Because of this, she was D/C'd AMA and was not given prescriptions or [place] referrals. Terri states the patient's boyfriend goes to the [place], and has a Corporate investment banker which she used and fell out of x2. She was then brought to the hospital because with the 2 falls, that someone requested a welfare check. Karna Christmas states that the boyfriend will attempt to get her out of the hospital Friday, as that is when she will lose [facility] spot [facility]. She states the decisions her sister has made on [place] are consistent with what she believes her sister would want. They are also reflective of [place] conversation in previous admission.     SUMMARY OF RECOMMENDATIONS   Continue current care. She states she would not want a feeding tube or to be placed on a ventilator. She does want to CPR. She wants her  sister Karna Christmas to be her HPOA- spiritual care consult placed.   Will continue to follow.   Code Status/Advance Care Planning:  Full code  Prognosis:   Unable to determine  Discharge Planning: Recommend palliative at D/C.      Primary Diagnoses: Present on Admission: . Anxiety and depression . Essential hypertension . Hepatic cirrhosis ([place]) . HLD (hyperlipidemia) . Sepsis ([place]) . CKD (chronic kidney disease), stage IV ([place]) . UTI (urinary tract infection) . Elevated CK   I have reviewed the medical record, interviewed the patient and family, and examined the patient. The following aspects are pertinent.  Past Medical History:  Diagnosis Date  . Anxiety   . Arthritis   . Chronic pain   . Depression   . Diabetes mellitus without complication ([place])   . Drug abuse ([place])    History of polysubstance abuse; currently on methadone.  . Hepatitis    History of Hep "C". treated and cured with  Harvoni  . History of chicken pox   . Hypertension   . Panic attacks   . Peripheral vascular disease ([place])    stent in place.   Mar[place] Kitchen UTI (urinary tract infection)    History of   Social History   Socioeconomic History  . Marital status: Divorced    Spouse name: Not on file  . Number of children: Not on file  . Years of education: Not on file  . Highest education level: Not on file  Occupational History  . Not on file  Social Needs  . Financial resource strain: Not hard at all  . Food insecurity    Worry: Never true    Inability: Never true  . Transportation needs    Medical: No    Non-medical: No  Tobacco Use  . Smoking status: Former Smoker    Packs/day: 0.50    Years: 30.00    Pack years: 15.00    Types: Cigarettes    Quit date: 07/25/2018    Years since quitting: 0.4  . Smokeless tobacco: Never Used  . Tobacco comment: Currently smoking 1/2 ppd  Substance and Sexual Activity  . Alcohol use: No    Alcohol/week: 0.0 standard drinks  . Drug use: Not Currently    Comment: Hx of.  . Sexual activity: Not Currently  Lifestyle  . Physical activity    Days per week: 0 days    Minutes per session: Not on file  . Stress: Not on file  Relationships  . Social Herbalist on phone: Not on file    Gets together: Not on file    Attends religious service: Not on file    Active member of club or organization: Not on file    Attends meetings of clubs or organizations: Not on file    Relationship status: Not on file  Other Topics Concern  . Not on file  Social History Narrative   1 daughter died in 10-01-16    Lives at home not alone    Family History  Problem Relation Age of Onset  . Arthritis Mother   . Mental illness Mother        depression  . Diabetes Mother   . Cancer Mother        pancreatic  . Cancer Father        colon  . Heart disease Father   . Diabetes Father   . Cancer Daughter        lung   Scheduled Meds: .  aspirin EC  81 mg Oral Daily  .  clopidogrel  75 mg Oral Daily  . feeding supplement (NEPRO CARB STEADY)  237 mL Oral BID BM  . heparin  5,000 Units Subcutaneous Q8H  . insulin aspart  0-9 Units Subcutaneous TID WC  . multivitamin with minerals  1 tablet Oral Daily  . vitamin C  500 mg Oral BID   Continuous Infusions: . ceFEPime (MAXIPIME) IV    . potassium PHOSPHATE IVPB (in mmol) 30 mmol (12/24/18 1220)   PRN Meds:.acetaminophen **OR** acetaminophen, ondansetron **OR** ondansetron (ZOFRAN) IV Medications Prior to Admission:  Prior to Admission medications   Medication Sig Start Date End Date Taking? Authorizing Provider  acetaminophen (TYLENOL) 325 MG tablet Take 2 tablets (650 mg total) by mouth every 6 (six) hours as needed for mild pain (or Fever >/= 101). 11/03/18  Yes Stegmayer, Janalyn Harder, PA-C  aspirin 81 MG tablet Take 81 mg by mouth daily.   Yes [provider]  atorvastatin (LIPITOR) 40 MG tablet Take 1 tablet (40 mg total) by mouth daily. 04/10/18  Yes [name], [name], MD  cholecalciferol (VITAMIN D3) 25 MCG (1000 UT) tablet Take 1 tablet (1,000 Units total) by mouth daily. 11/04/18  Yes Stegmayer, Joelene Millin A, PA-C  docusate sodium (COLACE) 100 MG capsule Take 2 capsules (200 mg total) by mouth daily as needed for mild constipation or moderate constipation. 11/03/18  Yes Stegmayer, Janalyn Harder, PA-C  ferrous sulfate 325 (65 FE) MG tablet Take 325 mg by mouth daily.   Yes [provider]  lisinopril (PRINIVIL,ZESTRIL) 10 MG tablet Take 1 tablet (10 mg total) by mouth daily. 04/23/18  Yes [name], [name], MD  Multiple Vitamins-Iron (MULTIVITAMINS WITH IRON) TABS tablet Take 1 tablet by mouth daily. 11/04/18  Yes Stegmayer, Joelene Millin A, PA-C  polyethylene glycol (MIRALAX / GLYCOLAX) 17 g packet Take 17 g by mouth daily as needed for severe constipation. 11/03/18  Yes Stegmayer, Janalyn Harder, PA-C  vitamin C (VITAMIN C) 250 MG tablet Take 1 tablet (250 mg total) by mouth 2 (two) times  daily. 11/03/18  Yes Stegmayer, Joelene Millin A, PA-C  ARIPiprazole (ABILIFY) 2 MG tablet Take 1 tablet (2 mg total) by mouth at bedtime. Patient not taking: Reported on 12/23/2018 11/03/18   Stegmayer, Joelene Millin A, PA-C  clonazePAM (KLONOPIN) 1 MG tablet Take 1 tablet (1 mg total) by mouth 2 (two) times daily. Patient not taking: Reported on 12/23/2018 11/04/18   Stegmayer, Joelene Millin A, PA-C  clopidogrel (PLAVIX) 75 MG tablet TAKE 1 TABLET BY MOUTH ONCE DAILY Patient not taking: Reported on 12/23/2018 12/23/17   Schnier, Dolores Lory, MD  escitalopram (LEXAPRO) 5 MG tablet Take 1 tablet (5 mg total) by mouth daily. Patient not taking: Reported on 12/23/2018 11/04/18   Stegmayer, Joelene Millin A, PA-C  feeding supplement, GLUCERNA SHAKE, (GLUCERNA SHAKE) LIQD Take 237 mLs by mouth 2 (two) times daily between meals. Patient not taking: Reported on 12/23/2018 11/03/18   Stegmayer, Janalyn Harder, PA-C  methadone (DOLOPHINE) 10 MG/ML solution Take 8 mLs (80 mg total) by mouth daily. Patient not taking: Reported on 12/23/2018 11/04/18   Stegmayer, Joelene Millin A, PA-C  potassium chloride SA (K-DUR) 20 MEQ tablet Take 2 tablets (40 mEq total) by mouth daily. Patient not taking: Reported on 12/09/2018 11/04/18   Marcelle Overlie A, PA-C   No Known Allergies Review of Systems  All other systems reviewed and are negative.   Physical Exam Pulmonary:     Effort: Pulmonary effort is normal.  Neurological:     Mental Status: She is alert.     Vital Signs: BP (!) 135/55 (BP Location: Right Arm)   Pulse 73   Temp 97.7 F (36.5 C) (Oral)   Resp 18   Ht 5\' 6"  (1.676 m)   Wt 59 kg   SpO2 (!) 89%   BMI 20.99 kg/m  Pain Scale: 0-10   Pain Score: 0-No pain   SpO2: SpO2: (!) 89 % O2 Device:SpO2: (!) 89 % O2 Flow Rate: .   IO: Intake/output summary:   Intake/Output Summary (Last 24 hours) at 12/24/2018 1344 Last data filed at 12/24/2018 1004 Gross per 24 hour  Intake 860 ml  Output 1775 ml  Net -915 ml    LBM: Last BM  Date: (unknown) Baseline Weight: Weight: 66.8 kg Most recent weight: Weight: 59 kg     Palliative Assessment/Data:     Time In: 1:20 Time Out: 2:30 Time Total:70 min Greater than 50%  of this time was spent counseling and coordinating care related to the above assessment and plan.  Signed by: Asencion Gowda, NP   Please contact Palliative Medicine Team phone at [phone] for questions and concerns.  For individual provider: See Shea Evans

## 2018-12-24 NOTE — TOC Initial Note (Signed)
Transition of Care [facility]) - Initial/Assessment Note    Patient Details  Name: Yesenia Shepard MRN: [id number] Date of Birth: 1951/05/02  [facility] CM/SW Contact:    Shela Leff, LCSW Phone Number: 12/24/2018, 9:02 AM  Clinical Narrative:       Patient confused and so CSW contacted patient's significant other, Nelson Chimes to ask if patient had any family and explained why. Iona Beard asked multiple times why we could not speak to him and was hesitant to provide family contacts. He said that she has two sisters: Karna Christmas and Shirlean Mylar. The number for Terri: [phone] which was not in working order. The number for Robin: [phone] there was no answer and CSW left message. [place] has an open case on patient and her DSS caseworker is Adelfa Koh: [phone].        Expected Discharge Plan: Skilled Nursing Facility Barriers to Discharge: Continued Medical Work up   Patient Goals and CMS Choice        Expected Discharge Plan and Services Expected Discharge Plan: [place]                                              Prior Living Arrangements/Services   Lives with:: Significant Other Patient language and need for interpreter reviewed:: Yes        Need for Family Participation in Patient Care: Yes (Comment) Care giver support system in place?: No (comment)   Criminal Activity/Legal Involvement Pertinent to Current Situation/Hospitalization: No - Comment as needed  Activities of Daily Living      Permission Sought/Granted                  Emotional Assessment Appearance:: Appears stated age     Orientation: : Oriented to Self, Oriented to Situation Alcohol / Substance Use: Not Applicable Psych Involvement: No (comment)  Admission diagnosis:  Lower urinary tract infectious disease [N39.0] Elevated CK [R74.8] Altered mental status, unspecified altered mental status type [R41.82] Patient Active Problem  List   Diagnosis Date Noted  . Sepsis ([place]) 12/22/2018  . CKD (chronic kidney disease), stage IV ([place]) 12/22/2018  . UTI (urinary tract infection) 12/22/2018  . Elevated CK 12/22/2018  . Atherosclerotic peripheral vascular disease with ulceration ([place]) 10/24/2018  . Atherosclerosis of artery of extremity with ulceration ([place]) 10/24/2018  . Atherosclerosis of native arteries of extremity with rest pain ([place]) 10/01/2018  . Chronic midline low back pain with bilateral sciatica 04/23/2018  . Vitamin D deficiency 04/10/2018  . Swelling of limb 03/01/2018  . Atherosclerosis of native arteries of extremity with intermittent claudication ([place]) 01/03/2018  . Leg pain 12/12/2017  . S/P right rotator cuff repair 01/16/2017  . Preoperative examination 01/14/2017  . Right shoulder pain 09/06/2016  . Hepatic cirrhosis ([place]) 02/09/2016  . Chronic hepatitis C without hepatic coma ([place]) 11/09/2015  . HLD (hyperlipidemia) 08/20/2015  . Peripheral vascular disease ([place]) 08/12/2015  . Essential hypertension 08/12/2015  . DM type 2 (diabetes mellitus, type 2) ([place]) 04/19/2015  . Current smoker 03/15/2015  . History of recreational drug use 03/15/2015  . Osteoarthritis 03/15/2015  . Preventative health care 03/15/2015  . Ulcer of left lower leg, limited to breakdown of skin ([place]) 03/15/2015  . Anxiety and depression 08/25/2012   PCP:  [name], [name], MD Pharmacy:   [facility] [address], [place] -  [place] [address] Phone: [phone] Fax: [phone]     Social Determinants of Health (SDOH) Interventions    Readmission Risk Interventions No flowsheet data found.

## 2018-12-24 NOTE — TOC Progression Note (Signed)
Transition of Care [facility]) - Progression Note    Patient Details  Name: Yesenia Shepard MRN: [id number] Date of Birth: [birth year]  Transition [facility]) CM/SW Contact  Shela Leff, [place] Phone Number: 12/24/2018, 2:50 PM  Clinical Narrative:   CSW spoke with patient's sister: Con Memos via phone at [phone] regarding discharge planning. CSW made Con Memos aware of PT recommendation for short term rehab. Con Memos was in agreement with beginning a bedsearch. CSW explained the short term rehab process and that insurance auth would need to be obtained. Bed search initiated.     Expected Discharge Plan: Skilled Nursing Facility Barriers to Discharge: Continued Medical Work up  Expected Discharge Plan and Services Expected Discharge Plan: [place]                                               Social Determinants of Health (SDOH) Interventions    Readmission Risk Interventions No flowsheet data found.

## 2018-12-24 NOTE — Consult Note (Signed)
PHARMACY CONSULT NOTE - FOLLOW UP  Pharmacy Consult for Electrolyte Monitoring and Replacement   Recent Labs: Potassium (mmol/L)  Date Value  12/24/2018 3.1 (L)   Magnesium (mg/dL)  Date Value  12/24/2018 1.6 (L)   Calcium (mg/dL)  Date Value  12/24/2018 7.9 (L)   Albumin (g/dL)  Date Value  12/22/2018 2.9 (L)   Phosphorus (mg/dL)  Date Value  12/24/2018 1.8 (L)   Sodium (mmol/L)  Date Value  12/24/2018 141  02/04/2015 139   Corrected Ca: 8.78 mg/dL  Assessment: 68 year-old female with sepsis due to a UTI and noted electrolyte abnormalities with this morning's labs and a positive troponin. The patient has been unable to take oral KCl due to nausea  Goal of Therapy:  Potassium 3.7-5.1 mmol/L Phosphorous 2.5-4.6 mg/dL Magnesium 1.7-2.4 mg/dL  Plan:   Replace magnesium with 1 gram IV magnesium sulfate  Replace potassium and phosphate with IV potassium phosphate 30 mmol. This provides 44 mEq IV potassium  F/U electrolytes in am  Dallie Piles ,PharmD Clinical Pharmacist 12/24/2018 9:27 AM

## 2018-12-24 NOTE — Progress Notes (Addendum)
[place] at [place] NAME: Yesenia Shepard    MR#:  [id number]  DATE OF BIRTH:  1950/08/29  SUBJECTIVE:  CHIEF COMPLAINT:   Chief Complaint  Patient presents with  . Fall  . Altered Mental Status   No new complaint this morning .patient awake and alert and oriented x3 this morning.  Sitting up and having breakfast.   No fevers.     REVIEW OF SYSTEMS:  Review of Systems  Constitutional: Negative for chills and fever.  HENT: Negative for hearing loss and tinnitus.   Eyes: Negative for blurred vision and double vision.  Respiratory: Negative for cough, hemoptysis and shortness of breath.   Cardiovascular: Negative for chest pain and palpitations.  Gastrointestinal: Negative for heartburn and nausea.  Genitourinary: Negative for dysuria and urgency.  Musculoskeletal: Negative for myalgias and neck pain.  Skin: Negative for itching and rash.  Neurological: Negative for dizziness and headaches.  Psychiatric/Behavioral: Negative for depression and hallucinations.     DRUG ALLERGIES:  No Known Allergies VITALS:  Blood pressure (!) 125/52, pulse 81, temperature 99.3 F (37.4 C), temperature source Oral, resp. rate 16, height 5\' 6"  (1.676 m), weight 59 kg, SpO2 100 %. PHYSICAL EXAMINATION:   Physical Exam  Constitutional: She appears well-developed and well-nourished.  HENT:  Head: Normocephalic and atraumatic.  Right Ear: External ear normal.  Eyes: Pupils are equal, round, and reactive to light. Conjunctivae are normal. Right eye exhibits no discharge.  Neck: Normal range of motion. Neck supple. No tracheal deviation present.  Cardiovascular: Normal rate, regular rhythm and normal heart sounds.  Respiratory: Effort normal. No respiratory distress.  GI: Soft. Bowel sounds are normal. She exhibits no distension.  Musculoskeletal: Normal range of motion.        General: No edema.  Neurological: She is alert.  Pleasantly confused   Skin:  Unstageable sacral ulcer  Psychiatric: She has a normal mood and affect. Her behavior is normal.   LABORATORY PANEL:  Female CBC Recent Labs  Lab 12/24/18 0321  WBC 13.2*  HGB 7.6*  HCT 23.8*  PLT 379   ------------------------------------------------------------------------------------------------------------------ Chemistries  Recent Labs  Lab 12/22/18 1813  12/24/18 0321  NA 141   < > 141  K 3.5   < > 3.1*  CL 107   < > 109  CO2 20*   < > 23  GLUCOSE 145*   < > 133*  BUN 60*   < > 33*  CREATININE 2.15*   < > 1.17*  CALCIUM 8.9   < > 7.9*  MG  --   --  1.6*  AST 32  --   --   ALT 17  --   --   ALKPHOS 68  --   --   BILITOT 0.5  --   --    < > = values in this interval not displayed.   RADIOLOGY:  No results found. ASSESSMENT AND PLAN:   1. Sepsis secondary to urinary tract infection  Being treated with IV antibiotics.  IV fluids.  So far no growth on cultures.  Patient currently on IV vancomycin and cefepime.  Patient on broad-spectrum IV antibiotics with cefepime and vancomycin. We will initiate de-escalation of antibiotics by discontinuing vancomycin this morning.  Monitor clinically.    2.  Elevated CK.   Resolved with IV fluids.  3.  DM type 2 (diabetes mellitus, type 2) (HCC) -sliding scale insulin    4.Essential hypertension -home dose  antihypertensives   5. Anxiety and depression -continue home meds    6. HLD (hyperlipidemia) -home dose antilipid    7.CKD (chronic kidney disease), stage IV (HCC) -at baseline, avoid nephrotoxins  8.Stage II pressure injury to the right heel. Sacrum with unstageable ulcer.  Dark for for non-blanchable tissue over the sacrum with superficial skin peeling.  Seen by wound care nurse.  Plan is to add silicone foam to use and change every 3 days on PRN.  To add mattress replacement for moisture management and pressure redistribution.  Silicone foam in place for deep tissue pressure injury. Palliative care consult  placed. Patient seen by physical therapist.  Recommended skilled nursing facility placement on discharge.  9.  Hypokalemia, hypomagnesemia and hypophosphatemia Replacing electrolytes. Monitor closely due to risk for refeeding syndrome.  DVT prophylaxis; heparin   All the records are reviewed and case discussed with Care Management/Social Worker. Called patient's listed contact in the chart; friend; Iona Beard as requested by patient.  He expressed that he had a bad experience at the last nursing facility.  He will discuss further with patient regarding going to a different nursing facility on discharge from the hospital.  To follow-up with him tomorrow.  CODE STATUS: Full Code  TOTAL TIME TAKING CARE OF THIS PATIENT: 38 minutes.   More than 50% of the time was spent in counseling/coordination of care: YES  POSSIBLE D/C IN 2 DAYS, DEPENDING ON CLINICAL CONDITION.   Betty Daidone M.D on 12/24/2018 at 10:48 AM  Between 7am to 6pm - Pager - [phone]  After 6pm go to [email or web address] - Proofreader  [facility]  Office  [phone]  CC: Primary care physician; [name], [name], MD  Note: This dictation was prepared with Dragon dictation along with smaller phrase technology. Any transcriptional errors that result from this process are unintentional.

## 2018-12-24 NOTE — NC FL2 (Signed)
[place] LEVEL OF CARE SCREENING TOOL     IDENTIFICATION  Patient Name: Yesenia Shepard Birthdate: 09-13-1950 Sex: female Admission Date (Current Location): 12/22/2018  [facility] and [place] Number:  Engineering geologist and Address:         Provider Number: [id number]  Attending Physician Name and Address:  Otila Back, MD  Relative Name and Phone Number:       Current Level of Care: Hospital Recommended Level of Care: [place] Prior Approval Number:    Date Approved/Denied:   PASRR Number:    Discharge Plan: SNF    Current Diagnoses: Patient Active Problem List   Diagnosis Date Noted  . Pressure injury of skin 12/24/2018  . Sepsis ([place]) 12/22/2018  . CKD (chronic kidney disease), stage IV ([place]) 12/22/2018  . UTI (urinary tract infection) 12/22/2018  . Elevated CK 12/22/2018  . Atherosclerotic peripheral vascular disease with ulceration ([place]) 10/24/2018  . Atherosclerosis of artery of extremity with ulceration ([place]) 10/24/2018  . Atherosclerosis of native arteries of extremity with rest pain ([place]) 10/01/2018  . Chronic midline low back pain with bilateral sciatica 04/23/2018  . Vitamin D deficiency 04/10/2018  . Swelling of limb 03/01/2018  . Atherosclerosis of native arteries of extremity with intermittent claudication ([place]) 01/03/2018  . Leg pain 12/12/2017  . S/P right rotator cuff repair 01/16/2017  . Preoperative examination 01/14/2017  . Right shoulder pain 09/06/2016  . Hepatic cirrhosis ([place]) 02/09/2016  . Chronic hepatitis C without hepatic coma ([place]) 11/09/2015  . HLD (hyperlipidemia) 08/20/2015  . Peripheral vascular disease ([place]) 08/12/2015  . Essential hypertension 08/12/2015  . DM type 2 (diabetes mellitus, type 2) ([place]) 04/19/2015  . Current smoker 03/15/2015  . History of recreational drug use 03/15/2015  . Osteoarthritis 03/15/2015  . Preventative health care 03/15/2015  . Ulcer of left lower leg, limited to  breakdown of skin ([place]) 03/15/2015  . Anxiety and depression 08/25/2012    Orientation RESPIRATION BLADDER Height & Weight     Self, Place, Time  Normal Incontinent Weight: 130 lb 1.1 oz (59 kg) Height:  5\' 6"  (167.6 cm)  BEHAVIORAL SYMPTOMS/MOOD NEUROLOGICAL BOWEL NUTRITION STATUS  (none) (none) Incontinent Diet  AMBULATORY STATUS COMMUNICATION OF NEEDS Skin   Total Care Verbally PU Stage and Appropriate Care                       Personal Care Assistance Level of Assistance  Total care       Total Care Assistance: Maximum assistance   Functional Limitations Info  Sight Sight Info: Impaired        SPECIAL CARE FACTORS FREQUENCY  PT (By licensed PT)                    Contractures Contractures Info: Not present    Additional Factors Info  Code Status Code Status Info: full             Current Medications (12/24/2018):  This is the current hospital active medication list Current Facility-Administered Medications  Medication Dose Route Frequency Provider Last Rate Last Dose  . acetaminophen (TYLENOL) tablet 650 mg  650 mg Oral Q6H PRN Lance Coon, MD       Or  . acetaminophen (TYLENOL) suppository 650 mg  650 mg Rectal Q6H PRN Lance Coon, MD      . aspirin EC tablet 81 mg  81 mg Oral Daily Lance Coon, MD   81 mg at  12/23/18 1055  . ceFEPIme (MAXIPIME) 2 g in sodium chloride 0.9 % 100 mL IVPB  2 g Intravenous Q12H Dallie Piles, RPH      . clopidogrel (PLAVIX) tablet 75 mg  75 mg Oral Daily Lance Coon, MD   75 mg at 12/24/18 1430  . feeding supplement (NEPRO CARB STEADY) liquid 237 mL  237 mL Oral BID BM Ojie, Jude, MD   237 mL at 12/23/18 1424  . heparin injection 5,000 Units  5,000 Units Subcutaneous Q8H Lance Coon, MD   5,000 Units at 12/24/18 1431  . insulin aspart (novoLOG) injection 0-9 Units  0-9 Units Subcutaneous TID WC Lance Coon, MD   1 Units at 12/24/18 0850  . methadone (DOLOPHINE) tablet 80 mg  80 mg Oral Daily Ojie,  Jude, MD   80 mg at 12/24/18 1528  . multivitamin with minerals tablet 1 tablet  1 tablet Oral Daily Ojie, Jude, MD      . ondansetron (ZOFRAN) tablet 4 mg  4 mg Oral Q6H PRN Lance Coon, MD       Or  . ondansetron [facility]) injection 4 mg  4 mg Intravenous Q6H PRN Lance Coon, MD      . potassium PHOSPHATE 30 mmol in dextrose 5 % 500 mL infusion  30 mmol Intravenous Once Dallie Piles, RPH 85 mL/hr at 12/24/18 1220 30 mmol at 12/24/18 1220  . vitamin C (ASCORBIC ACID) tablet 500 mg  500 mg Oral BID Stark Jock, Jude, MD   500 mg at 12/23/18 2331     Discharge Medications: Please see discharge summary for a list of discharge medications.  Relevant Imaging Results:  Relevant Lab Results:   Additional Information [id number]  Shela Leff, LCSW

## 2018-12-24 NOTE — Progress Notes (Addendum)
Pharmacy Antibiotic Note  Yesenia Shepard is a 68 y.o. female admitted on 12/22/2018 with urosepsis and sacral wound infections.  Pharmacy has been consulted for vanc/cefepime dosing. Since admission her renal function has improved and her SCr is nearly to her baseline level. A random vancomycin level was drawn to ensure loading dose had returned to an acceptable level  Vancomycin random level: 8 mcg/mL 6/17 1057  Plan: 1) D/C vancomycin per Dr Stark Jock  2) increase cefepime to 2g IV q12h   Height: 5\' 6"  (167.6 cm) Weight: 130 lb 1.1 oz (59 kg) IBW/kg (Calculated) : 59.3  Temp (24hrs), Avg:99 F (37.2 C), Min:98.7 F (37.1 C), Max:99.3 F (37.4 C)  Recent Labs  Lab 12/22/18 1813 12/22/18 2334 12/23/18 0133 12/24/18 0321  WBC 18.4* 17.3* 16.9* 13.2*  CREATININE 2.15* 1.92* 1.87* 1.17*  LATICACIDVEN 1.6  --   --   --     Estimated Creatinine Clearance: 42.9 mL/min (A) (by C-G formula based on SCr of 1.17 mg/dL (H)).    Thank you for allowing pharmacy to be a part of this patient's care.  Vallery Sa, PharmD Clinical Pharmacist 12/24/2018 10:49 AM

## 2018-12-25 ENCOUNTER — Other Ambulatory Visit: Payer: Self-pay

## 2018-12-25 DIAGNOSIS — Z515 Encounter for palliative care: Secondary | ICD-10-CM

## 2018-12-25 LAB — CBC
HCT: 23.7 % — ABNORMAL LOW (ref 36.0–46.0)
Hemoglobin: 7.3 g/dL — ABNORMAL LOW (ref 12.0–15.0)
MCH: 27.1 pg (ref 26.0–34.0)
MCHC: 30.8 g/dL (ref 30.0–36.0)
MCV: 88.1 fL (ref 80.0–100.0)
Platelets: 357 10*3/uL (ref 150–400)
RBC: 2.69 MIL/uL — ABNORMAL LOW (ref 3.87–5.11)
RDW: 15 % (ref 11.5–15.5)
WBC: 12.3 10*3/uL — ABNORMAL HIGH (ref 4.0–10.5)
nRBC: 0 % (ref 0.0–0.2)

## 2018-12-25 LAB — BASIC METABOLIC PANEL
Anion gap: 7 (ref 5–15)
BUN: 22 mg/dL (ref 8–23)
CO2: 26 mmol/L (ref 22–32)
Calcium: 7.7 mg/dL — ABNORMAL LOW (ref 8.9–10.3)
Chloride: 106 mmol/L (ref 98–111)
Creatinine, Ser: 1.01 mg/dL — ABNORMAL HIGH (ref 0.44–1.00)
GFR calc Af Amer: >60 mL/min (ref >60)
GFR calc non Af Amer: 57 mL/min — ABNORMAL LOW (ref >60)
Glucose, Bld: 106 mg/dL — ABNORMAL HIGH (ref 70–99)
Potassium: 3.5 mmol/L (ref 3.5–5.1)
Sodium: 139 mmol/L (ref 135–145)

## 2018-12-25 LAB — MAGNESIUM: Magnesium: 1.7 mg/dL (ref 1.7–2.4)

## 2018-12-25 LAB — GLUCOSE, CAPILLARY
Glucose-Capillary: 103 mg/dL — ABNORMAL HIGH (ref 70–99)
Glucose-Capillary: 162 mg/dL — ABNORMAL HIGH (ref 70–99)
Glucose-Capillary: 100 mg/dL — ABNORMAL HIGH (ref 70–99)
Glucose-Capillary: 157 mg/dL — ABNORMAL HIGH (ref 70–99)

## 2018-12-25 LAB — PHOSPHORUS: Phosphorus: 2.9 mg/dL (ref 2.5–4.6)

## 2018-12-25 MED ORDER — METHADONE HCL 10 MG/ML PO CONC
80.0000 mg | Freq: Every day | ORAL | Status: DC
  Administered 2018-12-25 – 2018-12-26 (×2): 80 mg via ORAL
  Filled 2018-12-25 (×2): qty 8

## 2018-12-25 NOTE — Care Management Important Message (Signed)
Important Message  Patient Details  Name: Yesenia Shepard MRN: [id number] Date of Birth: 25-May-1951   Medicare Important Message Given:  Yes  Medicare IM left in patient's room. Reviewed verbally with patient's sister, Jeannine Boga, at [phone] in addition to patient.  Dannette Barbara 12/25/2018, 12:55 PM

## 2018-12-25 NOTE — Progress Notes (Signed)
   12/25/18 1500  Clinical Encounter Type  Visited With Patient;Family  Visit Type Follow-up  Referral From Family  Ch assited the pt in filling out the AD. Pt said, referring to the whole process of filling out the form, "It is depressing." Ch noticed distress in the pt. Pt feels down about her medical conditions. Ch encouraged the pt to share her feelings and the pt expressed her being stressed out. Ch also talked to sister Coralyn Mark who will be the pt's POA about pt's emotional status.  All the notaries are unavailable at the moment. Ch will attempt to get it notarized in an hour.

## 2018-12-25 NOTE — Consult Note (Signed)
PHARMACY CONSULT NOTE - FOLLOW UP  Pharmacy Consult for Electrolyte Monitoring and Replacement   Recent Labs: Potassium (mmol/L)  Date Value  12/25/2018 3.5   Magnesium (mg/dL)  Date Value  12/25/2018 1.7   Calcium (mg/dL)  Date Value  12/25/2018 7.7 (L)   Albumin (g/dL)  Date Value  12/22/2018 2.9 (L)   Phosphorus (mg/dL)  Date Value  12/25/2018 2.9   Sodium (mmol/L)  Date Value  12/25/2018 139  02/04/2015 139   Corrected Ca: 8.58 mg/dL  Assessment: 68 year-old female with sepsis due to a UTI and noted electrolyte abnormalities with this morning's labs and a positive troponin. The patient has been unable to take oral KCl due to nausea  Goal of Therapy:  Potassium 3.5-5.1 mmol/L Phosphorous 2.5-4.6 mg/dL Magnesium 1.7-2.4 mg/dL  Plan:   All electrolytes are at goal this am: no replacement warranted today  F/U electrolytes in am  Dallie Piles ,PharmD Clinical Pharmacist 12/25/2018 12:55 PM

## 2018-12-25 NOTE — Progress Notes (Addendum)
[place] [facility] [place] NAME: Yesenia Shepard    MR#:  [id number]  DATE OF BIRTH:  Jan 24, 1951  SUBJECTIVE:  CHIEF COMPLAINT:   Chief Complaint  Patient presents with  . Fall  . Altered Mental Status   No new complaint this morning .patient awake and alert and oriented x3 this morning.  Sitting up in bed .No fevers.     REVIEW OF SYSTEMS:  Review of Systems  Constitutional: Negative for chills and fever.  HENT: Negative for hearing loss and tinnitus.   Eyes: Negative for blurred vision and double vision.  Respiratory: Negative for cough, hemoptysis and shortness of breath.   Cardiovascular: Negative for chest pain and palpitations.  Gastrointestinal: Negative for heartburn and nausea.  Genitourinary: Negative for dysuria and urgency.  Musculoskeletal: Negative for myalgias and neck pain.  Skin: Negative for itching and rash.  Neurological: Negative for dizziness and headaches.  Psychiatric/Behavioral: Negative for depression and hallucinations.     DRUG ALLERGIES:  No Known Allergies VITALS:  Blood pressure (!) 112/55, pulse 75, temperature 97.7 F (36.5 C), temperature source Oral, resp. rate 18, height 5\' 6"  (1.676 m), weight 59 kg, SpO2 97 %. PHYSICAL EXAMINATION:   Physical Exam  Constitutional: She appears well-developed and well-nourished.  HENT:  Head: Normocephalic and atraumatic.  Right Ear: External ear normal.  Eyes: Pupils are equal, round, and reactive to light. Conjunctivae are normal. Right eye exhibits no discharge.  Neck: Normal range of motion. Neck supple. No tracheal deviation present.  Cardiovascular: Normal rate, regular rhythm and normal heart sounds.  Respiratory: Effort normal. No respiratory distress.  GI: Soft. Bowel sounds are normal. She exhibits no distension.  Musculoskeletal: Normal range of motion.        General: No edema.  Neurological: She is alert.  Pleasantly confused  Skin:  Unstageable  sacral ulcer  Psychiatric: She has a normal mood and affect. Her behavior is normal.   LABORATORY PANEL:  Female CBC Recent Labs  Lab 12/25/18 0313  WBC 12.3*  HGB 7.3*  HCT 23.7*  PLT 357   ------------------------------------------------------------------------------------------------------------------ Chemistries  Recent Labs  Lab 12/22/18 1813  12/25/18 0313  NA 141   < > 139  K 3.5   < > 3.5  CL 107   < > 106  CO2 20*   < > 26  GLUCOSE 145*   < > 106*  BUN 60*   < > 22  CREATININE 2.15*   < > 1.01*  CALCIUM 8.9   < > 7.7*  MG  --    < > 1.7  AST 32  --   --   ALT 17  --   --   ALKPHOS 68  --   --   BILITOT 0.5  --   --    < > = values in this interval not displayed.   RADIOLOGY:  No results found. ASSESSMENT AND PLAN:   1. Sepsis secondary to urinary tract infection  Being treated with IV antibiotics.  IV fluids.  So far no growth on cultures.  Patient initially placed on IV vancomycin and cefepime.  Vancomycin previously discontinued due to clinical improvement.  So far no growth on urine cultures.  Discontinue cefepime  2.  Elevated CK.   Resolved with IV fluids.  3.  DM type 2 (diabetes mellitus, type 2) (HCC) -sliding scale insulin    4.Essential hypertension -home dose antihypertensives   5. Anxiety and depression -continue  home meds    6. HLD (hyperlipidemia) -home dose antilipid    7.CKD (chronic kidney disease), stage IV (HCC) -at baseline, avoid nephrotoxins  8.Stage II pressure injury to the right heel. Sacrum with unstageable ulcer.  Dark for for non-blanchable tissue over the sacrum with superficial skin peeling.  Seen by wound care nurse.  Plan is to add silicone foam to use and change every 3 days on PRN.  To add mattress replacement for moisture management and pressure redistribution.  Silicone foam in place for deep tissue pressure injury. Palliative care consult placed. Patient seen by physical therapist.  Recommended skilled nursing  facility placement on discharge.  9.  Hypokalemia, hypomagnesemia and hypophosphatemia Replaced  DVT prophylaxis; heparin  Disposition; patient's case during rounds this morning.  Case manager already discussed with patient's sister and decision is for skilled nursing facility placement.  Case manager already working on placement.  All the records are reviewed and case discussed with Care Management/Social Worker. Called patient's sister; Yesenia Shepard [phone] and updated her on treatment plans today.  She agrees with plans for rehab placement on discharge from the hospital.  listed contact in the chart; friend; Yesenia Shepard as requested by patient yesterday.  He was updated on treatment plans  CODE STATUS: Full Code  TOTAL TIME TAKING CARE OF THIS PATIENT: 36 minutes.   More than 50% of the time was spent in counseling/coordination of care: YES  POSSIBLE D/C IN 1-2 DAYS, DEPENDING ON CLINICAL CONDITION.   Yesenia Shepard M.D on 12/25/2018 at 12:28 PM  Between 7am to 6pm - Pager - [phone]  After 6pm go to [email or web address] - Proofreader  [facility]  Office  [phone]  CC: Primary care physician; [name], [name], MD  Note: This dictation was prepared with Dragon dictation along with smaller phrase technology. Any transcriptional errors that result from this process are unintentional.

## 2018-12-25 NOTE — Progress Notes (Signed)
   12/25/18 1700  Clinical Encounter Type  Visited With Patient;Family  Visit Type Follow-up  Ch assisted the pt complete an AD. The notarized document can be found in the pt's chart. Pt showed a smile of relief upon the completion of the document. Ch sent a copy to the sister who is the POA by mail.

## 2018-12-25 NOTE — Progress Notes (Signed)
Daily Progress Note   Patient Name: Yesenia Shepard       Date: 12/25/2018 DOB: [birth year]  Age: 68 y.o. MRN#: [id number] Attending Physician: Otila Back, MD Primary Care Physician: [name], [name], MD Admit Date: 12/22/2018  Reason for Consultation/Follow-up: Psychosocial/spiritual support  Subjective: Patient is resting in bed. She smiles and is talkative today. She states she is feeling better today. She states she wants to do what she can to improve. She is not happy at the prospect of going to a SNF, but states she will if it will help her, she would be okay with this as at home she is not able to walk, and would like to be able to walk and improve her QOL. HPOA/ living will papers at bedside which have not been completed. Reviewed discussed [place] with no changes noted. She states she wants Terri to be her POA. Encouraged her to complete the forms per her wishes.   Length of Stay: 3  Current Medications: Scheduled Meds:  . aspirin EC  81 mg Oral Daily  . clopidogrel  75 mg Oral Daily  . feeding supplement (NEPRO CARB STEADY)  237 mL Oral BID BM  . heparin  5,000 Units Subcutaneous Q8H  . insulin aspart  0-9 Units Subcutaneous TID WC  . methadone  80 mg Oral Daily  . multivitamin with minerals  1 tablet Oral Daily  . vitamin C  500 mg Oral BID    Continuous Infusions:   PRN Meds: acetaminophen **OR** acetaminophen, ondansetron **OR** ondansetron (ZOFRAN) IV  Physical Exam Pulmonary:     Effort: Pulmonary effort is normal.  Neurological:     Mental Status: She is alert.             Vital Signs: BP (!) 112/55 (BP Location: Left Arm)   Pulse 75   Temp 97.7 F (36.5 C) (Oral)   Resp 18   Ht 5\' 6"  (1.676 m)   Wt 59 kg   SpO2 97%   BMI 20.99 kg/m  SpO2: SpO2: 97 %  O2 Device: O2 Device: Room Air O2 Flow Rate:    Intake/output summary:   Intake/Output Summary (Last 24 hours) at 12/25/2018 1407 Last data filed at 12/25/2018 1205 Gross per 24 hour  Intake 757.75 ml  Output 2350 ml  Net -1592.25 ml  LBM: Last BM Date: (unknown) Baseline Weight: Weight: 66.8 kg Most recent weight: Weight: 59 kg       Palliative Assessment/Data:      Patient Active Problem List   Diagnosis Date Noted  . Pressure injury of skin 12/24/2018  . Sepsis ([place]) 12/22/2018  . CKD (chronic kidney disease), stage IV ([place]) 12/22/2018  . UTI (urinary tract infection) 12/22/2018  . Elevated CK 12/22/2018  . Atherosclerotic peripheral vascular disease with ulceration ([place]) 10/24/2018  . Atherosclerosis of artery of extremity with ulceration ([place]) 10/24/2018  . Atherosclerosis of native arteries of extremity with rest pain ([place]) 10/01/2018  . Chronic midline low back pain with bilateral sciatica 04/23/2018  . Vitamin D deficiency 04/10/2018  . Swelling of limb 03/01/2018  . Atherosclerosis of native arteries of extremity with intermittent claudication ([place]) 01/03/2018  . Leg pain 12/12/2017  . S/P right rotator cuff repair 01/16/2017  . Preoperative examination 01/14/2017  . Right shoulder pain 09/06/2016  . Hepatic cirrhosis ([place]) 02/09/2016  . Chronic hepatitis C without hepatic coma ([place]) 11/09/2015  . HLD (hyperlipidemia) 08/20/2015  . Peripheral vascular disease ([place]) 08/12/2015  . Essential hypertension 08/12/2015  . DM type 2 (diabetes mellitus, type 2) ([place]) 04/19/2015  . Current smoker 03/15/2015  . History of recreational drug use 03/15/2015  . Osteoarthritis 03/15/2015  . Preventative health care 03/15/2015  . Ulcer of left lower leg, limited to breakdown of skin ([place]) 03/15/2015  . Anxiety and depression 08/25/2012    Palliative Care Assessment & Plan    Recommendations/Plan:  Recommend palliative at D/C.     Code Status:    Code Status  Orders  (From admission, onward)         Start     Ordered   12/22/18 2212  Full code  Continuous     12/22/18 2212        Code Status History    Date Active Date Inactive Code Status Order ID Comments User Context   10/24/2018 1504 11/04/2018 1901 Full Code [id number]  Algernon Huxley, MD Inpatient   12/17/2017 1504 12/17/2017 2020 Full Code [id number]  Katha Cabal, MD Inpatient   01/16/2017 1840 01/17/2017 2304 Full Code [id number]  Thornton Park, MD Inpatient   Advance Care Planning Activity       Prognosis:   Unable to determine  Discharge Planning:  To Be Determined    Thank you for allowing the Palliative Medicine Team to assist in the care of this patient.   Total Time 25 min Prolonged Time Billed  no      Greater than 50%  of this time was spent counseling and coordinating care related to the above assessment and plan.  Asencion Gowda, NP  Please contact Palliative Medicine Team phone at [phone] for questions and concerns.

## 2018-12-26 DIAGNOSIS — R41841 Cognitive communication deficit: Secondary | ICD-10-CM | POA: Diagnosis not present

## 2018-12-26 DIAGNOSIS — E1122 Type 2 diabetes mellitus with diabetic chronic kidney disease: Secondary | ICD-10-CM | POA: Diagnosis not present

## 2018-12-26 DIAGNOSIS — F329 Major depressive disorder, single episode, unspecified: Secondary | ICD-10-CM | POA: Diagnosis not present

## 2018-12-26 DIAGNOSIS — R319 Hematuria, unspecified: Secondary | ICD-10-CM | POA: Diagnosis not present

## 2018-12-26 DIAGNOSIS — Z8659 Personal history of other mental and behavioral disorders: Secondary | ICD-10-CM | POA: Diagnosis not present

## 2018-12-26 DIAGNOSIS — M6281 Muscle weakness (generalized): Secondary | ICD-10-CM | POA: Diagnosis not present

## 2018-12-26 DIAGNOSIS — F419 Anxiety disorder, unspecified: Secondary | ICD-10-CM | POA: Diagnosis not present

## 2018-12-26 DIAGNOSIS — E7800 Pure hypercholesterolemia, unspecified: Secondary | ICD-10-CM | POA: Diagnosis not present

## 2018-12-26 DIAGNOSIS — I10 Essential (primary) hypertension: Secondary | ICD-10-CM | POA: Diagnosis not present

## 2018-12-26 DIAGNOSIS — F0150 Vascular dementia without behavioral disturbance: Secondary | ICD-10-CM | POA: Diagnosis not present

## 2018-12-26 DIAGNOSIS — K5900 Constipation, unspecified: Secondary | ICD-10-CM | POA: Diagnosis not present

## 2018-12-26 DIAGNOSIS — L89120 Pressure ulcer of left upper back, unstageable: Secondary | ICD-10-CM | POA: Diagnosis not present

## 2018-12-26 DIAGNOSIS — N390 Urinary tract infection, site not specified: Secondary | ICD-10-CM | POA: Diagnosis not present

## 2018-12-26 DIAGNOSIS — Z7401 Bed confinement status: Secondary | ICD-10-CM | POA: Diagnosis not present

## 2018-12-26 DIAGNOSIS — R404 Transient alteration of awareness: Secondary | ICD-10-CM | POA: Diagnosis not present

## 2018-12-26 DIAGNOSIS — L97829 Non-pressure chronic ulcer of other part of left lower leg with unspecified severity: Secondary | ICD-10-CM | POA: Diagnosis not present

## 2018-12-26 DIAGNOSIS — Z20828 Contact with and (suspected) exposure to other viral communicable diseases: Secondary | ICD-10-CM | POA: Diagnosis not present

## 2018-12-26 DIAGNOSIS — F331 Major depressive disorder, recurrent, moderate: Secondary | ICD-10-CM | POA: Diagnosis not present

## 2018-12-26 DIAGNOSIS — R1314 Dysphagia, pharyngoesophageal phase: Secondary | ICD-10-CM | POA: Diagnosis not present

## 2018-12-26 DIAGNOSIS — W19XXXA Unspecified fall, initial encounter: Secondary | ICD-10-CM | POA: Diagnosis not present

## 2018-12-26 DIAGNOSIS — E119 Type 2 diabetes mellitus without complications: Secondary | ICD-10-CM | POA: Diagnosis not present

## 2018-12-26 DIAGNOSIS — L89610 Pressure ulcer of right heel, unstageable: Secondary | ICD-10-CM | POA: Diagnosis not present

## 2018-12-26 DIAGNOSIS — F1920 Other psychoactive substance dependence, uncomplicated: Secondary | ICD-10-CM | POA: Diagnosis not present

## 2018-12-26 DIAGNOSIS — L89150 Pressure ulcer of sacral region, unstageable: Secondary | ICD-10-CM | POA: Diagnosis not present

## 2018-12-26 DIAGNOSIS — K7460 Unspecified cirrhosis of liver: Secondary | ICD-10-CM | POA: Diagnosis not present

## 2018-12-26 DIAGNOSIS — R531 Weakness: Secondary | ICD-10-CM | POA: Diagnosis not present

## 2018-12-26 DIAGNOSIS — L97412 Non-pressure chronic ulcer of right heel and midfoot with fat layer exposed: Secondary | ICD-10-CM | POA: Diagnosis not present

## 2018-12-26 DIAGNOSIS — N184 Chronic kidney disease, stage 4 (severe): Secondary | ICD-10-CM | POA: Diagnosis not present

## 2018-12-26 DIAGNOSIS — R4182 Altered mental status, unspecified: Secondary | ICD-10-CM | POA: Diagnosis not present

## 2018-12-26 DIAGNOSIS — Z87898 Personal history of other specified conditions: Secondary | ICD-10-CM | POA: Diagnosis not present

## 2018-12-26 DIAGNOSIS — M2550 Pain in unspecified joint: Secondary | ICD-10-CM | POA: Diagnosis not present

## 2018-12-26 DIAGNOSIS — L97423 Non-pressure chronic ulcer of left heel and midfoot with necrosis of muscle: Secondary | ICD-10-CM | POA: Diagnosis not present

## 2018-12-26 DIAGNOSIS — R262 Difficulty in walking, not elsewhere classified: Secondary | ICD-10-CM | POA: Diagnosis not present

## 2018-12-26 DIAGNOSIS — L97229 Non-pressure chronic ulcer of left calf with unspecified severity: Secondary | ICD-10-CM | POA: Diagnosis not present

## 2018-12-26 DIAGNOSIS — G4700 Insomnia, unspecified: Secondary | ICD-10-CM | POA: Diagnosis not present

## 2018-12-26 DIAGNOSIS — L98499 Non-pressure chronic ulcer of skin of other sites with unspecified severity: Secondary | ICD-10-CM | POA: Diagnosis not present

## 2018-12-26 DIAGNOSIS — Z872 Personal history of diseases of the skin and subcutaneous tissue: Secondary | ICD-10-CM | POA: Diagnosis not present

## 2018-12-26 DIAGNOSIS — D649 Anemia, unspecified: Secondary | ICD-10-CM | POA: Diagnosis not present

## 2018-12-26 DIAGNOSIS — L97919 Non-pressure chronic ulcer of unspecified part of right lower leg with unspecified severity: Secondary | ICD-10-CM | POA: Diagnosis not present

## 2018-12-26 DIAGNOSIS — R410 Disorientation, unspecified: Secondary | ICD-10-CM | POA: Diagnosis not present

## 2018-12-26 DIAGNOSIS — L89154 Pressure ulcer of sacral region, stage 4: Secondary | ICD-10-CM | POA: Diagnosis not present

## 2018-12-26 DIAGNOSIS — R748 Abnormal levels of other serum enzymes: Secondary | ICD-10-CM | POA: Diagnosis not present

## 2018-12-26 DIAGNOSIS — A419 Sepsis, unspecified organism: Secondary | ICD-10-CM | POA: Diagnosis not present

## 2018-12-26 DIAGNOSIS — E785 Hyperlipidemia, unspecified: Secondary | ICD-10-CM | POA: Diagnosis not present

## 2018-12-26 DIAGNOSIS — F418 Other specified anxiety disorders: Secondary | ICD-10-CM | POA: Diagnosis not present

## 2018-12-26 LAB — GLUCOSE, CAPILLARY
Glucose-Capillary: 103 mg/dL — ABNORMAL HIGH (ref 70–99)
Glucose-Capillary: 187 mg/dL — ABNORMAL HIGH (ref 70–99)
Glucose-Capillary: 137 mg/dL — ABNORMAL HIGH (ref 70–99)

## 2018-12-26 LAB — ABO/RH: ABO/RH(D): A POS

## 2018-12-26 LAB — CBC
HCT: 22.9 % — ABNORMAL LOW (ref 36.0–46.0)
Hemoglobin: 7 g/dL — ABNORMAL LOW (ref 12.0–15.0)
MCH: 27.1 pg (ref 26.0–34.0)
MCHC: 30.6 g/dL (ref 30.0–36.0)
MCV: 88.8 fL (ref 80.0–100.0)
Platelets: 344 10*3/uL (ref 150–400)
RBC: 2.58 MIL/uL — ABNORMAL LOW (ref 3.87–5.11)
RDW: 14.8 % (ref 11.5–15.5)
WBC: 10 10*3/uL (ref 4.0–10.5)
nRBC: 0 % (ref 0.0–0.2)

## 2018-12-26 LAB — BASIC METABOLIC PANEL
Anion gap: 7 (ref 5–15)
BUN: 19 mg/dL (ref 8–23)
CO2: 27 mmol/L (ref 22–32)
Calcium: 7.8 mg/dL — ABNORMAL LOW (ref 8.9–10.3)
Chloride: 105 mmol/L (ref 98–111)
Creatinine, Ser: 1.06 mg/dL — ABNORMAL HIGH (ref 0.44–1.00)
GFR calc Af Amer: >60 mL/min (ref >60)
GFR calc non Af Amer: 54 mL/min — ABNORMAL LOW (ref >60)
Glucose, Bld: 106 mg/dL — ABNORMAL HIGH (ref 70–99)
Potassium: 3.7 mmol/L (ref 3.5–5.1)
Sodium: 139 mmol/L (ref 135–145)

## 2018-12-26 LAB — HEMOGLOBIN AND HEMATOCRIT, BLOOD
HCT: 27.8 % — ABNORMAL LOW (ref 36.0–46.0)
Hemoglobin: 8.8 g/dL — ABNORMAL LOW (ref 12.0–15.0)

## 2018-12-26 LAB — PHOSPHORUS: Phosphorus: 2.7 mg/dL (ref 2.5–4.6)

## 2018-12-26 LAB — MAGNESIUM: Magnesium: 1.7 mg/dL (ref 1.7–2.4)

## 2018-12-26 LAB — PREPARE RBC (CROSSMATCH)

## 2018-12-26 MED ORDER — ADULT MULTIVITAMIN LIQUID CH
15.0000 mL | Freq: Every day | ORAL | Status: DC
  Administered 2018-12-26: 15 mL via ORAL
  Filled 2018-12-26: qty 15

## 2018-12-26 MED ORDER — SODIUM CHLORIDE 0.9% IV SOLUTION
Freq: Once | INTRAVENOUS | Status: AC
  Administered 2018-12-26: 12:00:00 via INTRAVENOUS

## 2018-12-26 MED ORDER — ENSURE ENLIVE PO LIQD
237.0000 mL | Freq: Two times a day (BID) | ORAL | Status: DC
  Administered 2018-12-26: 237 mL via ORAL

## 2018-12-26 NOTE — TOC Progression Note (Signed)
Transition of Care [facility]) - Progression Note    Patient Details  Name: BRIGETTA BECKSTROM MRN: [id number] Date of Birth: October 27, 1950  [facility] CM/SW Contact  Shela Leff, [place] Phone Number: 12/26/2018, 9:42 AM  Clinical Narrative:    CSW spoke with patient's sister,Teri, and extended the bed offer from [facility]. [facility] is in agreement with this and CSW explained that we would now wait for prior auth from [place].     Expected Discharge Plan: Skilled Nursing Facility Barriers to Discharge: Continued Medical Work up  Expected Discharge Plan and Services Expected Discharge Plan: [place]         Expected Discharge Date: 12/27/18                                     Social Determinants of Health (SDOH) Interventions    Readmission Risk Interventions No flowsheet data found.

## 2018-12-26 NOTE — Progress Notes (Signed)
PT Cancellation Note  Patient Details Name: Yesenia Shepard MRN: [id number] DOB: [birth year]   Cancelled Treatment:    Reason Eval/Treat Not Completed: Other (comment)(Author arrived for PT treatment; RN reports patient is being prepped for DC this date, should leave soon. Will hold treatment at this time as not to interfere with transition of care.)  5:00 PM, 12/26/18 Etta Grandchild, PT, DPT Physical Therapist - [facility]  [phone] ([place])    [name] C 12/26/2018, 5:00 PM

## 2018-12-26 NOTE — Discharge Summary (Signed)
[facility] NAME: Yesenia Shepard    MR#:  [id number]  DATE OF BIRTH:  Nov 14, 1950  DATE OF ADMISSION:  12/22/2018   ADMITTING PHYSICIAN: Lance Coon, MD  DATE OF DISCHARGE: No discharge date for patient encounter.  PRIMARY CARE PHYSICIAN: [name], [name], MD   ADMISSION DIAGNOSIS:  Lower urinary tract infectious disease [N39.0] Elevated CK [R74.8] Altered mental status, unspecified altered mental status type [R41.82] DISCHARGE DIAGNOSIS:  Principal Problem:   Sepsis ([place]) Active Problems:   Anxiety and depression   DM type 2 (diabetes mellitus, type 2) ([place])   Essential hypertension   HLD (hyperlipidemia)   Hepatic cirrhosis (HCC)   CKD (chronic kidney disease), stage IV (HCC)   UTI (urinary tract infection)   Elevated CK   Pressure injury of skin  SECONDARY DIAGNOSIS:   Past Medical History:  Diagnosis Date  . Anxiety   . Arthritis   . Chronic pain   . Depression   . Diabetes mellitus without complication ([place])   . Drug abuse ([place])    History of polysubstance abuse; currently on methadone.  . Hepatitis    History of Hep "C". treated and cured with Harvoni  . History of chicken pox   . Hypertension   . Panic attacks   . Peripheral vascular disease ([place])    stent in place.   Marland Kitchen UTI (urinary tract infection)    History of   HOSPITAL COURSE:  Chief complaint; altered mental status  History of presenting complaint; Yesenia Shepard  is a 68 y.o. female who presented to the ED after a fall at home.  She was significantly confused and does not contribute any information to her HPI.  On evaluation here she is found to meet sepsis criteria and has a UTI.  She also has a sacral ulcer .  Please refer to the H&P dictated for further details.  Hospital course; 1. Sepsis secondary to urinary tract infection  Patient adequately hydrated with IV fluids.  Was adequately treated with broad-spectrum IV antibiotics with  vancomycin and cefepime.  No growth on cultures so far.  Antibiotics discontinued prior to discharge.  Patient remains afebrile.  2.  Elevated CK.   Resolved with IV fluids.  3.DM type 2 (diabetes mellitus, type 2) ([place]) -outpatient monitoring by MD at rehab  4.Essential hypertension -resume home dose antihypertensives  5.Anxiety and depression -continue home meds  6. HLD (hyperlipidemia) -home dose antilipid  7.CKD (chronic kidney disease), stage IV (HCC) -at baseline, avoid nephrotoxins  8.Stage II pressure injury to the right heel. Sacrum with unstageable ulcer.  Dark for for non-blanchable tissue over the sacrum with superficial skin peeling.  Seen by wound care nurse.  Plan is to add silicone foam to use and change every 3 days on PRN.    Wound care nurse added mattress replacement for moisture management and pressure redistribution.  Silicone foam in place for deep tissue pressure injury. Palliative care consult placed.  To continue current level of care.  Patient seen by physical therapist.  Recommended skilled nursing facility placement on discharge.  9.  Hypokalemia, hypomagnesemia and hypophosphatemia Replaced   DISCHARGE CONDITIONS:  Stable CONSULTS OBTAINED:   DRUG ALLERGIES:  No Known Allergies DISCHARGE MEDICATIONS:   Allergies as of 12/26/2018   No Known Allergies     Medication List    STOP taking these medications   clonazePAM 1 MG tablet Commonly known as: KLONOPIN     TAKE  these medications   acetaminophen 325 MG tablet Commonly known as: TYLENOL Take 2 tablets (650 mg total) by mouth every 6 (six) hours as needed for mild pain (or Fever >/= 101).   ARIPiprazole 2 MG tablet Commonly known as: ABILIFY Take 1 tablet (2 mg total) by mouth at bedtime.   ascorbic acid 250 MG tablet Commonly known as: VITAMIN C Take 1 tablet (250 mg total) by mouth 2 (two) times daily.   aspirin 81 MG tablet Take 81 mg by mouth daily.   atorvastatin  40 MG tablet Commonly known as: LIPITOR Take 1 tablet (40 mg total) by mouth daily.   cholecalciferol 25 MCG (1000 UT) tablet Commonly known as: VITAMIN D3 Take 1 tablet (1,000 Units total) by mouth daily.   clopidogrel 75 MG tablet Commonly known as: PLAVIX TAKE 1 TABLET BY MOUTH ONCE DAILY   docusate sodium 100 MG capsule Commonly known as: COLACE Take 2 capsules (200 mg total) by mouth daily as needed for mild constipation or moderate constipation.   escitalopram 5 MG tablet Commonly known as: LEXAPRO Take 1 tablet (5 mg total) by mouth daily.   feeding supplement (GLUCERNA SHAKE) Liqd Take 237 mLs by mouth 2 (two) times daily between meals.   ferrous sulfate 325 (65 FE) MG tablet Take 325 mg by mouth daily.   lisinopril 10 MG tablet Commonly known as: ZESTRIL Take 1 tablet (10 mg total) by mouth daily.   methadone 10 MG/ML solution Commonly known as: DOLOPHINE Take 8 mLs (80 mg total) by mouth daily.   multivitamins with iron Tabs tablet Take 1 tablet by mouth daily.   polyethylene glycol 17 g packet Commonly known as: MIRALAX / GLYCOLAX Take 17 g by mouth daily as needed for severe constipation.   potassium chloride SA 20 MEQ tablet Commonly known as: K-DUR Take 2 tablets (40 mEq total) by mouth daily.        DISCHARGE INSTRUCTIONS:   DIET:  Diabetic diet DISCHARGE CONDITION:  Stable ACTIVITY:  Out of bed with assistance OXYGEN:  Home Oxygen: No.  Oxygen Delivery: room air DISCHARGE LOCATION:  nursing home   If you experience worsening of your admission symptoms, develop shortness of breath, life threatening emergency, suicidal or homicidal thoughts you must seek medical attention immediately by calling 911 or calling your MD immediately  if symptoms less severe.  You Must read complete instructions/literature along with all the possible adverse reactions/side effects for all the Medicines you take and that have been prescribed to you. Take any  new Medicines after you have completely understood and accpet all the possible adverse reactions/side effects.   Please note  You were cared for by a hospitalist during your hospital stay. If you have any questions about your discharge medications or the care you received while you were in the hospital after you are discharged, you can call the unit and asked to speak with the hospitalist on call if the hospitalist that took care of you is not available. Once you are discharged, your primary care physician will handle any further medical issues. Please note that NO REFILLS for any discharge medications will be authorized once you are discharged, as it is imperative that you return to your primary care physician (or establish a relationship with a primary care physician if you do not have one) for your aftercare needs so that they can reassess your need for medications and monitor your lab values.    On the day of Discharge:  VITAL  SIGNS:  Blood pressure (!) 121/47, pulse 88, temperature 98.5 F (36.9 C), temperature source Oral, resp. rate 17, height 5\' 6"  (1.676 m), weight 59 kg, SpO2 98 %. PHYSICAL EXAMINATION:   Physical Exam  Constitutional: She appears well-developed and well-nourished.  HENT:  Head: Normocephalic and atraumatic.  Right Ear: External ear normal.  Eyes: Pupils are equal, round, and reactive to light. Conjunctivae are normal. Right eye exhibits no discharge.  Neck: Normal range of motion. Neck supple. No tracheal deviation present.  Cardiovascular: Normal rate, regular rhythm and normal heart sounds.  Respiratory: Effort normal. No respiratory distress.  GI: Soft. Bowel sounds are normal. She exhibits no distension.  Musculoskeletal: Normal range of motion.     Extremity: No edema.  No cyanosis Neurological: She is alert and alert and oriented this morning.  Skin: Unstageable sacral ulcer  Psychiatric: She has a normal mood and affect. Her behavior is normal.  DATA  REVIEW:   CBC Recent Labs  Lab 12/26/18 0456  WBC 10.0  HGB 7.0*  HCT 22.9*  PLT 344    Chemistries  Recent Labs  Lab 12/22/18 1813  12/26/18 0456  NA 141   < > 139  K 3.5   < > 3.7  CL 107   < > 105  CO2 20*   < > 27  GLUCOSE 145*   < > 106*  BUN 60*   < > 19  CREATININE 2.15*   < > 1.06*  CALCIUM 8.9   < > 7.8*  MG  --    < > 1.7  AST 32  --   --   ALT 17  --   --   ALKPHOS 68  --   --   BILITOT 0.5  --   --    < > = values in this interval not displayed.     Microbiology Results  Results for orders placed or performed during the hospital encounter of 12/22/18  Urine Culture     Status: None   Collection Time: 12/22/18  7:22 PM   Specimen: Urine, Random  Result Value Ref Range Status   Specimen Description   Final    URINE, RANDOM Performed at [facility], [address], [place], [place] [address]    Special Requests   Final    NONE Performed at [facility], [address] [place], [place] [address]    Culture   Final    NO GROWTH Performed at [facility], [place] [address] [place], [place] [address]    Report Status 12/24/2018 FINAL  Final  Novel Coronavirus,NAA,(SEND-OUT TO REF LAB - TAT 24-48 hrs); Hosp Order     Status: None   Collection Time: 12/22/18  8:00 PM   Specimen: Nasopharyngeal Swab; Respiratory  Result Value Ref Range Status   SARS-CoV-2, NAA NOT DETECTED NOT DETECTED Final    Comment: (NOTE) This test was developed and its performance characteristics determined by [employer]. This test has not been FDA cleared or approved. This test has been authorized by FDA under an Emergency Use Authorization (EUA). This test is only authorized for the duration of time the declaration that circumstances exist justifying the authorization of the emergency use of in vitro diagnostic tests for detection of SARS-CoV-2 virus and/or diagnosis of COVID-19 infection under section 564(b)(1) of the Act, 21 U.S.C.  740CXK-4(Y)(1), unless the authorization is terminated or revoked sooner. When diagnostic testing is negative, the possibility of a false negative result  should be considered in the context of a patient's recent exposures and the presence of clinical signs and symptoms consistent with COVID-19. An individual without symptoms of COVID-19 and who is not shedding SARS-CoV-2 virus would expect to have a negative (not detected) result in this assay. Performed  At: [facility] [address] [place], [place] [id number] Rush Farmer MD RJ:[id number]    [place]  Final    Comment: Performed at [facility], [address] [place], [place] [address]  Culture, blood (Routine X 2) w Reflex to ID Panel     Status: None (Preliminary result)   Collection Time: 12/22/18 11:33 PM   Specimen: BLOOD  Result Value Ref Range Status   Specimen Description BLOOD RIGHT ANTECUBITAL  Final   Special Requests   Final    BOTTLES DRAWN AEROBIC AND ANAEROBIC Blood Culture adequate volume   Culture   Final    NO GROWTH 4 DAYS Performed at [facility], [address], [place], [place] [address]    Report Status PENDING  Incomplete  Culture, blood (Routine X 2) w Reflex to ID Panel     Status: None (Preliminary result)   Collection Time: 12/22/18 11:34 PM   Specimen: BLOOD  Result Value Ref Range Status   Specimen Description BLOOD BLOOD RIGHT WRIST  Final   Special Requests   Final    BOTTLES DRAWN AEROBIC AND ANAEROBIC Blood Culture adequate volume   Culture   Final    NO GROWTH 4 DAYS Performed at [facility], [address] [place], [place] [address]    Report Status PENDING  Incomplete    RADIOLOGY:  No results found.   Management plans discussed with the patient, family and they are in agreement.  CODE STATUS: Full Code   TOTAL TIME TAKING CARE OF THIS PATIENT: 38 minutes.    Nels Munn M.D on 12/26/2018 at 1:51 PM   Between 7am to 6pm - Pager - [phone]  After 6pm go to [email or web address] - Proofreader  [facility]  Office  [phone]  CC: Primary care physician; [name], [name], MD   Note: This dictation was prepared with Dragon dictation along with smaller phrase technology. Any transcriptional errors that result from this process are unintentional.

## 2018-12-26 NOTE — Progress Notes (Signed)
Yesenia Shepard to be D/C'd to [facility] per MD order.  Discussed prescriptions and follow up appointments with the patient. Prescriptions given to patient, medication list explained in detail. Pt verbalized understanding.  Allergies as of 12/26/2018   No Known Allergies     Medication List    STOP taking these medications   clonazePAM 1 MG tablet Commonly known as: KLONOPIN     TAKE these medications   acetaminophen 325 MG tablet Commonly known as: TYLENOL Take 2 tablets (650 mg total) by mouth every 6 (six) hours as needed for mild pain (or Fever >/= 101).   ARIPiprazole 2 MG tablet Commonly known as: ABILIFY Take 1 tablet (2 mg total) by mouth at bedtime.   ascorbic acid 250 MG tablet Commonly known as: VITAMIN C Take 1 tablet (250 mg total) by mouth 2 (two) times daily.   aspirin 81 MG tablet Take 81 mg by mouth daily.   atorvastatin 40 MG tablet Commonly known as: LIPITOR Take 1 tablet (40 mg total) by mouth daily.   cholecalciferol 25 MCG (1000 UT) tablet Commonly known as: VITAMIN D3 Take 1 tablet (1,000 Units total) by mouth daily.   clopidogrel 75 MG tablet Commonly known as: PLAVIX TAKE 1 TABLET BY MOUTH ONCE DAILY   docusate sodium 100 MG capsule Commonly known as: COLACE Take 2 capsules (200 mg total) by mouth daily as needed for mild constipation or moderate constipation.   escitalopram 5 MG tablet Commonly known as: LEXAPRO Take 1 tablet (5 mg total) by mouth daily.   feeding supplement (GLUCERNA SHAKE) Liqd Take 237 mLs by mouth 2 (two) times daily between meals.   ferrous sulfate 325 (65 FE) MG tablet Take 325 mg by mouth daily.   lisinopril 10 MG tablet Commonly known as: ZESTRIL Take 1 tablet (10 mg total) by mouth daily.   methadone 10 MG/ML solution Commonly known as: DOLOPHINE Take 8 mLs (80 mg total) by mouth daily.   multivitamins with iron Tabs tablet Take 1 tablet by mouth daily.   polyethylene glycol 17 g packet Commonly known as:  MIRALAX / GLYCOLAX Take 17 g by mouth daily as needed for severe constipation.   potassium chloride SA 20 MEQ tablet Commonly known as: K-DUR Take 2 tablets (40 mEq total) by mouth daily.       Vitals:   12/26/18 1244 12/26/18 1514  BP: (!) 121/47 124/64  Pulse: 88 84  Resp: 17 16  Temp: 98.5 F (36.9 C) 98.7 F (37.1 C)  SpO2: 98% 98%    Skin clean, dry and intact without evidence of skin break down, no evidence of skin tears noted. IV catheter discontinued intact. Site without signs and symptoms of complications. Dressing and pressure applied. Pt denies pain at this time. No complaints noted.  Report called to [facility]. No new scripts. Confirmed with team that patient will be able to obtain methadone. HGB re check was 8.8. EMS for transport. Patient had foley removed at 1pm. Per [facility] they have noted that they need to bladder scan and possibly insert foley at 9pm if patient has not yet voided.  Maisie Hauser A Dennie Moltz

## 2018-12-27 LAB — TYPE AND SCREEN
ABO/RH(D): A POS
Antibody Screen: NEGATIVE
Unit division: 0

## 2018-12-27 LAB — CULTURE, BLOOD (ROUTINE X 2)
Culture: NO GROWTH
Culture: NO GROWTH
Special Requests: ADEQUATE
Special Requests: ADEQUATE

## 2018-12-27 LAB — BPAM RBC
Blood Product Expiration Date: 202006232359
ISSUE DATE / TIME: 202006191221
Unit Type and Rh: 600

## 2018-12-30 DIAGNOSIS — F418 Other specified anxiety disorders: Secondary | ICD-10-CM | POA: Diagnosis not present

## 2018-12-30 DIAGNOSIS — N390 Urinary tract infection, site not specified: Secondary | ICD-10-CM | POA: Diagnosis not present

## 2018-12-30 DIAGNOSIS — I10 Essential (primary) hypertension: Secondary | ICD-10-CM | POA: Diagnosis not present

## 2018-12-30 DIAGNOSIS — L89610 Pressure ulcer of right heel, unstageable: Secondary | ICD-10-CM | POA: Diagnosis not present

## 2018-12-30 DIAGNOSIS — K5900 Constipation, unspecified: Secondary | ICD-10-CM | POA: Diagnosis not present

## 2018-12-30 DIAGNOSIS — Z8659 Personal history of other mental and behavioral disorders: Secondary | ICD-10-CM | POA: Diagnosis not present

## 2018-12-30 DIAGNOSIS — R4182 Altered mental status, unspecified: Secondary | ICD-10-CM | POA: Diagnosis not present

## 2018-12-30 DIAGNOSIS — E785 Hyperlipidemia, unspecified: Secondary | ICD-10-CM | POA: Diagnosis not present

## 2019-01-02 DIAGNOSIS — Z872 Personal history of diseases of the skin and subcutaneous tissue: Secondary | ICD-10-CM | POA: Diagnosis not present

## 2019-01-02 DIAGNOSIS — L89154 Pressure ulcer of sacral region, stage 4: Secondary | ICD-10-CM | POA: Diagnosis not present

## 2019-01-02 DIAGNOSIS — L97412 Non-pressure chronic ulcer of right heel and midfoot with fat layer exposed: Secondary | ICD-10-CM | POA: Diagnosis not present

## 2019-01-02 DIAGNOSIS — L97919 Non-pressure chronic ulcer of unspecified part of right lower leg with unspecified severity: Secondary | ICD-10-CM | POA: Diagnosis not present

## 2019-01-02 DIAGNOSIS — L97229 Non-pressure chronic ulcer of left calf with unspecified severity: Secondary | ICD-10-CM | POA: Diagnosis not present

## 2019-01-02 DIAGNOSIS — L89120 Pressure ulcer of left upper back, unstageable: Secondary | ICD-10-CM | POA: Diagnosis not present

## 2019-01-05 DIAGNOSIS — K7460 Unspecified cirrhosis of liver: Secondary | ICD-10-CM | POA: Diagnosis not present

## 2019-01-05 DIAGNOSIS — F331 Major depressive disorder, recurrent, moderate: Secondary | ICD-10-CM | POA: Diagnosis not present

## 2019-01-05 DIAGNOSIS — G4700 Insomnia, unspecified: Secondary | ICD-10-CM | POA: Diagnosis not present

## 2019-01-05 DIAGNOSIS — N184 Chronic kidney disease, stage 4 (severe): Secondary | ICD-10-CM | POA: Diagnosis not present

## 2019-01-05 DIAGNOSIS — E1122 Type 2 diabetes mellitus with diabetic chronic kidney disease: Secondary | ICD-10-CM | POA: Diagnosis not present

## 2019-01-05 DIAGNOSIS — F0150 Vascular dementia without behavioral disturbance: Secondary | ICD-10-CM | POA: Diagnosis not present

## 2019-01-05 DIAGNOSIS — F419 Anxiety disorder, unspecified: Secondary | ICD-10-CM | POA: Diagnosis not present

## 2019-01-08 ENCOUNTER — Other Ambulatory Visit
Admission: RE | Admit: 2019-01-08 | Discharge: 2019-01-08 | Disposition: A | Discharge status: Home / Self Care | Payer: No Typology Code available for payment source | Source: Ambulatory Visit | Attending: Family Medicine | Admitting: Family Medicine

## 2019-01-08 DIAGNOSIS — F419 Anxiety disorder, unspecified: Secondary | ICD-10-CM | POA: Diagnosis not present

## 2019-01-08 DIAGNOSIS — R410 Disorientation, unspecified: Secondary | ICD-10-CM | POA: Diagnosis not present

## 2019-01-08 DIAGNOSIS — K5900 Constipation, unspecified: Secondary | ICD-10-CM | POA: Diagnosis not present

## 2019-01-08 DIAGNOSIS — I10 Essential (primary) hypertension: Secondary | ICD-10-CM | POA: Diagnosis not present

## 2019-01-08 DIAGNOSIS — F329 Major depressive disorder, single episode, unspecified: Secondary | ICD-10-CM | POA: Diagnosis not present

## 2019-01-08 DIAGNOSIS — E785 Hyperlipidemia, unspecified: Secondary | ICD-10-CM | POA: Diagnosis not present

## 2019-01-08 DIAGNOSIS — L89610 Pressure ulcer of right heel, unstageable: Secondary | ICD-10-CM | POA: Diagnosis not present

## 2019-01-08 DIAGNOSIS — D649 Anemia, unspecified: Secondary | ICD-10-CM | POA: Insufficient documentation

## 2019-01-08 DIAGNOSIS — Z8659 Personal history of other mental and behavioral disorders: Secondary | ICD-10-CM | POA: Diagnosis not present

## 2019-01-08 LAB — CBC WITH DIFFERENTIAL/PLATELET
Abs Immature Granulocytes: 0.05 10*3/uL (ref 0.00–0.07)
Basophils Absolute: 0 10*3/uL (ref 0.0–0.1)
Basophils Relative: 0 %
Eosinophils Absolute: 0.2 10*3/uL (ref 0.0–0.5)
Eosinophils Relative: 2 %
HCT: 25.6 % — ABNORMAL LOW (ref 36.0–46.0)
Hemoglobin: 7.4 g/dL — ABNORMAL LOW (ref 12.0–15.0)
Immature Granulocytes: 1 %
Lymphocytes Relative: 14 %
Lymphs Abs: 1.4 10*3/uL (ref 0.7–4.0)
MCH: 28.9 pg (ref 26.0–34.0)
MCHC: 28.9 g/dL — ABNORMAL LOW (ref 30.0–36.0)
MCV: 100 fL (ref 80.0–100.0)
Monocytes Absolute: 0.7 10*3/uL (ref 0.1–1.0)
Monocytes Relative: 8 %
Neutro Abs: 7.4 10*3/uL (ref 1.7–7.7)
Neutrophils Relative %: 75 %
Platelets: 462 10*3/uL — ABNORMAL HIGH (ref 150–400)
RBC: 2.56 MIL/uL — ABNORMAL LOW (ref 3.87–5.11)
RDW: 16.3 % — ABNORMAL HIGH (ref 11.5–15.5)
WBC: 9.8 10*3/uL (ref 4.0–10.5)
nRBC: 0 % (ref 0.0–0.2)

## 2019-01-09 DIAGNOSIS — L97919 Non-pressure chronic ulcer of unspecified part of right lower leg with unspecified severity: Secondary | ICD-10-CM | POA: Diagnosis not present

## 2019-01-09 DIAGNOSIS — L89154 Pressure ulcer of sacral region, stage 4: Secondary | ICD-10-CM | POA: Diagnosis not present

## 2019-01-13 DIAGNOSIS — F419 Anxiety disorder, unspecified: Secondary | ICD-10-CM | POA: Diagnosis not present

## 2019-01-13 DIAGNOSIS — G4700 Insomnia, unspecified: Secondary | ICD-10-CM | POA: Diagnosis not present

## 2019-01-13 DIAGNOSIS — N184 Chronic kidney disease, stage 4 (severe): Secondary | ICD-10-CM | POA: Diagnosis not present

## 2019-01-13 DIAGNOSIS — F331 Major depressive disorder, recurrent, moderate: Secondary | ICD-10-CM | POA: Diagnosis not present

## 2019-01-13 DIAGNOSIS — F0150 Vascular dementia without behavioral disturbance: Secondary | ICD-10-CM | POA: Diagnosis not present

## 2019-01-13 DIAGNOSIS — K7460 Unspecified cirrhosis of liver: Secondary | ICD-10-CM | POA: Diagnosis not present

## 2019-01-13 DIAGNOSIS — E1122 Type 2 diabetes mellitus with diabetic chronic kidney disease: Secondary | ICD-10-CM | POA: Diagnosis not present

## 2019-01-16 DIAGNOSIS — L98499 Non-pressure chronic ulcer of skin of other sites with unspecified severity: Secondary | ICD-10-CM | POA: Diagnosis not present

## 2019-01-16 DIAGNOSIS — L89154 Pressure ulcer of sacral region, stage 4: Secondary | ICD-10-CM | POA: Diagnosis not present

## 2019-01-16 DIAGNOSIS — L97829 Non-pressure chronic ulcer of other part of left lower leg with unspecified severity: Secondary | ICD-10-CM | POA: Diagnosis not present

## 2019-01-20 DIAGNOSIS — R410 Disorientation, unspecified: Secondary | ICD-10-CM | POA: Diagnosis not present

## 2019-01-20 DIAGNOSIS — N390 Urinary tract infection, site not specified: Secondary | ICD-10-CM | POA: Diagnosis not present

## 2019-01-20 DIAGNOSIS — F418 Other specified anxiety disorders: Secondary | ICD-10-CM | POA: Diagnosis not present

## 2019-01-20 DIAGNOSIS — R531 Weakness: Secondary | ICD-10-CM | POA: Diagnosis not present

## 2019-01-20 DIAGNOSIS — R4182 Altered mental status, unspecified: Secondary | ICD-10-CM | POA: Diagnosis not present

## 2019-01-20 DIAGNOSIS — L89610 Pressure ulcer of right heel, unstageable: Secondary | ICD-10-CM | POA: Diagnosis not present

## 2019-01-20 DIAGNOSIS — E785 Hyperlipidemia, unspecified: Secondary | ICD-10-CM | POA: Diagnosis not present

## 2019-01-20 DIAGNOSIS — Z87898 Personal history of other specified conditions: Secondary | ICD-10-CM | POA: Diagnosis not present

## 2019-01-20 DIAGNOSIS — K5900 Constipation, unspecified: Secondary | ICD-10-CM | POA: Diagnosis not present

## 2019-01-22 ENCOUNTER — Telehealth: Payer: Self-pay

## 2019-01-22 NOTE — Telephone Encounter (Signed)
Pt taking atorvastatin  

## 2019-01-23 DIAGNOSIS — F331 Major depressive disorder, recurrent, moderate: Secondary | ICD-10-CM | POA: Diagnosis not present

## 2019-01-23 DIAGNOSIS — L97423 Non-pressure chronic ulcer of left heel and midfoot with necrosis of muscle: Secondary | ICD-10-CM | POA: Diagnosis not present

## 2019-01-23 DIAGNOSIS — L98499 Non-pressure chronic ulcer of skin of other sites with unspecified severity: Secondary | ICD-10-CM | POA: Diagnosis not present

## 2019-01-23 DIAGNOSIS — L89154 Pressure ulcer of sacral region, stage 4: Secondary | ICD-10-CM | POA: Diagnosis not present

## 2019-01-23 DIAGNOSIS — K7460 Unspecified cirrhosis of liver: Secondary | ICD-10-CM | POA: Diagnosis not present

## 2019-01-23 DIAGNOSIS — G4700 Insomnia, unspecified: Secondary | ICD-10-CM | POA: Diagnosis not present

## 2019-01-23 DIAGNOSIS — F0150 Vascular dementia without behavioral disturbance: Secondary | ICD-10-CM | POA: Diagnosis not present

## 2019-01-23 DIAGNOSIS — F1920 Other psychoactive substance dependence, uncomplicated: Secondary | ICD-10-CM | POA: Diagnosis not present

## 2019-01-23 DIAGNOSIS — E1122 Type 2 diabetes mellitus with diabetic chronic kidney disease: Secondary | ICD-10-CM | POA: Diagnosis not present

## 2019-01-23 DIAGNOSIS — F419 Anxiety disorder, unspecified: Secondary | ICD-10-CM | POA: Diagnosis not present

## 2019-01-23 DIAGNOSIS — N184 Chronic kidney disease, stage 4 (severe): Secondary | ICD-10-CM | POA: Diagnosis not present

## 2019-01-26 DIAGNOSIS — N184 Chronic kidney disease, stage 4 (severe): Secondary | ICD-10-CM | POA: Diagnosis not present

## 2019-01-26 DIAGNOSIS — F1920 Other psychoactive substance dependence, uncomplicated: Secondary | ICD-10-CM | POA: Diagnosis not present

## 2019-01-26 DIAGNOSIS — E1122 Type 2 diabetes mellitus with diabetic chronic kidney disease: Secondary | ICD-10-CM | POA: Diagnosis not present

## 2019-01-26 DIAGNOSIS — G4700 Insomnia, unspecified: Secondary | ICD-10-CM | POA: Diagnosis not present

## 2019-01-26 DIAGNOSIS — F331 Major depressive disorder, recurrent, moderate: Secondary | ICD-10-CM | POA: Diagnosis not present

## 2019-01-26 DIAGNOSIS — K7460 Unspecified cirrhosis of liver: Secondary | ICD-10-CM | POA: Diagnosis not present

## 2019-01-26 DIAGNOSIS — F0150 Vascular dementia without behavioral disturbance: Secondary | ICD-10-CM | POA: Diagnosis not present

## 2019-01-26 DIAGNOSIS — F419 Anxiety disorder, unspecified: Secondary | ICD-10-CM | POA: Diagnosis not present

## 2019-01-28 ENCOUNTER — Other Ambulatory Visit: Payer: Self-pay

## 2019-01-28 ENCOUNTER — Inpatient Hospital Stay
Admission: EM | Admit: 2019-01-28 | Discharge: 2019-02-12 | DRG: 853 | Disposition: A | Discharge status: Skilled Nursing Facility | Payer: Medicare HMO | Source: Skilled Nursing Facility | Attending: Internal Medicine | Admitting: Internal Medicine

## 2019-01-28 ENCOUNTER — Emergency Department: Payer: Medicare HMO

## 2019-01-28 DIAGNOSIS — D72829 Elevated white blood cell count, unspecified: Secondary | ICD-10-CM | POA: Diagnosis not present

## 2019-01-28 DIAGNOSIS — F419 Anxiety disorder, unspecified: Secondary | ICD-10-CM | POA: Diagnosis not present

## 2019-01-28 DIAGNOSIS — E43 Unspecified severe protein-calorie malnutrition: Secondary | ICD-10-CM | POA: Diagnosis present

## 2019-01-28 DIAGNOSIS — Z681 Body mass index (BMI) 19 or less, adult: Secondary | ICD-10-CM

## 2019-01-28 DIAGNOSIS — Z9889 Other specified postprocedural states: Secondary | ICD-10-CM | POA: Diagnosis not present

## 2019-01-28 DIAGNOSIS — Z66 Do not resuscitate: Secondary | ICD-10-CM | POA: Diagnosis present

## 2019-01-28 DIAGNOSIS — E875 Hyperkalemia: Secondary | ICD-10-CM | POA: Diagnosis present

## 2019-01-28 DIAGNOSIS — Z1621 Resistance to vancomycin: Secondary | ICD-10-CM | POA: Diagnosis not present

## 2019-01-28 DIAGNOSIS — Z7902 Long term (current) use of antithrombotics/antiplatelets: Secondary | ICD-10-CM

## 2019-01-28 DIAGNOSIS — R1312 Dysphagia, oropharyngeal phase: Secondary | ICD-10-CM | POA: Diagnosis not present

## 2019-01-28 DIAGNOSIS — R6520 Severe sepsis without septic shock: Secondary | ICD-10-CM | POA: Diagnosis present

## 2019-01-28 DIAGNOSIS — R488 Other symbolic dysfunctions: Secondary | ICD-10-CM | POA: Diagnosis not present

## 2019-01-28 DIAGNOSIS — Z7401 Bed confinement status: Secondary | ICD-10-CM | POA: Diagnosis not present

## 2019-01-28 DIAGNOSIS — F17210 Nicotine dependence, cigarettes, uncomplicated: Secondary | ICD-10-CM | POA: Diagnosis present

## 2019-01-28 DIAGNOSIS — Z20828 Contact with and (suspected) exposure to other viral communicable diseases: Secondary | ICD-10-CM | POA: Diagnosis not present

## 2019-01-28 DIAGNOSIS — I10 Essential (primary) hypertension: Secondary | ICD-10-CM | POA: Diagnosis not present

## 2019-01-28 DIAGNOSIS — N390 Urinary tract infection, site not specified: Secondary | ICD-10-CM | POA: Diagnosis not present

## 2019-01-28 DIAGNOSIS — D649 Anemia, unspecified: Secondary | ICD-10-CM | POA: Diagnosis not present

## 2019-01-28 DIAGNOSIS — R262 Difficulty in walking, not elsewhere classified: Secondary | ICD-10-CM | POA: Diagnosis not present

## 2019-01-28 DIAGNOSIS — Z89422 Acquired absence of other left toe(s): Secondary | ICD-10-CM | POA: Diagnosis not present

## 2019-01-28 DIAGNOSIS — Z743 Need for continuous supervision: Secondary | ICD-10-CM | POA: Diagnosis not present

## 2019-01-28 DIAGNOSIS — L89150 Pressure ulcer of sacral region, unstageable: Secondary | ICD-10-CM | POA: Diagnosis not present

## 2019-01-28 DIAGNOSIS — G894 Chronic pain syndrome: Secondary | ICD-10-CM | POA: Diagnosis not present

## 2019-01-28 DIAGNOSIS — M4628 Osteomyelitis of vertebra, sacral and sacrococcygeal region: Secondary | ICD-10-CM | POA: Diagnosis not present

## 2019-01-28 DIAGNOSIS — D638 Anemia in other chronic diseases classified elsewhere: Secondary | ICD-10-CM | POA: Diagnosis present

## 2019-01-28 DIAGNOSIS — N183 Chronic kidney disease, stage 3 (moderate): Secondary | ICD-10-CM | POA: Diagnosis present

## 2019-01-28 DIAGNOSIS — R531 Weakness: Secondary | ICD-10-CM

## 2019-01-28 DIAGNOSIS — E860 Dehydration: Secondary | ICD-10-CM | POA: Diagnosis present

## 2019-01-28 DIAGNOSIS — Z79899 Other long term (current) drug therapy: Secondary | ICD-10-CM | POA: Diagnosis not present

## 2019-01-28 DIAGNOSIS — D500 Iron deficiency anemia secondary to blood loss (chronic): Secondary | ICD-10-CM | POA: Diagnosis not present

## 2019-01-28 DIAGNOSIS — R7889 Finding of other specified substances, not normally found in blood: Secondary | ICD-10-CM | POA: Diagnosis not present

## 2019-01-28 DIAGNOSIS — Z90710 Acquired absence of both cervix and uterus: Secondary | ICD-10-CM | POA: Diagnosis not present

## 2019-01-28 DIAGNOSIS — R279 Unspecified lack of coordination: Secondary | ICD-10-CM | POA: Diagnosis not present

## 2019-01-28 DIAGNOSIS — Z23 Encounter for immunization: Secondary | ICD-10-CM | POA: Diagnosis not present

## 2019-01-28 DIAGNOSIS — F339 Major depressive disorder, recurrent, unspecified: Secondary | ICD-10-CM | POA: Diagnosis not present

## 2019-01-28 DIAGNOSIS — L89154 Pressure ulcer of sacral region, stage 4: Secondary | ICD-10-CM | POA: Diagnosis not present

## 2019-01-28 DIAGNOSIS — E11622 Type 2 diabetes mellitus with other skin ulcer: Secondary | ICD-10-CM | POA: Diagnosis not present

## 2019-01-28 DIAGNOSIS — I129 Hypertensive chronic kidney disease with stage 1 through stage 4 chronic kidney disease, or unspecified chronic kidney disease: Secondary | ICD-10-CM | POA: Diagnosis not present

## 2019-01-28 DIAGNOSIS — E1122 Type 2 diabetes mellitus with diabetic chronic kidney disease: Secondary | ICD-10-CM | POA: Diagnosis present

## 2019-01-28 DIAGNOSIS — L89120 Pressure ulcer of left upper back, unstageable: Secondary | ICD-10-CM | POA: Diagnosis not present

## 2019-01-28 DIAGNOSIS — F410 Panic disorder [episodic paroxysmal anxiety] without agoraphobia: Secondary | ICD-10-CM | POA: Diagnosis present

## 2019-01-28 DIAGNOSIS — L8990 Pressure ulcer of unspecified site, unspecified stage: Secondary | ICD-10-CM | POA: Diagnosis not present

## 2019-01-28 DIAGNOSIS — N3001 Acute cystitis with hematuria: Secondary | ICD-10-CM | POA: Diagnosis not present

## 2019-01-28 DIAGNOSIS — I739 Peripheral vascular disease, unspecified: Secondary | ICD-10-CM | POA: Diagnosis not present

## 2019-01-28 DIAGNOSIS — Z833 Family history of diabetes mellitus: Secondary | ICD-10-CM

## 2019-01-28 DIAGNOSIS — E1152 Type 2 diabetes mellitus with diabetic peripheral angiopathy with gangrene: Secondary | ICD-10-CM | POA: Diagnosis not present

## 2019-01-28 DIAGNOSIS — I959 Hypotension, unspecified: Secondary | ICD-10-CM | POA: Diagnosis not present

## 2019-01-28 DIAGNOSIS — E569 Vitamin deficiency, unspecified: Secondary | ICD-10-CM | POA: Diagnosis not present

## 2019-01-28 DIAGNOSIS — Z9049 Acquired absence of other specified parts of digestive tract: Secondary | ICD-10-CM

## 2019-01-28 DIAGNOSIS — N179 Acute kidney failure, unspecified: Secondary | ICD-10-CM | POA: Diagnosis not present

## 2019-01-28 DIAGNOSIS — E46 Unspecified protein-calorie malnutrition: Secondary | ICD-10-CM | POA: Diagnosis not present

## 2019-01-28 DIAGNOSIS — E785 Hyperlipidemia, unspecified: Secondary | ICD-10-CM | POA: Diagnosis not present

## 2019-01-28 DIAGNOSIS — R319 Hematuria, unspecified: Secondary | ICD-10-CM | POA: Diagnosis not present

## 2019-01-28 DIAGNOSIS — A419 Sepsis, unspecified organism: Secondary | ICD-10-CM | POA: Diagnosis not present

## 2019-01-28 DIAGNOSIS — F329 Major depressive disorder, single episode, unspecified: Secondary | ICD-10-CM | POA: Diagnosis present

## 2019-01-28 DIAGNOSIS — B952 Enterococcus as the cause of diseases classified elsewhere: Secondary | ICD-10-CM | POA: Diagnosis not present

## 2019-01-28 DIAGNOSIS — L089 Local infection of the skin and subcutaneous tissue, unspecified: Secondary | ICD-10-CM | POA: Diagnosis present

## 2019-01-28 DIAGNOSIS — R498 Other voice and resonance disorders: Secondary | ICD-10-CM | POA: Diagnosis not present

## 2019-01-28 DIAGNOSIS — M6281 Muscle weakness (generalized): Secondary | ICD-10-CM | POA: Diagnosis not present

## 2019-01-28 DIAGNOSIS — Z978 Presence of other specified devices: Secondary | ICD-10-CM | POA: Diagnosis not present

## 2019-01-28 DIAGNOSIS — L89159 Pressure ulcer of sacral region, unspecified stage: Secondary | ICD-10-CM | POA: Diagnosis not present

## 2019-01-28 DIAGNOSIS — E1169 Type 2 diabetes mellitus with other specified complication: Secondary | ICD-10-CM | POA: Diagnosis present

## 2019-01-28 DIAGNOSIS — N184 Chronic kidney disease, stage 4 (severe): Secondary | ICD-10-CM | POA: Diagnosis not present

## 2019-01-28 DIAGNOSIS — F418 Other specified anxiety disorders: Secondary | ICD-10-CM | POA: Diagnosis not present

## 2019-01-28 DIAGNOSIS — L8995 Pressure ulcer of unspecified site, unstageable: Secondary | ICD-10-CM | POA: Diagnosis not present

## 2019-01-28 DIAGNOSIS — R2681 Unsteadiness on feet: Secondary | ICD-10-CM | POA: Diagnosis not present

## 2019-01-28 DIAGNOSIS — R52 Pain, unspecified: Secondary | ICD-10-CM | POA: Diagnosis not present

## 2019-01-28 DIAGNOSIS — B1920 Unspecified viral hepatitis C without hepatic coma: Secondary | ICD-10-CM | POA: Diagnosis not present

## 2019-01-28 LAB — CBC WITH DIFFERENTIAL/PLATELET
Abs Immature Granulocytes: 0.1 10*3/uL — ABNORMAL HIGH (ref 0.00–0.07)
Basophils Absolute: 0 10*3/uL (ref 0.0–0.1)
Basophils Relative: 0 %
Eosinophils Absolute: 0 10*3/uL (ref 0.0–0.5)
Eosinophils Relative: 0 %
HCT: 23.3 % — ABNORMAL LOW (ref 36.0–46.0)
Hemoglobin: 7 g/dL — ABNORMAL LOW (ref 12.0–15.0)
Immature Granulocytes: 1 %
Lymphocytes Relative: 5 %
Lymphs Abs: 0.9 10*3/uL (ref 0.7–4.0)
MCH: 28.7 pg (ref 26.0–34.0)
MCHC: 30 g/dL (ref 30.0–36.0)
MCV: 95.5 fL (ref 80.0–100.0)
Monocytes Absolute: 0.7 10*3/uL (ref 0.1–1.0)
Monocytes Relative: 4 %
Neutro Abs: 15.8 10*3/uL — ABNORMAL HIGH (ref 1.7–7.7)
Neutrophils Relative %: 90 %
Platelets: 680 10*3/uL — ABNORMAL HIGH (ref 150–400)
RBC: 2.44 MIL/uL — ABNORMAL LOW (ref 3.87–5.11)
RDW: 14.5 % (ref 11.5–15.5)
WBC: 17.6 10*3/uL — ABNORMAL HIGH (ref 4.0–10.5)
nRBC: 0 % (ref 0.0–0.2)

## 2019-01-28 LAB — COMPREHENSIVE METABOLIC PANEL
ALT: 15 U/L (ref 0–44)
AST: 16 U/L (ref 15–41)
Albumin: 2.3 g/dL — ABNORMAL LOW (ref 3.5–5.0)
Alkaline Phosphatase: 109 U/L (ref 38–126)
Anion gap: 8 (ref 5–15)
BUN: 29 mg/dL — ABNORMAL HIGH (ref 8–23)
CO2: 23 mmol/L (ref 22–32)
Calcium: 8.6 mg/dL — ABNORMAL LOW (ref 8.9–10.3)
Chloride: 109 mmol/L (ref 98–111)
Creatinine, Ser: 1.64 mg/dL — ABNORMAL HIGH (ref 0.44–1.00)
GFR calc Af Amer: 37 mL/min — ABNORMAL LOW (ref >60)
GFR calc non Af Amer: 32 mL/min — ABNORMAL LOW (ref >60)
Glucose, Bld: 175 mg/dL — ABNORMAL HIGH (ref 70–99)
Potassium: 5 mmol/L (ref 3.5–5.1)
Sodium: 140 mmol/L (ref 135–145)
Total Bilirubin: 0.6 mg/dL (ref 0.3–1.2)
Total Protein: 6.6 g/dL (ref 6.5–8.1)

## 2019-01-28 LAB — URINALYSIS, COMPLETE (UACMP) WITH MICROSCOPIC
Bilirubin Urine: NEGATIVE
Glucose, UA: NEGATIVE mg/dL
Ketones, ur: NEGATIVE mg/dL
Nitrite: NEGATIVE
Protein, ur: 100 mg/dL — AB
Specific Gravity, Urine: 1.01 (ref 1.005–1.030)
Squamous Epithelial / LPF: NONE SEEN (ref 0–5)
WBC, UA: >50 WBC/hpf — ABNORMAL HIGH (ref 0–5)
pH: 7 (ref 5.0–8.0)

## 2019-01-28 LAB — LACTIC ACID, PLASMA
Lactic Acid, Venous: 0.9 mmol/L (ref 0.5–1.9)
Lactic Acid, Venous: 0.8 mmol/L (ref 0.5–1.9)

## 2019-01-28 LAB — PROTIME-INR
INR: 1.3 — ABNORMAL HIGH (ref 0.8–1.2)
Prothrombin Time: 15.7 seconds — ABNORMAL HIGH (ref 11.4–15.2)

## 2019-01-28 LAB — SARS CORONAVIRUS 2 BY RT PCR (HOSPITAL ORDER, PERFORMED IN ~~LOC~~ HOSPITAL LAB): SARS Coronavirus 2: NEGATIVE

## 2019-01-28 MED ORDER — SODIUM CHLORIDE 0.9 % IV SOLN
2.0000 g | Freq: Once | INTRAVENOUS | Status: AC
  Administered 2019-01-28: 13:00:00 2 g via INTRAVENOUS
  Filled 2019-01-28: qty 2

## 2019-01-28 MED ORDER — FAMOTIDINE IN NACL 20-0.9 MG/50ML-% IV SOLN
20.0000 mg | INTRAVENOUS | Status: DC
  Administered 2019-01-28: 20 mg via INTRAVENOUS
  Filled 2019-01-28: qty 50

## 2019-01-28 MED ORDER — SODIUM CHLORIDE 0.9 % IV SOLN
INTRAVENOUS | Status: DC
  Administered 2019-01-28 – 2019-01-29 (×3): via INTRAVENOUS

## 2019-01-28 MED ORDER — DOCUSATE SODIUM 100 MG PO CAPS
100.0000 mg | ORAL_CAPSULE | Freq: Two times a day (BID) | ORAL | Status: DC
  Administered 2019-01-28 – 2019-02-11 (×26): 100 mg via ORAL
  Filled 2019-01-28 (×26): qty 1

## 2019-01-28 MED ORDER — PNEUMOCOCCAL VAC POLYVALENT 25 MCG/0.5ML IJ INJ
0.5000 mL | INJECTION | INTRAMUSCULAR | Status: AC
  Administered 2019-01-29: 0.5 mL via INTRAMUSCULAR
  Filled 2019-01-28: qty 0.5

## 2019-01-28 MED ORDER — GLUCERNA SHAKE PO LIQD: 237.0000 mL | Freq: Two times a day (BID) | ORAL | Status: DC

## 2019-01-28 MED ORDER — CLONAZEPAM 0.125 MG PO TBDP
0.1250 mg | ORAL_TABLET | Freq: Three times a day (TID) | ORAL | Status: DC | PRN
  Administered 2019-01-31 – 2019-02-04 (×6): 0.125 mg via ORAL
  Filled 2019-01-28 (×6): qty 1

## 2019-01-28 MED ORDER — VITAMIN C 500 MG PO TABS
250.0000 mg | ORAL_TABLET | Freq: Two times a day (BID) | ORAL | Status: DC
  Administered 2019-01-29 – 2019-02-11 (×27): 250 mg via ORAL
  Filled 2019-01-28 (×28): qty 1

## 2019-01-28 MED ORDER — ESCITALOPRAM OXALATE 10 MG PO TABS
5.0000 mg | ORAL_TABLET | Freq: Every day | ORAL | Status: DC
  Administered 2019-01-29 – 2019-02-06 (×8): 5 mg via ORAL
  Filled 2019-01-28 (×10): qty 0.5

## 2019-01-28 MED ORDER — ONDANSETRON HCL 4 MG PO TABS: 4.0000 mg | ORAL_TABLET | Freq: Four times a day (QID) | ORAL | Status: DC | PRN

## 2019-01-28 MED ORDER — HYDROCODONE-ACETAMINOPHEN 5-325 MG PO TABS
1.0000 | ORAL_TABLET | ORAL | Status: DC | PRN
  Administered 2019-01-28 – 2019-02-11 (×40): 1 via ORAL
  Filled 2019-01-28 (×40): qty 1

## 2019-01-28 MED ORDER — ACETAMINOPHEN 325 MG PO TABS: 650.0000 mg | ORAL_TABLET | Freq: Four times a day (QID) | ORAL | Status: DC | PRN

## 2019-01-28 MED ORDER — BISACODYL 5 MG PO TBEC
5.0000 mg | DELAYED_RELEASE_TABLET | Freq: Every day | ORAL | Status: DC | PRN
  Administered 2019-01-30: 5 mg via ORAL
  Filled 2019-01-28: qty 1

## 2019-01-28 MED ORDER — DOCUSATE SODIUM 100 MG PO CAPS
200.0000 mg | ORAL_CAPSULE | Freq: Every day | ORAL | Status: DC | PRN
  Filled 2019-01-28: qty 2

## 2019-01-28 MED ORDER — VANCOMYCIN HCL IN DEXTROSE 750-5 MG/150ML-% IV SOLN
750.0000 mg | INTRAVENOUS | Status: DC
  Administered 2019-01-30: 750 mg via INTRAVENOUS
  Filled 2019-01-28: qty 150

## 2019-01-28 MED ORDER — NEPRO/CARBSTEADY PO LIQD
237.0000 mL | Freq: Two times a day (BID) | ORAL | Status: DC
  Administered 2019-01-29 – 2019-02-02 (×8): 237 mL via ORAL

## 2019-01-28 MED ORDER — ORAL CARE MOUTH RINSE
15.0000 mL | Freq: Two times a day (BID) | OROMUCOSAL | Status: DC
  Administered 2019-01-28 – 2019-02-11 (×19): 15 mL via OROMUCOSAL

## 2019-01-28 MED ORDER — POLYETHYLENE GLYCOL 3350 17 G PO PACK: 17.0000 g | PACK | Freq: Every day | ORAL | Status: DC | PRN

## 2019-01-28 MED ORDER — TAB-A-VITE/IRON PO TABS
1.0000 | ORAL_TABLET | Freq: Every day | ORAL | Status: DC
  Administered 2019-01-29 – 2019-02-11 (×12): 1 via ORAL
  Filled 2019-01-28 (×18): qty 1

## 2019-01-28 MED ORDER — MELATONIN 5 MG PO TABS
5.0000 mg | ORAL_TABLET | Freq: Every day | ORAL | Status: DC
  Administered 2019-01-28 – 2019-02-11 (×15): 5 mg via ORAL
  Filled 2019-01-28 (×17): qty 1

## 2019-01-28 MED ORDER — VITAMIN D 25 MCG (1000 UNIT) PO TABS
1000.0000 [IU] | ORAL_TABLET | Freq: Every day | ORAL | Status: DC
  Administered 2019-01-28 – 2019-02-11 (×14): 1000 [IU] via ORAL
  Filled 2019-01-28 (×14): qty 1

## 2019-01-28 MED ORDER — SODIUM CHLORIDE 0.9 % IV SOLN
2.0000 g | INTRAVENOUS | Status: DC
  Administered 2019-01-29: 12:00:00 2 g via INTRAVENOUS
  Filled 2019-01-28 (×2): qty 2

## 2019-01-28 MED ORDER — HEPARIN SODIUM (PORCINE) 5000 UNIT/ML IJ SOLN
5000.0000 [IU] | Freq: Three times a day (TID) | INTRAMUSCULAR | Status: DC
  Administered 2019-01-28 – 2019-01-29 (×3): 5000 [IU] via SUBCUTANEOUS
  Filled 2019-01-28 (×3): qty 1

## 2019-01-28 MED ORDER — LORAZEPAM 0.5 MG PO TABS: 0.5000 mg | ORAL_TABLET | Freq: Four times a day (QID) | ORAL | Status: DC | PRN

## 2019-01-28 MED ORDER — VANCOMYCIN HCL 1.25 G IV SOLR
1250.0000 mg | Freq: Once | INTRAVENOUS | Status: AC
  Administered 2019-01-28: 1250 mg via INTRAVENOUS
  Filled 2019-01-28: qty 1250

## 2019-01-28 MED ORDER — ACETAMINOPHEN 650 MG RE SUPP: 650.0000 mg | Freq: Four times a day (QID) | RECTAL | Status: DC | PRN

## 2019-01-28 MED ORDER — ONDANSETRON HCL 4 MG/2ML IJ SOLN
4.0000 mg | Freq: Four times a day (QID) | INTRAMUSCULAR | Status: DC | PRN
  Administered 2019-02-05: 4 mg via INTRAVENOUS

## 2019-01-28 MED ORDER — CLONAZEPAM 0.125 MG PO TBDP: 0.1250 mg | ORAL_TABLET | Freq: Every day | ORAL | Status: DC

## 2019-01-28 MED ORDER — TRAZODONE HCL 50 MG PO TABS
25.0000 mg | ORAL_TABLET | Freq: Every evening | ORAL | Status: DC | PRN
  Administered 2019-02-01 – 2019-02-04 (×3): 25 mg via ORAL
  Filled 2019-01-28 (×3): qty 1

## 2019-01-28 NOTE — ED Notes (Signed)
Pt has sacral wound with foul smelling yellow brown drainage. PT also has 2 sores on the bottom of her left foot

## 2019-01-28 NOTE — ED Triage Notes (Signed)
Arrives to ER via [facility] from [facility] for hypotension ongiong for last few days. BP 112/70's with EMS, HR WNL, other VSS WNL. Pt has sacral wound with yellow/brown drainge. 6.9 hemoglobin on recent labs, possibly drawn yesterday, bacteria is urine sample.

## 2019-01-28 NOTE — ED Notes (Signed)
ED TO INPATIENT HANDOFF REPORT  ED Nurse Name and Phone #: Janett Billow [phone]   S Name/Age/Gender Yesenia Shepard 68 y.o. female Room/Bed: ED26A/ED26A  Code Status   Code Status: Full Code  Home/SNF/Other Nursing Home Patient oriented to: self, place, time and situation Is this baseline? Yes   Triage Complete: Triage complete  Chief Complaint weakness  Triage Note Arrives to ER via [facility] from [facility] for hypotension ongiong for last few days. BP 112/70's with EMS, HR WNL, other VSS WNL. Pt has sacral wound with yellow/brown drainge. 6.9 hemoglobin on recent labs, possibly drawn yesterday, bacteria is urine sample.   Allergies No Known Allergies  Level of Care/Admitting Diagnosis ED Disposition    ED Disposition Condition [facility]: [place] [100120]  Level of Care: Med-Surg [16]  Covid Evaluation: Confirmed COVID Negative  Diagnosis: Decubitus ulcer, infected [299371]  Admitting Physician: Epifanio Lesches [[id number]]  Attending Physician: Epifanio Lesches [phone]  Estimated length of stay: past midnight tomorrow  Certification:: I certify this patient will need inpatient services for at least 2 midnights  PT Class (Do Not Modify): Inpatient [101]  PT Acc Code (Do Not Modify): Private [1]       B Medical/Surgery History Past Medical History:  Diagnosis Date  . Anxiety   . Arthritis   . Chronic pain   . Depression   . Diabetes mellitus without complication ([place])   . Drug abuse ([place])    History of polysubstance abuse; currently on methadone.  . Hepatitis    History of Hep "C". treated and cured with Harvoni  . History of chicken pox   . Hypertension   . Panic attacks   . Peripheral vascular disease ([place])    stent in place.   Marland Kitchen UTI (urinary tract infection)    History of   Past Surgical History:  Procedure Laterality Date  . ABDOMINAL HYSTERECTOMY  1989  . AMPUTATION TOE Left 10/26/2018   Procedure:  5TH RAY RESCTION;  Surgeon: Albertine Patricia, DPM;  Location: [facility];  Service: Podiatry;  Laterality: Left;  . CHOLECYSTECTOMY  1987  . INTRACAPSULAR CATARACT EXTRACTION Left   . LOWER EXTREMITY ANGIOGRAPHY Left 12/17/2017   Procedure: LOWER EXTREMITY ANGIOGRAPHY;  Surgeon: Katha Cabal, MD;  Location: [facility];  Service: Cardiovascular;  Laterality: Left;  . LOWER EXTREMITY ANGIOGRAPHY Left 10/24/2018   Procedure: LOWER EXTREMITY ANGIOGRAPHY;  Surgeon: Algernon Huxley, MD;  Location: [facility];  Service: Cardiovascular;  Laterality: Left;  . PERIPHERAL VASCULAR CATHETERIZATION Left 05/04/2015   Procedure: Lower Extremity Angiography;  Surgeon: Katha Cabal, MD;  Location: [facility];  Service: Cardiovascular;  Laterality: Left;  . PERIPHERAL VASCULAR CATHETERIZATION Left 05/04/2015   Procedure: Lower Extremity Intervention;  Surgeon: Katha Cabal, MD;  Location: [facility];  Service: Cardiovascular;  Laterality: Left;  . SHOULDER ARTHROSCOPY WITH OPEN ROTATOR CUFF REPAIR Right 01/16/2017   Procedure: SHOULDER ARTHROSCOPY WITH OPEN ROTATOR CUFF REPAIR;  Surgeon: Thornton Park, MD;  Location: [facility];  Service: Orthopedics;  Laterality: Right;  . TONSILLECTOMY AND ADENOIDECTOMY  1971     A IV Location/Drains/Wounds Patient Lines/Drains/Airways Status   Active Line/Drains/Airways    Name:   Placement date:   Placement time:   Site:   Days:   Peripheral IV 12/26/18 Anterior;Right Forearm   12/26/18    1316    Forearm   33   Peripheral IV 01/28/19 Right Antecubital  01/28/19    1236    Antecubital   less than 1   Peripheral IV 01/28/19 Left Antecubital   01/28/19    1140    Antecubital   less than 1   Incision (Closed) 10/26/18 Foot Left   10/26/18    0847     94   Pressure Injury Buttocks Left   -    -        Pressure Injury Buttocks Right   -    -        Wound / Incision (Open or Dehisced) 12/23/18 Non-pressure wound Foot  Anterior;Right ruputured bulla with roof intact   12/23/18    1007    Foot   36          Intake/Output Last 24 hours No intake or output data in the 24 hours ending 01/28/19 1418  Labs/Imaging Results for orders placed or performed during the hospital encounter of 01/28/19 (from the past 48 hour(s))  CBC with Differential     Status: Abnormal   Collection Time: 01/28/19 11:13 AM  Result Value Ref Range   WBC 17.6 (H) 4.0 - 10.5 K/uL   RBC 2.44 (L) 3.87 - 5.11 MIL/uL   Hemoglobin 7.0 (L) 12.0 - 15.0 g/dL   HCT 23.3 (L) 36.0 - 46.0 %   MCV 95.5 80.0 - 100.0 fL   MCH 28.7 26.0 - 34.0 pg   MCHC 30.0 30.0 - 36.0 g/dL   RDW 14.5 11.5 - 15.5 %   Platelets 680 (H) 150 - 400 K/uL   nRBC 0.0 0.0 - 0.2 %   Neutrophils Relative % 90 %   Neutro Abs 15.8 (H) 1.7 - 7.7 K/uL   Lymphocytes Relative 5 %   Lymphs Abs 0.9 0.7 - 4.0 K/uL   Monocytes Relative 4 %   Monocytes Absolute 0.7 0.1 - 1.0 K/uL   Eosinophils Relative 0 %   Eosinophils Absolute 0.0 0.0 - 0.5 K/uL   Basophils Relative 0 %   Basophils Absolute 0.0 0.0 - 0.1 K/uL   Immature Granulocytes 1 %   Abs Immature Granulocytes 0.10 (H) 0.00 - 0.07 K/uL    Comment: Performed at [facility], [address] [place], [place] [address]  Comprehensive metabolic panel     Status: Abnormal   Collection Time: 01/28/19 11:13 AM  Result Value Ref Range   Sodium 140 135 - 145 mmol/L   Potassium 5.0 3.5 - 5.1 mmol/L   Chloride 109 98 - 111 mmol/L   CO2 23 22 - 32 mmol/L   Glucose, Bld 175 (H) 70 - 99 mg/dL   BUN 29 (H) 8 - 23 mg/dL   Creatinine, Ser 1.64 (H) 0.44 - 1.00 mg/dL   Calcium 8.6 (L) 8.9 - 10.3 mg/dL   Total Protein 6.6 6.5 - 8.1 g/dL   Albumin 2.3 (L) 3.5 - 5.0 g/dL   AST 16 15 - 41 U/L   ALT 15 0 - 44 U/L   Alkaline Phosphatase 109 38 - 126 U/L   Total Bilirubin 0.6 0.3 - 1.2 mg/dL   GFR calc non Af Amer 32 (L) >60 mL/min   GFR calc Af Amer 37 (L) >60 mL/min   Anion gap 8 5 - 15    Comment: Performed at  [facility], [address], [place], [place] [address]  Protime-INR     Status: Abnormal   Collection Time: 01/28/19 11:13 AM  Result Value Ref Range   Prothrombin Time  15.7 (H) 11.4 - 15.2 seconds   INR 1.3 (H) 0.8 - 1.2    Comment: (NOTE) INR goal varies based on device and disease states. Performed at [facility], [address] [place], [place] [address]   Urinalysis, Complete w Microscopic     Status: Abnormal   Collection Time: 01/28/19 11:14 AM  Result Value Ref Range   Color, Urine AMBER (A) YELLOW    Comment: BIOCHEMICALS MAY BE AFFECTED BY COLOR   APPearance TURBID (A) CLEAR   Specific Gravity, Urine 1.010 1.005 - 1.030   pH 7.0 5.0 - 8.0   Glucose, UA NEGATIVE NEGATIVE mg/dL   Hgb urine dipstick SMALL (A) NEGATIVE   Bilirubin Urine NEGATIVE NEGATIVE   Ketones, ur NEGATIVE NEGATIVE mg/dL   Protein, ur 100 (A) NEGATIVE mg/dL   Nitrite NEGATIVE NEGATIVE   Leukocytes,Ua LARGE (A) NEGATIVE   RBC / HPF 0-5 0 - 5 RBC/hpf   WBC, UA >50 (H) 0 - 5 WBC/hpf   Bacteria, UA MANY (A) NONE SEEN   Squamous Epithelial / LPF NONE SEEN 0 - 5   WBC Clumps PRESENT    Mucus PRESENT     Comment: Performed at [facility], [address] [place], [place] [address]  Type and screen [place]     Status: None   Collection Time: 01/28/19 11:14 AM  Result Value Ref Range   ABO/RH(D) A POS    Antibody Screen NEG    Sample Expiration      01/31/2019,2359 Performed at [facility], [address] [place], [place] [address]   Lactic acid, plasma     Status: None   Collection Time: 01/28/19 11:14 AM  Result Value Ref Range   Lactic Acid, Venous 0.9 0.5 - 1.9 mmol/L    Comment: Performed at [facility], [address], [place], [place] [address]  SARS Coronavirus 2 (CEPHEID - Performed in [place] hospital lab), Hosp Order     Status: None   Collection Time: 01/28/19 11:14 AM   Specimen: Nasopharyngeal Swab   Result Value Ref Range   SARS Coronavirus 2 NEGATIVE NEGATIVE    Comment: (NOTE) If result is NEGATIVE SARS-CoV-2 target nucleic acids are NOT DETECTED. The SARS-CoV-2 RNA is generally detectable in upper and lower  respiratory specimens during the acute phase of infection. The lowest  concentration of SARS-CoV-2 viral copies this assay can detect is 250  copies / mL. A negative result does not preclude SARS-CoV-2 infection  and should not be used as the sole basis for treatment or other  patient management decisions.  A negative result may occur with  improper specimen collection / handling, submission of specimen other  than nasopharyngeal swab, presence of viral mutation(s) within the  areas targeted by this assay, and inadequate number of viral copies  (<250 copies / mL). A negative result must be combined with clinical  observations, patient history, and epidemiological information. If result is POSITIVE SARS-CoV-2 target nucleic acids are DETECTED. The SARS-CoV-2 RNA is generally detectable in upper and lower  respiratory specimens dur ing the acute phase of infection.  Positive  results are indicative of active infection with SARS-CoV-2.  Clinical  correlation with patient history and other diagnostic information is  necessary to determine patient infection status.  Positive results do  not rule out bacterial infection or co-infection with other viruses. If result is PRESUMPTIVE POSTIVE SARS-CoV-2 nucleic acids MAY BE PRESENT.   A presumptive positive result  was obtained on the submitted specimen  and confirmed on repeat testing.  While 2019 novel coronavirus  (SARS-CoV-2) nucleic acids may be present in the submitted sample  additional confirmatory testing may be necessary for epidemiological  and / or clinical management purposes  to differentiate between  SARS-CoV-2 and other Sarbecovirus currently known to infect humans.  If clinically indicated additional testing with  an alternate test  methodology [phone]) is advised. The SARS-CoV-2 RNA is generally  detectable in upper and lower respiratory sp ecimens during the acute  phase of infection. The expected result is Negative. Fact Sheet for Patients:  StrictlyIdeas.no Fact Sheet for Healthcare Providers: BankingDealers.co.za This test is not yet approved or cleared by the [place] FDA and has been authorized for detection and/or diagnosis of SARS-CoV-2 by FDA under an Emergency Use Authorization (EUA).  This EUA will remain in effect (meaning this test can be used) for the duration of the COVID-19 declaration under Section 564(b)(1) of the Act, 21 U.S.C. section 360bbb-3(b)(1), unless the authorization is terminated or revoked sooner. Performed at [facility], [address] [place], [place] [address]    Dg Chest Portable 1 View  Result Date: 01/28/2019 CLINICAL DATA:  Hypotension for the past few days. EXAM: PORTABLE CHEST 1 VIEW COMPARISON:  None. FINDINGS: Lungs clear. Skin folds over both the right and left chest noted. No pneumothorax or pleural fluid. Heart size is upper normal. Aortic atherosclerosis. No acute or focal bony abnormality. IMPRESSION: No acute disease. Atherosclerosis. Electronically Signed   By: Inge Rise M.D.   On: 01/28/2019 11:48    Pending Labs Unresulted Labs (From admission, onward)    Start     Ordered   01/29/19 2706  Basic metabolic panel  Tomorrow morning,   STAT     01/28/19 1306   01/29/19 0500  CBC  Tomorrow morning,   STAT     01/28/19 1306   01/28/19 1308  Lactic acid, plasma  Now then every 2 hours,   STAT     01/28/19 1306   01/28/19 1216  Urine culture  Add-on,   AD     01/28/19 1216   01/28/19 1215  Blood culture (routine x 2)  BLOOD CULTURE X 2,   STAT     01/28/19 1215          Vitals/Pain Today's Vitals   01/28/19 1200 01/28/19 1345 01/28/19 1400 01/28/19 1415  BP: (!)  104/57  (!) 109/54   Pulse: 80 79 75   Resp: 15 16 (!) 24 15  Temp:      TempSrc:      SpO2: 99% 100% 98%   Weight:      Height:      PainSc:        Isolation Precautions No active isolations  Medications Medications  vancomycin (VANCOCIN) 1,250 mg in sodium chloride 0.9 % 250 mL IVPB (1,250 mg Intravenous New Bag/Given 01/28/19 1341)  escitalopram (LEXAPRO) tablet 5 mg (has no administration in time range)  docusate sodium (COLACE) capsule 200 mg (has no administration in time range)  polyethylene glycol (MIRALAX / GLYCOLAX) packet 17 g (has no administration in time range)  Melatonin TABS 5 mg (has no administration in time range)  feeding supplement (GLUCERNA SHAKE) (GLUCERNA SHAKE) liquid 237 mL (has no administration in time range)  clonazepam (KLONOPIN) disintegrating tablet 0.125 mg (has no administration in time range)  multivitamins with iron tablet 1 tablet (has no administration in time range)  vitamin  C (ASCORBIC ACID) tablet 250 mg (has no administration in time range)  cholecalciferol (VITAMIN D3) tablet 1,000 Units (has no administration in time range)  heparin injection 5,000 Units (has no administration in time range)  0.9 %  sodium chloride infusion (has no administration in time range)  acetaminophen (TYLENOL) tablet 650 mg (has no administration in time range)    Or  acetaminophen (TYLENOL) suppository 650 mg (has no administration in time range)  HYDROcodone-acetaminophen (NORCO/VICODIN) 5-325 MG per tablet 1 tablet (has no administration in time range)  traZODone (DESYREL) tablet 25 mg (has no administration in time range)  docusate sodium (COLACE) capsule 100 mg (has no administration in time range)  bisacodyl (DULCOLAX) EC tablet 5 mg (has no administration in time range)  ondansetron (ZOFRAN) tablet 4 mg (has no administration in time range)    Or  ondansetron (ZOFRAN) injection 4 mg (has no administration in time range)  famotidine (PEPCID) IVPB 20 mg  premix (has no administration in time range)  ceFEPIme (MAXIPIME) 2 g in sodium chloride 0.9 % 100 mL IVPB (has no administration in time range)  vancomycin (VANCOCIN) IVPB 750 mg/150 ml premix (has no administration in time range)  ceFEPIme (MAXIPIME) 2 g in sodium chloride 0.9 % 100 mL IVPB (0 g Intravenous Stopped 01/28/19 1340)    Mobility non-ambulatory Moderate fall risk   Focused Assessments 1   R Recommendations: See Admitting Provider Note  Report given to:   Additional Notes:

## 2019-01-28 NOTE — Progress Notes (Signed)
Patient from ed. Patient is drowsy. Patient does not wish to eat or take medicines at this time. Once patient is more alert, nutritional supplement will be offered again.

## 2019-01-28 NOTE — H&P (Signed)
[facility] NAME: Yesenia Shepard    MR#:  [id number]  DATE OF BIRTH:  05/21/1951  DATE OF ADMISSION:  01/28/2019  PRIMARY CARE PHYSICIAN: [name], [name], MD   REQUESTING/REFERRING PHYSICIAN: Dr. Lindell Noe  CHIEF COMPLAINT: Hypotension   Chief Complaint  Patient presents with  . Hypotension    HISTORY OF PRESENT ILLNESS:  Yesenia Shepard  is a 68 y.o. female with a known history of hepatitis C, chronic pain syndrome, depression comes from [facility] secondary to hypotension, greenish-brown discharge from sacral decubiti.  Patient recently was given Levaquin for UTI.  Patient history obtained from ER charts as she is lethargic.  Patient is hypotensive, found to have UTI, infected sacral decubiti, white count is elevated to 17.6, acute kidney injury with creatinine 1.64, potassium 5, hemoglobin of 7.  Has anxiety, chronic pain medication needs. PAST MEDICAL HISTORY:   Past Medical History:  Diagnosis Date  . Anxiety   . Arthritis   . Chronic pain   . Depression   . Diabetes mellitus without complication ([place])   . Drug abuse ([place])    History of polysubstance abuse; currently on methadone.  . Hepatitis    History of Hep "C". treated and cured with Harvoni  . History of chicken pox   . Hypertension   . Panic attacks   . Peripheral vascular disease ([place])    stent in place.   Marland Kitchen UTI (urinary tract infection)    History of    PAST SURGICAL HISTOIRY:   Past Surgical History:  Procedure Laterality Date  . ABDOMINAL HYSTERECTOMY  1989  . AMPUTATION TOE Left 10/26/2018   Procedure: 5TH RAY RESCTION;  Surgeon: Albertine Patricia, DPM;  Location: [facility];  Service: Podiatry;  Laterality: Left;  . CHOLECYSTECTOMY  1987  . INTRACAPSULAR CATARACT EXTRACTION Left   . LOWER EXTREMITY ANGIOGRAPHY Left 12/17/2017   Procedure: LOWER EXTREMITY ANGIOGRAPHY;  Surgeon: Katha Cabal, MD;  Location: [place] [facility];  Service: Cardiovascular;  Laterality: Left;  . LOWER EXTREMITY ANGIOGRAPHY Left 10/24/2018   Procedure: LOWER EXTREMITY ANGIOGRAPHY;  Surgeon: Algernon Huxley, MD;  Location: [place] [facility];  Service: Cardiovascular;  Laterality: Left;  . PERIPHERAL VASCULAR CATHETERIZATION Left 05/04/2015   Procedure: Lower Extremity Angiography;  Surgeon: Katha Cabal, MD;  Location: [place] [facility];  Service: Cardiovascular;  Laterality: Left;  . PERIPHERAL VASCULAR CATHETERIZATION Left 05/04/2015   Procedure: Lower Extremity Intervention;  Surgeon: Katha Cabal, MD;  Location: [place] [facility];  Service: Cardiovascular;  Laterality: Left;  . SHOULDER ARTHROSCOPY WITH OPEN ROTATOR CUFF REPAIR Right 01/16/2017   Procedure: SHOULDER ARTHROSCOPY WITH OPEN ROTATOR CUFF REPAIR;  Surgeon: Thornton Park, MD;  Location: [facility];  Service: Orthopedics;  Laterality: Right;  . TONSILLECTOMY AND ADENOIDECTOMY  1971    SOCIAL HISTORY:   Social History   Tobacco Use  . Smoking status: Former Smoker    Packs/day: 0.50    Years: 30.00    Pack years: 15.00    Types: Cigarettes    Quit date: 07/25/2018    Years since quitting: 0.5  . Smokeless tobacco: Never Used  . Tobacco comment: Currently smoking 1/2 ppd  Substance Use Topics  . Alcohol use: No    Alcohol/week: 0.0 standard drinks    FAMILY HISTORY:   Family History  Problem Relation Age of Onset  . Arthritis Mother   . Mental illness Mother  depression  . Diabetes Mother   . Cancer Mother        pancreatic  . Cancer Father        colon  . Heart disease Father   . Diabetes Father   . Cancer Daughter        lung    DRUG ALLERGIES:  No Known Allergies  REVIEW OF SYSTEMS:  Lethargic/  MEDICATIONS AT HOME:   Prior to Admission medications   Medication Sig Start Date End Date Taking? Authorizing Provider  acetaminophen (TYLENOL) 325 MG tablet Take 2 tablets (650 mg total) by mouth every 6 (six)  hours as needed for mild pain (or Fever >/= 101). 11/03/18  Yes Stegmayer, Joelene Millin A, PA-C  atorvastatin (LIPITOR) 40 MG tablet Take 1 tablet (40 mg total) by mouth daily. 04/10/18  Yes [name], [name], MD  cholecalciferol (VITAMIN D3) 25 MCG (1000 UT) tablet Take 1 tablet (1,000 Units total) by mouth daily. 11/04/18  Yes Stegmayer, Janalyn Harder, PA-C  clonazepam (KLONOPIN) 0.125 MG disintegrating tablet Take 0.125 mg by mouth at bedtime.   Yes [provider]  clonazepam (KLONOPIN) 0.125 MG disintegrating tablet Take 0.125 mg by mouth every 8 (eight) hours as needed (anxiety).   Yes [provider]  clopidogrel (PLAVIX) 75 MG tablet TAKE 1 TABLET BY MOUTH ONCE DAILY 12/23/17  Yes Schnier, Dolores Lory, MD  docusate sodium (COLACE) 100 MG capsule Take 2 capsules (200 mg total) by mouth daily as needed for mild constipation or moderate constipation. 11/03/18  Yes Stegmayer, Joelene Millin A, PA-C  escitalopram (LEXAPRO) 5 MG tablet Take 1 tablet (5 mg total) by mouth daily. 11/04/18  Yes Stegmayer, Joelene Millin A, PA-C  feeding supplement, GLUCERNA SHAKE, (GLUCERNA SHAKE) LIQD Take 237 mLs by mouth 2 (two) times daily between meals. 11/03/18  Yes Stegmayer, Joelene Millin A, PA-C  ferrous sulfate 325 (65 FE) MG tablet Take 325 mg by mouth 2 (two) times daily.    Yes [provider]  lactulose (CHRONULAC) 10 GM/15ML solution Take 20 g by mouth 2 (two) times daily as needed for mild constipation.   Yes [provider]  levofloxacin (LEVAQUIN) 500 MG tablet Take 500 mg by mouth daily.   Yes [provider]  lisinopril (PRINIVIL,ZESTRIL) 10 MG tablet Take 1 tablet (10 mg total) by mouth daily. 04/23/18  Yes [name], [name], MD  LORazepam (ATIVAN) 0.5 MG tablet Take 0.5 mg by mouth every 6 (six) hours as needed for anxiety.   Yes [provider]  Melatonin 5 MG TABS Take 5 mg by mouth at bedtime.   Yes [provider]  methadone (DOLOPHINE) 10 MG/ML  solution Take 8 mLs (80 mg total) by mouth daily. Patient taking differently: Take 70 mg by mouth daily.  11/04/18  Yes Stegmayer, Janalyn Harder, PA-C  Multiple Vitamins-Iron (MULTIVITAMINS WITH IRON) TABS tablet Take 1 tablet by mouth daily. 11/04/18  Yes Stegmayer, Joelene Millin A, PA-C  polyethylene glycol (MIRALAX / GLYCOLAX) 17 g packet Take 17 g by mouth daily as needed for severe constipation. Patient taking differently: Take 17 g by mouth daily.  11/03/18  Yes Stegmayer, Janalyn Harder, PA-C  traMADol (ULTRAM) 50 MG tablet Take 50 mg by mouth every 4 (four) hours as needed for moderate pain.   Yes [provider]  traZODone (DESYREL) 50 MG tablet Take 100 mg by mouth at bedtime.   Yes [provider]  vitamin C (VITAMIN C) 250 MG tablet Take 1 tablet (250 mg total) by mouth  2 (two) times daily. 11/03/18  Yes Stegmayer, Joelene Millin A, PA-C  ARIPiprazole (ABILIFY) 2 MG tablet Take 1 tablet (2 mg total) by mouth at bedtime. Patient not taking: Reported on 12/23/2018 11/03/18   Stegmayer, Joelene Millin A, PA-C  potassium chloride SA (K-DUR) 20 MEQ tablet Take 2 tablets (40 mEq total) by mouth daily. Patient not taking: Reported on 12/09/2018 11/04/18   Stegmayer, Joelene Millin A, PA-C      VITAL SIGNS:  Blood pressure (!) 109/54, pulse 75, temperature 98.3 F (36.8 C), temperature source Oral, resp. rate 15, height 5\' 6"  (1.676 m), weight 54 kg, SpO2 98 %.  PHYSICAL EXAMINATION:  GENERAL:  68 y.o.-year-old patient who is cachectic lying in the ER bed.  Lethargic but able to answer some of my questions appropriately.  Complains of back pain.   EYES: Pupils equal, round, reactive to light . No scleral icterus. Extraocular muscles intact.  HEENT: Head atraumatic, normocephalic. Oropharynx and nasopharynx clear.  NECK:  Supple, no jugular venous distention. No thyroid enlargement, no tenderness.  LUNGS: Clear to auscultation no wheeze, no rales. CARDIOVASCULAR: S1, S2 normal. No murmurs, rubs, or gallops.   ABDOMEN: Soft, nontender, nondistended. Bowel sounds present. No organomegaly or mass.  EXTREMITIES: No pedal edema, cyanosis, or clubbing.  Patient has bilateral feet ulcers in different stages of healing.   NEUROLOGIC: Cranial nerves II through XII are intact. Muscle strength 5/5 in all extremities. Sensation intact. Gait not checked.  PSYCHIATRIC: The patient is alert and oriented x 3.  SKIN: Patient has sacral decubiti with mucopurulent discharge. LABORATORY PANEL:   CBC Recent Labs  Lab 01/28/19 1113  WBC 17.6*  HGB 7.0*  HCT 23.3*  PLT 680*   ------------------------------------------------------------------------------------------------------------------  Chemistries  Recent Labs  Lab 01/28/19 1113  NA 140  K 5.0  CL 109  CO2 23  GLUCOSE 175*  BUN 29*  CREATININE 1.64*  CALCIUM 8.6*  AST 16  ALT 15  ALKPHOS 109  BILITOT 0.6   ------------------------------------------------------------------------------------------------------------------  Cardiac Enzymes No results for input(s): TROPONINI in the last 168 hours. ------------------------------------------------------------------------------------------------------------------  RADIOLOGY:  Dg Chest Portable 1 View  Result Date: 01/28/2019 CLINICAL DATA:  Hypotension for the past few days. EXAM: PORTABLE CHEST 1 VIEW COMPARISON:  None. FINDINGS: Lungs clear. Skin folds over both the right and left chest noted. No pneumothorax or pleural fluid. Heart size is upper normal. Aortic atherosclerosis. No acute or focal bony abnormality. IMPRESSION: No acute disease. Atherosclerosis. Electronically Signed   By: Inge Rise M.D.   On: 01/28/2019 11:48    EKG:   Orders placed or performed during the hospital encounter of 01/28/19  . EKG 12-Lead  . EKG 12-Lead    IMPRESSION AND PLAN:   68 year old female patient with multiple medical problems of hepatitis C, chronic pain syndrome, anxiety, essential  hypertension brought in from PICU/nursing home because of hypotension, infected decubitus ulcers in the sacrum. #1 .  Clinical sepsis with hypotension, leukocytosis, in likely secondary to infected sacral decubiti, continue aggressive hydration, antibiotics with vancomycin, cefepime, surgical consult requested to see if patient needs any debridement. 2.  Anemia of chronic disease hemoglobin 7, hold off on transfusion, 3.  Acute kidney injury secondary to sepsis continue aggressive hydration 4.  Mild hyperkalemia stop potassium supplements and recheck potassium in the morning 5.  Severe malnutrition consult dietitian, continue continue Ensure, possible dysphagia consult speech therapy 6.  Hep C with chronic pain syndrome, anxiety.  Patient on methadone, Klonopin reordered them. 7.  Multiple ulcers in  both feet and differentials of healing, consult wound care nurse. All the records are reviewed and case discussed with ED provider. Management plans discussed with the patient, family and they are in agreement.  CODE STATUS: Full code   TOTAL TIME TAKING CARE OF THIS PATIENT: 55 minutes.    Epifanio Lesches M.D on 01/28/2019 at 2:20 PM  Between 7am to 6pm - Pager - [phone]  After 6pm go to [email or web address] - password [email or web address]  Office  [phone]  CC: Primary care physician; [name], [name], MD  Note: This dictation was prepared with Dragon dictation along with smaller phrase technology. Any transcriptional errors that result from this process are unintentional.

## 2019-01-28 NOTE — ED Notes (Signed)
Report given to 1A RN

## 2019-01-28 NOTE — ED Provider Notes (Signed)
[facility] Emergency Department Provider Note  ____________________________________________   First MD Initiated Contact with Patient 01/28/19 1047     (approximate)  I have reviewed the triage vital signs and the nursing notes.   HISTORY  Chief Complaint Hypotension    HPI Yesenia Shepard is a 68 y.o. female with past medical history as below including hepatitis C, and history of drug abuse, peripheral vascular disease, history of UTIs, here with reported hypotension.  The patient states that she has recent increase in her back pain around her sacral decubitus site, as well as generalized fatigue over the last several days.  Denies any known fevers.  She denies any abdominal pain or dysuria.  She states that she was sent here due to concerns for worsening anemia as well as low blood pressure at the facility.  Per report from EMS and the facility, the patient's blood pressure is been running low for the last 24 to 48 hours.  She also had a urinalysis concerning for UTI yesterday.  Patient was also noted to have a hemoglobin of 6.1, and she has had transfusions in the past.  She denies any known blood in her stools, though she is severely debilitated due to her underlying conditions.  Denies any specific alleviating or aggravating factors.  No other acute complaints.        Past Medical History:  Diagnosis Date  . Anxiety   . Arthritis   . Chronic pain   . Depression   . Diabetes mellitus without complication ([place])   . Drug abuse ([place])    History of polysubstance abuse; currently on methadone.  . Hepatitis    History of Hep "C". treated and cured with Harvoni  . History of chicken pox   . Hypertension   . Panic attacks   . Peripheral vascular disease ([place])    stent in place.   Marland Kitchen UTI (urinary tract infection)    History of    Patient Active Problem List   Diagnosis Date Noted  . Pressure injury of skin 12/24/2018  . Sepsis ([place]) 12/22/2018  . CKD  (chronic kidney disease), stage IV ([place]) 12/22/2018  . UTI (urinary tract infection) 12/22/2018  . Elevated CK 12/22/2018  . Atherosclerotic peripheral vascular disease with ulceration ([place]) 10/24/2018  . Atherosclerosis of artery of extremity with ulceration ([place]) 10/24/2018  . Atherosclerosis of native arteries of extremity with rest pain ([place]) 10/01/2018  . Chronic midline low back pain with bilateral sciatica 04/23/2018  . Vitamin D deficiency 04/10/2018  . Swelling of limb 03/01/2018  . Atherosclerosis of native arteries of extremity with intermittent claudication ([place]) 01/03/2018  . Leg pain 12/12/2017  . S/P right rotator cuff repair 01/16/2017  . Preoperative examination 01/14/2017  . Right shoulder pain 09/06/2016  . Hepatic cirrhosis ([place]) 02/09/2016  . Chronic hepatitis C without hepatic coma ([place]) 11/09/2015  . HLD (hyperlipidemia) 08/20/2015  . Peripheral vascular disease ([place]) 08/12/2015  . Essential hypertension 08/12/2015  . DM type 2 (diabetes mellitus, type 2) ([place]) 04/19/2015  . Current smoker 03/15/2015  . History of recreational drug use 03/15/2015  . Osteoarthritis 03/15/2015  . Preventative health care 03/15/2015  . Ulcer of left lower leg, limited to breakdown of skin ([place]) 03/15/2015  . Anxiety and depression 08/25/2012    Past Surgical History:  Procedure Laterality Date  . ABDOMINAL HYSTERECTOMY  1989  . AMPUTATION TOE Left 10/26/2018   Procedure: 5TH RAY RESCTION;  Surgeon: Albertine Patricia, DPM;  Location:  [facility] ORS;  Service: Podiatry;  Laterality: Left;  . CHOLECYSTECTOMY  1987  . INTRACAPSULAR CATARACT EXTRACTION Left   . LOWER EXTREMITY ANGIOGRAPHY Left 12/17/2017   Procedure: LOWER EXTREMITY ANGIOGRAPHY;  Surgeon: Katha Cabal, MD;  Location: [place] [facility];  Service: Cardiovascular;  Laterality: Left;  . LOWER EXTREMITY ANGIOGRAPHY Left 10/24/2018   Procedure: LOWER EXTREMITY ANGIOGRAPHY;  Surgeon: Algernon Huxley, MD;  Location: [place] [facility];  Service: Cardiovascular;  Laterality: Left;  . PERIPHERAL VASCULAR CATHETERIZATION Left 05/04/2015   Procedure: Lower Extremity Angiography;  Surgeon: Katha Cabal, MD;  Location: [place] [facility];  Service: Cardiovascular;  Laterality: Left;  . PERIPHERAL VASCULAR CATHETERIZATION Left 05/04/2015   Procedure: Lower Extremity Intervention;  Surgeon: Katha Cabal, MD;  Location: [place] [facility];  Service: Cardiovascular;  Laterality: Left;  . SHOULDER ARTHROSCOPY WITH OPEN ROTATOR CUFF REPAIR Right 01/16/2017   Procedure: SHOULDER ARTHROSCOPY WITH OPEN ROTATOR CUFF REPAIR;  Surgeon: Thornton Park, MD;  Location: [facility] ORS;  Service: Orthopedics;  Laterality: Right;  . TONSILLECTOMY AND ADENOIDECTOMY  1971    Prior to Admission medications   Medication Sig Start Date End Date Taking? Authorizing Provider  acetaminophen (TYLENOL) 325 MG tablet Take 2 tablets (650 mg total) by mouth every 6 (six) hours as needed for mild pain (or Fever >/= 101). 11/03/18  Yes Stegmayer, Joelene Millin A, PA-C  atorvastatin (LIPITOR) 40 MG tablet Take 1 tablet (40 mg total) by mouth daily. 04/10/18  Yes [name], [name], MD  cholecalciferol (VITAMIN D3) 25 MCG (1000 UT) tablet Take 1 tablet (1,000 Units total) by mouth daily. 11/04/18  Yes Stegmayer, Janalyn Harder, PA-C  clonazepam (KLONOPIN) 0.125 MG disintegrating tablet Take 0.125 mg by mouth at bedtime.   Yes [provider]  clonazepam (KLONOPIN) 0.125 MG disintegrating tablet Take 0.125 mg by mouth every 8 (eight) hours as needed (anxiety).   Yes [provider]  clopidogrel (PLAVIX) 75 MG tablet TAKE 1 TABLET BY MOUTH ONCE DAILY 12/23/17  Yes Schnier, Dolores Lory, MD  docusate sodium (COLACE) 100 MG capsule Take 2 capsules (200 mg total) by mouth daily as needed for mild constipation or moderate constipation. 11/03/18  Yes Stegmayer, Joelene Millin A, PA-C  escitalopram (LEXAPRO) 5 MG tablet Take 1 tablet (5 mg total) by  mouth daily. 11/04/18  Yes Stegmayer, Joelene Millin A, PA-C  feeding supplement, GLUCERNA SHAKE, (GLUCERNA SHAKE) LIQD Take 237 mLs by mouth 2 (two) times daily between meals. 11/03/18  Yes Stegmayer, Joelene Millin A, PA-C  ferrous sulfate 325 (65 FE) MG tablet Take 325 mg by mouth 2 (two) times daily.    Yes [provider]  lactulose (CHRONULAC) 10 GM/15ML solution Take 20 g by mouth 2 (two) times daily as needed for mild constipation.   Yes [provider]  levofloxacin (LEVAQUIN) 500 MG tablet Take 500 mg by mouth daily.   Yes [provider]  lisinopril (PRINIVIL,ZESTRIL) 10 MG tablet Take 1 tablet (10 mg total) by mouth daily. 04/23/18  Yes [name], [name], MD  LORazepam (ATIVAN) 0.5 MG tablet Take 0.5 mg by mouth every 6 (six) hours as needed for anxiety.   Yes [provider]  Melatonin 5 MG TABS Take 5 mg by mouth at bedtime.   Yes [provider]  methadone (DOLOPHINE) 10 MG/ML solution Take 8 mLs (80 mg total) by mouth daily. Patient taking differently: Take 70 mg by mouth daily.  11/04/18  Yes Stegmayer, Janalyn Harder, PA-C  Multiple Vitamins-Iron (  MULTIVITAMINS WITH IRON) TABS tablet Take 1 tablet by mouth daily. 11/04/18  Yes Stegmayer, Joelene Millin A, PA-C  polyethylene glycol (MIRALAX / GLYCOLAX) 17 g packet Take 17 g by mouth daily as needed for severe constipation. Patient taking differently: Take 17 g by mouth daily.  11/03/18  Yes Stegmayer, Janalyn Harder, PA-C  traMADol (ULTRAM) 50 MG tablet Take 50 mg by mouth every 4 (four) hours as needed for moderate pain.   Yes [provider]  traZODone (DESYREL) 50 MG tablet Take 100 mg by mouth at bedtime.   Yes [provider]  vitamin C (VITAMIN C) 250 MG tablet Take 1 tablet (250 mg total) by mouth 2 (two) times daily. 11/03/18  Yes Stegmayer, Joelene Millin A, PA-C  ARIPiprazole (ABILIFY) 2 MG tablet Take 1 tablet (2 mg total) by mouth at bedtime. Patient not taking: Reported on 12/23/2018  11/03/18   Stegmayer, Joelene Millin A, PA-C  potassium chloride SA (K-DUR) 20 MEQ tablet Take 2 tablets (40 mEq total) by mouth daily. Patient not taking: Reported on 12/09/2018 11/04/18   Sela Hua, PA-C    Allergies Patient has no known allergies.  Family History  Problem Relation Age of Onset  . Arthritis Mother   . Mental illness Mother        depression  . Diabetes Mother   . Cancer Mother        pancreatic  . Cancer Father        colon  . Heart disease Father   . Diabetes Father   . Cancer Daughter        lung    Social History Social History   Tobacco Use  . Smoking status: Former Smoker    Packs/day: 0.50    Years: 30.00    Pack years: 15.00    Types: Cigarettes    Quit date: 07/25/2018    Years since quitting: 0.5  . Smokeless tobacco: Never Used  . Tobacco comment: Currently smoking 1/2 ppd  Substance Use Topics  . Alcohol use: No    Alcohol/week: 0.0 standard drinks  . Drug use: Not Currently    Comment: Hx of.    Review of Systems  Review of Systems  Constitutional: Positive for fatigue. Negative for fever.  HENT: Negative for congestion and sore throat.   Eyes: Negative for visual disturbance.  Respiratory: Negative for cough and shortness of breath.   Cardiovascular: Negative for chest pain.  Gastrointestinal: Negative for abdominal pain, diarrhea, nausea and vomiting.  Genitourinary: Negative for flank pain.  Musculoskeletal: Positive for gait problem. Negative for back pain and neck pain.  Skin: Positive for wound. Negative for rash.  Neurological: Positive for weakness.  All other systems reviewed and are negative.    ____________________________________________  PHYSICAL EXAM:      VITAL SIGNS: ED Triage Vitals  Enc Vitals Group     BP 01/28/19 1049 (!) 116/57     Pulse Rate 01/28/19 1049 87     Resp 01/28/19 1049 16     Temp 01/28/19 1046 98.3 F (36.8 C)     Temp Source 01/28/19 1046 Oral     SpO2 01/28/19 1049 98 %      Weight 01/28/19 1049 119 lb (54 kg)     Height 01/28/19 1049 5\' 6"  (1.676 m)     Head Circumference --      [facility] Flow --      Pain Score 01/28/19 1048 9     Pain Loc --  Pain Edu? --      Excl. in [place]? --      Physical Exam Vitals signs and nursing note reviewed.  Constitutional:      Appearance: She is well-developed.     Comments: Cachectic, chronically ill appearing  HENT:     Head: Normocephalic and atraumatic.     Mouth/Throat:     Mouth: Mucous membranes are dry.  Eyes:     Conjunctiva/sclera: Conjunctivae normal.  Neck:     Musculoskeletal: Neck supple.  Cardiovascular:     Rate and Rhythm: Normal rate and regular rhythm.     Heart sounds: Normal heart sounds. No murmur. No friction rub.  Pulmonary:     Effort: Pulmonary effort is normal. No respiratory distress.     Breath sounds: Normal breath sounds. No wheezing or rales.  Abdominal:     General: Abdomen is flat. There is no distension.     Palpations: Abdomen is soft.     Tenderness: There is no abdominal tenderness.  Skin:    General: Skin is warm.     Capillary Refill: Capillary refill takes less than 2 seconds.     Comments: Stage IV/non stageable decubitus ulcer as per below. No foul odor. Packing does have small amount of purulent drainage noted. Left foot with stage IV ulcers along lateral aspect. No erythema. Diffuse TTP b/l UE, LE, shoulder.  Neurological:     Mental Status: She is alert and oriented to person, place, and time.     Motor: No abnormal muscle tone.         ____________________________________________   LABS (all labs ordered are listed, but only abnormal results are displayed)  Labs Reviewed  CBC WITH DIFFERENTIAL/PLATELET - Abnormal; Notable for the following components:      Result Value   WBC 17.6 (*)    RBC 2.44 (*)    Hemoglobin 7.0 (*)    HCT 23.3 (*)    Platelets 680 (*)    Neutro Abs 15.8 (*)    Abs Immature Granulocytes 0.10 (*)    All other components within  normal limits  COMPREHENSIVE METABOLIC PANEL - Abnormal; Notable for the following components:   Glucose, Bld 175 (*)    BUN 29 (*)    Creatinine, Ser 1.64 (*)    Calcium 8.6 (*)    Albumin 2.3 (*)    GFR calc non Af Amer 32 (*)    GFR calc Af Amer 37 (*)    All other components within normal limits  URINALYSIS, COMPLETE (UACMP) WITH MICROSCOPIC - Abnormal; Notable for the following components:   Color, Urine AMBER (*)    APPearance TURBID (*)    Hgb urine dipstick SMALL (*)    Protein, ur 100 (*)    Leukocytes,Ua LARGE (*)    WBC, UA >50 (*)    Bacteria, UA MANY (*)    All other components within normal limits  PROTIME-INR - Abnormal; Notable for the following components:   Prothrombin Time 15.7 (*)    INR 1.3 (*)    All other components within normal limits  SARS CORONAVIRUS 2 (HOSPITAL ORDER, [place])  CULTURE, BLOOD (ROUTINE X 2)  CULTURE, BLOOD (ROUTINE X 2)  URINE CULTURE  LACTIC ACID, PLASMA  LACTIC ACID, PLASMA  TYPE AND SCREEN    ____________________________________________  EKG: Normal sinus rhythm, ventricular rate 67.  PR 125, QRS 84, QTc 425.  No acute ST or T-segment changes. ________________________________________  [id number]  All imaging, including plain films, CT scans, and ultrasounds, independently reviewed by me, and interpretations confirmed via formal radiology reads.  ED MD interpretation:   CXR: No acute disease  Official radiology report(s): Dg Chest Portable 1 View  Result Date: 01/28/2019 CLINICAL DATA:  Hypotension for the past few days. EXAM: PORTABLE CHEST 1 VIEW COMPARISON:  None. FINDINGS: Lungs clear. Skin folds over both the right and left chest noted. No pneumothorax or pleural fluid. Heart size is upper normal. Aortic atherosclerosis. No acute or focal bony abnormality. IMPRESSION: No acute disease. Atherosclerosis. Electronically Signed   By: Inge Rise M.D.   On: 01/28/2019 11:48     ____________________________________________  PROCEDURES   Procedure(s) performed (including Critical Care):  Procedures  ____________________________________________  INITIAL IMPRESSION / MDM / [place] / ED COURSE  As part of my medical decision making, I reviewed the following data within the electronic MEDICAL RECORD NUMBER Notes from prior ED visits and [place] Controlled Substance Database      *Samera R Hafen was evaluated in Emergency Department on 01/28/2019 for the symptoms described in the history of present illness. She was evaluated in the context of the global COVID-19 pandemic, which necessitated consideration that the patient might be at risk for infection with the SARS-CoV-2 virus that causes COVID-19. Institutional protocols and algorithms that pertain to the evaluation of patients at risk for COVID-19 are in a state of rapid change based on information released by regulatory bodies including the CDC and federal and state organizations. These policies and algorithms were followed during the patient's care in the ED.  Some ED evaluations and interventions may be delayed as a result of limited staffing during the pandemic.*      Medical Decision Making: 68 yo F here with generalized weakness, hypotension. Suspect dehydration, possible sepsis 2/2 UTI vs infected decub ulcer. Mild hypotension noted here but MAP>65. Labs show significant leukocytosis w/ left shift, mild acute on chronic anemia, and mild AI. UA c/w UTI. Vanc/cefepime started, admit to medicine. ____________________________________________  FINAL CLINICAL IMPRESSION(S) / ED DIAGNOSES  Final diagnoses:  Acute cystitis with hematuria  AKI (acute kidney injury) ([place])  Weakness  Pressure injury of sacral region, unstageable ([place])     MEDICATIONS GIVEN DURING THIS VISIT:  Medications - No data to display   ED Discharge Orders    None       Note:  This document was prepared using Dragon voice  recognition software and may include unintentional dictation errors.   Duffy Bruce, MD 01/28/19 1228

## 2019-01-28 NOTE — Progress Notes (Signed)
Initial Nutrition Assessment  DOCUMENTATION CODES:   Not applicable  INTERVENTION:   Nepro Shake po BID, each supplement provides 425 kcal and 19 grams protein  MVI daily   Vitamin C 250mg  po BID   Dysphagia 3 diet   Consider megace 300mg  po BID if appetite does not improve.   NUTRITION DIAGNOSIS:   Increased nutrient needs related to wound healing as evidenced by increased estimated needs.  GOAL:   Patient will meet greater than or equal to 90% of their needs  MONITOR:   PO intake, Supplement acceptance, Labs, Weight trends, Skin, I & O's  REASON FOR ASSESSMENT:   Consult Wound healing  ASSESSMENT:   68 year old female patient with multiple medical problems of hepatitis C, chronic pain syndrome, anxiety, essential hypertension brought in from [facility] because of hypotension, infected decubitus ulcers in the sacrum.  RD working remotely.  RD familiar with this from multiple recent admits; pt last seen by this RD ~ 1 month ago. Pt with poor appetite and oral intake at baseline. Pt eating <25% of meals during her last admit. Pt enjoys drinking Glucerna and was drinking these at home at one point. RD will discontinue Glucerna and add Nepro as this is also carb steady but will provide more calories and protein. Pt was compliant with Nepro during her last admit. Recommend continue vitamins to support wound healing. May need to consider megace if oral intake does not improve. Per chart, pt has lost 60lbs(34%) over the past 6 months and 30lbs(20%) over the past 3 months; this is severe weight loss.   Pt at high risk for malnutrition but unable to diagnose at this time as NFPE cannot be performed.    Medications reviewed and include: D3, colace, heparin, melatonin, MVI, Vitamin C, NaCl @100ml /hr, pepcid, cefepime, vancomycin    Labs reviewed: BUN 29(H), creat 1.64(H) Wbc- 17.6(H), Hgb 7.0(L), Hct 23.3(L)  Unable to complete Nutrition-Focused physical exam at this  time.   Diet Order:   Diet Order            Diet regular Room service appropriate? Yes; Fluid consistency: Thin  Diet effective now             EDUCATION NEEDS:   No education needs have been identified at this time  Skin:  Skin Assessment: Reviewed RN Assessment(sacral wound, wounds L foot)  Last BM:  pta  Height:   Ht Readings from Last 1 Encounters:  01/28/19 5\' 6"  (1.676 m)    Weight:   Wt Readings from Last 1 Encounters:  01/28/19 54 kg    Ideal Body Weight:  59 kg  BMI:  Body mass index is 19.21 kg/m.  Estimated Nutritional Needs:   Kcal:  1500-1700kcal/day  Protein:  80-90g/day  Fluid:  >1.4L/day  Koleen Distance MS, RD, LDN Pager #- [phone] Office#- [phone] After Hours Pager: [phone]

## 2019-01-28 NOTE — Progress Notes (Signed)
PHARMACY -  BRIEF ANTIBIOTIC NOTE   Pharmacy has received consult(s) for Dr. Ellender Hose from an ED provider.  The patient's profile has been reviewed for ht/wt/allergies/indication/available labs.    One time order(s) placed for Vancomycin 1250 mg x1 dose for Sepsis.  Further antibiotics/pharmacy consults should be ordered by admitting physician if indicated.                       Thank you, Rowland Lathe 01/28/2019  12:52 PM

## 2019-01-28 NOTE — Consult Note (Signed)
Pharmacy Antibiotic Note  Yesenia Shepard is a 68 y.o. female admitted on 01/28/2019 with wound infection.  Pharmacy has been consulted for Vancomycin and Cefepime dosing.  Patient received Cefepime 2g x1 and Vancomycin 1250 mg x1 in the ED (not given yet).    Plan: Cefepime 2g Q24H - next dose due 01/29/19 @ 1300  Will order Vancomycin 750 mg Q36H - dose not given yet. Will monitor Scr with AM labs.  AUC Goal: 400-550 Expected AUC: 465.4  Cssmin: 11.6  Height: 5\' 6"  (167.6 cm) Weight: 119 lb (54 kg) IBW/kg (Calculated) : 59.3  Temp (24hrs), Avg:98.3 F (36.8 C), Min:98.3 F (36.8 C), Max:98.3 F (36.8 C)  Recent Labs  Lab 01/28/19 1113 01/28/19 1114  WBC 17.6*  --   CREATININE 1.64*  --   LATICACIDVEN  --  0.9    Estimated Creatinine Clearance: 28 mL/min (A) (by C-G formula based on SCr of 1.64 mg/dL (H)).    No Known Allergies  Antimicrobials this admission: 7/22 Cefepime >> 7/22 Vancomycin >>   Dose adjustments this admission: N/A  Microbiology results: 7/22 BCx: pending  7/22 UCx: pending    Thank you for allowing pharmacy to be a part of this patient's care.  Rowland Lathe 01/28/2019 1:10 PM

## 2019-01-28 NOTE — Progress Notes (Signed)
PHARMACY -  BRIEF ANTIBIOTIC NOTE   Pharmacy has received consult(s) for Dr. Ellender Hose from an ED provider.  The patient's profile has been reviewed for ht/wt/allergies/indication/available labs.    One time order(s) placed for Cefepime 2g x1 dose for UTI  Further antibiotics/pharmacy consults should be ordered by admitting physician if indicated.                       Thank you, Rowland Lathe 01/28/2019  12:36 PM

## 2019-01-29 LAB — BASIC METABOLIC PANEL
Anion gap: 4 — ABNORMAL LOW (ref 5–15)
BUN: 28 mg/dL — ABNORMAL HIGH (ref 8–23)
CO2: 23 mmol/L (ref 22–32)
Calcium: 7.8 mg/dL — ABNORMAL LOW (ref 8.9–10.3)
Chloride: 115 mmol/L — ABNORMAL HIGH (ref 98–111)
Creatinine, Ser: 1.54 mg/dL — ABNORMAL HIGH (ref 0.44–1.00)
GFR calc Af Amer: 40 mL/min — ABNORMAL LOW (ref >60)
GFR calc non Af Amer: 34 mL/min — ABNORMAL LOW (ref >60)
Glucose, Bld: 88 mg/dL (ref 70–99)
Potassium: 4.4 mmol/L (ref 3.5–5.1)
Sodium: 142 mmol/L (ref 135–145)

## 2019-01-29 LAB — IRON AND TIBC
Iron: 15 ug/dL — ABNORMAL LOW (ref 28–170)
Saturation Ratios: 11 % (ref 10.4–31.8)
TIBC: 131 ug/dL — ABNORMAL LOW (ref 250–450)
UIBC: 117 ug/dL

## 2019-01-29 LAB — CBC
HCT: 19.4 % — ABNORMAL LOW (ref 36.0–46.0)
Hemoglobin: 5.7 g/dL — ABNORMAL LOW (ref 12.0–15.0)
MCH: 28.2 pg (ref 26.0–34.0)
MCHC: 29.4 g/dL — ABNORMAL LOW (ref 30.0–36.0)
MCV: 96 fL (ref 80.0–100.0)
Platelets: 536 10*3/uL — ABNORMAL HIGH (ref 150–400)
RBC: 2.02 MIL/uL — ABNORMAL LOW (ref 3.87–5.11)
RDW: 14.5 % (ref 11.5–15.5)
WBC: 15.1 10*3/uL — ABNORMAL HIGH (ref 4.0–10.5)
nRBC: 0 % (ref 0.0–0.2)

## 2019-01-29 LAB — RETICULOCYTES
Immature Retic Fract: 23.6 % — ABNORMAL HIGH (ref 2.3–15.9)
RBC.: 2.02 MIL/uL — ABNORMAL LOW (ref 3.87–5.11)
Retic Count, Absolute: 78.8 10*3/uL (ref 19.0–186.0)
Retic Ct Pct: 3.9 % — ABNORMAL HIGH (ref 0.4–3.1)

## 2019-01-29 LAB — PREPARE RBC (CROSSMATCH)

## 2019-01-29 LAB — URINE CULTURE

## 2019-01-29 LAB — HEMOGLOBIN AND HEMATOCRIT, BLOOD
HCT: 22.5 % — ABNORMAL LOW (ref 36.0–46.0)
Hemoglobin: 6.9 g/dL — ABNORMAL LOW (ref 12.0–15.0)

## 2019-01-29 LAB — FOLATE: Folate: 3.6 ng/mL — ABNORMAL LOW (ref >5.9)

## 2019-01-29 LAB — FERRITIN: Ferritin: 474 ng/mL — ABNORMAL HIGH (ref 11–307)

## 2019-01-29 LAB — HEMOGLOBIN: Hemoglobin: 8.3 g/dL — ABNORMAL LOW (ref 12.0–15.0)

## 2019-01-29 LAB — MRSA PCR SCREENING: MRSA by PCR: NEGATIVE

## 2019-01-29 LAB — GLUCOSE, CAPILLARY: Glucose-Capillary: 111 mg/dL — ABNORMAL HIGH (ref 70–99)

## 2019-01-29 LAB — VITAMIN B12: Vitamin B-12: 876 pg/mL (ref 180–914)

## 2019-01-29 MED ORDER — METHADONE HCL 10 MG/ML PO CONC
65.0000 mg | Freq: Every day | ORAL | Status: DC
  Administered 2019-01-29 – 2019-02-11 (×14): 65 mg via ORAL
  Filled 2019-01-29 (×14): qty 6.5

## 2019-01-29 MED ORDER — SODIUM CHLORIDE 0.9 % IV SOLN
510.0000 mg | Freq: Once | INTRAVENOUS | Status: AC
  Administered 2019-01-29: 510 mg via INTRAVENOUS
  Filled 2019-01-29: qty 17

## 2019-01-29 MED ORDER — FOLIC ACID 1 MG PO TABS
1.0000 mg | ORAL_TABLET | Freq: Every day | ORAL | Status: DC
  Administered 2019-01-29 – 2019-02-11 (×13): 1 mg via ORAL
  Filled 2019-01-29 (×13): qty 1

## 2019-01-29 MED ORDER — SODIUM CHLORIDE 0.9% IV SOLUTION
Freq: Once | INTRAVENOUS | Status: AC
  Administered 2019-01-29: 10:00:00 via INTRAVENOUS

## 2019-01-29 MED ORDER — SODIUM CHLORIDE 0.9% IV SOLUTION
Freq: Once | INTRAVENOUS | Status: AC
  Administered 2019-01-29: 16:00:00 via INTRAVENOUS

## 2019-01-29 MED ORDER — FAMOTIDINE 20 MG PO TABS
20.0000 mg | ORAL_TABLET | Freq: Every day | ORAL | Status: DC
  Administered 2019-01-29 – 2019-02-11 (×13): 20 mg via ORAL
  Filled 2019-01-29 (×13): qty 1

## 2019-01-29 MED ORDER — DAKINS (1/4 STRENGTH) 0.125 % EX SOLN
Freq: Two times a day (BID) | CUTANEOUS | Status: AC
  Administered 2019-01-29 – 2019-01-31 (×6)
  Filled 2019-01-29 (×2): qty 473

## 2019-01-29 NOTE — Consult Note (Signed)
[facility] Nurse wound consult note Patient receiving care in [facility]. At the current time, the FYIS box contains the following statement:  "POSSIBLE NOVEL CORONAVIRUS (2019-NCOV)". Reason for Consult: sacral decub, wounds to feet bilateral Wound type:Please see MD note from S. Konidena 01/28/19 at 2:20 PM Pressure Injury POA: Yes Measurement: see flowsheet Wound bed: to be provided in flowsheet by primary RN Drainage (amount, consistency, odor)  Periwound: see flowsheet Dressing procedure/placement/frequency: three days of twice daily application of 1/4 strength Dakins solution to the sacrum.  This will help combat microbial contaminants.  Also, I see surgery has been consulted to see the patient for the infected sacral wound.  If the surgical team orders a different therapy for the area, this order can be and should be discontinued.  For the left heel area, what is described is consistent with a DTPI, although I do not find a photo in the record.  This consult is being completed remotely, therefore, I am relying on descriptions.  The treatment for this wound is twice daily application of iodine and use of a prevalon boot.  The right foot wounds are listed as stage 3 in the flowsheet, and for these, I have ordered twice daily application of saline moistened gauze. Wrapped in kerlex.  This approach will support moist wound healing.  Additional care measures include:  Use of prevalon boots bilaterally, an air mattress, and use ONLY ONE DermaTherapy pad beneath patient's buttocks. Do NOT use disposable pads. Even with an air mattress. I have placed an order for the primary care RN to measure and document the wound sizes for the foot wounds. Monitor the wound area(s) for worsening of condition such as: Signs/symptoms of infection,  Increase in size,  Development of or worsening of odor, Development of pain, or increased pain at the affected locations.  Notify the medical team if any of these develop.  Thank  you for the consult. [place] nurse will not follow at this time.  Please re-consult the [facility] team if needed.  Val Riles, RN, MSN, CWOCN, CNS-BC, pager [phone]

## 2019-01-29 NOTE — TOC Progression Note (Signed)
Transition of Care [facility]) - Progression Note    Patient Details  Name: Yesenia Shepard MRN: [id number] Date of Birth: 1951/04/28  Transition of Care [facility] CM/SW Contact  Su Hilt, RN Phone Number: 01/29/2019, 11:54 AM  Clinical Narrative:     Damaris Schooner to Otila Kluver at [facility], she stated that the patient is fine to return to the facility at [facility].  They have been working with [facility] for this patient        Expected Discharge Plan and Services                                                 Social Determinants of Health (SDOH) Interventions    Readmission Risk Interventions No flowsheet data found.

## 2019-01-29 NOTE — NC FL2 (Signed)
[place] LEVEL OF CARE SCREENING TOOL     IDENTIFICATION  Patient Name: Yesenia Shepard Birthdate: Oct 21, 1950 Sex: female Admission Date (Current Location): 01/28/2019  [place] and [place] Number:  Engineering geologist and Address:  [facility], [address] [place], [place] [address]      Provider Number: [id number]  Attending Physician Name and Address:  Bettey Costa, MD  Relative Name and Phone Number:  Coralyn Mark sister [phone]    Current Level of Care: Hospital Recommended Level of Care: [place] Prior Approval Number:    Date Approved/Denied:   PASRR Number: [id number]  Discharge Plan: SNF    Current Diagnoses: Patient Active Problem List   Diagnosis Date Noted  . Decubitus ulcer, infected 01/28/2019  . Pressure injury of skin 12/24/2018  . Sepsis ([place]) 12/22/2018  . CKD (chronic kidney disease), stage IV ([place]) 12/22/2018  . UTI (urinary tract infection) 12/22/2018  . Elevated CK 12/22/2018  . Atherosclerotic peripheral vascular disease with ulceration ([place]) 10/24/2018  . Atherosclerosis of artery of extremity with ulceration ([place]) 10/24/2018  . Atherosclerosis of native arteries of extremity with rest pain ([place]) 10/01/2018  . Chronic midline low back pain with bilateral sciatica 04/23/2018  . Vitamin D deficiency 04/10/2018  . Swelling of limb 03/01/2018  . Atherosclerosis of native arteries of extremity with intermittent claudication ([place]) 01/03/2018  . Leg pain 12/12/2017  . S/P right rotator cuff repair 01/16/2017  . Preoperative examination 01/14/2017  . Right shoulder pain 09/06/2016  . Hepatic cirrhosis ([place]) 02/09/2016  . Chronic hepatitis C without hepatic coma ([place]) 11/09/2015  . HLD (hyperlipidemia) 08/20/2015  . Peripheral vascular disease ([place]) 08/12/2015  . Essential hypertension 08/12/2015  . DM type 2 (diabetes mellitus, type 2) ([place]) 04/19/2015  . Current smoker 03/15/2015  . History  of recreational drug use 03/15/2015  . Osteoarthritis 03/15/2015  . Preventative health care 03/15/2015  . Ulcer of left lower leg, limited to breakdown of skin ([place]) 03/15/2015  . Anxiety and depression 08/25/2012    Orientation RESPIRATION BLADDER Height & Weight     Self, Place  Normal Incontinent Weight: 55.1 kg Height:  5\' 6"  (167.6 cm)  BEHAVIORAL SYMPTOMS/MOOD NEUROLOGICAL BOWEL NUTRITION STATUS      Incontinent Diet(regular)  AMBULATORY STATUS COMMUNICATION OF NEEDS Skin   Total Care Verbally Other (Comment)(STage 4 sacral wound, wound on left foot deep tissue wound on right foot)                       Personal Care Assistance Level of Assistance  Total care       Total Care Assistance: Limited assistance   Functional Limitations Info  Sight, Hearing, Speech Sight Info: Impaired Hearing Info: Adequate Speech Info: Adequate    SPECIAL CARE FACTORS FREQUENCY  PT (By licensed PT)     PT Frequency: 5 times per week              Contractures Contractures Info: Not present    Additional Factors Info  Code Status, Allergies Code Status Info: DNR Allergies Info: nkda           Current Medications (01/29/2019):  This is the current hospital active medication list Current Facility-Administered Medications  Medication Dose Route Frequency Provider Last Rate Last Dose  . 0.9 %  sodium chloride infusion   Intravenous Continuous Epifanio Lesches, MD 100 mL/hr at 01/29/19 0327    . acetaminophen (TYLENOL) tablet 650 mg  650  mg Oral Q6H PRN Epifanio Lesches, MD       Or  . acetaminophen (TYLENOL) suppository 650 mg  650 mg Rectal Q6H PRN Epifanio Lesches, MD      . bisacodyl (DULCOLAX) EC tablet 5 mg  5 mg Oral Daily PRN Epifanio Lesches, MD      . ceFEPIme (MAXIPIME) 2 g in sodium chloride 0.9 % 100 mL IVPB  2 g Intravenous Q24H Rowland Lathe, RPH      . cholecalciferol (VITAMIN D3) tablet 1,000 Units  1,000 Units Oral Daily Epifanio Lesches, MD   1,000 Units at 01/29/19 1024  . clonazepam (KLONOPIN) disintegrating tablet 0.125 mg  0.125 mg Oral Q8H PRN Epifanio Lesches, MD      . docusate sodium (COLACE) capsule 100 mg  100 mg Oral BID Epifanio Lesches, MD   100 mg at 01/29/19 1024  . docusate sodium (COLACE) capsule 200 mg  200 mg Oral Daily PRN Epifanio Lesches, MD      . escitalopram (LEXAPRO) tablet 5 mg  5 mg Oral Daily Epifanio Lesches, MD   5 mg at 01/29/19 1024  . famotidine (PEPCID) IVPB 20 mg premix  20 mg Intravenous Q24H Epifanio Lesches, MD 100 mL/hr at 01/28/19 1555 20 mg at 01/28/19 1555  . feeding supplement (NEPRO CARB STEADY) liquid 237 mL  237 mL Oral BID BM Epifanio Lesches, MD   237 mL at 01/29/19 1037  . ferumoxytol (FERAHEME) 510 mg in sodium chloride 0.9 % 100 mL IVPB  510 mg Intravenous Once Bettey Costa, MD      . folic acid (FOLVITE) tablet 1 mg  1 mg Oral Daily Mody, Sital, MD      . heparin injection 5,000 Units  5,000 Units Subcutaneous Q8H Epifanio Lesches, MD   5,000 Units at 01/29/19 0515  . HYDROcodone-acetaminophen (NORCO/VICODIN) 5-325 MG per tablet 1 tablet  1 tablet Oral Q4H PRN Epifanio Lesches, MD   1 tablet at 01/29/19 1042  . MEDLINE mouth rinse  15 mL Mouth Rinse BID Epifanio Lesches, MD   15 mL at 01/29/19 1037  . Melatonin TABS 5 mg  5 mg Oral QHS Epifanio Lesches, MD   5 mg at 01/28/19 2254  . multivitamins with iron tablet 1 tablet  1 tablet Oral Daily Epifanio Lesches, MD   1 tablet at 01/29/19 1024  . ondansetron (ZOFRAN) tablet 4 mg  4 mg Oral Q6H PRN Epifanio Lesches, MD       Or  . ondansetron (ZOFRAN) injection 4 mg  4 mg Intravenous Q6H PRN Epifanio Lesches, MD      . polyethylene glycol (MIRALAX / GLYCOLAX) packet 17 g  17 g Oral Daily PRN Epifanio Lesches, MD      . sodium hypochlorite (DAKIN'S 1/4 STRENGTH) topical solution   Irrigation BID Bettey Costa, MD      . traZODone (DESYREL) tablet 25 mg  25 mg Oral QHS  PRN Epifanio Lesches, MD      . Derrill Memo ON 01/30/2019] vancomycin (VANCOCIN) IVPB 750 mg/150 ml premix  750 mg Intravenous Q36H Rowland Lathe, RPH      . vitamin C (ASCORBIC ACID) tablet 250 mg  250 mg Oral BID Epifanio Lesches, MD   250 mg at 01/29/19 1025     Discharge Medications: Please see discharge summary for a list of discharge medications.  Relevant Imaging Results:  Relevant Lab Results:   Additional Information [id number]  Su Hilt, RN

## 2019-01-29 NOTE — Progress Notes (Signed)
Ch visited pt to complete AD. Pt was resting at the time. Ch f/u with pt sister Coralyn Mark) who verified that she is the designated HPOA. Pt has documents on file and does not desire to make changes at this time. Pt sister is hoping to hear an update on the pt's status soon. Sister is concerned that pt is septic. Pt developed a bed sore as reported by sister. [place] provided words of encouragement to sister and ensured her that she was receiving the best care but understood her concerns with the development of the bed sore while the pt was in in the SNF. Sister was appreciative of call. No further needs at this time.    01/29/19 1100  Clinical Encounter Type  Visited With Patient;Family  Visit Type Other (Comment) (AD education )  Referral From Physician  Consult/Referral To Chaplain  Stress Factors  Patient Stress Factors Health changes;Major life changes  Family Stress Factors None identified

## 2019-01-29 NOTE — Progress Notes (Signed)
Updated patient's sister on plan of care. At this time sister has no questions. Yesenia Shepard

## 2019-01-29 NOTE — Progress Notes (Signed)
[place] [facility] [place] NAME: Yesenia Shepard    MR#:  [id number]  DATE OF BIRTH:  [birth year]  SUBJECTIVE:  [facility] sent [facility] due to infected sacral decubitus ulcer.  Patient reports that she has been bedbound.  Patient was lethargic yesterday however is more alert this morning.  REVIEW OF SYSTEMS:    Review of Systems  Constitutional: Negative for fever, chills weight loss HENT: Negative for ear pain, nosebleeds, congestion, facial swelling, rhinorrhea, neck pain, neck stiffness and ear discharge.   Respiratory: Negative for cough, shortness of breath, wheezing  Cardiovascular: Negative for chest pain, palpitations and leg swelling.  Gastrointestinal: Negative for heartburn, abdominal pain, vomiting, diarrhea or consitpation Genitourinary: Negative for dysuria, urgency, frequency, hematuria Musculoskeletal: Negative for back pain or joint pain Neurological: Negative for dizziness, seizures, syncope, focal weakness,  numbness and headaches.  Hematological: Does not bruise/bleed easily.  Psychiatric/Behavioral: Negative for hallucinations, confusion, dysphoric mood Skin: Sacral decubitus ulcer   Tolerating Diet: yes      DRUG ALLERGIES:  No Known Allergies  VITALS:  Blood pressure (!) 107/53, pulse 79, temperature 98.9 F (37.2 C), temperature source Oral, resp. rate 16, height 5\' 6"  (1.676 m), weight 55.1 kg, SpO2 97 %.  PHYSICAL EXAMINATION:  Constitutional: Appears thin and frail no distress. HENT: Normocephalic. Marland Kitchen Oropharynx is clear and moist.  Eyes: Conjunctivae and EOM are normal. PERRLA, no scleral icterus.  Neck: Normal ROM. Neck supple. No JVD. No tracheal deviation. CVS: RRR, S1/S2 +, no murmurs, no gallops, no carotid bruit.  Pulmonary: Effort and breath sounds normal, no stridor, rhonchi, wheezes, rales.  Abdominal: Soft. BS +,  no distension, tenderness, rebound or guarding.  Musculoskeletal: Normal range of  motion. No edema and no tenderness.  Neuro: Alert. CN 2-12 grossly intact. No focal deficits. Skin: Stage IV/unstageable decubitus ulcer please see picture from Dr. Ellender Hose note Bilateral heel wounds Psychiatric: Flat affect.      LABORATORY PANEL:   CBC Recent Labs  Lab 01/29/19 0354  WBC 15.1*  HGB 5.7*  HCT 19.4*  PLT 536*   ------------------------------------------------------------------------------------------------------------------  Chemistries  Recent Labs  Lab 01/28/19 1113 01/29/19 0354  NA 140 142  K 5.0 4.4  CL 109 115*  CO2 23 23  GLUCOSE 175* 88  BUN 29* 28*  CREATININE 1.64* 1.54*  CALCIUM 8.6* 7.8*  AST 16  --   ALT 15  --   ALKPHOS 109  --   BILITOT 0.6  --    ------------------------------------------------------------------------------------------------------------------  Cardiac Enzymes No results for input(s): TROPONINI in the last 168 hours. ------------------------------------------------------------------------------------------------------------------  RADIOLOGY:  Dg Chest Portable 1 View  Result Date: 01/28/2019 CLINICAL DATA:  Hypotension for the past few days. EXAM: PORTABLE CHEST 1 VIEW COMPARISON:  None. FINDINGS: Lungs clear. Skin folds over both the right and left chest noted. No pneumothorax or pleural fluid. Heart size is upper normal. Aortic atherosclerosis. No acute or focal bony abnormality. IMPRESSION: No acute disease. Atherosclerosis. Electronically Signed   By: Inge Rise M.D.   On: 01/28/2019 11:48     ASSESSMENT AND PLAN:    68 year old female with depression who goes from [facility] due to possible infected sacral decubitus ulcer.  1.  Sepsis: Patient presented with hypotension, leukocytosis.  Sepsis is due to infected sacral decubitus ulcer Continue cefepime and vancomycin Follow-up on surgical evaluation  2.  Sacral decubitus ulcer: Follow-up on surgical evaluation for need for debridement. Wound  care as per wound care  nurse  3.  Acute on chronic anemia: Anemia panel is consistent with iron deficiency.   Patient will receive 1 unit PRBC due to drop in hemoglobin.  Patient has consented.  Patient will also receive IV iron.   Folic acid also noted to be low which will be replaced  4.  Protein calorie malnutrition: Continue nutritional supplements  5.  Depression: Patient is on Lexapro and clonazepam as needed Management plans discussed with the patient and she is in agreement.  CODE STATUS: DNR  TOTAL TIME TAKING CARE OF THIS PATIENT: 30 minutes.     POSSIBLE D/C 1 to 2 days, DEPENDING ON CLINICAL CONDITION.   Bettey Costa M.D on 01/29/2019 at 11:11 AM  Between 7am to 6pm - Pager - [phone] After 6pm go to [email or web address] - password [email or web address] [place] [facility]  Office  [phone]  CC: Primary care physician; [name], [name], MD  Note: This dictation was prepared with Dragon dictation along with smaller phrase technology. Any transcriptional errors that result from this process are unintentional.

## 2019-01-29 NOTE — Progress Notes (Signed)
Family Meeting Note  Advance Directive:no  Today a meeting took place with the Patient.   The following clinical team members were present during this meeting:MD  The following were discussed:Patient's diagnosis sacral decubitus: , Patient's progosis: Unable to determine and Goals for treatment: DNR  Additional follow-up to be provided: CODE STATUS change in computer.  Chaplain consultation to create advanced directives.  Time spent during discussion:17 minutes  Bettey Costa, MD

## 2019-01-29 NOTE — TOC Initial Note (Signed)
Transition of Care [facility]) - Initial/Assessment Note    Patient Details  Name: Yesenia Shepard MRN: [id number] Date of Birth: [birth year]  Transition of [facility]) CM/SW Contact:    Su Hilt, RN Phone Number: 01/29/2019, 12:07 PM  Clinical Narrative:                 Damaris Schooner to [place], she stated that [facility], [facility] are doing [facility] with [facility] today. FL2 completed and sent thru the [facility] to [facility]        Patient Goals and CMS Choice        Expected Discharge Plan and Services                                                Prior Living Arrangements/Services                       Activities of Daily Living   ADL Screening (condition at time of admission) Patient's cognitive ability adequate to safely complete daily activities?: No Is the patient deaf or have difficulty hearing?: No Does the patient have difficulty seeing, even when wearing glasses/contacts?: No Does the patient have difficulty concentrating, remembering, or making decisions?: Yes Patient able to express need for assistance with ADLs?: Yes Does the patient have difficulty dressing or bathing?: Yes Independently performs ADLs?: No Communication: Independent Dressing (OT): Needs assistance Is this a change from baseline?: Pre-admission baseline Grooming: Needs assistance Is this a change from baseline?: Pre-admission baseline Feeding: Needs assistance Is this a change from baseline?: Pre-admission baseline Bathing: Needs assistance Is this a change from baseline?: Pre-admission baseline Toileting: Needs assistance Is this a change from baseline?: Pre-admission baseline In/Out Bed: Needs assistance Is this a change from baseline?: Pre-admission baseline Walks in Home: Needs assistance Is this a change from baseline?: Pre-admission baseline Does the patient have difficulty walking or climbing stairs?: Yes Weakness of Legs: Both Weakness of  Arms/Hands: Both  Permission Sought/Granted                  Emotional Assessment              Admission diagnosis:  Weakness [R53.1] Acute cystitis with hematuria [N30.01] AKI (acute kidney injury) ([place]) [N17.9] Pressure injury of sacral region, unstageable ([place]) [L89.150] Patient Active Problem List   Diagnosis Date Noted  . Decubitus ulcer, infected 01/28/2019  . Pressure injury of skin 12/24/2018  . Sepsis ([place]) 12/22/2018  . CKD (chronic kidney disease), stage IV ([place]) 12/22/2018  . UTI (urinary tract infection) 12/22/2018  . Elevated CK 12/22/2018  . Atherosclerotic peripheral vascular disease with ulceration ([place]) 10/24/2018  . Atherosclerosis of artery of extremity with ulceration ([place]) 10/24/2018  . Atherosclerosis of native arteries of extremity with rest pain ([place]) 10/01/2018  . Chronic midline low back pain with bilateral sciatica 04/23/2018  . Vitamin D deficiency 04/10/2018  . Swelling of limb 03/01/2018  . Atherosclerosis of native arteries of extremity with intermittent claudication ([place]) 01/03/2018  . Leg pain 12/12/2017  . S/P right rotator cuff repair 01/16/2017  . Preoperative examination 01/14/2017  . Right shoulder pain 09/06/2016  . Hepatic cirrhosis ([place]) 02/09/2016  . Chronic hepatitis C without hepatic coma ([place]) 11/09/2015  . HLD (hyperlipidemia) 08/20/2015  . Peripheral vascular disease ([place])  08/12/2015  . Essential hypertension 08/12/2015  . DM type 2 (diabetes mellitus, type 2) ([place]) 04/19/2015  . Current smoker 03/15/2015  . History of recreational drug use 03/15/2015  . Osteoarthritis 03/15/2015  . Preventative health care 03/15/2015  . Ulcer of left lower leg, limited to breakdown of skin ([place]) 03/15/2015  . Anxiety and depression 08/25/2012   PCP:  [name], [name], MD Pharmacy:   [facility] [address] [place] [address] [place] [address] Phone: [phone] Fax:  [phone]     Social Determinants of Health (SDOH) Interventions    Readmission Risk Interventions No flowsheet data found.

## 2019-01-29 NOTE — Progress Notes (Signed)
PT Cancellation Note  Patient Details Name: Yesenia Shepard MRN: [id number] DOB: [birth year]   Cancelled Treatment:    Reason Eval/Treat Not Completed: Patient not medically ready;Medical issues which prohibited therapy Patient has low hemaglobin (5.7) and currently getting transfusion; will re-attempt PT evaluation which patient is medically ready and appropriate;  PMH: patient presents to hospital from [place] with hypotension and green fluid from decubitus ulcer. She has also had recent UTI; Patient diagnosed with decubitus ulcer infection; PMH significant for hepatitis C, chronic pain syndrome, depression, PVD, history of drug abuse; She has multiple wounds including infection of sacral wound.   Trotter,MargaretPT, DPT 01/29/2019, 11:17 AM

## 2019-01-30 LAB — BPAM RBC
Blood Product Expiration Date: 202008102359
Blood Product Expiration Date: 202008132359
ISSUE DATE / TIME: 202007231007
ISSUE DATE / TIME: 202007231636
Unit Type and Rh: 6200
Unit Type and Rh: 6200

## 2019-01-30 LAB — TYPE AND SCREEN
ABO/RH(D): A POS
Antibody Screen: NEGATIVE
Unit division: 0
Unit division: 0

## 2019-01-30 LAB — GLUCOSE, CAPILLARY
Glucose-Capillary: 123 mg/dL — ABNORMAL HIGH (ref 70–99)
Glucose-Capillary: 127 mg/dL — ABNORMAL HIGH (ref 70–99)
Glucose-Capillary: 142 mg/dL — ABNORMAL HIGH (ref 70–99)

## 2019-01-30 LAB — BASIC METABOLIC PANEL
Anion gap: 4 — ABNORMAL LOW (ref 5–15)
BUN: 28 mg/dL — ABNORMAL HIGH (ref 8–23)
CO2: 21 mmol/L — ABNORMAL LOW (ref 22–32)
Calcium: 7.7 mg/dL — ABNORMAL LOW (ref 8.9–10.3)
Chloride: 113 mmol/L — ABNORMAL HIGH (ref 98–111)
Creatinine, Ser: 1.4 mg/dL — ABNORMAL HIGH (ref 0.44–1.00)
GFR calc Af Amer: 45 mL/min — ABNORMAL LOW (ref >60)
GFR calc non Af Amer: 39 mL/min — ABNORMAL LOW (ref >60)
Glucose, Bld: 169 mg/dL — ABNORMAL HIGH (ref 70–99)
Potassium: 4.1 mmol/L (ref 3.5–5.1)
Sodium: 138 mmol/L (ref 135–145)

## 2019-01-30 LAB — CBC
HCT: 26.2 % — ABNORMAL LOW (ref 36.0–46.0)
Hemoglobin: 8.3 g/dL — ABNORMAL LOW (ref 12.0–15.0)
MCH: 29.4 pg (ref 26.0–34.0)
MCHC: 31.7 g/dL (ref 30.0–36.0)
MCV: 92.9 fL (ref 80.0–100.0)
Platelets: 441 10*3/uL — ABNORMAL HIGH (ref 150–400)
RBC: 2.82 MIL/uL — ABNORMAL LOW (ref 3.87–5.11)
RDW: 15.9 % — ABNORMAL HIGH (ref 11.5–15.5)
WBC: 11.9 10*3/uL — ABNORMAL HIGH (ref 4.0–10.5)
nRBC: 0 % (ref 0.0–0.2)

## 2019-01-30 MED ORDER — SODIUM CHLORIDE 0.9 % IV SOLN
2.0000 g | Freq: Two times a day (BID) | INTRAVENOUS | Status: DC
  Administered 2019-01-30 – 2019-02-05 (×13): 2 g via INTRAVENOUS
  Filled 2019-01-30 (×15): qty 2

## 2019-01-30 NOTE — Consult Note (Signed)
Reason for Consult: Sacral pressure ulcer Referring Physician: Bettey Costa (hospitalist)  Yesenia Shepard is an 69 y.o. female.  HPI: She was admitted to the hospitalist service with sepsis.  She has a history of multiple urinary tract infections, as well as significant vasculopathy.  On admission, she was found to have a sacral pressure ulcer.  General surgery is consulted in this context to evaluate for need for debridement.  Since admission, the wound ostomy nurse has seen her and recommended a low air loss mattress, frequent turning, and 3 times a day dressing changes with Dakin solution.  The patient states that she rarely gets out of bed and is overall quite immobile.  She is not sure how long the pressure ulcer has been present, but states that it is painful.  Past Medical History:  Diagnosis Date  . Anxiety   . Arthritis   . Chronic pain   . Depression   . Diabetes mellitus without complication ([place])   . Drug abuse ([place])    History of polysubstance abuse; currently on methadone.  . Hepatitis    History of Hep "C". treated and cured with Harvoni  . History of chicken pox   . Hypertension   . Panic attacks   . Peripheral vascular disease ([place])    stent in place.   Marland Kitchen UTI (urinary tract infection)    History of    Past Surgical History:  Procedure Laterality Date  . ABDOMINAL HYSTERECTOMY  1989  . AMPUTATION TOE Left 10/26/2018   Procedure: 5TH RAY RESCTION;  Surgeon: Albertine Patricia, DPM;  Location: [facility];  Service: Podiatry;  Laterality: Left;  . CHOLECYSTECTOMY  1987  . INTRACAPSULAR CATARACT EXTRACTION Left   . LOWER EXTREMITY ANGIOGRAPHY Left 12/17/2017   Procedure: LOWER EXTREMITY ANGIOGRAPHY;  Surgeon: Katha Cabal, MD;  Location: [place] [facility];  Service: Cardiovascular;  Laterality: Left;  . LOWER EXTREMITY ANGIOGRAPHY Left 10/24/2018   Procedure: LOWER EXTREMITY ANGIOGRAPHY;  Surgeon: Algernon Huxley, MD;  Location: [place] [facility];  Service:  Cardiovascular;  Laterality: Left;  . PERIPHERAL VASCULAR CATHETERIZATION Left 05/04/2015   Procedure: Lower Extremity Angiography;  Surgeon: Katha Cabal, MD;  Location: [place] [facility];  Service: Cardiovascular;  Laterality: Left;  . PERIPHERAL VASCULAR CATHETERIZATION Left 05/04/2015   Procedure: Lower Extremity Intervention;  Surgeon: Katha Cabal, MD;  Location: [place] [facility];  Service: Cardiovascular;  Laterality: Left;  . SHOULDER ARTHROSCOPY WITH OPEN ROTATOR CUFF REPAIR Right 01/16/2017   Procedure: SHOULDER ARTHROSCOPY WITH OPEN ROTATOR CUFF REPAIR;  Surgeon: Thornton Park, MD;  Location: [facility];  Service: Orthopedics;  Laterality: Right;  . TONSILLECTOMY AND ADENOIDECTOMY  1971    Family History  Problem Relation Age of Onset  . Arthritis Mother   . Mental illness Mother        depression  . Diabetes Mother   . Cancer Mother        pancreatic  . Cancer Father        colon  . Heart disease Father   . Diabetes Father   . Cancer Daughter        lung    Social History:  reports that she quit smoking about 6 months ago. Her smoking use included cigarettes. She has a 15.00 pack-year smoking history. She has never used smokeless tobacco. She reports previous drug use. She reports that she does not drink alcohol.  Allergies: No Known Allergies  Medications: I have reviewed the patient's  current medications.  Results for orders placed or performed during the hospital encounter of 01/28/19 (from the past 48 hour(s))  Lactic acid, plasma     Status: None   Collection Time: 01/28/19  3:15 PM  Result Value Ref Range   Lactic Acid, Venous 0.8 0.5 - 1.9 mmol/L    Comment: Performed at [facility], [address] [place], [place] [address]  MRSA PCR Screening     Status: None   Collection Time: 01/29/19  3:26 AM   Specimen: Nasopharyngeal  Result Value Ref Range   MRSA by PCR NEGATIVE NEGATIVE    Comment:        The GeneXpert MRSA Assay  (FDA approved for NASAL specimens only), is one component of a comprehensive MRSA colonization surveillance program. It is not intended to diagnose MRSA infection nor to guide or monitor treatment for MRSA infections. Performed at [facility], [address] [place], [place] [address]   Basic metabolic panel     Status: Abnormal   Collection Time: 01/29/19  3:54 AM  Result Value Ref Range   Sodium 142 135 - 145 mmol/L   Potassium 4.4 3.5 - 5.1 mmol/L   Chloride 115 (H) 98 - 111 mmol/L   CO2 23 22 - 32 mmol/L   Glucose, Bld 88 70 - 99 mg/dL   BUN 28 (H) 8 - 23 mg/dL   Creatinine, Ser 1.54 (H) 0.44 - 1.00 mg/dL   Calcium 7.8 (L) 8.9 - 10.3 mg/dL   GFR calc non Af Amer 34 (L) >60 mL/min   GFR calc Af Amer 40 (L) >60 mL/min   Anion gap 4 (L) 5 - 15    Comment: Performed at [facility], [address] [place], [place] [address]  CBC     Status: Abnormal   Collection Time: 01/29/19  3:54 AM  Result Value Ref Range   WBC 15.1 (H) 4.0 - 10.5 K/uL   RBC 2.02 (L) 3.87 - 5.11 MIL/uL   Hemoglobin 5.7 (L) 12.0 - 15.0 g/dL   HCT 19.4 (L) 36.0 - 46.0 %   MCV 96.0 80.0 - 100.0 fL   MCH 28.2 26.0 - 34.0 pg   MCHC 29.4 (L) 30.0 - 36.0 g/dL   RDW 14.5 11.5 - 15.5 %   Platelets 536 (H) 150 - 400 K/uL   nRBC 0.0 0.0 - 0.2 %    Comment: Performed at [facility], [address] [place],  [address]  Vitamin B12     Status: None   Collection Time: 01/29/19  3:54 AM  Result Value Ref Range   Vitamin B-12 876 180 - 914 pg/mL    Comment: (NOTE) This assay is not validated for testing neonatal or myeloproliferative syndrome specimens for Vitamin B12 levels. Performed at [facility], [place] [address] [place], [place] [address]   Folate     Status: Abnormal   Collection Time: 01/29/19  3:54 AM  Result Value Ref Range   Folate 3.6 (L) >5.9 ng/mL    Comment: Performed at [facility], [place], [place] [address]  Iron and  TIBC     Status: Abnormal   Collection Time: 01/29/19  3:54 AM  Result Value Ref Range   Iron 15 (L) 28 - 170 ug/dL   TIBC 131 (L) 250 - 450 ug/dL   Saturation Ratios 11 10.4 - 31.8 %   UIBC 117 ug/dL    Comment: Performed at [facility], [address] [place], [place] [address]  Ferritin     Status: Abnormal   Collection Time: 01/29/19  3:54 AM  Result Value Ref Range   Ferritin 474 (H) 11 - 307 ng/mL    Comment: Performed at [facility], [address] [place], [place] [address]  Reticulocytes     Status: Abnormal   Collection Time: 01/29/19  3:54 AM  Result Value Ref Range   Retic Ct Pct 3.9 (H) 0.4 - 3.1 %   RBC. 2.02 (L) 3.87 - 5.11 MIL/uL   Retic Count, Absolute 78.8 19.0 - 186.0 K/uL   Immature Retic Fract 23.6 (H) 2.3 - 15.9 %    Comment: Performed at [facility], [address] [place], [place] [address]  Prepare RBC     Status: None   Collection Time: 01/29/19  8:05 AM  Result Value Ref Range   Order Confirmation      ORDER PROCESSED BY BLOOD BANK Performed at [name], [address] [place], [place] [address]   Glucose, capillary     Status: Abnormal   Collection Time: 01/29/19  8:35 AM  Result Value Ref Range   Glucose-Capillary 111 (H) 70 - 99 mg/dL  Hemoglobin and hematocrit, blood     Status: Abnormal   Collection Time: 01/29/19  3:18 PM  Result Value Ref Range   Hemoglobin 6.9 (L) 12.0 - 15.0 g/dL   HCT 22.5 (L) 36.0 - 46.0 %    Comment: Performed at [facility], [address] [place], [place] [address]  Prepare RBC     Status: None   Collection Time: 01/29/19  4:09 PM  Result Value Ref Range   Order Confirmation      ORDER PROCESSED BY BLOOD BANK Performed at [facility], [address] [place], [place] [address]   Hemoglobin     Status: Abnormal   Collection Time: 01/29/19  9:12 PM  Result Value Ref Range   Hemoglobin 8.3 (L) 12.0 - 15.0 g/dL    Comment: Performed at  [facility], [address] [place], [place] [address]  CBC     Status: Abnormal   Collection Time: 01/30/19  3:01 AM  Result Value Ref Range   WBC 11.9 (H) 4.0 - 10.5 K/uL   RBC 2.82 (L) 3.87 - 5.11 MIL/uL   Hemoglobin 8.3 (L) 12.0 - 15.0 g/dL   HCT 26.2 (L) 36.0 - 46.0 %   MCV 92.9 80.0 - 100.0 fL   MCH 29.4 26.0 - 34.0 pg   MCHC 31.7 30.0 - 36.0 g/dL   RDW 15.9 (H) 11.5 - 15.5 %   Platelets 441 (H) 150 - 400 K/uL   nRBC 0.0 0.0 - 0.2 %    Comment: Performed at [facility], [address] [place], [place] [address]  Basic metabolic panel     Status: Abnormal   Collection Time: 01/30/19  3:01 AM  Result Value Ref Range   Sodium 138 135 - 145 mmol/L   Potassium 4.1 3.5 - 5.1 mmol/L   Chloride 113 (H) 98 - 111 mmol/L   CO2 21 (L) 22 - 32 mmol/L   Glucose, Bld 169 (H) 70 - 99 mg/dL   BUN 28 (H) 8 - 23 mg/dL   Creatinine, Ser 1.40 (H) 0.44 - 1.00 mg/dL   Calcium 7.7 (L) 8.9 - 10.3 mg/dL   GFR calc non Af Amer 39 (L) >60 mL/min   GFR calc  Af Amer 45 (L) >60 mL/min   Anion gap 4 (L) 5 - 15    Comment: Performed at [facility], [address] [place], [place] [address]  Glucose, capillary     Status: Abnormal   Collection Time: 01/30/19  7:35 AM  Result Value Ref Range   Glucose-Capillary 123 (H) 70 - 99 mg/dL  Glucose, capillary     Status: Abnormal   Collection Time: 01/30/19 11:20 AM  Result Value Ref Range   Glucose-Capillary 127 (H) 70 - 99 mg/dL   Comment 1 Notify RN    Comment 2 Document in Chart     No results found.  Review of Systems  Constitutional: Positive for malaise/fatigue.  All other systems reviewed and are negative.  Blood pressure 129/67, pulse 78, temperature 99.3 F (37.4 C), temperature source Oral, resp. rate 16, height 5\' 6"  (1.676 m), weight 55.1 kg, SpO2 96 %. Physical Exam  Constitutional: No distress.  Thin, frail, appears older than stated age.  HENT:  Head: Normocephalic and atraumatic.  Poor dentition   Eyes: Right eye exhibits no discharge. Left eye exhibits no discharge. No scleral icterus.  Neck: No tracheal deviation present.  Cardiovascular: Normal rate and regular rhythm.  Respiratory: Effort normal. No stridor.  GI: Soft.  Genitourinary:    Genitourinary Comments: Deferred   Musculoskeletal:     Comments: Bilateral lower extremities are wrapped in padded boots.  Neurological: She is alert.  Answers questions appropriately  Skin:  Upper extremities are covered with areas that appear consistent with skin picking  There is a large sacral ulcer present.  There is actually good pink granulation tissue at the base.  No pus or foul odor appreciated.  There is a small rim of necrotic skin near the cranial aspect.  There is some fibrinous exudate present.  Bone is palpable.  Psychiatric: Her behavior is normal.    Assessment/Plan: This is a 68 year old woman who was admitted with presumed sepsis.  Among the potential sources is a sacral decubitus ulcer.  Since her admission, the wound seems to have been cleaned up with local care.  There is no evidence of necrotizing soft tissue infection.  No urgent need for debridement at this time, however if a bone biopsy is desired to evaluate for osteomyelitis and to obtain cultures to guide antibiotic treatment, this could certainly be considered during this hospitalization.  No surgery will continue to follow.  Fredirick Maudlin 01/30/2019, 3:00 PM

## 2019-01-30 NOTE — Evaluation (Signed)
Physical Therapy Evaluation Patient Details Name: MINELA BRIDGEWATER MRN: [id number] DOB: 24-Dec-1950 Today's Date: 01/30/2019   History of Present Illness  presented to ER from STR secondary to hypotension, weakness; admitted for management of UTI, infected sacral decubitus.  Clinical Impression  Upon evaluation, patient alert and oriented to basic information; follows simple commands, but requires increased encouragement for even minimal participation due to pain.  Globally weak and deconditioned throughout all extremities; significant PF contractures noted in bilat ankles.  Consistent reports of pain, primarily in buttocks; relieved only with repositioning to R sidelying.  Currently requiring mod/max assist for bed mobility and repositioning; unable to tolerate attempts at sitting EOB.  Will continue to assess/progress as appropriate; anticipate very slow, guarded progression. Would benefit from skilled PT to address above deficits and promote optimal return to PLOF; recommend transition to STR upon discharge from acute hospitalization, pending ability to actively and consistently participate/progress.May benefit from consideration of LTC for longer-term care needs s/p rehab.     Follow Up Recommendations SNF    Equipment Recommendations       Recommendations for Other Services       Precautions / Restrictions Precautions Precautions: Fall Precaution Comments: sacral decub, L heel decub Restrictions Weight Bearing Restrictions: No      Mobility  Bed Mobility Overal bed mobility: Needs Assistance Bed Mobility: Rolling Rolling: Mod assist         General bed mobility comments: assist for full rotation and unweighting of buttocks surface  Transfers                 General transfer comment: unsafe/unable to tolerate due to pain  Ambulation/Gait             General Gait Details: unsafe/unable to tolerate due to pain  Stairs            Wheelchair Mobility     Modified Rankin (Stroke Patients Only)       Balance Overall balance assessment: Needs assistance                                           Pertinent Vitals/Pain Pain Assessment: Faces Pain Score: 9  Pain Location: buttocks Pain Descriptors / Indicators: Aching;Grimacing;Moaning Pain Intervention(s): Limited activity within patient's tolerance;Monitored during session;Premedicated before session;Repositioned;Patient requesting pain meds-RN notified    Home Living Family/patient expects to be discharged to:: Skilled nursing facility                      Prior Function Level of [place]: Needs assistance         Comments: At baseline, ambulatory with RW; reports non-ambulatory for "several months" since LE wound issues     Hand Dominance   Dominant Hand: Right    Extremity/Trunk Assessment   Upper Extremity Assessment Upper Extremity Assessment: Generalized weakness    Lower Extremity Assessment Lower Extremity Assessment: Generalized weakness(grossly 3-/5 throughout bilat LEs; significant ankle PF contracture bilat)       Communication   Communication: No difficulties  Cognition Arousal/Alertness: Awake/alert Behavior During Therapy: WFL for tasks assessed/performed Overall Cognitive Status: No family/caregiver present to determine baseline cognitive functioning                                 General Comments: oriented to self only; follows  simple commands, but highly focused/perseverative on pain      General Comments      Exercises Other Exercises Other Exercises: Supine LE therex, 1x5-8, act assist ROM: ankle pumps, SAQs, heel slides, hip abduct/adduct.  Inconsistent active effort, significant guarding due to pain. Other Exercises: Repositioned to R sidelying for pressure relief and pain control.  Appeared to relax somewhat, with improved pain control, once off buttocks.   Assessment/Plan    PT  Assessment Patient needs continued PT services  PT Problem List Decreased strength;Decreased range of motion;Decreased activity tolerance;Decreased balance;Decreased mobility;Decreased knowledge of use of DME;Decreased safety awareness;Decreased knowledge of precautions;Decreased skin integrity;Pain       PT Treatment Interventions DME instruction;Gait training;Functional mobility training;Therapeutic activities;Therapeutic exercise;Balance training;Patient/family education    PT Goals (Current goals can be found in the Care Plan section)  Acute Rehab PT Goals Patient Stated Goal: pain control PT Goal Formulation: With patient Time For Goal Achievement: 02/13/19 Potential to Achieve Goals: Fair    Frequency Min 2X/week   Barriers to discharge Decreased caregiver support      Co-evaluation               AM-PAC PT "6 Clicks" Mobility  Outcome Measure Help needed turning from your back to your side while in a flat bed without using bedrails?: A Lot Help needed moving from lying on your back to sitting on the side of a flat bed without using bedrails?: Total Help needed moving to and from a bed to a chair (including a wheelchair)?: Total Help needed standing up from a chair using your arms (e.g., wheelchair or bedside chair)?: Total Help needed to walk in hospital room?: Total Help needed climbing 3-5 steps with a railing? : Total 6 Click Score: 7    End of Session Equipment Utilized During Treatment: Gait belt;Oxygen Activity Tolerance: Patient limited by pain Patient left: in bed;with call bell/phone within reach;with bed alarm set Nurse Communication: Mobility status PT Visit Diagnosis: Muscle weakness (generalized) (M62.81);Difficulty in walking, not elsewhere classified (R26.2);Pain    Time: 1025-1045 PT Time Calculation (min) (ACUTE ONLY): 20 min   Charges:   PT Evaluation $PT Eval Moderate Complexity: 1 Mod PT Treatments $Therapeutic Exercise: 8-22 mins         Korah Hufstedler H. Owens Shark, PT, DPT, NCS 01/30/19, 11:15 AM [phone]

## 2019-01-30 NOTE — Consult Note (Signed)
Pharmacy Antibiotic Note  Yesenia Shepard is a 68 y.o. female admitted on 01/28/2019 with wound infection.  Pharmacy has been consulted for Cefepime dosing.  Vancomycin d/c 7/24    Plan: Day 3- Renal fxn improved Crcl 33.5 ml/min- will increase to Cefepime 2g q12h.     Height: 5\' 6"  (167.6 cm) Weight: 121 lb 7.6 oz (55.1 kg) IBW/kg (Calculated) : 59.3  Temp (24hrs), Avg:98.8 F (37.1 C), Min:98.1 F (36.7 C), Max:99.3 F (37.4 C)  Recent Labs  Lab 01/28/19 1113 01/28/19 1114 01/28/19 1515 01/29/19 0354 01/30/19 0301  WBC 17.6*  --   --  15.1* 11.9*  CREATININE 1.64*  --   --  1.54* 1.40*  LATICACIDVEN  --  0.9 0.8  --   --     Estimated Creatinine Clearance: 33.5 mL/min (A) (by C-G formula based on SCr of 1.4 mg/dL (H)).    No Known Allergies  Antimicrobials this admission: 7/22 Cefepime >> 7/22 Vancomycin >> 7/24  Dose adjustments this admission: N/A  Microbiology results: 7/22 BCx: NG 7/22 UCx: multiple species 7/23 MRSA PCR neg   Thank you for allowing pharmacy to be a part of this patient's care.  Yesenia Shepard A 01/30/2019 8:45 AM

## 2019-01-30 NOTE — Progress Notes (Addendum)
[facility] [place] NAME: Yesenia Shepard    MR#:  [id number]  DATE OF BIRTH:  06/13/1951  SUBJECTIVE:  Patient with foul smelling sacral decubitus ulcers.  Nurse in the room this morning. Patient denies chest pain or dizziness.  REVIEW OF SYSTEMS:    Review of Systems  Constitutional: Negative for fever, chills weight loss HENT: Negative for ear pain, nosebleeds, congestion, facial swelling, rhinorrhea, neck pain, neck stiffness and ear discharge.   Respiratory: Negative for cough, shortness of breath, wheezing  Cardiovascular: Negative for chest pain, palpitations and leg swelling.  Gastrointestinal: Negative for heartburn, abdominal pain, vomiting, diarrhea or consitpation Genitourinary: Negative for dysuria, urgency, frequency, hematuria Musculoskeletal: Negative for back pain or joint pain Neurological: Negative for dizziness, seizures, syncope, focal weakness,  numbness and headaches.  Hematological: Does not bruise/bleed easily.  Psychiatric/Behavioral: Negative for hallucinations, confusion, dysphoric mood Skin: Sacral decubitus ulcer   Tolerating Diet: yes      DRUG ALLERGIES:  No Known Allergies  VITALS:  Blood pressure 129/67, pulse 78, temperature 99.3 F (37.4 C), temperature source Oral, resp. rate 16, height 5\' 6"  (1.676 m), weight 55.1 kg, SpO2 96 %.  PHYSICAL EXAMINATION:  Constitutional: Appears thin and frail no distress. HENT: Normocephalic. Marland Kitchen Oropharynx is clear and moist.  Eyes: Conjunctivae and EOM are normal. PERRLA, no scleral icterus.  Neck: Normal ROM. Neck supple. No JVD. No tracheal deviation. CVS: RRR, S1/S2 +, no murmurs, no gallops, no carotid bruit.  Pulmonary: Effort and breath sounds normal, no stridor, rhonchi, wheezes, rales.  Abdominal: Soft. BS +,  no distension, tenderness, rebound or guarding.  Musculoskeletal: Normal range of motion. No edema and no tenderness.  Neuro: Alert. CN 2-12  grossly intact. No focal deficits. Skin: Stage IV/unstageable decubitus ulcer please see picture from Dr. Ellender Hose note Bilateral heel wounds Psychiatric: Flat affect.      LABORATORY PANEL:   CBC Recent Labs  Lab 01/30/19 0301  WBC 11.9*  HGB 8.3*  HCT 26.2*  PLT 441*   ------------------------------------------------------------------------------------------------------------------  Chemistries  Recent Labs  Lab 01/28/19 1113  01/30/19 0301  NA 140   < > 138  K 5.0   < > 4.1  CL 109   < > 113*  CO2 23   < > 21*  GLUCOSE 175*   < > 169*  BUN 29*   < > 28*  CREATININE 1.64*   < > 1.40*  CALCIUM 8.6*   < > 7.7*  AST 16  --   --   ALT 15  --   --   ALKPHOS 109  --   --   BILITOT 0.6  --   --    < > = values in this interval not displayed.   ------------------------------------------------------------------------------------------------------------------  Cardiac Enzymes No results for input(s): TROPONINI in the last 168 hours. ------------------------------------------------------------------------------------------------------------------  RADIOLOGY:  No results found.   ASSESSMENT AND PLAN:    68 year old female with depression who goes from [facility] due to possible infected sacral decubitus ulcer.  1.  Sepsis: Patient presented with hypotension, leukocytosis.  Sepsis is due to infected sacral decubitus ulcer Continue cefepime and vancomycin Follow-up on surgical evaluation Surgical consultation placed via epic to Dr. Adora Fridge  2.  Sacral decubitus ulcer: Follow-up on surgical evaluation for need for debridement. Wound care as per wound care nurse  3.  Acute on chronic anemia: Anemia panel is consistent with iron deficiency.   Patient is status post 1 unit  PRBC due to drop in hemoglobin.  She also received IV iron.   Folic acid also noted to be low which will be replaced Hemoglobin this morning is stable   4.  Protein calorie malnutrition:  Continue nutritional supplements  5.  Depression: Patient is on Lexapro and clonazepam as needed  6.  Chronic pain syndrome on chronic methadone Management plans discussed with the patient and she is in agreement.  CODE STATUS: DNR  TOTAL TIME TAKING CARE OF THIS PATIENT: 25 minutes.   Physical therapy is recommending skilled nursing facility upon discharge.  Patient is from [facility].  POSSIBLE D/C 1 to 2 days, DEPENDING ON CLINICAL CONDITION.   Bettey Costa M.D on 01/30/2019 at 11:39 AM  Between 7am to 6pm - Pager - [phone] After 6pm go to [email or web address] - password [email or web address] [facility]  Office  [phone]  CC: Primary care physician; [name], [name], MD  Note: This dictation was prepared with Dragon dictation along with smaller phrase technology. Any transcriptional errors that result from this process are unintentional.

## 2019-01-30 NOTE — Care Management Important Message (Signed)
Important Message  Patient Details  Name: Yesenia Shepard MRN: [id number] Date of Birth: [birth year]   Medicare Important Message Given:  Yes     Juliann Pulse A Kiran Lapine 01/30/2019, 12:00 PM

## 2019-01-31 LAB — CBC
HCT: 27.4 % — ABNORMAL LOW (ref 36.0–46.0)
Hemoglobin: 8.5 g/dL — ABNORMAL LOW (ref 12.0–15.0)
MCH: 29.3 pg (ref 26.0–34.0)
MCHC: 31 g/dL (ref 30.0–36.0)
MCV: 94.5 fL (ref 80.0–100.0)
Platelets: 413 10*3/uL — ABNORMAL HIGH (ref 150–400)
RBC: 2.9 MIL/uL — ABNORMAL LOW (ref 3.87–5.11)
RDW: 15.6 % — ABNORMAL HIGH (ref 11.5–15.5)
WBC: 11.8 10*3/uL — ABNORMAL HIGH (ref 4.0–10.5)
nRBC: 0 % (ref 0.0–0.2)

## 2019-01-31 LAB — GLUCOSE, CAPILLARY: Glucose-Capillary: 77 mg/dL (ref 70–99)

## 2019-01-31 MED ORDER — SODIUM CHLORIDE 0.9 % IV SOLN
INTRAVENOUS | Status: DC
  Administered 2019-02-01 – 2019-02-03 (×3): via INTRAVENOUS

## 2019-01-31 NOTE — Progress Notes (Addendum)
[facility] at [place] NAME: Yesenia Shepard    MR#:  [id number]  DATE OF BIRTH:  1951-06-25  SUBJECTIVE:  Patient complains sacral pain this morning. REVIEW OF SYSTEMS:    Review of Systems  Constitutional: Negative for fever, chills weight loss HENT: Negative for ear pain, nosebleeds, congestion, facial swelling, rhinorrhea, neck pain, neck stiffness and ear discharge.   Respiratory: Negative for cough, shortness of breath, wheezing  Cardiovascular: Negative for chest pain, palpitations and leg swelling.  Gastrointestinal: Negative for heartburn, abdominal pain, vomiting, diarrhea or consitpation Genitourinary: Negative for dysuria, urgency, frequency, hematuria Musculoskeletal: Negative for back pain or joint pain Neurological: Negative for dizziness, seizures, syncope, focal weakness,  numbness and headaches.  Hematological: Does not bruise/bleed easily.  Psychiatric/Behavioral: Negative for hallucinations, confusion, dysphoric mood Skin: Sacral decubitus ulcer DRUG ALLERGIES:  No Known Allergies  VITALS:  Blood pressure (!) 143/68, pulse 72, temperature 97.7 F (36.5 C), resp. rate 18, height 5\' 6"  (1.676 m), weight 55.1 kg, SpO2 100 %.  PHYSICAL EXAMINATION:  Constitutional: Appears thin and frail no distress. HENT: Normocephalic.  Eyes: Conjunctivae and EOM are normal. PERRLA, no scleral icterus.  Neck: Normal ROM. Neck supple. No JVD. No tracheal deviation. CVS: RRR, S1/S2 +, no murmurs, no gallops, no carotid bruit.  Pulmonary: Effort and breath sounds normal, no stridor, rhonchi, wheezes, rales.  Abdominal: Soft. BS +,  no distension, tenderness, rebound or guarding.  Musculoskeletal: Normal range of motion. No edema and no tenderness.  Neuro: Alert. CN 2-12 grossly intact. No focal deficits. Skin: Stage IV/unstageable decubitus ulcer in dressing. Psychiatric: Flat affect.  LABORATORY PANEL:   CBC Recent Labs  Lab  01/31/19 0649  WBC 11.8*  HGB 8.5*  HCT 27.4*  PLT 413*   ------------------------------------------------------------------------------------------------------------------  Chemistries  Recent Labs  Lab 01/28/19 1113  01/30/19 0301  NA 140   < > 138  K 5.0   < > 4.1  CL 109   < > 113*  CO2 23   < > 21*  GLUCOSE 175*   < > 169*  BUN 29*   < > 28*  CREATININE 1.64*   < > 1.40*  CALCIUM 8.6*   < > 7.7*  AST 16  --   --   ALT 15  --   --   ALKPHOS 109  --   --   BILITOT 0.6  --   --    < > = values in this interval not displayed.   ------------------------------------------------------------------------------------------------------------------  Cardiac Enzymes No results for input(s): TROPONINI in the last 168 hours. ------------------------------------------------------------------------------------------------------------------  RADIOLOGY:  No results found.   ASSESSMENT AND PLAN:    68 year old female with depression who goes [facility] due to possible infected sacral decubitus ulcer.  1.  Sepsis: Patient presented with hypotension, leukocytosis.  Sepsis is due to infected sacral decubitus ulcer Continue cefepime and vancomycin Per Dr. Lysle Pearl, the wound is significantly worse than reported and the need debridement, but the patient refused debridement, continue antibiotics and wound care for now. Hold Plavix for debridement.  2.  Sacral decubitus ulcer: As above.  3.  Acute on chronic anemia: Anemia panel is consistent with iron deficiency.   Patient is status post 1 unit PRBC due to drop in hemoglobin.  She also received IV iron.   Folic acid also noted to be low which will be replaced Hemoglobin is stable  4.  Protein calorie malnutrition: Continue nutritional supplements  5.  Depression: Patient is on Lexapro and clonazepam as needed  6.  Chronic pain syndrome on chronic methadone CKD stage III.  Stable.  I discussed with Dr. Lysle Pearl. Discussed  with her sister. Management plans discussed with the patient and she is in agreement.  CODE STATUS: DNR  TOTAL TIME TAKING CARE OF THIS PATIENT: 28 minutes.   Physical therapy is recommending skilled nursing facility upon discharge.  [facility] is [facility].  POSSIBLE D/C 3 days, DEPENDING ON CLINICAL CONDITION.   [name] M.D on 01/31/2019 at 12:58 PM  Between 7am to 6pm - Pager - [phone] After 6pm go to [email or web address] - password [email or web address] [facility]  Office  [phone]  CC: Primary care physician; [name], [name], MD  Note: This dictation was prepared with Dragon dictation along with smaller phrase technology. Any transcriptional errors that result from this process are unintentional.

## 2019-01-31 NOTE — Progress Notes (Addendum)
Subjective:  CC: Yesenia Shepard is a 68 y.o. female  Hospital stay day 3,   Sacral decubitus ulcer  HPI: No significant changes overnight.  Patient still complaining of pain in the sacral area.  ROS:  General: Denies weight loss, weight gain, fatigue, fevers, chills, and night sweats. Heart: Denies chest pain, palpitations, racing heart, irregular heartbeat, leg pain or swelling, and decreased activity tolerance. Respiratory: Denies breathing difficulty, shortness of breath, wheezing, cough, and sputum. GI: Denies change in appetite, heartburn, nausea, vomiting, constipation, diarrhea, and blood in stool. GU: Denies difficulty urinating, pain with urinating, urgency, frequency, blood in urine.   Objective:   Temp:  [97.7 F (36.5 C)-98.4 F (36.9 C)] 97.7 F (36.5 C) (07/25 0754) Pulse Rate:  [66-72] 72 (07/25 0754) Resp:  [18] 18 (07/25 0754) BP: (120-143)/(66-70) 143/68 (07/25 0754) SpO2:  [94 %-100 %] 100 % (07/25 0754)     Height: 5\' 6"  (167.6 cm) Weight: 55.1 kg BMI (Calculated): 19.62   Intake/Output this shift:   Intake/Output Summary (Last 24 hours) at 01/31/2019 1301 Last data filed at 01/31/2019 1042 Gross per 24 hour  Intake 1649.77 ml  Output 1950 ml  Net -300.23 ml    Constitutional :  alert, cooperative and appears stated age  Respiratory:  clear to auscultation bilaterally  Cardiovascular:  regular rate and rhythm  Gastrointestinal: soft, non-tender; bowel sounds normal; no masses,  no organomegaly.   Skin: Cool and moist.  Large sacral decubitus ulcer with some granulation tissue noted towards the caudad aspect.  Moderate amount of foul-smelling necrotic tissue at the cephalad aspect.  Full exam to stage not obtainable secondary to the necrotic tissue.  Psychiatric: Normal affect, non-agitated, not confused       LABS:  CMP Latest Ref Rng & Units 01/30/2019 01/29/2019 01/28/2019  Glucose 70 - 99 mg/dL 169(H) 88 175(H)  BUN 8 - 23 mg/dL 28(H) 28(H) 29(H)   Creatinine 0.44 - 1.00 mg/dL 1.40(H) 1.54(H) 1.64(H)  Sodium 135 - 145 mmol/L 138 142 140  Potassium 3.5 - 5.1 mmol/L 4.1 4.4 5.0  Chloride 98 - 111 mmol/L 113(H) 115(H) 109  CO2 22 - 32 mmol/L 21(L) 23 23  Calcium 8.9 - 10.3 mg/dL 7.7(L) 7.8(L) 8.6(L)  Total Protein 6.5 - 8.1 g/dL - - 6.6  Total Bilirubin 0.3 - 1.2 mg/dL - - 0.6  Alkaline Phos 38 - 126 U/L - - 109  AST 15 - 41 U/L - - 16  ALT 0 - 44 U/L - - 15   CBC Latest Ref Rng & Units 01/31/2019 01/30/2019 01/29/2019  WBC 4.0 - 10.5 K/uL 11.8(H) 11.9(H) -  Hemoglobin 12.0 - 15.0 g/dL 8.5(L) 8.3(L) 8.3(L)  Hematocrit 36.0 - 46.0 % 27.4(L) 26.2(L) -  Platelets 150 - 400 K/uL 413(H) 441(H) -    RADS: n/a Assessment:   Sacral decubitus ulcer.  The extent of the necrotic tissue at the caudad portion is worse now than initially reported yesterday.  This is despite maximal local wound care and off pressure bed.  I explained to the patient that she will need formal debridement in the operating room to maximize wound healing afterwards.  Risks of surgery include but not limited to bleeding recurrent infection, long-term wound care, delayed wound care.  Risks of general anesthesia include but not limited to, DVT, PE, or even death.  Benefits of proceeding include possible symptom relief and prevention of further worsening of infection leading to possible sepsis.  Alternative includes continuing local wound care but  this seems to not be enough at this point.  After explaining of the above.  Patient was still hesitant on agreeing to surgery.  I asked her what her specific concerns was but she just stated she is generally anxious about it.  I explained to her that due to her history of Plavix and history of anemia, we probably should wait another day or 2 regardless to minimize any perioperative complications.  She verbalized understanding of this and said that she would like to think about whether or not to pursue surgery at this point.  Plan  above was discussed with the admitting physician and he is in agreement as well.  Recommend continuing medical care per primary for a protein calorie malnutrition, depression, chronic pain syndrome and local wound care as needed.  Patient white count remained stable, afebrile, and overall clinical picture does not indicate any worsening sepsis at this time.  UPDATE: Spoke with patient POA, and she stated that patientt is now agreeable to surgery.  Told her that I will discuss case with Dr. Peyton Najjar, who is covering the service for tomorrow, and explained to her that surgery MAY happen tomorrow, if there is significant change in her status,  but likely will be Monday.  She verbalized understanding.  Will keep her NPO overnight just in case

## 2019-01-31 NOTE — Progress Notes (Signed)
Patient resting in bed. Dressings changed. No complaints at this time. Continue to monitor.

## 2019-02-01 LAB — CBC WITH DIFFERENTIAL/PLATELET
Abs Immature Granulocytes: 0.1 10*3/uL — ABNORMAL HIGH (ref 0.00–0.07)
Basophils Absolute: 0 10*3/uL (ref 0.0–0.1)
Basophils Relative: 0 %
Eosinophils Absolute: 0.2 10*3/uL (ref 0.0–0.5)
Eosinophils Relative: 1 %
HCT: 29.6 % — ABNORMAL LOW (ref 36.0–46.0)
Hemoglobin: 9 g/dL — ABNORMAL LOW (ref 12.0–15.0)
Immature Granulocytes: 1 %
Lymphocytes Relative: 10 %
Lymphs Abs: 1.4 10*3/uL (ref 0.7–4.0)
MCH: 28.8 pg (ref 26.0–34.0)
MCHC: 30.4 g/dL (ref 30.0–36.0)
MCV: 94.9 fL (ref 80.0–100.0)
Monocytes Absolute: 1 10*3/uL (ref 0.1–1.0)
Monocytes Relative: 8 %
Neutro Abs: 10.9 10*3/uL — ABNORMAL HIGH (ref 1.7–7.7)
Neutrophils Relative %: 80 %
Platelets: 513 10*3/uL — ABNORMAL HIGH (ref 150–400)
RBC: 3.12 MIL/uL — ABNORMAL LOW (ref 3.87–5.11)
RDW: 15.3 % (ref 11.5–15.5)
WBC: 13.5 10*3/uL — ABNORMAL HIGH (ref 4.0–10.5)
nRBC: 0 % (ref 0.0–0.2)

## 2019-02-01 LAB — BASIC METABOLIC PANEL
Anion gap: 6 (ref 5–15)
BUN: 25 mg/dL — ABNORMAL HIGH (ref 8–23)
CO2: 20 mmol/L — ABNORMAL LOW (ref 22–32)
Calcium: 8.3 mg/dL — ABNORMAL LOW (ref 8.9–10.3)
Chloride: 115 mmol/L — ABNORMAL HIGH (ref 98–111)
Creatinine, Ser: 1.13 mg/dL — ABNORMAL HIGH (ref 0.44–1.00)
GFR calc Af Amer: 58 mL/min — ABNORMAL LOW (ref >60)
GFR calc non Af Amer: 50 mL/min — ABNORMAL LOW (ref >60)
Glucose, Bld: 99 mg/dL (ref 70–99)
Potassium: 4.1 mmol/L (ref 3.5–5.1)
Sodium: 141 mmol/L (ref 135–145)

## 2019-02-01 LAB — MAGNESIUM: Magnesium: 1.9 mg/dL (ref 1.7–2.4)

## 2019-02-01 LAB — GLUCOSE, CAPILLARY: Glucose-Capillary: 98 mg/dL (ref 70–99)

## 2019-02-01 LAB — PHOSPHORUS: Phosphorus: 2.3 mg/dL — ABNORMAL LOW (ref 2.5–4.6)

## 2019-02-01 MED ORDER — POTASSIUM PHOSPHATES 15 MMOLE/5ML IV SOLN
20.0000 mmol | Freq: Once | INTRAVENOUS | Status: AC
  Administered 2019-02-01: 20 mmol via INTRAVENOUS
  Filled 2019-02-01: qty 6.67

## 2019-02-01 NOTE — Progress Notes (Addendum)
[facility] at [place] NAME: Yesenia Shepard    MR#:  [id number]  DATE OF BIRTH:  June 20, 1951  SUBJECTIVE:  Patient complains sacral pain this morning. REVIEW OF SYSTEMS:    Review of Systems  Constitutional: Negative for fever, chills weight loss HENT: Negative for ear pain, nosebleeds, congestion, facial swelling, rhinorrhea, neck pain, neck stiffness and ear discharge.   Respiratory: Negative for cough, shortness of breath, wheezing  Cardiovascular: Negative for chest pain, palpitations and leg swelling.  Gastrointestinal: Negative for heartburn, abdominal pain, vomiting, diarrhea or consitpation Genitourinary: Negative for dysuria, urgency, frequency, hematuria Musculoskeletal: Negative for back pain or joint pain Neurological: Negative for dizziness, seizures, syncope, focal weakness,  numbness and headaches.  Hematological: Does not bruise/bleed easily.  Psychiatric/Behavioral: Negative for hallucinations, confusion, dysphoric mood Skin: Sacral decubitus ulcer DRUG ALLERGIES:  No Known Allergies  VITALS:  Blood pressure (!) 149/94, pulse 84, temperature 97.9 F (36.6 C), resp. rate 16, height 5\' 6"  (1.676 m), weight 54.2 kg, SpO2 100 %.  PHYSICAL EXAMINATION:  Constitutional: Appears thin and frail no distress. HENT: Normocephalic.  Eyes: Conjunctivae and EOM are normal. PERRLA, no scleral icterus.  Neck: Normal ROM. Neck supple. No JVD. No tracheal deviation. CVS: RRR, S1/S2 +, no murmurs, no gallops, no carotid bruit.  Pulmonary: Effort and breath sounds normal, no stridor, rhonchi, wheezes, rales.  Abdominal: Soft. BS +,  no distension, tenderness, rebound or guarding.  Musculoskeletal: Normal range of motion. No edema and no tenderness.  Neuro: Alert. CN 2-12 grossly intact. No focal deficits. Skin: Stage IV/unstageable decubitus ulcer in dressing. Psychiatric: Flat affect.  LABORATORY PANEL:   CBC Recent Labs  Lab  02/01/19 0336  WBC 13.5*  HGB 9.0*  HCT 29.6*  PLT 513*   ------------------------------------------------------------------------------------------------------------------  Chemistries  Recent Labs  Lab 01/28/19 1113  02/01/19 0336  NA 140   < > 141  K 5.0   < > 4.1  CL 109   < > 115*  CO2 23   < > 20*  GLUCOSE 175*   < > 99  BUN 29*   < > 25*  CREATININE 1.64*   < > 1.13*  CALCIUM 8.6*   < > 8.3*  MG  --   --  1.9  AST 16  --   --   ALT 15  --   --   ALKPHOS 109  --   --   BILITOT 0.6  --   --    < > = values in this interval not displayed.   ------------------------------------------------------------------------------------------------------------------  Cardiac Enzymes No results for input(s): TROPONINI in the last 168 hours. ------------------------------------------------------------------------------------------------------------------  RADIOLOGY:  No results found.   ASSESSMENT AND PLAN:    68 year old female with depression who goes from [facility] due to possible infected sacral decubitus ulcer.  1.  Sepsis: Patient presented with hypotension, leukocytosis.  Sepsis is due to infected sacral decubitus ulcer Continue cefepime and vancomycin. Pain control. Per Dr. Lysle Pearl, the wound is significantly worse than reported and the need debridement, but the patient refused debridement, continue antibiotics and wound care for now. Hold Plavix for debridement tomorrow.  Follow up CBC.  2.  Sacral decubitus ulcer: As above.  3.  Acute on chronic anemia: Anemia panel is consistent with iron deficiency.   Patient is status post 1 unit PRBC due to drop in hemoglobin.  She also received IV iron.   Folic acid also noted to be low which will be  replaced Hemoglobin is stable  4.  Protein calorie malnutrition: Continue nutritional supplements  5.  Depression: Patient is on Lexapro and clonazepam as needed  6.  Chronic pain syndrome on chronic methadone CKD  stage III.  Stable.  I discussed with Dr. Lysle Pearl. Discussed with her sister. Management plans discussed with the patient and she is in agreement.  CODE STATUS: DNR  TOTAL TIME TAKING CARE OF THIS PATIENT: 27 minutes.   Physical therapy is recommending skilled nursing facility upon discharge.  [facility] is from [facility].  POSSIBLE D/C 3 days, DEPENDING ON CLINICAL CONDITION.   [name] M.D on 02/01/2019 at 1:47 PM  Between 7am to 6pm - Pager - [phone] After 6pm go to [email or web address] - password [email or web address] [facility]  Office  [phone]  CC: Primary care physician; [name], [name], MD  Note: This dictation was prepared with Dragon dictation along with smaller phrase technology. Any transcriptional errors that result from this process are unintentional.

## 2019-02-01 NOTE — Plan of Care (Signed)
  Problem: Education: Goal: Knowledge of General Education information will improve Description: Including pain rating scale, medication(s)/side effects and non-pharmacologic comfort measures Outcome: Not Progressing   Problem: Health Behavior/Discharge Planning: Goal: Ability to manage health-related needs will improve Outcome: Not Progressing   Problem: Clinical Measurements: Goal: Ability to maintain clinical measurements within normal limits will improve Outcome: Not Progressing Goal: Will remain free from infection Outcome: Not Progressing Goal: Diagnostic test results will improve Outcome: Not Progressing Goal: Respiratory complications will improve Outcome: Not Progressing Goal: Cardiovascular complication will be avoided Outcome: Progressing   Problem: Activity: Goal: Risk for activity intolerance will decrease Outcome: Not Progressing   Problem: Nutrition: Goal: Adequate nutrition will be maintained Outcome: Progressing

## 2019-02-01 NOTE — Progress Notes (Signed)
   02/01/19 2000  Clinical Encounter Type  Visited With Patient  Visit Type Follow-up;Spiritual support  Referral From Other (Comment)  Spiritual Encounters  Spiritual Needs Prayer;Emotional;Other (Comment)  Stress Factors  Patient Stress Factors Health changes;Loss of control;Other (Comment)

## 2019-02-01 NOTE — Progress Notes (Signed)
[facility] Hospital Day(s): 4.   Post op day(s):  Marland Kitchen   Interval History: Patient seen and examined. Nurses denies any new event. No fever or chills. Patient looks chronically uncomfortable.    Vital signs in last 24 hours: [min-max] current  Temp:  [97.6 F (36.4 C)-97.9 F (36.6 C)] 97.9 F (36.6 C) (07/26 0817) Pulse Rate:  [74-84] 84 (07/26 0817) Resp:  [16-20] 16 (07/26 0817) BP: (133-149)/(72-94) 149/94 (07/26 0817) SpO2:  [99 %-100 %] 100 % (07/26 0817) Weight:  [54.2 kg] 54.2 kg (07/26 0458)     Height: 5\' 6"  (167.6 cm) Weight: 54.2 kg BMI (Calculated): 19.3   Physical Exam:  Constitutional: alert, cooperative and no distress  Back: large sacral ulcer with necrotic and foul-smelling tissue.  No fluid collection.  Labs:  CBC Latest Ref Rng & Units 02/01/2019 01/31/2019 01/30/2019  WBC 4.0 - 10.5 K/uL 13.5(H) 11.8(H) 11.9(H)  Hemoglobin 12.0 - 15.0 g/dL 9.0(L) 8.5(L) 8.3(L)  Hematocrit 36.0 - 46.0 % 29.6(L) 27.4(L) 26.2(L)  Platelets 150 - 400 K/uL 513(H) 413(H) 441(H)   CMP Latest Ref Rng & Units 02/01/2019 01/30/2019 01/29/2019  Glucose 70 - 99 mg/dL 99 169(H) 88  BUN 8 - 23 mg/dL 25(H) 28(H) 28(H)  Creatinine 0.44 - 1.00 mg/dL 1.13(H) 1.40(H) 1.54(H)  Sodium 135 - 145 mmol/L 141 138 142  Potassium 3.5 - 5.1 mmol/L 4.1 4.1 4.4  Chloride 98 - 111 mmol/L 115(H) 113(H) 115(H)  CO2 22 - 32 mmol/L 20(L) 21(L) 23  Calcium 8.9 - 10.3 mg/dL 8.3(L) 7.7(L) 7.8(L)  Total Protein 6.5 - 8.1 g/dL - - -  Total Bilirubin 0.3 - 1.2 mg/dL - - -  Alkaline Phos 38 - 126 U/L - - -  AST 15 - 41 U/L - - -  ALT 0 - 44 U/L - - -    Imaging studies: No new pertinent imaging studies   Assessment/Plan:  68 y.o. female with unstable sacral ulcer, complicated by pertinent comorbidities including hypertension, diabetes, hepatitis C, depression, peripheral vascular disease.  Patient currently with unstable sacral ulcer.  Will need debridement but holding Plavix to decrease the  risk of bleeding.  Patient already had transfusion during the admission due to anemia.  We will continue with Dakin's solution dressings.  There is no fever or tachycardia.  Adequate pressure.  We will continue care of her medical condition by primary team.  Hopefully tomorrow Monday or Tuesday.  Arnold Long, MD

## 2019-02-02 DIAGNOSIS — L89150 Pressure ulcer of sacral region, unstageable: Secondary | ICD-10-CM

## 2019-02-02 LAB — CBC WITH DIFFERENTIAL/PLATELET
Abs Immature Granulocytes: 0.08 10*3/uL — ABNORMAL HIGH (ref 0.00–0.07)
Basophils Absolute: 0 10*3/uL (ref 0.0–0.1)
Basophils Relative: 0 %
Eosinophils Absolute: 0.2 10*3/uL (ref 0.0–0.5)
Eosinophils Relative: 1 %
HCT: 33.4 % — ABNORMAL LOW (ref 36.0–46.0)
Hemoglobin: 10.3 g/dL — ABNORMAL LOW (ref 12.0–15.0)
Immature Granulocytes: 1 %
Lymphocytes Relative: 9 %
Lymphs Abs: 1.4 10*3/uL (ref 0.7–4.0)
MCH: 29.2 pg (ref 26.0–34.0)
MCHC: 30.8 g/dL (ref 30.0–36.0)
MCV: 94.6 fL (ref 80.0–100.0)
Monocytes Absolute: 1.2 10*3/uL — ABNORMAL HIGH (ref 0.1–1.0)
Monocytes Relative: 8 %
Neutro Abs: 12.6 10*3/uL — ABNORMAL HIGH (ref 1.7–7.7)
Neutrophils Relative %: 81 %
Platelets: 576 10*3/uL — ABNORMAL HIGH (ref 150–400)
RBC: 3.53 MIL/uL — ABNORMAL LOW (ref 3.87–5.11)
RDW: 15.1 % (ref 11.5–15.5)
WBC: 15.5 10*3/uL — ABNORMAL HIGH (ref 4.0–10.5)
nRBC: 0 % (ref 0.0–0.2)

## 2019-02-02 LAB — BASIC METABOLIC PANEL
Anion gap: 9 (ref 5–15)
BUN: 25 mg/dL — ABNORMAL HIGH (ref 8–23)
CO2: 20 mmol/L — ABNORMAL LOW (ref 22–32)
Calcium: 8.4 mg/dL — ABNORMAL LOW (ref 8.9–10.3)
Chloride: 110 mmol/L (ref 98–111)
Creatinine, Ser: 1.35 mg/dL — ABNORMAL HIGH (ref 0.44–1.00)
GFR calc Af Amer: 47 mL/min — ABNORMAL LOW (ref >60)
GFR calc non Af Amer: 40 mL/min — ABNORMAL LOW (ref >60)
Glucose, Bld: 104 mg/dL — ABNORMAL HIGH (ref 70–99)
Potassium: 4.1 mmol/L (ref 3.5–5.1)
Sodium: 139 mmol/L (ref 135–145)

## 2019-02-02 LAB — CULTURE, BLOOD (ROUTINE X 2)
Culture: NO GROWTH
Culture: NO GROWTH
Special Requests: ADEQUATE
Special Requests: ADEQUATE

## 2019-02-02 LAB — GLUCOSE, CAPILLARY: Glucose-Capillary: 97 mg/dL (ref 70–99)

## 2019-02-02 LAB — MAGNESIUM: Magnesium: 1.8 mg/dL (ref 1.7–2.4)

## 2019-02-02 LAB — PHOSPHORUS: Phosphorus: 3 mg/dL (ref 2.5–4.6)

## 2019-02-02 NOTE — TOC Progression Note (Signed)
Transition of Care [facility]) - Progression Note    Patient Details  Name: Yesenia Shepard MRN: [id number] Date of Birth: January 14, 1951  [facility] CM/SW Contact  Remiel Corti, Lenice Llamas Phone Number: [phone]  02/02/2019, 4:33 PM  Clinical Narrative: Per [facility] denied SNF because patient is not medically stable for discharge. Otila Kluver will start [facility] SNF authorization again once patient is closer to discharge.           Expected Discharge Plan and Services                                                 Social Determinants of Health (SDOH) Interventions    Readmission Risk Interventions Readmission Risk Prevention Plan 01/30/2019  Transportation Screening Complete  Medication Review ([place]) Complete  HRI or Home Care Consult Complete  SW Recovery Care/Counseling Consult Complete  Palliative Care Screening Not Applicable  Skilled Nursing Facility Complete  Some recent data might be hidden

## 2019-02-02 NOTE — Progress Notes (Signed)
[place] at [place] NAME: Yesenia Shepard    MR#:  [id number]  DATE OF BIRTH:  October 30, 1950  SUBJECTIVE:  Patient still complains sacral pain. REVIEW OF SYSTEMS:    Review of Systems  Constitutional: Negative for fever, chills weight loss HENT: Negative for ear pain, nosebleeds, congestion, facial swelling, rhinorrhea, neck pain, neck stiffness and ear discharge.   Respiratory: Negative for cough, shortness of breath, wheezing  Cardiovascular: Negative for chest pain, palpitations and leg swelling.  Gastrointestinal: Negative for heartburn, abdominal pain, vomiting, diarrhea or consitpation Genitourinary: Negative for dysuria, urgency, frequency, hematuria Musculoskeletal: Negative for back pain or joint pain Neurological: Negative for dizziness, seizures, syncope, focal weakness,  numbness and headaches.  Hematological: Does not bruise/bleed easily.  Psychiatric/Behavioral: Negative for hallucinations, confusion, dysphoric mood Skin: Sacral decubitus ulcer DRUG ALLERGIES:  No Known Allergies  VITALS:  Blood pressure 139/69, pulse 81, temperature 97.8 F (36.6 C), temperature source Oral, resp. rate 16, height 5\' 6"  (1.676 m), weight 53.4 kg, SpO2 100 %.  PHYSICAL EXAMINATION:  Constitutional: Appears thin and frail no distress. HENT: Normocephalic.  Eyes: Conjunctivae and EOM are normal. PERRLA, no scleral icterus.  Neck: Normal ROM. Neck supple. No JVD. No tracheal deviation. CVS: RRR, S1/S2 +, no murmurs, no gallops, no carotid bruit.  Pulmonary: Effort and breath sounds normal, no stridor, rhonchi, wheezes, rales.  Abdominal: Soft. BS +,  no distension, tenderness, rebound or guarding.  Musculoskeletal: Normal range of motion. No edema and no tenderness.  Neuro: Alert. CN 2-12 grossly intact. No focal deficits. Skin: Stage IV/unstageable decubitus ulcer in dressing. Psychiatric: Flat affect.  LABORATORY PANEL:   CBC Recent Labs  Lab  02/02/19 0342  WBC 15.5*  HGB 10.3*  HCT 33.4*  PLT 576*   ------------------------------------------------------------------------------------------------------------------  Chemistries  Recent Labs  Lab 01/28/19 1113  02/02/19 0342  NA 140   < > 139  K 5.0   < > 4.1  CL 109   < > 110  CO2 23   < > 20*  GLUCOSE 175*   < > 104*  BUN 29*   < > 25*  CREATININE 1.64*   < > 1.35*  CALCIUM 8.6*   < > 8.4*  MG  --    < > 1.8  AST 16  --   --   ALT 15  --   --   ALKPHOS 109  --   --   BILITOT 0.6  --   --    < > = values in this interval not displayed.   ------------------------------------------------------------------------------------------------------------------  Cardiac Enzymes No results for input(s): TROPONINI in the last 168 hours. ------------------------------------------------------------------------------------------------------------------  RADIOLOGY:  No results found.   ASSESSMENT AND PLAN:    68 year old female with depression who goes from [facility] due to possible infected sacral decubitus ulcer.  1.  Sepsis: Patient presented with hypotension, leukocytosis.  Sepsis is due to infected sacral decubitus ulcer Continue cefepime and vancomycin. Pain control. Per Dr. Lysle Pearl, the wound is significantly worse than reported and the need debridement, but the patient refused debridement, continue antibiotics and wound care for now. Hold Plavix for debridement tomorrow per Dr. Celine Ahr.   Worsening leukocytosis, follow up CBC.  2.  Sacral decubitus ulcer: As above.  3.  Acute on chronic anemia: Anemia panel is consistent with iron deficiency.   Patient is status post 1 unit PRBC due to drop in hemoglobin.  She also received IV iron.   Folic acid  also noted to be low which will be replaced Hemoglobin is stable  4.  Protein calorie malnutrition: Continue nutritional supplements  5.  Depression: Patient is on Lexapro and clonazepam as needed  6.  Chronic  pain syndrome on chronic methadone CKD stage III.  Stable.  Acute renal failure due to dehydration.  IV fluid support and follow-up BMP.  Discussed with her sister. Management plans discussed with the patient and she is in agreement.  CODE STATUS: DNR  TOTAL TIME TAKING CARE OF THIS PATIENT: 28 minutes.   Physical therapy is recommending skilled nursing facility upon discharge.  [facility] is [facility].  POSSIBLE D/C 3 days, DEPENDING ON CLINICAL CONDITION.   [name] M.D on 02/02/2019 at 1:57 PM  Between 7am to 6pm - Pager - [phone] After 6pm go to [email or web address] - password [email or web address] [facility]  Office  [phone]  CC: Primary care physician; [name], [name], MD  Note: This dictation was prepared with Dragon dictation along with smaller phrase technology. Any transcriptional errors that result from this process are unintentional.

## 2019-02-02 NOTE — Care Management Important Message (Signed)
Important Message  Patient Details  Name: Yesenia Shepard MRN: [id number] Date of Birth: May 06, 1951   Medicare Important Message Given:  Yes     Juliann Pulse A Sreekar Broyhill 02/02/2019, 11:17 AM

## 2019-02-02 NOTE — Consult Note (Signed)
Pharmacy Antibiotic Note  Yesenia Shepard is a 68 y.o. female admitted on 01/28/2019 with wound infection.  Pharmacy has been consulted for Cefepime dosing.  Vancomycin d/c'd 7/24    Plan: Day 6- Continue Cefepime 2g q12h.     Height: 5\' 6"  (167.6 cm) Weight: 117 lb 11.6 oz (53.4 kg) IBW/kg (Calculated) : 59.3  Temp (24hrs), Avg:98 F (36.7 C), Min:97.8 F (36.6 C), Max:98.5 F (36.9 C)  Recent Labs  Lab 01/28/19 1113 01/28/19 1114 01/28/19 1515 01/29/19 0354 01/30/19 0301 01/31/19 0649 02/01/19 0336 02/02/19 0342  WBC 17.6*  --   --  15.1* 11.9* 11.8* 13.5* 15.5*  CREATININE 1.64*  --   --  1.54* 1.40*  --  1.13* 1.35*  LATICACIDVEN  --  0.9 0.8  --   --   --   --   --     Estimated Creatinine Clearance: 33.6 mL/min (A) (by C-G formula based on SCr of 1.35 mg/dL (H)).    No Known Allergies  Antimicrobials this admission: 7/22 Cefepime >> 7/22 Vancomycin >> 7/24  Dose adjustments this admission: N/A  Microbiology results: 7/22 BCx: NG 7/22 UCx: multiple species 7/23 MRSA PCR neg   Thank you for allowing pharmacy to be a part of this patient's care.  Rocky Morel 02/02/2019 8:10 AM

## 2019-02-02 NOTE — TOC Progression Note (Signed)
Transition of Care [facility]) - Progression Note    Patient Details  Name: Yesenia Shepard MRN: [id number] Date of Birth: October 17, 1950  Transition of Care [facility]) CM/SW Contact  Lashunta Frieden, Lenice Llamas Phone Number: [phone]  02/02/2019, 9:19 AM  Clinical Narrative: Clinical Social Worker (CSW) contacted [facility] liaison and made her aware that per chart patient will be going to the OR tomorrow. Per Lavena Bullion SNF authorization is still pending. CSW will continue to follow and assist as needed.          Expected Discharge Plan and Services                                                 Social Determinants of Health (SDOH) Interventions    Readmission Risk Interventions Readmission Risk Prevention Plan 01/30/2019  Transportation Screening Complete  Medication Review ([place]) Complete  HRI or Home Care Consult Complete  SW Recovery Care/Counseling Consult Complete  Palliative Care Screening Not Applicable  Skilled Nursing Facility Complete  Some recent data might be hidden

## 2019-02-02 NOTE — Progress Notes (Addendum)
[facility] SURGICAL PROGRESS NOTE (cpt [phone])  Hospital Day(s): 5.   Post op day(s):  Marland Kitchen   Interval History: Patient seen and examined, no acute events or new complaints overnight. Patient notes still soreness around the sacral ulcer. History is difficult to obtain. No fevers. No other acute events/complaints.   Review of Systems:  Constitutional: denies fever, chills  Integumentary: + sacral decubitus ulcer   Vital signs in last 24 hours: [min-max] current  Temp:  [97.8 F (36.6 C)-98.5 F (36.9 C)] 97.8 F (36.6 C) (07/27 0759) Pulse Rate:  [79-81] 81 (07/27 0759) Resp:  [16-20] 16 (07/27 0759) BP: (137-168)/(69-89) 139/69 (07/27 0759) SpO2:  [99 %-100 %] 100 % (07/27 0759) Weight:  [53.4 kg] 53.4 kg (07/27 0448)     Height: 5\' 6"  (167.6 cm) Weight: 53.4 kg BMI (Calculated): 19.01   Intake/Output last 2 shifts:  07/26 0701 - 07/27 0700 In: 1440.7 [P.O.:240; I.V.:967.1; IV Piggyback:233.6] Out: -    Physical Exam:  Constitutional: alert, cooperative and no distress  HENT: normocephalic without obvious abnormality  Respiratory: breathing non-labored at rest  Integumentary: Large sacral decubitus ulcer with necrotic tissue from the 12-3 o'clock positions, malodorous, tender, bone is palpable.   Labs:  CBC Latest Ref Rng & Units 02/02/2019 02/01/2019 01/31/2019  WBC 4.0 - 10.5 K/uL 15.5(H) 13.5(H) 11.8(H)  Hemoglobin 12.0 - 15.0 g/dL 10.3(L) 9.0(L) 8.5(L)  Hematocrit 36.0 - 46.0 % 33.4(L) 29.6(L) 27.4(L)  Platelets 150 - 400 K/uL 576(H) 513(H) 413(H)   CMP Latest Ref Rng & Units 02/02/2019 02/01/2019 01/30/2019  Glucose 70 - 99 mg/dL 104(H) 99 169(H)  BUN 8 - 23 mg/dL 25(H) 25(H) 28(H)  Creatinine 0.44 - 1.00 mg/dL 1.35(H) 1.13(H) 1.40(H)  Sodium 135 - 145 mmol/L 139 141 138  Potassium 3.5 - 5.1 mmol/L 4.1 4.1 4.1  Chloride 98 - 111 mmol/L 110 115(H) 113(H)  CO2 22 - 32 mmol/L 20(L) 20(L) 21(L)  Calcium 8.9 - 10.3 mg/dL 8.4(L) 8.3(L) 7.7(L)  Total Protein  6.5 - 8.1 g/dL - - -  Total Bilirubin 0.3 - 1.2 mg/dL - - -  Alkaline Phos 38 - 126 U/L - - -  AST 15 - 41 U/L - - -  ALT 0 - 44 U/L - - -     Imaging studies: No new pertinent imaging studies   Assessment/Plan: (ICD-10's: L89.150) 68 y.o. female admitted with sepsis found to have large sacral decubitus ulcer. She does have a mildly worsening leukocytosis this morning but is otherwise afebrile and stable.    - Will plan for OR tomorrow for debridement and bone biopsy with Dr Celine Ahr pending OR/Anesthesia availability.    - All risks, benefits, and alternatives to above procedure(s) were discussed with the patient and patient DPOA over the weekend, all of their questions were answered to their expressed satisfaction, patient expresses they wish to proceed, and informed consent was obtained.   - Okay for diet today, NPO after midnight  - continue IV ABx (Cefepime)  - monitor leukocytosis  - further management per primary team   All of the above findings and recommendations were discussed with the patient, and the medical team, and all of patient's questions were answered to her expressed satisfaction.  -- Edison Simon, PA-C [facility] 02/02/2019, 9:04 AM [phone] M-F: 7am - 4pm   I agree with the above documentation, exam, and plan, which I have edited where appropriate. Fredirick Maudlin  9:24 AM

## 2019-02-03 ENCOUNTER — Encounter
Admission: EM | Disposition: A | Discharge status: Skilled Nursing Facility | Payer: Self-pay | Source: Skilled Nursing Facility | Attending: Internal Medicine

## 2019-02-03 ENCOUNTER — Inpatient Hospital Stay: Payer: Medicare HMO | Admitting: Anesthesiology

## 2019-02-03 ENCOUNTER — Encounter: Payer: Self-pay | Admitting: *Deleted

## 2019-02-03 DIAGNOSIS — L8995 Pressure ulcer of unspecified site, unstageable: Secondary | ICD-10-CM

## 2019-02-03 DIAGNOSIS — L089 Local infection of the skin and subcutaneous tissue, unspecified: Secondary | ICD-10-CM

## 2019-02-03 HISTORY — PX: APPLICATION OF WOUND VAC: SHX5189

## 2019-02-03 HISTORY — PX: IRRIGATION AND DEBRIDEMENT ABSCESS: SHX5252

## 2019-02-03 LAB — GLUCOSE, CAPILLARY
Glucose-Capillary: 136 mg/dL — ABNORMAL HIGH (ref 70–99)
Glucose-Capillary: 126 mg/dL — ABNORMAL HIGH (ref 70–99)
Glucose-Capillary: 108 mg/dL — ABNORMAL HIGH (ref 70–99)

## 2019-02-03 LAB — CBC WITH DIFFERENTIAL/PLATELET
Abs Immature Granulocytes: 0.07 10*3/uL (ref 0.00–0.07)
Basophils Absolute: 0 10*3/uL (ref 0.0–0.1)
Basophils Relative: 0 %
Eosinophils Absolute: 0.1 10*3/uL (ref 0.0–0.5)
Eosinophils Relative: 1 %
HCT: 28.9 % — ABNORMAL LOW (ref 36.0–46.0)
Hemoglobin: 9 g/dL — ABNORMAL LOW (ref 12.0–15.0)
Immature Granulocytes: 1 %
Lymphocytes Relative: 14 %
Lymphs Abs: 1.6 10*3/uL (ref 0.7–4.0)
MCH: 29.1 pg (ref 26.0–34.0)
MCHC: 31.1 g/dL (ref 30.0–36.0)
MCV: 93.5 fL (ref 80.0–100.0)
Monocytes Absolute: 1.1 10*3/uL — ABNORMAL HIGH (ref 0.1–1.0)
Monocytes Relative: 9 %
Neutro Abs: 8.7 10*3/uL — ABNORMAL HIGH (ref 1.7–7.7)
Neutrophils Relative %: 75 %
Platelets: 505 10*3/uL — ABNORMAL HIGH (ref 150–400)
RBC: 3.09 MIL/uL — ABNORMAL LOW (ref 3.87–5.11)
RDW: 15.3 % (ref 11.5–15.5)
WBC: 11.6 10*3/uL — ABNORMAL HIGH (ref 4.0–10.5)
nRBC: 0 % (ref 0.0–0.2)

## 2019-02-03 LAB — MAGNESIUM: Magnesium: 1.8 mg/dL (ref 1.7–2.4)

## 2019-02-03 LAB — BASIC METABOLIC PANEL
Anion gap: 6 (ref 5–15)
BUN: 24 mg/dL — ABNORMAL HIGH (ref 8–23)
CO2: 21 mmol/L — ABNORMAL LOW (ref 22–32)
Calcium: 8.1 mg/dL — ABNORMAL LOW (ref 8.9–10.3)
Chloride: 112 mmol/L — ABNORMAL HIGH (ref 98–111)
Creatinine, Ser: 1 mg/dL (ref 0.44–1.00)
GFR calc Af Amer: >60 mL/min (ref >60)
GFR calc non Af Amer: 58 mL/min — ABNORMAL LOW (ref >60)
Glucose, Bld: 136 mg/dL — ABNORMAL HIGH (ref 70–99)
Potassium: 4.1 mmol/L (ref 3.5–5.1)
Sodium: 139 mmol/L (ref 135–145)

## 2019-02-03 LAB — PHOSPHORUS: Phosphorus: 2.5 mg/dL (ref 2.5–4.6)

## 2019-02-03 SURGERY — IRRIGATION AND DEBRIDEMENT ABSCESS
Anesthesia: General

## 2019-02-03 MED ORDER — MIDAZOLAM HCL 2 MG/2ML IJ SOLN
INTRAMUSCULAR | Status: AC
  Filled 2019-02-03: qty 2

## 2019-02-03 MED ORDER — NEPRO/CARBSTEADY PO LIQD
237.0000 mL | Freq: Three times a day (TID) | ORAL | Status: DC
  Administered 2019-02-03 – 2019-02-07 (×9): 237 mL via ORAL
  Filled 2019-02-03: qty 237

## 2019-02-03 MED ORDER — LIDOCAINE HCL (PF) 2 % IJ SOLN
INTRAMUSCULAR | Status: AC
  Filled 2019-02-03: qty 10

## 2019-02-03 MED ORDER — NEOSTIGMINE METHYLSULFATE 10 MG/10ML IV SOLN
INTRAVENOUS | Status: DC | PRN
  Administered 2019-02-03: 3 mg via INTRAVENOUS

## 2019-02-03 MED ORDER — ROCURONIUM BROMIDE 50 MG/5ML IV SOLN
INTRAVENOUS | Status: AC
  Filled 2019-02-03: qty 1

## 2019-02-03 MED ORDER — GLYCOPYRROLATE 0.2 MG/ML IJ SOLN
INTRAMUSCULAR | Status: AC
  Filled 2019-02-03: qty 2

## 2019-02-03 MED ORDER — LIDOCAINE HCL 4 % MT SOLN
OROMUCOSAL | Status: DC | PRN
  Administered 2019-02-03: 2 mL via TOPICAL

## 2019-02-03 MED ORDER — ROCURONIUM BROMIDE 100 MG/10ML IV SOLN
INTRAVENOUS | Status: DC | PRN
  Administered 2019-02-03: 30 mg via INTRAVENOUS
  Administered 2019-02-03: 10 mg via INTRAVENOUS

## 2019-02-03 MED ORDER — FENTANYL CITRATE (PF) 100 MCG/2ML IJ SOLN: 25.0000 ug | INTRAMUSCULAR | Status: DC | PRN

## 2019-02-03 MED ORDER — DEXAMETHASONE SODIUM PHOSPHATE 10 MG/ML IJ SOLN
INTRAMUSCULAR | Status: DC | PRN
  Administered 2019-02-03: 5 mg via INTRAVENOUS

## 2019-02-03 MED ORDER — LIDOCAINE HCL (CARDIAC) PF 100 MG/5ML IV SOSY
PREFILLED_SYRINGE | INTRAVENOUS | Status: DC | PRN
  Administered 2019-02-03: 60 mg via INTRAVENOUS

## 2019-02-03 MED ORDER — FENTANYL CITRATE (PF) 100 MCG/2ML IJ SOLN
INTRAMUSCULAR | Status: DC | PRN
  Administered 2019-02-03: 50 ug via INTRAVENOUS
  Administered 2019-02-03: 100 ug via INTRAVENOUS
  Administered 2019-02-03: 50 ug via INTRAVENOUS

## 2019-02-03 MED ORDER — PROPOFOL 10 MG/ML IV BOLUS
INTRAVENOUS | Status: DC | PRN
  Administered 2019-02-03: 80 mg via INTRAVENOUS

## 2019-02-03 MED ORDER — GLYCOPYRROLATE 0.2 MG/ML IJ SOLN
INTRAMUSCULAR | Status: DC | PRN
  Administered 2019-02-03: .4 mg via INTRAVENOUS

## 2019-02-03 MED ORDER — PROPOFOL 10 MG/ML IV BOLUS
INTRAVENOUS | Status: AC
  Filled 2019-02-03: qty 20

## 2019-02-03 MED ORDER — SUGAMMADEX SODIUM 200 MG/2ML IV SOLN
INTRAVENOUS | Status: AC
  Filled 2019-02-03: qty 2

## 2019-02-03 MED ORDER — NEOSTIGMINE METHYLSULFATE 10 MG/10ML IV SOLN
INTRAVENOUS | Status: AC
  Filled 2019-02-03: qty 1

## 2019-02-03 MED ORDER — SODIUM CHLORIDE 0.9 % IV SOLN
INTRAVENOUS | Status: DC
  Administered 2019-02-03 – 2019-02-11 (×8): via INTRAVENOUS

## 2019-02-03 MED ORDER — DEXAMETHASONE SODIUM PHOSPHATE 10 MG/ML IJ SOLN
INTRAMUSCULAR | Status: AC
  Filled 2019-02-03: qty 1

## 2019-02-03 MED ORDER — FENTANYL CITRATE (PF) 250 MCG/5ML IJ SOLN
INTRAMUSCULAR | Status: AC
  Filled 2019-02-03: qty 5

## 2019-02-03 SURGICAL SUPPLY — 47 items
BLADE CLIPPER SURG (BLADE) ×3 IMPLANT
BLADE SURG 15 STRL LF DISP TIS (BLADE) ×2 IMPLANT
BLADE SURG 15 STRL SS (BLADE) ×3
BRIEF STRETCH MATERNITY 2XLG (MISCELLANEOUS) ×3 IMPLANT
BRUSH SCRUB EZ  4% CHG (MISCELLANEOUS)
BRUSH SCRUB EZ 4% CHG (MISCELLANEOUS) ×2 IMPLANT
CANISTER SUCT 1200ML W/VALVE (MISCELLANEOUS) ×3 IMPLANT
CANISTER WOUND CARE 500ML ATS (WOUND CARE) ×1 IMPLANT
CNTNR SPEC 2.5X3XGRAD LEK (MISCELLANEOUS) ×2
CONT SPEC 4OZ STER OR WHT (MISCELLANEOUS) ×1
CONT SPEC 4OZ STRL OR WHT (MISCELLANEOUS) ×2
CONTAINER SPEC 2.5X3XGRAD LEK (MISCELLANEOUS) IMPLANT
COVER WAND RF STERILE (DRAPES) ×3 IMPLANT
DRAPE LAPAROTOMY 77X122 PED (DRAPES) ×3 IMPLANT
DRAPE UNDER BUTTOCK W/FLU (DRAPES) ×2 IMPLANT
DRAPE XRAY CASSETTE 23X24 (DRAPES) ×1 IMPLANT
DRSG VAC ATS LRG SENSATRAC (GAUZE/BANDAGES/DRESSINGS) ×1 IMPLANT
DRSG VERSA FOAM LRG 10X15 (GAUZE/BANDAGES/DRESSINGS) ×1 IMPLANT
ELECT CAUTERY BLADE 6.4 (BLADE) ×3 IMPLANT
ELECT CAUTERY BLADE TIP 2.5 (TIP) ×3
ELECT REM PT RETURN 9FT ADLT (ELECTROSURGICAL) ×3
ELECTRODE CAUTERY BLDE TIP 2.5 (TIP) ×2 IMPLANT
ELECTRODE REM PT RTRN 9FT ADLT (ELECTROSURGICAL) ×2 IMPLANT
GAUZE PACKING IODOFORM 1X5 (MISCELLANEOUS) ×2 IMPLANT
GLOVE BIO SURGEON STRL SZ 6.5 (GLOVE) ×3 IMPLANT
GLOVE INDICATOR 7.0 STRL GRN (GLOVE) ×6 IMPLANT
GOWN STRL REUS W/ TWL LRG LVL3 (GOWN DISPOSABLE) ×4 IMPLANT
GOWN STRL REUS W/TWL LRG LVL3 (GOWN DISPOSABLE) ×6
IV NS IRRIG 3000ML ARTHROMATIC (IV SOLUTION) ×1 IMPLANT
KIT TURNOVER KIT A (KITS) ×3 IMPLANT
LOOP VESSEL SUPERMAXI WHITE (MISCELLANEOUS) ×3 IMPLANT
NEEDLE HYPO 22GX1.5 SAFETY (NEEDLE) ×3 IMPLANT
NS IRRIG 1000ML POUR BTL (IV SOLUTION) ×3 IMPLANT
NS IRRIG 500ML POUR BTL (IV SOLUTION) ×3 IMPLANT
PACK BASIN MINOR ARMC (MISCELLANEOUS) ×3 IMPLANT
PAD PREP 24X41 OB/GYN DISP (PERSONAL CARE ITEMS) ×2 IMPLANT
PULSAVAC PLUS IRRIG FAN TIP (DISPOSABLE) ×3
SOL PREP PVP 2OZ (MISCELLANEOUS) ×3
SOLUTION PREP PVP 2OZ (MISCELLANEOUS) ×2 IMPLANT
SPONGE LAP 18X18 RF (DISPOSABLE) ×3 IMPLANT
SURGILUBE 2OZ TUBE FLIPTOP (MISCELLANEOUS) ×2 IMPLANT
SWAB CULTURE AMIES ANAERIB BLU (MISCELLANEOUS) ×1 IMPLANT
SWAB DUAL CULTURE TRANS RED ST (MISCELLANEOUS) ×3 IMPLANT
SYR 20ML LL LF (SYRINGE) ×3 IMPLANT
SYR BULB 3OZ (MISCELLANEOUS) ×3 IMPLANT
TAPE TRANSPORE STRL 2 31045 (GAUZE/BANDAGES/DRESSINGS) ×1 IMPLANT
TIP FAN IRRIG PULSAVAC PLUS (DISPOSABLE) IMPLANT

## 2019-02-03 NOTE — Progress Notes (Signed)
PIV consult: Discussed with Anderson Malta, RN: Pt has several non-compressible veins in R and L arm on ultrasound.  Please keep current site patent by flushing at least Q12 hours. Pt may need alternative access if she will be inpatient more than a few days. Midline not placed due to Stage IV CKD listed in pt chart.

## 2019-02-03 NOTE — Transfer of Care (Signed)
Immediate Anesthesia Transfer of Care Note  Patient: Jeanell Sparrow  Procedure(s) Performed: IRRIGATION AND DEBRIDEMENT SACRAL DECUBITUS (N/A ) APPLICATION OF WOUND VAC  Patient Location: PACU  Anesthesia Type:General  Level of Consciousness: drowsy  Airway & Oxygen Therapy: Patient Spontanous Breathing and Patient connected to face mask oxygen  Post-op Assessment: Report given to RN and Post -op Vital signs reviewed and stable  Post vital signs: Reviewed and stable  Last Vitals:  Vitals Value Taken Time  BP 135/77 02/03/19 1458  Temp 36.3 C 02/03/19 1458  Pulse 83 02/03/19 1507  Resp 17 02/03/19 1507  SpO2 100 % 02/03/19 1507  Vitals shown include unvalidated device data.  Last Pain:  Vitals:   02/03/19 1458  TempSrc:   PainSc: 0-No pain      Patients Stated Pain Goal: 2 (75/44/92 0100)  Complications: No apparent anesthesia complications

## 2019-02-03 NOTE — Progress Notes (Signed)
[place] [facility] [place] NAME: Yesenia Shepard    MR#:  [id number]  DATE OF BIRTH:  08/14/1950  SUBJECTIVE:  Patient still complains sacral pain. REVIEW OF SYSTEMS:    Review of Systems  Constitutional: Negative for fever, chills weight loss HENT: Negative for ear pain, nosebleeds, congestion, facial swelling, rhinorrhea, neck pain, neck stiffness and ear discharge.   Respiratory: Negative for cough, shortness of breath, wheezing  Cardiovascular: Negative for chest pain, palpitations and leg swelling.  Gastrointestinal: Negative for heartburn, abdominal pain, vomiting, diarrhea or consitpation Genitourinary: Negative for dysuria, urgency, frequency, hematuria Musculoskeletal: Negative for back pain or joint pain Neurological: Negative for dizziness, seizures, syncope, focal weakness,  numbness and headaches.  Hematological: Does not bruise/bleed easily.  Psychiatric/Behavioral: Negative for hallucinations, confusion, dysphoric mood Skin: Sacral decubitus ulcer DRUG ALLERGIES:  No Known Allergies  VITALS:  Blood pressure (!) 142/76, pulse 81, temperature 99.5 F (37.5 C), temperature source Temporal, resp. rate 16, height 5\' 6"  (1.676 m), weight 53.4 kg, SpO2 100 %.  PHYSICAL EXAMINATION:  Constitutional: Appears thin and frail no distress. HENT: Normocephalic.  Eyes: Conjunctivae and EOM are normal. PERRLA, no scleral icterus.  Neck: Normal ROM. Neck supple. No JVD. No tracheal deviation. CVS: RRR, S1/S2 +, no murmurs, no gallops, no carotid bruit.  Pulmonary: Effort and breath sounds normal, no stridor, rhonchi, wheezes, rales.  Abdominal: Soft. BS +,  no distension, tenderness, rebound or guarding.  Musculoskeletal: Normal range of motion. No edema and no tenderness.  Neuro: Alert. CN 2-12 grossly intact. No focal deficits. Skin: Stage IV/unstageable decubitus ulcer in dressing. Psychiatric: Flat affect.  LABORATORY PANEL:   CBC Recent  Labs  Lab 02/03/19 0338  WBC 11.6*  HGB 9.0*  HCT 28.9*  PLT 505*   ------------------------------------------------------------------------------------------------------------------  Chemistries  Recent Labs  Lab 01/28/19 1113  02/03/19 0338  NA 140   < > 139  K 5.0   < > 4.1  CL 109   < > 112*  CO2 23   < > 21*  GLUCOSE 175*   < > 136*  BUN 29*   < > 24*  CREATININE 1.64*   < > 1.00  CALCIUM 8.6*   < > 8.1*  MG  --    < > 1.8  AST 16  --   --   ALT 15  --   --   ALKPHOS 109  --   --   BILITOT 0.6  --   --    < > = values in this interval not displayed.   ------------------------------------------------------------------------------------------------------------------  Cardiac Enzymes No results for input(s): TROPONINI in the last 168 hours. ------------------------------------------------------------------------------------------------------------------  RADIOLOGY:  No results found.   ASSESSMENT AND PLAN:    68 year old female with depression who goes from [facility] due to possible infected sacral decubitus ulcer.  1.  Sepsis: Patient presented with hypotension, leukocytosis.  Sepsis is due to infected sacral decubitus ulcer Continue cefepime and vancomycin. Pain control. Per Dr. Lysle Pearl, the wound is significantly worse than reported and the need debridement, but the patient refused debridement, continue antibiotics and wound care for now. Hold Plavix for debridement today per Dr. Celine Ahr.   Better leukocytosis.  2.  Sacral decubitus ulcer: As above.  3.  Acute on chronic anemia: Anemia panel is consistent with iron deficiency.   Patient is status post 1 unit PRBC due to drop in hemoglobin.  She also received IV iron.   Folic acid also noted  to be low which will be replaced Hemoglobin is stable  4.  Protein calorie malnutrition: Continue nutritional supplements  5.  Depression: Patient is on Lexapro and clonazepam as needed  6.  Chronic pain  syndrome on chronic methadone CKD stage III.  Stable.  Acute renal failure due to dehydration.  Improving with IV fluid support and follow-up BMP.  Discussed with her sister. Management plans discussed with the patient and she is in agreement.  CODE STATUS: DNR  TOTAL TIME TAKING CARE OF THIS PATIENT: 28 minutes.   Physical therapy is recommending skilled nursing facility upon discharge.  Patient is from [facility].  POSSIBLE D/C 2-3 days, DEPENDING ON CLINICAL CONDITION.   [name] M.D on 02/03/2019 at 2:12 PM  Between 7am to 6pm - Pager - [phone] After 6pm go to [email or web address] - password [email or web address] [facility]  Office  [phone]  CC: Primary care physician; [name], [name], MD  Note: This dictation was prepared with Dragon dictation along with smaller phrase technology. Any transcriptional errors that result from this process are unintentional.

## 2019-02-03 NOTE — Progress Notes (Signed)
PT Cancellation Note  Patient Details Name: Yesenia Shepard MRN: [id number] DOB: [birth year]   Cancelled Treatment:    Reason Eval/Treat Not Completed: Pain limiting ability to participate. Order received. Chart reviewed. Nurse communicated and reports pt is scheduled for surgery this afternoon. Nurse also reports that pt's pain level is high and recommend let pt rest before the surgery this afternoon. Will re-attempt PT session later time/date as appropriate.    Sherrilyn Rist, SPT 02/03/2019, 9:42 AM

## 2019-02-03 NOTE — TOC Progression Note (Signed)
Transition of Care [facility]) - Progression Note    Patient Details  Name: Yesenia Shepard MRN: [id number] Date of Birth: 1951-05-05  Transition of Care [facility]) CM/SW Contact  Jeily Guthridge, Lenice Llamas Phone Number: [phone]  02/03/2019, 10:24 AM  Clinical Narrative: Clinical Social Worker (CSW) made [facility] liaison aware that patient will likely need a wound vac.          Expected Discharge Plan and Services                                                 Social Determinants of Health (SDOH) Interventions    Readmission Risk Interventions Readmission Risk Prevention Plan 01/30/2019  Transportation Screening Complete  Medication Review ([place]) Complete  HRI or Home Care Consult Complete  SW Recovery Care/Counseling Consult Complete  Palliative Care Screening Not Applicable  Skilled Nursing Facility Complete  Some recent data might be hidden

## 2019-02-03 NOTE — Anesthesia Post-op Follow-up Note (Signed)
Anesthesia QCDR form completed.        

## 2019-02-03 NOTE — Progress Notes (Signed)
Dressing changed to sacrum

## 2019-02-03 NOTE — Progress Notes (Signed)
PT Cancellation Note  Patient Details Name: NINFA GIANNELLI MRN: [id number] DOB: September 30, 1950   Cancelled Treatment:    Reason Eval/Treat Not Completed: Patient at procedure or test/unavailable.  Pt still off floor d/t procedure today.  Will re-attempt PT session at a later date/time as medically appropriate.  Leitha Bleak, PT 02/03/19, 3:15 PM [phone]

## 2019-02-03 NOTE — Op Note (Signed)
Operative Note  Preoperative Diagnosis: Infected sacral decubitus ulcer  Postoperative Diagnosis: Same  Operation: Irrigation and debridement of sacral decubitus ulcer, deep bone biopsy, placement of negative pressure wound therapy (wound VAC, greater than 50 cm).  Total wound measurements are 12 x 12 x 2.5 cm.  The muscle involvement was 4 x 10 cm, with the remainder being subcutaneous tissue.  Surgeon: Fredirick Maudlin, MD  Assistant: None  Anesthesia: General  Findings: There was dead skin and muscle at the 12:00 to 3 o'clock position.  The area of muscle involvement was approximately 4 x 10 cm.  This was all debrided back to bleeding tissue.  An additional debridement of the subcutaneous tissues for a total wound area of 12 x 12 x 2.5 cm was performed.  There was visible exposed bone, which was biopsied.  Indications: This is a 68 year old woman with multiple comorbidities including vasculopathy and immobility.  She presented to [facility] with sepsis believed secondary to an infected sacral decubitus wound.  She was treated with antibiotics however the wound progressed despite local wound care.  Debridement was recommended.  Consent was obtained from the patient's medical power of attorney, her sister.  Procedure In Detail: The patient was identified in the preoperative holding area and brought to the operating room.  She was intubated on her hospital bed and then turned into a prone position on the OR table.  The body was supported on chest rolls and care was taken to appropriately pad all bony prominences.  The patient's existing dressing was removed.  The site was prepped and draped in standard fashion.  A timeout was performed confirming her identity, the procedure being performed, her allergies, all necessary equipment was available, and that maintenance anesthesia was adequate.  The necrotic skin from the 12:00 to 3 o'clock position was sharply excised.  Brisk bleeding from the skin was  present.  The underlying necrotic muscle was also sharply debrided back to bleeding tissue.  I then debrided the remainder of the wound with a combination of pulse lavage irrigation and curettage.  There was exposed bone at the sacrum.  Biopsies were taken using a rongeur.  These were sent for both culture and histopathology.  Once I felt the wound was clean and adequately debrided, hemostasis was obtained.  I elected to place a wound VAC.  White foam was placed over the exposed bone and then standard black granular foam over the remainder of the wound.  It was placed to 100 mmHg negative pressure with a good seal.  The patient was then turned supine, awakened, extubated, and taken to the postanesthesia care unit in stable condition  EBL: 25 cc  IVF: See anesthesia record  Specimen(s): Sacral bone for histopathology and culture  Complications: none immediately apparent.   Counts: all needles, instruments, and sponges were counted and reported to be correct in number at the end of the case.   I was present for and participated in the entire operation.  Fredirick Maudlin  3:06 PM

## 2019-02-03 NOTE — Consult Note (Signed)
Pharmacy Antibiotic Note  Yesenia Shepard is a 68 y.o. female admitted on 01/28/2019 with wound infection.  Pharmacy has been consulted for Cefepime dosing.  Vancomycin d/c'd 7/24    Plan: Day 7- Continue Cefepime 2g q12h.     Height: 5\' 6"  (167.6 cm) Weight: 117 lb 11.6 oz (53.4 kg) IBW/kg (Calculated) : 59.3  Temp (24hrs), Avg:98.4 F (36.9 C), Min:98.4 F (36.9 C), Max:98.5 F (36.9 C)  Recent Labs  Lab 01/28/19 1114 01/28/19 1515 01/29/19 0354 01/30/19 0301 01/31/19 0649 02/01/19 0336 02/02/19 0342 02/03/19 0338  WBC  --   --  15.1* 11.9* 11.8* 13.5* 15.5* 11.6*  CREATININE  --   --  1.54* 1.40*  --  1.13* 1.35* 1.00  LATICACIDVEN 0.9 0.8  --   --   --   --   --   --     Estimated Creatinine Clearance: 45.4 mL/min (by C-G formula based on SCr of 1 mg/dL).    No Known Allergies  Antimicrobials this admission: 7/22 Cefepime >> 7/22 Vancomycin >> 7/24  Dose adjustments this admission: N/A  Microbiology results: 7/22 BCx: NG 7/22 UCx: multiple species 7/23 MRSA PCR neg   Thank you for allowing pharmacy to be a part of this patient's care.  Rowland Lathe 02/03/2019 9:42 AM

## 2019-02-03 NOTE — Progress Notes (Signed)
Nutrition Follow-up  DOCUMENTATION CODES:   Not applicable  INTERVENTION:  Increase to Nepro Shake po TID, each supplement provides 425 kcal and 19 grams protein.  Continue MVI daily.  Continue vitamin C 250 mg BID.  NUTRITION DIAGNOSIS:   Increased nutrient needs related to wound healing as evidenced by estimated needs.  Ongoing.  GOAL:   Patient will meet greater than or equal to 90% of their needs  Progressing.  MONITOR:   PO intake, Supplement acceptance, Labs, Weight trends, Skin, I & O's  REASON FOR ASSESSMENT:   Consult Wound healing  ASSESSMENT:   68 year old female patient with multiple medical problems of hepatitis C, chronic pain syndrome, anxiety, essential hypertension [facility] because of hypotension, infected decubitus ulcers in the sacrum.  Unable to meet with patient today as she was in OR. Patient still in PACU. She underwent I&D of sacral decubitus ulcer, deep bone biopsy, placement of negative pressure wound therapy. Wound measurement was 12cm x 12cm x 2.5cm with muscle involvement of 4cm x 10cm. Per chart her PO intake is variable. She ate 35% of lunch yesterday and 90% of dinner yesterday. Breakfast meal completion was not documented yesterday. She has been NPO today.  She appears to be drinking her Nepro regularly.  Medications reviewed and include: vitamin D3 1000 units daily, Colace 100 mg BID, famotidine, folic acid 1 mg daily, Melatonin 5 mg QHS, MVI daily, vitamin C 250 mg BID, NS at 75 mL/hr, cefepime.  Labs reviewed: CBG 108-136, Chloride 112, CO2 21, BUN 24.  Diet Order:   Diet Order            Diet NPO time specified  Diet effective 0500        Diet - low sodium heart healthy             EDUCATION NEEDS:   No education needs have been identified at this time  Skin:  Skin Assessment: Skin Integrity Issues:(sacral pressure injury (12cm x 12cm x 2.5cm) with wound VAC in place; stg II left foot)  Last BM:   02/03/2019 - small type 2  Height:   Ht Readings from Last 1 Encounters:  01/28/19 5\' 6"  (1.676 m)   Weight:   Wt Readings from Last 1 Encounters:  02/02/19 53.4 kg   Ideal Body Weight:  59 kg  BMI:  Body mass index is 19 kg/m.  Estimated Nutritional Needs:   Kcal:  1750-1870  Protein:  85-95 grams  Fluid:  >1.4L/day  Willey Blade, MS, RD, LDN Office: [phone] Pager: [phone] After Hours/Weekend Pager: [phone]

## 2019-02-03 NOTE — Anesthesia Preprocedure Evaluation (Addendum)
Anesthesia Evaluation  Patient identified by MRN, date of birth, ID band Patient awake    Reviewed: Allergy & Precautions, NPO status , Patient's Chart, lab work & pertinent test results  History of Anesthesia Complications Negative for: history of anesthetic complications  Airway Mallampati: II  TM Distance: >3 FB    Comment: Small mouth Dental  (+) Missing, Poor Dentition   Pulmonary neg sleep apnea, neg COPD, Current Smoker, former smoker,    breath sounds clear to auscultation- rhonchi (-) wheezing      Cardiovascular hypertension, Pt. on medications + Peripheral Vascular Disease  (-) CAD, (-) Past MI and (-) Cardiac Stents  Rhythm:Regular Rate:Normal - Systolic murmurs and - Diastolic murmurs    Neuro/Psych PSYCHIATRIC DISORDERS Anxiety Depression Marked cognitive impairment - aware to person but not place or time, unable to answer questions appropriately Sciatica, chronic back pain    GI/Hepatic negative GI ROS, (+) Cirrhosis     substance abuse (on methadone therapy)  , Hepatitis -, C  Endo/Other  diabetes  Renal/GU CRFRenal disease     Musculoskeletal  (+) Arthritis , narcotic dependent  Abdominal (+) - obese,   Peds  Hematology  (+) Blood dyscrasia (Hgb 9.0), anemia ,   Anesthesia Other Findings Past Medical History: No date: Anxiety No date: Arthritis No date: Chronic pain No date: Depression No date: Drug abuse     Comment: History of polysubstance abuse; currently on               methadone. No date: Hepatitis     Comment: History of Hep "C". treated and cured with               Harvoni No date: History of chicken pox No date: Hypertension No date: Panic attacks No date: Peripheral vascular disease ([place])     Comment: stent in place.  No date: Pre-diabetes No date: UTI (urinary tract infection)     Comment: History of   Reproductive/Obstetrics                           Anesthesia Physical  Anesthesia Plan  ASA: III  Anesthesia Plan: General ETT   Post-op Pain Management:  Regional for Post-op pain   Induction: Intravenous  PONV Risk Score and Plan: Ondansetron  Airway Management Planned:   Additional Equipment:   Intra-op Plan:   Post-operative Plan:   Informed Consent: I have reviewed the patients History and Physical, chart, labs and discussed the procedure including the risks, benefits and alternatives for the proposed anesthesia with the patient or authorized representative who has indicated his/her understanding and acceptance.     Dental advisory given and Dental Advisory Given  Plan Discussed with: CRNA and Anesthesiologist  Anesthesia Plan Comments:       Anesthesia Quick Evaluation

## 2019-02-03 NOTE — Anesthesia Procedure Notes (Signed)
Procedure Name: Intubation Performed by: Eben Burow, CRNA Pre-anesthesia Checklist: Patient identified, Suction available, Patient being monitored, Timeout performed and Emergency Drugs available Patient Re-evaluated:Patient Re-evaluated prior to induction Oxygen Delivery Method: Circle system utilized Preoxygenation: Pre-oxygenation with 100% oxygen Induction Type: IV induction Ventilation: Mask ventilation without difficulty Laryngoscope Size: McGraph and 3 Grade View: Grade I Number of attempts: 1 Secured at: 19 cm Tube secured with: Tape

## 2019-02-03 NOTE — Progress Notes (Signed)
Per PACU nurse, there is not negative pressure to sacrum r/t extensive bleeding.

## 2019-02-03 NOTE — Progress Notes (Addendum)
[facility] Day(s): 6.   Post op day(s):  Marland Kitchen   Interval History: Patient seen and examined, no acute events or new complaints overnight. Patient still with sacral pain. No fevers. Again, history is somewhat difficult to obtained. Plan for debridement of sacral ulcer, bone biopsy, and possible wound vac placement in OR this afternoon with Dr Celine Ahr.   Vital signs in last 24 hours: [min-max] current  Temp:  [97.8 F (36.6 C)-98.5 F (36.9 C)] 98.4 F (36.9 C) (07/27 2300) Pulse Rate:  [78-82] 78 (07/27 2300) Resp:  [16-18] 18 (07/27 2300) BP: (134-139)/(64-69) 138/64 (07/27 2300) SpO2:  [98 %-100 %] 99 % (07/27 2300)     Height: 5\' 6"  (167.6 cm) Weight: 53.4 kg BMI (Calculated): 19.01   Intake/Output last 2 shifts:  07/27 0701 - 07/28 0700 In: 360 [P.O.:360] Out: -    Physical Exam:  Constitutional: alert, cooperative and no distress  HENT: normocephalic without obvious abnormality  Respiratory: breathing non-labored at rest  Integumentary: Large sacral decubitus ulcer with necrotic tissue from the 12-3 o'clock positions, malodorous, tender, bone is palpable.   Labs:  CBC Latest Ref Rng & Units 02/03/2019 02/02/2019 02/01/2019  WBC 4.0 - 10.5 K/uL 11.6(H) 15.5(H) 13.5(H)  Hemoglobin 12.0 - 15.0 g/dL 9.0(L) 10.3(L) 9.0(L)  Hematocrit 36.0 - 46.0 % 28.9(L) 33.4(L) 29.6(L)  Platelets 150 - 400 K/uL 505(H) 576(H) 513(H)   CMP Latest Ref Rng & Units 02/03/2019 02/02/2019 02/01/2019  Glucose 70 - 99 mg/dL 136(H) 104(H) 99  BUN 8 - 23 mg/dL 24(H) 25(H) 25(H)  Creatinine 0.44 - 1.00 mg/dL 1.00 1.35(H) 1.13(H)  Sodium 135 - 145 mmol/L 139 139 141  Potassium 3.5 - 5.1 mmol/L 4.1 4.1 4.1  Chloride 98 - 111 mmol/L 112(H) 110 115(H)  CO2 22 - 32 mmol/L 21(L) 20(L) 20(L)  Calcium 8.9 - 10.3 mg/dL 8.1(L) 8.4(L) 8.3(L)  Total Protein 6.5 - 8.1 g/dL - - -  Total Bilirubin 0.3 - 1.2 mg/dL - - -  Alkaline Phos 38 - 126 U/L - - -  AST 15 - 41 U/L  - - -  ALT 0 - 44 U/L - - -     Imaging studies: No new pertinent imaging studies   Assessment/Plan:  68 y.o. female with improved leukocytosis, admitted to the medicine service with sepsis and found to have large sacral decubitus ulcer with necrotic tissue present.     - Plan for debridement, bone biopsy, and possible wound vac placement in OR today with Dr Celine Ahr  - Discussed with patient's sister Coralyn Mark, who is medical DPOA) via telephone this morning along with the patient's RN this morning and obtained informed consent     - NPO   - continue IV ABx (Cefepime)             - monitor leukocytosis             - further management per primary team   All of the above findings and recommendations were discussed with the patient, patient's family (Medical DPOA - via telephone), and the medical team, and all of patient's family's questions were answered to their expressed satisfaction.  -- Edison Simon, PA-C [facility] 02/03/2019, 7:21 AM [phone] M-F: 7am - 4pm  I saw and evaluated the patient.  I agree with the above documentation, exam, and plan, which I have edited where appropriate. Felecity Lemaster  1:10 PM

## 2019-02-04 LAB — BASIC METABOLIC PANEL
Anion gap: 4 — ABNORMAL LOW (ref 5–15)
BUN: 20 mg/dL (ref 8–23)
CO2: 22 mmol/L (ref 22–32)
Calcium: 7.8 mg/dL — ABNORMAL LOW (ref 8.9–10.3)
Chloride: 116 mmol/L — ABNORMAL HIGH (ref 98–111)
Creatinine, Ser: 0.98 mg/dL (ref 0.44–1.00)
GFR calc Af Amer: >60 mL/min (ref >60)
GFR calc non Af Amer: 59 mL/min — ABNORMAL LOW (ref >60)
Glucose, Bld: 131 mg/dL — ABNORMAL HIGH (ref 70–99)
Potassium: 4.2 mmol/L (ref 3.5–5.1)
Sodium: 142 mmol/L (ref 135–145)

## 2019-02-04 LAB — CBC WITH DIFFERENTIAL/PLATELET
Abs Immature Granulocytes: 0.26 10*3/uL — ABNORMAL HIGH (ref 0.00–0.07)
Basophils Absolute: 0 10*3/uL (ref 0.0–0.1)
Basophils Relative: 0 %
Eosinophils Absolute: 0.1 10*3/uL (ref 0.0–0.5)
Eosinophils Relative: 1 %
HCT: 25.1 % — ABNORMAL LOW (ref 36.0–46.0)
Hemoglobin: 7.5 g/dL — ABNORMAL LOW (ref 12.0–15.0)
Immature Granulocytes: 3 %
Lymphocytes Relative: 18 %
Lymphs Abs: 1.9 10*3/uL (ref 0.7–4.0)
MCH: 29 pg (ref 26.0–34.0)
MCHC: 29.9 g/dL — ABNORMAL LOW (ref 30.0–36.0)
MCV: 96.9 fL (ref 80.0–100.0)
Monocytes Absolute: 0.9 10*3/uL (ref 0.1–1.0)
Monocytes Relative: 9 %
Neutro Abs: 7.2 10*3/uL (ref 1.7–7.7)
Neutrophils Relative %: 69 %
Platelets: 448 10*3/uL — ABNORMAL HIGH (ref 150–400)
RBC: 2.59 MIL/uL — ABNORMAL LOW (ref 3.87–5.11)
RDW: 15.6 % — ABNORMAL HIGH (ref 11.5–15.5)
WBC: 10.4 10*3/uL (ref 4.0–10.5)
nRBC: 0 % (ref 0.0–0.2)

## 2019-02-04 LAB — MAGNESIUM: Magnesium: 1.8 mg/dL (ref 1.7–2.4)

## 2019-02-04 LAB — PHOSPHORUS: Phosphorus: 2.5 mg/dL (ref 2.5–4.6)

## 2019-02-04 LAB — GLUCOSE, CAPILLARY: Glucose-Capillary: 106 mg/dL — ABNORMAL HIGH (ref 70–99)

## 2019-02-04 MED ORDER — ASPIRIN 81 MG PO CHEW: 81.0000 mg | CHEWABLE_TABLET | Freq: Every day | ORAL | Status: DC

## 2019-02-04 MED ORDER — CLOPIDOGREL BISULFATE 75 MG PO TABS: 75.0000 mg | ORAL_TABLET | Freq: Every day | ORAL | Status: DC

## 2019-02-04 NOTE — Consult Note (Signed)
Pharmacy Antibiotic Note  Yesenia Shepard is a 68 y.o. female admitted on 01/28/2019 with wound infection.  Pharmacy has been consulted for Cefepime dosing.  Vancomycin d/c'd 7/24    Plan: Day 8- Continue Cefepime 2g q12h.     Height: 5\' 6"  (167.6 cm) Weight: 118 lb 9.7 oz (53.8 kg) IBW/kg (Calculated) : 59.3  Temp (24hrs), Avg:98.1 F (36.7 C), Min:97.3 F (36.3 C), Max:99.5 F (37.5 C)  Recent Labs  Lab 01/28/19 1114 01/28/19 1515  01/30/19 0301 01/31/19 0649 02/01/19 0336 02/02/19 0342 02/03/19 0338 02/04/19 0345  WBC  --   --    < > 11.9* 11.8* 13.5* 15.5* 11.6* 10.4  CREATININE  --   --    < > 1.40*  --  1.13* 1.35* 1.00 0.98  LATICACIDVEN 0.9 0.8  --   --   --   --   --   --   --    < > = values in this interval not displayed.    Estimated Creatinine Clearance: 46.7 mL/min (by C-G formula based on SCr of 0.98 mg/dL).    No Known Allergies  Antimicrobials this admission: 7/22 Cefepime >> 7/22 Vancomycin >> 7/24  Dose adjustments this admission: N/A  Microbiology results: 7/22 BCx: NG 7/22 UCx: multiple species 7/23 MRSA PCR neg   Thank you for allowing pharmacy to be a part of this patient's care.  Rowland Lathe 02/04/2019 10:20 AM

## 2019-02-04 NOTE — Progress Notes (Addendum)
[facility] Day(s): 7.   Post op day(s): 1 Day Post-Op.   Interval History: Patient seen and examined, no acute events or new complaints overnight. History from patient is somewhat unreliable. She does endorse sacral pain this morning. Leukocytosis resolved. Hgb is 7.5  Vital signs in last 24 hours: [min-max] current  Temp:  [97.3 F (36.3 C)-99.5 F (37.5 C)] 98.1 F (36.7 C) (07/28 2346) Pulse Rate:  [69-90] 75 (07/28 2346) Resp:  [13-19] 19 (07/28 2346) BP: (118-142)/(59-79) 128/64 (07/28 2346) SpO2:  [98 %-100 %] 98 % (07/28 2346) Weight:  [53.8 kg] 53.8 kg (07/28 2346)     Height: 5\' 6"  (167.6 cm) Weight: 53.8 kg BMI (Calculated): 19.15   Intake/Output last 2 shifts:  07/28 0701 - 07/29 0700 In: 1476.2 [P.O.:240; I.V.:1016.2; NG/GT:120; IV Piggyback:100] Out: 615 [Urine:600; Blood:15]   Physical Exam:  Constitutional: alert, cooperative and no distress  Respiratory: breathing non-labored at rest  Integumentary: 12 x 12 x 2.5 cm sacral ulceration, bone palpable, no gross necrotic tissue, wound packed with wet-to-dry dressings  Labs:  CBC Latest Ref Rng & Units 02/04/2019 02/03/2019 02/02/2019  WBC 4.0 - 10.5 K/uL 10.4 11.6(H) 15.5(H)  Hemoglobin 12.0 - 15.0 g/dL 7.5(L) 9.0(L) 10.3(L)  Hematocrit 36.0 - 46.0 % 25.1(L) 28.9(L) 33.4(L)  Platelets 150 - 400 K/uL 448(H) 505(H) 576(H)   CMP Latest Ref Rng & Units 02/04/2019 02/03/2019 02/02/2019  Glucose 70 - 99 mg/dL 131(H) 136(H) 104(H)  BUN 8 - 23 mg/dL 20 24(H) 25(H)  Creatinine 0.44 - 1.00 mg/dL 0.98 1.00 1.35(H)  Sodium 135 - 145 mmol/L 142 139 139  Potassium 3.5 - 5.1 mmol/L 4.2 4.1 4.1  Chloride 98 - 111 mmol/L 116(H) 112(H) 110  CO2 22 - 32 mmol/L 22 21(L) 20(L)  Calcium 8.9 - 10.3 mg/dL 7.8(L) 8.1(L) 8.4(L)  Total Protein 6.5 - 8.1 g/dL - - -  Total Bilirubin 0.3 - 1.2 mg/dL - - -  Alkaline Phos 38 - 126 U/L - - -  AST 15 - 41 U/L - - -  ALT 0 - 44 U/L - - -      Imaging studies: No new pertinent imaging studies   Assessment/Plan: 68 y.o. female with improved leukocytosis 1 Day Post-Op s/p incision and debridement for infected sacral decubitus ulceration   - Will plan for wound vac placement in OR tomorrow with Dr Celine Ahr, will obtain consent from medical DPOA tomorrow morning.   - Continue wet-to-dry dressing changes daily + prn  - NPO after midnight  - Continue IV ABx (Cefepime); follow up Cx    - pain control prn  - monitor leukocytosis; hgb   - further management per primary team  All of the above findings and recommendations were discussed with the medical team.   -- Edison Simon, PA-C [facility] 02/04/2019, 7:22 AM [phone] M-F: 7am - 4pm   I saw and evaluated the patient.  I agree with the above documentation, exam, and plan, which I have edited where appropriate. Fredirick Maudlin  9:44 AM

## 2019-02-04 NOTE — Evaluation (Addendum)
Physical Therapy Re-Evaluation Patient Details Name: Yesenia Shepard MRN: [id number] DOB: 1951-03-20 Today's Date: 02/04/2019   History of Present Illness  Pt presented to ER 7/22 from STR secondary to hypotension, weakness; admitted for management of UTI, infected sacral decubitus.  PMH includes L foot 5th ray resection 10/26/18.  Pt s/p I&D sacral decubitus ulcer and deep bone biopsy 02/03/19; continue at transfer PT order received post op.  Clinical Impression  PT continue at transfer order received s/p procedure yesterday so re-evaluation performed.  Pt resting in bed with B mitts on upon PT arrival.  Oriented to name and place (and reported it was June); able to report having procedure recently but had difficulties with any details; pt unable to state DOB or year.  Pt initially appearing eager to participate in physical therapy but upon assisting pt with logrolling to L side pt c/o having too much sacral/buttocks pain and declined any further mobility attempts.  Significant B LE plantar flexion contractures noted.  Discussed performing ex's in bed but pt reporting feeling too wet and needing to be cleaned up first (pt noted with urinary incontinence in bed even though pt had Purewick in place); unable to find NT but unit secretary notified and reported she would contact NT or nurse to assist pt with clean-up.  Anticipate slow guarded progress with therapy.  Pt would benefit from skilled PT to address noted impairments and functional limitations (see below for any additional details).  Upon hospital discharge, recommend pt discharge back to STR pending level of participation and progression with therapy.  May benefit from LTC s/p rehab.  POC reviewed and remains appropriate.    Follow Up Recommendations SNF    Equipment Recommendations       Recommendations for Other Services       Precautions / Restrictions Precautions Precautions: Fall Precaution Comments: sacral decub, L heel  decub Restrictions Weight Bearing Restrictions: No      Mobility  Bed Mobility Overal bed mobility: Needs Assistance Bed Mobility: Rolling Rolling: Mod assist         General bed mobility comments: assist for logrolling to L side; pt declined further mobility d/t sacral/buttocks pain  Transfers                 General transfer comment: unable to tolerate d/t pain  Ambulation/Gait             General Gait Details: unable to tolerate d/t pain  Stairs            Wheelchair Mobility    Modified Rankin (Stroke Patients Only)       Balance                                             Pertinent Vitals/Pain Faces Pain Scale: Hurts a little bit Pain Location: buttocks Pain Descriptors / Indicators: Grimacing;Discomfort Pain Intervention(s): Limited activity within patient's tolerance;Monitored during session;Repositioned(nurse aware of pt's pain and reports recent pain meds)    Home Living Family/patient expects to be discharged to:: Skilled nursing facility                      Prior Function Level of Independence: Needs assistance         Comments: At baseline, ambulatory with RW; reports non-ambulatory for "several months" since LE wound issues     Hand  Dominance   Dominant Hand: Right    Extremity/Trunk Assessment   Upper Extremity Assessment Upper Extremity Assessment: Generalized weakness    Lower Extremity Assessment Lower Extremity Assessment: (grossly 3-/5 B LE's; significant B ankle PF contractures)       Communication   Communication: No difficulties  Cognition Arousal/Alertness: Awake/alert Behavior During Therapy: WFL for tasks assessed/performed Overall Cognitive Status: No family/caregiver present to determine baseline cognitive functioning                                 General Comments: Oriented to name (not DOB), reports it is June but did not know year, reports she is in  hospital and remembers having recent surgery and plan for future surgery but not sure when      General Comments   Nursing cleared pt for participation in physical therapy.  Pt agreeable to PT session.    Exercises     Assessment/Plan    PT Assessment Patient needs continued PT services  PT Problem List Decreased strength;Decreased range of motion;Decreased activity tolerance;Decreased balance;Decreased mobility;Decreased knowledge of use of DME;Decreased safety awareness;Decreased knowledge of precautions;Decreased skin integrity;Pain       PT Treatment Interventions DME instruction;Gait training;Functional mobility training;Therapeutic activities;Therapeutic exercise;Balance training;Patient/family education    PT Goals (Current goals can be found in the Care Plan section)  Acute Rehab PT Goals Patient Stated Goal: pain control PT Goal Formulation: With patient Time For Goal Achievement: 02/18/19 Potential to Achieve Goals: Fair    Frequency Min 2X/week   Barriers to discharge Decreased caregiver support      Co-evaluation               AM-PAC PT "6 Clicks" Mobility  Outcome Measure Help needed turning from your back to your side while in a flat bed without using bedrails?: A Lot Help needed moving from lying on your back to sitting on the side of a flat bed without using bedrails?: Total Help needed moving to and from a bed to a chair (including a wheelchair)?: Total Help needed standing up from a chair using your arms (e.g., wheelchair or bedside chair)?: Total Help needed to walk in hospital room?: Total Help needed climbing 3-5 steps with a railing? : Total 6 Click Score: 7    End of Session Equipment Utilized During Treatment: Gait belt Activity Tolerance: Patient limited by pain Patient left: in bed;with call bell/phone within reach;with bed alarm set;Other (comment)(B LE's in prevalon boots) Nurse Communication: Mobility status;Precautions(pt incontinent  of urine in bed and requiring clean-up) PT Visit Diagnosis: Muscle weakness (generalized) (M62.81);Difficulty in walking, not elsewhere classified (R26.2);Pain    Time: 8110-3159 PT Time Calculation (min) (ACUTE ONLY): 18 min   Charges:   PT Evaluation $PT Re-evaluation: 1 Re-eval         Leitha Bleak, PT 02/04/19, 4:39 PM [phone]

## 2019-02-04 NOTE — Progress Notes (Addendum)
[facility] [facility] [place] NAME: Yesenia Shepard    MR#:  [id number]  DATE OF BIRTH:  1951-01-08  SUBJECTIVE:  Patient has no complaints.  S/p surgery with usual debridement REVIEW OF SYSTEMS:    Review of Systems  Constitutional: Negative for fever, chills weight loss HENT: Negative for ear pain, nosebleeds, congestion, facial swelling, rhinorrhea, neck pain, neck stiffness and ear discharge.   Respiratory: Negative for cough, shortness of breath, wheezing  Cardiovascular: Negative for chest pain, palpitations and leg swelling.  Gastrointestinal: Negative for heartburn, abdominal pain, vomiting, diarrhea or consitpation Genitourinary: Negative for dysuria, urgency, frequency, hematuria Musculoskeletal: Negative for back pain or joint pain Neurological: Negative for dizziness, seizures, syncope, focal weakness,  numbness and headaches.  Hematological: Does not bruise/bleed easily.  Psychiatric/Behavioral: Negative for hallucinations, confusion, dysphoric mood Skin: Sacral decubitus ulcer DRUG ALLERGIES:  No Known Allergies  VITALS:  Blood pressure 129/73, pulse 86, temperature 97.7 F (36.5 C), temperature source Oral, resp. rate 17, height 5\' 6"  (1.676 m), weight 53.8 kg, SpO2 100 %.  PHYSICAL EXAMINATION:  Constitutional: Appears thin and frail no distress. HENT: Normocephalic.  Eyes: Conjunctivae and EOM are normal. PERRLA, no scleral icterus.  Neck: Normal ROM. Neck supple. No JVD. No tracheal deviation. CVS: RRR, S1/S2 +, no murmurs, no gallops, no carotid bruit.  Pulmonary: Effort and breath sounds normal, no stridor, rhonchi, wheezes, rales.  Abdominal: Soft. BS +,  no distension, tenderness, rebound or guarding.  Musculoskeletal: Normal range of motion. No edema and no tenderness.  Neuro: Alert. CN 2-12 grossly intact. No focal deficits. Skin: Stage IV/unstageable decubitus ulcer in dressing. Psychiatric: Flat affect.  LABORATORY PANEL:    CBC Recent Labs  Lab 02/04/19 0345  WBC 10.4  HGB 7.5*  HCT 25.1*  PLT 448*   ------------------------------------------------------------------------------------------------------------------  Chemistries  Recent Labs  Lab 02/04/19 0345  NA 142  K 4.2  CL 116*  CO2 22  GLUCOSE 131*  BUN 20  CREATININE 0.98  CALCIUM 7.8*  MG 1.8   ------------------------------------------------------------------------------------------------------------------  Cardiac Enzymes No results for input(s): TROPONINI in the last 168 hours. ------------------------------------------------------------------------------------------------------------------  RADIOLOGY:  No results found.   ASSESSMENT AND PLAN:    68 year old female with depression who goes from [facility] due to possible infected sacral decubitus ulcer.  1.  Sepsis: Patient presented with hypotension, leukocytosis.  Sepsis is due to infected sacral decubitus ulcer Continue cefepime. Pain control. S/p sacral DU debridement.  Resume Plavix.  2.  Sacral decubitus ulcer: As above.  3.  Acute on chronic anemia: Anemia panel is consistent with iron deficiency.   Patient is status post 1 unit PRBC due to drop in hemoglobin.  She also received IV iron.   Hemoglobin decreased to 7.5.  Follow-up hemoglobin in a.m., PRBC transfusion PRN.  4.  Protein calorie malnutrition: Continue nutritional supplements  5.  Depression: Patient is on Lexapro and clonazepam as needed  6.  Chronic pain syndrome on chronic methadone CKD stage III.  Stable.  Acute renal failure due to dehydration.  Improved with IV fluid support.  Discussed with her sister. Management plans discussed with the patient and she is in agreement.  CODE STATUS: DNR  TOTAL TIME TAKING CARE OF THIS PATIENT: 27 minutes.   Physical therapy is recommending skilled nursing facility upon discharge.  Patient is from [facility].  POSSIBLE D/C 2-3 days,  DEPENDING ON CLINICAL CONDITION.   Yesenia Shepard M.D on 02/04/2019 at 12:01 PM  Between 7am  to 6pm - Pager - [phone] After 6pm go to [email or web address] - password [email or web address] [facility]  Office  [phone]  CC: Primary care physician; [name], [name], MD  Note: This dictation was prepared with Dragon dictation along with smaller phrase technology. Any transcriptional errors that result from this process are unintentional.

## 2019-02-04 NOTE — Care Management Important Message (Signed)
Important Message  Patient Details  Name: Yesenia Shepard MRN: [id number] Date of Birth: 06/01/1951   Medicare Important Message Given:  Yes     Juliann Pulse A Deontra Pereyra 02/04/2019, 12:17 PM

## 2019-02-04 NOTE — Anesthesia Postprocedure Evaluation (Signed)
Anesthesia Post Note  Patient: Yesenia Shepard  Procedure(s) Performed: IRRIGATION AND DEBRIDEMENT SACRAL DECUBITUS (N/A ) APPLICATION OF WOUND VAC  Patient location during evaluation: PACU Anesthesia Type: General Level of consciousness: awake and alert Pain management: pain level controlled Vital Signs Assessment: post-procedure vital signs reviewed and stable Respiratory status: spontaneous breathing, nonlabored ventilation, respiratory function stable and patient connected to nasal cannula oxygen Cardiovascular status: blood pressure returned to baseline and stable Postop Assessment: no apparent nausea or vomiting Anesthetic complications: no     Last Vitals:  Vitals:   02/03/19 2346 02/04/19 0820  BP: 128/64 129/73  Pulse: 75 86  Resp: 19 17  Temp: 36.7 C 36.5 C  SpO2: 98% 100%    Last Pain:  Vitals:   02/04/19 0820  TempSrc: Oral  PainSc:                  [name]

## 2019-02-04 NOTE — TOC Progression Note (Signed)
Transition of Care [facility]) - Progression Note    Patient Details  Name: Yesenia Shepard MRN: [id number] Date of Birth: 05-27-1951  Transition of Care [facility]) CM/SW Contact  Ja Ohman, Lenice Llamas Phone Number: [phone]  02/04/2019, 10:27 AM  Clinical Narrative: Per MD patient may be ready for D/C Friday. Clinical Education officer, museum (CSW) made [facility] liaison aware of above. Per Otila Kluver she will start [facility] authorization tomorrow.          Expected Discharge Plan and Services           Expected Discharge Date: 02/03/19                                     Social Determinants of Health (SDOH) Interventions    Readmission Risk Interventions Readmission Risk Prevention Plan 01/30/2019  Transportation Screening Complete  Medication Review Press photographer) Complete  HRI or [place] Complete  SW Recovery Care/Counseling Consult Complete  Palliative Care Screening Not Applicable  Skilled Nursing Facility Complete  Some recent data might be hidden

## 2019-02-05 ENCOUNTER — Inpatient Hospital Stay: Payer: Medicare HMO | Admitting: Anesthesiology

## 2019-02-05 ENCOUNTER — Encounter: Payer: Self-pay | Admitting: Anesthesiology

## 2019-02-05 ENCOUNTER — Encounter
Admission: EM | Disposition: A | Discharge status: Skilled Nursing Facility | Payer: Self-pay | Source: Skilled Nursing Facility | Attending: Internal Medicine

## 2019-02-05 HISTORY — PX: APPLICATION OF WOUND VAC: SHX5189

## 2019-02-05 LAB — MAGNESIUM: Magnesium: 1.8 mg/dL (ref 1.7–2.4)

## 2019-02-05 LAB — CBC WITH DIFFERENTIAL/PLATELET
Abs Immature Granulocytes: 0.09 10*3/uL — ABNORMAL HIGH (ref 0.00–0.07)
Basophils Absolute: 0 10*3/uL (ref 0.0–0.1)
Basophils Relative: 0 %
Eosinophils Absolute: 0.2 10*3/uL (ref 0.0–0.5)
Eosinophils Relative: 2 %
HCT: 24.8 % — ABNORMAL LOW (ref 36.0–46.0)
Hemoglobin: 7.4 g/dL — ABNORMAL LOW (ref 12.0–15.0)
Immature Granulocytes: 1 %
Lymphocytes Relative: 20 %
Lymphs Abs: 2.2 10*3/uL (ref 0.7–4.0)
MCH: 29.4 pg (ref 26.0–34.0)
MCHC: 29.8 g/dL — ABNORMAL LOW (ref 30.0–36.0)
MCV: 98.4 fL (ref 80.0–100.0)
Monocytes Absolute: 0.9 10*3/uL (ref 0.1–1.0)
Monocytes Relative: 8 %
Neutro Abs: 7.5 10*3/uL (ref 1.7–7.7)
Neutrophils Relative %: 69 %
Platelets: 471 10*3/uL — ABNORMAL HIGH (ref 150–400)
RBC: 2.52 MIL/uL — ABNORMAL LOW (ref 3.87–5.11)
RDW: 15.7 % — ABNORMAL HIGH (ref 11.5–15.5)
WBC: 10.9 10*3/uL — ABNORMAL HIGH (ref 4.0–10.5)
nRBC: 0 % (ref 0.0–0.2)

## 2019-02-05 LAB — SURGICAL PATHOLOGY

## 2019-02-05 LAB — PHOSPHORUS: Phosphorus: 1.9 mg/dL — ABNORMAL LOW (ref 2.5–4.6)

## 2019-02-05 LAB — GLUCOSE, CAPILLARY: Glucose-Capillary: 92 mg/dL (ref 70–99)

## 2019-02-05 LAB — BASIC METABOLIC PANEL
Anion gap: 5 (ref 5–15)
BUN: 18 mg/dL (ref 8–23)
CO2: 22 mmol/L (ref 22–32)
Calcium: 7.9 mg/dL — ABNORMAL LOW (ref 8.9–10.3)
Chloride: 113 mmol/L — ABNORMAL HIGH (ref 98–111)
Creatinine, Ser: 0.8 mg/dL (ref 0.44–1.00)
GFR calc Af Amer: >60 mL/min (ref >60)
GFR calc non Af Amer: >60 mL/min (ref >60)
Glucose, Bld: 103 mg/dL — ABNORMAL HIGH (ref 70–99)
Potassium: 4.1 mmol/L (ref 3.5–5.1)
Sodium: 140 mmol/L (ref 135–145)

## 2019-02-05 SURGERY — APPLICATION, WOUND VAC
Anesthesia: General

## 2019-02-05 MED ORDER — KETOROLAC TROMETHAMINE 30 MG/ML IJ SOLN
INTRAMUSCULAR | Status: DC | PRN
  Administered 2019-02-05: 30 mg via INTRAVENOUS

## 2019-02-05 MED ORDER — FENTANYL CITRATE (PF) 100 MCG/2ML IJ SOLN
INTRAMUSCULAR | Status: DC | PRN
  Administered 2019-02-05 (×2): 50 ug via INTRAVENOUS

## 2019-02-05 MED ORDER — FENTANYL CITRATE (PF) 100 MCG/2ML IJ SOLN
INTRAMUSCULAR | Status: AC
  Filled 2019-02-05: qty 2

## 2019-02-05 MED ORDER — POTASSIUM PHOSPHATES 15 MMOLE/5ML IV SOLN
15.0000 mmol | Freq: Once | INTRAVENOUS | Status: AC
  Administered 2019-02-05: 15 mmol via INTRAVENOUS
  Filled 2019-02-05: qty 5

## 2019-02-05 MED ORDER — DEXAMETHASONE SODIUM PHOSPHATE 10 MG/ML IJ SOLN
INTRAMUSCULAR | Status: DC | PRN
  Administered 2019-02-05: 4 mg via INTRAVENOUS

## 2019-02-05 MED ORDER — ONDANSETRON HCL 4 MG/2ML IJ SOLN: 4.0000 mg | Freq: Once | INTRAMUSCULAR | Status: DC | PRN

## 2019-02-05 MED ORDER — PROPOFOL 10 MG/ML IV BOLUS
INTRAVENOUS | Status: DC | PRN
  Administered 2019-02-05: 100 mg via INTRAVENOUS

## 2019-02-05 MED ORDER — FENTANYL CITRATE (PF) 100 MCG/2ML IJ SOLN: 25.0000 ug | INTRAMUSCULAR | Status: DC | PRN

## 2019-02-05 MED ORDER — SODIUM CHLORIDE 0.9 % IV SOLN
3.0000 g | Freq: Four times a day (QID) | INTRAVENOUS | Status: DC
  Administered 2019-02-05 – 2019-02-12 (×26): 3 g via INTRAVENOUS
  Filled 2019-02-05: qty 3
  Filled 2019-02-05: qty 8
  Filled 2019-02-05: qty 3
  Filled 2019-02-05: qty 8
  Filled 2019-02-05 (×2): qty 3
  Filled 2019-02-05: qty 8
  Filled 2019-02-05 (×4): qty 3
  Filled 2019-02-05: qty 8
  Filled 2019-02-05 (×2): qty 3
  Filled 2019-02-05 (×2): qty 8
  Filled 2019-02-05 (×2): qty 3
  Filled 2019-02-05: qty 8
  Filled 2019-02-05: qty 3
  Filled 2019-02-05: qty 8
  Filled 2019-02-05 (×2): qty 3
  Filled 2019-02-05: qty 8
  Filled 2019-02-05 (×4): qty 3
  Filled 2019-02-05: qty 8
  Filled 2019-02-05 (×2): qty 3
  Filled 2019-02-05 (×2): qty 8

## 2019-02-05 MED ORDER — SUCCINYLCHOLINE CHLORIDE 20 MG/ML IJ SOLN
INTRAMUSCULAR | Status: DC | PRN
  Administered 2019-02-05: 80 mg via INTRAVENOUS

## 2019-02-05 MED ORDER — CHLORHEXIDINE GLUCONATE CLOTH 2 % EX PADS
6.0000 | MEDICATED_PAD | Freq: Once | CUTANEOUS | Status: AC
  Administered 2019-02-05: 6 via TOPICAL

## 2019-02-05 MED ORDER — ROCURONIUM BROMIDE 100 MG/10ML IV SOLN
INTRAVENOUS | Status: DC | PRN
  Administered 2019-02-05: 5 mg via INTRAVENOUS

## 2019-02-05 SURGICAL SUPPLY — 22 items
CANISTER SUCT 1200ML W/VALVE (MISCELLANEOUS) ×2 IMPLANT
CANISTER WOUND CARE 500ML ATS (WOUND CARE) ×2 IMPLANT
COVER WAND RF STERILE (DRAPES) ×2 IMPLANT
DRAPE LAPAROTOMY 100X77 ABD (DRAPES) ×2 IMPLANT
DRSG VAC ATS MED SENSATRAC (GAUZE/BANDAGES/DRESSINGS) ×2 IMPLANT
DRSG VERSA FOAM LRG 10X15 (GAUZE/BANDAGES/DRESSINGS) ×1 IMPLANT
ELECT REM PT RETURN 9FT ADLT (ELECTROSURGICAL) ×2
ELECTRODE REM PT RTRN 9FT ADLT (ELECTROSURGICAL) ×1 IMPLANT
GLOVE BIO SURGEON STRL SZ 6.5 (GLOVE) ×2 IMPLANT
GLOVE INDICATOR 7.0 STRL GRN (GLOVE) ×4 IMPLANT
GOWN STRL REUS W/ TWL LRG LVL3 (GOWN DISPOSABLE) ×2 IMPLANT
GOWN STRL REUS W/TWL LRG LVL3 (GOWN DISPOSABLE) ×4
HANDPIECE INTERPULSE COAX TIP (DISPOSABLE)
KIT TURNOVER KIT A (KITS) ×2 IMPLANT
PACK BASIN MINOR ARMC (MISCELLANEOUS) ×2 IMPLANT
SET HNDPC FAN SPRY TIP SCT (DISPOSABLE) IMPLANT
SOL .9 NS 3000ML IRR  AL (IV SOLUTION) ×1
SOL .9 NS 3000ML IRR AL (IV SOLUTION) ×1
SOL .9 NS 3000ML IRR UROMATIC (IV SOLUTION) ×1 IMPLANT
SOL PREP PVP 2OZ (MISCELLANEOUS) ×2
SOLUTION PREP PVP 2OZ (MISCELLANEOUS) ×1 IMPLANT
SPONGE LAP 18X18 RF (DISPOSABLE) ×2 IMPLANT

## 2019-02-05 NOTE — Transfer of Care (Signed)
Immediate Anesthesia Transfer of Care Note  Patient: Jeanell Sparrow  Procedure(s) Performed: APPLICATION OF WOUND VAC POSS DEBRIDEMENT (N/A )  Patient Location: PACU  Anesthesia Type:General  Level of Consciousness: drowsy  Airway & Oxygen Therapy: Patient Spontanous Breathing and Patient connected to face mask oxygen  Post-op Assessment: Report given to RN and Post -op Vital signs reviewed and stable  Post vital signs: Reviewed and stable  Last Vitals:  Vitals Value Taken Time  BP 100/79   Temp    Pulse 81 02/05/19 1516  Resp    SpO2 100 % 02/05/19 1516  Vitals shown include unvalidated device data.  Last Pain:  Vitals:   02/05/19 1230  TempSrc: Oral  PainSc: 3       Patients Stated Pain Goal: 2 (50/01/64 2903)  Complications: No apparent anesthesia complications

## 2019-02-05 NOTE — Progress Notes (Signed)
[facility] NAME: Yesenia Shepard    MR#:  [id number]  DATE OF BIRTH:  03-20-1951  SUBJECTIVE:  Patient complains sacral pain..  S/p surgery with usual debridement REVIEW OF SYSTEMS:    Review of Systems  Constitutional: Negative for fever, chills weight loss HENT: Negative for ear pain, nosebleeds, congestion, facial swelling, rhinorrhea, neck pain, neck stiffness and ear discharge.   Respiratory: Negative for cough, shortness of breath, wheezing  Cardiovascular: Negative for chest pain, palpitations and leg swelling.  Gastrointestinal: Negative for heartburn, abdominal pain, vomiting, diarrhea or consitpation Genitourinary: Negative for dysuria, urgency, frequency, hematuria Musculoskeletal: Negative for back pain or joint pain Neurological: Negative for dizziness, seizures, syncope, focal weakness,  numbness and headaches.  Hematological: Does not bruise/bleed easily.  Psychiatric/Behavioral: Negative for hallucinations, confusion, dysphoric mood Skin: Sacral decubitus ulcer DRUG ALLERGIES:  No Known Allergies  VITALS:  Blood pressure (!) 145/68, pulse 81, temperature 97.7 F (36.5 C), temperature source Oral, resp. rate 16, height 5\' 6"  (1.676 m), weight 53 kg, SpO2 100 %.  PHYSICAL EXAMINATION:  Constitutional: Appears thin and frail no distress. HENT: Normocephalic.  Eyes: Conjunctivae and EOM are normal. PERRLA, no scleral icterus.  Neck: Normal ROM. Neck supple. No JVD. No tracheal deviation. CVS: RRR, S1/S2 +, no murmurs, no gallops, no carotid bruit.  Pulmonary: Effort and breath sounds normal, no stridor, rhonchi, wheezes, rales.  Abdominal: Soft. BS +,  no distension, tenderness, rebound or guarding.  Musculoskeletal: Normal range of motion. No edema and no tenderness.  Neuro: Alert. CN 2-12 grossly intact. No focal deficits. Skin: Stage IV/unstageable decubitus ulcer in dressing. Psychiatric: Flat affect.  LABORATORY  PANEL:   CBC Recent Labs  Lab 02/05/19 0544  WBC 10.9*  HGB 7.4*  HCT 24.8*  PLT 471*   ------------------------------------------------------------------------------------------------------------------  Chemistries  Recent Labs  Lab 02/05/19 0544  NA 140  K 4.1  CL 113*  CO2 22  GLUCOSE 103*  BUN 18  CREATININE 0.80  CALCIUM 7.9*  MG 1.8   ------------------------------------------------------------------------------------------------------------------  Cardiac Enzymes No results for input(s): TROPONINI in the last 168 hours. ------------------------------------------------------------------------------------------------------------------  RADIOLOGY:  No results found.   ASSESSMENT AND PLAN:    68 year old female with depression who goes from [facility] due to possible infected sacral decubitus ulcer.  1.  Sepsis: Patient presented with hypotension, leukocytosis.  Sepsis is due to infected sacral decubitus ulcer Continue cefepime. Pain control. S/p sacral DU debridement.  Wound VAC placement today. Resume Plavix after procedure.  2.  Sacral decubitus ulcer: As above.  3.  Acute on chronic anemia: Anemia panel is consistent with iron deficiency.   Patient is status post 1 unit PRBC due to drop in hemoglobin.  She also received IV iron.   Hemoglobin decreased to 7.4.  Follow-up hemoglobin in a.m., PRBC transfusion PRN.  4.  Protein calorie malnutrition: Continue nutritional supplements  5.  Depression: Patient is on Lexapro and clonazepam as needed  6.  Chronic pain syndrome on chronic methadone CKD stage III.  Stable.  Acute renal failure due to dehydration.  Improved with IV fluid support.  Discussed with her sister. Management plans discussed with the patient and she is in agreement.  CODE STATUS: DNR  TOTAL TIME TAKING CARE OF THIS PATIENT: 26 minutes.   Physical therapy is recommending skilled nursing facility upon discharge.  Patient is  from [facility].  POSSIBLE D/C 2 days, DEPENDING ON CLINICAL CONDITION.   [name] M.D on  02/05/2019 at 12:01 PM  Between 7am to 6pm - Pager - [phone] After 6pm go to [email or web address] - password [email or web address] [facility]  Office  [phone]  CC: Primary care physician; [name], [name], MD  Note: This dictation was prepared with Dragon dictation along with smaller phrase technology. Any transcriptional errors that result from this process are unintentional.

## 2019-02-05 NOTE — TOC Progression Note (Signed)
Transition of Care [facility]) - Progression Note    Patient Details  Name: Yesenia Shepard MRN: [id number] Date of Birth: 04-02-1951  Transition of Care [facility]) CM/SW Contact  Beverly Sessions, RN Phone Number: 02/05/2019, 2:02 PM  Clinical Narrative:    PT to go back to OR today for vac placement and possibility further debridement.   [name] notified.  RNCM to update [name] post procedure that way she is able to determine when to start auth        Expected Discharge Plan and Services           Expected Discharge Date: 02/03/19                                     Social Determinants of Health (SDOH) Interventions    Readmission Risk Interventions Readmission Risk Prevention Plan 01/30/2019  Transportation Screening Complete  Medication Review Press photographer) Complete  HRI or [place] Complete  SW Recovery Care/Counseling Consult Complete  Palliative Care Screening Not Applicable  Skilled Nursing Facility Complete  Some recent data might be hidden

## 2019-02-05 NOTE — Anesthesia Post-op Follow-up Note (Signed)
Anesthesia QCDR form completed.        

## 2019-02-05 NOTE — Anesthesia Preprocedure Evaluation (Signed)
Anesthesia Evaluation  Patient identified by MRN, date of birth, ID band Patient awake    Reviewed: Allergy & Precautions, NPO status , Patient's Chart, lab work & pertinent test results  History of Anesthesia Complications Negative for: history of anesthetic complications  Airway Mallampati: II  TM Distance: >3 FB    Comment: Small mouth Dental  (+) Missing, Poor Dentition   Pulmonary neg sleep apnea, neg COPD, Current Smoker, former smoker,    breath sounds clear to auscultation- rhonchi (-) wheezing      Cardiovascular hypertension, Pt. on medications + Peripheral Vascular Disease  (-) CAD, (-) Past MI and (-) Cardiac Stents  Rhythm:Regular Rate:Normal - Systolic murmurs and - Diastolic murmurs    Neuro/Psych PSYCHIATRIC DISORDERS Anxiety Depression Marked cognitive impairment - aware to person but not place or time, unable to answer questions appropriately Sciatica, chronic back pain    GI/Hepatic negative GI ROS, (+) Cirrhosis     substance abuse (on methadone therapy)  , Hepatitis -, C  Endo/Other  diabetes  Renal/GU CRFRenal disease     Musculoskeletal  (+) Arthritis , narcotic dependent  Abdominal (+) - obese,   Peds  Hematology  (+) Blood dyscrasia (Hgb 9.0), anemia ,   Anesthesia Other Findings Past Medical History: No date: Anxiety No date: Arthritis No date: Chronic pain No date: Depression No date: Drug abuse     Comment: History of polysubstance abuse; currently on               methadone. No date: Hepatitis     Comment: History of Hep "C". treated and cured with               Harvoni No date: History of chicken pox No date: Hypertension No date: Panic attacks No date: Peripheral vascular disease ([place])     Comment: stent in place.  No date: Pre-diabetes No date: UTI (urinary tract infection)     Comment: History of   Reproductive/Obstetrics                              Anesthesia Physical  Anesthesia Plan  ASA: III  Anesthesia Plan: General   Post-op Pain Management:  Regional for Post-op pain   Induction: Intravenous  PONV Risk Score and Plan: Ondansetron  Airway Management Planned: Oral ETT  Additional Equipment:   Intra-op Plan:   Post-operative Plan: Extubation in OR  Informed Consent: I have reviewed the patients History and Physical, chart, labs and discussed the procedure including the risks, benefits and alternatives for the proposed anesthesia with the patient or authorized representative who has indicated his/her understanding and acceptance.     Dental advisory given and Dental Advisory Given  Plan Discussed with: CRNA and Anesthesiologist  Anesthesia Plan Comments:         Anesthesia Quick Evaluation

## 2019-02-05 NOTE — Anesthesia Postprocedure Evaluation (Signed)
Anesthesia Post Note  Patient: Yesenia Shepard  Procedure(s) Performed: APPLICATION OF WOUND VAC POSS DEBRIDEMENT (N/A )  Patient location during evaluation: PACU Anesthesia Type: General Level of consciousness: awake and alert Pain management: pain level controlled Vital Signs Assessment: post-procedure vital signs reviewed and stable Respiratory status: spontaneous breathing, nonlabored ventilation, respiratory function stable and patient connected to nasal cannula oxygen Cardiovascular status: blood pressure returned to baseline and stable Postop Assessment: no apparent nausea or vomiting Anesthetic complications: no     Last Vitals:  Vitals:   02/05/19 1601 02/05/19 1627  BP: (!) 131/95 133/66  Pulse: 75 78  Resp: 13 15  Temp: 37.1 C 36.4 C  SpO2: 97% 100%    Last Pain:  Vitals:   02/05/19 1627  TempSrc:   PainSc: 0-No pain                 Martha Clan

## 2019-02-05 NOTE — Anesthesia Procedure Notes (Signed)
Procedure Name: Intubation Performed by: Leander Rams, CRNA Pre-anesthesia Checklist: Patient identified, Patient being monitored, Timeout performed, Emergency Drugs available and Suction available Patient Re-evaluated:Patient Re-evaluated prior to induction Oxygen Delivery Method: Circle system utilized Preoxygenation: Pre-oxygenation with 100% oxygen Induction Type: IV induction Ventilation: Mask ventilation without difficulty Laryngoscope Size: 3 and McGraph Grade View: Grade I Tube type: Oral Tube size: 6.5 mm Number of attempts: 1 Airway Equipment and Method: Stylet and Video-laryngoscopy Placement Confirmation: ETT inserted through vocal cords under direct vision,  positive ETCO2 and breath sounds checked- equal and bilateral Secured at: 19 cm Tube secured with: Tape Dental Injury: Teeth and Oropharynx as per pre-operative assessment

## 2019-02-05 NOTE — Progress Notes (Addendum)
[place] [place] Hospital Day(s): 8.   Post op day(s): 2 Days Post-Op.   Interval History: Patient seen and examined, no acute events or new complaints overnight. No acute issues. History difficult to obtain. Plan for  wound vac placement and possible further debridement in the OR today with Dr Celine Ahr.    Vital signs in last 24 hours: [min-max] current  Temp:  [97.7 F (36.5 C)-98.3 F (36.8 C)] 97.7 F (36.5 C) (07/30 0508) Pulse Rate:  [74-86] 81 (07/30 0508) Resp:  [16-19] 16 (07/30 0508) BP: (128-145)/(62-77) 145/68 (07/30 0508) SpO2:  [100 %] 100 % (07/30 0508) Weight:  [53 kg] 53 kg (07/30 0353)     Height: 5\' 6"  (167.6 cm) Weight: 53 kg BMI (Calculated): 18.87   Intake/Output last 2 shifts:  07/29 0701 - 07/30 0700 In: 360 [P.O.:360] Out: 1300 [Urine:1300]   Physical Exam:  Constitutional: alert, cooperative and no distress  Respiratory: breathing non-labored at rest  Integumentary: 12 x 12 x 2.5 cm sacral ulceration, bone palpable, no gross necrotic tissue, wound packed with wet-to-dry dressings   Labs:  CBC Latest Ref Rng & Units 02/05/2019 02/04/2019 02/03/2019  WBC 4.0 - 10.5 K/uL 10.9(H) 10.4 11.6(H)  Hemoglobin 12.0 - 15.0 g/dL 7.4(L) 7.5(L) 9.0(L)  Hematocrit 36.0 - 46.0 % 24.8(L) 25.1(L) 28.9(L)  Platelets 150 - 400 K/uL 471(H) 448(H) 505(H)   CMP Latest Ref Rng & Units 02/05/2019 02/04/2019 02/03/2019  Glucose 70 - 99 mg/dL 103(H) 131(H) 136(H)  BUN 8 - 23 mg/dL 18 20 24(H)  Creatinine 0.44 - 1.00 mg/dL 0.80 0.98 1.00  Sodium 135 - 145 mmol/L 140 142 139  Potassium 3.5 - 5.1 mmol/L 4.1 4.2 4.1  Chloride 98 - 111 mmol/L 113(H) 116(H) 112(H)  CO2 22 - 32 mmol/L 22 22 21(L)  Calcium 8.9 - 10.3 mg/dL 7.9(L) 7.8(L) 8.1(L)  Total Protein 6.5 - 8.1 g/dL - - -  Total Bilirubin 0.3 - 1.2 mg/dL - - -  Alkaline Phos 38 - 126 U/L - - -  AST 15 - 41 U/L - - -  ALT 0 - 44 U/L - - -     Imaging studies: No new pertinent imaging  studies   Assessment/Plan:  68 y.o. female with stable leukocytosis and hemoglobin otherwise without new acute issues 2 Days Post-Op s/p irrigation and debridement for infected sacral decubitus ulcer   - Will plan on wound vac placement and possible further debridement in the OR this afternoon with Dr Celine Ahr.   - Will discuss above with patient's medical DPOA and obtain informed consent.     - NPO for this morning + IVF   - Continue wet-to-dry dressing changes daily + prn; manage VAC post-op  - Continue IV ABx (Cefepime); follow up Cx               - pain control prn             - monitor leukocytosis; hgb              - further management per primary team   All of the above findings and recommendations were discussed with the patient, patient's family (medical DPOA via telephone), and the medical team, and all of patient's family's questions were answered to their expressed satisfaction.  -- Edison Simon, PA-C [facility] 02/05/2019, 7:18 AM [phone] M-F: 7am - 4pm  I saw and evaluated the patient.  I agree with the above documentation, exam, and plan,  which I have edited where appropriate. Fredirick Maudlin  3:10 PM

## 2019-02-05 NOTE — Progress Notes (Signed)
Back from surgery. Wound vac intact and therapy mode on.

## 2019-02-05 NOTE — Op Note (Signed)
Operative Note  Preoperative Diagnosis: Sacral decubitus ulcer  Postoperative Diagnosis: Same  Operation: Application of wound VAC, 132 cm (12 x 11 x 1.5 cm)  Surgeon: Fredirick Maudlin, MD  Assistant: None  Anesthesia: General endotracheal  Findings: No additional necrotic tissue was appreciated.  There was a small area of undermining for approximately 1 cm at the 1 to 2 o'clock position.  Indications: This is a 69 year old woman who underwent debridement of a sacral pressure ulcer 2 days ago.  A wound VAC was applied at that time, however skin edge bleeding prevented a good seal and she was changed back to moist dressing changes.  There has been no further oozing and a wound VAC is now indicated.  She is unable to tolerate being turned enough in her hospital bed for application of the [place].  Procedure In Detail: The patient was brought to the operating room and intubated on her hospital bed.  She was turned into the prone position, taking care to appropriately support her body and pad all bony prominences.  The existing dressing was removed.  The wound was inspected carefully.  No additional necrotic tissue was identified.  There was a small area of undermining at the 1 to 2 o'clock position.  The wound was deemed suitable for wound VAC placement.  A square of white vacuum foam was placed over the exposed sacral bone.  Standard gray granu foam was placed over this and into the undermined area.  The drape was applied and the dressing placed to 75 mmHg negative pressure suction.  No leaks were appreciated.  The patient was then turned supine back onto her hospital bed and extubated without incident.  EBL: None  IVF: See anesthesia record  Specimen(s): None  Complications: none immediately apparent.   Counts: all needles, instruments, and sponges were counted and reported to be correct in number at the end of the case.   I was present for and participated in the entire  operation.  Fredirick Maudlin  3:18 PM

## 2019-02-06 DIAGNOSIS — I10 Essential (primary) hypertension: Secondary | ICD-10-CM

## 2019-02-06 DIAGNOSIS — Z79891 Long term (current) use of opiate analgesic: Secondary | ICD-10-CM

## 2019-02-06 DIAGNOSIS — D500 Iron deficiency anemia secondary to blood loss (chronic): Secondary | ICD-10-CM

## 2019-02-06 DIAGNOSIS — F329 Major depressive disorder, single episode, unspecified: Secondary | ICD-10-CM

## 2019-02-06 DIAGNOSIS — G894 Chronic pain syndrome: Secondary | ICD-10-CM

## 2019-02-06 DIAGNOSIS — F17210 Nicotine dependence, cigarettes, uncomplicated: Secondary | ICD-10-CM

## 2019-02-06 DIAGNOSIS — L89154 Pressure ulcer of sacral region, stage 4: Secondary | ICD-10-CM

## 2019-02-06 DIAGNOSIS — M4628 Osteomyelitis of vertebra, sacral and sacrococcygeal region: Secondary | ICD-10-CM

## 2019-02-06 DIAGNOSIS — I959 Hypotension, unspecified: Secondary | ICD-10-CM

## 2019-02-06 DIAGNOSIS — Z978 Presence of other specified devices: Secondary | ICD-10-CM

## 2019-02-06 DIAGNOSIS — Z9889 Other specified postprocedural states: Secondary | ICD-10-CM

## 2019-02-06 DIAGNOSIS — I739 Peripheral vascular disease, unspecified: Secondary | ICD-10-CM

## 2019-02-06 DIAGNOSIS — Z79899 Other long term (current) drug therapy: Secondary | ICD-10-CM

## 2019-02-06 DIAGNOSIS — Z8619 Personal history of other infectious and parasitic diseases: Secondary | ICD-10-CM

## 2019-02-06 DIAGNOSIS — D72829 Elevated white blood cell count, unspecified: Secondary | ICD-10-CM

## 2019-02-06 DIAGNOSIS — F419 Anxiety disorder, unspecified: Secondary | ICD-10-CM

## 2019-02-06 LAB — BASIC METABOLIC PANEL
Anion gap: 7 (ref 5–15)
BUN: 18 mg/dL (ref 8–23)
CO2: 21 mmol/L — ABNORMAL LOW (ref 22–32)
Calcium: 7.9 mg/dL — ABNORMAL LOW (ref 8.9–10.3)
Chloride: 112 mmol/L — ABNORMAL HIGH (ref 98–111)
Creatinine, Ser: 0.85 mg/dL (ref 0.44–1.00)
GFR calc Af Amer: >60 mL/min (ref >60)
GFR calc non Af Amer: >60 mL/min (ref >60)
Glucose, Bld: 126 mg/dL — ABNORMAL HIGH (ref 70–99)
Potassium: 3.4 mmol/L — ABNORMAL LOW (ref 3.5–5.1)
Sodium: 140 mmol/L (ref 135–145)

## 2019-02-06 LAB — CBC
HCT: 24.4 % — ABNORMAL LOW (ref 36.0–46.0)
Hemoglobin: 7.4 g/dL — ABNORMAL LOW (ref 12.0–15.0)
MCH: 29.4 pg (ref 26.0–34.0)
MCHC: 30.3 g/dL (ref 30.0–36.0)
MCV: 96.8 fL (ref 80.0–100.0)
Platelets: 450 10*3/uL — ABNORMAL HIGH (ref 150–400)
RBC: 2.52 MIL/uL — ABNORMAL LOW (ref 3.87–5.11)
RDW: 15.3 % (ref 11.5–15.5)
WBC: 15.4 10*3/uL — ABNORMAL HIGH (ref 4.0–10.5)
nRBC: 0 % (ref 0.0–0.2)

## 2019-02-06 LAB — GLUCOSE, CAPILLARY
Glucose-Capillary: 94 mg/dL (ref 70–99)
Glucose-Capillary: 160 mg/dL — ABNORMAL HIGH (ref 70–99)

## 2019-02-06 LAB — PHOSPHORUS: Phosphorus: 2.4 mg/dL — ABNORMAL LOW (ref 2.5–4.6)

## 2019-02-06 MED ORDER — CLOPIDOGREL BISULFATE 75 MG PO TABS
75.0000 mg | ORAL_TABLET | Freq: Every day | ORAL | Status: DC
  Administered 2019-02-06 – 2019-02-11 (×6): 75 mg via ORAL
  Filled 2019-02-06 (×7): qty 1

## 2019-02-06 MED ORDER — POTASSIUM CHLORIDE CRYS ER 20 MEQ PO TBCR
40.0000 meq | EXTENDED_RELEASE_TABLET | Freq: Once | ORAL | Status: AC
  Administered 2019-02-06: 40 meq via ORAL
  Filled 2019-02-06: qty 2

## 2019-02-06 MED ORDER — K PHOS MONO-SOD PHOS DI & MONO 155-852-130 MG PO TABS
500.0000 mg | ORAL_TABLET | Freq: Two times a day (BID) | ORAL | Status: AC
  Administered 2019-02-06 (×2): 500 mg via ORAL
  Filled 2019-02-06 (×2): qty 2

## 2019-02-06 MED ORDER — ASPIRIN 81 MG PO CHEW
81.0000 mg | CHEWABLE_TABLET | Freq: Every day | ORAL | Status: DC
  Administered 2019-02-06 – 2019-02-11 (×6): 81 mg via ORAL
  Filled 2019-02-06 (×6): qty 1

## 2019-02-06 MED ORDER — LINEZOLID 600 MG PO TABS
600.0000 mg | ORAL_TABLET | Freq: Two times a day (BID) | ORAL | Status: DC
  Administered 2019-02-06 – 2019-02-11 (×10): 600 mg via ORAL
  Filled 2019-02-06 (×12): qty 1

## 2019-02-06 MED ORDER — ESCITALOPRAM OXALATE 10 MG PO TABS
5.0000 mg | ORAL_TABLET | Freq: Every day | ORAL | Status: DC
  Administered 2019-02-07 – 2019-02-11 (×5): 5 mg via ORAL
  Filled 2019-02-06 (×7): qty 0.5

## 2019-02-06 NOTE — TOC Progression Note (Addendum)
Transition of Care [facility]) - Progression Note    Patient Details  Name: Yesenia Shepard MRN: [id number] Date of Birth: June 04, 1951  [facility]) CM/SW Contact  Katrina Stack, RN Phone Number: 02/06/2019, 6:17 PM  Clinical Narrative:   Patient now with wound vac.  Informed by [name] at [facility] that the family is going to have to expedient an appeal. Spoke with [name] at [facility].  Record show SNF was denied "because patient looked to be at her baseline" based on clinical information that was sent earlier in the week.  At that time a peer to peer could have happened "but it did not." Unable to determine if this was communicated to [facility] CM. Presently out of time frame for a peer to peer.  In order for [facility] to start a "new auth", this current denial  must be processed to completion before a new auth can be pursued. . CM spoke with patient's sister [name]- who is HCPOA and provided her the phone numbers to call ([phone]).  [name] has made initial contact.   CM faxed the signed HCPOA documents to [facility] to show that [name] has permission to speak for her sister.  Patient is not able to navigate this process.  CM left VM for [name] at [facility] updating on the appeal process.         Expected Discharge Plan and Services           Expected Discharge Date: 02/03/19                                     Social Determinants of Health (SDOH) Interventions    Readmission Risk Interventions Readmission Risk Prevention Plan 01/30/2019  Transportation Screening Complete  Medication Review Press photographer) Complete  HRI or [place] Complete  SW Recovery Care/Counseling Consult Complete  Palliative Care Screening Not Applicable  Skilled Nursing Facility Complete  Some recent data might be hidden

## 2019-02-06 NOTE — Progress Notes (Addendum)
[place] Hospital Day(s): 9.   Post op day(s): 1 Day Post-Op.   Interval History: Patient seen and examined, no acute events or new complaints overnight. Patient resting comfortably in bed. History is difficult to obtain. Patient repeats all questions asked. No fevers. Slight increase in leukocytosis to 15K. No other acute issues. S/p wound vac placement in OR yesterday. No reported issues.   Vital signs in last 24 hours: [min-max] current  Temp:  [97.4 F (36.3 C)-98.7 F (37.1 C)] 98.1 F (36.7 C) (07/31 0558) Pulse Rate:  [73-82] 74 (07/31 0558) Resp:  [10-18] 18 (07/31 0558) BP: (100-152)/(64-95) 143/71 (07/31 0558) SpO2:  [97 %-100 %] 100 % (07/31 0558)     Height: 5\' 6"  (167.6 cm) Weight: 53 kg BMI (Calculated): 18.87   Intake/Output last 2 shifts:  07/30 0701 - 07/31 0700 In: 3132.6 [P.O.:600; I.V.:1777.6; IV Piggyback:754.9] Out: 12 [Drains:50; Blood:2]   Physical Exam:  Constitutional: alert, cooperative and no distress, resting comfortably.   Respiratory: breathing non-labored at rest  Integumentary: Wound vac to sacrum, good seal, no leaks. Serosanguinous output.    Labs:  CBC Latest Ref Rng & Units 02/06/2019 02/05/2019 02/04/2019  WBC 4.0 - 10.5 K/uL 15.4(H) 10.9(H) 10.4  Hemoglobin 12.0 - 15.0 g/dL 7.4(L) 7.4(L) 7.5(L)  Hematocrit 36.0 - 46.0 % 24.4(L) 24.8(L) 25.1(L)  Platelets 150 - 400 K/uL 450(H) 471(H) 448(H)   CMP Latest Ref Rng & Units 02/06/2019 02/05/2019 02/04/2019  Glucose 70 - 99 mg/dL 126(H) 103(H) 131(H)  BUN 8 - 23 mg/dL 18 18 20   Creatinine 0.44 - 1.00 mg/dL 0.85 0.80 0.98  Sodium 135 - 145 mmol/L 140 140 142  Potassium 3.5 - 5.1 mmol/L 3.4(L) 4.1 4.2  Chloride 98 - 111 mmol/L 112(H) 113(H) 116(H)  CO2 22 - 32 mmol/L 21(L) 22 22  Calcium 8.9 - 10.3 mg/dL 7.9(L) 7.9(L) 7.8(L)  Total Protein 6.5 - 8.1 g/dL - - -  Total Bilirubin 0.3 - 1.2 mg/dL - - -  Alkaline Phos 38 - 126 U/L - - -  AST 15 - 41 U/L -  - -  ALT 0 - 44 U/L - - -     Imaging studies: No new pertinent imaging studies   Assessment/Plan:  68 y.o. female with infected sacral decubitus ulceration 1 Day Post-Op s/p wound vac placement   - Monitor wound vac, currently on TTS schedule  - Okay for diet   - Continue IV ABx (Cefepime); follow up Cx   - Osteomyelitis on bone biopsy; recommend ID consultation to determine appropriate ABx course - pain control prn - monitor leukocytosis; hgb - further management per primary team    All of the above findings and recommendations were discussed with the medical team  -- Edison Simon, PA-C [facility] 02/06/2019, 7:36 AM [phone] M-F: 7am - 4pm  I saw and evaluated the patient.  I agree with the above documentation, exam, and plan, which I have edited where appropriate. Fredirick Maudlin  10:41 AM

## 2019-02-06 NOTE — Care Management Important Message (Signed)
Important Message  Patient Details  Name: Yesenia Shepard MRN: [id number] Date of Birth: September 03, 1950   Medicare Important Message Given:  Yes     Dannette Barbara 02/06/2019, 10:52 AM

## 2019-02-06 NOTE — Progress Notes (Signed)
Pharmacy Electrolyte Monitoring Consult:  Pharmacy consulted to assist in monitoring and replacing electrolytes in this 68 y.o. female admitted on 01/28/2019 with Hypotension   Labs:  Sodium (mmol/L)  Date Value  02/06/2019 140  02/04/2015 139   Potassium (mmol/L)  Date Value  02/06/2019 3.4 (L)   Magnesium (mg/dL)  Date Value  02/05/2019 1.8   Phosphorus (mg/dL)  Date Value  02/06/2019 2.4 (L)   Calcium (mg/dL)  Date Value  02/06/2019 7.9 (L)   Albumin (g/dL)  Date Value  01/28/2019 2.3 (L)   Pt received 15 mmol KPhos yesterday  Assessment/Plan: K 3.4, Phos 2.4, SCr 0.85 - pt has already received KCl 40 mEq PO x1 this AM Will order Neutra Phos tab 2 tab PO BID x2 doses per protocol Recheck labs in AM  Pharmacy will continue to follow.   Rocky Morel 02/06/2019 1:19 PM

## 2019-02-06 NOTE — Progress Notes (Signed)
Infectious disease called Probation officer and notified pt lab results showed active VRE and to initiate contact precautions. MD notified. Placed pt on contact precautions.

## 2019-02-06 NOTE — Progress Notes (Addendum)
[facility] [place] NAME: Yesenia Shepard    MR#:  [id number]  DATE OF BIRTH:  1951-02-16  SUBJECTIVE:  Patient complains sacral pain..  S/p wound VAC placement. REVIEW OF SYSTEMS:    Review of Systems  Constitutional: Negative for fever, chills weight loss HENT: Negative for ear pain, nosebleeds, congestion, facial swelling, rhinorrhea, neck pain, neck stiffness and ear discharge.   Respiratory: Negative for cough, shortness of breath, wheezing  Cardiovascular: Negative for chest pain, palpitations and leg swelling.  Gastrointestinal: Negative for heartburn, abdominal pain, vomiting, diarrhea or consitpation Genitourinary: Negative for dysuria, urgency, frequency, hematuria Musculoskeletal: Negative for back pain or joint pain Neurological: Negative for dizziness, seizures, syncope, focal weakness,  numbness and headaches.  Hematological: Does not bruise/bleed easily.  Psychiatric/Behavioral: Negative for hallucinations, confusion, dysphoric mood Skin: Sacral decubitus ulcer DRUG ALLERGIES:  No Known Allergies  VITALS:  Blood pressure (!) 143/71, pulse 74, temperature 98.1 F (36.7 C), temperature source Oral, resp. rate 18, height 5\' 6"  (1.676 m), weight 53 kg, SpO2 100 %.  PHYSICAL EXAMINATION:  Constitutional: Appears thin and frail no distress. HENT: Normocephalic.  Eyes: Conjunctivae and EOM are normal. PERRLA, no scleral icterus.  Neck: Normal ROM. Neck supple. No JVD. No tracheal deviation. CVS: RRR, S1/S2 +, no murmurs, no gallops, no carotid bruit.  Pulmonary: Effort and breath sounds normal, no stridor, rhonchi, wheezes, rales.  Abdominal: Soft. BS +,  no distension, tenderness, rebound or guarding.  Musculoskeletal: Normal range of motion. No edema and no tenderness.  Neuro: Alert. CN 2-12 grossly intact. No focal deficits. Skin: Stage IV/unstageable decubitus ulcer in dressing.  On wound VAC. Psychiatric: Flat affect.   LABORATORY PANEL:   CBC Recent Labs  Lab 02/06/19 0439  WBC 15.4*  HGB 7.4*  HCT 24.4*  PLT 450*   ------------------------------------------------------------------------------------------------------------------  Chemistries  Recent Labs  Lab 02/05/19 0544 02/06/19 0439  NA 140 140  K 4.1 3.4*  CL 113* 112*  CO2 22 21*  GLUCOSE 103* 126*  BUN 18 18  CREATININE 0.80 0.85  CALCIUM 7.9* 7.9*  MG 1.8  --    ------------------------------------------------------------------------------------------------------------------  Cardiac Enzymes No results for input(s): TROPONINI in the last 168 hours. ------------------------------------------------------------------------------------------------------------------  RADIOLOGY:  No results found.   ASSESSMENT AND PLAN:    68 year old female with depression who goes from [facility] due to possible infected sacral decubitus ulcer.  1.  Sepsis: Patient presented with hypotension, leukocytosis.  Sepsis is due to infected sacral decubitus ulcer The patient has been on cefepime, changed to Unasyn. Pain control. S/p sacral DU debridement.  S/P wound VAC placement. Resume Plavix.  Follow-up wound culture: VRE. Bone biopsy: acute osteomyelitis. ID consult. Dr. Delaine Lame will see the patient.  2.  Sacral decubitus ulcer: As above. Leukocytosis.  Possible due to procedure.  Follow-up CBC.  3.  Acute on chronic anemia: Anemia panel is consistent with iron deficiency.   Patient is status post 1 unit PRBC due to drop in hemoglobin.  She also received IV iron.   Hemoglobin decreased to 7.4.  Follow-up hemoglobin in a.m., PRBC transfusion PRN.  4.  Protein calorie malnutrition: Continue nutritional supplements  5.  Depression: Patient is on Lexapro and clonazepam as needed  6.  Chronic pain syndrome on chronic methadone CKD stage III.  Stable.  Acute renal failure due to dehydration.  Improved with IV fluid  support. Hypophosphatemia.  Improved with supplement. Discussed with her sister. Management plans discussed with the patient  and she is in agreement.  CODE STATUS: DNR  TOTAL TIME TAKING CARE OF THIS PATIENT: 26 minutes.   Physical therapy is recommending skilled nursing facility upon discharge.  Patient is from [facility].  POSSIBLE D/C 2 days, DEPENDING ON CLINICAL CONDITION.   [name] M.D on 02/06/2019 at 11:05 AM  Between 7am to 6pm - Pager - [phone] After 6pm go to [email or web address] - password [email or web address] [facility]  Office  [phone]  CC: Primary care physician; [name], [name], MD  Note: This dictation was prepared with Dragon dictation along with smaller phrase technology. Any transcriptional errors that result from this process are unintentional.

## 2019-02-06 NOTE — Consult Note (Signed)
NAME: Yesenia Shepard  DOB: June 05, 1951  MRN: [id number]  Date/Time: 02/06/2019 5:10 PM  REQUESTING PROVIDER Subjective:  REASON FOR CONSULT:  No history  available from patient- she says I dont know for most of the questions ? Yesenia Shepard is a 68 y.o. female with a history of PVD, HTN, anxiety, treated hepc, chronic pain syndrome was admitted from  [facility] because of  Hypotension and greenish brown discharge from sacral  Wound on 01/28/19. BP was 109/54, HR 75, Temp 98.3 WBC was 17.6, cr 1.64, she was started on vanco and cefepime after wound culture was sent. Underwent irrigation and debridement of the wound on 02/03/19 I am asked to see the patient as the bone pathology is osteomyelitis. Pt is currently on unasyn  Past Medical History:  Diagnosis Date  . Anxiety   . Arthritis   . Chronic pain   . Depression   . Diabetes mellitus without complication ([place])   . Drug abuse ([place])    History of polysubstance abuse; currently on methadone.  . Hepatitis    History of Hep "C". treated and cured with Harvoni  . History of chicken pox   . Hypertension   . Panic attacks   . Peripheral vascular disease ([place])    stent in place.   Marland Kitchen UTI (urinary tract infection)    History of    Past Surgical History:  Procedure Laterality Date  . ABDOMINAL HYSTERECTOMY  1989  . AMPUTATION TOE Left 10/26/2018   Procedure: 5TH RAY RESCTION;  Surgeon: Albertine Patricia, DPM;  Location: [facility];  Service: Podiatry;  Laterality: Left;  . APPLICATION OF WOUND VAC  02/03/2019   Procedure: APPLICATION OF WOUND VAC;  Surgeon: Fredirick Maudlin, MD;  Location: [facility];  Service: General;;  . APPLICATION OF WOUND VAC N/A 02/05/2019   Procedure: APPLICATION OF WOUND VAC POSS DEBRIDEMENT;  Surgeon: Fredirick Maudlin, MD;  Location: [facility];  Service: General;  Laterality: N/A;  . CHOLECYSTECTOMY  1987  . INTRACAPSULAR CATARACT EXTRACTION Left   . IRRIGATION AND DEBRIDEMENT ABSCESS N/A 02/03/2019   Procedure:  IRRIGATION AND DEBRIDEMENT SACRAL DECUBITUS;  Surgeon: Fredirick Maudlin, MD;  Location: [facility];  Service: General;  Laterality: N/A;  . LOWER EXTREMITY ANGIOGRAPHY Left 12/17/2017   Procedure: LOWER EXTREMITY ANGIOGRAPHY;  Surgeon: Katha Cabal, MD;  Location: [facility];  Service: Cardiovascular;  Laterality: Left;  . LOWER EXTREMITY ANGIOGRAPHY Left 10/24/2018   Procedure: LOWER EXTREMITY ANGIOGRAPHY;  Surgeon: Algernon Huxley, MD;  Location: [facility];  Service: Cardiovascular;  Laterality: Left;  . PERIPHERAL VASCULAR CATHETERIZATION Left 05/04/2015   Procedure: Lower Extremity Angiography;  Surgeon: Katha Cabal, MD;  Location: [place] [facility];  Service: Cardiovascular;  Laterality: Left;  . PERIPHERAL VASCULAR CATHETERIZATION Left 05/04/2015   Procedure: Lower Extremity Intervention;  Surgeon: Katha Cabal, MD;  Location: [place] [facility];  Service: Cardiovascular;  Laterality: Left;  . SHOULDER ARTHROSCOPY WITH OPEN ROTATOR CUFF REPAIR Right 01/16/2017   Procedure: SHOULDER ARTHROSCOPY WITH OPEN ROTATOR CUFF REPAIR;  Surgeon: Thornton Park, MD;  Location: [facility];  Service: Orthopedics;  Laterality: Right;  . TONSILLECTOMY AND ADENOIDECTOMY  1971    Social History   Socioeconomic History  . Marital status: Divorced    Spouse name: Not on file  . Number of children: Not on file  . Years of education: Not on file  . Highest education level: Not on file  Occupational History  . Not  on file  Social Needs  . Financial resource strain: Not hard at all  . Food insecurity    Worry: Never true    Inability: Never true  . Transportation needs    Medical: No    Non-medical: No  Tobacco Use  . Smoking status: Former Smoker    Packs/day: 0.50    Years: 30.00    Pack years: 15.00    Types: Cigarettes    Quit date: 07/25/2018    Years since quitting: 0.5  . Smokeless tobacco: Never Used  . Tobacco comment: Currently smoking 1/2 ppd   Substance and Sexual Activity  . Alcohol use: No    Alcohol/week: 0.0 standard drinks  . Drug use: Not Currently    Comment: Hx of.  . Sexual activity: Not Currently  Lifestyle  . Physical activity    Days per week: 0 days    Minutes per session: Not on file  . Stress: Not on file  Relationships  . Social Herbalist on phone: Not on file    Gets together: Not on file    Attends religious service: Not on file    Active member of club or organization: Not on file    Attends meetings of clubs or organizations: Not on file    Relationship status: Not on file  . Intimate partner violence    Fear of current or ex partner: No    Emotionally abused: No    Physically abused: No    Forced sexual activity: No  Other Topics Concern  . Not on file  Social History Narrative   1 daughter died in 2016/10/19    Lives at home not alone     Family History  Problem Relation Age of Onset  . Arthritis Mother   . Mental illness Mother        depression  . Diabetes Mother   . Cancer Mother        pancreatic  . Cancer Father        colon  . Heart disease Father   . Diabetes Father   . Cancer Daughter        lung   No Known Allergies   ? Current Facility-Administered Medications  Medication Dose Route Frequency Provider Last Rate Last Dose  . 0.9 %  sodium chloride infusion   Intravenous Continuous Fredirick Maudlin, MD 50 mL/hr at 02/06/19 1434    . acetaminophen (TYLENOL) tablet 650 mg  650 mg Oral Q6H PRN Fredirick Maudlin, MD       Or  . acetaminophen (TYLENOL) suppository 650 mg  650 mg Rectal Q6H PRN Fredirick Maudlin, MD      . Ampicillin-Sulbactam (UNASYN) 3 g in sodium chloride 0.9 % 100 mL IVPB  3 g Intravenous Q6H [name], MD 200 mL/hr at 02/06/19 1055 3 g at 02/06/19 1055  . aspirin chewable tablet 81 mg  81 mg Oral Daily [name], MD   81 mg at 02/06/19 1305  . bisacodyl (DULCOLAX) EC tablet 5 mg  5 mg Oral Daily PRN Fredirick Maudlin, MD   5 mg at 01/30/19 0934  .  cholecalciferol (VITAMIN D3) tablet 1,000 Units  1,000 Units Oral Daily Fredirick Maudlin, MD   1,000 Units at 02/06/19 1054  . clonazepam (KLONOPIN) disintegrating tablet 0.125 mg  0.125 mg Oral Q8H PRN Fredirick Maudlin, MD   0.125 mg at 02/04/19 1032  . clopidogrel (PLAVIX) tablet 75 mg  75 mg Oral Daily [name],  MD   75 mg at 02/06/19 1305  . docusate sodium (COLACE) capsule 100 mg  100 mg Oral BID Fredirick Maudlin, MD   100 mg at 02/06/19 1039  . docusate sodium (COLACE) capsule 200 mg  200 mg Oral Daily PRN Fredirick Maudlin, MD      . escitalopram (LEXAPRO) tablet 5 mg  5 mg Oral Daily Fredirick Maudlin, MD   5 mg at 02/06/19 1053  . famotidine (PEPCID) tablet 20 mg  20 mg Oral Daily Fredirick Maudlin, MD   20 mg at 02/06/19 1052  . feeding supplement (NEPRO CARB STEADY) liquid 237 mL  237 mL Oral TID BM Fredirick Maudlin, MD   237 mL at 02/06/19 1304  . folic acid (FOLVITE) tablet 1 mg  1 mg Oral Daily Fredirick Maudlin, MD   1 mg at 02/06/19 1038  . HYDROcodone-acetaminophen (NORCO/VICODIN) 5-325 MG per tablet 1 tablet  1 tablet Oral Q4H PRN Fredirick Maudlin, MD   1 tablet at 02/05/19 2216  . linezolid (ZYVOX) tablet 600 mg  600 mg Oral Q12H Tsosie Billing, MD      . MEDLINE mouth rinse  15 mL Mouth Rinse BID Fredirick Maudlin, MD   15 mL at 02/06/19 1055  . Melatonin TABS 5 mg  5 mg Oral QHS Fredirick Maudlin, MD   5 mg at 02/05/19 2216  . methadone (DOLOPHINE) 10 MG/ML solution 65 mg  65 mg Oral Daily Fredirick Maudlin, MD   65 mg at 02/06/19 1131  . multivitamins with iron tablet 1 tablet  1 tablet Oral Daily Fredirick Maudlin, MD   1 tablet at 02/06/19 1052  . ondansetron (ZOFRAN) tablet 4 mg  4 mg Oral Q6H PRN Fredirick Maudlin, MD       Or  . ondansetron [facility]) injection 4 mg  4 mg Intravenous Q6H PRN Fredirick Maudlin, MD   4 mg at 02/05/19 1500  . phosphorus (K PHOS NEUTRAL) tablet 500 mg  500 mg Oral BID Rocky Morel, RPH   500 mg at 02/06/19 1429  . polyethylene glycol  (MIRALAX / GLYCOLAX) packet 17 g  17 g Oral Daily PRN Fredirick Maudlin, MD      . traZODone (DESYREL) tablet 25 mg  25 mg Oral QHS PRN Fredirick Maudlin, MD   25 mg at 02/04/19 2106  . vitamin C (ASCORBIC ACID) tablet 250 mg  250 mg Oral BID Fredirick Maudlin, MD   250 mg at 02/06/19 1054     Abtx:  Anti-infectives (From admission, onward)   Start     Dose/Rate Route Frequency Ordered Stop   02/06/19 2200  linezolid (ZYVOX) tablet 600 mg    Note to Pharmacy: VRE bone   600 mg Oral Every 12 hours 02/06/19 1605     02/05/19 1800  Ampicillin-Sulbactam (UNASYN) 3 g in sodium chloride 0.9 % 100 mL IVPB     3 g 200 mL/hr over 30 Minutes Intravenous Every 6 hours 02/05/19 1553     01/30/19 1000  ceFEPIme (MAXIPIME) 2 g in sodium chloride 0.9 % 100 mL IVPB  Status:  Discontinued     2 g 200 mL/hr over 30 Minutes Intravenous Every 12 hours 01/30/19 0852 02/05/19 1554   01/30/19 0100  vancomycin (VANCOCIN) IVPB 750 mg/150 ml premix  Status:  Discontinued     750 mg 150 mL/hr over 60 Minutes Intravenous Every 36 hours 01/28/19 1327 01/30/19 0843   01/29/19 1300  ceFEPIme (MAXIPIME) 2 g in sodium chloride 0.9 % 100 mL  IVPB  Status:  Discontinued     2 g 200 mL/hr over 30 Minutes Intravenous Every 24 hours 01/28/19 1327 01/30/19 0852   01/28/19 1300  vancomycin (VANCOCIN) 1,250 mg in sodium chloride 0.9 % 250 mL IVPB     1,250 mg 166.7 mL/hr over 90 Minutes Intravenous  Once 01/28/19 1253 01/28/19 1544   01/28/19 1245  ceFEPIme (MAXIPIME) 2 g in sodium chloride 0.9 % 100 mL IVPB     2 g 200 mL/hr over 30 Minutes Intravenous  Once 01/28/19 1235 01/28/19 1340      REVIEW OF SYSTEMS: says I dont know for most questions Denies fever, chills Eyes: negative diplopia or visual changes, negative eye pain ENT: negative coryza, negative sore throat Resp: negative cough, hemoptysis, dyspnea Cards: negative for chest pain, palpitations, lower extremity edema GU: negative for frequency, dysuria and  hematuria GI: Negative for abdominal pain, diarrhea, bleeding, constipation Skin: negative for rash and pruritus Heme: negative for easy bruising and gum/nose bleeding MS: generalized body ache Neurolo:negative for headaches, dizziness, vertigo, memory problems  Psych:  anxiety, depression  Endocrine: negative for thyroid, diabetes issues Allergy/Immunology- negative for any medication or food allergies ?  Objective:  VITALS:  BP (!) 155/80   Pulse 86   Temp 98.9 F (37.2 C)   Resp 17   Ht 5\' 6"  (1.676 m)   Wt 53 kg   SpO2 98%   BMI 18.86 kg/m  PHYSICAL EXAM:  General: awake, Alert, cooperative, pale and emaciated Head: Normocephalic, without obvious abnormality, atraumatic. Eyes: ? strabismus ENT not examined Back: wound vac present Wound before debridement    Lungs:b/l air entry Heart: s1s2 Abdomen: Soft, non-tender,not distended. Bowel sounds normal. No masses Extremities: in bunny boots Skin: excessive bruising over arms Lymph:  Neurologic: Grossly non-focal Pertinent Labs Lab Results CBC    Component Value Date/Time   WBC 15.4 (H) 02/06/2019 0439   RBC 2.52 (L) 02/06/2019 0439   HGB 7.4 (L) 02/06/2019 0439   HCT 24.4 (L) 02/06/2019 0439   PLT 450 (H) 02/06/2019 0439   MCV 96.8 02/06/2019 0439   MCH 29.4 02/06/2019 0439   MCHC 30.3 02/06/2019 0439   RDW 15.3 02/06/2019 0439   LYMPHSABS 2.2 02/05/2019 0544   MONOABS 0.9 02/05/2019 0544   EOSABS 0.2 02/05/2019 0544   BASOSABS 0.0 02/05/2019 0544    CMP Latest Ref Rng & Units 02/06/2019 02/05/2019 02/04/2019  Glucose 70 - 99 mg/dL 126(H) 103(H) 131(H)  BUN 8 - 23 mg/dL 18 18 20   Creatinine 0.44 - 1.00 mg/dL 0.85 0.80 0.98  Sodium 135 - 145 mmol/L 140 140 142  Potassium 3.5 - 5.1 mmol/L 3.4(L) 4.1 4.2  Chloride 98 - 111 mmol/L 112(H) 113(H) 116(H)  CO2 22 - 32 mmol/L 21(L) 22 22  Calcium 8.9 - 10.3 mg/dL 7.9(L) 7.9(L) 7.8(L)  Total Protein 6.5 - 8.1 g/dL - - -  Total Bilirubin 0.3 - 1.2 mg/dL - - -   Alkaline Phos 38 - 126 U/L - - -  AST 15 - 41 U/L - - -  ALT 0 - 44 U/L - - -      Microbiology: Recent Results (from the past 240 hour(s))  SARS Coronavirus 2 (CEPHEID - Performed in [facility] hospital lab), Hosp Order     Status: None   Collection Time: 01/28/19 11:14 AM   Specimen: Nasopharyngeal Swab  Result Value Ref Range Status   SARS Coronavirus 2 NEGATIVE NEGATIVE Final    Comment: (NOTE) If  result is NEGATIVE SARS-CoV-2 target nucleic acids are NOT DETECTED. The SARS-CoV-2 RNA is generally detectable in upper and lower  respiratory specimens during the acute phase of infection. The lowest  concentration of SARS-CoV-2 viral copies this assay can detect is 250  copies / mL. A negative result does not preclude SARS-CoV-2 infection  and should not be used as the sole basis for treatment or other  patient management decisions.  A negative result may occur with  improper specimen collection / handling, submission of specimen other  than nasopharyngeal swab, presence of viral mutation(s) within the  areas targeted by this assay, and inadequate number of viral copies  (<250 copies / mL). A negative result must be combined with clinical  observations, patient history, and epidemiological information. If result is POSITIVE SARS-CoV-2 target nucleic acids are DETECTED. The SARS-CoV-2 RNA is generally detectable in upper and lower  respiratory specimens dur ing the acute phase of infection.  Positive  results are indicative of active infection with SARS-CoV-2.  Clinical  correlation with patient history and other diagnostic information is  necessary to determine patient infection status.  Positive results do  not rule out bacterial infection or co-infection with other viruses. If result is PRESUMPTIVE POSTIVE SARS-CoV-2 nucleic acids MAY BE PRESENT.   A presumptive positive result was obtained on the submitted specimen  and confirmed on repeat testing.  While 2019 novel  coronavirus  (SARS-CoV-2) nucleic acids may be present in the submitted sample  additional confirmatory testing may be necessary for epidemiological  and / or clinical management purposes  to differentiate between  SARS-CoV-2 and other Sarbecovirus currently known to infect humans.  If clinically indicated additional testing with an alternate test  methodology [phone]) is advised. The SARS-CoV-2 RNA is generally  detectable in upper and lower respiratory sp ecimens during the acute  phase of infection. The expected result is Negative. Fact Sheet for Patients:  StrictlyIdeas.no Fact Sheet for Healthcare Providers: BankingDealers.co.za This test is not yet approved or cleared by the [place] FDA and has been authorized for detection and/or diagnosis of SARS-CoV-2 by FDA under an Emergency Use Authorization (EUA).  This EUA will remain in effect (meaning this test can be used) for the duration of the COVID-19 declaration under Section 564(b)(1) of the Act, 21 U.S.C. section 360bbb-3(b)(1), unless the authorization is terminated or revoked sooner. Performed at [facility], [address] [place], [place] [address]   Urine culture     Status: Abnormal   Collection Time: 01/28/19 11:14 AM   Specimen: Urine, Clean Catch  Result Value Ref Range Status   Specimen Description   Final    URINE, CLEAN CATCH Performed at [facility], [address] [place], [place] [address]    Special Requests   Final    NONE Performed at [facility], [address] [place], [place] [address]    Culture MULTIPLE SPECIES PRESENT, SUGGEST RECOLLECTION (A)  Final   Report Status 01/29/2019 FINAL  Final  Blood culture (routine x 2)     Status: None   Collection Time: 01/28/19 12:20 PM   Specimen: BLOOD  Result Value Ref Range Status   Specimen Description BLOOD LEFT ANTECUBITAL  Final   Special Requests   Final     BOTTLES DRAWN AEROBIC AND ANAEROBIC Blood Culture adequate volume   Culture   Final    NO GROWTH 5 DAYS Performed at [facility], [address] [place],  [address]  Report Status 02/02/2019 FINAL  Final  Blood culture (routine x 2)     Status: None   Collection Time: 01/28/19 12:20 PM   Specimen: BLOOD  Result Value Ref Range Status   Specimen Description BLOOD RIGHT ANTECUBITAL  Final   Special Requests   Final    BOTTLES DRAWN AEROBIC AND ANAEROBIC Blood Culture adequate volume   Culture   Final    NO GROWTH 5 DAYS Performed at [facility], [address] [place], [place] [address]    Report Status 02/02/2019 FINAL  Final  MRSA PCR Screening     Status: None   Collection Time: 01/29/19  3:26 AM   Specimen: Nasopharyngeal  Result Value Ref Range Status   MRSA by PCR NEGATIVE NEGATIVE Final    Comment:        The GeneXpert MRSA Assay (FDA approved for NASAL specimens only), is one component of a comprehensive MRSA colonization surveillance program. It is not intended to diagnose MRSA infection nor to guide or monitor treatment for MRSA infections. Performed at [facility], [address] [place], [place] [address]   Aerobic/Anaerobic Culture (surgical/deep wound)     Status: None (Preliminary result)   Collection Time: 02/03/19  2:24 PM   Specimen: [facility] Bone biopsy; Tissue  Result Value Ref Range Status   Specimen Description   Final    BIOPSY Performed at [facility], [address] [place], [place] [address]    Special Requests   Final    BONE CULTURE SACRAL DECUBITUS Performed at [facility], [address] [place], [place] [address]    Gram Stain   Final    MODERATE GRAM NEGATIVE RODS MODERATE GRAM POSITIVE COCCI IN PAIRS FEW GRAM POSITIVE RODS NO WBC SEEN Performed at [facility], [place] [address], [place], [place] [address]    Culture FEW VANCOMYCIN RESISTANT ENTEROCOCCUS ISOLATED   Final   Report Status PENDING  Incomplete   Organism ID, Bacteria VANCOMYCIN RESISTANT ENTEROCOCCUS ISOLATED  Final      Susceptibility   Vancomycin resistant enterococcus isolated - MIC*    AMPICILLIN <=2 SENSITIVE Sensitive     VANCOMYCIN >=32 RESISTANT Resistant     GENTAMICIN SYNERGY RESISTANT Resistant     LINEZOLID 2 SENSITIVE Sensitive     * FEW VANCOMYCIN RESISTANT ENTEROCOCCUS ISOLATED    IMAGING RESULTS: I have personally reviewed the films ? Impression/Recommendation ?68 y.o. female with a history of PVD, HTN, anxiety, treated hepc, chronic pain syndrome was admitted from  [facility] because of  Hypotension and greenish brown discharge from sacral  Wound on 01/28/19. ? ?  Stage  IV sacral decub-with sacral osteomyelitis s/p debridement- VRE in the bone culture- will start linezolid On lexapro low dose 5mg - so Serotonin syndrome less possible- on PRN trazadone for sleep will discontinue that. Continue unasyn- Watch closely cbc while on Linezolid  PAD   AKI resolved  Leucocytosis   Anemia chronic with recent drop -both IDA and low folic acid managed by hospitalist- had received 1 unit PRBC- IV iron Folic acid low and is being replaced  Anxiety/depression- on lexapro- low dose - watch closely for serotonin syndrome because of linezolid  Chronic pain syndrome on methadone ___________________________________________________ Discussed with patient,and  requesting provider ID will follow peripherally this weekend- call if needed

## 2019-02-06 NOTE — Progress Notes (Signed)
Physical Therapy Treatment Patient Details Name: Yesenia Shepard MRN: [id number] DOB: 10/24/1950 Today's Date: 02/06/2019    History of Present Illness Pt presented to ER 7/22 from [facility] secondary to hypotension, weakness; admitted for management of UTI, infected sacral decubitus.  PMH includes L foot 5th ray resection 10/26/18.  Pt s/p I&D sacral decubitus ulcer and deep bone biopsy 02/03/19; continue at transfer PT order received post op.  Pt s/p wound vac placement 02/05/19 with continue at transfer order received.    PT Comments    Pt now with wound vac placement; no other significant changes noted.  Pt declined sitting edge of bed d/t significant pain on her "bottom"; able to logroll with min assist to L side and mod assist to R side with cueing (limited to R more than L d/t sacral/buttocks pain).  Tolerated LE ex's in bed fairly well and therapist performed B heelcord stretching d/t significant B plantarflexion contractures.  POC reviewed and remains appropriate.  Anticipate slow guarded progress with therapy.  Will continue to focus on strengthening, ROM, and progressive mobility per pt tolerance.  May benefit from LTC s/p rehab.   Follow Up Recommendations  SNF     Equipment Recommendations  (TBD at next facility)    Recommendations for Other Services       Precautions / Restrictions Precautions Precautions: Fall Precaution Comments: sacral decub with wound vac, L heel decub Restrictions Weight Bearing Restrictions: No    Mobility  Bed Mobility Overal bed mobility: Needs Assistance Bed Mobility: Rolling Rolling: Min assist;Mod assist         General bed mobility comments: minimal assist to logroll to L side; mod assist to logroll to R side; vc's for technique and to use bedrails; limited to R side more than L side d/t increased sacral/buttocks pain  Transfers                 General transfer comment: pt declined d/t being too painful  Ambulation/Gait              General Gait Details: unable to tolerate d/t pain   Stairs             Wheelchair Mobility    Modified Rankin (Stroke Patients Only)       Balance                                            Cognition Arousal/Alertness: Awake/alert Behavior During Therapy: [facility] for tasks assessed/performed                                   General Comments: Oriented to name and DOB; reports it is August 2020.  Reports she is in hospital but adamant she did not have a recent surgery (although reports multiple people have told her she has had a recent surgery).      Exercises General Exercises - Lower Extremity Ankle Circles/Pumps: AROM;Strengthening;Both;10 reps;Supine(limited ROM available) Short Arc Quad: AROM;Strengthening;Both;10 reps;Supine Heel Slides: AROM;AAROM;Strengthening;Both;10 reps;Supine Hip ABduction/ADduction: AROM;AAROM;Strengthening;Both;10 reps;Supine Straight Leg Raises: AROM;AAROM;Strengthening;Both;10 reps;Supine Other Exercises Other Exercises: 2x30 seconds B heelcord stretch (modified on L side d/t foot wound)    General Comments General comments (skin integrity, edema, etc.): wound vac in place.  Nursing cleared pt for participation in physical therapy.  Pt agreeable to PT  session.      Pertinent Vitals/Pain Pain Assessment: Faces Pain Location: buttocks Pain Descriptors / Indicators: Discomfort Pain Intervention(s): Limited activity within patient's tolerance;Monitored during session;Repositioned    Home Living                      Prior Function            PT Goals (current goals can now be found in the care plan section) Acute Rehab PT Goals Patient Stated Goal: pain control PT Goal Formulation: With patient Time For Goal Achievement: 02/18/19 Potential to Achieve Goals: Fair Progress towards PT goals: Progressing toward goals    Frequency    Min 2X/week      PT Plan Current plan remains  appropriate    Co-evaluation              AM-PAC PT "6 Clicks" Mobility   Outcome Measure  Help needed turning from your back to your side while in a flat bed without using bedrails?: A Lot Help needed moving from lying on your back to sitting on the side of a flat bed without using bedrails?: Total Help needed moving to and from a bed to a chair (including a wheelchair)?: Total Help needed standing up from a chair using your arms (e.g., wheelchair or bedside chair)?: Total Help needed to walk in hospital room?: Total Help needed climbing 3-5 steps with a railing? : Total 6 Click Score: 7    End of Session   Activity Tolerance: Patient limited by pain Patient left: in bed;with call bell/phone within reach;with bed alarm set;Other (comment)(B prevalon boots in place; bed in lowest position) Nurse Communication: Mobility status;Precautions;Other (comment)(pt's pain) PT Visit Diagnosis: Muscle weakness (generalized) (M62.81);Difficulty in walking, not elsewhere classified (R26.2);Pain     Time: 1319-1350 PT Time Calculation (min) (ACUTE ONLY): 31 min  Charges:  $Therapeutic Exercise: 8-22 mins $Therapeutic Activity: 8-22 mins                    Leitha Bleak, PT 02/06/19, 3:35 PM [phone]

## 2019-02-07 LAB — PHOSPHORUS: Phosphorus: 2.4 mg/dL — ABNORMAL LOW (ref 2.5–4.6)

## 2019-02-07 LAB — CBC
HCT: 25.2 % — ABNORMAL LOW (ref 36.0–46.0)
Hemoglobin: 7.7 g/dL — ABNORMAL LOW (ref 12.0–15.0)
MCH: 29.3 pg (ref 26.0–34.0)
MCHC: 30.6 g/dL (ref 30.0–36.0)
MCV: 95.8 fL (ref 80.0–100.0)
Platelets: 435 10*3/uL — ABNORMAL HIGH (ref 150–400)
RBC: 2.63 MIL/uL — ABNORMAL LOW (ref 3.87–5.11)
RDW: 15.8 % — ABNORMAL HIGH (ref 11.5–15.5)
WBC: 11.4 10*3/uL — ABNORMAL HIGH (ref 4.0–10.5)
nRBC: 0 % (ref 0.0–0.2)

## 2019-02-07 LAB — BASIC METABOLIC PANEL
Anion gap: 6 (ref 5–15)
BUN: 17 mg/dL (ref 8–23)
CO2: 25 mmol/L (ref 22–32)
Calcium: 7.8 mg/dL — ABNORMAL LOW (ref 8.9–10.3)
Chloride: 111 mmol/L (ref 98–111)
Creatinine, Ser: 0.85 mg/dL (ref 0.44–1.00)
GFR calc Af Amer: >60 mL/min (ref >60)
GFR calc non Af Amer: >60 mL/min (ref >60)
Glucose, Bld: 130 mg/dL — ABNORMAL HIGH (ref 70–99)
Potassium: 3.7 mmol/L (ref 3.5–5.1)
Sodium: 142 mmol/L (ref 135–145)

## 2019-02-07 LAB — GLUCOSE, CAPILLARY
Glucose-Capillary: 133 mg/dL — ABNORMAL HIGH (ref 70–99)
Glucose-Capillary: 147 mg/dL — ABNORMAL HIGH (ref 70–99)

## 2019-02-07 LAB — MAGNESIUM: Magnesium: 1.8 mg/dL (ref 1.7–2.4)

## 2019-02-07 MED ORDER — K PHOS MONO-SOD PHOS DI & MONO 155-852-130 MG PO TABS
500.0000 mg | ORAL_TABLET | Freq: Two times a day (BID) | ORAL | Status: AC
  Administered 2019-02-07 (×2): 500 mg via ORAL
  Filled 2019-02-07 (×2): qty 2

## 2019-02-07 MED ORDER — MAGNESIUM SULFATE 2 GM/50ML IV SOLN
2.0000 g | Freq: Once | INTRAVENOUS | Status: AC
  Administered 2019-02-07: 2 g via INTRAVENOUS
  Filled 2019-02-07: qty 50

## 2019-02-07 NOTE — Progress Notes (Signed)
[facility] Day(s): 10.   Post op day(s): 2 Days Post-Op.   Interval History: Patient seen and examined, no acute events or new complaints overnight. Patient reports having multiple pains in her body.  There has been no issues in the last 24 hours.  There is no identifiable alleviating or aggravating factors.  Vital signs in last 24 hours: [min-max] current  Temp:  [97.6 F (36.4 C)-98.9 F (37.2 C)] 97.6 F (36.4 C) (08/01 0624) Pulse Rate:  [72-86] 74 (08/01 0624) Resp:  [16-20] 16 (08/01 0624) BP: (134-155)/(64-80) 134/66 (08/01 0624) SpO2:  [98 %-99 %] 99 % (08/01 0624)     Height: 5\' 6"  (167.6 cm) Weight: 53 kg BMI (Calculated): 18.87    Physical Exam:  Constitutional: alert, cooperative and no distress  Respiratory: breathing non-labored at rest  Cardiovascular: regular rate and sinus rhythm  Gastrointestinal: soft, non-tender, and non-distended Back: There is a large sacral ulcer with adequate granulation tissue, there is no necrotic tissue.  There is no purulent drainage.  Wound VAC dressing change by me today.      Labs:  CBC Latest Ref Rng & Units 02/07/2019 02/06/2019 02/05/2019  WBC 4.0 - 10.5 K/uL 11.4(H) 15.4(H) 10.9(H)  Hemoglobin 12.0 - 15.0 g/dL 7.7(L) 7.4(L) 7.4(L)  Hematocrit 36.0 - 46.0 % 25.2(L) 24.4(L) 24.8(L)  Platelets 150 - 400 K/uL 435(H) 450(H) 471(H)   CMP Latest Ref Rng & Units 02/07/2019 02/06/2019 02/05/2019  Glucose 70 - 99 mg/dL 130(H) 126(H) 103(H)  BUN 8 - 23 mg/dL 17 18 18   Creatinine 0.44 - 1.00 mg/dL 0.85 0.85 0.80  Sodium 135 - 145 mmol/L 142 140 140  Potassium 3.5 - 5.1 mmol/L 3.7 3.4(L) 4.1  Chloride 98 - 111 mmol/L 111 112(H) 113(H)  CO2 22 - 32 mmol/L 25 21(L) 22  Calcium 8.9 - 10.3 mg/dL 7.8(L) 7.9(L) 7.9(L)  Total Protein 6.5 - 8.1 g/dL - - -  Total Bilirubin 0.3 - 1.2 mg/dL - - -  Alkaline Phos 38 - 126 U/L - - -  AST 15 - 41 U/L - - -  ALT 0 - 44 U/L - - -    Imaging studies: No new pertinent imaging  studies   Assessment/Plan:  68 y.o. female with sacral ulcer s/p cleansing and debridement with negative pressure dressing placement.   Patient with stage IV sacral ulcer with antibiotics.  She tolerated the wound VAC dressing change today.  There is adequate granulation tissue.  Decreasing white blood cell count.  We will continue with same plan of continued wound VAC changes 3 times per week.  Agree with ID service to continue antibiotic therapy.  We will continue to follow to help with wound care.  From surgery standpoint if discharge planning to start patient will need at least home health service for wound VAC changes.  Arnold Long, MD

## 2019-02-07 NOTE — Progress Notes (Signed)
Pharmacy Electrolyte Monitoring Consult:  Pharmacy consulted to assist in monitoring and replacing electrolytes in this 68 y.o. female admitted on 01/28/2019 with Hypotension   Labs:  Sodium (mmol/L)  Date Value  02/07/2019 142  02/04/2015 139   Potassium (mmol/L)  Date Value  02/07/2019 3.7   Magnesium (mg/dL)  Date Value  02/07/2019 1.8   Phosphorus (mg/dL)  Date Value  02/07/2019 2.4 (L)   Calcium (mg/dL)  Date Value  02/07/2019 7.8 (L)   Albumin (g/dL)  Date Value  01/28/2019 2.3 (L)   Pt received 15 mmol KPhos yesterday  Assessment/Plan: K 3.7, Phos 2.4, Mg 1.8 SCr 0.85. pt received kphos on 7/31 as well.   Will order Neutra Phos tab 2 tab PO BID x2 doses per protocol. Will also order Mg 2 g x 1 IV.  Recheck labs in AM  Pharmacy will continue to follow.   Oswald Hillock, PharmD, BCPS 02/07/2019 7:37 AM

## 2019-02-07 NOTE — Progress Notes (Signed)
[facility] [place] NAME: Jaielle Dlouhy    MR#:  [id number]  DATE OF BIRTH:  04/18/1951  SUBJECTIVE:  CHIEF COMPLAINT:   Chief Complaint  Patient presents with  . Hypotension   No new complaint this morning.  No fevers.  Wound dressing with wound VAC change done by surgeon at bedside this morning.  REVIEW OF SYSTEMS:  Review of Systems  Constitutional: Negative for chills and fever.  HENT: Negative for hearing loss and tinnitus.   Eyes: Negative for blurred vision and double vision.  Respiratory: Negative for cough and hemoptysis.   Cardiovascular: Negative for chest pain and palpitations.  Gastrointestinal: Negative for heartburn and nausea.  Genitourinary: Negative for dysuria and urgency.  Musculoskeletal: Negative for myalgias and neck pain.       Pain at the sacrum following dressing change today.  Skin: Negative for itching and rash.  Neurological: Negative for dizziness and headaches.  Psychiatric/Behavioral: Negative for depression and substance abuse.    DRUG ALLERGIES:  No Known Allergies VITALS:  Blood pressure 134/77, pulse 71, temperature 97.8 F (36.6 C), temperature source Oral, resp. rate 17, height 5\' 6"  (1.676 m), weight 53 kg, SpO2 98 %. PHYSICAL EXAMINATION:  Physical Exam   Constitutional: Appears thin and frail no distress. HENT: Normocephalic.  Eyes: Conjunctivae and EOM are normal. PERRLA, no scleral icterus.  Neck: Normal ROM. Neck supple. No JVD. No tracheal deviation. CVS: RRR, S1/S2 +, no murmurs, no gallops, no carotid bruit.  Pulmonary: Effort and breath sounds normal, no stridor, rhonchi, wheezes, rales.  Abdominal: Soft. BS +,  no distension, tenderness, rebound or guarding.  Musculoskeletal: Normal range of motion. No edema and no tenderness.  Neuro: Alert and oriented. No focal deficits. Skin: Stage IV sacral decub ulcer with wound VAC in place. Marland Kitchen Psychiatric: Flat affect.  LABORATORY PANEL:   Female CBC Recent Labs  Lab 02/07/19 0500  WBC 11.4*  HGB 7.7*  HCT 25.2*  PLT 435*   ------------------------------------------------------------------------------------------------------------------ Chemistries  Recent Labs  Lab 02/07/19 0500  NA 142  K 3.7  CL 111  CO2 25  GLUCOSE 130*  BUN 17  CREATININE 0.85  CALCIUM 7.8*  MG 1.8   RADIOLOGY:  No results found. ASSESSMENT AND PLAN:   68 year old female with depression who goes from [facility] due to possible infected sacral decubitus ulcer.  1.  Sepsis: Patient presented with hypotension, leukocytosis.  Sepsis is due to infected sacral decubitus ulcer The patient has been on cefepime, changed to Unasyn.  Patient also on linezolid.  On low-dose of Lexapro.  To watch for possibility of serotonin syndrome closely. Pain control. S/p sacral DU debridement.  S/P wound VAC placement. Resume Plavix. Surgeon following.  Reviewed wound VAC dressing change today.  Plan is to continue wound VAC changes 3 times weekly.  Continue antibiotics.  Patient will need home health services with wound VAC changes post discharge from the hospital. Infectious disease specialist following  2.  Sacral decubitus ulcer: As above. Wound VAC changed by surgeon today  3.  Acute on chronic anemia: Anemia panel is consistent with iron deficiency.   Patient is status post 1 unit PRBC due to drop in hemoglobin.  She also received IV iron.  .  Hemoglobin stable at 7.7 today.  4.  Protein calorie malnutrition: Continue nutritional supplements  5.  Depression: Patient is on Lexapro and clonazepam as needed  6.  Chronic pain syndrome on chronic methadone CKD stage  III.  Stable.  7 Acute renal failure due to dehydration.  Improved with IV fluid support.  8. Hypophosphatemia.  Replaced  DVT prophylaxis; SCDs Heparin Protonix being avoided due to anemia requiring packed red blood cell transfusion.  All the records are reviewed and case  discussed with Care Management/Social Worker. Management plans discussed with the patient, family and they are in agreement.  CODE STATUS: DNR  TOTAL TIME TAKING CARE OF THIS PATIENT: 35 minutes.   More than 50% of the time was spent in counseling/coordination of care: YES  POSSIBLE D/C IN 2 DAYS, DEPENDING ON CLINICAL CONDITION.   Brenya Taulbee M.D on 02/07/2019 at 4:01 PM  Between 7am to 6pm - Pager - [phone]  After 6pm go to [email or web address] - Proofreader  [facility]  Office  [phone]  CC: Primary care physician; [name], [name], MD  Note: This dictation was prepared with Dragon dictation along with smaller phrase technology. Any transcriptional errors that result from this process are unintentional.

## 2019-02-08 LAB — BPAM RBC
Blood Product Expiration Date: 202008092359
Unit Type and Rh: 600

## 2019-02-08 LAB — MAGNESIUM: Magnesium: 2.2 mg/dL (ref 1.7–2.4)

## 2019-02-08 LAB — CBC
HCT: 25.3 % — ABNORMAL LOW (ref 36.0–46.0)
Hemoglobin: 7.8 g/dL — ABNORMAL LOW (ref 12.0–15.0)
MCH: 29.3 pg (ref 26.0–34.0)
MCHC: 30.8 g/dL (ref 30.0–36.0)
MCV: 95.1 fL (ref 80.0–100.0)
Platelets: 494 10*3/uL — ABNORMAL HIGH (ref 150–400)
RBC: 2.66 MIL/uL — ABNORMAL LOW (ref 3.87–5.11)
RDW: 15.8 % — ABNORMAL HIGH (ref 11.5–15.5)
WBC: 11.2 10*3/uL — ABNORMAL HIGH (ref 4.0–10.5)
nRBC: 0 % (ref 0.0–0.2)

## 2019-02-08 LAB — TYPE AND SCREEN
ABO/RH(D): A POS
Antibody Screen: NEGATIVE
Unit division: 0

## 2019-02-08 LAB — BASIC METABOLIC PANEL
Anion gap: 7 (ref 5–15)
BUN: 13 mg/dL (ref 8–23)
CO2: 25 mmol/L (ref 22–32)
Calcium: 7.8 mg/dL — ABNORMAL LOW (ref 8.9–10.3)
Chloride: 110 mmol/L (ref 98–111)
Creatinine, Ser: 0.72 mg/dL (ref 0.44–1.00)
GFR calc Af Amer: >60 mL/min (ref >60)
GFR calc non Af Amer: >60 mL/min (ref >60)
Glucose, Bld: 114 mg/dL — ABNORMAL HIGH (ref 70–99)
Potassium: 3.2 mmol/L — ABNORMAL LOW (ref 3.5–5.1)
Sodium: 142 mmol/L (ref 135–145)

## 2019-02-08 LAB — PHOSPHORUS: Phosphorus: 2.7 mg/dL (ref 2.5–4.6)

## 2019-02-08 LAB — PREPARE RBC (CROSSMATCH)

## 2019-02-08 MED ORDER — POTASSIUM CHLORIDE CRYS ER 20 MEQ PO TBCR
40.0000 meq | EXTENDED_RELEASE_TABLET | Freq: Once | ORAL | Status: AC
  Administered 2019-02-08: 11:00:00 40 meq via ORAL
  Filled 2019-02-08: qty 2

## 2019-02-08 NOTE — Progress Notes (Signed)
Pharmacy Electrolyte Monitoring Consult:  Pharmacy consulted to assist in monitoring and replacing electrolytes in this 68 y.o. female admitted on 01/28/2019 with Hypotension   Labs:  Sodium (mmol/L)  Date Value  02/08/2019 142  02/04/2015 139   Potassium (mmol/L)  Date Value  02/08/2019 3.2 (L)   Magnesium (mg/dL)  Date Value  02/08/2019 2.2   Phosphorus (mg/dL)  Date Value  02/08/2019 2.7   Calcium (mg/dL)  Date Value  02/08/2019 7.8 (L)   Albumin (g/dL)  Date Value  01/28/2019 2.3 (L)    Assessment/Plan: K 3.2, Phos 2.7, Mg 2.2.   Will order KCl 40 mEq x 1 PO.   Recheck labs in AM  Pharmacy will continue to follow.   Oswald Hillock, PharmD, BCPS 02/08/2019 7:33 AM

## 2019-02-08 NOTE — Progress Notes (Signed)
[facility] [place] NAME: Yesenia Shepard    MR#:  [id number]  DATE OF BIRTH:  December 21, 1950  SUBJECTIVE:  CHIEF COMPLAINT:   Chief Complaint  Patient presents with  . Hypotension   No new complaint this morning.  No fevers.  Wound dressing with wound VAC change done by surgeon at bedside yesterday   REVIEW OF SYSTEMS:  Review of Systems  Constitutional: Negative for chills and fever.  HENT: Negative for hearing loss and tinnitus.   Eyes: Negative for blurred vision and double vision.  Respiratory: Negative for cough and hemoptysis.   Cardiovascular: Negative for chest pain and palpitations.  Gastrointestinal: Negative for heartburn and nausea.  Genitourinary: Negative for dysuria and urgency.  Musculoskeletal: Negative for myalgias and neck pain.       Pain at the sacrum following dressing change today.  Skin: Negative for itching and rash.  Neurological: Negative for dizziness and headaches.  Psychiatric/Behavioral: Negative for depression and substance abuse.    DRUG ALLERGIES:  No Known Allergies VITALS:  Blood pressure (!) 152/80, pulse 82, temperature 98.2 F (36.8 C), temperature source Oral, resp. rate 16, height 5\' 6"  (1.676 m), weight 53 kg, SpO2 97 %. PHYSICAL EXAMINATION:  Physical Exam   Constitutional: Appears thin and frail no distress. HENT: Normocephalic.  Eyes: Conjunctivae and EOM are normal. PERRLA, no scleral icterus.  Neck: Normal ROM. Neck supple. No JVD. No tracheal deviation. CVS: RRR, S1/S2 +, no murmurs, no gallops, no carotid bruit.  Pulmonary: Effort and breath sounds normal, no stridor, rhonchi, wheezes, rales.  Abdominal: Soft. BS +,  no distension, tenderness, rebound or guarding.  Musculoskeletal: Normal range of motion. No edema and no tenderness.  Neuro: Alert and oriented. No focal deficits. Skin: Stage IV sacral decub ulcer with wound VAC in place. Marland Kitchen Psychiatric: Flat affect.  LABORATORY  PANEL:  Female CBC Recent Labs  Lab 02/08/19 0436  WBC 11.2*  HGB 7.8*  HCT 25.3*  PLT 494*   ------------------------------------------------------------------------------------------------------------------ Chemistries  Recent Labs  Lab 02/08/19 0436  NA 142  K 3.2*  CL 110  CO2 25  GLUCOSE 114*  BUN 13  CREATININE 0.72  CALCIUM 7.8*  MG 2.2   RADIOLOGY:  No results found. ASSESSMENT AND PLAN:   68 year old female with depression who goes from [facility] due to possible infected sacral decubitus ulcer.  1.  Sepsis: Patient presented with hypotension, leukocytosis.  Sepsis is due to infected sacral decubitus ulcer The patient has been on cefepime, changed to Unasyn.  Patient also on linezolid due to VRE in culture..  On low-dose of Lexapro.  To watch for possibility of serotonin syndrome closely. Pain control. S/p sacral DU debridement.  S/P wound VAC placement. Resume Plavix. Surgeon following.  Wound VAC dressing change done at bedside by surgeon on 02/07/2019.  Plan is to continue wound VAC changes 3 times weekly.  Continue antibiotics.  Patient will need home health services with wound VAC changes post discharge from the hospital. Infectious disease specialist following.  Will discuss antibiotics treatment and duration of treatment with infectious disease specialist in a.m.  2.  Sacral decubitus ulcer: As above. Wound VAC changed by surgeon today  3.  Acute on chronic anemia: Anemia panel is consistent with iron deficiency.   Patient is status post 1 unit PRBC due to drop in hemoglobin.  She also received IV iron.  .  Hemoglobin stable at 7.8 today.  4.  Protein calorie malnutrition:  Continue nutritional supplements  5.  Depression: Patient is on Lexapro and clonazepam as needed  6.  Chronic pain syndrome on chronic methadone CKD stage III.  Stable.  7 Acute renal failure due to dehydration.  Improved with IV fluid support.  8. Hypophosphatemia.   Replaced  DVT prophylaxis; SCDs Heparin Protonix being avoided due to anemia requiring packed red blood cell transfusion.  All the records are reviewed and case discussed with Care Management/Social Worker. Management plans discussed with the patient, family and they are in agreement.  CODE STATUS: DNR  TOTAL TIME TAKING CARE OF THIS PATIENT: 74minutes.   More than 50% of the time was spent in counseling/coordination of care: YES  POSSIBLE D/C IN 2 DAYS, DEPENDING ON CLINICAL CONDITION.   Dameir Gentzler M.D on 02/08/2019 at 1:35 PM  Between 7am to 6pm - Pager - [phone]  After 6pm go to [email or web address] - Proofreader  [facility]  Office  [phone]  CC: Primary care physician; [name], [name], MD  Note: This dictation was prepared with Dragon dictation along with smaller phrase technology. Any transcriptional errors that result from this process are unintentional.

## 2019-02-08 NOTE — Progress Notes (Signed)
02/08/2019  Subjective: Patient is 3 Days Post-Op s/p debridement of sacral decub wound.  Wound vac changed yesterday.  Reports having pain in her sacral area.  Vital signs: Temp:  [97.8 F (36.6 C)-98.6 F (37 C)] 98.2 F (36.8 C) (08/02 0429) Pulse Rate:  [71-82] 82 (08/02 0429) Resp:  [16-18] 16 (08/02 0429) BP: (134-152)/(71-80) 152/80 (08/02 0429) SpO2:  [97 %-98 %] 97 % (08/02 0429)   Intake/Output: 08/01 0701 - 08/02 0700 In: 0  Out: 125 [Drains:125] Last BM Date: 02/08/19  Physical Exam: Constitutional: No acute distress Skin:  Wound vac in place with good seal.  Patient had soiled herself but had not gotten into the wound.  Surrounding skin without erythema.  Wound vac not changed today.  Labs:  Recent Labs    02/07/19 0500 02/08/19 0436  WBC 11.4* 11.2*  HGB 7.7* 7.8*  HCT 25.2* 25.3*  PLT 435* 494*   Recent Labs    02/07/19 0500 02/08/19 0436  NA 142 142  K 3.7 3.2*  CL 111 110  CO2 25 25  GLUCOSE 130* 114*  BUN 17 13  CREATININE 0.85 0.72  CALCIUM 7.8* 7.8*   No results for input(s): LABPROT, INR in the last 72 hours.  Imaging: No results found.  Assessment/Plan: This is a 68 y.o. female s/p debridement of sacral wound.  --continue wound vac in place.  Anticipate vac change either Monday for MWF changes or Tuesday for T/Th/Sat changes.  May need to change sooner if any leaks occur given location of the wound. --medical management per primary team.   Melvyn Neth, MD  [facility]

## 2019-02-09 LAB — BASIC METABOLIC PANEL
Anion gap: 4 — ABNORMAL LOW (ref 5–15)
BUN: 14 mg/dL (ref 8–23)
CO2: 27 mmol/L (ref 22–32)
Calcium: 7.5 mg/dL — ABNORMAL LOW (ref 8.9–10.3)
Chloride: 111 mmol/L (ref 98–111)
Creatinine, Ser: 0.92 mg/dL (ref 0.44–1.00)
GFR calc Af Amer: >60 mL/min (ref >60)
GFR calc non Af Amer: >60 mL/min (ref >60)
Glucose, Bld: 109 mg/dL — ABNORMAL HIGH (ref 70–99)
Potassium: 3.5 mmol/L (ref 3.5–5.1)
Sodium: 142 mmol/L (ref 135–145)

## 2019-02-09 LAB — GLUCOSE, CAPILLARY: Glucose-Capillary: 108 mg/dL — ABNORMAL HIGH (ref 70–99)

## 2019-02-09 LAB — MAGNESIUM: Magnesium: 2 mg/dL (ref 1.7–2.4)

## 2019-02-09 LAB — PHOSPHORUS: Phosphorus: 2.8 mg/dL (ref 2.5–4.6)

## 2019-02-09 MED ORDER — ENSURE ENLIVE PO LIQD
237.0000 mL | Freq: Two times a day (BID) | ORAL | Status: DC
  Administered 2019-02-09 – 2019-02-10 (×3): 237 mL via ORAL

## 2019-02-09 NOTE — Progress Notes (Signed)
Pharmacy Electrolyte Monitoring Consult:  Pharmacy consulted to assist in monitoring and replacing electrolytes in this 68 y.o. female admitted on 01/28/2019 with Hypotension   Labs:  Sodium (mmol/L)  Date Value  02/09/2019 142  02/04/2015 139   Potassium (mmol/L)  Date Value  02/09/2019 3.5   Magnesium (mg/dL)  Date Value  02/09/2019 2.0   Phosphorus (mg/dL)  Date Value  02/09/2019 2.8   Calcium (mg/dL)  Date Value  02/09/2019 7.5 (L)   Albumin (g/dL)  Date Value  01/28/2019 2.3 (L)   Corrected Ca: 8.86 mg/dL  Assessment/Plan:   No electrolyte supplementation required    Recheck labs in AM   Pharmacy will continue to follow.   Dallie Piles, PharmD 02/09/2019 7:43 AM

## 2019-02-09 NOTE — Clinical Social Work Note (Addendum)
Left voicemail for SNF admissions coordinator to check status of insurance denial appeal. Will also have her order wound vac when she calls back.  Dayton Scrape, [place] [phone]  2:18 pm: [employer]'s appeals dept is requesting additional information. CSW faxed requested documents.  Dayton Scrape, [place]

## 2019-02-09 NOTE — Progress Notes (Signed)
Nutrition Follow-up  DOCUMENTATION CODES:   Not applicable  INTERVENTION:   Ensure Enlive po BID, each supplement provides 350 kcal and 20 grams of protein  Continue MVI daily  Continue vitamin C 250 mg BID  Bowel regimen as needed per MD  NUTRITION DIAGNOSIS:   Increased nutrient needs related to wound healing as evidenced by estimated needs.  Ongoing.  GOAL:   Patient will meet greater than or equal to 90% of their needs  Progressing.  MONITOR:   PO intake, Supplement acceptance, Labs, Weight trends, Skin, I & O's  ASSESSMENT:   68 year old female patient with multiple medical problems of hepatitis C, chronic pain syndrome, anxiety, essential hypertension brought in from [facility] because of hypotension, infected decubitus ulcers in the sacrum.   Pt with improved appetite and oral intake; pt eating most of her meals and drinking some Ensure. Recommend continue supplements and vitamins after discharge until wound healing complete. Last VAC change 8/1; per MD note, wound with good granulation tissue. Per chart, pt is weight stable since admit. Pt noted to have type 2 BM on 8/2; recommend bowel regimen as needed per MD.   Medications reviewed and include: aspirin, D3, plavix, pepcid, folic acid, methadone, MVI, Vit C, NaCl @50ml /hr, unasyn    Labs reviewed: K 3.5 wnl, P 2.8 wnl, Mg 2.0 wnl Wbc 11.2(H), Hgb 7.8(L), Hct 25.3(L)  Diet Order:   Diet Order            DIET DYS 3 Room service appropriate? Yes; Fluid consistency: Thin  Diet effective now        Diet - low sodium heart healthy             EDUCATION NEEDS:   No education needs have been identified at this time  Skin:  Skin Assessment: Skin Integrity Issues:(sacral pressure injury (12cm x 12cm x 2.5cm) with wound VAC in place; stg II left foot)  Last BM:  8/2- type 2  Height:   Ht Readings from Last 1 Encounters:  01/28/19 5\' 6"  (1.676 m)   Weight:   Wt Readings from Last 1 Encounters:   02/05/19 53 kg   Ideal Body Weight:  59 kg  BMI:  Body mass index is 18.86 kg/m.  Estimated Nutritional Needs:   Kcal:  0321-2248  Protein:  85-95 grams  Fluid:  >1.4L/day  Koleen Distance MS, RD, LDN Pager #- [phone] Office#- [phone] After Hours Pager: [phone]

## 2019-02-09 NOTE — Evaluation (Signed)
Occupational Therapy Evaluation Patient Details Name: Yesenia Shepard MRN: [id number] DOB: August 25, 1950 Today's Date: 02/09/2019    History of Present Illness Pt presented to ER 7/22 from STR secondary to hypotension, weakness; admitted for management of UTI, infected sacral decubitus.  PMH includes L foot 5th ray resection 10/26/18.  Pt s/p I&D sacral decubitus ulcer and deep bone biopsy 02/03/19; continue at transfer PT order received post op.  Pt s/p wound vac placement 02/05/19 with continue at transfer order received.   Clinical Impression   Pt seen for OT evaluation this date. Prior to hospital admission, pt was at a SNF and unable to ambulate for several months. Pt with significant bilat PF contractures and demonstrates impairments in strength, activity tolerance, and pain (sacral ulcer) requiring Max A for bed level toileting, dressing, and bathing. Min-Mod A for rolling in bed for pericare. Pt incontinent of bowel and bladder. Nurse tech assisted with cleanup. Pt able to support herself in sidelying with UE support on bed rail. Wound vac seal noted to be broken 2/2 stool on wound vac bandage. RN notified. Pt would benefit from skilled OT to address noted impairments and functional limitations (see below for any additional details) in order to maximize safety and independence while minimizing falls risk and caregiver burden.  Upon hospital discharge, recommend pt discharge to SNF to continue OT.    Follow Up Recommendations  SNF    Equipment Recommendations  3 in 1 bedside commode    Recommendations for Other Services       Precautions / Restrictions Precautions Precautions: Fall Precaution Comments: sacral decub with wound vac, L heel decub, prevalon boots to B feet Restrictions Weight Bearing Restrictions: No      Mobility Bed Mobility Overal bed mobility: Needs Assistance Bed Mobility: Rolling Rolling: Min assist;Mod assist        General bed mobility comments: min-mod A to  roll and able to support self in sidelying while pericare was performed  Transfers                 General transfer comment: unable to tolerate    Balance Overall balance assessment: Needs assistance                                         ADL either performed or assessed with clinical judgement   ADL                                         General ADL Comments: Max A for toileting, bathing, and dressing at bed level; set up for self feeding and light grooming tasks     Vision Baseline Vision/History: No visual deficits Patient Visual Report: No change from baseline Vision Assessment?: No apparent visual deficits     Perception     Praxis      Pertinent Vitals/Pain Pain Assessment: Faces Faces Pain Scale: Hurts even more Pain Location: buttocks with log roll for toileting Pain Descriptors / Indicators: Discomfort Pain Intervention(s): Limited activity within patient's tolerance;Monitored during session;Repositioned     Hand Dominance Right   Extremity/Trunk Assessment Upper Extremity Assessment Upper Extremity Assessment: Generalized weakness   Lower Extremity Assessment Lower Extremity Assessment: Generalized weakness(grossly 3-/5 B LE's; significant B ankle PF contractures)       Communication Communication Communication:  No difficulties   Cognition Arousal/Alertness: Awake/alert Behavior During Therapy: Anxious Overall Cognitive Status: Within Functional Limits for tasks assessed                                     General Comments  RN notified that wound vac seal appears to be broken, stool on the sticky part of the bandage    Exercises Other Exercises: pt educated in benefits of bed mobility for pressure relief, skin integrity, and to support improved activity tolerance and strength Other Exercises: bed level toileting and bedding change after pt incontinent of urine (purewick displaced with pt's  BLE movement) and stool   Shoulder Instructions      Home Living Family/patient expects to be discharged to:: Skilled nursing facility                                        Prior Functioning/Environment Level of Independence: Needs assistance        Comments: At baseline, ambulatory with RW; reports non-ambulatory for "several months" since LE wound issues        OT Problem List: Decreased strength;Pain;Decreased range of motion;Decreased activity tolerance;Decreased knowledge of use of DME or AE      OT Treatment/Interventions: Self-care/ADL training;Therapeutic exercise;Therapeutic activities;DME and/or AE instruction;Patient/family education;Balance training    OT Goals(Current goals can be found in the care plan section) Acute Rehab OT Goals Patient Stated Goal: have less pain and eat dinner OT Goal Formulation: With patient Time For Goal Achievement: 02/23/19 Potential to Achieve Goals: Good ADL Goals Pt Will Perform Grooming: sitting;with set-up;with min guard assist Pt Will Perform Upper Body Dressing: with set-up;with min guard assist;sitting Additional ADL Goal #1: Pt will perform bed mobility with Min A in preparation for seated ADL tasks.  OT Frequency: Min 1X/week   Barriers to D/C:            Co-evaluation              AM-PAC OT "6 Clicks" Daily Activity     Outcome Measure Help from another person eating meals?: A Little Help from another person taking care of personal grooming?: A Little Help from another person toileting, which includes using toliet, bedpan, or urinal?: Total Help from another person bathing (including washing, rinsing, drying)?: A Lot Help from another person to put on and taking off regular upper body clothing?: A Lot Help from another person to put on and taking off regular lower body clothing?: A Lot 6 Click Score: 13   End of Session Nurse Communication: Other (comment)(wound vac status)  Activity  Tolerance: Patient tolerated treatment well;Patient limited by pain Patient left: in bed;with call bell/phone within reach;with bed alarm set;Other (comment)(prevalon boots readjusted for better fit)  OT Visit Diagnosis: Other abnormalities of gait and mobility (R26.89);Muscle weakness (generalized) (M62.81);Pain;Adult, failure to thrive (R62.7) Pain - Right/Left: (sacrum)                Time: 7209-4709 OT Time Calculation (min): 42 min Charges:  OT General Charges $OT Visit: 1 Visit OT Evaluation $OT Eval Moderate Complexity: 1 Mod OT Treatments $Self Care/Home Management : 23-37 mins  Jeni Salles, MPH, MS, OTR/L ascom [phone] 02/09/19, 4:06 PM

## 2019-02-09 NOTE — Progress Notes (Signed)
[facility] [place] NAME: Yesenia Shepard    MR#:  [id number]  DATE OF BIRTH:  10-Aug-1950  SUBJECTIVE:  CHIEF COMPLAINT:   Chief Complaint  Patient presents with  . Hypotension   No new complaint this morning.  No fevers.  Wound dressing with wound VAC change done by surgeon at bedside on 02/07/2019.  REVIEW OF SYSTEMS:  Review of Systems  Constitutional: Negative for chills and fever.  HENT: Negative for hearing loss and tinnitus.   Eyes: Negative for blurred vision and double vision.  Respiratory: Negative for cough and hemoptysis.   Cardiovascular: Negative for chest pain and palpitations.  Gastrointestinal: Negative for heartburn and nausea.  Genitourinary: Negative for dysuria and urgency.  Musculoskeletal: Negative for myalgias and neck pain.       Pain at the sacrum following dressing change today.  Skin: Negative for itching and rash.  Neurological: Negative for dizziness and headaches.  Psychiatric/Behavioral: Negative for depression and substance abuse.    DRUG ALLERGIES:  No Known Allergies VITALS:  Blood pressure 126/67, pulse 76, temperature 98.1 F (36.7 C), temperature source Oral, resp. rate 17, height 5\' 6"  (1.676 m), weight 53 kg, SpO2 96 %. PHYSICAL EXAMINATION:  Physical Exam   Constitutional: Appears thin and frail no distress. HENT: Normocephalic.  Eyes: Conjunctivae and EOM are normal. PERRLA, no scleral icterus.  Neck: Normal ROM. Neck supple. No JVD. No tracheal deviation. CVS: RRR, S1/S2 +, no murmurs, no gallops, no carotid bruit.  Pulmonary: Effort and breath sounds normal, no stridor, rhonchi, wheezes, rales.  Abdominal: Soft. BS +,  no distension, tenderness, rebound or guarding.  Musculoskeletal: Normal range of motion. No edema and no tenderness.  Neuro: Alert and oriented. No focal deficits. Skin: Stage IV sacral decub ulcer with wound VAC in place. Marland Kitchen Psychiatric: Flat affect.  LABORATORY  PANEL:  Female CBC Recent Labs  Lab 02/08/19 0436  WBC 11.2*  HGB 7.8*  HCT 25.3*  PLT 494*   ------------------------------------------------------------------------------------------------------------------ Chemistries  Recent Labs  Lab 02/09/19 0536  NA 142  K 3.5  CL 111  CO2 27  GLUCOSE 109*  BUN 14  CREATININE 0.92  CALCIUM 7.5*  MG 2.0   RADIOLOGY:  No results found. ASSESSMENT AND PLAN:   68 year old female with depression who goes from [facility] due to possible infected sacral decubitus ulcer.  1.  Sepsis: Patient presented with hypotension, leukocytosis.  Sepsis is due to infected sacral decubitus ulcer The patient has been on cefepime, changed to Unasyn.  Patient also on linezolid due to VRE in culture..  On low-dose of Lexapro.  To watch for possibility of serotonin syndrome closely. Pain control. S/p sacral DU debridement.  S/P wound VAC placement. Resume Plavix. Surgeon following.  Wound VAC dressing change done at bedside by surgeon on 02/07/2019.  Plan is to continue wound VAC changes 3 times weekly on TTS and PRN.   Patient currently on IV antibiotics with linezolid and Unasyn.  Discussed case with infectious disease specialist.  Patient may be discharged on linezolid plus Augmentin x4 weeks but if sensitivity results come back, ID may consider changing Augmentin to Flagyl. Case manager working on authorization with insurance company.  Anticipate discharge tomorrow if approved by insurance company.  2.  Sacral decubitus ulcer: As above. Surgical service following patient.  3.  Acute on chronic anemia: Anemia panel is consistent with iron deficiency.   Patient is status post 1 unit PRBC due to drop  in hemoglobin.  She also received IV iron.  .  Hemoglobin stable at 7.8 recently.  4.  Protein calorie malnutrition: Continue nutritional supplements  5.  Depression: Patient is on Lexapro and clonazepam as needed  6.  Chronic pain syndrome on  chronic methadone CKD stage III.  Stable.  7 Acute renal failure due to dehydration.  Improved with IV fluid support.  8. Hypophosphatemia.  Replaced  DVT prophylaxis; SCDs Heparin Protonix being avoided due to anemia requiring packed red blood cell transfusion.  All the records are reviewed and case discussed with Care Management/Social Worker. Management plans discussed with the patient, family and they are in agreement. Called and updated patient's sister Ms. Terry on treatment and discharge plans.  CODE STATUS: DNR  TOTAL TIME TAKING CARE OF THIS PATIENT: 14minutes.   More than 50% of the time was spent in counseling/coordination of care: YES  POSSIBLE D/C IN 2 DAYS, DEPENDING ON CLINICAL CONDITION.   Diamonds Lippard M.D on 02/09/2019 at 2:46 PM  Between 7am to 6pm - Pager - [phone]  After 6pm go to [email or web address] - Proofreader  [facility]  Office  [phone]  CC: Primary care physician; [name], [name], MD  Note: This dictation was prepared with Dragon dictation along with smaller phrase technology. Any transcriptional errors that result from this process are unintentional.

## 2019-02-09 NOTE — Progress Notes (Addendum)
[place] [facility] Hospital Day(s): 12.   Post op day(s): 4 Days Post-Op.   Interval History: Patient seen and examined, no acute events or new complaints overnight. Patient reports discomfort in sacral region. She is otherwise pleasant and appears improved in mentation. No fevers. She is anxious about wound vac changes. No other acute issues appreciable.    Vital signs in last 24 hours: [min-max] current  Temp:  [98 F (36.7 C)-98.1 F (36.7 C)] 98.1 F (36.7 C) (08/02 2124) Pulse Rate:  [72-134] 76 (08/03 0636) Resp:  [16-17] 17 (08/02 2124) BP: (126-141)/(52-77) 126/67 (08/03 0513) SpO2:  [96 %-99 %] 96 % (08/03 0636)     Height: 5\' 6"  (167.6 cm) Weight: 53 kg BMI (Calculated): 18.87   Intake/Output last 2 shifts:  08/02 0701 - 08/03 0700 In: 240 [P.O.:240] Out: 525 [Urine:400; Drains:125]   Physical Exam:  Constitutional: alert, cooperative and no distress  Respiratory: breathing non-labored at rest  Integumentary: Wound vac to sacrum, good seal, serosanguinous   Labs:  CBC Latest Ref Rng & Units 02/08/2019 02/07/2019 02/06/2019  WBC 4.0 - 10.5 K/uL 11.2(H) 11.4(H) 15.4(H)  Hemoglobin 12.0 - 15.0 g/dL 7.8(L) 7.7(L) 7.4(L)  Hematocrit 36.0 - 46.0 % 25.3(L) 25.2(L) 24.4(L)  Platelets 150 - 400 K/uL 494(H) 435(H) 450(H)   CMP Latest Ref Rng & Units 02/09/2019 02/08/2019 02/07/2019  Glucose 70 - 99 mg/dL 109(H) 114(H) 130(H)  BUN 8 - 23 mg/dL 14 13 17   Creatinine 0.44 - 1.00 mg/dL 0.92 0.72 0.85  Sodium 135 - 145 mmol/L 142 142 142  Potassium 3.5 - 5.1 mmol/L 3.5 3.2(L) 3.7  Chloride 98 - 111 mmol/L 111 110 111  CO2 22 - 32 mmol/L 27 25 25   Calcium 8.9 - 10.3 mg/dL 7.5(L) 7.8(L) 7.8(L)  Total Protein 6.5 - 8.1 g/dL - - -  Total Bilirubin 0.3 - 1.2 mg/dL - - -  Alkaline Phos 38 - 126 U/L - - -  AST 15 - 41 U/L - - -  ALT 0 - 44 U/L - - -     Imaging studies: No new pertinent imaging studies   Assessment/Plan:  68 y.o. female doing well 4  Days Post-Op s/p debridement and wound vac placement for infected sacral decubitus ulcer, complicated by multiple pertinent comorbidities.   - Monitor wound vac; last changed Saturday 08/01   - Currently on TThS scheduled + prn  - Continue IV ABx (Unasyn + Linezolid); appreciate ID involvement   - pain control prn - monitor leukocytosis; hgb - further management per primary team    All of the above findings and recommendations were discussed with the medical team.  -- Edison Simon, PA-C [facility] 02/09/2019, 8:09 AM [phone] M-F: 7am - 4pm  Osteomyelitis present on bone biopsy.  ID should weigh in regarding treatment course of antibiotics; will likely need a PICC line prior to d/c.  Will plan to change [facility] tomorrow. Will request RN to administer pain meds prior to change.  I saw and evaluated the patient.  I agree with the above documentation, exam, and plan, which I have edited where appropriate. Fredirick Maudlin  10:42 AM

## 2019-02-09 NOTE — Progress Notes (Signed)
Physical Therapy Treatment Patient Details Name: Yesenia Shepard MRN: [id number] DOB: 03-May-1951 Today's Date: 02/09/2019    History of Present Illness Pt presented to ER 7/22 from STR secondary to hypotension, weakness; admitted for management of UTI, infected sacral decubitus.  PMH includes L foot 5th Yesenia Shepard resection 10/26/18.  Pt s/p I&D sacral decubitus ulcer and deep bone biopsy 02/03/19; continue at transfer PT order received post op.  Pt s/p wound vac placement 02/05/19 with continue at transfer order received.    PT Comments    Pt is making limited progress towards goals. Hesitant to perform therapy due to pain with movement. Wound vac with minimal drainage noted. Discussed importance of participation and maintaining strength for OOB mobility. Pt agreeable to perform supine<>sit however once in long sitting position, she is unable to further tolerate mobility secondary to intense pain. Able to perform there-ex, however noted plantarflexion contractures present. Will continue to progress as able.   Follow Up Recommendations  SNF     Equipment Recommendations       Recommendations for Other Services       Precautions / Restrictions Precautions Precautions: Fall Precaution Comments: sacral decub with wound vac, L heel decub Restrictions Weight Bearing Restrictions: No    Mobility  Bed Mobility Overal bed mobility: Needs Assistance Bed Mobility: Supine to Sit     Supine to sit: Min assist     General bed mobility comments: able to perform long sitting in bed with min assist. only able to maintain for approx 1 minute prior to fatigue/pain. Refused to re-attempt  Transfers                 General transfer comment: unable to tolerate  Ambulation/Gait                 Stairs             Wheelchair Mobility    Modified Rankin (Stroke Patients Only)       Balance                                            Cognition  Arousal/Alertness: Awake/alert Behavior During Therapy: WFL for tasks assessed/performed Overall Cognitive Status: Within Functional Limits for tasks assessed                                        Exercises Other Exercises Other Exercises: supine ther-ex performed on B LE including SLRs, hip abd/add, and heel slides. Pt has inattention, more concentration on TV than ther-ex. Needs cues for correct technique. Also performed 5 reps of supine bridging, slightly able to lift buttocks off bed    General Comments        Pertinent Vitals/Pain Pain Assessment: Faces Faces Pain Scale: Hurts little more Pain Location: buttocks Pain Descriptors / Indicators: Discomfort Pain Intervention(s): Limited activity within patient's tolerance    Home Living                      Prior Function            PT Goals (current goals can now be found in the care plan section) Acute Rehab PT Goals Patient Stated Goal: pain control PT Goal Formulation: With patient Time For Goal Achievement: 02/18/19 Potential to  Achieve Goals: Fair Progress towards PT goals: Progressing toward goals    Frequency    Min 2X/week      PT Plan Current plan remains appropriate    Co-evaluation              AM-PAC PT "6 Clicks" Mobility   Outcome Measure  Help needed turning from your back to your side while in a flat bed without using bedrails?: A Lot Help needed moving from lying on your back to sitting on the side of a flat bed without using bedrails?: Total Help needed moving to and from a bed to a chair (including a wheelchair)?: Total Help needed standing up from a chair using your arms (e.g., wheelchair or bedside chair)?: Total Help needed to walk in hospital room?: Total Help needed climbing 3-5 steps with a railing? : Total 6 Click Score: 7    End of Session   Activity Tolerance: Patient limited by pain Patient left: in bed;with call bell/phone within reach;with  bed alarm set;Other (comment) Nurse Communication: Mobility status;Precautions;Other (comment) PT Visit Diagnosis: Muscle weakness (generalized) (M62.81);Difficulty in walking, not elsewhere classified (R26.2);Pain Pain - part of body: (sacrum)     Time: 4103-0131 PT Time Calculation (min) (ACUTE ONLY): 23 min  Charges:  $Therapeutic Exercise: 8-22 mins $Therapeutic Activity: 8-22 mins                     Greggory Stallion, PT, DPT [phone]    Sheniqua Carolan 02/09/2019, 3:50 PM

## 2019-02-10 LAB — AEROBIC/ANAEROBIC CULTURE W GRAM STAIN (SURGICAL/DEEP WOUND)

## 2019-02-10 LAB — GLUCOSE, CAPILLARY
Glucose-Capillary: 115 mg/dL — ABNORMAL HIGH (ref 70–99)
Glucose-Capillary: 132 mg/dL — ABNORMAL HIGH (ref 70–99)

## 2019-02-10 LAB — BASIC METABOLIC PANEL
Anion gap: 4 — ABNORMAL LOW (ref 5–15)
BUN: 14 mg/dL (ref 8–23)
CO2: 26 mmol/L (ref 22–32)
Calcium: 7.5 mg/dL — ABNORMAL LOW (ref 8.9–10.3)
Chloride: 111 mmol/L (ref 98–111)
Creatinine, Ser: 0.83 mg/dL (ref 0.44–1.00)
GFR calc Af Amer: >60 mL/min (ref >60)
GFR calc non Af Amer: >60 mL/min (ref >60)
Glucose, Bld: 157 mg/dL — ABNORMAL HIGH (ref 70–99)
Potassium: 3.6 mmol/L (ref 3.5–5.1)
Sodium: 141 mmol/L (ref 135–145)

## 2019-02-10 LAB — PHOSPHORUS: Phosphorus: 2.9 mg/dL (ref 2.5–4.6)

## 2019-02-10 NOTE — TOC Progression Note (Signed)
Transition of Care [facility]) - Progression Note    Patient Details  Name: Yesenia Shepard MRN: [id number] Date of Birth: 06-Dec-1950  Transition of Care [facility]) CM/SW [place], LCSW Phone Number: 02/10/2019, 4:07 PM  Clinical Narrative: [facility] has received insurance approval. Admissions coordinator ordered wound vac.         Expected Discharge Plan and Services           Expected Discharge Date: 02/03/19                                     Social Determinants of Health (SDOH) Interventions    Readmission Risk Interventions Readmission Risk Prevention Plan 01/30/2019  Transportation Screening Complete  Medication Review [employer]) Complete  HRI or [place] Complete  SW Recovery Care/Counseling Consult Complete  Palliative Care Screening Not Applicable  Skilled Nursing Facility Complete  Some recent data might be hidden

## 2019-02-10 NOTE — Progress Notes (Signed)
PT Cancellation Note  Patient Details Name: Yesenia Shepard MRN: [id number] DOB: October 26, 1950   Cancelled Treatment:    Reason Eval/Treat Not Completed: Other (comment)   Offered and encouraged session.  Pt in bed and refused all attempts at exercise or mobility.  She stated she did not feel well and was tired of being "bugged."  Will continue as appropriate.   Chesley Noon 02/10/2019, 12:21 PM

## 2019-02-10 NOTE — Progress Notes (Signed)
Pharmacy Electrolyte Monitoring Consult:  Pharmacy consulted to assist in monitoring and replacing electrolytes in this 68 y.o. female admitted on 01/28/2019 with Hypotension   Labs:  Sodium (mmol/L)  Date Value  02/10/2019 141  02/04/2015 139   Potassium (mmol/L)  Date Value  02/10/2019 3.6   Magnesium (mg/dL)  Date Value  02/09/2019 2.0   Phosphorus (mg/dL)  Date Value  02/10/2019 2.9   Calcium (mg/dL)  Date Value  02/10/2019 7.5 (L)   Albumin (g/dL)  Date Value  01/28/2019 2.3 (L)   Corrected Ca: 8.86 mg/dL  Assessment/Plan:   No electrolyte supplementation required    Recheck labs in AM   Pharmacy will continue to follow.   Delorus Langwell A, PharmD 02/10/2019 7:28 AM

## 2019-02-10 NOTE — Progress Notes (Signed)
Yesenia Shepard at [place] NAME: Yesenia Shepard    MR#:  [id number]  DATE OF BIRTH:  March 28, 1951  SUBJECTIVE:  CHIEF COMPLAINT:   Chief Complaint  Patient presents with  . Hypotension   No new complaint this morning.  No fevers.  Wound dressing with wound VAC change done by surgeon at bedside today.  REVIEW OF SYSTEMS:  Review of Systems  Constitutional: Negative for chills and fever.  HENT: Negative for hearing loss and tinnitus.   Eyes: Negative for blurred vision and double vision.  Respiratory: Negative for cough and hemoptysis.   Cardiovascular: Negative for chest pain and palpitations.  Gastrointestinal: Negative for heartburn and nausea.  Genitourinary: Negative for dysuria and urgency.  Musculoskeletal: Negative for myalgias and neck pain.  Skin: Negative for itching and rash.  Neurological: Negative for dizziness and headaches.  Psychiatric/Behavioral: Negative for depression and substance abuse.    DRUG ALLERGIES:  No Known Allergies VITALS:  Blood pressure (!) 149/76, pulse 73, temperature 98.7 F (37.1 C), temperature source Oral, resp. rate 16, height 5\' 6"  (1.676 m), weight 53 kg, SpO2 97 %. PHYSICAL EXAMINATION:  Physical Exam   Constitutional: Appears thin and frail no distress. HENT: Normocephalic.  Eyes: Conjunctivae and EOM are normal. PERRLA, no scleral icterus.  Neck: Normal ROM. Neck supple. No JVD. No tracheal deviation. CVS: RRR, S1/S2 +, no murmurs, no gallops, no carotid bruit.  Pulmonary: Effort and breath sounds normal, no stridor, rhonchi, wheezes, rales.  Abdominal: Soft. BS +,  no distension, tenderness, rebound or guarding.  Musculoskeletal: Normal range of motion. No edema and no tenderness.  Neuro: Alert and oriented. No focal deficits. Skin: Stage IV sacral decub ulcer with wound VAC in place. Marland Kitchen Psychiatric: Flat affect.  LABORATORY PANEL:  Female CBC Recent Labs  Lab 02/08/19 0436  WBC 11.2*   HGB 7.8*  HCT 25.3*  PLT 494*   ------------------------------------------------------------------------------------------------------------------ Chemistries  Recent Labs  Lab 02/09/19 0536 02/10/19 0332  NA 142 141  K 3.5 3.6  CL 111 111  CO2 27 26  GLUCOSE 109* 157*  BUN 14 14  CREATININE 0.92 0.83  CALCIUM 7.5* 7.5*  MG 2.0  --    RADIOLOGY:  No results found. ASSESSMENT AND PLAN:   68 year old female with depression who goes from [facility] due to possible infected sacral decubitus ulcer.  1.  Sepsis: Patient presented with hypotension, leukocytosis.  Sepsis is due to infected sacral decubitus ulcer The patient has been on cefepime, changed to Unasyn.  Patient also on linezolid due to VRE in culture..  On low-dose of Lexapro.  To watch for possibility of serotonin syndrome closely. Pain control. S/p sacral DU debridement.  S/P wound VAC placement. Resume Plavix. Surgeon following.  Wound VAC dressing change done at bedside by surgeon today.  Plan is to continue wound VAC changes 3 times weekly on TTS and PRN.   Patient currently on IV antibiotics with linezolid and Unasyn.  Discussed case with infectious disease specialist.  Patient may be discharged on linezolid plus Augmentin x4 weeks  Plans for discharge to nursing home once the wound VAC ordered by the nursing home is delivered today  2.  Sacral decubitus ulcer: As above. Surgical service following patient.  3.  Acute on chronic anemia: Anemia panel is consistent with iron deficiency.   Patient is status post 1 unit PRBC due to drop in hemoglobin.  She also received IV iron.  .  Hemoglobin stable  at 7.8 recently.  4.  Protein calorie malnutrition: Continue nutritional supplements  5.  Depression: Patient is on Lexapro and clonazepam as needed  6.  Chronic pain syndrome on chronic methadone CKD stage III.  Stable.  7 Acute renal failure due to dehydration.  Improved with IV fluid support.  8.  Hypophosphatemia.  Replaced  DVT prophylaxis; SCDs Heparin Protonix being avoided due to anemia requiring packed red blood cell transfusion.  All the records are reviewed and case discussed with Care Management/Social Worker. Management plans discussed with the patient, family and they are in agreement. Called and updated patient's sister Ms. Coralyn Mark recently on treatment and discharge plans.  CODE STATUS: DNR  TOTAL TIME TAKING CARE OF THIS PATIENT: 85minutes.   More than 50% of the time was spent in counseling/coordination of care: YES  POSSIBLE D/C IN 2 DAYS, DEPENDING ON CLINICAL CONDITION.   Jonnatan Hanners M.D on 02/10/2019 at 2:27 PM  Between 7am to 6pm - Pager - [phone]  After 6pm go to [email or web address] - Proofreader  [employer] [facility]  Office  [phone]  CC: Primary care physician; [name], [name], MD  Note: This dictation was prepared with Dragon dictation along with smaller phrase technology. Any transcriptional errors that result from this process are unintentional.

## 2019-02-10 NOTE — Consult Note (Signed)
WOC consulted for NPWT dressing changes S/P debridement per surgery team.  Requested NPWT frequency to be updated for M/W/F dressings for [facility] nursing staff and potential DC to [facility] care.   Verbal order for next dressing to be on Friday 02/13/19.    Orders updated, surgery reported to sign off  [place], Dora, [place]

## 2019-02-10 NOTE — Progress Notes (Addendum)
[facility] Hospital Day(s): [place] day(s): 5 Days Post-Op.   Interval History: Patient seen and examined, no acute events or new complaints overnight. Patient doing well. She is due for wound vac change today. No other issues. Awaiting for home wound vac approval from SNF.    Vital signs in last 24 hours: [min-max] current  Temp:  [98.3 F (36.8 C)] 98.3 F (36.8 C) (08/04 0220) Pulse Rate:  [69] 69 (08/04 0220) Resp:  [18] 18 (08/04 0220) BP: (128)/(65) 128/65 (08/04 0220) SpO2:  [99 %] 99 % (08/04 0220)     Height: 5\' 6"  (167.6 cm) Weight: 53 kg BMI (Calculated): 18.87   Intake/Output last 2 shifts:  08/03 0701 - 08/04 0700 In: 4528.7 [I.V.:3358.7; IV Piggyback:1169.9] Out: 825 [Urine:475; Drains:350]   Physical Exam:  Constitutional: alert, cooperative and no distress  Respiratory: breathing non-labored at rest  Integumentary: sacral ulceration with pink granulation tissue, bone exposed, no necrotic tissue - wound vac changed at bedside, good seal   Labs:  CBC Latest Ref Rng & Units 02/08/2019 02/07/2019 02/06/2019  WBC 4.0 - 10.5 K/uL 11.2(H) 11.4(H) 15.4(H)  Hemoglobin 12.0 - 15.0 g/dL 7.8(L) 7.7(L) 7.4(L)  Hematocrit 36.0 - 46.0 % 25.3(L) 25.2(L) 24.4(L)  Platelets 150 - 400 K/uL 494(H) 435(H) 450(H)   CMP Latest Ref Rng & Units 02/10/2019 02/09/2019 02/08/2019  Glucose 70 - 99 mg/dL 157(H) 109(H) 114(H)  BUN 8 - 23 mg/dL 14 14 13   Creatinine 0.44 - 1.00 mg/dL 0.83 0.92 0.72  Sodium 135 - 145 mmol/L 141 142 142  Potassium 3.5 - 5.1 mmol/L 3.6 3.5 3.2(L)  Chloride 98 - 111 mmol/L 111 111 110  CO2 22 - 32 mmol/L 26 27 25   Calcium 8.9 - 10.3 mg/dL 7.5(L) 7.5(L) 7.8(L)  Total Protein 6.5 - 8.1 g/dL - - -  Total Bilirubin 0.3 - 1.2 mg/dL - - -  Alkaline Phos 38 - 126 U/L - - -  AST 15 - 41 U/L - - -  ALT 0 - 44 U/L - - -    Imaging studies: No new pertinent imaging studies   Assessment/Plan:  68 y.o. female doing well  from surgical standpoint 5 Days Post-Op s/p debridement and wound vac placemnt for infected sacral ulcer, complicated by multiple pertinent comorbidities.    - Wound vac changed at bedside; Currently on TThS scheduled + prn  - Will ask WOC RN for further help with wound vac changes             - Continue IV ABx (Unasyn + Linezolid); appreciate ID involvement and recommendations             - pain control prn - monitor leukocytosis; hgb - further management per primary team   - No further acute surgical issues, would appreciate WOC RN help with further wound vac changes while in house, surgery will sign off  All of the above findings and recommendations were discussed with the medical team.  -- Edison Simon, PA-C [facility] 02/10/2019, 11:03 AM [phone] M-F: 7am - 4pm  I saw and evaluated the patient.  I agree with the above documentation, exam, and plan, which I have edited where appropriate. Fredirick Maudlin  1:38 PM

## 2019-02-11 ENCOUNTER — Inpatient Hospital Stay: Payer: Self-pay

## 2019-02-11 DIAGNOSIS — B952 Enterococcus as the cause of diseases classified elsewhere: Secondary | ICD-10-CM

## 2019-02-11 DIAGNOSIS — Z1621 Resistance to vancomycin: Secondary | ICD-10-CM

## 2019-02-11 DIAGNOSIS — F339 Major depressive disorder, recurrent, unspecified: Secondary | ICD-10-CM

## 2019-02-11 DIAGNOSIS — D649 Anemia, unspecified: Secondary | ICD-10-CM

## 2019-02-11 LAB — BASIC METABOLIC PANEL
Anion gap: 9 (ref 5–15)
BUN: 11 mg/dL (ref 8–23)
CO2: 26 mmol/L (ref 22–32)
Calcium: 8.1 mg/dL — ABNORMAL LOW (ref 8.9–10.3)
Chloride: 110 mmol/L (ref 98–111)
Creatinine, Ser: 0.86 mg/dL (ref 0.44–1.00)
GFR calc Af Amer: >60 mL/min (ref >60)
GFR calc non Af Amer: >60 mL/min (ref >60)
Glucose, Bld: 122 mg/dL — ABNORMAL HIGH (ref 70–99)
Potassium: 4.1 mmol/L (ref 3.5–5.1)
Sodium: 145 mmol/L (ref 135–145)

## 2019-02-11 LAB — GLUCOSE, CAPILLARY: Glucose-Capillary: 102 mg/dL — ABNORMAL HIGH (ref 70–99)

## 2019-02-11 LAB — SEDIMENTATION RATE: Sed Rate: 126 mm/hr — ABNORMAL HIGH (ref 0–30)

## 2019-02-11 LAB — PHOSPHORUS: Phosphorus: 3.1 mg/dL (ref 2.5–4.6)

## 2019-02-11 LAB — SARS CORONAVIRUS 2 BY RT PCR (HOSPITAL ORDER, PERFORMED IN ~~LOC~~ HOSPITAL LAB): SARS Coronavirus 2: NEGATIVE

## 2019-02-11 MED ORDER — SODIUM CHLORIDE 0.9% FLUSH: 10.0000 mL | INTRAVENOUS | Status: DC | PRN

## 2019-02-11 MED ORDER — HYDROCODONE-ACETAMINOPHEN 5-325 MG PO TABS: 1.0000 | ORAL_TABLET | Freq: Four times a day (QID) | ORAL | 0 refills | Status: DC | PRN

## 2019-02-11 MED ORDER — BISACODYL 5 MG PO TBEC: 5.0000 mg | DELAYED_RELEASE_TABLET | Freq: Every day | ORAL | 0 refills | Status: DC | PRN

## 2019-02-11 MED ORDER — DOCUSATE SODIUM 100 MG PO CAPS: 100.0000 mg | ORAL_CAPSULE | Freq: Two times a day (BID) | ORAL | 0 refills | Status: DC

## 2019-02-11 MED ORDER — SODIUM CHLORIDE 0.9 % IV SOLN: 3.0000 g | Freq: Four times a day (QID) | INTRAVENOUS | 0 refills | Status: DC

## 2019-02-11 MED ORDER — SODIUM CHLORIDE 0.9% FLUSH
10.0000 mL | Freq: Two times a day (BID) | INTRAVENOUS | Status: DC
  Administered 2019-02-11: 21:00:00 10 mL

## 2019-02-11 MED ORDER — FAMOTIDINE 20 MG PO TABS: 20.0000 mg | ORAL_TABLET | Freq: Every day | ORAL | 0 refills | Status: AC

## 2019-02-11 MED ORDER — METHADONE HCL 10 MG/ML PO CONC: 65.0000 mg | Freq: Every day | ORAL | 0 refills | Status: AC

## 2019-02-11 NOTE — Progress Notes (Addendum)
RN call report to [facility]. Report given to [facility] LPN. Pt will be discharge as soon and the iv team place PICC line.

## 2019-02-11 NOTE — Progress Notes (Signed)
Peripherally Inserted Central Catheter/Midline Placement  The IV Nurse has discussed with the patient and/or persons authorized to consent for the patient, the purpose of this procedure and the potential benefits and risks involved with this procedure.  The benefits include less needle sticks, lab draws from the catheter, and the patient may be discharged home with the catheter. Risks include, but not limited to, infection, bleeding, blood clot (thrombus formation), and puncture of an artery; nerve damage and irregular heartbeat and possibility to perform a PICC exchange if needed/ordered by physician.  Alternatives to this procedure were also discussed.  B[employer] PICC patient education guide, fact sheet on infection prevention and patient information card has been provided to patient /or left at bedside.    PICC/Midline Placement Documentation  PICC Single Lumen 27/07/86 PICC Right Basilic 36 cm 0 cm (Active)  Indication for Insertion or Continuance of Line Home intravenous therapies (PICC only) 02/11/19 1800  Exposed Catheter (cm) 0 cm 02/11/19 1800  Site Assessment Clean;Dry;Intact 02/11/19 1800  Line Status Flushed;Saline locked;Blood return noted 02/11/19 1800  Dressing Type Transparent;Securing device 02/11/19 1800  Dressing Status Clean;Dry;Intact;Antimicrobial disc in place 02/11/19 1800  [place] checked and tightened 02/11/19 1800  Dressing Intervention New dressing 02/11/19 1800  Dressing Change Due 02/18/19 02/11/19 1800       Yesenia Shepard 02/11/2019, 6:18 PM

## 2019-02-11 NOTE — Treatment Plan (Signed)
Diagnosis: Sacral osteomyelitis with stage IV decub- VRE and anerobes Baseline Creatinine 0.86    No Known Allergies  OPAT Orders Discharge antibiotics: unasyn 3 grams IV every 6hours until 03/08/19  [facility] Care Per Protocol:  Labs weekly on Monday  while on IV antibiotics: _X_ CBC with differential  _X_ CMP _X_ CRP _X_ ESR   _X_ Please pull PIC at completion of IV antibiotics  Fax weekly labs to [phone]  Clinic Follow Up Appt:with Dr.Merrill Deanda 3 weeks  Call [phone] to make appt

## 2019-02-11 NOTE — Discharge Summary (Signed)
[facility] NAME: Halley Shepheard    MR#:  [id number]  DATE OF BIRTH:  1951-05-16  DATE OF ADMISSION:  01/28/2019   ADMITTING PHYSICIAN: Epifanio Lesches, MD  DATE OF DISCHARGE: 02/11/2019  PRIMARY CARE PHYSICIAN: [name], [name], MD   ADMISSION DIAGNOSIS:  Weakness [R53.1] Acute cystitis with hematuria [N30.01] AKI (acute kidney injury) ([place]) [N17.9] Pressure injury of sacral region, unstageable ([place]) [L89.150] DISCHARGE DIAGNOSIS:  Active Problems:   Decubitus ulcer, infected  SECONDARY DIAGNOSIS:   Past Medical History:  Diagnosis Date  . Anxiety   . Arthritis   . Chronic pain   . Depression   . Diabetes mellitus without complication ([place])   . Drug abuse ([place])    History of polysubstance abuse; currently on methadone.  . Hepatitis    History of Hep "C". treated and cured with Harvoni  . History of chicken pox   . Hypertension   . Panic attacks   . Peripheral vascular disease ([place])    stent in place.   Marland Kitchen UTI (urinary tract infection)    History of   HOSPITAL COURSE:  Chief complaint; hypertension   History of presenting complaint; Yesenia Shepard  is a 68 y.o. female with a known history of hepatitis C, chronic pain syndrome, depression comes from [facility] secondary to hypotension, greenish-brown discharge from sacral decubiti.  Patient recently was given Levaquin for UTI.  Patient history obtained from ER charts as she is lethargic.  Patient is hypotensive, found to have UTI, infected sacral decubiti.  Patient was admitted to medical service for further evaluation and management.   Hospital course; 1. Sepsis: Patient presented with hypotension, leukocytosis. Sepsis is due to infected sacral decubitus ulcer.  Patient was initially treated with IV cefepime and subsequently changed to IV Unasyn. Patient was also treated with IV linezolid due to VRE in blood culture during this admission.   This is also sensitive to ampicillin.  I discussed extensively with infectious disease specialist Dr. Ramon Dredge today and she recommended discharging patient on IV Unasyn until 03/08/2019.  She has suspected patient may need p.o. Augmentin for 2 weeks following that.  I made an appointment to follow-up with post discharge from the hospital and she will make determination if Augmentin is needed at that time.  PICC line being placed today prior to discharge on IV Unasyn. S/p sacral DU debridement.S/P wound VAC placement.Resumed Plavix.  Surgical service has signed off.  Nursing facility to continue managing wound with wound VAC on discharge.  2. Sacral decubitus ulcer: As above. 3. Acute on chronic anemia: Anemia panel is consistent with iron deficiency.  Patient is status post 1 unit PRBC due to drop in hemoglobin.  She also received IV iron. .  Hemoglobin stable at 7.8 recently. 4. Protein calorie malnutrition: Continue nutritional supplements  5. Depression: Patient is on Lexapro and clonazepam as needed  6. Chronic pain syndrome on chronic methadone CKD stage III. Stable.  7 Acute renal failure due to dehydration. Improved with IV fluid support.  8. Hypophosphatemia.  Replaced  I have updated patient's sister listed in the chart on discharge plans for later today.  All questions were answered and she is in agreement to the plan of care as outlined. DISCHARGE CONDITIONS:  Stable CONSULTS OBTAINED:   DRUG ALLERGIES:  No Known Allergies DISCHARGE MEDICATIONS:   Allergies as of 02/11/2019   No Known Allergies     Medication List  STOP taking these medications   lactulose 10 GM/15ML solution Commonly known as: CHRONULAC   levofloxacin 500 MG tablet Commonly known as: LEVAQUIN   LORazepam 0.5 MG tablet Commonly known as: ATIVAN   potassium chloride SA 20 MEQ tablet Commonly known as: K-DUR   traMADol 50 MG tablet Commonly known as: ULTRAM   traZODone  50 MG tablet Commonly known as: DESYREL     TAKE these medications   acetaminophen 325 MG tablet Commonly known as: TYLENOL Take 2 tablets (650 mg total) by mouth every 6 (six) hours as needed for mild pain (or Fever >/= 101).   Ampicillin-Sulbactam 3 g in sodium chloride 0.9 % 100 mL Inject 3 g into the vein every 6 (six) hours for 25 days.   ARIPiprazole 2 MG tablet Commonly known as: ABILIFY Take 1 tablet (2 mg total) by mouth at bedtime.   ascorbic acid 250 MG tablet Commonly known as: VITAMIN C Take 1 tablet (250 mg total) by mouth 2 (two) times daily.   atorvastatin 40 MG tablet Commonly known as: LIPITOR Take 1 tablet (40 mg total) by mouth daily.   bisacodyl 5 MG EC tablet Commonly known as: DULCOLAX Take 1 tablet (5 mg total) by mouth daily as needed for moderate constipation.   cholecalciferol 25 MCG (1000 UT) tablet Commonly known as: VITAMIN D3 Take 1 tablet (1,000 Units total) by mouth daily.   clonazepam 0.125 MG disintegrating tablet Commonly known as: KLONOPIN Take 0.125 mg by mouth every 8 (eight) hours as needed (anxiety). What changed: Another medication with the same name was removed. Continue taking this medication, and follow the directions you see here.   clopidogrel 75 MG tablet Commonly known as: PLAVIX TAKE 1 TABLET BY MOUTH ONCE DAILY   docusate sodium 100 MG capsule Commonly known as: COLACE Take 1 capsule (100 mg total) by mouth 2 (two) times daily. What changed:   how much to take  when to take this  reasons to take this   escitalopram 5 MG tablet Commonly known as: LEXAPRO Take 1 tablet (5 mg total) by mouth daily.   famotidine 20 MG tablet Commonly known as: PEPCID Take 1 tablet (20 mg total) by mouth daily. Start taking on: February 12, 2019   feeding supplement (GLUCERNA SHAKE) Liqd Take 237 mLs by mouth 2 (two) times daily between meals.   ferrous sulfate 325 (65 FE) MG tablet Take 325 mg by mouth 2 (two) times daily.    HYDROcodone-acetaminophen 5-325 MG tablet Commonly known as: NORCO/VICODIN Take 1 tablet by mouth every 6 (six) hours as needed for moderate pain.   lisinopril 10 MG tablet Commonly known as: ZESTRIL Take 1 tablet (10 mg total) by mouth daily.   Melatonin 5 MG Tabs Take 5 mg by mouth at bedtime.   methadone 10 MG/ML solution Commonly known as: DOLOPHINE Take 6.5 mLs (65 mg total) by mouth daily for 4 days. Start taking on: February 12, 2019 What changed: how much to take   multivitamins with iron Tabs tablet Take 1 tablet by mouth daily.   polyethylene glycol 17 g packet Commonly known as: MIRALAX / GLYCOLAX Take 17 g by mouth daily as needed for severe constipation. What changed: when to take this        DISCHARGE INSTRUCTIONS:   DIET:  Cardiac diet DISCHARGE CONDITION:  Stable ACTIVITY:  Activity as tolerated OXYGEN:  Home Oxygen: No.  Oxygen Delivery: room air DISCHARGE LOCATION:  nursing home   If you experience  worsening of your admission symptoms, develop shortness of breath, life threatening emergency, suicidal or homicidal thoughts you must seek medical attention immediately by calling 911 or calling your MD immediately  if symptoms less severe.  You Must read complete instructions/literature along with all the possible adverse reactions/side effects for all the Medicines you take and that have been prescribed to you. Take any new Medicines after you have completely understood and accpet all the possible adverse reactions/side effects.   Please note  You were cared for by a hospitalist during your hospital stay. If you have any questions about your discharge medications or the care you received while you were in the hospital after you are discharged, you can call the unit and asked to speak with the hospitalist on call if the hospitalist that took care of you is not available. Once you are discharged, your primary care physician will handle any further medical  issues. Please note that NO REFILLS for any discharge medications will be authorized once you are discharged, as it is imperative that you return to your primary care physician (or establish a relationship with a primary care physician if you do not have one) for your aftercare needs so that they can reassess your need for medications and monitor your lab values.    On the day of Discharge:  VITAL SIGNS:  Blood pressure (!) 153/68, pulse 70, temperature 98.6 F (37 C), temperature source Oral, resp. rate 18, height 5\' 6"  (1.676 m), weight 53 kg, SpO2 96 %. PHYSICAL EXAMINATION:  GENERAL:  68 y.o.-year-old patient lying in the bed with no acute distress.  EYES: Pupils equal, round, reactive to light and accommodation. No scleral icterus. Extraocular muscles intact.  HEENT: Head atraumatic, normocephalic. Oropharynx and nasopharynx clear.  NECK:  Supple, no jugular venous distention. No thyroid enlargement, no tenderness.  LUNGS: Normal breath sounds bilaterally, no wheezing, rales,rhonchi or crepitation. No use of accessory muscles of respiration.  CARDIOVASCULAR: S1, S2 normal. No murmurs, rubs, or gallops.  ABDOMEN: Soft, non-tender, non-distended. Bowel sounds present. No organomegaly or mass.  EXTREMITIES: No pedal edema, cyanosis, or clubbing.  NEUROLOGIC: Cranial nerves II through XII are intact. Muscle strength 5/5 in all extremities. Sensation intact. Gait not checked.  PSYCHIATRIC: The patient is alert and oriented x 3.  SKIN:Stage IV sacral decub ulcer with wound VAC in place. Marland Kitchen  DATA REVIEW:   CBC Recent Labs  Lab 02/08/19 0436  WBC 11.2*  HGB 7.8*  HCT 25.3*  PLT 494*    Chemistries  Recent Labs  Lab 02/09/19 0536  02/11/19 0656  NA 142   < > 145  K 3.5   < > 4.1  CL 111   < > 110  CO2 27   < > 26  GLUCOSE 109*   < > 122*  BUN 14   < > 11  CREATININE 0.92   < > 0.86  CALCIUM 7.5*   < > 8.1*  MG 2.0  --   --    < > = values in this interval not displayed.      Microbiology Results  Results for orders placed or performed during the hospital encounter of 01/28/19  SARS Coronavirus 2 (CEPHEID - Performed in [place] hospital lab), Hosp Order     Status: None   Collection Time: 01/28/19 11:14 AM   Specimen: Nasopharyngeal Swab  Result Value Ref Range Status   SARS Coronavirus 2 NEGATIVE NEGATIVE Final    Comment: (NOTE) If result is  NEGATIVE SARS-CoV-2 target nucleic acids are NOT DETECTED. The SARS-CoV-2 RNA is generally detectable in upper and lower  respiratory specimens during the acute phase of infection. The lowest  concentration of SARS-CoV-2 viral copies this assay can detect is 250  copies / mL. A negative result does not preclude SARS-CoV-2 infection  and should not be used as the sole basis for treatment or other  patient management decisions.  A negative result may occur with  improper specimen collection / handling, submission of specimen other  than nasopharyngeal swab, presence of viral mutation(s) within the  areas targeted by this assay, and inadequate number of viral copies  (<250 copies / mL). A negative result must be combined with clinical  observations, patient history, and epidemiological information. If result is POSITIVE SARS-CoV-2 target nucleic acids are DETECTED. The SARS-CoV-2 RNA is generally detectable in upper and lower  respiratory specimens dur ing the acute phase of infection.  Positive  results are indicative of active infection with SARS-CoV-2.  Clinical  correlation with patient history and other diagnostic information is  necessary to determine patient infection status.  Positive results do  not rule out bacterial infection or co-infection with other viruses. If result is PRESUMPTIVE POSTIVE SARS-CoV-2 nucleic acids MAY BE PRESENT.   A presumptive positive result was obtained on the submitted specimen  and confirmed on repeat testing.  While 2019 novel coronavirus  (SARS-CoV-2) nucleic acids may be  present in the submitted sample  additional confirmatory testing may be necessary for epidemiological  and / or clinical management purposes  to differentiate between  SARS-CoV-2 and other Sarbecovirus currently known to infect humans.  If clinically indicated additional testing with an alternate test  methodology [phone]) is advised. The SARS-CoV-2 RNA is generally  detectable in upper and lower respiratory sp ecimens during the acute  phase of infection. The expected result is Negative. Fact Sheet for Patients:  StrictlyIdeas.no Fact Sheet for Healthcare Providers: BankingDealers.co.za This test is not yet approved or cleared by the [place] FDA and has been authorized for detection and/or diagnosis of SARS-CoV-2 by FDA under an Emergency Use Authorization (EUA).  This EUA will remain in effect (meaning this test can be used) for the duration of the COVID-19 declaration under Section 564(b)(1) of the Act, 21 U.S.C. section 360bbb-3(b)(1), unless the authorization is terminated or revoked sooner. Performed at [facility], [address], [place], [place] [address]   Urine culture     Status: Abnormal   Collection Time: 01/28/19 11:14 AM   Specimen: Urine, Clean Catch  Result Value Ref Range Status   Specimen Description   Final    URINE, CLEAN CATCH Performed at [facility], [address], [place], [place] [address]    Special Requests   Final    NONE Performed at [facility], [address] [place], [place] [address]    Culture MULTIPLE SPECIES PRESENT, SUGGEST RECOLLECTION (A)  Final   Report Status 01/29/2019 FINAL  Final  Blood culture (routine x 2)     Status: None   Collection Time: 01/28/19 12:20 PM   Specimen: BLOOD  Result Value Ref Range Status   Specimen Description BLOOD LEFT ANTECUBITAL  Final   Special Requests   Final    BOTTLES DRAWN AEROBIC AND ANAEROBIC Blood Culture  adequate volume   Culture   Final    NO GROWTH 5 DAYS Performed at [facility], [address], [place], [place] [address]    Report  Status 02/02/2019 FINAL  Final  Blood culture (routine x 2)     Status: None   Collection Time: 01/28/19 12:20 PM   Specimen: BLOOD  Result Value Ref Range Status   Specimen Description BLOOD RIGHT ANTECUBITAL  Final   Special Requests   Final    BOTTLES DRAWN AEROBIC AND ANAEROBIC Blood Culture adequate volume   Culture   Final    NO GROWTH 5 DAYS Performed at [facility], [address] [place], [place] [address]    Report Status 02/02/2019 FINAL  Final  MRSA PCR Screening     Status: None   Collection Time: 01/29/19  3:26 AM   Specimen: Nasopharyngeal  Result Value Ref Range Status   MRSA by PCR NEGATIVE NEGATIVE Final    Comment:        The GeneXpert MRSA Assay (FDA approved for NASAL specimens only), is one component of a comprehensive MRSA colonization surveillance program. It is not intended to diagnose MRSA infection nor to guide or monitor treatment for MRSA infections. Performed at [facility], [address], [place], [place] [address]   Aerobic/Anaerobic Culture (surgical/deep wound)     Status: None   Collection Time: 02/03/19  2:24 PM   Specimen: [facility] Bone biopsy; Tissue  Result Value Ref Range Status   Specimen Description   Final    BIOPSY Performed at [facility], [address] [place], [place] [address]    Special Requests   Final    BONE CULTURE SACRAL DECUBITUS Performed at [facility], [address] [place], [place] [address]    Gram Stain   Final    MODERATE GRAM NEGATIVE RODS MODERATE GRAM POSITIVE COCCI IN PAIRS FEW GRAM POSITIVE RODS NO WBC SEEN    Culture   Final    FEW VANCOMYCIN RESISTANT ENTEROCOCCUS ISOLATED FEW PREVOTELLA SPECIES FEW BACTEROIDES OVATUS ORGANISM 2 BETA LACTAMASE POSITIVE ORGANISM 3 BETA LACTAMASE NEGATIVE Performed at  [facility], [place] [address] [place], [place] [address]    Report Status 02/10/2019 FINAL  Final   Organism ID, Bacteria VANCOMYCIN RESISTANT ENTEROCOCCUS ISOLATED  Final      Susceptibility   Vancomycin resistant enterococcus isolated - MIC*    AMPICILLIN <=2 SENSITIVE Sensitive     VANCOMYCIN >=32 RESISTANT Resistant     GENTAMICIN SYNERGY RESISTANT Resistant     LINEZOLID 2 SENSITIVE Sensitive     * FEW VANCOMYCIN RESISTANT ENTEROCOCCUS ISOLATED  SARS Coronavirus 2 [facility] order, Performed in [place] hospital lab)     Status: None   Collection Time: 02/11/19  9:01 AM  Result Value Ref Range Status   SARS Coronavirus 2 NEGATIVE NEGATIVE Final    Comment: (NOTE) If result is NEGATIVE SARS-CoV-2 target nucleic acids are NOT DETECTED. The SARS-CoV-2 RNA is generally detectable in upper and lower  respiratory specimens during the acute phase of infection. The lowest  concentration of SARS-CoV-2 viral copies this assay can detect is 250  copies / mL. A negative result does not preclude SARS-CoV-2 infection  and should not be used as the sole basis for treatment or other  patient management decisions.  A negative result may occur with  improper specimen collection / handling, submission of specimen other  than nasopharyngeal swab, presence of viral mutation(s) within the  areas targeted by this assay, and inadequate number of viral copies  (<250 copies / mL). A negative result must be combined with clinical  observations, patient history, and epidemiological  information. If result is POSITIVE SARS-CoV-2 target nucleic acids are DETECTED. The SARS-CoV-2 RNA is generally detectable in upper and lower  respiratory specimens dur ing the acute phase of infection.  Positive  results are indicative of active infection with SARS-CoV-2.  Clinical  correlation with patient history and other diagnostic information is  necessary to determine patient infection status.  Positive  results do  not rule out bacterial infection or co-infection with other viruses. If result is PRESUMPTIVE POSTIVE SARS-CoV-2 nucleic acids MAY BE PRESENT.   A presumptive positive result was obtained on the submitted specimen  and confirmed on repeat testing.  While 2019 novel coronavirus  (SARS-CoV-2) nucleic acids may be present in the submitted sample  additional confirmatory testing may be necessary for epidemiological  and / or clinical management purposes  to differentiate between  SARS-CoV-2 and other Sarbecovirus currently known to infect humans.  If clinically indicated additional testing with an alternate test  methodology [phone]) is advised. The SARS-CoV-2 RNA is generally  detectable in upper and lower respiratory sp ecimens during the acute  phase of infection. The expected result is Negative. Fact Sheet for Patients:  StrictlyIdeas.no Fact Sheet for Healthcare Providers: BankingDealers.co.za This test is not yet approved or cleared by the [place] FDA and has been authorized for detection and/or diagnosis of SARS-CoV-2 by FDA under an Emergency Use Authorization (EUA).  This EUA will remain in effect (meaning this test can be used) for the duration of the COVID-19 declaration under Section 564(b)(1) of the Act, 21 U.S.C. section 360bbb-3(b)(1), unless the authorization is terminated or revoked sooner. Performed at [facility], [address] [place], [place] [address]     RADIOLOGY:  [place] Ekg Site Rite  Result Date: 02/11/2019 If [facility] image not attached, placement could not be confirmed due to current cardiac rhythm.    Management plans discussed with the patient, family and they are in agreement.  CODE STATUS: DNR   TOTAL TIME TAKING CARE OF THIS PATIENT: 37 minutes.    Persephone Schriever M.D on 02/11/2019 at 1:53 PM  Between 7am to 6pm - Pager - [phone]  After 6pm go to [email or web address] -  Proofreader  [facility] [place]  Office  [phone]  CC: Primary care physician; [name], [name], MD   Note: This dictation was prepared with Dragon dictation along with smaller phrase technology. Any transcriptional errors that result from this process are unintentional.

## 2019-02-11 NOTE — Progress Notes (Signed)
Pt will be discharge to [facility] with PICC line.

## 2019-02-11 NOTE — TOC Transition Note (Signed)
Transition of Care [facility]) - CM/SW Discharge Note   Patient Details  Name: Yesenia Shepard MRN: [id number] Date of Birth: 01/18/1951  Transition of Care [facility]) CM/SW Contact:  Candie Chroman, LCSW Phone Number: 02/11/2019, 2:27 PM   Clinical Narrative: Patient has orders to discharge back to [facility] today. Insurance authorization approved and wound vac has been delivered to the facility. RN will call EMS to transport once picc line has been placed. Patient and sister aware. RN will call report to [phone]. No further concerns. CSW signing off.    Final next level of care: Skilled Nursing Facility Barriers to Discharge: Barriers Resolved   Patient Goals and CMS Choice        Discharge Placement   Existing PASRR number confirmed : 01/29/19          Patient chooses bed at: [facility] Patient to be transferred to facility by: EMS Name of family member notified: Jeannine Boga Patient and family notified of of transfer: 02/11/19  Discharge Plan and Services                                     Social Determinants of Health (SDOH) Interventions     Readmission Risk Interventions Readmission Risk Prevention Plan 01/30/2019  Transportation Screening Complete  Medication Review Press photographer) Complete  HRI or [place] Complete  SW Recovery Care/Counseling Consult Complete  Palliative Care Screening Not Applicable  Skilled Nursing Facility Complete  Some recent data might be hidden

## 2019-02-11 NOTE — Progress Notes (Signed)
Received order for PICC  

## 2019-02-11 NOTE — Progress Notes (Signed)
ID Pt with no specific issues- says she wants to sleep  Patient Vitals for the past 24 hrs:  BP Temp Temp src Pulse Resp SpO2  02/11/19 0637 (!) 153/68 98.6 F (37 C) Oral 70 18 96 %  02/10/19 2332 (!) 142/66 98.6 F (37 C) Oral 63 18 97 %   Pale bruising arms Sacral decub has wound vac  CBC Latest Ref Rng & Units 02/08/2019 02/07/2019 02/06/2019  WBC 4.0 - 10.5 K/uL 11.2(H) 11.4(H) 15.4(H)  Hemoglobin 12.0 - 15.0 g/dL 7.8(L) 7.7(L) 7.4(L)  Hematocrit 36.0 - 46.0 % 25.3(L) 25.2(L) 24.4(L)  Platelets 150 - 400 K/uL 494(H) 435(H) 450(H)     CMP Latest Ref Rng & Units 02/11/2019 02/10/2019 02/09/2019  Glucose 70 - 99 mg/dL 122(H) 157(H) 109(H)  BUN 8 - 23 mg/dL 11 14 14   Creatinine 0.44 - 1.00 mg/dL 0.86 0.83 0.92  Sodium 135 - 145 mmol/L 145 141 142  Potassium 3.5 - 5.1 mmol/L 4.1 3.6 3.5  Chloride 98 - 111 mmol/L 110 111 111  CO2 22 - 32 mmol/L 26 26 27   Calcium 8.9 - 10.3 mg/dL 8.1(L) 7.5(L) 7.5(L)  Total Protein 6.5 - 8.1 g/dL - - -  Total Bilirubin 0.3 - 1.2 mg/dL - - -  Alkaline Phos 38 - 126 U/L - - -  AST 15 - 41 U/L - - -  ALT 0 - 44 U/L - - -    Bone culture FEW VANCOMYCIN RESISTANT ENTEROCOCCUS ISOLATED  FEW PREVOTELLA SPECIES  FEW BACTEROIDES OVATUS  ORGANISM 2 BETA LACTAMASE POSITIVE  ORGANISM 3 BETA LACTAMASE NEGATIVE  Impression/Recommendation   Stage  IV sacral decub-with sacral osteomyelitis s/p debridement- VRE in the bone culture- but is sensitive to ampicillin - so will DC linezolid and continue Unasyn as there are multiple anerobes in the culture as well Will need IV until 03/08/19. May need PO augmentin for 2 weeks  following that   PAD   AKI resolved  Leucocytosis- much improved   Anemia chronic with recent drop -both IDA and low folic acid managed by hospitalist- had received 1 unit PRBC- IV iron Folic acid low and is being replaced  Anxiety/depression- on lexapro- low dose - watch closely for serotonin syndrome because of  linezolid  Chronic pain syndrome on methadone ___________________________________________________ Discussed with Dr.Ojie

## 2019-02-11 NOTE — Progress Notes (Signed)
Pharmacy Electrolyte Monitoring Consult:  Pharmacy consulted to assist in monitoring and replacing electrolytes in this 68 y.o. female admitted on 01/28/2019 with Hypotension   Labs:  Sodium (mmol/L)  Date Value  02/11/2019 145  02/04/2015 139   Potassium (mmol/L)  Date Value  02/11/2019 4.1   Magnesium (mg/dL)  Date Value  02/09/2019 2.0   Phosphorus (mg/dL)  Date Value  02/11/2019 3.1   Calcium (mg/dL)  Date Value  02/11/2019 8.1 (L)   Albumin (g/dL)  Date Value  01/28/2019 2.3 (L)   Corrected Ca: 8.86 mg/dL  Assessment/Plan:   No electrolyte supplementation required    Recheck labs in AM   Pharmacy will continue to follow.   Dallie Piles, PharmD 02/11/2019 7:44 AM

## 2019-02-11 NOTE — Progress Notes (Signed)
Per MD okay for RN to place order for PICC line. Pt will needed prior discharge. Plan is for pt to DC today to [facility] after PICC is place.

## 2019-02-12 ENCOUNTER — Telehealth: Payer: Self-pay | Admitting: Internal Medicine

## 2019-02-12 DIAGNOSIS — R279 Unspecified lack of coordination: Secondary | ICD-10-CM | POA: Diagnosis not present

## 2019-02-12 DIAGNOSIS — E785 Hyperlipidemia, unspecified: Secondary | ICD-10-CM | POA: Diagnosis not present

## 2019-02-12 DIAGNOSIS — G4700 Insomnia, unspecified: Secondary | ICD-10-CM | POA: Diagnosis not present

## 2019-02-12 DIAGNOSIS — R2681 Unsteadiness on feet: Secondary | ICD-10-CM | POA: Diagnosis not present

## 2019-02-12 DIAGNOSIS — L89150 Pressure ulcer of sacral region, unstageable: Secondary | ICD-10-CM | POA: Diagnosis not present

## 2019-02-12 DIAGNOSIS — L97423 Non-pressure chronic ulcer of left heel and midfoot with necrosis of muscle: Secondary | ICD-10-CM | POA: Diagnosis not present

## 2019-02-12 DIAGNOSIS — L89159 Pressure ulcer of sacral region, unspecified stage: Secondary | ICD-10-CM | POA: Diagnosis not present

## 2019-02-12 DIAGNOSIS — D649 Anemia, unspecified: Secondary | ICD-10-CM | POA: Diagnosis not present

## 2019-02-12 DIAGNOSIS — R1312 Dysphagia, oropharyngeal phase: Secondary | ICD-10-CM | POA: Diagnosis not present

## 2019-02-12 DIAGNOSIS — N184 Chronic kidney disease, stage 4 (severe): Secondary | ICD-10-CM | POA: Diagnosis not present

## 2019-02-12 DIAGNOSIS — Z872 Personal history of diseases of the skin and subcutaneous tissue: Secondary | ICD-10-CM | POA: Diagnosis not present

## 2019-02-12 DIAGNOSIS — R52 Pain, unspecified: Secondary | ICD-10-CM | POA: Diagnosis not present

## 2019-02-12 DIAGNOSIS — R488 Other symbolic dysfunctions: Secondary | ICD-10-CM | POA: Diagnosis not present

## 2019-02-12 DIAGNOSIS — M6281 Muscle weakness (generalized): Secondary | ICD-10-CM | POA: Diagnosis not present

## 2019-02-12 DIAGNOSIS — F331 Major depressive disorder, recurrent, moderate: Secondary | ICD-10-CM | POA: Diagnosis not present

## 2019-02-12 DIAGNOSIS — R498 Other voice and resonance disorders: Secondary | ICD-10-CM | POA: Diagnosis not present

## 2019-02-12 DIAGNOSIS — L97529 Non-pressure chronic ulcer of other part of left foot with unspecified severity: Secondary | ICD-10-CM | POA: Diagnosis not present

## 2019-02-12 DIAGNOSIS — I10 Essential (primary) hypertension: Secondary | ICD-10-CM | POA: Diagnosis not present

## 2019-02-12 DIAGNOSIS — F419 Anxiety disorder, unspecified: Secondary | ICD-10-CM | POA: Diagnosis not present

## 2019-02-12 DIAGNOSIS — F1920 Other psychoactive substance dependence, uncomplicated: Secondary | ICD-10-CM | POA: Diagnosis not present

## 2019-02-12 DIAGNOSIS — L89899 Pressure ulcer of other site, unspecified stage: Secondary | ICD-10-CM | POA: Diagnosis not present

## 2019-02-12 DIAGNOSIS — F418 Other specified anxiety disorders: Secondary | ICD-10-CM | POA: Diagnosis not present

## 2019-02-12 DIAGNOSIS — M2550 Pain in unspecified joint: Secondary | ICD-10-CM | POA: Diagnosis not present

## 2019-02-12 DIAGNOSIS — A419 Sepsis, unspecified organism: Secondary | ICD-10-CM | POA: Diagnosis not present

## 2019-02-12 DIAGNOSIS — L89120 Pressure ulcer of left upper back, unstageable: Secondary | ICD-10-CM | POA: Diagnosis not present

## 2019-02-12 DIAGNOSIS — Z743 Need for continuous supervision: Secondary | ICD-10-CM | POA: Diagnosis not present

## 2019-02-12 DIAGNOSIS — R262 Difficulty in walking, not elsewhere classified: Secondary | ICD-10-CM | POA: Diagnosis not present

## 2019-02-12 DIAGNOSIS — K7460 Unspecified cirrhosis of liver: Secondary | ICD-10-CM | POA: Diagnosis not present

## 2019-02-12 DIAGNOSIS — F0150 Vascular dementia without behavioral disturbance: Secondary | ICD-10-CM | POA: Diagnosis not present

## 2019-02-12 DIAGNOSIS — G8929 Other chronic pain: Secondary | ICD-10-CM | POA: Diagnosis not present

## 2019-02-12 DIAGNOSIS — L89154 Pressure ulcer of sacral region, stage 4: Secondary | ICD-10-CM | POA: Diagnosis not present

## 2019-02-12 DIAGNOSIS — E7800 Pure hypercholesterolemia, unspecified: Secondary | ICD-10-CM | POA: Diagnosis not present

## 2019-02-12 DIAGNOSIS — A408 Other streptococcal sepsis: Secondary | ICD-10-CM | POA: Diagnosis not present

## 2019-02-12 DIAGNOSIS — Z8719 Personal history of other diseases of the digestive system: Secondary | ICD-10-CM | POA: Diagnosis not present

## 2019-02-12 DIAGNOSIS — E1122 Type 2 diabetes mellitus with diabetic chronic kidney disease: Secondary | ICD-10-CM | POA: Diagnosis not present

## 2019-02-12 DIAGNOSIS — L89620 Pressure ulcer of left heel, unstageable: Secondary | ICD-10-CM | POA: Diagnosis not present

## 2019-02-12 DIAGNOSIS — R531 Weakness: Secondary | ICD-10-CM | POA: Diagnosis present

## 2019-02-12 DIAGNOSIS — Z23 Encounter for immunization: Secondary | ICD-10-CM | POA: Diagnosis not present

## 2019-02-12 DIAGNOSIS — L8995 Pressure ulcer of unspecified site, unstageable: Secondary | ICD-10-CM | POA: Diagnosis not present

## 2019-02-12 DIAGNOSIS — L97422 Non-pressure chronic ulcer of left heel and midfoot with fat layer exposed: Secondary | ICD-10-CM | POA: Diagnosis not present

## 2019-02-12 DIAGNOSIS — E46 Unspecified protein-calorie malnutrition: Secondary | ICD-10-CM | POA: Diagnosis not present

## 2019-02-12 LAB — GLUCOSE, CAPILLARY: Glucose-Capillary: 108 mg/dL — ABNORMAL HIGH (ref 70–99)

## 2019-02-12 LAB — PHOSPHORUS: Phosphorus: 3.3 mg/dL (ref 2.5–4.6)

## 2019-02-12 MED ORDER — AMPICILLIN-SULBACTAM IV (FOR PTA / DISCHARGE USE ONLY): 3.0000 g | Freq: Four times a day (QID) | INTRAVENOUS | 0 refills | Status: AC

## 2019-02-12 NOTE — Progress Notes (Signed)
Pt is being dc to [facility], EMS here to transport pt. Dressings have been changed.

## 2019-02-12 NOTE — Progress Notes (Signed)
Report given to [place] at [employer].

## 2019-02-12 NOTE — Progress Notes (Signed)
Patient found with bloody PICC line and PICC dressing saturated in blood. RN drew back 10 ml of blood and wasted. RN flushed PICC line with 2 flushes of saline and changed PICC dressing. Patient now resting quietly in bed. Will continue to monitor.

## 2019-02-12 NOTE — Telephone Encounter (Signed)
Pt is being released from hospital and going home. Pt was admitted for weakness, kidneys and wound. Pt needs a hospital follow up.

## 2019-02-12 NOTE — Discharge Summary (Signed)
[facility] [place] NAME: Yesenia Shepard    MR#:  [id number]  DATE OF BIRTH:  11-Jan-1951  DATE OF ADMISSION:  01/28/2019   ADMITTING PHYSICIAN: Epifanio Lesches, MD  DATE OF DISCHARGE: 02/12/2019 10:40 AM  PRIMARY CARE PHYSICIAN: [name], [name], MD   ADMISSION DIAGNOSIS:  Weakness [R53.1] Acute cystitis with hematuria [N30.01] AKI (acute kidney injury) ([place]) [N17.9] Pressure injury of sacral region, unstageable ([place]) [L89.150] DISCHARGE DIAGNOSIS:  Active Problems:   Decubitus ulcer, infected  SECONDARY DIAGNOSIS:   Past Medical History:  Diagnosis Date   Anxiety    Arthritis    Chronic pain    Depression    Diabetes mellitus without complication ([place])    Drug abuse ([place])    History of polysubstance abuse; currently on methadone.   Hepatitis    History of Hep "C". treated and cured with Harvoni   History of chicken pox    Hypertension    Panic attacks    Peripheral vascular disease ([place])    stent in place.    UTI (urinary tract infection)    History of   HOSPITAL COURSE:  This is an addendum to recently dictated discharge summary.  Patient was discharged yesterday.  I was notified patient could not be transported to the nursing facility yesterday.  No issues reported overnight.  This morning patient being discharged to nursing facility.  Please see below for further details.   Chief complaint; hypertension   History of presenting complaint; JanHoyleis a67 y.o.femalewith a known history of hepatitis C, chronic pain syndrome, depression comes from [facility] nursing home secondary to hypotension, greenish-brown discharge from sacral decubiti. Patient recently was given Levaquin for UTI. Patient history obtained from ER charts as she is lethargic. Patient is hypotensive, found to have UTI, infected sacral decubiti.  Patient was admitted to medical service for further evaluation and  management.   Hospital course; 1. Sepsis: Patient presented with hypotension, leukocytosis. Sepsis is due to infected sacral decubitus ulcer.  Patient was initially treated with IV cefepime and subsequently changed to IV Unasyn.Patient was also treated with IV linezolid due to VRE in blood culture during this admission.  This is also sensitive to ampicillin.  I discussed extensively with infectious disease specialist Dr. Ramon Dredge today and she recommended discharging patient on IV Unasyn until 03/08/2019.  She has suspected patient may need p.o. Augmentin for 2 weeks following that.  I made an appointment to follow-up with post discharge from the hospital and she will make determination if Augmentin is needed at that time.  PICC line placed yesterday.  Patient to complete IV Unasyn as mentioned above.  S/p sacral DU debridement.S/P wound VAC placement.Resumed Plavix.  Surgical service has signed off.  Nursing facility to continue managing wound with wound VAC on discharge.  2. Sacral decubitus ulcer: As above. 3. Acute on chronic anemia: Anemia panel is consistent with iron deficiency.  Patient is status post 1 unit PRBC due to drop in hemoglobin.  She also received IV iron. . Hemoglobin stable at 7.8 recently. 4. Protein calorie malnutrition: Continue nutritional supplements  5. Depression: Patient is on Lexapro and clonazepam as needed  6. Chronic pain syndrome on chronic methadone CKD stage III. Stable.  7 Acute renal failure due to dehydration. Improved with IV fluid support.  8. Hypophosphatemia. Replaced  I have updated patient's sister listed in the chart on discharge plans for later today.  All questions were answered and she is  in agreement to the plan of care as outlined.  DISCHARGE CONDITIONS:  Stable CONSULTS OBTAINED:   DRUG ALLERGIES:  No Known Allergies DISCHARGE MEDICATIONS:   Allergies as of 02/12/2019   No Known Allergies     Medication  List    STOP taking these medications   lactulose 10 GM/15ML solution Commonly known as: CHRONULAC   levofloxacin 500 MG tablet Commonly known as: LEVAQUIN   LORazepam 0.5 MG tablet Commonly known as: ATIVAN   potassium chloride SA 20 MEQ tablet Commonly known as: K-DUR   traMADol 50 MG tablet Commonly known as: ULTRAM   traZODone 50 MG tablet Commonly known as: DESYREL     TAKE these medications   acetaminophen 325 MG tablet Commonly known as: TYLENOL Take 2 tablets (650 mg total) by mouth every 6 (six) hours as needed for mild pain (or Fever >/= 101).   ampicillin-sulbactam  IVPB Commonly known as: UNASYN Inject 3 g into the vein every 6 (six) hours for 25 days. 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   ascorbic acid 250 MG tablet Commonly known as: VITAMIN C Take 1 tablet (250 mg total) by mouth 2 (two) times daily.   atorvastatin 40 MG tablet Commonly known as: LIPITOR Take 1 tablet (40 mg total) by mouth daily.   bisacodyl 5 MG EC tablet Commonly known as: DULCOLAX Take 1 tablet (5 mg total) by mouth daily as needed for moderate constipation.   cholecalciferol 25 MCG (1000 UT) tablet Commonly known as: VITAMIN D3 Take 1 tablet (1,000 Units total) by mouth daily.   clonazepam 0.125 MG disintegrating tablet Commonly known as: KLONOPIN Take 0.125 mg by mouth every 8 (eight) hours as needed (anxiety). What changed: Another medication with the same name was removed. Continue taking this medication, and follow the directions you see here.   clopidogrel 75 MG tablet Commonly known as: PLAVIX TAKE 1 TABLET BY MOUTH ONCE DAILY   docusate sodium 100 MG capsule Commonly known as: COLACE Take 1 capsule (100 mg  total) by mouth 2 (two) times daily. What changed:   how much to take  when to take this  reasons to take this   escitalopram 5 MG tablet Commonly known as: LEXAPRO Take 1 tablet (5 mg total) by mouth daily.   famotidine 20 MG tablet Commonly known as: PEPCID Take 1 tablet (20 mg total) by mouth daily.   feeding supplement (GLUCERNA SHAKE) Liqd Take 237 mLs by mouth 2 (two) times daily between meals.   ferrous sulfate 325 (65 FE) MG tablet Take 325 mg by mouth 2 (two) times daily.   HYDROcodone-acetaminophen 5-325 MG tablet Commonly known as: NORCO/VICODIN Take 1 tablet by mouth every 6 (six) hours as needed for moderate pain.   lisinopril 10 MG tablet Commonly known as: ZESTRIL Take 1 tablet (10 mg total) by mouth daily.   Melatonin 5 MG Tabs Take 5 mg by mouth at bedtime.   methadone 10 MG/ML solution Commonly known as: DOLOPHINE Take 6.5 mLs (65 mg total) by mouth daily for 4 days. What changed: how much to take   multivitamins with iron Tabs tablet Take 1 tablet by mouth daily.   polyethylene glycol 17 g packet Commonly known as:  MIRALAX / GLYCOLAX Take 17 g by mouth daily as needed for severe constipation. What changed: when to take this            Home Infusion Instuctions  (From admission, onward)         Start     Ordered   02/12/19 0000  Home infusion instructions [facility] May follow [facility] Dosing Protocol; May administer Cathflo as needed to maintain patency of vascular access device.; Flushing of vascular access device: per [facility] Protocol: 0.9% NaCl pre/post medica...    Question Answer Comment  Instructions May follow [place] Dosing Protocol   Instructions May administer Cathflo as needed to maintain patency of vascular access device.   Instructions Flushing of vascular access device: per [facility] Protocol: 0.9% NaCl pre/post medication administration and prn patency; Heparin 100 u/ml, 67m for implanted ports and Heparin 10u/ml,  543mfor all other central venous catheters.   Instructions May follow [facility] Anaphylaxis Protocol for First Dose Administration in the home: 0.9% NaCl at 25-50 ml/hr to maintain IV access for protocol meds. Epinephrine 0.3 ml IV/IM PRN and Benadryl 25-50 IV/IM PRN s/s of anaphylaxis.   Instructions [facility] Infusion Coordinator (RN) to assist per patient IV care needs in the home PRN.      02/12/19 1156           DISCHARGE INSTRUCTIONS:   DIET:  Cardiac diet DISCHARGE CONDITION:  Stable ACTIVITY:  Activity as tolerated OXYGEN:  Home Oxygen: No.  Oxygen Delivery: room air DISCHARGE LOCATION:  nursing home   If you experience worsening of your admission symptoms, develop shortness of breath, life threatening emergency, suicidal or homicidal thoughts you must seek medical attention immediately by calling 911 or calling your MD immediately  if symptoms less severe.  You Must read complete instructions/literature along with all the possible adverse reactions/side effects for all the Medicines you take and that have been prescribed to you. Take any new Medicines after you have completely understood and accpet all the possible adverse reactions/side effects.   Please note  You were cared for by a hospitalist during your hospital stay. If you have any questions about your discharge medications or the care you received while you were in the hospital after you are discharged, you can call the unit and asked to speak with the hospitalist on call if the hospitalist that took care of you is not available. Once you are discharged, your primary care physician will handle any further medical issues. Please note that NO REFILLS for any discharge medications will be authorized once you are discharged, as it is imperative that you return to your primary care physician (or establish a relationship with a primary care physician if you do not have one) for your aftercare needs so that they can  reassess your need for medications and monitor your lab values.    On the day of Discharge:  VITAL SIGNS:  Blood pressure (!) 141/76, pulse 84, temperature 97.7 F (36.5 C), temperature source Oral, resp. rate 20, height _0  (1.676 m), weight 53 kg, SpO2 94 %. PHYSICAL EXAMINATION:  GENERAL:  89.o.-year-old patient lying in the bed with no acute distress.  EYES: Pupils equal, round, reactive to light and accommodation. No scleral icterus. Extraocular muscles intact.  HEENT: Head atraumatic, normocephalic. Oropharynx and nasopharynx clear.  NECK:  Supple, no jugular venous distention. No thyroid enlargement, no tenderness.  LUNGS: Normal breath sounds bilaterally, no wheezing, rales,rhonchi or crepitation. No use  of accessory muscles of respiration.  CARDIOVASCULAR: S1, S2 normal. No murmurs, rubs, or gallops.  ABDOMEN: Soft, non-tender, non-distended. Bowel sounds present. No organomegaly or mass.  EXTREMITIES: No pedal edema, cyanosis, or clubbing.  NEUROLOGIC: Cranial nerves II through XII are intact. Muscle strength 5/5 in all extremities. Sensation intact. Gait not checked.  PSYCHIATRIC: The patient is alert and oriented x 3.  SKIN:Stage IV sacral decub ulcer with wound VAC in place. Marland Kitchen  DATA REVIEW:   CBC Recent Labs  Lab 02/08/19 0436  WBC 11.2*  HGB 7.8*  HCT 25.3*  PLT 494*    Chemistries  Recent Labs  Lab 02/09/19 0536  02/11/19 0656  NA 142   < > 145  K 3.5   < > 4.1  CL 111   < > 110  CO2 27   < > 26  GLUCOSE 109*   < > 122*  BUN 14   < > 11  CREATININE 0.92   < > 0.86  CALCIUM 7.5*   < > 8.1*  MG 2.0  --   --    < > = values in this interval not displayed.     Microbiology Results  Results for orders placed or performed during the hospital encounter of 01/28/19  SARS Coronavirus 2 (CEPHEID - Performed in [facility] hospital lab), Hosp Order     Status: None   Collection Time: 01/28/19 11:14 AM   Specimen: Nasopharyngeal Swab  Result Value Ref  Range Status   SARS Coronavirus 2 NEGATIVE NEGATIVE Final    Comment: (NOTE) If result is NEGATIVE SARS-CoV-2 target nucleic acids are NOT DETECTED. The SARS-CoV-2 RNA is generally detectable in upper and lower  respiratory specimens during the acute phase of infection. The lowest  concentration of SARS-CoV-2 viral copies this assay can detect is 250  copies / mL. A negative result does not preclude SARS-CoV-2 infection  and should not be used as the sole basis for treatment or other  patient management decisions.  A negative result may occur with  improper specimen collection / handling, submission of specimen other  than nasopharyngeal swab, presence of viral mutation(s) within the  areas targeted by this assay, and inadequate number of viral copies  (<250 copies / mL). A negative result must be combined with clinical  observations, patient history, and epidemiological information. If result is POSITIVE SARS-CoV-2 target nucleic acids are DETECTED. The SARS-CoV-2 RNA is generally detectable in upper and lower  respiratory specimens dur ing the acute phase of infection.  Positive  results are indicative of active infection with SARS-CoV-2.  Clinical  correlation with patient history and other diagnostic information is  necessary to determine patient infection status.  Positive results do  not rule out bacterial infection or co-infection with other viruses. If result is PRESUMPTIVE POSTIVE SARS-CoV-2 nucleic acids MAY BE PRESENT.   A presumptive positive result was obtained on the submitted specimen  and confirmed on repeat testing.  While 2019 novel coronavirus  (SARS-CoV-2) nucleic acids may be present in the submitted sample  additional confirmatory testing may be necessary for epidemiological  and / or clinical management purposes  to differentiate between  SARS-CoV-2 and other Sarbecovirus currently known to infect humans.  If clinically indicated additional testing with an  alternate test  methodology [phone]) is advised. The SARS-CoV-2 RNA is generally  detectable in upper and lower respiratory sp ecimens during the acute  phase of infection. The expected result is Negative. Fact Sheet for Patients:  StrictlyIdeas.no Fact Sheet for Healthcare Providers: BankingDealers.co.za This test is not yet approved or cleared by the [place] FDA and has been authorized for detection and/or diagnosis of SARS-CoV-2 by FDA under an Emergency Use Authorization (EUA).  This EUA will remain in effect (meaning this test can be used) for the duration of the COVID-19 declaration under Section 564(b)(1) of the Act, 21 U.S.C. section 360bbb-3(b)(1), unless the authorization is terminated or revoked sooner. Performed at [facility], [address] [place], [place] [address]   Urine culture     Status: Abnormal   Collection Time: 01/28/19 11:14 AM   Specimen: Urine, Clean Catch  Result Value Ref Range Status   Specimen Description   Final    URINE, CLEAN CATCH Performed at [facility], [address], [place], [place] [address]    Special Requests   Final    NONE Performed at [facility], [address] [place], [place] [address]    Culture MULTIPLE SPECIES PRESENT, SUGGEST RECOLLECTION (A)  Final   Report Status 01/29/2019 FINAL  Final  Blood culture (routine x 2)     Status: None   Collection Time: 01/28/19 12:20 PM   Specimen: BLOOD  Result Value Ref Range Status   Specimen Description BLOOD LEFT ANTECUBITAL  Final   Special Requests   Final    BOTTLES DRAWN AEROBIC AND ANAEROBIC Blood Culture adequate volume   Culture   Final    NO GROWTH 5 DAYS Performed at [facility], [address] [place], [place] [address]    Report Status 02/02/2019 FINAL  Final  Blood culture (routine x 2)     Status: None   Collection Time: 01/28/19 12:20 PM   Specimen: BLOOD  Result  Value Ref Range Status   Specimen Description BLOOD RIGHT ANTECUBITAL  Final   Special Requests   Final    BOTTLES DRAWN AEROBIC AND ANAEROBIC Blood Culture adequate volume   Culture   Final    NO GROWTH 5 DAYS Performed at [facility], [address] [place], [place] [address]    Report Status 02/02/2019 FINAL  Final  MRSA PCR Screening     Status: None   Collection Time: 01/29/19  3:26 AM   Specimen: Nasopharyngeal  Result Value Ref Range Status   MRSA by PCR NEGATIVE NEGATIVE Final    Comment:        The GeneXpert MRSA Assay (FDA approved for NASAL specimens only), is one component of a comprehensive MRSA colonization surveillance program. It is not intended to diagnose MRSA infection nor to guide or monitor treatment for MRSA infections. Performed at [facility], [address], [place], [place] [address]   Aerobic/Anaerobic Culture (surgical/deep wound)     Status: None   Collection Time: 02/03/19  2:24 PM   Specimen: [facility] Bone biopsy; Tissue  Result Value Ref Range Status   Specimen Description   Final    BIOPSY Performed at [facility], [address], [place],  [address]    Special Requests   Final    BONE CULTURE SACRAL DECUBITUS Performed at [facility], [address] [place], [place] [address]    Gram Stain   Final    MODERATE GRAM NEGATIVE RODS MODERATE GRAM POSITIVE COCCI IN PAIRS FEW GRAM POSITIVE RODS NO WBC SEEN    Culture   Final    FEW VANCOMYCIN RESISTANT ENTEROCOCCUS ISOLATED FEW PREVOTELLA SPECIES FEW BACTEROIDES OVATUS ORGANISM 2 BETA  LACTAMASE POSITIVE ORGANISM 3 BETA LACTAMASE NEGATIVE Performed at [facility], [place] [address] [place], [place] [address]    Report Status 02/10/2019 FINAL  Final   Organism ID, Bacteria VANCOMYCIN RESISTANT ENTEROCOCCUS ISOLATED  Final      Susceptibility   Vancomycin resistant enterococcus isolated - MIC*    AMPICILLIN <=2 SENSITIVE Sensitive      VANCOMYCIN >=32 RESISTANT Resistant     GENTAMICIN SYNERGY RESISTANT Resistant     LINEZOLID 2 SENSITIVE Sensitive     * FEW VANCOMYCIN RESISTANT ENTEROCOCCUS ISOLATED  SARS Coronavirus 2 [facility] order, Performed in [facility] hospital lab)     Status: None   Collection Time: 02/11/19  9:01 AM  Result Value Ref Range Status   SARS Coronavirus 2 NEGATIVE NEGATIVE Final    Comment: (NOTE) If result is NEGATIVE SARS-CoV-2 target nucleic acids are NOT DETECTED. The SARS-CoV-2 RNA is generally detectable in upper and lower  respiratory specimens during the acute phase of infection. The lowest  concentration of SARS-CoV-2 viral copies this assay can detect is 250  copies / mL. A negative result does not preclude SARS-CoV-2 infection  and should not be used as the sole basis for treatment or other  patient management decisions.  A negative result may occur with  improper specimen collection / handling, submission of specimen other  than nasopharyngeal swab, presence of viral mutation(s) within the  areas targeted by this assay, and inadequate number of viral copies  (<250 copies / mL). A negative result must be combined with clinical  observations, patient history, and epidemiological information. If result is POSITIVE SARS-CoV-2 target nucleic acids are DETECTED. The SARS-CoV-2 RNA is generally detectable in upper and lower  respiratory specimens dur ing the acute phase of infection.  Positive  results are indicative of active infection with SARS-CoV-2.  Clinical  correlation with patient history and other diagnostic information is  necessary to determine patient infection status.  Positive results do  not rule out bacterial infection or co-infection with other viruses. If result is PRESUMPTIVE POSTIVE SARS-CoV-2 nucleic acids MAY BE PRESENT.   A presumptive positive result was obtained on the submitted specimen  and confirmed on repeat testing.  While 2019 novel coronavirus    (SARS-CoV-2) nucleic acids may be present in the submitted sample  additional confirmatory testing may be necessary for epidemiological  and / or clinical management purposes  to differentiate between  SARS-CoV-2 and other Sarbecovirus currently known to infect humans.  If clinically indicated additional testing with an alternate test  methodology [phone]) is advised. The SARS-CoV-2 RNA is generally  detectable in upper and lower respiratory sp ecimens during the acute  phase of infection. The expected result is Negative. Fact Sheet for Patients:  StrictlyIdeas.no Fact Sheet for Healthcare Providers: BankingDealers.co.za This test is not yet approved or cleared by the [place] FDA and has been authorized for detection and/or diagnosis of SARS-CoV-2 by FDA under an Emergency Use Authorization (EUA).  This EUA will remain in effect (meaning this test can be used) for the duration of the COVID-19 declaration under Section 564(b)(1) of the Act, 21 U.S.C. section 360bbb-3(b)(1), unless the authorization is terminated or revoked sooner. Performed at [facility], [address] [place], [place] [address]     RADIOLOGY:  No results found.   Management plans discussed with the patient, family and they are in agreement.  CODE STATUS: DNR   TOTAL TIME TAKING CARE OF THIS PATIENT: 34 minutes.  Tywon Niday M.D on 02/12/2019 at 12:06 PM  Between 7am to 6pm - Pager - [phone]  After 6pm go to [email or web address] - Proofreader  [facility]  Office  [phone]  CC: Primary care physician; [name], [name], MD   Note: This dictation was prepared with Dragon dictation along with smaller phrase technology. Any transcriptional errors that result from this process are unintentional.

## 2019-02-13 ENCOUNTER — Telehealth: Payer: Self-pay

## 2019-02-13 DIAGNOSIS — Z872 Personal history of diseases of the skin and subcutaneous tissue: Secondary | ICD-10-CM | POA: Diagnosis not present

## 2019-02-13 DIAGNOSIS — L97422 Non-pressure chronic ulcer of left heel and midfoot with fat layer exposed: Secondary | ICD-10-CM | POA: Diagnosis not present

## 2019-02-13 DIAGNOSIS — L89154 Pressure ulcer of sacral region, stage 4: Secondary | ICD-10-CM | POA: Diagnosis not present

## 2019-02-13 DIAGNOSIS — L97529 Non-pressure chronic ulcer of other part of left foot with unspecified severity: Secondary | ICD-10-CM | POA: Diagnosis not present

## 2019-02-13 NOTE — Telephone Encounter (Signed)
Spoke with sister Coralyn Mark who is listed as a point of contact. Sister states patient is discharged back to [facility] and will not be returning home. Unable to complete transitional care management.

## 2019-02-15 DIAGNOSIS — A419 Sepsis, unspecified organism: Secondary | ICD-10-CM | POA: Diagnosis not present

## 2019-02-15 DIAGNOSIS — F418 Other specified anxiety disorders: Secondary | ICD-10-CM | POA: Diagnosis not present

## 2019-02-15 DIAGNOSIS — L89154 Pressure ulcer of sacral region, stage 4: Secondary | ICD-10-CM | POA: Diagnosis not present

## 2019-02-15 DIAGNOSIS — D649 Anemia, unspecified: Secondary | ICD-10-CM | POA: Diagnosis not present

## 2019-02-15 DIAGNOSIS — E785 Hyperlipidemia, unspecified: Secondary | ICD-10-CM | POA: Diagnosis not present

## 2019-02-15 DIAGNOSIS — I10 Essential (primary) hypertension: Secondary | ICD-10-CM | POA: Diagnosis not present

## 2019-02-15 DIAGNOSIS — Z8719 Personal history of other diseases of the digestive system: Secondary | ICD-10-CM | POA: Diagnosis not present

## 2019-02-15 DIAGNOSIS — G8929 Other chronic pain: Secondary | ICD-10-CM | POA: Diagnosis not present

## 2019-02-16 DIAGNOSIS — G4700 Insomnia, unspecified: Secondary | ICD-10-CM | POA: Diagnosis not present

## 2019-02-16 DIAGNOSIS — F331 Major depressive disorder, recurrent, moderate: Secondary | ICD-10-CM | POA: Diagnosis not present

## 2019-02-16 DIAGNOSIS — F419 Anxiety disorder, unspecified: Secondary | ICD-10-CM | POA: Diagnosis not present

## 2019-02-16 DIAGNOSIS — E1122 Type 2 diabetes mellitus with diabetic chronic kidney disease: Secondary | ICD-10-CM | POA: Diagnosis not present

## 2019-02-16 DIAGNOSIS — F0150 Vascular dementia without behavioral disturbance: Secondary | ICD-10-CM | POA: Diagnosis not present

## 2019-02-16 DIAGNOSIS — K7460 Unspecified cirrhosis of liver: Secondary | ICD-10-CM | POA: Diagnosis not present

## 2019-02-16 DIAGNOSIS — N184 Chronic kidney disease, stage 4 (severe): Secondary | ICD-10-CM | POA: Diagnosis not present

## 2019-02-16 DIAGNOSIS — F1920 Other psychoactive substance dependence, uncomplicated: Secondary | ICD-10-CM | POA: Diagnosis not present

## 2019-02-20 DIAGNOSIS — L97423 Non-pressure chronic ulcer of left heel and midfoot with necrosis of muscle: Secondary | ICD-10-CM | POA: Diagnosis not present

## 2019-02-20 DIAGNOSIS — L89620 Pressure ulcer of left heel, unstageable: Secondary | ICD-10-CM | POA: Diagnosis not present

## 2019-02-20 DIAGNOSIS — L89154 Pressure ulcer of sacral region, stage 4: Secondary | ICD-10-CM | POA: Diagnosis not present

## 2019-02-20 DIAGNOSIS — Z872 Personal history of diseases of the skin and subcutaneous tissue: Secondary | ICD-10-CM | POA: Diagnosis not present

## 2019-02-24 DIAGNOSIS — I10 Essential (primary) hypertension: Secondary | ICD-10-CM | POA: Diagnosis not present

## 2019-02-24 DIAGNOSIS — D649 Anemia, unspecified: Secondary | ICD-10-CM | POA: Diagnosis not present

## 2019-02-24 DIAGNOSIS — L89154 Pressure ulcer of sacral region, stage 4: Secondary | ICD-10-CM | POA: Diagnosis not present

## 2019-02-24 DIAGNOSIS — G8929 Other chronic pain: Secondary | ICD-10-CM | POA: Diagnosis not present

## 2019-02-24 DIAGNOSIS — F418 Other specified anxiety disorders: Secondary | ICD-10-CM | POA: Diagnosis not present

## 2019-02-24 DIAGNOSIS — Z8719 Personal history of other diseases of the digestive system: Secondary | ICD-10-CM | POA: Diagnosis not present

## 2019-02-24 DIAGNOSIS — A419 Sepsis, unspecified organism: Secondary | ICD-10-CM | POA: Diagnosis not present

## 2019-02-24 DIAGNOSIS — E785 Hyperlipidemia, unspecified: Secondary | ICD-10-CM | POA: Diagnosis not present

## 2019-02-27 DIAGNOSIS — R1312 Dysphagia, oropharyngeal phase: Secondary | ICD-10-CM | POA: Diagnosis not present

## 2019-02-27 DIAGNOSIS — L89154 Pressure ulcer of sacral region, stage 4: Secondary | ICD-10-CM | POA: Diagnosis not present

## 2019-02-27 DIAGNOSIS — L97423 Non-pressure chronic ulcer of left heel and midfoot with necrosis of muscle: Secondary | ICD-10-CM | POA: Diagnosis not present

## 2019-02-27 DIAGNOSIS — R2681 Unsteadiness on feet: Secondary | ICD-10-CM | POA: Diagnosis not present

## 2019-02-27 DIAGNOSIS — R488 Other symbolic dysfunctions: Secondary | ICD-10-CM | POA: Diagnosis not present

## 2019-02-27 DIAGNOSIS — R41841 Cognitive communication deficit: Secondary | ICD-10-CM | POA: Diagnosis not present

## 2019-02-27 DIAGNOSIS — M6281 Muscle weakness (generalized): Secondary | ICD-10-CM | POA: Diagnosis not present

## 2019-02-27 DIAGNOSIS — L89150 Pressure ulcer of sacral region, unstageable: Secondary | ICD-10-CM | POA: Diagnosis not present

## 2019-02-27 DIAGNOSIS — Z872 Personal history of diseases of the skin and subcutaneous tissue: Secondary | ICD-10-CM | POA: Diagnosis not present

## 2019-02-27 DIAGNOSIS — D649 Anemia, unspecified: Secondary | ICD-10-CM | POA: Diagnosis not present

## 2019-02-27 DIAGNOSIS — E7800 Pure hypercholesterolemia, unspecified: Secondary | ICD-10-CM | POA: Diagnosis not present

## 2019-02-27 DIAGNOSIS — M2550 Pain in unspecified joint: Secondary | ICD-10-CM | POA: Diagnosis not present

## 2019-02-27 DIAGNOSIS — L89620 Pressure ulcer of left heel, unstageable: Secondary | ICD-10-CM | POA: Diagnosis not present

## 2019-02-27 DIAGNOSIS — L89120 Pressure ulcer of left upper back, unstageable: Secondary | ICD-10-CM | POA: Diagnosis not present

## 2019-02-27 DIAGNOSIS — R262 Difficulty in walking, not elsewhere classified: Secondary | ICD-10-CM | POA: Diagnosis not present

## 2019-02-27 DIAGNOSIS — A408 Other streptococcal sepsis: Secondary | ICD-10-CM | POA: Diagnosis not present

## 2019-03-02 DIAGNOSIS — F331 Major depressive disorder, recurrent, moderate: Secondary | ICD-10-CM | POA: Diagnosis not present

## 2019-03-02 DIAGNOSIS — M6281 Muscle weakness (generalized): Secondary | ICD-10-CM | POA: Diagnosis not present

## 2019-03-02 DIAGNOSIS — F419 Anxiety disorder, unspecified: Secondary | ICD-10-CM | POA: Diagnosis not present

## 2019-03-02 DIAGNOSIS — F0150 Vascular dementia without behavioral disturbance: Secondary | ICD-10-CM | POA: Diagnosis not present

## 2019-03-02 DIAGNOSIS — E1122 Type 2 diabetes mellitus with diabetic chronic kidney disease: Secondary | ICD-10-CM | POA: Diagnosis not present

## 2019-03-02 DIAGNOSIS — R262 Difficulty in walking, not elsewhere classified: Secondary | ICD-10-CM | POA: Diagnosis not present

## 2019-03-02 DIAGNOSIS — R41841 Cognitive communication deficit: Secondary | ICD-10-CM | POA: Diagnosis not present

## 2019-03-02 DIAGNOSIS — L89120 Pressure ulcer of left upper back, unstageable: Secondary | ICD-10-CM | POA: Diagnosis not present

## 2019-03-02 DIAGNOSIS — N184 Chronic kidney disease, stage 4 (severe): Secondary | ICD-10-CM | POA: Diagnosis not present

## 2019-03-02 DIAGNOSIS — K7460 Unspecified cirrhosis of liver: Secondary | ICD-10-CM | POA: Diagnosis not present

## 2019-03-02 DIAGNOSIS — R488 Other symbolic dysfunctions: Secondary | ICD-10-CM | POA: Diagnosis not present

## 2019-03-02 DIAGNOSIS — L89154 Pressure ulcer of sacral region, stage 4: Secondary | ICD-10-CM | POA: Diagnosis not present

## 2019-03-02 DIAGNOSIS — G4700 Insomnia, unspecified: Secondary | ICD-10-CM | POA: Diagnosis not present

## 2019-03-02 DIAGNOSIS — L89150 Pressure ulcer of sacral region, unstageable: Secondary | ICD-10-CM | POA: Diagnosis not present

## 2019-03-02 DIAGNOSIS — R1312 Dysphagia, oropharyngeal phase: Secondary | ICD-10-CM | POA: Diagnosis not present

## 2019-03-02 DIAGNOSIS — F1920 Other psychoactive substance dependence, uncomplicated: Secondary | ICD-10-CM | POA: Diagnosis not present

## 2019-03-02 DIAGNOSIS — R2681 Unsteadiness on feet: Secondary | ICD-10-CM | POA: Diagnosis not present

## 2019-03-03 ENCOUNTER — Other Ambulatory Visit: Payer: Self-pay | Admitting: Family Medicine

## 2019-03-03 ENCOUNTER — Other Ambulatory Visit: Payer: Self-pay

## 2019-03-03 ENCOUNTER — Encounter: Payer: Self-pay | Admitting: Infectious Diseases

## 2019-03-03 ENCOUNTER — Ambulatory Visit: Payer: Medicare HMO | Attending: Infectious Diseases | Admitting: Infectious Diseases

## 2019-03-03 VITALS — BP 186/76 | HR 88 | Ht 66.0 in | Wt 138.0 lb

## 2019-03-03 DIAGNOSIS — E119 Type 2 diabetes mellitus without complications: Secondary | ICD-10-CM | POA: Diagnosis not present

## 2019-03-03 DIAGNOSIS — Z89422 Acquired absence of other left toe(s): Secondary | ICD-10-CM

## 2019-03-03 DIAGNOSIS — Z87891 Personal history of nicotine dependence: Secondary | ICD-10-CM

## 2019-03-03 DIAGNOSIS — R1312 Dysphagia, oropharyngeal phase: Secondary | ICD-10-CM | POA: Diagnosis not present

## 2019-03-03 DIAGNOSIS — L89154 Pressure ulcer of sacral region, stage 4: Secondary | ICD-10-CM

## 2019-03-03 DIAGNOSIS — L89120 Pressure ulcer of left upper back, unstageable: Secondary | ICD-10-CM | POA: Diagnosis not present

## 2019-03-03 DIAGNOSIS — M6281 Muscle weakness (generalized): Secondary | ICD-10-CM | POA: Diagnosis not present

## 2019-03-03 DIAGNOSIS — M4628 Osteomyelitis of vertebra, sacral and sacrococcygeal region: Secondary | ICD-10-CM

## 2019-03-03 DIAGNOSIS — R2681 Unsteadiness on feet: Secondary | ICD-10-CM | POA: Diagnosis not present

## 2019-03-03 DIAGNOSIS — R262 Difficulty in walking, not elsewhere classified: Secondary | ICD-10-CM | POA: Diagnosis not present

## 2019-03-03 DIAGNOSIS — I10 Essential (primary) hypertension: Secondary | ICD-10-CM

## 2019-03-03 DIAGNOSIS — Z8619 Personal history of other infectious and parasitic diseases: Secondary | ICD-10-CM | POA: Diagnosis not present

## 2019-03-03 DIAGNOSIS — G894 Chronic pain syndrome: Secondary | ICD-10-CM

## 2019-03-03 DIAGNOSIS — R488 Other symbolic dysfunctions: Secondary | ICD-10-CM | POA: Diagnosis not present

## 2019-03-03 DIAGNOSIS — L89150 Pressure ulcer of sacral region, unstageable: Secondary | ICD-10-CM | POA: Diagnosis not present

## 2019-03-03 DIAGNOSIS — R41841 Cognitive communication deficit: Secondary | ICD-10-CM | POA: Diagnosis not present

## 2019-03-03 NOTE — Progress Notes (Signed)
NAME: Yesenia Shepard  DOB: [birth year]  MRN: [id number]  Date/Time: 03/03/2019 10:12 AM   Subjective:  Follow-up visit after recent hospitalization.   ? Yesenia Shepard is a 68 y.o. female with a history of diabetes mellitus, treated hepatitis C, hypertension is here from the [facility] to follow-up on the stage IV sacral decubitus.  Patient was recently in the hospital between 01/28/2019 until 02/12/2023 sacral decubitus wound which was debrided by surgeon to clean granulating surface on 02/03/2019.  The wound biopsy showed osteomyelitis.  As the wound culture had VRE she was sent to the nursing home on IV Unasyn to complete 4 weeks followed by oral Augmentin.  Today she is here for follow-up.  Patient is a poor historian.  She states she has not had any fever.  She does have pain in the sacrum.  She is she still spends most of her time in bed or in a wheelchair.  She says she gets physical therapy but not sure how much. Before June patient was living at home.  She had a fall and she was admitted to the hospital since her discharge she has been in a nursing home. Past Medical History:  Diagnosis Date  . Anxiety   . Arthritis   . Chronic pain   . Depression   . Diabetes mellitus without complication ([place])   . Drug abuse ([place])    History of polysubstance abuse; currently on methadone.  . Hepatitis    History of Hep "C". treated and cured with Harvoni  . History of chicken pox   . Hypertension   . Panic attacks   . Peripheral vascular disease ([place])    stent in place.   Marland Kitchen UTI (urinary tract infection)    History of    Past Surgical History:  Procedure Laterality Date  . ABDOMINAL HYSTERECTOMY  1989  . AMPUTATION TOE Left 10/26/2018   Procedure: 5TH RAY RESCTION;  Surgeon: Albertine Patricia, DPM;  Location: [facility] ORS;  Service: Podiatry;  Laterality: Left;  . APPLICATION OF WOUND VAC  02/03/2019   Procedure: APPLICATION OF WOUND VAC;  Surgeon: Fredirick Maudlin, MD;  Location: [facility]  ORS;  Service: General;;  . APPLICATION OF WOUND VAC N/A 02/05/2019   Procedure: APPLICATION OF WOUND VAC POSS DEBRIDEMENT;  Surgeon: Fredirick Maudlin, MD;  Location: [facility] ORS;  Service: General;  Laterality: N/A;  . CHOLECYSTECTOMY  1987  . INTRACAPSULAR CATARACT EXTRACTION Left   . IRRIGATION AND DEBRIDEMENT ABSCESS N/A 02/03/2019   Procedure: IRRIGATION AND DEBRIDEMENT SACRAL DECUBITUS;  Surgeon: Fredirick Maudlin, MD;  Location: [facility] OR[facility];  Service: General;  Laterality: N/A;  . LOWER EXTREMITY ANGIOGRAPHY Left 12/17/2017   Procedure: LOWER EXTREMITY ANGIOGRAPHY;  Surgeon: Katha Cabal, MD;  Location: [facility];  Service: Cardiovascular;  Laterality: Left;  . LOWER EXTREMITY ANGIOGRAPHY Left 10/24/2018   Procedure: LOWER EXTREMITY ANGIOGRAPHY;  Surgeon: Algernon Huxley, MD;  Location: [facility] [facility];  Service: Cardiovascular;  Laterality: Left;  . PERIPHERAL VASCULAR CATHETERIZATION Left 05/04/2015   Procedure: Lower Extremity Angiography;  Surgeon: Katha Cabal, MD;  Location: [facility] [facility];  Service: Cardiovascular;  Laterality: Left;  . PERIPHERAL VASCULAR CATHETERIZATION Left 05/04/2015   Procedure: Lower Extremity Intervention;  Surgeon: Katha Cabal, MD;  Location: [place];  Service: Cardiovascular;  Laterality: Left;  . SHOULDER ARTHROSCOPY WITH OPEN ROTATOR CUFF REPAIR Right 01/16/2017   Procedure: SHOULDER ARTHROSCOPY WITH OPEN ROTATOR CUFF REPAIR;  Surgeon: Thornton Park,  MD;  Location: [facility];  Service: Orthopedics;  Laterality: Right;  . TONSILLECTOMY AND ADENOIDECTOMY  1971    Social History   Socioeconomic History  . Marital status: Divorced    Spouse name: Not on file  . Number of children: Not on file  . Years of education: Not on file  . Highest education level: Not on file  Occupational History  . Not on file  Social Needs  . Financial resource strain: Not hard at all  . Food insecurity    Worry: Never true     Inability: Never true  . Transportation needs    Medical: No    Non-medical: No  Tobacco Use  . Smoking status: Former Smoker    Packs/day: 0.50    Years: 30.00    Pack years: 15.00    Types: Cigarettes    Quit date: 07/25/2018    Years since quitting: 0.6  . Smokeless tobacco: Never Used  . Tobacco comment: Currently smoking 1/2 ppd  Substance and Sexual Activity  . Alcohol use: No    Alcohol/week: 0.0 standard drinks  . Drug use: Not Currently    Comment: Hx of.  . Sexual activity: Not Currently  Lifestyle  . Physical activity    Days per week: 0 days    Minutes per session: Not on file  . Stress: Not on file  Relationships  . Social Herbalist on phone: Not on file    Gets together: Not on file    Attends religious service: Not on file    Active member of club or organization: Not on file    Attends meetings of clubs or organizations: Not on file    Relationship status: Not on file  . Intimate partner violence    Fear of current or ex partner: No    Emotionally abused: No    Physically abused: No    Forced sexual activity: No  Other Topics Concern  . Not on file  Social History Narrative   1 daughter died in 08-12-16    Lives at home not alone     Family History  Problem Relation Age of Onset  . Arthritis Mother   . Mental illness Mother        depression  . Diabetes Mother   . Cancer Mother        pancreatic  . Cancer Father        colon  . Heart disease Father   . Diabetes Father   . Cancer Daughter        lung   No Known Allergies  ? Current Outpatient Medications  Medication Sig Dispense Refill  . acetaminophen (TYLENOL) 325 MG tablet Take 2 tablets (650 mg total) by mouth every 6 (six) hours as needed for mild pain (or Fever >/= 101).    Marland Kitchen ampicillin-sulbactam (UNASYN) IVPB Inject 3 g into the vein every 6 (six) hours for 25 days. Indication:  Sacral osteomyelitis with stage IV decub-VRE and anaerobes Last Day of Therapy:  25 Labs - Once  weekly:  CBC/D, CRP, ESR and CMP Please pull PIC at completion of antibiotics 100 Units 0  . ARIPiprazole (ABILIFY) 2 MG tablet Take 1 tablet (2 mg total) by mouth at bedtime.    Marland Kitchen atorvastatin (LIPITOR) 40 MG tablet Take 1 tablet (40 mg total) by mouth daily. 90 tablet 3  . cholecalciferol (VITAMIN D3) 25 MCG (1000 UT) tablet Take 1 tablet (1,000 Units total) by mouth  daily.    . clonazepam (KLONOPIN) 0.125 MG disintegrating tablet Take 0.125 mg by mouth every 8 (eight) hours as needed (anxiety).    . clopidogrel (PLAVIX) 75 MG tablet TAKE 1 TABLET BY MOUTH ONCE DAILY 30 tablet 11  . docusate sodium (COLACE) 100 MG capsule Take 1 capsule (100 mg total) by mouth 2 (two) times daily. 30 capsule 0  . escitalopram (LEXAPRO) 5 MG tablet Take 1 tablet (5 mg total) by mouth daily.    . famotidine (PEPCID) 20 MG tablet Take 1 tablet (20 mg total) by mouth daily. 30 tablet 0  . ferrous sulfate 325 (65 FE) MG tablet Take 325 mg by mouth 2 (two) times daily.     Marland Kitchen HYDROcodone-acetaminophen (NORCO/VICODIN) 5-325 MG tablet Take 1 tablet by mouth every 6 (six) hours as needed for moderate pain. 14 tablet 0  . lisinopril (PRINIVIL,ZESTRIL) 10 MG tablet Take 1 tablet (10 mg total) by mouth daily. 90 tablet 3  . Melatonin 5 MG TABS Take 5 mg by mouth at bedtime.    . methadone (DOLOPHINE) 10 MG/5ML solution Take 5 mg by mouth every 6 (six) hours as needed for pain. Give 65 ml oral    . Multiple Vitamins-Iron (MULTIVITAMINS WITH IRON) TABS tablet Take 1 tablet by mouth daily.  0  . vitamin C (VITAMIN C) 250 MG tablet Take 1 tablet (250 mg total) by mouth 2 (two) times daily.    . bisacodyl (DULCOLAX) 5 MG EC tablet Take 1 tablet (5 mg total) by mouth daily as needed for moderate constipation. 30 tablet 0  . feeding supplement, GLUCERNA SHAKE, (GLUCERNA SHAKE) LIQD Take 237 mLs by mouth 2 (two) times daily between meals.  0  . polyethylene glycol (MIRALAX / GLYCOLAX) 17 g packet Take 17 g by mouth daily as needed  for severe constipation. (Patient taking differently: Take 17 g by mouth daily. ) 14 each 0   No current facility-administered medications for this visit.      Abtx:  Anti-infectives (From admission, onward)   None      REVIEW OF SYSTEMS:  Const: negative fever, negative chills, negative weight loss Eyes: negative diplopia or visual changes, negative eye pain ENT: negative coryza, negative sore throat Resp: negative cough, hemoptysis, dyspnea Cards: negative for chest pain, palpitations, lower extremity edema GU: negative for frequency, dysuria and hematuria GI: Negative for abdominal pain, diarrhea, bleeding, constipation Skin: negative for rash and pruritus Heme: negative for easy bruising and gum/nose bleeding MS: Generalized weakness.  Inability to walk, Neurolo:negative for headaches, dizziness, vertigo, has memory problems  Psych: negative for feelings of anxiety, depression  Endocrine: Has diabetes Allergy/Immunology-as above ?  Objective:  VITALS:  BP (!) 186/76 (BP Location: Left Arm, Patient Position: Sitting, Cuff Size: Normal)   Pulse 88   Ht 5' 6" (1.676 m) Comment: unable to stand  Wt 138 lb (62.6 kg) Comment: unable to stand/patient reports being weighed at SNF  BMI 22.27 kg/m  PHYSICAL EXAM:  General: Alert, cooperative, no distress, appears stated age.  Pale, in a wheelchair Head: Normocephalic, without obvious abnormality, atraumatic. Eyes: Conjunctivae clear, anicteric sclerae. Pupils are equal ENT did not examine because of mask neck: Supple, symmetrical, no adenopathy, thyroid: non tender no carotid bruit and no JVD. Back: No CVA tenderness. Lungs: Bilateral air entry Heart: S1-S2 Abdomen: Abdomen tight has varicosities of the veins in the epigastric area  extremities: Left foot, fifth toe ray excision.  Surgical site has healed well . small superficial  pressure ulcer on the mid lateral aspect of the foot Skin: Extensive ecchymosis/bruising over the  arms and front of chest Lymph: Cervical, supraclavicular normal. Neurologic: Did not examine in detail  I was unable to see the sacral decubitus because patient is unable to get up and to lie on the bed  pertinent Labs Lab Results CBC 21 platelet 373 0.8   ? Impression/Recommendation ? 68 year old female with history of diabetes, treated hepatitis C, chronic pain syndrome, recent stage IV sacral decubitus debridement with osteomyelitis of the sacrum  Sacral decubitus stage IV with osteomyelitis of the sacrum.  I was unable to see the sacral wound when she was in in my office. After she went back to the nursing home her nurse did a tele-visit around 2.00 PM and I was able to see the wound.  The wound did not look healthy.  It looked contaminated with stool.  There was a lot of maceration around the skin with erythema.  Told the nurse to put nystatin cream around.  Asked her to get an appointment with Dr. Celine Ahr the surgeon who did the debridement as soon as possible.   Today 03/03/19   After surgery , before discharge from [facility]    ?Continue Unasyn until 03/06/2019 and then she will go on p.o. Augmentin for 2 weeks.  She has to continue probiotic. Follow-up with surgeon. ? ___________________________________________________ Discussed with patient, requesting provider Note:  This document was prepared using Dragon voice recognition software and may include unintentional dictation errors.

## 2019-03-04 DIAGNOSIS — L89150 Pressure ulcer of sacral region, unstageable: Secondary | ICD-10-CM | POA: Diagnosis not present

## 2019-03-04 DIAGNOSIS — R2681 Unsteadiness on feet: Secondary | ICD-10-CM | POA: Diagnosis not present

## 2019-03-04 DIAGNOSIS — R262 Difficulty in walking, not elsewhere classified: Secondary | ICD-10-CM | POA: Diagnosis not present

## 2019-03-04 DIAGNOSIS — R41841 Cognitive communication deficit: Secondary | ICD-10-CM | POA: Diagnosis not present

## 2019-03-04 DIAGNOSIS — M6281 Muscle weakness (generalized): Secondary | ICD-10-CM | POA: Diagnosis not present

## 2019-03-04 DIAGNOSIS — R488 Other symbolic dysfunctions: Secondary | ICD-10-CM | POA: Diagnosis not present

## 2019-03-04 DIAGNOSIS — R1312 Dysphagia, oropharyngeal phase: Secondary | ICD-10-CM | POA: Diagnosis not present

## 2019-03-04 DIAGNOSIS — L89154 Pressure ulcer of sacral region, stage 4: Secondary | ICD-10-CM | POA: Diagnosis not present

## 2019-03-04 DIAGNOSIS — L89120 Pressure ulcer of left upper back, unstageable: Secondary | ICD-10-CM | POA: Diagnosis not present

## 2019-03-05 DIAGNOSIS — L89154 Pressure ulcer of sacral region, stage 4: Secondary | ICD-10-CM | POA: Diagnosis not present

## 2019-03-05 DIAGNOSIS — L89120 Pressure ulcer of left upper back, unstageable: Secondary | ICD-10-CM | POA: Diagnosis not present

## 2019-03-05 DIAGNOSIS — R41841 Cognitive communication deficit: Secondary | ICD-10-CM | POA: Diagnosis not present

## 2019-03-05 DIAGNOSIS — R488 Other symbolic dysfunctions: Secondary | ICD-10-CM | POA: Diagnosis not present

## 2019-03-05 DIAGNOSIS — L89150 Pressure ulcer of sacral region, unstageable: Secondary | ICD-10-CM | POA: Diagnosis not present

## 2019-03-05 DIAGNOSIS — R1312 Dysphagia, oropharyngeal phase: Secondary | ICD-10-CM | POA: Diagnosis not present

## 2019-03-05 DIAGNOSIS — R590 Localized enlarged lymph nodes: Secondary | ICD-10-CM | POA: Diagnosis not present

## 2019-03-05 DIAGNOSIS — R262 Difficulty in walking, not elsewhere classified: Secondary | ICD-10-CM | POA: Diagnosis not present

## 2019-03-05 DIAGNOSIS — M6281 Muscle weakness (generalized): Secondary | ICD-10-CM | POA: Diagnosis not present

## 2019-03-05 DIAGNOSIS — R2242 Localized swelling, mass and lump, left lower limb: Secondary | ICD-10-CM | POA: Diagnosis not present

## 2019-03-05 DIAGNOSIS — M79662 Pain in left lower leg: Secondary | ICD-10-CM | POA: Diagnosis not present

## 2019-03-05 DIAGNOSIS — R2681 Unsteadiness on feet: Secondary | ICD-10-CM | POA: Diagnosis not present

## 2019-03-05 DIAGNOSIS — I82411 Acute embolism and thrombosis of right femoral vein: Secondary | ICD-10-CM | POA: Diagnosis not present

## 2019-03-06 DIAGNOSIS — E7800 Pure hypercholesterolemia, unspecified: Secondary | ICD-10-CM | POA: Diagnosis not present

## 2019-03-06 DIAGNOSIS — L97423 Non-pressure chronic ulcer of left heel and midfoot with necrosis of muscle: Secondary | ICD-10-CM | POA: Diagnosis not present

## 2019-03-06 DIAGNOSIS — L89150 Pressure ulcer of sacral region, unstageable: Secondary | ICD-10-CM | POA: Diagnosis not present

## 2019-03-06 DIAGNOSIS — D649 Anemia, unspecified: Secondary | ICD-10-CM | POA: Diagnosis not present

## 2019-03-06 DIAGNOSIS — A408 Other streptococcal sepsis: Secondary | ICD-10-CM | POA: Diagnosis not present

## 2019-03-06 DIAGNOSIS — R262 Difficulty in walking, not elsewhere classified: Secondary | ICD-10-CM | POA: Diagnosis not present

## 2019-03-06 DIAGNOSIS — L89120 Pressure ulcer of left upper back, unstageable: Secondary | ICD-10-CM | POA: Diagnosis not present

## 2019-03-06 DIAGNOSIS — L97529 Non-pressure chronic ulcer of other part of left foot with unspecified severity: Secondary | ICD-10-CM | POA: Diagnosis not present

## 2019-03-06 DIAGNOSIS — Z872 Personal history of diseases of the skin and subcutaneous tissue: Secondary | ICD-10-CM | POA: Diagnosis not present

## 2019-03-06 DIAGNOSIS — L89620 Pressure ulcer of left heel, unstageable: Secondary | ICD-10-CM | POA: Diagnosis not present

## 2019-03-06 DIAGNOSIS — M6281 Muscle weakness (generalized): Secondary | ICD-10-CM | POA: Diagnosis not present

## 2019-03-06 DIAGNOSIS — R2681 Unsteadiness on feet: Secondary | ICD-10-CM | POA: Diagnosis not present

## 2019-03-06 DIAGNOSIS — L89154 Pressure ulcer of sacral region, stage 4: Secondary | ICD-10-CM | POA: Diagnosis not present

## 2019-03-06 DIAGNOSIS — R488 Other symbolic dysfunctions: Secondary | ICD-10-CM | POA: Diagnosis not present

## 2019-03-06 DIAGNOSIS — R41841 Cognitive communication deficit: Secondary | ICD-10-CM | POA: Diagnosis not present

## 2019-03-06 DIAGNOSIS — R1312 Dysphagia, oropharyngeal phase: Secondary | ICD-10-CM | POA: Diagnosis not present

## 2019-03-09 DIAGNOSIS — Z20828 Contact with and (suspected) exposure to other viral communicable diseases: Secondary | ICD-10-CM | POA: Diagnosis not present

## 2019-03-09 DIAGNOSIS — R2681 Unsteadiness on feet: Secondary | ICD-10-CM | POA: Diagnosis not present

## 2019-03-09 DIAGNOSIS — L89150 Pressure ulcer of sacral region, unstageable: Secondary | ICD-10-CM | POA: Diagnosis not present

## 2019-03-09 DIAGNOSIS — R262 Difficulty in walking, not elsewhere classified: Secondary | ICD-10-CM | POA: Diagnosis not present

## 2019-03-09 DIAGNOSIS — M2550 Pain in unspecified joint: Secondary | ICD-10-CM | POA: Diagnosis not present

## 2019-03-09 DIAGNOSIS — R488 Other symbolic dysfunctions: Secondary | ICD-10-CM | POA: Diagnosis not present

## 2019-03-09 DIAGNOSIS — L89154 Pressure ulcer of sacral region, stage 4: Secondary | ICD-10-CM | POA: Diagnosis not present

## 2019-03-09 DIAGNOSIS — R1312 Dysphagia, oropharyngeal phase: Secondary | ICD-10-CM | POA: Diagnosis not present

## 2019-03-09 DIAGNOSIS — L89120 Pressure ulcer of left upper back, unstageable: Secondary | ICD-10-CM | POA: Diagnosis not present

## 2019-03-09 DIAGNOSIS — B342 Coronavirus infection, unspecified: Secondary | ICD-10-CM | POA: Diagnosis not present

## 2019-03-09 DIAGNOSIS — R41841 Cognitive communication deficit: Secondary | ICD-10-CM | POA: Diagnosis not present

## 2019-03-09 DIAGNOSIS — M6281 Muscle weakness (generalized): Secondary | ICD-10-CM | POA: Diagnosis not present

## 2019-03-10 DIAGNOSIS — R2681 Unsteadiness on feet: Secondary | ICD-10-CM | POA: Diagnosis not present

## 2019-03-10 DIAGNOSIS — A419 Sepsis, unspecified organism: Secondary | ICD-10-CM | POA: Diagnosis not present

## 2019-03-10 DIAGNOSIS — K5900 Constipation, unspecified: Secondary | ICD-10-CM | POA: Diagnosis not present

## 2019-03-10 DIAGNOSIS — R531 Weakness: Secondary | ICD-10-CM | POA: Diagnosis not present

## 2019-03-10 DIAGNOSIS — L89620 Pressure ulcer of left heel, unstageable: Secondary | ICD-10-CM | POA: Diagnosis not present

## 2019-03-10 DIAGNOSIS — L89120 Pressure ulcer of left upper back, unstageable: Secondary | ICD-10-CM | POA: Diagnosis not present

## 2019-03-10 DIAGNOSIS — F418 Other specified anxiety disorders: Secondary | ICD-10-CM | POA: Diagnosis not present

## 2019-03-10 DIAGNOSIS — G8929 Other chronic pain: Secondary | ICD-10-CM | POA: Diagnosis not present

## 2019-03-10 DIAGNOSIS — M24572 Contracture, left ankle: Secondary | ICD-10-CM | POA: Diagnosis not present

## 2019-03-10 DIAGNOSIS — L89154 Pressure ulcer of sacral region, stage 4: Secondary | ICD-10-CM | POA: Diagnosis not present

## 2019-03-10 DIAGNOSIS — D649 Anemia, unspecified: Secondary | ICD-10-CM | POA: Diagnosis not present

## 2019-03-10 DIAGNOSIS — L89610 Pressure ulcer of right heel, unstageable: Secondary | ICD-10-CM | POA: Diagnosis not present

## 2019-03-10 DIAGNOSIS — R498 Other voice and resonance disorders: Secondary | ICD-10-CM | POA: Diagnosis not present

## 2019-03-10 DIAGNOSIS — M6281 Muscle weakness (generalized): Secondary | ICD-10-CM | POA: Diagnosis not present

## 2019-03-10 DIAGNOSIS — I10 Essential (primary) hypertension: Secondary | ICD-10-CM | POA: Diagnosis not present

## 2019-03-10 DIAGNOSIS — E785 Hyperlipidemia, unspecified: Secondary | ICD-10-CM | POA: Diagnosis not present

## 2019-03-10 DIAGNOSIS — L89150 Pressure ulcer of sacral region, unstageable: Secondary | ICD-10-CM | POA: Diagnosis not present

## 2019-03-10 DIAGNOSIS — M24571 Contracture, right ankle: Secondary | ICD-10-CM | POA: Diagnosis not present

## 2019-03-11 DIAGNOSIS — D649 Anemia, unspecified: Secondary | ICD-10-CM | POA: Diagnosis not present

## 2019-03-11 DIAGNOSIS — L89620 Pressure ulcer of left heel, unstageable: Secondary | ICD-10-CM | POA: Diagnosis not present

## 2019-03-11 DIAGNOSIS — R2681 Unsteadiness on feet: Secondary | ICD-10-CM | POA: Diagnosis not present

## 2019-03-11 DIAGNOSIS — L89000 Pressure ulcer of unspecified elbow, unstageable: Secondary | ICD-10-CM | POA: Diagnosis not present

## 2019-03-11 DIAGNOSIS — R531 Weakness: Secondary | ICD-10-CM | POA: Diagnosis not present

## 2019-03-11 DIAGNOSIS — L89610 Pressure ulcer of right heel, unstageable: Secondary | ICD-10-CM | POA: Diagnosis not present

## 2019-03-11 DIAGNOSIS — E7800 Pure hypercholesterolemia, unspecified: Secondary | ICD-10-CM | POA: Diagnosis not present

## 2019-03-11 DIAGNOSIS — L89120 Pressure ulcer of left upper back, unstageable: Secondary | ICD-10-CM | POA: Diagnosis not present

## 2019-03-11 DIAGNOSIS — R498 Other voice and resonance disorders: Secondary | ICD-10-CM | POA: Diagnosis not present

## 2019-03-11 DIAGNOSIS — M2550 Pain in unspecified joint: Secondary | ICD-10-CM | POA: Diagnosis not present

## 2019-03-11 DIAGNOSIS — L89154 Pressure ulcer of sacral region, stage 4: Secondary | ICD-10-CM | POA: Diagnosis not present

## 2019-03-11 DIAGNOSIS — M6281 Muscle weakness (generalized): Secondary | ICD-10-CM | POA: Diagnosis not present

## 2019-03-11 DIAGNOSIS — L89150 Pressure ulcer of sacral region, unstageable: Secondary | ICD-10-CM | POA: Diagnosis not present

## 2019-03-12 ENCOUNTER — Other Ambulatory Visit: Payer: Self-pay

## 2019-03-12 ENCOUNTER — Encounter: Payer: Self-pay | Admitting: General Surgery

## 2019-03-12 ENCOUNTER — Ambulatory Visit (INDEPENDENT_AMBULATORY_CARE_PROVIDER_SITE_OTHER): Payer: Medicare HMO | Admitting: General Surgery

## 2019-03-12 VITALS — BP 177/91 | HR 81 | Temp 97.9°F | Resp 11 | Ht 66.0 in | Wt 138.0 lb

## 2019-03-12 DIAGNOSIS — L89154 Pressure ulcer of sacral region, stage 4: Secondary | ICD-10-CM | POA: Diagnosis not present

## 2019-03-12 DIAGNOSIS — L89120 Pressure ulcer of left upper back, unstageable: Secondary | ICD-10-CM | POA: Diagnosis not present

## 2019-03-12 DIAGNOSIS — R531 Weakness: Secondary | ICD-10-CM | POA: Diagnosis not present

## 2019-03-12 DIAGNOSIS — L89620 Pressure ulcer of left heel, unstageable: Secondary | ICD-10-CM | POA: Diagnosis not present

## 2019-03-12 DIAGNOSIS — L89150 Pressure ulcer of sacral region, unstageable: Secondary | ICD-10-CM | POA: Diagnosis not present

## 2019-03-12 DIAGNOSIS — M6281 Muscle weakness (generalized): Secondary | ICD-10-CM | POA: Diagnosis not present

## 2019-03-12 DIAGNOSIS — R498 Other voice and resonance disorders: Secondary | ICD-10-CM | POA: Diagnosis not present

## 2019-03-12 DIAGNOSIS — L89610 Pressure ulcer of right heel, unstageable: Secondary | ICD-10-CM | POA: Diagnosis not present

## 2019-03-12 DIAGNOSIS — R2681 Unsteadiness on feet: Secondary | ICD-10-CM | POA: Diagnosis not present

## 2019-03-12 NOTE — Patient Instructions (Signed)
   Follow-up with our office as needed.  Please call and ask to speak with a nurse if you develop questions or concerns.  

## 2019-03-12 NOTE — Progress Notes (Signed)
Yesenia Shepard is here today for evaluation of her sacral wound.  She is a 68 year old woman upon whom I operated for an infected sacral decubitus ulcer at the end of July.  She did have biopsy-proven osteomyelitis and has been on IV antibiotics.  She was recently switched to oral antibiotics after seeing Dr. Delaine Lame in infectious disease.    She was discharged to a skilled nursing facility.  She states that the nursing care has been suboptimal, as far as keeping her dry (she is wearing a brief due to urinary incontinence).  She saw Dr. Delaine Lame on August 25.  At that time, Dr. Delaine Lame was concerned about the appearance of the wound and referred her to see me again for reevaluation.  In addition to the concern about moisture in the perineal and sacral area, [name] states that she continues to have some pain.  She is not very active, spending most of her time in bed.  She denies any fevers or chills.  No nausea or vomiting.  Today's Vitals   03/12/19 1441  BP: (!) 177/91  Pulse: 81  Resp: 11  Temp: 97.9 F (36.6 C)  SpO2: 98%  Weight: 138 lb (62.6 kg)  Height: 5\' 6"  (1.676 m)   Body mass index is 22.27 kg/m. Focused examination: The patient's incontinence brief is saturated and smells very strongly of urine.  The sacral ulcer was exposed.  The skin surrounding the ulcer is  macerated from excessive moisture.  The actual tissue appears to be granulating in nicely.  There is a thin layer of fibrinous exudate and biofilm present.  No concern for additional necrosis or infection.    Impression and plan: [name] is a 68 year old woman with multiple medical comorbidities.  She underwent debridement of a stage IV sacral decubitus ulcer with osteomyelitis.  She is currently completing her course of antibiotics.  Overall, the wound is healing fairly well.  She would benefit from being kept dry which may entail every 2 hour brief exchanges.  Today, I placed Silvadene in the wound however I  would recommend that the wound be scrubbed clean with a somewhat rough washcloth or a chlorhexidine surgical scrub brush on a daily basis to remove the biofilm.  I would then apply a thin layer of collagenase to break down any residual fibrinous exudate.  Cover this with a saline moistened gauze and apply a dry gauze dressing on top.  The main concern is the surrounding moisture as well as the patient's continued immobility.  No follow-up has been made but she is welcome to return at any time to address concerns.

## 2019-03-13 DIAGNOSIS — L89610 Pressure ulcer of right heel, unstageable: Secondary | ICD-10-CM | POA: Diagnosis not present

## 2019-03-13 DIAGNOSIS — L89620 Pressure ulcer of left heel, unstageable: Secondary | ICD-10-CM | POA: Diagnosis not present

## 2019-03-13 DIAGNOSIS — Z872 Personal history of diseases of the skin and subcutaneous tissue: Secondary | ICD-10-CM | POA: Diagnosis not present

## 2019-03-13 DIAGNOSIS — R498 Other voice and resonance disorders: Secondary | ICD-10-CM | POA: Diagnosis not present

## 2019-03-13 DIAGNOSIS — L89623 Pressure ulcer of left heel, stage 3: Secondary | ICD-10-CM | POA: Diagnosis not present

## 2019-03-13 DIAGNOSIS — R2681 Unsteadiness on feet: Secondary | ICD-10-CM | POA: Diagnosis not present

## 2019-03-13 DIAGNOSIS — L89150 Pressure ulcer of sacral region, unstageable: Secondary | ICD-10-CM | POA: Diagnosis not present

## 2019-03-13 DIAGNOSIS — M6281 Muscle weakness (generalized): Secondary | ICD-10-CM | POA: Diagnosis not present

## 2019-03-13 DIAGNOSIS — L89154 Pressure ulcer of sacral region, stage 4: Secondary | ICD-10-CM | POA: Diagnosis not present

## 2019-03-13 DIAGNOSIS — R531 Weakness: Secondary | ICD-10-CM | POA: Diagnosis not present

## 2019-03-13 DIAGNOSIS — L89120 Pressure ulcer of left upper back, unstageable: Secondary | ICD-10-CM | POA: Diagnosis not present

## 2019-03-16 DIAGNOSIS — L89620 Pressure ulcer of left heel, unstageable: Secondary | ICD-10-CM | POA: Diagnosis not present

## 2019-03-16 DIAGNOSIS — F331 Major depressive disorder, recurrent, moderate: Secondary | ICD-10-CM | POA: Diagnosis not present

## 2019-03-16 DIAGNOSIS — R2681 Unsteadiness on feet: Secondary | ICD-10-CM | POA: Diagnosis not present

## 2019-03-16 DIAGNOSIS — L89120 Pressure ulcer of left upper back, unstageable: Secondary | ICD-10-CM | POA: Diagnosis not present

## 2019-03-16 DIAGNOSIS — R498 Other voice and resonance disorders: Secondary | ICD-10-CM | POA: Diagnosis not present

## 2019-03-16 DIAGNOSIS — F0150 Vascular dementia without behavioral disturbance: Secondary | ICD-10-CM | POA: Diagnosis not present

## 2019-03-16 DIAGNOSIS — M6281 Muscle weakness (generalized): Secondary | ICD-10-CM | POA: Diagnosis not present

## 2019-03-16 DIAGNOSIS — R531 Weakness: Secondary | ICD-10-CM | POA: Diagnosis not present

## 2019-03-16 DIAGNOSIS — G4700 Insomnia, unspecified: Secondary | ICD-10-CM | POA: Diagnosis not present

## 2019-03-16 DIAGNOSIS — F419 Anxiety disorder, unspecified: Secondary | ICD-10-CM | POA: Diagnosis not present

## 2019-03-16 DIAGNOSIS — N184 Chronic kidney disease, stage 4 (severe): Secondary | ICD-10-CM | POA: Diagnosis not present

## 2019-03-16 DIAGNOSIS — K7460 Unspecified cirrhosis of liver: Secondary | ICD-10-CM | POA: Diagnosis not present

## 2019-03-16 DIAGNOSIS — Z20828 Contact with and (suspected) exposure to other viral communicable diseases: Secondary | ICD-10-CM | POA: Diagnosis not present

## 2019-03-16 DIAGNOSIS — L89154 Pressure ulcer of sacral region, stage 4: Secondary | ICD-10-CM | POA: Diagnosis not present

## 2019-03-16 DIAGNOSIS — L89610 Pressure ulcer of right heel, unstageable: Secondary | ICD-10-CM | POA: Diagnosis not present

## 2019-03-16 DIAGNOSIS — L89150 Pressure ulcer of sacral region, unstageable: Secondary | ICD-10-CM | POA: Diagnosis not present

## 2019-03-16 DIAGNOSIS — E1122 Type 2 diabetes mellitus with diabetic chronic kidney disease: Secondary | ICD-10-CM | POA: Diagnosis not present

## 2019-03-16 DIAGNOSIS — F1920 Other psychoactive substance dependence, uncomplicated: Secondary | ICD-10-CM | POA: Diagnosis not present

## 2019-03-17 DIAGNOSIS — R531 Weakness: Secondary | ICD-10-CM | POA: Diagnosis not present

## 2019-03-17 DIAGNOSIS — L89154 Pressure ulcer of sacral region, stage 4: Secondary | ICD-10-CM | POA: Diagnosis not present

## 2019-03-17 DIAGNOSIS — R498 Other voice and resonance disorders: Secondary | ICD-10-CM | POA: Diagnosis not present

## 2019-03-17 DIAGNOSIS — L89620 Pressure ulcer of left heel, unstageable: Secondary | ICD-10-CM | POA: Diagnosis not present

## 2019-03-17 DIAGNOSIS — L89120 Pressure ulcer of left upper back, unstageable: Secondary | ICD-10-CM | POA: Diagnosis not present

## 2019-03-17 DIAGNOSIS — R2681 Unsteadiness on feet: Secondary | ICD-10-CM | POA: Diagnosis not present

## 2019-03-17 DIAGNOSIS — L89610 Pressure ulcer of right heel, unstageable: Secondary | ICD-10-CM | POA: Diagnosis not present

## 2019-03-17 DIAGNOSIS — L89150 Pressure ulcer of sacral region, unstageable: Secondary | ICD-10-CM | POA: Diagnosis not present

## 2019-03-17 DIAGNOSIS — M6281 Muscle weakness (generalized): Secondary | ICD-10-CM | POA: Diagnosis not present

## 2019-03-18 ENCOUNTER — Other Ambulatory Visit: Payer: Self-pay | Admitting: Family Medicine

## 2019-03-18 DIAGNOSIS — L89610 Pressure ulcer of right heel, unstageable: Secondary | ICD-10-CM | POA: Diagnosis not present

## 2019-03-18 DIAGNOSIS — L89120 Pressure ulcer of left upper back, unstageable: Secondary | ICD-10-CM | POA: Diagnosis not present

## 2019-03-18 DIAGNOSIS — L89150 Pressure ulcer of sacral region, unstageable: Secondary | ICD-10-CM | POA: Diagnosis not present

## 2019-03-18 DIAGNOSIS — M6281 Muscle weakness (generalized): Secondary | ICD-10-CM | POA: Diagnosis not present

## 2019-03-18 DIAGNOSIS — R498 Other voice and resonance disorders: Secondary | ICD-10-CM | POA: Diagnosis not present

## 2019-03-18 DIAGNOSIS — R2681 Unsteadiness on feet: Secondary | ICD-10-CM | POA: Diagnosis not present

## 2019-03-18 DIAGNOSIS — R531 Weakness: Secondary | ICD-10-CM | POA: Diagnosis not present

## 2019-03-18 DIAGNOSIS — L89620 Pressure ulcer of left heel, unstageable: Secondary | ICD-10-CM | POA: Diagnosis not present

## 2019-03-18 DIAGNOSIS — L89154 Pressure ulcer of sacral region, stage 4: Secondary | ICD-10-CM | POA: Diagnosis not present

## 2019-03-19 ENCOUNTER — Encounter (INDEPENDENT_AMBULATORY_CARE_PROVIDER_SITE_OTHER): Payer: Self-pay | Admitting: Vascular Surgery

## 2019-03-19 ENCOUNTER — Ambulatory Visit (INDEPENDENT_AMBULATORY_CARE_PROVIDER_SITE_OTHER): Payer: Medicare HMO

## 2019-03-19 ENCOUNTER — Ambulatory Visit (INDEPENDENT_AMBULATORY_CARE_PROVIDER_SITE_OTHER): Payer: Medicare HMO | Admitting: Vascular Surgery

## 2019-03-19 ENCOUNTER — Other Ambulatory Visit: Payer: Self-pay

## 2019-03-19 VITALS — BP 121/72 | HR 69 | Resp 12 | Ht 66.0 in | Wt 138.0 lb

## 2019-03-19 DIAGNOSIS — I739 Peripheral vascular disease, unspecified: Secondary | ICD-10-CM | POA: Diagnosis not present

## 2019-03-19 DIAGNOSIS — L89154 Pressure ulcer of sacral region, stage 4: Secondary | ICD-10-CM | POA: Diagnosis not present

## 2019-03-19 DIAGNOSIS — E785 Hyperlipidemia, unspecified: Secondary | ICD-10-CM

## 2019-03-19 DIAGNOSIS — Z89422 Acquired absence of other left toe(s): Secondary | ICD-10-CM | POA: Diagnosis not present

## 2019-03-19 DIAGNOSIS — I70299 Other atherosclerosis of native arteries of extremities, unspecified extremity: Secondary | ICD-10-CM | POA: Diagnosis not present

## 2019-03-19 DIAGNOSIS — I10 Essential (primary) hypertension: Secondary | ICD-10-CM | POA: Diagnosis not present

## 2019-03-19 DIAGNOSIS — R2681 Unsteadiness on feet: Secondary | ICD-10-CM | POA: Diagnosis not present

## 2019-03-19 DIAGNOSIS — M2550 Pain in unspecified joint: Secondary | ICD-10-CM | POA: Diagnosis not present

## 2019-03-19 DIAGNOSIS — L89120 Pressure ulcer of left upper back, unstageable: Secondary | ICD-10-CM | POA: Diagnosis not present

## 2019-03-19 DIAGNOSIS — L89620 Pressure ulcer of left heel, unstageable: Secondary | ICD-10-CM | POA: Diagnosis not present

## 2019-03-19 DIAGNOSIS — R531 Weakness: Secondary | ICD-10-CM | POA: Diagnosis not present

## 2019-03-19 DIAGNOSIS — E119 Type 2 diabetes mellitus without complications: Secondary | ICD-10-CM | POA: Diagnosis not present

## 2019-03-19 DIAGNOSIS — D649 Anemia, unspecified: Secondary | ICD-10-CM | POA: Diagnosis not present

## 2019-03-19 DIAGNOSIS — L97909 Non-pressure chronic ulcer of unspecified part of unspecified lower leg with unspecified severity: Secondary | ICD-10-CM | POA: Diagnosis not present

## 2019-03-19 DIAGNOSIS — L89610 Pressure ulcer of right heel, unstageable: Secondary | ICD-10-CM | POA: Diagnosis not present

## 2019-03-19 DIAGNOSIS — R498 Other voice and resonance disorders: Secondary | ICD-10-CM | POA: Diagnosis not present

## 2019-03-19 DIAGNOSIS — L89150 Pressure ulcer of sacral region, unstageable: Secondary | ICD-10-CM | POA: Diagnosis not present

## 2019-03-19 DIAGNOSIS — M6281 Muscle weakness (generalized): Secondary | ICD-10-CM | POA: Diagnosis not present

## 2019-03-20 DIAGNOSIS — L89624 Pressure ulcer of left heel, stage 4: Secondary | ICD-10-CM | POA: Diagnosis not present

## 2019-03-20 DIAGNOSIS — Z872 Personal history of diseases of the skin and subcutaneous tissue: Secondary | ICD-10-CM | POA: Diagnosis not present

## 2019-03-20 DIAGNOSIS — L89154 Pressure ulcer of sacral region, stage 4: Secondary | ICD-10-CM | POA: Diagnosis not present

## 2019-03-23 DIAGNOSIS — Z1159 Encounter for screening for other viral diseases: Secondary | ICD-10-CM | POA: Diagnosis not present

## 2019-03-26 ENCOUNTER — Encounter (INDEPENDENT_AMBULATORY_CARE_PROVIDER_SITE_OTHER): Payer: Self-pay | Admitting: Vascular Surgery

## 2019-03-26 DIAGNOSIS — E785 Hyperlipidemia, unspecified: Secondary | ICD-10-CM | POA: Diagnosis not present

## 2019-03-26 DIAGNOSIS — G8929 Other chronic pain: Secondary | ICD-10-CM | POA: Diagnosis not present

## 2019-03-26 DIAGNOSIS — I10 Essential (primary) hypertension: Secondary | ICD-10-CM | POA: Diagnosis not present

## 2019-03-26 DIAGNOSIS — Z7901 Long term (current) use of anticoagulants: Secondary | ICD-10-CM | POA: Diagnosis not present

## 2019-03-26 DIAGNOSIS — F418 Other specified anxiety disorders: Secondary | ICD-10-CM | POA: Diagnosis not present

## 2019-03-26 DIAGNOSIS — K5900 Constipation, unspecified: Secondary | ICD-10-CM | POA: Diagnosis not present

## 2019-03-26 DIAGNOSIS — I82409 Acute embolism and thrombosis of unspecified deep veins of unspecified lower extremity: Secondary | ICD-10-CM | POA: Diagnosis not present

## 2019-03-26 DIAGNOSIS — F329 Major depressive disorder, single episode, unspecified: Secondary | ICD-10-CM | POA: Diagnosis not present

## 2019-03-26 DIAGNOSIS — L89154 Pressure ulcer of sacral region, stage 4: Secondary | ICD-10-CM | POA: Diagnosis not present

## 2019-03-26 NOTE — Progress Notes (Signed)
MRN : [id number]  Yesenia Shepard is a 68 y.o. ([birth year]) female who presents with chief complaint of  Chief Complaint  Patient presents with  . Follow-up  .  History of Present Illness:   The patient returns to the office for followup and review of the noninvasive studies. There have been no interval changes in lower extremity symptoms. No interval shortening of the patient's claudication distance or development of rest pain symptoms. No new ulcers or wounds have occurred since the last visit.  There have been no significant changes to the patient's overall health care.  The patient denies amaurosis fugax or recent TIA symptoms. There are no recent neurological changes noted. The patient denies history of DVT, PE or superficial thrombophlebitis. The patient denies recent episodes of angina or shortness of breath.   ABI Rt=1.08 and Lt=1.07  (previous ABI's Rt=1.38 and Lt=1.36)   Current Meds  Medication Sig  . acetaminophen (TYLENOL) 325 MG tablet Take 2 tablets (650 mg total) by mouth every 6 (six) hours as needed for mild pain (or Fever >/= 101).  Marland Kitchen atorvastatin (LIPITOR) 40 MG tablet Take 1 tablet (40 mg total) by mouth daily.  . bisacodyl (DULCOLAX) 5 MG EC tablet Take 1 tablet (5 mg total) by mouth daily as needed for moderate constipation.  . cholecalciferol (VITAMIN D3) 25 MCG (1000 UT) tablet Take 1 tablet (1,000 Units total) by mouth daily.  . clonazepam (KLONOPIN) 0.125 MG disintegrating tablet Take 0.125 mg by mouth 2 (two) times daily.  . clopidogrel (PLAVIX) 75 MG tablet TAKE 1 TABLET BY MOUTH ONCE DAILY  . docusate sodium (COLACE) 100 MG capsule Take 1 capsule (100 mg total) by mouth 2 (two) times daily.  Marland Kitchen enoxaparin (LOVENOX) 60 MG/0.6ML injection   . escitalopram (LEXAPRO) 5 MG tablet Take 1 tablet (5 mg total) by mouth daily.  . famotidine (PEPCID) 20 MG tablet Take 1 tablet (20 mg total) by mouth daily.  . ferrous sulfate 325 (65 FE) MG tablet Take 325 mg by  mouth 2 (two) times daily.   Marland Kitchen HYDROcodone-acetaminophen (NORCO/VICODIN) 5-325 MG tablet Take 1 tablet by mouth every 6 (six) hours as needed for moderate pain.  Marland Kitchen lisinopril (PRINIVIL,ZESTRIL) 10 MG tablet Take 1 tablet (10 mg total) by mouth daily.  . Melatonin 5 MG TABS Take 5 mg by mouth at bedtime.  . methadone (DOLOPHINE) 10 MG/5ML solution Take 5 mg by mouth every 6 (six) hours as needed for pain. Give 65 ml oral  . Multiple Vitamins-Iron (MULTIVITAMINS WITH IRON) TABS tablet Take 1 tablet by mouth daily.  . Polyethylene Glycol 3350 (MIRALAX PO) Take by mouth.  . potassium chloride (K-DUR) 10 MEQ tablet Take 10 mEq by mouth daily.  . traZODone (DESYREL) 50 MG tablet Take 50 mg by mouth at bedtime.  . vitamin C (VITAMIN C) 250 MG tablet Take 1 tablet (250 mg total) by mouth 2 (two) times daily.    Past Medical History:  Diagnosis Date  . Anxiety   . Arthritis   . Chronic pain   . Depression   . Diabetes mellitus without complication ([place])   . Drug abuse ([place])    History of polysubstance abuse; currently on methadone.  . Hepatitis    History of Hep "C". treated and cured with Harvoni  . History of chicken pox   . Hypertension   . Panic attacks   . Peripheral vascular disease ([place])    stent in place.   Marland Kitchen UTI (urinary  tract infection)    History of    Past Surgical History:  Procedure Laterality Date  . ABDOMINAL HYSTERECTOMY  1989  . AMPUTATION TOE Left 10/26/2018   Procedure: 5TH RAY RESCTION;  Surgeon: Albertine Patricia, DPM;  Location: [facility];  Service: Podiatry;  Laterality: Left;  . APPLICATION OF WOUND VAC  02/03/2019   Procedure: APPLICATION OF WOUND VAC;  Surgeon: Fredirick Maudlin, MD;  Location: [facility];  Service: General;;  . APPLICATION OF WOUND VAC N/A 02/05/2019   Procedure: APPLICATION OF WOUND VAC POSS DEBRIDEMENT;  Surgeon: Fredirick Maudlin, MD;  Location: [facility];  Service: General;  Laterality: N/A;  . CHOLECYSTECTOMY  1987  . INTRACAPSULAR CATARACT  EXTRACTION Left   . IRRIGATION AND DEBRIDEMENT ABSCESS N/A 02/03/2019   Procedure: IRRIGATION AND DEBRIDEMENT SACRAL DECUBITUS;  Surgeon: Fredirick Maudlin, MD;  Location: [facility];  Service: General;  Laterality: N/A;  . LOWER EXTREMITY ANGIOGRAPHY Left 12/17/2017   Procedure: LOWER EXTREMITY ANGIOGRAPHY;  Surgeon: Katha Cabal, MD;  Location: [facility];  Service: Cardiovascular;  Laterality: Left;  . LOWER EXTREMITY ANGIOGRAPHY Left 10/24/2018   Procedure: LOWER EXTREMITY ANGIOGRAPHY;  Surgeon: Algernon Huxley, MD;  Location: [facility];  Service: Cardiovascular;  Laterality: Left;  . PERIPHERAL VASCULAR CATHETERIZATION Left 05/04/2015   Procedure: Lower Extremity Angiography;  Surgeon: Katha Cabal, MD;  Location: [facility];  Service: Cardiovascular;  Laterality: Left;  . PERIPHERAL VASCULAR CATHETERIZATION Left 05/04/2015   Procedure: Lower Extremity Intervention;  Surgeon: Katha Cabal, MD;  Location: [facility];  Service: Cardiovascular;  Laterality: Left;  . SHOULDER ARTHROSCOPY WITH OPEN ROTATOR CUFF REPAIR Right 01/16/2017   Procedure: SHOULDER ARTHROSCOPY WITH OPEN ROTATOR CUFF REPAIR;  Surgeon: Thornton Park, MD;  Location: [facility];  Service: Orthopedics;  Laterality: Right;  . TONSILLECTOMY AND ADENOIDECTOMY  1971    Social History Social History   Tobacco Use  . Smoking status: Former Smoker    Packs/day: 0.50    Years: 30.00    Pack years: 15.00    Types: Cigarettes    Quit date: 07/25/2018    Years since quitting: 0.6  . Smokeless tobacco: Never Used  Substance Use Topics  . Alcohol use: No    Alcohol/week: 0.0 standard drinks  . Drug use: Not Currently    Comment: Hx of.    Family History Family History  Problem Relation Age of Onset  . Arthritis Mother   . Mental illness Mother        depression  . Diabetes Mother   . Cancer Mother        pancreatic  . Cancer Father        colon  . Heart disease Father    . Diabetes Father   . Cancer Daughter        lung    No Known Allergies   REVIEW OF SYSTEMS (Negative unless checked)  Constitutional: [] Weight loss  [] Fever  [] Chills Cardiac: [] Chest pain   [] Chest pressure   [] Palpitations   [] Shortness of breath when laying flat   [] Shortness of breath with exertion. Vascular:  [x] Pain in legs with walking   [] Pain in legs at rest  [] History of DVT   [] Phlebitis   [] Swelling in legs   [] Varicose veins   [] Non-healing ulcers Pulmonary:   [] Uses home oxygen   [] Productive cough   [] Hemoptysis   [] Wheeze  [] COPD   [] Asthma Neurologic:  [] Dizziness   [] Seizures   [] History  of stroke   [] History of TIA  [] Aphasia   [] Vissual changes   [] Weakness or numbness in arm   [] Weakness or numbness in leg Musculoskeletal:   [] Joint swelling   [x] Joint pain   [x] Low back pain Hematologic:  [] Easy bruising  [] Easy bleeding   [] Hypercoagulable state   [] Anemic Gastrointestinal:  [] Diarrhea   [] Vomiting  [] Gastroesophageal reflux/heartburn   [] Difficulty swallowing. Genitourinary:  [] Chronic kidney disease   [] Difficult urination  [] Frequent urination   [] Blood in urine Skin:  [] Rashes   [] Ulcers  Psychological:  [] History of anxiety   []  History of major depression.  Physical Examination  Vitals:   03/19/19 1043  BP: 121/72  Pulse: 69  Resp: 12  Weight: 138 lb (62.6 kg)  Height: 5\' 6"  (1.676 m)   Body mass index is 22.27 kg/m. Gen: WD/WN, NAD Head: [place]/AT, No temporalis wasting.  Ear/Nose/Throat: Hearing grossly intact, nares w/o erythema or drainage Eyes: PER, EOMI, sclera nonicteric.  Neck: Supple, no large masses.   Pulmonary:  Good air movement, no audible wheezing bilaterally, no use of accessory muscles.  Cardiac: RRR, no JVD Vascular:  Vessel Right Left  Radial Palpable Palpable  PT Palpable Palpable  DP Trace Palpable Not Palpable  Gastrointestinal: Non-distended. No guarding/no peritoneal signs.  Musculoskeletal: M/S 5/5 throughout.  No  deformity or atrophy.  Neurologic: CN 2-12 intact. Symmetrical.  Speech is fluent. Motor exam as listed above. Psychiatric: Judgment intact, Mood & affect appropriate for pt's clinical situation. Dermatologic: No rashes or ulcers noted.  No changes consistent with cellulitis. Lymph : No lichenification or skin changes of chronic lymphedema.  CBC Lab Results  Component Value Date   WBC 11.2 (H) 02/08/2019   HGB 7.8 (L) 02/08/2019   HCT 25.3 (L) 02/08/2019   MCV 95.1 02/08/2019   PLT 494 (H) 02/08/2019    BMET    Component Value Date/Time   NA 145 02/11/2019 0656   NA 139 02/04/2015 1305   K 4.1 02/11/2019 0656   CL 110 02/11/2019 0656   CO2 26 02/11/2019 0656   GLUCOSE 122 (H) 02/11/2019 0656   BUN 11 02/11/2019 0656   BUN 10 02/04/2015 1305   CREATININE 0.86 02/11/2019 0656   CREATININE 0.76 01/05/2016 1524   CALCIUM 8.1 (L) 02/11/2019 0656   GFRNONAA >60 02/11/2019 0656   GFRNONAA 83 01/05/2016 1524   GFRAA >60 02/11/2019 0656   GFRAA >89 01/05/2016 1524   CrCl cannot be calculated (Patient's most recent lab result is older than the maximum 21 days allowed.).  COAG Lab Results  Component Value Date   INR 1.3 (H) 01/28/2019   INR 1.06 01/10/2017   INR 1.17 10/18/2015    Radiology Vas [place] Burnard Bunting With/wo Tbi  Result Date: 03/23/2019 LOWER EXTREMITY DOPPLER STUDY Indications: Rest pain, and peripheral artery disease.  Vascular Interventions: 05-04-2015 Left external iliac stent, and left SSF and                         popliteal artery PTA.                         12-17-2017 Left external iliac and superficial artery                         PTA's. Comparison Study: 12/12/2018 Performing Technologist: Concha Norway RVT  Examination Guidelines: A complete evaluation includes at minimum, Doppler waveform signals and systolic  blood pressure reading at the level of bilateral brachial, anterior tibial, and posterior tibial arteries, when vessel segments are accessible. Bilateral  testing is considered an integral part of a complete examination. Photoelectric Plethysmograph (PPG) waveforms and toe systolic pressure readings are included as required and additional duplex testing as needed. Limited examinations for reoccurring indications may be performed as noted.  ABI Findings: +---------+------------------+-----+---------+--------+ Right    Rt Pressure (mmHg)IndexWaveform Comment  +---------+------------------+-----+---------+--------+ Brachial 143                                      +---------+------------------+-----+---------+--------+ ATA      155               1.08 triphasic         +---------+------------------+-----+---------+--------+ PTA      150               1.05 biphasic          +---------+------------------+-----+---------+--------+ Adair Patter               0.93 Normal            +---------+------------------+-----+---------+--------+ +---------+------------------+-----+---------+-------+ Left     Lt Pressure (mmHg)IndexWaveform Comment +---------+------------------+-----+---------+-------+ Brachial 141                                     +---------+------------------+-----+---------+-------+ ATA      153               1.07 biphasic         +---------+------------------+-----+---------+-------+ PTA      152               1.06 triphasic        +---------+------------------+-----+---------+-------+ [place]               0.96 Normal           +---------+------------------+-----+---------+-------+ +-------+-----------+-----------+------------+------------+ ABI/TBIToday's ABIToday's TBIPrevious ABIPrevious TBI +-------+-----------+-----------+------------+------------+ Right  1.08       .93        1.38        .93          +-------+-----------+-----------+------------+------------+ Left   1.07       .96        1.36        .90          +-------+-----------+-----------+------------+------------+  Bilateral ABIs and TBIs appear essentially unchanged compared to prior study on 12/12/2018.  Summary: Right: Resting right ankle-brachial index is within normal range. No evidence of significant right lower extremity arterial disease. The right toe-brachial index is normal. Left: Resting left ankle-brachial index is within normal range. No evidence of significant left lower extremity arterial disease. The left toe-brachial index is normal.  *See table(s) above for measurements and observations.  Electronically signed by Hortencia Pilar MD on 03/23/2019 at 5:26:30 PM.    Final     Assessment/Plan 1. Peripheral vascular disease ([place])  Recommend:  The patient has evidence of atherosclerosis of the lower extremities with claudication.  The patient does not voice lifestyle limiting changes at this point in time.  Noninvasive studies do not suggest clinically significant change.  No invasive studies, angiography or surgery at this time The patient should continue walking and begin a more formal exercise program.  The patient should continue antiplatelet therapy and aggressive treatment  of the lipid abnormalities  No changes in the patient's medications at this time  The patient should continue wearing graduated compression socks 10-15 mmHg strength to control the mild edema.   - VAS [place] ABI WITH/WO TBI; Future  2. Essential hypertension Continue antihypertensive medications as already ordered, these medications have been reviewed and there are no changes at this time.   3. Type 2 diabetes mellitus without complication, without long-term current use of insulin (HCC) Continue hypoglycemic medications as already ordered, these medications have been reviewed and there are no changes at this time.  Hgb A1C to be monitored as already arranged by primary service   4. Hyperlipidemia, unspecified hyperlipidemia type Continue statin as ordered and reviewed, no changes at this time     Hortencia Pilar, MD  03/26/2019 12:51 PM

## 2019-03-27 DIAGNOSIS — I10 Essential (primary) hypertension: Secondary | ICD-10-CM | POA: Diagnosis not present

## 2019-03-27 DIAGNOSIS — E7800 Pure hypercholesterolemia, unspecified: Secondary | ICD-10-CM | POA: Diagnosis not present

## 2019-03-27 DIAGNOSIS — M2550 Pain in unspecified joint: Secondary | ICD-10-CM | POA: Diagnosis not present

## 2019-03-27 DIAGNOSIS — R748 Abnormal levels of other serum enzymes: Secondary | ICD-10-CM | POA: Diagnosis not present

## 2019-03-27 DIAGNOSIS — Z872 Personal history of diseases of the skin and subcutaneous tissue: Secondary | ICD-10-CM | POA: Diagnosis not present

## 2019-03-27 DIAGNOSIS — L89624 Pressure ulcer of left heel, stage 4: Secondary | ICD-10-CM | POA: Diagnosis not present

## 2019-03-27 DIAGNOSIS — L89154 Pressure ulcer of sacral region, stage 4: Secondary | ICD-10-CM | POA: Diagnosis not present

## 2019-03-27 DIAGNOSIS — D649 Anemia, unspecified: Secondary | ICD-10-CM | POA: Diagnosis not present

## 2019-03-30 DIAGNOSIS — Z1159 Encounter for screening for other viral diseases: Secondary | ICD-10-CM | POA: Diagnosis not present

## 2019-03-31 ENCOUNTER — Other Ambulatory Visit (INDEPENDENT_AMBULATORY_CARE_PROVIDER_SITE_OTHER): Payer: Self-pay

## 2019-03-31 ENCOUNTER — Telehealth (INDEPENDENT_AMBULATORY_CARE_PROVIDER_SITE_OTHER): Payer: Self-pay

## 2019-03-31 NOTE — Telephone Encounter (Signed)
Yesenia Shepard from [employer] called stating Dr Lovie Macadamia wanted to inform Dr Delana Meyer that the patient started taking Eliquis 5mg  for right le dvt. I called Dr Delana Meyer and he has been made aware with information.

## 2019-04-03 DIAGNOSIS — L89624 Pressure ulcer of left heel, stage 4: Secondary | ICD-10-CM | POA: Diagnosis not present

## 2019-04-03 DIAGNOSIS — Z872 Personal history of diseases of the skin and subcutaneous tissue: Secondary | ICD-10-CM | POA: Diagnosis not present

## 2019-04-03 DIAGNOSIS — L89154 Pressure ulcer of sacral region, stage 4: Secondary | ICD-10-CM | POA: Diagnosis not present

## 2019-04-07 ENCOUNTER — Telehealth: Payer: Self-pay | Admitting: Internal Medicine

## 2019-04-07 NOTE — Chronic Care Management (AMB) (Signed)
°  Chronic Care Management   Outreach Note  04/07/2019 Name: Yesenia Shepard MRN: [id number] DOB: [birth year]  Referred by: [name], [name], MD Reason for referral : Chronic Care Management (Initial CCM outreach was unsuccessful. )   An unsuccessful telephone outreach was attempted today. The patient was referred to the case management team by for assistance with care management and care coordination.   Follow Up Plan: A HIPPA compliant phone message was left for the patient providing contact information and requesting a return call.  The care management team will reach out to the patient again over the next 7 days.  If patient returns call to provider office, please advise to call [place] at [place]  [email or web address]   ??[phone]

## 2019-04-10 DIAGNOSIS — Z872 Personal history of diseases of the skin and subcutaneous tissue: Secondary | ICD-10-CM | POA: Diagnosis not present

## 2019-04-10 DIAGNOSIS — L89154 Pressure ulcer of sacral region, stage 4: Secondary | ICD-10-CM | POA: Diagnosis not present

## 2019-04-10 DIAGNOSIS — L89624 Pressure ulcer of left heel, stage 4: Secondary | ICD-10-CM | POA: Diagnosis not present

## 2019-04-14 DIAGNOSIS — L89120 Pressure ulcer of left upper back, unstageable: Secondary | ICD-10-CM | POA: Diagnosis not present

## 2019-04-14 DIAGNOSIS — R262 Difficulty in walking, not elsewhere classified: Secondary | ICD-10-CM | POA: Diagnosis not present

## 2019-04-14 DIAGNOSIS — F419 Anxiety disorder, unspecified: Secondary | ICD-10-CM | POA: Diagnosis not present

## 2019-04-14 DIAGNOSIS — K7460 Unspecified cirrhosis of liver: Secondary | ICD-10-CM | POA: Diagnosis not present

## 2019-04-14 DIAGNOSIS — G4700 Insomnia, unspecified: Secondary | ICD-10-CM | POA: Diagnosis not present

## 2019-04-14 DIAGNOSIS — N184 Chronic kidney disease, stage 4 (severe): Secondary | ICD-10-CM | POA: Diagnosis not present

## 2019-04-14 DIAGNOSIS — R1312 Dysphagia, oropharyngeal phase: Secondary | ICD-10-CM | POA: Diagnosis not present

## 2019-04-14 DIAGNOSIS — F0150 Vascular dementia without behavioral disturbance: Secondary | ICD-10-CM | POA: Diagnosis not present

## 2019-04-14 DIAGNOSIS — M6281 Muscle weakness (generalized): Secondary | ICD-10-CM | POA: Diagnosis not present

## 2019-04-14 DIAGNOSIS — Z23 Encounter for immunization: Secondary | ICD-10-CM | POA: Diagnosis not present

## 2019-04-14 DIAGNOSIS — R2681 Unsteadiness on feet: Secondary | ICD-10-CM | POA: Diagnosis not present

## 2019-04-14 DIAGNOSIS — F1920 Other psychoactive substance dependence, uncomplicated: Secondary | ICD-10-CM | POA: Diagnosis not present

## 2019-04-14 DIAGNOSIS — L89150 Pressure ulcer of sacral region, unstageable: Secondary | ICD-10-CM | POA: Diagnosis not present

## 2019-04-14 DIAGNOSIS — R488 Other symbolic dysfunctions: Secondary | ICD-10-CM | POA: Diagnosis not present

## 2019-04-14 DIAGNOSIS — E1122 Type 2 diabetes mellitus with diabetic chronic kidney disease: Secondary | ICD-10-CM | POA: Diagnosis not present

## 2019-04-14 DIAGNOSIS — R498 Other voice and resonance disorders: Secondary | ICD-10-CM | POA: Diagnosis not present

## 2019-04-14 DIAGNOSIS — F331 Major depressive disorder, recurrent, moderate: Secondary | ICD-10-CM | POA: Diagnosis not present

## 2019-04-14 NOTE — Chronic Care Management (AMB) (Signed)
°  Chronic Care Management   Outreach Note  04/14/2019 Name: Yesenia Shepard MRN: [id number] DOB: [birth year]  Referred by: [name], [name], MD Reason for referral : Chronic Care Management (Initial CCM outreach was unsuccessful. ) and Chronic Care Management (Second CCM outreach was unsuccessful)   A second unsuccessful telephone outreach was attempted today. The patient was referred to the case management team for assistance with care management and care coordination.   Follow Up Plan: A HIPPA compliant phone message was left for the patient providing contact information and requesting a return call.  The care management team will reach out to the patient again over the next 7 days.  If patient returns call to provider office, please advise to call [place] at [place]  [email or web address]   ??[phone]

## 2019-04-17 DIAGNOSIS — L89154 Pressure ulcer of sacral region, stage 4: Secondary | ICD-10-CM | POA: Diagnosis not present

## 2019-04-17 DIAGNOSIS — Z872 Personal history of diseases of the skin and subcutaneous tissue: Secondary | ICD-10-CM | POA: Diagnosis not present

## 2019-04-17 DIAGNOSIS — L89624 Pressure ulcer of left heel, stage 4: Secondary | ICD-10-CM | POA: Diagnosis not present

## 2019-04-19 DIAGNOSIS — F419 Anxiety disorder, unspecified: Secondary | ICD-10-CM | POA: Diagnosis not present

## 2019-04-19 DIAGNOSIS — D509 Iron deficiency anemia, unspecified: Secondary | ICD-10-CM | POA: Diagnosis not present

## 2019-04-19 DIAGNOSIS — L89154 Pressure ulcer of sacral region, stage 4: Secondary | ICD-10-CM | POA: Diagnosis not present

## 2019-04-19 DIAGNOSIS — E785 Hyperlipidemia, unspecified: Secondary | ICD-10-CM | POA: Diagnosis not present

## 2019-04-19 DIAGNOSIS — Z7901 Long term (current) use of anticoagulants: Secondary | ICD-10-CM | POA: Diagnosis not present

## 2019-04-19 DIAGNOSIS — I82409 Acute embolism and thrombosis of unspecified deep veins of unspecified lower extremity: Secondary | ICD-10-CM | POA: Diagnosis not present

## 2019-04-19 DIAGNOSIS — I739 Peripheral vascular disease, unspecified: Secondary | ICD-10-CM | POA: Diagnosis not present

## 2019-04-19 DIAGNOSIS — K5900 Constipation, unspecified: Secondary | ICD-10-CM | POA: Diagnosis not present

## 2019-04-19 DIAGNOSIS — G8929 Other chronic pain: Secondary | ICD-10-CM | POA: Diagnosis not present

## 2019-04-20 NOTE — Chronic Care Management (AMB) (Signed)
°  Chronic Care Management   Outreach Note  04/20/2019 Name: Yesenia Shepard MRN: [id number] DOB: 1951/03/03  Referred by: [name], [name], MD Reason for referral : Chronic Care Management (Initial CCM outreach was unsuccessful. ), Chronic Care Management (Second CCM outreach was unsuccessful), and Chronic Care Management (Third CCM outreach was unsuccessful.)   Third unsuccessful telephone outreach was attempted today. The patient was referred to the case management team for assistance with care management and care coordination. The patient's primary care provider has been notified of our unsuccessful attempts to make or maintain contact with the patient. The care management team is pleased to engage with this patient at any time in the future should he/she be interested in assistance from the care management team.   Follow Up Plan: The care management team is available to follow up with the patient after provider conversation with the patient regarding recommendation for care management engagement and subsequent re-referral to the care management team.   [place]  [email or web address]   ??[phone]

## 2019-04-21 DIAGNOSIS — Z1159 Encounter for screening for other viral diseases: Secondary | ICD-10-CM | POA: Diagnosis not present

## 2019-04-26 ENCOUNTER — Encounter: Payer: Self-pay | Admitting: General Surgery

## 2019-05-19 DIAGNOSIS — F0150 Vascular dementia without behavioral disturbance: Secondary | ICD-10-CM | POA: Diagnosis not present

## 2019-05-19 DIAGNOSIS — F1920 Other psychoactive substance dependence, uncomplicated: Secondary | ICD-10-CM | POA: Diagnosis not present

## 2019-05-19 DIAGNOSIS — F331 Major depressive disorder, recurrent, moderate: Secondary | ICD-10-CM | POA: Diagnosis not present

## 2019-05-19 DIAGNOSIS — F419 Anxiety disorder, unspecified: Secondary | ICD-10-CM | POA: Diagnosis not present

## 2019-05-19 DIAGNOSIS — K7460 Unspecified cirrhosis of liver: Secondary | ICD-10-CM | POA: Diagnosis not present

## 2019-05-19 DIAGNOSIS — E1122 Type 2 diabetes mellitus with diabetic chronic kidney disease: Secondary | ICD-10-CM | POA: Diagnosis not present

## 2019-05-19 DIAGNOSIS — N184 Chronic kidney disease, stage 4 (severe): Secondary | ICD-10-CM | POA: Diagnosis not present

## 2019-05-19 DIAGNOSIS — G4700 Insomnia, unspecified: Secondary | ICD-10-CM | POA: Diagnosis not present

## 2019-05-22 DIAGNOSIS — L89154 Pressure ulcer of sacral region, stage 4: Secondary | ICD-10-CM | POA: Diagnosis not present

## 2019-05-22 DIAGNOSIS — Z872 Personal history of diseases of the skin and subcutaneous tissue: Secondary | ICD-10-CM | POA: Diagnosis not present

## 2019-05-26 DIAGNOSIS — K7460 Unspecified cirrhosis of liver: Secondary | ICD-10-CM | POA: Diagnosis not present

## 2019-05-26 DIAGNOSIS — N184 Chronic kidney disease, stage 4 (severe): Secondary | ICD-10-CM | POA: Diagnosis not present

## 2019-05-26 DIAGNOSIS — F419 Anxiety disorder, unspecified: Secondary | ICD-10-CM | POA: Diagnosis not present

## 2019-05-26 DIAGNOSIS — E1122 Type 2 diabetes mellitus with diabetic chronic kidney disease: Secondary | ICD-10-CM | POA: Diagnosis not present

## 2019-05-26 DIAGNOSIS — F331 Major depressive disorder, recurrent, moderate: Secondary | ICD-10-CM | POA: Diagnosis not present

## 2019-05-26 DIAGNOSIS — F1920 Other psychoactive substance dependence, uncomplicated: Secondary | ICD-10-CM | POA: Diagnosis not present

## 2019-05-26 DIAGNOSIS — F0150 Vascular dementia without behavioral disturbance: Secondary | ICD-10-CM | POA: Diagnosis not present

## 2019-05-26 DIAGNOSIS — G4700 Insomnia, unspecified: Secondary | ICD-10-CM | POA: Diagnosis not present

## 2019-05-27 DIAGNOSIS — Z8619 Personal history of other infectious and parasitic diseases: Secondary | ICD-10-CM | POA: Diagnosis not present

## 2019-05-27 DIAGNOSIS — R2681 Unsteadiness on feet: Secondary | ICD-10-CM | POA: Diagnosis not present

## 2019-05-27 DIAGNOSIS — R1312 Dysphagia, oropharyngeal phase: Secondary | ICD-10-CM | POA: Diagnosis not present

## 2019-05-27 DIAGNOSIS — L89120 Pressure ulcer of left upper back, unstageable: Secondary | ICD-10-CM | POA: Diagnosis not present

## 2019-05-27 DIAGNOSIS — L89150 Pressure ulcer of sacral region, unstageable: Secondary | ICD-10-CM | POA: Diagnosis not present

## 2019-05-27 DIAGNOSIS — U071 COVID-19: Secondary | ICD-10-CM | POA: Diagnosis not present

## 2019-05-27 DIAGNOSIS — F329 Major depressive disorder, single episode, unspecified: Secondary | ICD-10-CM | POA: Diagnosis not present

## 2019-05-27 DIAGNOSIS — L89154 Pressure ulcer of sacral region, stage 4: Secondary | ICD-10-CM | POA: Diagnosis not present

## 2019-05-27 DIAGNOSIS — F419 Anxiety disorder, unspecified: Secondary | ICD-10-CM | POA: Diagnosis not present

## 2019-05-27 DIAGNOSIS — M24571 Contracture, right ankle: Secondary | ICD-10-CM | POA: Diagnosis not present

## 2019-05-27 DIAGNOSIS — Z7901 Long term (current) use of anticoagulants: Secondary | ICD-10-CM | POA: Diagnosis not present

## 2019-05-27 DIAGNOSIS — R498 Other voice and resonance disorders: Secondary | ICD-10-CM | POA: Diagnosis not present

## 2019-05-27 DIAGNOSIS — I10 Essential (primary) hypertension: Secondary | ICD-10-CM | POA: Diagnosis not present

## 2019-05-27 DIAGNOSIS — M24572 Contracture, left ankle: Secondary | ICD-10-CM | POA: Diagnosis not present

## 2019-05-27 DIAGNOSIS — D509 Iron deficiency anemia, unspecified: Secondary | ICD-10-CM | POA: Diagnosis not present

## 2019-05-27 DIAGNOSIS — I82409 Acute embolism and thrombosis of unspecified deep veins of unspecified lower extremity: Secondary | ICD-10-CM | POA: Diagnosis not present

## 2019-05-27 DIAGNOSIS — I739 Peripheral vascular disease, unspecified: Secondary | ICD-10-CM | POA: Diagnosis not present

## 2019-05-27 DIAGNOSIS — R488 Other symbolic dysfunctions: Secondary | ICD-10-CM | POA: Diagnosis not present

## 2019-05-28 DIAGNOSIS — L89150 Pressure ulcer of sacral region, unstageable: Secondary | ICD-10-CM | POA: Diagnosis not present

## 2019-05-28 DIAGNOSIS — M24571 Contracture, right ankle: Secondary | ICD-10-CM | POA: Diagnosis not present

## 2019-05-28 DIAGNOSIS — U071 COVID-19: Secondary | ICD-10-CM | POA: Diagnosis not present

## 2019-05-28 DIAGNOSIS — R2681 Unsteadiness on feet: Secondary | ICD-10-CM | POA: Diagnosis not present

## 2019-05-28 DIAGNOSIS — M24572 Contracture, left ankle: Secondary | ICD-10-CM | POA: Diagnosis not present

## 2019-05-28 DIAGNOSIS — R488 Other symbolic dysfunctions: Secondary | ICD-10-CM | POA: Diagnosis not present

## 2019-05-28 DIAGNOSIS — R498 Other voice and resonance disorders: Secondary | ICD-10-CM | POA: Diagnosis not present

## 2019-05-28 DIAGNOSIS — R1312 Dysphagia, oropharyngeal phase: Secondary | ICD-10-CM | POA: Diagnosis not present

## 2019-05-28 DIAGNOSIS — L89120 Pressure ulcer of left upper back, unstageable: Secondary | ICD-10-CM | POA: Diagnosis not present

## 2019-05-29 DIAGNOSIS — L89150 Pressure ulcer of sacral region, unstageable: Secondary | ICD-10-CM | POA: Diagnosis not present

## 2019-05-29 DIAGNOSIS — L89120 Pressure ulcer of left upper back, unstageable: Secondary | ICD-10-CM | POA: Diagnosis not present

## 2019-05-29 DIAGNOSIS — R1312 Dysphagia, oropharyngeal phase: Secondary | ICD-10-CM | POA: Diagnosis not present

## 2019-05-29 DIAGNOSIS — M24571 Contracture, right ankle: Secondary | ICD-10-CM | POA: Diagnosis not present

## 2019-05-29 DIAGNOSIS — R488 Other symbolic dysfunctions: Secondary | ICD-10-CM | POA: Diagnosis not present

## 2019-05-29 DIAGNOSIS — L89154 Pressure ulcer of sacral region, stage 4: Secondary | ICD-10-CM | POA: Diagnosis not present

## 2019-05-29 DIAGNOSIS — R498 Other voice and resonance disorders: Secondary | ICD-10-CM | POA: Diagnosis not present

## 2019-05-29 DIAGNOSIS — M24572 Contracture, left ankle: Secondary | ICD-10-CM | POA: Diagnosis not present

## 2019-05-29 DIAGNOSIS — U071 COVID-19: Secondary | ICD-10-CM | POA: Diagnosis not present

## 2019-05-29 DIAGNOSIS — Z872 Personal history of diseases of the skin and subcutaneous tissue: Secondary | ICD-10-CM | POA: Diagnosis not present

## 2019-05-29 DIAGNOSIS — R2681 Unsteadiness on feet: Secondary | ICD-10-CM | POA: Diagnosis not present

## 2019-06-01 DIAGNOSIS — M24572 Contracture, left ankle: Secondary | ICD-10-CM | POA: Diagnosis not present

## 2019-06-01 DIAGNOSIS — R2681 Unsteadiness on feet: Secondary | ICD-10-CM | POA: Diagnosis not present

## 2019-06-01 DIAGNOSIS — U071 COVID-19: Secondary | ICD-10-CM | POA: Diagnosis not present

## 2019-06-01 DIAGNOSIS — L89120 Pressure ulcer of left upper back, unstageable: Secondary | ICD-10-CM | POA: Diagnosis not present

## 2019-06-01 DIAGNOSIS — R498 Other voice and resonance disorders: Secondary | ICD-10-CM | POA: Diagnosis not present

## 2019-06-01 DIAGNOSIS — L89150 Pressure ulcer of sacral region, unstageable: Secondary | ICD-10-CM | POA: Diagnosis not present

## 2019-06-01 DIAGNOSIS — R1312 Dysphagia, oropharyngeal phase: Secondary | ICD-10-CM | POA: Diagnosis not present

## 2019-06-01 DIAGNOSIS — M24571 Contracture, right ankle: Secondary | ICD-10-CM | POA: Diagnosis not present

## 2019-06-01 DIAGNOSIS — R488 Other symbolic dysfunctions: Secondary | ICD-10-CM | POA: Diagnosis not present

## 2019-06-02 DIAGNOSIS — R1312 Dysphagia, oropharyngeal phase: Secondary | ICD-10-CM | POA: Diagnosis not present

## 2019-06-02 DIAGNOSIS — M24572 Contracture, left ankle: Secondary | ICD-10-CM | POA: Diagnosis not present

## 2019-06-02 DIAGNOSIS — R2681 Unsteadiness on feet: Secondary | ICD-10-CM | POA: Diagnosis not present

## 2019-06-02 DIAGNOSIS — L89120 Pressure ulcer of left upper back, unstageable: Secondary | ICD-10-CM | POA: Diagnosis not present

## 2019-06-02 DIAGNOSIS — R488 Other symbolic dysfunctions: Secondary | ICD-10-CM | POA: Diagnosis not present

## 2019-06-02 DIAGNOSIS — U071 COVID-19: Secondary | ICD-10-CM | POA: Diagnosis not present

## 2019-06-02 DIAGNOSIS — M24571 Contracture, right ankle: Secondary | ICD-10-CM | POA: Diagnosis not present

## 2019-06-02 DIAGNOSIS — L89150 Pressure ulcer of sacral region, unstageable: Secondary | ICD-10-CM | POA: Diagnosis not present

## 2019-06-02 DIAGNOSIS — R498 Other voice and resonance disorders: Secondary | ICD-10-CM | POA: Diagnosis not present

## 2019-06-03 DIAGNOSIS — L89120 Pressure ulcer of left upper back, unstageable: Secondary | ICD-10-CM | POA: Diagnosis not present

## 2019-06-03 DIAGNOSIS — M24571 Contracture, right ankle: Secondary | ICD-10-CM | POA: Diagnosis not present

## 2019-06-03 DIAGNOSIS — R498 Other voice and resonance disorders: Secondary | ICD-10-CM | POA: Diagnosis not present

## 2019-06-03 DIAGNOSIS — L89154 Pressure ulcer of sacral region, stage 4: Secondary | ICD-10-CM | POA: Diagnosis not present

## 2019-06-03 DIAGNOSIS — R1312 Dysphagia, oropharyngeal phase: Secondary | ICD-10-CM | POA: Diagnosis not present

## 2019-06-03 DIAGNOSIS — R2681 Unsteadiness on feet: Secondary | ICD-10-CM | POA: Diagnosis not present

## 2019-06-03 DIAGNOSIS — L89150 Pressure ulcer of sacral region, unstageable: Secondary | ICD-10-CM | POA: Diagnosis not present

## 2019-06-03 DIAGNOSIS — M24572 Contracture, left ankle: Secondary | ICD-10-CM | POA: Diagnosis not present

## 2019-06-03 DIAGNOSIS — R488 Other symbolic dysfunctions: Secondary | ICD-10-CM | POA: Diagnosis not present

## 2019-06-03 DIAGNOSIS — U071 COVID-19: Secondary | ICD-10-CM | POA: Diagnosis not present

## 2019-06-10 DIAGNOSIS — L89154 Pressure ulcer of sacral region, stage 4: Secondary | ICD-10-CM | POA: Diagnosis not present

## 2019-06-16 DIAGNOSIS — K7460 Unspecified cirrhosis of liver: Secondary | ICD-10-CM | POA: Diagnosis not present

## 2019-06-16 DIAGNOSIS — F331 Major depressive disorder, recurrent, moderate: Secondary | ICD-10-CM | POA: Diagnosis not present

## 2019-06-16 DIAGNOSIS — N184 Chronic kidney disease, stage 4 (severe): Secondary | ICD-10-CM | POA: Diagnosis not present

## 2019-06-16 DIAGNOSIS — E1122 Type 2 diabetes mellitus with diabetic chronic kidney disease: Secondary | ICD-10-CM | POA: Diagnosis not present

## 2019-06-16 DIAGNOSIS — F0150 Vascular dementia without behavioral disturbance: Secondary | ICD-10-CM | POA: Diagnosis not present

## 2019-06-16 DIAGNOSIS — F419 Anxiety disorder, unspecified: Secondary | ICD-10-CM | POA: Diagnosis not present

## 2019-06-16 DIAGNOSIS — F1920 Other psychoactive substance dependence, uncomplicated: Secondary | ICD-10-CM | POA: Diagnosis not present

## 2019-06-16 DIAGNOSIS — G4700 Insomnia, unspecified: Secondary | ICD-10-CM | POA: Diagnosis not present

## 2019-06-17 DIAGNOSIS — L89154 Pressure ulcer of sacral region, stage 4: Secondary | ICD-10-CM | POA: Diagnosis not present

## 2019-06-17 DIAGNOSIS — L98419 Non-pressure chronic ulcer of buttock with unspecified severity: Secondary | ICD-10-CM | POA: Diagnosis not present

## 2019-06-23 DIAGNOSIS — D649 Anemia, unspecified: Secondary | ICD-10-CM | POA: Diagnosis not present

## 2019-06-23 DIAGNOSIS — R319 Hematuria, unspecified: Secondary | ICD-10-CM | POA: Diagnosis not present

## 2019-06-23 DIAGNOSIS — N390 Urinary tract infection, site not specified: Secondary | ICD-10-CM | POA: Diagnosis not present

## 2019-06-24 DIAGNOSIS — F419 Anxiety disorder, unspecified: Secondary | ICD-10-CM | POA: Diagnosis not present

## 2019-06-24 DIAGNOSIS — L89154 Pressure ulcer of sacral region, stage 4: Secondary | ICD-10-CM | POA: Diagnosis not present

## 2019-06-24 DIAGNOSIS — G8929 Other chronic pain: Secondary | ICD-10-CM | POA: Diagnosis not present

## 2019-06-24 DIAGNOSIS — F329 Major depressive disorder, single episode, unspecified: Secondary | ICD-10-CM | POA: Diagnosis not present

## 2019-06-24 DIAGNOSIS — K5900 Constipation, unspecified: Secondary | ICD-10-CM | POA: Diagnosis not present

## 2019-06-24 DIAGNOSIS — I82409 Acute embolism and thrombosis of unspecified deep veins of unspecified lower extremity: Secondary | ICD-10-CM | POA: Diagnosis not present

## 2019-06-24 DIAGNOSIS — D509 Iron deficiency anemia, unspecified: Secondary | ICD-10-CM | POA: Diagnosis not present

## 2019-06-24 DIAGNOSIS — I739 Peripheral vascular disease, unspecified: Secondary | ICD-10-CM | POA: Diagnosis not present

## 2019-06-24 DIAGNOSIS — Z7901 Long term (current) use of anticoagulants: Secondary | ICD-10-CM | POA: Diagnosis not present

## 2019-07-21 ENCOUNTER — Other Ambulatory Visit: Payer: Self-pay

## 2019-07-21 ENCOUNTER — Ambulatory Visit (INDEPENDENT_AMBULATORY_CARE_PROVIDER_SITE_OTHER): Payer: Medicare HMO

## 2019-07-21 VITALS — Ht 66.0 in | Wt 120.0 lb

## 2019-07-21 DIAGNOSIS — Z0000 Encounter for general adult medical examination without abnormal findings: Secondary | ICD-10-CM | POA: Diagnosis not present

## 2019-07-21 DIAGNOSIS — Z1159 Encounter for screening for other viral diseases: Secondary | ICD-10-CM | POA: Diagnosis not present

## 2019-07-21 NOTE — Patient Instructions (Addendum)
  Yesenia Shepard , Thank you for taking time to come for your Medicare Wellness Visit. I appreciate your ongoing commitment to your health goals. Please review the following plan we discussed and let me know if I can assist you in the future.   These are the goals we discussed: Goals    . Follow up with Primary Care Provider     Schedule as needed       This is a list of the screening recommended for you and due dates:  Health Maintenance  Topic Date Due  . Hemoglobin A1C  10/10/2018  . Eye exam for diabetics  07/21/2019*  . Flu Shot  10/07/2019*  . Mammogram  07/20/2020*  . DEXA scan (bone density measurement)  07/20/2020*  . Tetanus Vaccine  07/20/2020*  . Cologuard (Stool DNA test)  07/20/2020*  . Complete foot exam   10/24/2019  .  Hepatitis C: One time screening is recommended by Center for Disease Control  (CDC) for  adults born from [birth year] through 1966.   Completed  . Pneumonia vaccines  Completed  *Topic was postponed. The date shown is not the original due date.

## 2019-07-21 NOTE — Progress Notes (Addendum)
Subjective:   Yesenia Shepard is a 69 y.o. female who presents for Medicare Annual (Subsequent) preventive examination.  Review of Systems:  No ROS.  Medicare Wellness Virtual Visit.  Visual/audio telehealth visit, UTA vital signs.   Wt/Ht provided.  See social history for additional risk factors.  Cardiac Risk Factors include: advanced age (>44men, >107 women);diabetes mellitus;hypertension     Objective:     Vitals: Ht 5\' 6"  (1.676 m)   Wt 120 lb (54.4 kg)   BMI 19.37 kg/m   Body mass index is 19.37 kg/m.  Advanced Directives 07/21/2019 01/28/2019 01/28/2019 01/28/2019 12/26/2018 12/22/2018 11/06/2018  Does Patient Have a Medical Advance Directive? Yes Yes Yes No Yes Unable to assess, patient is non-responsive or altered mental status No  Type of Advance Directive Healthcare Power of [place] of facility DNR (pink MOST or yellow form) Out of facility DNR (pink MOST or yellow form) - [place] - -  Does patient want to make changes to medical advance directive? No - Patient declined No - Patient declined - - No - Patient declined - -  Would patient like information on creating a medical advance directive? - - - - - - No - Guardian declined    Tobacco Social History   Tobacco Use  Smoking Status Former Smoker   Packs/day: 0.50   Years: 30.00   Pack years: 15.00   Types: Cigarettes   Quit date: 07/25/2018   Years since quitting: 0.9  Smokeless Tobacco Never Used     Counseling given: Not Answered   Clinical Intake:  Pre-visit preparation completed: Yes        Diabetes: Yes(Followed by facility physician)  How often do you need to have someone help you when you read instructions, pamphlets, or other written materials from your doctor or pharmacy?: 1 - Never  Interpreter Needed?: No     Past Medical History:  Diagnosis Date   Anxiety    Arthritis    Chronic pain    Depression    Diabetes mellitus without complication ([place])    Drug abuse  ([place])    History of polysubstance abuse; currently on methadone.   Hepatitis    History of Hep "C". treated and cured with Harvoni   History of chicken pox    Hypertension    Panic attacks    Peripheral vascular disease ([place])    stent in place.    UTI (urinary tract infection)    History of   Past Surgical History:  Procedure Laterality Date   ABDOMINAL HYSTERECTOMY  1989   AMPUTATION TOE Left 10/26/2018   Procedure: 5TH RAY RESCTION;  Surgeon: Albertine Patricia, DPM;  Location: [facility];  Service: Podiatry;  Laterality: Left;   APPLICATION OF WOUND VAC N/A 02/05/2019   Procedure: APPLICATION OF WOUND VAC POSS DEBRIDEMENT;  Surgeon: Fredirick Maudlin, MD;  Location: [facility];  Service: General;  Laterality: N/A;   APPLICATION OF WOUND VAC  02/03/2019   Procedure: APPLICATION OF WOUND VAC;  Surgeon: Fredirick Maudlin, MD;  Location: [facility];  Service: General;;   CHOLECYSTECTOMY  1987   INTRACAPSULAR CATARACT EXTRACTION Left    IRRIGATION AND DEBRIDEMENT ABSCESS N/A 02/03/2019   Procedure: IRRIGATION AND DEBRIDEMENT SACRAL DECUBITUS;  Surgeon: Fredirick Maudlin, MD;  Location: [facility];  Service: General;  Laterality: N/A;   LOWER EXTREMITY ANGIOGRAPHY Left 12/17/2017   Procedure: LOWER EXTREMITY ANGIOGRAPHY;  Surgeon: Katha Cabal, MD;  Location: [facility];  Service: Cardiovascular;  Laterality: Left;   LOWER EXTREMITY ANGIOGRAPHY Left 10/24/2018   Procedure: LOWER EXTREMITY ANGIOGRAPHY;  Surgeon: Algernon Huxley, MD;  Location: [facility];  Service: Cardiovascular;  Laterality: Left;   PERIPHERAL VASCULAR CATHETERIZATION Left 05/04/2015   Procedure: Lower Extremity Angiography;  Surgeon: Katha Cabal, MD;  Location: [facility];  Service: Cardiovascular;  Laterality: Left;   PERIPHERAL VASCULAR CATHETERIZATION Left 05/04/2015   Procedure: Lower Extremity Intervention;  Surgeon: Katha Cabal, MD;  Location: [facility];  Service:  Cardiovascular;  Laterality: Left;   SHOULDER ARTHROSCOPY WITH OPEN ROTATOR CUFF REPAIR Right 01/16/2017   Procedure: SHOULDER ARTHROSCOPY WITH OPEN ROTATOR CUFF REPAIR;  Surgeon: Thornton Park, MD;  Location: [facility];  Service: Orthopedics;  Laterality: Right;   TONSILLECTOMY AND ADENOIDECTOMY  1971   Family History  Problem Relation Age of Onset   Arthritis Mother    Mental illness Mother        depression   Diabetes Mother    Cancer Mother        pancreatic   Cancer Father        colon   Heart disease Father    Diabetes Father    Cancer Daughter        lung   Social History   Socioeconomic History   Marital status: Divorced    Spouse name: Not on file   Number of children: Not on file   Years of education: Not on file   Highest education level: Not on file  Occupational History   Not on file  Tobacco Use   Smoking status: Former Smoker    Packs/day: 0.50    Years: 30.00    Pack years: 15.00    Types: Cigarettes    Quit date: 07/25/2018    Years since quitting: 0.9   Smokeless tobacco: Never Used  Substance and Sexual Activity   Alcohol use: No    Alcohol/week: 0.0 standard drinks   Drug use: Not Currently    Comment: Hx of.   Sexual activity: Not Currently  Other Topics Concern   Not on file  Social History Narrative   1 daughter died in 10-22-16    Lives at home not alone    Social Determinants of Health   Financial Resource Strain:    Difficulty of Paying Living Expenses: Not on file  Food Insecurity:    Worried About Charity fundraiser in the Last Year: Not on file   [employer] of Food in the Last Year: Not on file  Transportation Needs:    Lack of Transportation (Medical): Not on file   Lack of Transportation (Non-Medical): Not on file  Physical Activity:    Days of Exercise per Week: Not on file   Minutes of Exercise per Session: Not on file  Stress:    Feeling of Stress : Not on file  Social Connections:    Frequency of Communication with  Friends and Family: Not on file   Frequency of Social Gatherings with Friends and Family: Not on file   Attends Religious Services: Not on file   Active Member of Clubs or Organizations: Not on file   Attends Club or Organization Meetings: Not on file   Marital Status: Not on file    Outpatient Encounter Medications as of 07/21/2019  Medication Sig   acetaminophen (TYLENOL) 325 MG tablet Take 2 tablets (650 mg total) by mouth every 6 (six) hours as needed for mild pain (or  Fever >/= 101).   atorvastatin (LIPITOR) 40 MG tablet Take 1 tablet (40 mg total) by mouth daily.   bisacodyl (DULCOLAX) 5 MG EC tablet Take 1 tablet (5 mg total) by mouth daily as needed for moderate constipation.   cholecalciferol (VITAMIN D3) 25 MCG (1000 UT) tablet Take 1 tablet (1,000 Units total) by mouth daily.   clonazepam (KLONOPIN) 0.125 MG disintegrating tablet Take 0.125 mg by mouth 2 (two) times daily.   clopidogrel (PLAVIX) 75 MG tablet TAKE 1 TABLET BY MOUTH ONCE DAILY   docusate sodium (COLACE) 100 MG capsule Take 1 capsule (100 mg total) by mouth 2 (two) times daily.   ELIQUIS 5 MG TABS tablet    enoxaparin (LOVENOX) 60 MG/0.6ML injection    escitalopram (LEXAPRO) 5 MG tablet Take 1 tablet (5 mg total) by mouth daily.   famotidine (PEPCID) 20 MG tablet Take 1 tablet (20 mg total) by mouth daily.   feeding supplement, GLUCERNA SHAKE, (GLUCERNA SHAKE) LIQD Take 237 mLs by mouth 2 (two) times daily between meals. (Patient not taking: Reported on 07/21/2019)   ferrous sulfate 325 (65 FE) MG tablet Take 325 mg by mouth 2 (two) times daily.    HYDROcodone-acetaminophen (NORCO/VICODIN) 5-325 MG tablet Take 1 tablet by mouth every 6 (six) hours as needed for moderate pain.   lisinopril (PRINIVIL,ZESTRIL) 10 MG tablet Take 1 tablet (10 mg total) by mouth daily.   Melatonin 5 MG TABS Take 5 mg by mouth at bedtime.   methadone (DOLOPHINE) 10 MG/5ML solution Take 5 mg by mouth every 6 (six) hours as needed for pain.  Give 65 ml oral   Multiple Vitamins-Iron (MULTIVITAMINS WITH IRON) TABS tablet Take 1 tablet by mouth daily.   Polyethylene Glycol 3350 (MIRALAX PO) Take by mouth.   potassium chloride (K-DUR) 10 MEQ tablet Take 10 mEq by mouth daily.   traZODone (DESYREL) 50 MG tablet Take 50 mg by mouth at bedtime.   vitamin C (VITAMIN C) 250 MG tablet Take 1 tablet (250 mg total) by mouth 2 (two) times daily.   No facility-administered encounter medications on file as of 07/21/2019.    Activities of Daily Living In your present state of health, do you have any difficulty performing the following activities: 07/21/2019 01/28/2019  Hearing? N N  Vision? N N  Difficulty concentrating or making decisions? Y Y  Comment - -  Walking or climbing stairs? Y Y  Dressing or bathing? Y Y  Comment - -  Doing errands, shopping? [place] Donning  Preparing Food and eating ? Y -  Using the Toilet? Y -  In the past six months, have you accidently leaked urine? N -  Do you have problems with loss of bowel control? N -  Managing your Medications? Y -  Managing your Finances? N -  Housekeeping or managing your Housekeeping? Y -  Some recent data might be hidden    Patient Care Team: [name], [name], MD as PCP - General (Internal Medicine)    Assessment:   This is a routine wellness examination for [place].  Nurse connected with patient 07/21/19 at 10:30 AM EST by a telephone enabled telemedicine application and verified that I am speaking with the correct person using two identifiers. Patient stated full name and DOB. Patient gave permission to continue with virtual visit. Patient's location was at home and Nurse's location was at [place] office.   Patient is alert and oriented x3. Patient denies difficulty focusing or concentrating. Patient likes to read  for brain stimulation.   Health Maintenance Due: -Tdap and Shingles vaccine- discussed; to be completed with doctor in visit or local pharmacy.   -Dexa Scan-  deferred per patient preference, unable to complete at this time. -Mammogram- deferred per patient preference, unable to complete at this time.  -Eye Exam- last visit 2 years ago; unable to complete at this time.  -Glucose, capillary - 02/12/19 (108) -Cologuard- deferred for later in the season.  See completed HM at the end of note.   Eye: Visual acuity not assessed. Virtual visit. Retinopathy- none reported.  Dental: Dentures- yes  Hearing: Demonstrates normal hearing during visit.  Safety:  Patient feels safe at home- yes Patient does have smoke detectors at home- yes Patient does wear sunscreen or protective clothing when in direct sunlight - yes Patient does wear seat belt when in a moving vehicle - yes Patient drives- no Adequate lighting in walkways free from debris- yes Grab bars and handrails used as appropriate- yes Ambulates with an assistive device- no; no ambulation.  Social: Alcohol intake - no     Smoking history- former   Smokers in home? none Illicit drug use? none  Medication: Taking as directed and without issues.   Self managed - yes   Covid-19: Precautions and sickness symptoms discussed. Wears mask, social distancing, hand hygiene as appropriate.   Activities of Daily Living Staff aPatient denies needing assistance with: household chores, feeding themselves, getting from bed to chair, getting to the toilet, bathing/showering, dressing, managing money, or preparing meals.   Discussed the importance of a healthy diet, water intake and the benefits of aerobic exercise.  Educational material provided.  Physical activity- none.  Diet:  Diabetic  Water: fair intake Caffeine: no  Other Providers Patient Care Team: [name], [name], MD as PCP - General (Internal Medicine)  Exercise Activities and Dietary recommendations Current Exercise Habits: Home exercise routine, Intensity: Mild  Goals      Follow up with Primary Care Provider      Schedule as needed        Fall Risk Fall Risk  07/21/2019 03/12/2019 04/10/2018 09/20/2017 09/13/2016  Falls in the past year? Exclusion - non ambulatory 0 No Yes No  Number falls in past yr: - 0 - 1 -  Injury with Fall? - 0 - No -  Comment - - - She tripped while carrying the trash -  Risk Factor Category  - - - - -  Follow up - - - Education provided;Falls prevention discussed -   Timed Get Up and Go performed:   Depression Screen PHQ 2/9 Scores 07/21/2019 04/10/2018 09/20/2017 09/13/2016  PHQ - 2 Score 2 0 4 6  PHQ- 9 Score 2 - 12 11     Cognitive Function MMSE - Mini Mental State Exam 07/21/2019  Not completed: Refused     6CIT Screen 09/20/2017  What Year? 0 points  What month? 0 points  What time? 0 points  Count back from 20 0 points  Months in reverse 0 points    Immunization History  Administered Date(s) Administered   Hepatitis A, Adult 11/09/2015, 12/21/2015, 06/11/2016   Hepatitis B 06/11/2016   Hepatitis B, adult 11/09/2015, 12/21/2015   Influenza-Unspecified 03/09/2016   Pneumococcal Conjugate-13 08/19/2015   Pneumococcal Polysaccharide-23 01/29/2019   Screening Tests Health Maintenance  Topic Date Due   HEMOGLOBIN A1C  10/10/2018   OPHTHALMOLOGY EXAM  07/21/2019 (Originally 06/29/1961)   INFLUENZA VACCINE  10/07/2019 (Originally 02/07/2019)   MAMMOGRAM  07/20/2020 (  Originally 08/14/2016)   DEXA SCAN  07/20/2020 (Originally 06/30/2015)   TETANUS/TDAP  07/20/2020 (Originally 06/29/1969)   Fecal DNA (Cologuard)  07/20/2020 (Originally 06/29/2000)   FOOT EXAM  10/24/2019   Hepatitis C Screening  Completed   PNA vac Low Risk Adult  Completed      Plan:   Keep all routine maintenance appointments.   Encouraged to schedule a follow up with pcp as needed.   Medicare Attestation I have personally reviewed: The patient's medical and social history Their use of alcohol, tobacco or illicit drugs Their current medications and supplements The patient's functional  ability including ADLs,fall risks, home safety risks, cognitive, and hearing and visual impairment Diet and physical activities Evidence for depression   I have reviewed and discussed with patient certain preventive protocols, quality metrics, and best practice recommendations.     Varney Biles, LPN  [id number]    Agree  TMS

## 2019-07-28 DIAGNOSIS — E1122 Type 2 diabetes mellitus with diabetic chronic kidney disease: Secondary | ICD-10-CM | POA: Diagnosis not present

## 2019-07-28 DIAGNOSIS — F1920 Other psychoactive substance dependence, uncomplicated: Secondary | ICD-10-CM | POA: Diagnosis not present

## 2019-07-28 DIAGNOSIS — Z1159 Encounter for screening for other viral diseases: Secondary | ICD-10-CM | POA: Diagnosis not present

## 2019-07-28 DIAGNOSIS — F331 Major depressive disorder, recurrent, moderate: Secondary | ICD-10-CM | POA: Diagnosis not present

## 2019-07-28 DIAGNOSIS — N184 Chronic kidney disease, stage 4 (severe): Secondary | ICD-10-CM | POA: Diagnosis not present

## 2019-07-28 DIAGNOSIS — F0150 Vascular dementia without behavioral disturbance: Secondary | ICD-10-CM | POA: Diagnosis not present

## 2019-07-28 DIAGNOSIS — F419 Anxiety disorder, unspecified: Secondary | ICD-10-CM | POA: Diagnosis not present

## 2019-07-28 DIAGNOSIS — G4700 Insomnia, unspecified: Secondary | ICD-10-CM | POA: Diagnosis not present

## 2019-07-28 DIAGNOSIS — K7460 Unspecified cirrhosis of liver: Secondary | ICD-10-CM | POA: Diagnosis not present

## 2019-08-04 DIAGNOSIS — Z1159 Encounter for screening for other viral diseases: Secondary | ICD-10-CM | POA: Diagnosis not present

## 2019-08-05 DIAGNOSIS — K7460 Unspecified cirrhosis of liver: Secondary | ICD-10-CM | POA: Diagnosis not present

## 2019-08-05 DIAGNOSIS — N184 Chronic kidney disease, stage 4 (severe): Secondary | ICD-10-CM | POA: Diagnosis not present

## 2019-08-05 DIAGNOSIS — N390 Urinary tract infection, site not specified: Secondary | ICD-10-CM | POA: Diagnosis not present

## 2019-08-05 DIAGNOSIS — F1920 Other psychoactive substance dependence, uncomplicated: Secondary | ICD-10-CM | POA: Diagnosis not present

## 2019-08-05 DIAGNOSIS — F0150 Vascular dementia without behavioral disturbance: Secondary | ICD-10-CM | POA: Diagnosis not present

## 2019-08-05 DIAGNOSIS — F419 Anxiety disorder, unspecified: Secondary | ICD-10-CM | POA: Diagnosis not present

## 2019-08-05 DIAGNOSIS — G4700 Insomnia, unspecified: Secondary | ICD-10-CM | POA: Diagnosis not present

## 2019-08-05 DIAGNOSIS — F331 Major depressive disorder, recurrent, moderate: Secondary | ICD-10-CM | POA: Diagnosis not present

## 2019-08-05 DIAGNOSIS — R319 Hematuria, unspecified: Secondary | ICD-10-CM | POA: Diagnosis not present

## 2019-08-05 DIAGNOSIS — E1122 Type 2 diabetes mellitus with diabetic chronic kidney disease: Secondary | ICD-10-CM | POA: Diagnosis not present

## 2019-08-06 DIAGNOSIS — E785 Hyperlipidemia, unspecified: Secondary | ICD-10-CM | POA: Diagnosis not present

## 2019-08-06 DIAGNOSIS — D649 Anemia, unspecified: Secondary | ICD-10-CM | POA: Diagnosis not present

## 2019-08-06 DIAGNOSIS — G8929 Other chronic pain: Secondary | ICD-10-CM | POA: Diagnosis not present

## 2019-08-06 DIAGNOSIS — I82409 Acute embolism and thrombosis of unspecified deep veins of unspecified lower extremity: Secondary | ICD-10-CM | POA: Diagnosis not present

## 2019-08-06 DIAGNOSIS — I10 Essential (primary) hypertension: Secondary | ICD-10-CM | POA: Diagnosis not present

## 2019-08-06 DIAGNOSIS — Z8719 Personal history of other diseases of the digestive system: Secondary | ICD-10-CM | POA: Diagnosis not present

## 2019-08-06 DIAGNOSIS — L89154 Pressure ulcer of sacral region, stage 4: Secondary | ICD-10-CM | POA: Diagnosis not present

## 2019-08-06 DIAGNOSIS — I739 Peripheral vascular disease, unspecified: Secondary | ICD-10-CM | POA: Diagnosis not present

## 2019-08-06 DIAGNOSIS — F418 Other specified anxiety disorders: Secondary | ICD-10-CM | POA: Diagnosis not present

## 2019-08-11 DIAGNOSIS — Z1159 Encounter for screening for other viral diseases: Secondary | ICD-10-CM | POA: Diagnosis not present

## 2019-08-14 DIAGNOSIS — R319 Hematuria, unspecified: Secondary | ICD-10-CM | POA: Diagnosis not present

## 2019-08-14 DIAGNOSIS — N390 Urinary tract infection, site not specified: Secondary | ICD-10-CM | POA: Diagnosis not present

## 2019-08-18 DIAGNOSIS — Z1159 Encounter for screening for other viral diseases: Secondary | ICD-10-CM | POA: Diagnosis not present

## 2019-08-20 DIAGNOSIS — N390 Urinary tract infection, site not specified: Secondary | ICD-10-CM | POA: Diagnosis not present

## 2019-08-20 DIAGNOSIS — R319 Hematuria, unspecified: Secondary | ICD-10-CM | POA: Diagnosis not present

## 2019-08-20 DIAGNOSIS — Z79899 Other long term (current) drug therapy: Secondary | ICD-10-CM | POA: Diagnosis not present

## 2019-09-08 DIAGNOSIS — L89899 Pressure ulcer of other site, unspecified stage: Secondary | ICD-10-CM | POA: Diagnosis not present

## 2019-09-08 DIAGNOSIS — R569 Unspecified convulsions: Secondary | ICD-10-CM | POA: Diagnosis not present

## 2019-09-08 DIAGNOSIS — R748 Abnormal levels of other serum enzymes: Secondary | ICD-10-CM | POA: Diagnosis not present

## 2019-09-10 DIAGNOSIS — D508 Other iron deficiency anemias: Secondary | ICD-10-CM | POA: Diagnosis not present

## 2019-09-14 DIAGNOSIS — D500 Iron deficiency anemia secondary to blood loss (chronic): Secondary | ICD-10-CM | POA: Diagnosis not present

## 2019-09-17 ENCOUNTER — Ambulatory Visit (INDEPENDENT_AMBULATORY_CARE_PROVIDER_SITE_OTHER): Payer: Medicare HMO | Admitting: Nurse Practitioner

## 2019-09-17 ENCOUNTER — Other Ambulatory Visit: Payer: Self-pay

## 2019-09-17 ENCOUNTER — Ambulatory Visit (INDEPENDENT_AMBULATORY_CARE_PROVIDER_SITE_OTHER): Payer: Medicare HMO

## 2019-09-17 ENCOUNTER — Encounter (INDEPENDENT_AMBULATORY_CARE_PROVIDER_SITE_OTHER): Payer: Self-pay | Admitting: Nurse Practitioner

## 2019-09-17 VITALS — BP 96/44 | HR 63 | Resp 14

## 2019-09-17 DIAGNOSIS — I70213 Atherosclerosis of native arteries of extremities with intermittent claudication, bilateral legs: Secondary | ICD-10-CM

## 2019-09-17 DIAGNOSIS — E785 Hyperlipidemia, unspecified: Secondary | ICD-10-CM

## 2019-09-17 DIAGNOSIS — I739 Peripheral vascular disease, unspecified: Secondary | ICD-10-CM | POA: Diagnosis not present

## 2019-09-17 DIAGNOSIS — E119 Type 2 diabetes mellitus without complications: Secondary | ICD-10-CM | POA: Diagnosis not present

## 2019-09-17 DIAGNOSIS — Z89422 Acquired absence of other left toe(s): Secondary | ICD-10-CM | POA: Diagnosis not present

## 2019-09-22 ENCOUNTER — Encounter (INDEPENDENT_AMBULATORY_CARE_PROVIDER_SITE_OTHER): Payer: Self-pay | Admitting: Nurse Practitioner

## 2019-09-22 NOTE — Progress Notes (Signed)
SUBJECTIVE:  Patient ID: Yesenia Shepard, female    DOB: 1951/04/12, 69 y.o.   MRN: [id number] Chief Complaint  Patient presents with  . Follow-up    U/S Follow up    HPI  Yesenia Shepard is a 69 y.o. female The patient returns to the office for followup and review of the noninvasive studies. There have been no interval changes in lower extremity symptoms. No interval shortening of the patient's claudication distance or development of rest pain symptoms. No new ulcers or wounds have occurred since the last visit.  There have been no significant changes to the patient's overall health care.  The patient denies amaurosis fugax or recent TIA symptoms. There are no recent neurological changes noted. The patient denies history of DVT, PE or superficial thrombophlebitis. The patient denies recent episodes of angina or shortness of breath.   ABI Rt=1.22 and Lt=0.81  (previous ABI's Rt=1.08 and Lt=0.96) Duplex ultrasound of the right lower extremity reveals biphasic waveforms with monophasic in the left tibial arteries.  The patient has dampened toe waveforms bilaterally.  Past Medical History:  Diagnosis Date  . Anxiety   . Arthritis   . Chronic pain   . Depression   . Diabetes mellitus without complication ([place])   . Drug abuse ([place])    History of polysubstance abuse; currently on methadone.  . Hepatitis    History of Hep "C". treated and cured with Harvoni  . History of chicken pox   . Hypertension   . Panic attacks   . Peripheral vascular disease ([place])    stent in place.   Marland Kitchen UTI (urinary tract infection)    History of    Past Surgical History:  Procedure Laterality Date  . ABDOMINAL HYSTERECTOMY  1989  . AMPUTATION TOE Left 10/26/2018   Procedure: 5TH RAY RESCTION;  Surgeon: Albertine Patricia, DPM;  Location: [facility];  Service: Podiatry;  Laterality: Left;  . APPLICATION OF WOUND VAC N/A 02/05/2019   Procedure: APPLICATION OF WOUND VAC POSS DEBRIDEMENT;  Surgeon: Fredirick Maudlin,  MD;  Location: [facility];  Service: General;  Laterality: N/A;  . APPLICATION OF WOUND VAC  02/03/2019   Procedure: APPLICATION OF WOUND VAC;  Surgeon: Fredirick Maudlin, MD;  Location: [facility];  Service: General;;  . CHOLECYSTECTOMY  1987  . INTRACAPSULAR CATARACT EXTRACTION Left   . IRRIGATION AND DEBRIDEMENT ABSCESS N/A 02/03/2019   Procedure: IRRIGATION AND DEBRIDEMENT SACRAL DECUBITUS;  Surgeon: Fredirick Maudlin, MD;  Location: [facility];  Service: General;  Laterality: N/A;  . LOWER EXTREMITY ANGIOGRAPHY Left 12/17/2017   Procedure: LOWER EXTREMITY ANGIOGRAPHY;  Surgeon: Katha Cabal, MD;  Location: [place] [facility];  Service: Cardiovascular;  Laterality: Left;  . LOWER EXTREMITY ANGIOGRAPHY Left 10/24/2018   Procedure: LOWER EXTREMITY ANGIOGRAPHY;  Surgeon: Algernon Huxley, MD;  Location: [place] [facility];  Service: Cardiovascular;  Laterality: Left;  . PERIPHERAL VASCULAR CATHETERIZATION Left 05/04/2015   Procedure: Lower Extremity Angiography;  Surgeon: Katha Cabal, MD;  Location: [place] [facility];  Service: Cardiovascular;  Laterality: Left;  . PERIPHERAL VASCULAR CATHETERIZATION Left 05/04/2015   Procedure: Lower Extremity Intervention;  Surgeon: Katha Cabal, MD;  Location: [place] [facility];  Service: Cardiovascular;  Laterality: Left;  . SHOULDER ARTHROSCOPY WITH OPEN ROTATOR CUFF REPAIR Right 01/16/2017   Procedure: SHOULDER ARTHROSCOPY WITH OPEN ROTATOR CUFF REPAIR;  Surgeon: Thornton Park, MD;  Location: [facility];  Service: Orthopedics;  Laterality: Right;  . [place]  Social History   Socioeconomic History  . Marital status: Divorced    Spouse name: Not on file  . Number of children: Not on file  . Years of education: Not on file  . Highest education level: Not on file  Occupational History  . Not on file  Tobacco Use  . Smoking status: Former Smoker    Packs/day: 0.50    Years: 30.00    Pack years: 15.00     Types: Cigarettes    Quit date: 07/25/2018    Years since quitting: 1.1  . Smokeless tobacco: Never Used  Substance and Sexual Activity  . Alcohol use: No    Alcohol/week: 0.0 standard drinks  . Drug use: Not Currently    Comment: Hx of.  . Sexual activity: Not Currently  Other Topics Concern  . Not on file  Social History Narrative   1 daughter died in Oct 17, 2016    Lives at home not alone    Social Determinants of Health   Financial Resource Strain:   . Difficulty of Paying Living Expenses:   Food Insecurity:   . Worried About Charity fundraiser in the Last Year:   . Arboriculturist in the Last Year:   Transportation Needs:   . Film/video editor (Medical):   Marland Kitchen Lack of Transportation (Non-Medical):   Physical Activity:   . Days of Exercise per Week:   . Minutes of Exercise per Session:   Stress:   . Feeling of Stress :   Social Connections:   . Frequency of Communication with Friends and Family:   . Frequency of Social Gatherings with Friends and Family:   . Attends Religious Services:   . Active Member of Clubs or Organizations:   . Attends Archivist Meetings:   Marland Kitchen Marital Status:   Intimate Partner Violence:   . Fear of Current or Ex-Partner:   . Emotionally Abused:   Marland Kitchen Physically Abused:   . Sexually Abused:     Family History  Problem Relation Age of Onset  . Arthritis Mother   . Mental illness Mother        depression  . Diabetes Mother   . Cancer Mother        pancreatic  . Cancer Father        colon  . Heart disease Father   . Diabetes Father   . Cancer Daughter        lung    No Known Allergies   Review of Systems   Review of Systems: Negative Unless Checked Constitutional: [] Weight loss  [] Fever  [] Chills Cardiac: [] Chest pain   []  Atrial Fibrillation  [] Palpitations   [] Shortness of breath when laying flat   [] Shortness of breath with exertion. [] Shortness of breath at rest Vascular:  [] Pain in legs with walking   [] Pain in legs  with standing [] Pain in legs when laying flat   [] Claudication    [] Pain in feet when laying flat    [] History of DVT   [] Phlebitis   [] Swelling in legs   [] Varicose veins   [] Non-healing ulcers Pulmonary:   [] Uses home oxygen   [] Productive cough   [] Hemoptysis   [] Wheeze  [] COPD   [] Asthma Neurologic:  [] Dizziness   [] Seizures  [] Blackouts [] History of stroke   [] History of TIA  [] Aphasia   [] Temporary Blindness   [] Weakness or numbness in arm   [] Weakness or numbness in leg Musculoskeletal:   [] Joint swelling   [] Joint pain   []   Low back pain  []  History of Knee Replacement [x] Arthritis [] back Surgeries  []  Spinal Stenosis    Hematologic:  [] Easy bruising  [] Easy bleeding   [] Hypercoagulable state   [] Anemic Gastrointestinal:  [] Diarrhea   [] Vomiting  [] Gastroesophageal reflux/heartburn   [] Difficulty swallowing. [] Abdominal pain Genitourinary:  [x] Chronic kidney disease   [] Difficult urination  [] Anuric   [] Blood in urine [] Frequent urination  [] Burning with urination   [] Hematuria Skin:  [] Rashes   [] Ulcers [] Wounds Psychological:  [x] History of anxiety   [x]  History of major depression  [x]  Memory Difficulties      OBJECTIVE:   Physical Exam  BP (!) 96/44   Pulse 63   Resp 14   Gen: WD/WN, NAD Head: [place]/AT, No temporalis wasting.  Ear/Nose/Throat: Hearing grossly intact, nares w/o erythema or drainage Eyes: PER, EOMI, sclera nonicteric.  Neck: Supple, no masses.  No JVD.  Pulmonary:  Good air movement, no use of accessory muscles.  Cardiac: RRR Vascular:  Vessel Right Left  Radial Palpable Palpable  Dorsalis Pedis Palpable  not palpable  Posterior Tibial Palpable  not palpable   Gastrointestinal: soft, non-distended. No guarding/no peritoneal signs.  Musculoskeletal: Left fifth toe amputation, wheelchair-bound. Neurologic: Pain and light touch intact in extremities.  Symmetrical.  Speech is fluent. Motor exam as listed above. Psychiatric: Judgment intact, Mood & affect  appropriate for pt's clinical situation. Dermatologic: No Venous rashes. No Ulcers Noted.  No changes consistent with cellulitis. Lymph : No Cervical lymphadenopathy, no lichenification or skin changes of chronic lymphedema.       ASSESSMENT AND PLAN:  1. Atherosclerosis of native artery of both lower extremities with intermittent claudication (HCC)  Recommend:  The patient has evidence of atherosclerosis of the lower extremities with claudication.  The patient does not voice lifestyle limiting changes at this point in time.  Noninvasive studies do not suggest clinically significant change.  No invasive studies, angiography or surgery at this time The patient should continue walking and begin a more formal exercise program.  The patient should continue antiplatelet therapy and aggressive treatment of the lipid abnormalities  No changes in the patient's medications at this time  The patient should continue wearing graduated compression socks 10-15 mmHg strength to control the mild edema.    2. Hyperlipidemia, unspecified hyperlipidemia type Good lipid control is essential for atherosclerotic disease progression.  Patient appropriate medications.  No changes needed.   Current Outpatient Medications on File Prior to Visit  Medication Sig Dispense Refill  . acetaminophen (TYLENOL) 325 MG tablet Take 2 tablets (650 mg total) by mouth every 6 (six) hours as needed for mild pain (or Fever >/= 101).    Marland Kitchen atorvastatin (LIPITOR) 40 MG tablet Take 1 tablet (40 mg total) by mouth daily. 90 tablet 3  . bisacodyl (DULCOLAX) 5 MG EC tablet Take 1 tablet (5 mg total) by mouth daily as needed for moderate constipation. 30 tablet 0  . cholecalciferol (VITAMIN D3) 25 MCG (1000 UT) tablet Take 1 tablet (1,000 Units total) by mouth daily.    . clonazepam (KLONOPIN) 0.125 MG disintegrating tablet Take 0.125 mg by mouth 2 (two) times daily.    . clopidogrel (PLAVIX) 75 MG tablet TAKE 1 TABLET BY MOUTH  ONCE DAILY 30 tablet 11  . divalproex (DEPAKOTE SPRINKLE) 125 MG capsule     . docusate sodium (COLACE) 100 MG capsule Take 1 capsule (100 mg total) by mouth 2 (two) times daily. 30 capsule 0  . ELIQUIS 5 MG TABS tablet     .  enoxaparin (LOVENOX) 60 MG/0.6ML injection     . escitalopram (LEXAPRO) 5 MG tablet Take 1 tablet (5 mg total) by mouth daily.    . famotidine (PEPCID) 20 MG tablet Take 1 tablet (20 mg total) by mouth daily. 30 tablet 0  . feeding supplement, GLUCERNA SHAKE, (GLUCERNA SHAKE) LIQD Take 237 mLs by mouth 2 (two) times daily between meals.  0  . ferrous sulfate 325 (65 FE) MG tablet Take 325 mg by mouth 2 (two) times daily.     Marland Kitchen HYDROcodone-acetaminophen (NORCO/VICODIN) 5-325 MG tablet Take 1 tablet by mouth every 6 (six) hours as needed for moderate pain. 14 tablet 0  . KLOR-CON M10 10 MEQ tablet     . lisinopril (PRINIVIL,ZESTRIL) 10 MG tablet Take 1 tablet (10 mg total) by mouth daily. 90 tablet 3  . lisinopril (ZESTRIL) 20 MG tablet     . Melatonin 5 MG TABS Take 5 mg by mouth at bedtime.    . methadone (DOLOPHINE) 10 MG/5ML solution Take 5 mg by mouth every 6 (six) hours as needed for pain. Give 65 ml oral    . Multiple Vitamins-Iron (MULTIVITAMINS WITH IRON) TABS tablet Take 1 tablet by mouth daily.  0  . Polyethylene Glycol 3350 (MIRALAX PO) Take by mouth.    . potassium chloride (K-DUR) 10 MEQ tablet Take 10 mEq by mouth daily.    Marland Kitchen sulfamethoxazole-trimethoprim (BACTRIM DS) 800-160 MG tablet     . traZODone (DESYREL) 150 MG tablet     . traZODone (DESYREL) 50 MG tablet Take 50 mg by mouth at bedtime.    . vitamin C (VITAMIN C) 250 MG tablet Take 1 tablet (250 mg total) by mouth 2 (two) times daily.     No current facility-administered medications on file prior to visit.    There are no Patient Instructions on file for this visit. No follow-ups on file.   Kris Hartmann, NP  This note was completed with Sales executive.  Any errors are purely  unintentional.

## 2019-09-28 DIAGNOSIS — L89150 Pressure ulcer of sacral region, unstageable: Secondary | ICD-10-CM | POA: Diagnosis not present

## 2019-09-28 DIAGNOSIS — L89154 Pressure ulcer of sacral region, stage 4: Secondary | ICD-10-CM | POA: Diagnosis not present

## 2019-09-28 DIAGNOSIS — M6281 Muscle weakness (generalized): Secondary | ICD-10-CM | POA: Diagnosis not present

## 2019-09-28 DIAGNOSIS — L89120 Pressure ulcer of left upper back, unstageable: Secondary | ICD-10-CM | POA: Diagnosis not present

## 2019-09-28 DIAGNOSIS — R5381 Other malaise: Secondary | ICD-10-CM | POA: Diagnosis not present

## 2019-09-28 DIAGNOSIS — R498 Other voice and resonance disorders: Secondary | ICD-10-CM | POA: Diagnosis not present

## 2019-09-28 DIAGNOSIS — R52 Pain, unspecified: Secondary | ICD-10-CM | POA: Diagnosis not present

## 2019-09-28 DIAGNOSIS — R262 Difficulty in walking, not elsewhere classified: Secondary | ICD-10-CM | POA: Diagnosis not present

## 2019-09-28 DIAGNOSIS — L89610 Pressure ulcer of right heel, unstageable: Secondary | ICD-10-CM | POA: Diagnosis not present

## 2019-09-29 DIAGNOSIS — L89154 Pressure ulcer of sacral region, stage 4: Secondary | ICD-10-CM | POA: Diagnosis not present

## 2019-09-29 DIAGNOSIS — R262 Difficulty in walking, not elsewhere classified: Secondary | ICD-10-CM | POA: Diagnosis not present

## 2019-09-29 DIAGNOSIS — M6281 Muscle weakness (generalized): Secondary | ICD-10-CM | POA: Diagnosis not present

## 2019-09-29 DIAGNOSIS — L89150 Pressure ulcer of sacral region, unstageable: Secondary | ICD-10-CM | POA: Diagnosis not present

## 2019-09-29 DIAGNOSIS — R52 Pain, unspecified: Secondary | ICD-10-CM | POA: Diagnosis not present

## 2019-09-29 DIAGNOSIS — R5381 Other malaise: Secondary | ICD-10-CM | POA: Diagnosis not present

## 2019-09-29 DIAGNOSIS — L89120 Pressure ulcer of left upper back, unstageable: Secondary | ICD-10-CM | POA: Diagnosis not present

## 2019-09-29 DIAGNOSIS — L89610 Pressure ulcer of right heel, unstageable: Secondary | ICD-10-CM | POA: Diagnosis not present

## 2019-09-29 DIAGNOSIS — R498 Other voice and resonance disorders: Secondary | ICD-10-CM | POA: Diagnosis not present

## 2019-09-30 DIAGNOSIS — L89154 Pressure ulcer of sacral region, stage 4: Secondary | ICD-10-CM | POA: Diagnosis not present

## 2019-09-30 DIAGNOSIS — R5381 Other malaise: Secondary | ICD-10-CM | POA: Diagnosis not present

## 2019-09-30 DIAGNOSIS — R262 Difficulty in walking, not elsewhere classified: Secondary | ICD-10-CM | POA: Diagnosis not present

## 2019-09-30 DIAGNOSIS — L89610 Pressure ulcer of right heel, unstageable: Secondary | ICD-10-CM | POA: Diagnosis not present

## 2019-09-30 DIAGNOSIS — R498 Other voice and resonance disorders: Secondary | ICD-10-CM | POA: Diagnosis not present

## 2019-09-30 DIAGNOSIS — M6281 Muscle weakness (generalized): Secondary | ICD-10-CM | POA: Diagnosis not present

## 2019-09-30 DIAGNOSIS — L89150 Pressure ulcer of sacral region, unstageable: Secondary | ICD-10-CM | POA: Diagnosis not present

## 2019-09-30 DIAGNOSIS — L89120 Pressure ulcer of left upper back, unstageable: Secondary | ICD-10-CM | POA: Diagnosis not present

## 2019-09-30 DIAGNOSIS — R52 Pain, unspecified: Secondary | ICD-10-CM | POA: Diagnosis not present

## 2019-10-01 DIAGNOSIS — R5381 Other malaise: Secondary | ICD-10-CM | POA: Diagnosis not present

## 2019-10-01 DIAGNOSIS — I10 Essential (primary) hypertension: Secondary | ICD-10-CM | POA: Diagnosis not present

## 2019-10-01 DIAGNOSIS — R52 Pain, unspecified: Secondary | ICD-10-CM | POA: Diagnosis not present

## 2019-10-01 DIAGNOSIS — G8929 Other chronic pain: Secondary | ICD-10-CM | POA: Diagnosis not present

## 2019-10-01 DIAGNOSIS — L89610 Pressure ulcer of right heel, unstageable: Secondary | ICD-10-CM | POA: Diagnosis not present

## 2019-10-01 DIAGNOSIS — L89154 Pressure ulcer of sacral region, stage 4: Secondary | ICD-10-CM | POA: Diagnosis not present

## 2019-10-01 DIAGNOSIS — L89120 Pressure ulcer of left upper back, unstageable: Secondary | ICD-10-CM | POA: Diagnosis not present

## 2019-10-01 DIAGNOSIS — F418 Other specified anxiety disorders: Secondary | ICD-10-CM | POA: Diagnosis not present

## 2019-10-01 DIAGNOSIS — D649 Anemia, unspecified: Secondary | ICD-10-CM | POA: Diagnosis not present

## 2019-10-01 DIAGNOSIS — L89150 Pressure ulcer of sacral region, unstageable: Secondary | ICD-10-CM | POA: Diagnosis not present

## 2019-10-01 DIAGNOSIS — R498 Other voice and resonance disorders: Secondary | ICD-10-CM | POA: Diagnosis not present

## 2019-10-01 DIAGNOSIS — I82409 Acute embolism and thrombosis of unspecified deep veins of unspecified lower extremity: Secondary | ICD-10-CM | POA: Diagnosis not present

## 2019-10-01 DIAGNOSIS — M6281 Muscle weakness (generalized): Secondary | ICD-10-CM | POA: Diagnosis not present

## 2019-10-01 DIAGNOSIS — E785 Hyperlipidemia, unspecified: Secondary | ICD-10-CM | POA: Diagnosis not present

## 2019-10-01 DIAGNOSIS — Z8719 Personal history of other diseases of the digestive system: Secondary | ICD-10-CM | POA: Diagnosis not present

## 2019-10-01 DIAGNOSIS — R262 Difficulty in walking, not elsewhere classified: Secondary | ICD-10-CM | POA: Diagnosis not present

## 2019-10-01 DIAGNOSIS — I739 Peripheral vascular disease, unspecified: Secondary | ICD-10-CM | POA: Diagnosis not present

## 2019-10-02 DIAGNOSIS — L89120 Pressure ulcer of left upper back, unstageable: Secondary | ICD-10-CM | POA: Diagnosis not present

## 2019-10-02 DIAGNOSIS — L89610 Pressure ulcer of right heel, unstageable: Secondary | ICD-10-CM | POA: Diagnosis not present

## 2019-10-02 DIAGNOSIS — R52 Pain, unspecified: Secondary | ICD-10-CM | POA: Diagnosis not present

## 2019-10-02 DIAGNOSIS — L89154 Pressure ulcer of sacral region, stage 4: Secondary | ICD-10-CM | POA: Diagnosis not present

## 2019-10-02 DIAGNOSIS — R5381 Other malaise: Secondary | ICD-10-CM | POA: Diagnosis not present

## 2019-10-02 DIAGNOSIS — R498 Other voice and resonance disorders: Secondary | ICD-10-CM | POA: Diagnosis not present

## 2019-10-02 DIAGNOSIS — R262 Difficulty in walking, not elsewhere classified: Secondary | ICD-10-CM | POA: Diagnosis not present

## 2019-10-02 DIAGNOSIS — L89150 Pressure ulcer of sacral region, unstageable: Secondary | ICD-10-CM | POA: Diagnosis not present

## 2019-10-02 DIAGNOSIS — M6281 Muscle weakness (generalized): Secondary | ICD-10-CM | POA: Diagnosis not present

## 2019-10-05 DIAGNOSIS — R5381 Other malaise: Secondary | ICD-10-CM | POA: Diagnosis not present

## 2019-10-05 DIAGNOSIS — L89610 Pressure ulcer of right heel, unstageable: Secondary | ICD-10-CM | POA: Diagnosis not present

## 2019-10-05 DIAGNOSIS — L89150 Pressure ulcer of sacral region, unstageable: Secondary | ICD-10-CM | POA: Diagnosis not present

## 2019-10-05 DIAGNOSIS — L89154 Pressure ulcer of sacral region, stage 4: Secondary | ICD-10-CM | POA: Diagnosis not present

## 2019-10-05 DIAGNOSIS — M6281 Muscle weakness (generalized): Secondary | ICD-10-CM | POA: Diagnosis not present

## 2019-10-05 DIAGNOSIS — L89120 Pressure ulcer of left upper back, unstageable: Secondary | ICD-10-CM | POA: Diagnosis not present

## 2019-10-05 DIAGNOSIS — R262 Difficulty in walking, not elsewhere classified: Secondary | ICD-10-CM | POA: Diagnosis not present

## 2019-10-05 DIAGNOSIS — R498 Other voice and resonance disorders: Secondary | ICD-10-CM | POA: Diagnosis not present

## 2019-10-05 DIAGNOSIS — R52 Pain, unspecified: Secondary | ICD-10-CM | POA: Diagnosis not present

## 2019-10-06 DIAGNOSIS — L89610 Pressure ulcer of right heel, unstageable: Secondary | ICD-10-CM | POA: Diagnosis not present

## 2019-10-06 DIAGNOSIS — L89120 Pressure ulcer of left upper back, unstageable: Secondary | ICD-10-CM | POA: Diagnosis not present

## 2019-10-06 DIAGNOSIS — L89150 Pressure ulcer of sacral region, unstageable: Secondary | ICD-10-CM | POA: Diagnosis not present

## 2019-10-06 DIAGNOSIS — R52 Pain, unspecified: Secondary | ICD-10-CM | POA: Diagnosis not present

## 2019-10-06 DIAGNOSIS — R5381 Other malaise: Secondary | ICD-10-CM | POA: Diagnosis not present

## 2019-10-06 DIAGNOSIS — M6281 Muscle weakness (generalized): Secondary | ICD-10-CM | POA: Diagnosis not present

## 2019-10-06 DIAGNOSIS — L89154 Pressure ulcer of sacral region, stage 4: Secondary | ICD-10-CM | POA: Diagnosis not present

## 2019-10-06 DIAGNOSIS — R262 Difficulty in walking, not elsewhere classified: Secondary | ICD-10-CM | POA: Diagnosis not present

## 2019-10-06 DIAGNOSIS — R498 Other voice and resonance disorders: Secondary | ICD-10-CM | POA: Diagnosis not present

## 2019-10-07 DIAGNOSIS — R262 Difficulty in walking, not elsewhere classified: Secondary | ICD-10-CM | POA: Diagnosis not present

## 2019-10-07 DIAGNOSIS — R498 Other voice and resonance disorders: Secondary | ICD-10-CM | POA: Diagnosis not present

## 2019-10-07 DIAGNOSIS — L89120 Pressure ulcer of left upper back, unstageable: Secondary | ICD-10-CM | POA: Diagnosis not present

## 2019-10-07 DIAGNOSIS — L89154 Pressure ulcer of sacral region, stage 4: Secondary | ICD-10-CM | POA: Diagnosis not present

## 2019-10-07 DIAGNOSIS — M6281 Muscle weakness (generalized): Secondary | ICD-10-CM | POA: Diagnosis not present

## 2019-10-07 DIAGNOSIS — L89150 Pressure ulcer of sacral region, unstageable: Secondary | ICD-10-CM | POA: Diagnosis not present

## 2019-10-07 DIAGNOSIS — R5381 Other malaise: Secondary | ICD-10-CM | POA: Diagnosis not present

## 2019-10-07 DIAGNOSIS — R52 Pain, unspecified: Secondary | ICD-10-CM | POA: Diagnosis not present

## 2019-10-07 DIAGNOSIS — L89610 Pressure ulcer of right heel, unstageable: Secondary | ICD-10-CM | POA: Diagnosis not present

## 2019-10-08 DIAGNOSIS — L89150 Pressure ulcer of sacral region, unstageable: Secondary | ICD-10-CM | POA: Diagnosis not present

## 2019-10-08 DIAGNOSIS — M79601 Pain in right arm: Secondary | ICD-10-CM | POA: Diagnosis not present

## 2019-10-08 DIAGNOSIS — L89154 Pressure ulcer of sacral region, stage 4: Secondary | ICD-10-CM | POA: Diagnosis not present

## 2019-10-08 DIAGNOSIS — M6281 Muscle weakness (generalized): Secondary | ICD-10-CM | POA: Diagnosis not present

## 2019-10-08 DIAGNOSIS — R498 Other voice and resonance disorders: Secondary | ICD-10-CM | POA: Diagnosis not present

## 2019-10-08 DIAGNOSIS — R262 Difficulty in walking, not elsewhere classified: Secondary | ICD-10-CM | POA: Diagnosis not present

## 2019-10-08 DIAGNOSIS — L89120 Pressure ulcer of left upper back, unstageable: Secondary | ICD-10-CM | POA: Diagnosis not present

## 2019-10-08 DIAGNOSIS — R52 Pain, unspecified: Secondary | ICD-10-CM | POA: Diagnosis not present

## 2019-10-08 DIAGNOSIS — R5381 Other malaise: Secondary | ICD-10-CM | POA: Diagnosis not present

## 2019-10-09 DIAGNOSIS — R498 Other voice and resonance disorders: Secondary | ICD-10-CM | POA: Diagnosis not present

## 2019-10-09 DIAGNOSIS — L89120 Pressure ulcer of left upper back, unstageable: Secondary | ICD-10-CM | POA: Diagnosis not present

## 2019-10-09 DIAGNOSIS — L89154 Pressure ulcer of sacral region, stage 4: Secondary | ICD-10-CM | POA: Diagnosis not present

## 2019-10-09 DIAGNOSIS — L89150 Pressure ulcer of sacral region, unstageable: Secondary | ICD-10-CM | POA: Diagnosis not present

## 2019-10-09 DIAGNOSIS — R52 Pain, unspecified: Secondary | ICD-10-CM | POA: Diagnosis not present

## 2019-10-09 DIAGNOSIS — M79601 Pain in right arm: Secondary | ICD-10-CM | POA: Diagnosis not present

## 2019-10-09 DIAGNOSIS — R262 Difficulty in walking, not elsewhere classified: Secondary | ICD-10-CM | POA: Diagnosis not present

## 2019-10-09 DIAGNOSIS — R5381 Other malaise: Secondary | ICD-10-CM | POA: Diagnosis not present

## 2019-10-09 DIAGNOSIS — M6281 Muscle weakness (generalized): Secondary | ICD-10-CM | POA: Diagnosis not present

## 2019-10-12 DIAGNOSIS — L89150 Pressure ulcer of sacral region, unstageable: Secondary | ICD-10-CM | POA: Diagnosis not present

## 2019-10-12 DIAGNOSIS — M79601 Pain in right arm: Secondary | ICD-10-CM | POA: Diagnosis not present

## 2019-10-12 DIAGNOSIS — R5381 Other malaise: Secondary | ICD-10-CM | POA: Diagnosis not present

## 2019-10-12 DIAGNOSIS — R498 Other voice and resonance disorders: Secondary | ICD-10-CM | POA: Diagnosis not present

## 2019-10-12 DIAGNOSIS — L89120 Pressure ulcer of left upper back, unstageable: Secondary | ICD-10-CM | POA: Diagnosis not present

## 2019-10-12 DIAGNOSIS — M6281 Muscle weakness (generalized): Secondary | ICD-10-CM | POA: Diagnosis not present

## 2019-10-12 DIAGNOSIS — L89154 Pressure ulcer of sacral region, stage 4: Secondary | ICD-10-CM | POA: Diagnosis not present

## 2019-10-12 DIAGNOSIS — R52 Pain, unspecified: Secondary | ICD-10-CM | POA: Diagnosis not present

## 2019-10-12 DIAGNOSIS — R262 Difficulty in walking, not elsewhere classified: Secondary | ICD-10-CM | POA: Diagnosis not present

## 2019-10-13 DIAGNOSIS — G4700 Insomnia, unspecified: Secondary | ICD-10-CM | POA: Diagnosis not present

## 2019-10-13 DIAGNOSIS — R5381 Other malaise: Secondary | ICD-10-CM | POA: Diagnosis not present

## 2019-10-13 DIAGNOSIS — R262 Difficulty in walking, not elsewhere classified: Secondary | ICD-10-CM | POA: Diagnosis not present

## 2019-10-13 DIAGNOSIS — F064 Anxiety disorder due to known physiological condition: Secondary | ICD-10-CM | POA: Diagnosis not present

## 2019-10-13 DIAGNOSIS — L89150 Pressure ulcer of sacral region, unstageable: Secondary | ICD-10-CM | POA: Diagnosis not present

## 2019-10-13 DIAGNOSIS — F339 Major depressive disorder, recurrent, unspecified: Secondary | ICD-10-CM | POA: Diagnosis not present

## 2019-10-13 DIAGNOSIS — E1129 Type 2 diabetes mellitus with other diabetic kidney complication: Secondary | ICD-10-CM | POA: Diagnosis not present

## 2019-10-13 DIAGNOSIS — L89120 Pressure ulcer of left upper back, unstageable: Secondary | ICD-10-CM | POA: Diagnosis not present

## 2019-10-13 DIAGNOSIS — M79601 Pain in right arm: Secondary | ICD-10-CM | POA: Diagnosis not present

## 2019-10-13 DIAGNOSIS — L89154 Pressure ulcer of sacral region, stage 4: Secondary | ICD-10-CM | POA: Diagnosis not present

## 2019-10-13 DIAGNOSIS — R498 Other voice and resonance disorders: Secondary | ICD-10-CM | POA: Diagnosis not present

## 2019-10-13 DIAGNOSIS — F1194 Opioid use, unspecified with opioid-induced mood disorder: Secondary | ICD-10-CM | POA: Diagnosis not present

## 2019-10-13 DIAGNOSIS — M6281 Muscle weakness (generalized): Secondary | ICD-10-CM | POA: Diagnosis not present

## 2019-10-13 DIAGNOSIS — R52 Pain, unspecified: Secondary | ICD-10-CM | POA: Diagnosis not present

## 2019-10-13 DIAGNOSIS — F1911 Other psychoactive substance abuse, in remission: Secondary | ICD-10-CM | POA: Diagnosis not present

## 2019-10-14 DIAGNOSIS — M6281 Muscle weakness (generalized): Secondary | ICD-10-CM | POA: Diagnosis not present

## 2019-10-14 DIAGNOSIS — L89150 Pressure ulcer of sacral region, unstageable: Secondary | ICD-10-CM | POA: Diagnosis not present

## 2019-10-14 DIAGNOSIS — R52 Pain, unspecified: Secondary | ICD-10-CM | POA: Diagnosis not present

## 2019-10-14 DIAGNOSIS — L89120 Pressure ulcer of left upper back, unstageable: Secondary | ICD-10-CM | POA: Diagnosis not present

## 2019-10-14 DIAGNOSIS — L89154 Pressure ulcer of sacral region, stage 4: Secondary | ICD-10-CM | POA: Diagnosis not present

## 2019-10-14 DIAGNOSIS — R262 Difficulty in walking, not elsewhere classified: Secondary | ICD-10-CM | POA: Diagnosis not present

## 2019-10-14 DIAGNOSIS — M79601 Pain in right arm: Secondary | ICD-10-CM | POA: Diagnosis not present

## 2019-10-14 DIAGNOSIS — R5381 Other malaise: Secondary | ICD-10-CM | POA: Diagnosis not present

## 2019-10-14 DIAGNOSIS — R498 Other voice and resonance disorders: Secondary | ICD-10-CM | POA: Diagnosis not present

## 2019-10-15 DIAGNOSIS — M6281 Muscle weakness (generalized): Secondary | ICD-10-CM | POA: Diagnosis not present

## 2019-10-15 DIAGNOSIS — R262 Difficulty in walking, not elsewhere classified: Secondary | ICD-10-CM | POA: Diagnosis not present

## 2019-10-15 DIAGNOSIS — L89154 Pressure ulcer of sacral region, stage 4: Secondary | ICD-10-CM | POA: Diagnosis not present

## 2019-10-15 DIAGNOSIS — R498 Other voice and resonance disorders: Secondary | ICD-10-CM | POA: Diagnosis not present

## 2019-10-15 DIAGNOSIS — R5381 Other malaise: Secondary | ICD-10-CM | POA: Diagnosis not present

## 2019-10-15 DIAGNOSIS — L89120 Pressure ulcer of left upper back, unstageable: Secondary | ICD-10-CM | POA: Diagnosis not present

## 2019-10-15 DIAGNOSIS — M79601 Pain in right arm: Secondary | ICD-10-CM | POA: Diagnosis not present

## 2019-10-15 DIAGNOSIS — R52 Pain, unspecified: Secondary | ICD-10-CM | POA: Diagnosis not present

## 2019-10-15 DIAGNOSIS — L89150 Pressure ulcer of sacral region, unstageable: Secondary | ICD-10-CM | POA: Diagnosis not present

## 2019-10-16 DIAGNOSIS — R5381 Other malaise: Secondary | ICD-10-CM | POA: Diagnosis not present

## 2019-10-16 DIAGNOSIS — L89154 Pressure ulcer of sacral region, stage 4: Secondary | ICD-10-CM | POA: Diagnosis not present

## 2019-10-16 DIAGNOSIS — L89120 Pressure ulcer of left upper back, unstageable: Secondary | ICD-10-CM | POA: Diagnosis not present

## 2019-10-16 DIAGNOSIS — M79601 Pain in right arm: Secondary | ICD-10-CM | POA: Diagnosis not present

## 2019-10-16 DIAGNOSIS — R262 Difficulty in walking, not elsewhere classified: Secondary | ICD-10-CM | POA: Diagnosis not present

## 2019-10-16 DIAGNOSIS — R52 Pain, unspecified: Secondary | ICD-10-CM | POA: Diagnosis not present

## 2019-10-16 DIAGNOSIS — L89150 Pressure ulcer of sacral region, unstageable: Secondary | ICD-10-CM | POA: Diagnosis not present

## 2019-10-16 DIAGNOSIS — R498 Other voice and resonance disorders: Secondary | ICD-10-CM | POA: Diagnosis not present

## 2019-10-16 DIAGNOSIS — M6281 Muscle weakness (generalized): Secondary | ICD-10-CM | POA: Diagnosis not present

## 2019-10-19 DIAGNOSIS — R498 Other voice and resonance disorders: Secondary | ICD-10-CM | POA: Diagnosis not present

## 2019-10-19 DIAGNOSIS — L89154 Pressure ulcer of sacral region, stage 4: Secondary | ICD-10-CM | POA: Diagnosis not present

## 2019-10-19 DIAGNOSIS — R52 Pain, unspecified: Secondary | ICD-10-CM | POA: Diagnosis not present

## 2019-10-19 DIAGNOSIS — R5381 Other malaise: Secondary | ICD-10-CM | POA: Diagnosis not present

## 2019-10-19 DIAGNOSIS — M6281 Muscle weakness (generalized): Secondary | ICD-10-CM | POA: Diagnosis not present

## 2019-10-19 DIAGNOSIS — L89120 Pressure ulcer of left upper back, unstageable: Secondary | ICD-10-CM | POA: Diagnosis not present

## 2019-10-19 DIAGNOSIS — L89150 Pressure ulcer of sacral region, unstageable: Secondary | ICD-10-CM | POA: Diagnosis not present

## 2019-10-19 DIAGNOSIS — M79601 Pain in right arm: Secondary | ICD-10-CM | POA: Diagnosis not present

## 2019-10-19 DIAGNOSIS — R262 Difficulty in walking, not elsewhere classified: Secondary | ICD-10-CM | POA: Diagnosis not present

## 2019-10-20 DIAGNOSIS — R52 Pain, unspecified: Secondary | ICD-10-CM | POA: Diagnosis not present

## 2019-10-20 DIAGNOSIS — R498 Other voice and resonance disorders: Secondary | ICD-10-CM | POA: Diagnosis not present

## 2019-10-20 DIAGNOSIS — R262 Difficulty in walking, not elsewhere classified: Secondary | ICD-10-CM | POA: Diagnosis not present

## 2019-10-20 DIAGNOSIS — M79601 Pain in right arm: Secondary | ICD-10-CM | POA: Diagnosis not present

## 2019-10-20 DIAGNOSIS — L89154 Pressure ulcer of sacral region, stage 4: Secondary | ICD-10-CM | POA: Diagnosis not present

## 2019-10-20 DIAGNOSIS — R5381 Other malaise: Secondary | ICD-10-CM | POA: Diagnosis not present

## 2019-10-20 DIAGNOSIS — L89120 Pressure ulcer of left upper back, unstageable: Secondary | ICD-10-CM | POA: Diagnosis not present

## 2019-10-20 DIAGNOSIS — M6281 Muscle weakness (generalized): Secondary | ICD-10-CM | POA: Diagnosis not present

## 2019-10-20 DIAGNOSIS — L89150 Pressure ulcer of sacral region, unstageable: Secondary | ICD-10-CM | POA: Diagnosis not present

## 2019-10-21 DIAGNOSIS — R52 Pain, unspecified: Secondary | ICD-10-CM | POA: Diagnosis not present

## 2019-10-21 DIAGNOSIS — M79601 Pain in right arm: Secondary | ICD-10-CM | POA: Diagnosis not present

## 2019-10-21 DIAGNOSIS — M6281 Muscle weakness (generalized): Secondary | ICD-10-CM | POA: Diagnosis not present

## 2019-10-21 DIAGNOSIS — Z1159 Encounter for screening for other viral diseases: Secondary | ICD-10-CM | POA: Diagnosis not present

## 2019-10-21 DIAGNOSIS — R498 Other voice and resonance disorders: Secondary | ICD-10-CM | POA: Diagnosis not present

## 2019-10-21 DIAGNOSIS — L89154 Pressure ulcer of sacral region, stage 4: Secondary | ICD-10-CM | POA: Diagnosis not present

## 2019-10-21 DIAGNOSIS — L89120 Pressure ulcer of left upper back, unstageable: Secondary | ICD-10-CM | POA: Diagnosis not present

## 2019-10-21 DIAGNOSIS — R262 Difficulty in walking, not elsewhere classified: Secondary | ICD-10-CM | POA: Diagnosis not present

## 2019-10-21 DIAGNOSIS — L89150 Pressure ulcer of sacral region, unstageable: Secondary | ICD-10-CM | POA: Diagnosis not present

## 2019-10-21 DIAGNOSIS — R5381 Other malaise: Secondary | ICD-10-CM | POA: Diagnosis not present

## 2019-10-22 DIAGNOSIS — M79601 Pain in right arm: Secondary | ICD-10-CM | POA: Diagnosis not present

## 2019-10-22 DIAGNOSIS — R52 Pain, unspecified: Secondary | ICD-10-CM | POA: Diagnosis not present

## 2019-10-22 DIAGNOSIS — R498 Other voice and resonance disorders: Secondary | ICD-10-CM | POA: Diagnosis not present

## 2019-10-22 DIAGNOSIS — L89154 Pressure ulcer of sacral region, stage 4: Secondary | ICD-10-CM | POA: Diagnosis not present

## 2019-10-22 DIAGNOSIS — R5381 Other malaise: Secondary | ICD-10-CM | POA: Diagnosis not present

## 2019-10-22 DIAGNOSIS — L89150 Pressure ulcer of sacral region, unstageable: Secondary | ICD-10-CM | POA: Diagnosis not present

## 2019-10-22 DIAGNOSIS — M6281 Muscle weakness (generalized): Secondary | ICD-10-CM | POA: Diagnosis not present

## 2019-10-22 DIAGNOSIS — R262 Difficulty in walking, not elsewhere classified: Secondary | ICD-10-CM | POA: Diagnosis not present

## 2019-10-22 DIAGNOSIS — L89120 Pressure ulcer of left upper back, unstageable: Secondary | ICD-10-CM | POA: Diagnosis not present

## 2019-10-23 DIAGNOSIS — L89150 Pressure ulcer of sacral region, unstageable: Secondary | ICD-10-CM | POA: Diagnosis not present

## 2019-10-23 DIAGNOSIS — L89120 Pressure ulcer of left upper back, unstageable: Secondary | ICD-10-CM | POA: Diagnosis not present

## 2019-10-23 DIAGNOSIS — L89154 Pressure ulcer of sacral region, stage 4: Secondary | ICD-10-CM | POA: Diagnosis not present

## 2019-10-23 DIAGNOSIS — R5381 Other malaise: Secondary | ICD-10-CM | POA: Diagnosis not present

## 2019-10-23 DIAGNOSIS — R262 Difficulty in walking, not elsewhere classified: Secondary | ICD-10-CM | POA: Diagnosis not present

## 2019-10-23 DIAGNOSIS — R498 Other voice and resonance disorders: Secondary | ICD-10-CM | POA: Diagnosis not present

## 2019-10-23 DIAGNOSIS — R52 Pain, unspecified: Secondary | ICD-10-CM | POA: Diagnosis not present

## 2019-10-23 DIAGNOSIS — M79601 Pain in right arm: Secondary | ICD-10-CM | POA: Diagnosis not present

## 2019-10-23 DIAGNOSIS — M6281 Muscle weakness (generalized): Secondary | ICD-10-CM | POA: Diagnosis not present

## 2019-10-26 DIAGNOSIS — M6281 Muscle weakness (generalized): Secondary | ICD-10-CM | POA: Diagnosis not present

## 2019-10-26 DIAGNOSIS — L89150 Pressure ulcer of sacral region, unstageable: Secondary | ICD-10-CM | POA: Diagnosis not present

## 2019-10-26 DIAGNOSIS — M79601 Pain in right arm: Secondary | ICD-10-CM | POA: Diagnosis not present

## 2019-10-26 DIAGNOSIS — L89154 Pressure ulcer of sacral region, stage 4: Secondary | ICD-10-CM | POA: Diagnosis not present

## 2019-10-26 DIAGNOSIS — R5381 Other malaise: Secondary | ICD-10-CM | POA: Diagnosis not present

## 2019-10-26 DIAGNOSIS — L89120 Pressure ulcer of left upper back, unstageable: Secondary | ICD-10-CM | POA: Diagnosis not present

## 2019-10-26 DIAGNOSIS — R262 Difficulty in walking, not elsewhere classified: Secondary | ICD-10-CM | POA: Diagnosis not present

## 2019-10-26 DIAGNOSIS — R52 Pain, unspecified: Secondary | ICD-10-CM | POA: Diagnosis not present

## 2019-10-26 DIAGNOSIS — R498 Other voice and resonance disorders: Secondary | ICD-10-CM | POA: Diagnosis not present

## 2019-10-27 DIAGNOSIS — F1194 Opioid use, unspecified with opioid-induced mood disorder: Secondary | ICD-10-CM | POA: Diagnosis not present

## 2019-10-27 DIAGNOSIS — F339 Major depressive disorder, recurrent, unspecified: Secondary | ICD-10-CM | POA: Diagnosis not present

## 2019-10-27 DIAGNOSIS — F064 Anxiety disorder due to known physiological condition: Secondary | ICD-10-CM | POA: Diagnosis not present

## 2019-10-27 DIAGNOSIS — Z1159 Encounter for screening for other viral diseases: Secondary | ICD-10-CM | POA: Diagnosis not present

## 2019-10-29 DIAGNOSIS — R262 Difficulty in walking, not elsewhere classified: Secondary | ICD-10-CM | POA: Diagnosis not present

## 2019-10-29 DIAGNOSIS — R498 Other voice and resonance disorders: Secondary | ICD-10-CM | POA: Diagnosis not present

## 2019-10-29 DIAGNOSIS — R5381 Other malaise: Secondary | ICD-10-CM | POA: Diagnosis not present

## 2019-10-29 DIAGNOSIS — M6281 Muscle weakness (generalized): Secondary | ICD-10-CM | POA: Diagnosis not present

## 2019-10-29 DIAGNOSIS — L89150 Pressure ulcer of sacral region, unstageable: Secondary | ICD-10-CM | POA: Diagnosis not present

## 2019-10-29 DIAGNOSIS — L89120 Pressure ulcer of left upper back, unstageable: Secondary | ICD-10-CM | POA: Diagnosis not present

## 2019-10-29 DIAGNOSIS — M79601 Pain in right arm: Secondary | ICD-10-CM | POA: Diagnosis not present

## 2019-10-29 DIAGNOSIS — L89154 Pressure ulcer of sacral region, stage 4: Secondary | ICD-10-CM | POA: Diagnosis not present

## 2019-10-29 DIAGNOSIS — R52 Pain, unspecified: Secondary | ICD-10-CM | POA: Diagnosis not present

## 2019-10-30 DIAGNOSIS — R52 Pain, unspecified: Secondary | ICD-10-CM | POA: Diagnosis not present

## 2019-10-30 DIAGNOSIS — R498 Other voice and resonance disorders: Secondary | ICD-10-CM | POA: Diagnosis not present

## 2019-10-30 DIAGNOSIS — L89154 Pressure ulcer of sacral region, stage 4: Secondary | ICD-10-CM | POA: Diagnosis not present

## 2019-10-30 DIAGNOSIS — M79601 Pain in right arm: Secondary | ICD-10-CM | POA: Diagnosis not present

## 2019-10-30 DIAGNOSIS — M6281 Muscle weakness (generalized): Secondary | ICD-10-CM | POA: Diagnosis not present

## 2019-10-30 DIAGNOSIS — R5381 Other malaise: Secondary | ICD-10-CM | POA: Diagnosis not present

## 2019-10-30 DIAGNOSIS — R262 Difficulty in walking, not elsewhere classified: Secondary | ICD-10-CM | POA: Diagnosis not present

## 2019-10-30 DIAGNOSIS — L89150 Pressure ulcer of sacral region, unstageable: Secondary | ICD-10-CM | POA: Diagnosis not present

## 2019-10-30 DIAGNOSIS — L89120 Pressure ulcer of left upper back, unstageable: Secondary | ICD-10-CM | POA: Diagnosis not present

## 2019-11-02 DIAGNOSIS — R498 Other voice and resonance disorders: Secondary | ICD-10-CM | POA: Diagnosis not present

## 2019-11-02 DIAGNOSIS — L89120 Pressure ulcer of left upper back, unstageable: Secondary | ICD-10-CM | POA: Diagnosis not present

## 2019-11-02 DIAGNOSIS — R5381 Other malaise: Secondary | ICD-10-CM | POA: Diagnosis not present

## 2019-11-02 DIAGNOSIS — R52 Pain, unspecified: Secondary | ICD-10-CM | POA: Diagnosis not present

## 2019-11-02 DIAGNOSIS — R262 Difficulty in walking, not elsewhere classified: Secondary | ICD-10-CM | POA: Diagnosis not present

## 2019-11-02 DIAGNOSIS — L89150 Pressure ulcer of sacral region, unstageable: Secondary | ICD-10-CM | POA: Diagnosis not present

## 2019-11-02 DIAGNOSIS — M6281 Muscle weakness (generalized): Secondary | ICD-10-CM | POA: Diagnosis not present

## 2019-11-02 DIAGNOSIS — L89154 Pressure ulcer of sacral region, stage 4: Secondary | ICD-10-CM | POA: Diagnosis not present

## 2019-11-02 DIAGNOSIS — M79601 Pain in right arm: Secondary | ICD-10-CM | POA: Diagnosis not present

## 2019-11-03 DIAGNOSIS — R52 Pain, unspecified: Secondary | ICD-10-CM | POA: Diagnosis not present

## 2019-11-03 DIAGNOSIS — L89120 Pressure ulcer of left upper back, unstageable: Secondary | ICD-10-CM | POA: Diagnosis not present

## 2019-11-03 DIAGNOSIS — L89154 Pressure ulcer of sacral region, stage 4: Secondary | ICD-10-CM | POA: Diagnosis not present

## 2019-11-03 DIAGNOSIS — R498 Other voice and resonance disorders: Secondary | ICD-10-CM | POA: Diagnosis not present

## 2019-11-03 DIAGNOSIS — R5381 Other malaise: Secondary | ICD-10-CM | POA: Diagnosis not present

## 2019-11-03 DIAGNOSIS — L89150 Pressure ulcer of sacral region, unstageable: Secondary | ICD-10-CM | POA: Diagnosis not present

## 2019-11-03 DIAGNOSIS — Z1159 Encounter for screening for other viral diseases: Secondary | ICD-10-CM | POA: Diagnosis not present

## 2019-11-03 DIAGNOSIS — M6281 Muscle weakness (generalized): Secondary | ICD-10-CM | POA: Diagnosis not present

## 2019-11-03 DIAGNOSIS — M79601 Pain in right arm: Secondary | ICD-10-CM | POA: Diagnosis not present

## 2019-11-03 DIAGNOSIS — R262 Difficulty in walking, not elsewhere classified: Secondary | ICD-10-CM | POA: Diagnosis not present

## 2019-11-04 DIAGNOSIS — M79601 Pain in right arm: Secondary | ICD-10-CM | POA: Diagnosis not present

## 2019-11-04 DIAGNOSIS — R498 Other voice and resonance disorders: Secondary | ICD-10-CM | POA: Diagnosis not present

## 2019-11-04 DIAGNOSIS — L89120 Pressure ulcer of left upper back, unstageable: Secondary | ICD-10-CM | POA: Diagnosis not present

## 2019-11-04 DIAGNOSIS — R262 Difficulty in walking, not elsewhere classified: Secondary | ICD-10-CM | POA: Diagnosis not present

## 2019-11-04 DIAGNOSIS — L89154 Pressure ulcer of sacral region, stage 4: Secondary | ICD-10-CM | POA: Diagnosis not present

## 2019-11-04 DIAGNOSIS — L89150 Pressure ulcer of sacral region, unstageable: Secondary | ICD-10-CM | POA: Diagnosis not present

## 2019-11-04 DIAGNOSIS — M6281 Muscle weakness (generalized): Secondary | ICD-10-CM | POA: Diagnosis not present

## 2019-11-04 DIAGNOSIS — R52 Pain, unspecified: Secondary | ICD-10-CM | POA: Diagnosis not present

## 2019-11-04 DIAGNOSIS — R5381 Other malaise: Secondary | ICD-10-CM | POA: Diagnosis not present

## 2019-11-05 DIAGNOSIS — R52 Pain, unspecified: Secondary | ICD-10-CM | POA: Diagnosis not present

## 2019-11-05 DIAGNOSIS — L89154 Pressure ulcer of sacral region, stage 4: Secondary | ICD-10-CM | POA: Diagnosis not present

## 2019-11-05 DIAGNOSIS — R5381 Other malaise: Secondary | ICD-10-CM | POA: Diagnosis not present

## 2019-11-05 DIAGNOSIS — M6281 Muscle weakness (generalized): Secondary | ICD-10-CM | POA: Diagnosis not present

## 2019-11-05 DIAGNOSIS — M79601 Pain in right arm: Secondary | ICD-10-CM | POA: Diagnosis not present

## 2019-11-05 DIAGNOSIS — R498 Other voice and resonance disorders: Secondary | ICD-10-CM | POA: Diagnosis not present

## 2019-11-05 DIAGNOSIS — L89120 Pressure ulcer of left upper back, unstageable: Secondary | ICD-10-CM | POA: Diagnosis not present

## 2019-11-05 DIAGNOSIS — R262 Difficulty in walking, not elsewhere classified: Secondary | ICD-10-CM | POA: Diagnosis not present

## 2019-11-05 DIAGNOSIS — L89150 Pressure ulcer of sacral region, unstageable: Secondary | ICD-10-CM | POA: Diagnosis not present

## 2019-11-06 DIAGNOSIS — R52 Pain, unspecified: Secondary | ICD-10-CM | POA: Diagnosis not present

## 2019-11-06 DIAGNOSIS — R262 Difficulty in walking, not elsewhere classified: Secondary | ICD-10-CM | POA: Diagnosis not present

## 2019-11-06 DIAGNOSIS — R5381 Other malaise: Secondary | ICD-10-CM | POA: Diagnosis not present

## 2019-11-06 DIAGNOSIS — R498 Other voice and resonance disorders: Secondary | ICD-10-CM | POA: Diagnosis not present

## 2019-11-06 DIAGNOSIS — M79601 Pain in right arm: Secondary | ICD-10-CM | POA: Diagnosis not present

## 2019-11-06 DIAGNOSIS — M6281 Muscle weakness (generalized): Secondary | ICD-10-CM | POA: Diagnosis not present

## 2019-11-06 DIAGNOSIS — L89150 Pressure ulcer of sacral region, unstageable: Secondary | ICD-10-CM | POA: Diagnosis not present

## 2019-11-06 DIAGNOSIS — L89120 Pressure ulcer of left upper back, unstageable: Secondary | ICD-10-CM | POA: Diagnosis not present

## 2019-11-06 DIAGNOSIS — L89154 Pressure ulcer of sacral region, stage 4: Secondary | ICD-10-CM | POA: Diagnosis not present

## 2019-11-09 DIAGNOSIS — R498 Other voice and resonance disorders: Secondary | ICD-10-CM | POA: Diagnosis not present

## 2019-11-09 DIAGNOSIS — M6281 Muscle weakness (generalized): Secondary | ICD-10-CM | POA: Diagnosis not present

## 2019-11-09 DIAGNOSIS — R5381 Other malaise: Secondary | ICD-10-CM | POA: Diagnosis not present

## 2019-11-09 DIAGNOSIS — L89120 Pressure ulcer of left upper back, unstageable: Secondary | ICD-10-CM | POA: Diagnosis not present

## 2019-11-09 DIAGNOSIS — L89154 Pressure ulcer of sacral region, stage 4: Secondary | ICD-10-CM | POA: Diagnosis not present

## 2019-11-09 DIAGNOSIS — M79601 Pain in right arm: Secondary | ICD-10-CM | POA: Diagnosis not present

## 2019-11-09 DIAGNOSIS — L89150 Pressure ulcer of sacral region, unstageable: Secondary | ICD-10-CM | POA: Diagnosis not present

## 2019-11-09 DIAGNOSIS — R52 Pain, unspecified: Secondary | ICD-10-CM | POA: Diagnosis not present

## 2019-11-09 DIAGNOSIS — R262 Difficulty in walking, not elsewhere classified: Secondary | ICD-10-CM | POA: Diagnosis not present

## 2019-11-10 DIAGNOSIS — F339 Major depressive disorder, recurrent, unspecified: Secondary | ICD-10-CM | POA: Diagnosis not present

## 2019-11-10 DIAGNOSIS — M6281 Muscle weakness (generalized): Secondary | ICD-10-CM | POA: Diagnosis not present

## 2019-11-10 DIAGNOSIS — F064 Anxiety disorder due to known physiological condition: Secondary | ICD-10-CM | POA: Diagnosis not present

## 2019-11-10 DIAGNOSIS — R498 Other voice and resonance disorders: Secondary | ICD-10-CM | POA: Diagnosis not present

## 2019-11-10 DIAGNOSIS — R262 Difficulty in walking, not elsewhere classified: Secondary | ICD-10-CM | POA: Diagnosis not present

## 2019-11-10 DIAGNOSIS — L89150 Pressure ulcer of sacral region, unstageable: Secondary | ICD-10-CM | POA: Diagnosis not present

## 2019-11-10 DIAGNOSIS — F1194 Opioid use, unspecified with opioid-induced mood disorder: Secondary | ICD-10-CM | POA: Diagnosis not present

## 2019-11-10 DIAGNOSIS — L89154 Pressure ulcer of sacral region, stage 4: Secondary | ICD-10-CM | POA: Diagnosis not present

## 2019-11-10 DIAGNOSIS — R5381 Other malaise: Secondary | ICD-10-CM | POA: Diagnosis not present

## 2019-11-10 DIAGNOSIS — R52 Pain, unspecified: Secondary | ICD-10-CM | POA: Diagnosis not present

## 2019-11-10 DIAGNOSIS — L89120 Pressure ulcer of left upper back, unstageable: Secondary | ICD-10-CM | POA: Diagnosis not present

## 2019-11-10 DIAGNOSIS — M79601 Pain in right arm: Secondary | ICD-10-CM | POA: Diagnosis not present

## 2019-11-11 DIAGNOSIS — L89120 Pressure ulcer of left upper back, unstageable: Secondary | ICD-10-CM | POA: Diagnosis not present

## 2019-11-11 DIAGNOSIS — M79601 Pain in right arm: Secondary | ICD-10-CM | POA: Diagnosis not present

## 2019-11-11 DIAGNOSIS — R498 Other voice and resonance disorders: Secondary | ICD-10-CM | POA: Diagnosis not present

## 2019-11-11 DIAGNOSIS — R52 Pain, unspecified: Secondary | ICD-10-CM | POA: Diagnosis not present

## 2019-11-11 DIAGNOSIS — L89150 Pressure ulcer of sacral region, unstageable: Secondary | ICD-10-CM | POA: Diagnosis not present

## 2019-11-11 DIAGNOSIS — R262 Difficulty in walking, not elsewhere classified: Secondary | ICD-10-CM | POA: Diagnosis not present

## 2019-11-11 DIAGNOSIS — R5381 Other malaise: Secondary | ICD-10-CM | POA: Diagnosis not present

## 2019-11-11 DIAGNOSIS — M6281 Muscle weakness (generalized): Secondary | ICD-10-CM | POA: Diagnosis not present

## 2019-11-11 DIAGNOSIS — L89154 Pressure ulcer of sacral region, stage 4: Secondary | ICD-10-CM | POA: Diagnosis not present

## 2019-11-12 DIAGNOSIS — R52 Pain, unspecified: Secondary | ICD-10-CM | POA: Diagnosis not present

## 2019-11-12 DIAGNOSIS — L89150 Pressure ulcer of sacral region, unstageable: Secondary | ICD-10-CM | POA: Diagnosis not present

## 2019-11-12 DIAGNOSIS — R498 Other voice and resonance disorders: Secondary | ICD-10-CM | POA: Diagnosis not present

## 2019-11-12 DIAGNOSIS — R5381 Other malaise: Secondary | ICD-10-CM | POA: Diagnosis not present

## 2019-11-12 DIAGNOSIS — L89154 Pressure ulcer of sacral region, stage 4: Secondary | ICD-10-CM | POA: Diagnosis not present

## 2019-11-12 DIAGNOSIS — M6281 Muscle weakness (generalized): Secondary | ICD-10-CM | POA: Diagnosis not present

## 2019-11-12 DIAGNOSIS — M79601 Pain in right arm: Secondary | ICD-10-CM | POA: Diagnosis not present

## 2019-11-12 DIAGNOSIS — L89120 Pressure ulcer of left upper back, unstageable: Secondary | ICD-10-CM | POA: Diagnosis not present

## 2019-11-12 DIAGNOSIS — R262 Difficulty in walking, not elsewhere classified: Secondary | ICD-10-CM | POA: Diagnosis not present

## 2019-11-13 DIAGNOSIS — R52 Pain, unspecified: Secondary | ICD-10-CM | POA: Diagnosis not present

## 2019-11-13 DIAGNOSIS — L89154 Pressure ulcer of sacral region, stage 4: Secondary | ICD-10-CM | POA: Diagnosis not present

## 2019-11-13 DIAGNOSIS — R5381 Other malaise: Secondary | ICD-10-CM | POA: Diagnosis not present

## 2019-11-13 DIAGNOSIS — M6281 Muscle weakness (generalized): Secondary | ICD-10-CM | POA: Diagnosis not present

## 2019-11-13 DIAGNOSIS — M79601 Pain in right arm: Secondary | ICD-10-CM | POA: Diagnosis not present

## 2019-11-13 DIAGNOSIS — L89120 Pressure ulcer of left upper back, unstageable: Secondary | ICD-10-CM | POA: Diagnosis not present

## 2019-11-13 DIAGNOSIS — R498 Other voice and resonance disorders: Secondary | ICD-10-CM | POA: Diagnosis not present

## 2019-11-13 DIAGNOSIS — R262 Difficulty in walking, not elsewhere classified: Secondary | ICD-10-CM | POA: Diagnosis not present

## 2019-11-13 DIAGNOSIS — L89150 Pressure ulcer of sacral region, unstageable: Secondary | ICD-10-CM | POA: Diagnosis not present

## 2019-11-16 DIAGNOSIS — R5381 Other malaise: Secondary | ICD-10-CM | POA: Diagnosis not present

## 2019-11-16 DIAGNOSIS — L89150 Pressure ulcer of sacral region, unstageable: Secondary | ICD-10-CM | POA: Diagnosis not present

## 2019-11-16 DIAGNOSIS — R498 Other voice and resonance disorders: Secondary | ICD-10-CM | POA: Diagnosis not present

## 2019-11-16 DIAGNOSIS — R52 Pain, unspecified: Secondary | ICD-10-CM | POA: Diagnosis not present

## 2019-11-16 DIAGNOSIS — M79601 Pain in right arm: Secondary | ICD-10-CM | POA: Diagnosis not present

## 2019-11-16 DIAGNOSIS — L89120 Pressure ulcer of left upper back, unstageable: Secondary | ICD-10-CM | POA: Diagnosis not present

## 2019-11-16 DIAGNOSIS — M6281 Muscle weakness (generalized): Secondary | ICD-10-CM | POA: Diagnosis not present

## 2019-11-16 DIAGNOSIS — R262 Difficulty in walking, not elsewhere classified: Secondary | ICD-10-CM | POA: Diagnosis not present

## 2019-11-16 DIAGNOSIS — L89154 Pressure ulcer of sacral region, stage 4: Secondary | ICD-10-CM | POA: Diagnosis not present

## 2019-11-20 DIAGNOSIS — R5381 Other malaise: Secondary | ICD-10-CM | POA: Diagnosis not present

## 2019-11-20 DIAGNOSIS — R52 Pain, unspecified: Secondary | ICD-10-CM | POA: Diagnosis not present

## 2019-11-20 DIAGNOSIS — R498 Other voice and resonance disorders: Secondary | ICD-10-CM | POA: Diagnosis not present

## 2019-11-20 DIAGNOSIS — L89154 Pressure ulcer of sacral region, stage 4: Secondary | ICD-10-CM | POA: Diagnosis not present

## 2019-11-20 DIAGNOSIS — M79601 Pain in right arm: Secondary | ICD-10-CM | POA: Diagnosis not present

## 2019-11-20 DIAGNOSIS — L89150 Pressure ulcer of sacral region, unstageable: Secondary | ICD-10-CM | POA: Diagnosis not present

## 2019-11-20 DIAGNOSIS — R262 Difficulty in walking, not elsewhere classified: Secondary | ICD-10-CM | POA: Diagnosis not present

## 2019-11-20 DIAGNOSIS — M6281 Muscle weakness (generalized): Secondary | ICD-10-CM | POA: Diagnosis not present

## 2019-11-20 DIAGNOSIS — L89120 Pressure ulcer of left upper back, unstageable: Secondary | ICD-10-CM | POA: Diagnosis not present

## 2019-11-23 DIAGNOSIS — M6281 Muscle weakness (generalized): Secondary | ICD-10-CM | POA: Diagnosis not present

## 2019-11-23 DIAGNOSIS — R5381 Other malaise: Secondary | ICD-10-CM | POA: Diagnosis not present

## 2019-11-23 DIAGNOSIS — R498 Other voice and resonance disorders: Secondary | ICD-10-CM | POA: Diagnosis not present

## 2019-11-23 DIAGNOSIS — R52 Pain, unspecified: Secondary | ICD-10-CM | POA: Diagnosis not present

## 2019-11-23 DIAGNOSIS — L89120 Pressure ulcer of left upper back, unstageable: Secondary | ICD-10-CM | POA: Diagnosis not present

## 2019-11-23 DIAGNOSIS — M79601 Pain in right arm: Secondary | ICD-10-CM | POA: Diagnosis not present

## 2019-11-23 DIAGNOSIS — L89154 Pressure ulcer of sacral region, stage 4: Secondary | ICD-10-CM | POA: Diagnosis not present

## 2019-11-23 DIAGNOSIS — L89150 Pressure ulcer of sacral region, unstageable: Secondary | ICD-10-CM | POA: Diagnosis not present

## 2019-11-23 DIAGNOSIS — R262 Difficulty in walking, not elsewhere classified: Secondary | ICD-10-CM | POA: Diagnosis not present

## 2019-11-24 DIAGNOSIS — L89150 Pressure ulcer of sacral region, unstageable: Secondary | ICD-10-CM | POA: Diagnosis not present

## 2019-11-24 DIAGNOSIS — G4700 Insomnia, unspecified: Secondary | ICD-10-CM | POA: Diagnosis not present

## 2019-11-24 DIAGNOSIS — L89120 Pressure ulcer of left upper back, unstageable: Secondary | ICD-10-CM | POA: Diagnosis not present

## 2019-11-24 DIAGNOSIS — M6281 Muscle weakness (generalized): Secondary | ICD-10-CM | POA: Diagnosis not present

## 2019-11-24 DIAGNOSIS — R498 Other voice and resonance disorders: Secondary | ICD-10-CM | POA: Diagnosis not present

## 2019-11-24 DIAGNOSIS — F1194 Opioid use, unspecified with opioid-induced mood disorder: Secondary | ICD-10-CM | POA: Diagnosis not present

## 2019-11-24 DIAGNOSIS — L89154 Pressure ulcer of sacral region, stage 4: Secondary | ICD-10-CM | POA: Diagnosis not present

## 2019-11-24 DIAGNOSIS — R52 Pain, unspecified: Secondary | ICD-10-CM | POA: Diagnosis not present

## 2019-11-24 DIAGNOSIS — F339 Major depressive disorder, recurrent, unspecified: Secondary | ICD-10-CM | POA: Diagnosis not present

## 2019-11-24 DIAGNOSIS — M79601 Pain in right arm: Secondary | ICD-10-CM | POA: Diagnosis not present

## 2019-11-24 DIAGNOSIS — R262 Difficulty in walking, not elsewhere classified: Secondary | ICD-10-CM | POA: Diagnosis not present

## 2019-11-24 DIAGNOSIS — E1129 Type 2 diabetes mellitus with other diabetic kidney complication: Secondary | ICD-10-CM | POA: Diagnosis not present

## 2019-11-24 DIAGNOSIS — F064 Anxiety disorder due to known physiological condition: Secondary | ICD-10-CM | POA: Diagnosis not present

## 2019-11-24 DIAGNOSIS — R5381 Other malaise: Secondary | ICD-10-CM | POA: Diagnosis not present

## 2019-11-25 DIAGNOSIS — R498 Other voice and resonance disorders: Secondary | ICD-10-CM | POA: Diagnosis not present

## 2019-11-25 DIAGNOSIS — L89150 Pressure ulcer of sacral region, unstageable: Secondary | ICD-10-CM | POA: Diagnosis not present

## 2019-11-25 DIAGNOSIS — R262 Difficulty in walking, not elsewhere classified: Secondary | ICD-10-CM | POA: Diagnosis not present

## 2019-11-25 DIAGNOSIS — L89154 Pressure ulcer of sacral region, stage 4: Secondary | ICD-10-CM | POA: Diagnosis not present

## 2019-11-25 DIAGNOSIS — L89120 Pressure ulcer of left upper back, unstageable: Secondary | ICD-10-CM | POA: Diagnosis not present

## 2019-11-25 DIAGNOSIS — M79601 Pain in right arm: Secondary | ICD-10-CM | POA: Diagnosis not present

## 2019-11-25 DIAGNOSIS — M6281 Muscle weakness (generalized): Secondary | ICD-10-CM | POA: Diagnosis not present

## 2019-11-25 DIAGNOSIS — R5381 Other malaise: Secondary | ICD-10-CM | POA: Diagnosis not present

## 2019-11-25 DIAGNOSIS — R52 Pain, unspecified: Secondary | ICD-10-CM | POA: Diagnosis not present

## 2019-11-26 DIAGNOSIS — M6281 Muscle weakness (generalized): Secondary | ICD-10-CM | POA: Diagnosis not present

## 2019-11-26 DIAGNOSIS — L89120 Pressure ulcer of left upper back, unstageable: Secondary | ICD-10-CM | POA: Diagnosis not present

## 2019-11-26 DIAGNOSIS — R52 Pain, unspecified: Secondary | ICD-10-CM | POA: Diagnosis not present

## 2019-11-26 DIAGNOSIS — R498 Other voice and resonance disorders: Secondary | ICD-10-CM | POA: Diagnosis not present

## 2019-11-26 DIAGNOSIS — R5381 Other malaise: Secondary | ICD-10-CM | POA: Diagnosis not present

## 2019-11-26 DIAGNOSIS — R262 Difficulty in walking, not elsewhere classified: Secondary | ICD-10-CM | POA: Diagnosis not present

## 2019-11-26 DIAGNOSIS — M79601 Pain in right arm: Secondary | ICD-10-CM | POA: Diagnosis not present

## 2019-11-26 DIAGNOSIS — L89154 Pressure ulcer of sacral region, stage 4: Secondary | ICD-10-CM | POA: Diagnosis not present

## 2019-11-26 DIAGNOSIS — L89150 Pressure ulcer of sacral region, unstageable: Secondary | ICD-10-CM | POA: Diagnosis not present

## 2019-11-27 DIAGNOSIS — M6281 Muscle weakness (generalized): Secondary | ICD-10-CM | POA: Diagnosis not present

## 2019-11-27 DIAGNOSIS — L89120 Pressure ulcer of left upper back, unstageable: Secondary | ICD-10-CM | POA: Diagnosis not present

## 2019-11-27 DIAGNOSIS — R498 Other voice and resonance disorders: Secondary | ICD-10-CM | POA: Diagnosis not present

## 2019-11-27 DIAGNOSIS — L89154 Pressure ulcer of sacral region, stage 4: Secondary | ICD-10-CM | POA: Diagnosis not present

## 2019-11-27 DIAGNOSIS — M79601 Pain in right arm: Secondary | ICD-10-CM | POA: Diagnosis not present

## 2019-11-27 DIAGNOSIS — L89150 Pressure ulcer of sacral region, unstageable: Secondary | ICD-10-CM | POA: Diagnosis not present

## 2019-11-27 DIAGNOSIS — R5381 Other malaise: Secondary | ICD-10-CM | POA: Diagnosis not present

## 2019-11-27 DIAGNOSIS — R52 Pain, unspecified: Secondary | ICD-10-CM | POA: Diagnosis not present

## 2019-11-27 DIAGNOSIS — R262 Difficulty in walking, not elsewhere classified: Secondary | ICD-10-CM | POA: Diagnosis not present

## 2019-11-30 DIAGNOSIS — M6281 Muscle weakness (generalized): Secondary | ICD-10-CM | POA: Diagnosis not present

## 2019-11-30 DIAGNOSIS — R498 Other voice and resonance disorders: Secondary | ICD-10-CM | POA: Diagnosis not present

## 2019-11-30 DIAGNOSIS — R52 Pain, unspecified: Secondary | ICD-10-CM | POA: Diagnosis not present

## 2019-11-30 DIAGNOSIS — L89150 Pressure ulcer of sacral region, unstageable: Secondary | ICD-10-CM | POA: Diagnosis not present

## 2019-11-30 DIAGNOSIS — R262 Difficulty in walking, not elsewhere classified: Secondary | ICD-10-CM | POA: Diagnosis not present

## 2019-11-30 DIAGNOSIS — M79601 Pain in right arm: Secondary | ICD-10-CM | POA: Diagnosis not present

## 2019-11-30 DIAGNOSIS — R5381 Other malaise: Secondary | ICD-10-CM | POA: Diagnosis not present

## 2019-11-30 DIAGNOSIS — L89120 Pressure ulcer of left upper back, unstageable: Secondary | ICD-10-CM | POA: Diagnosis not present

## 2019-11-30 DIAGNOSIS — L89154 Pressure ulcer of sacral region, stage 4: Secondary | ICD-10-CM | POA: Diagnosis not present

## 2019-12-01 DIAGNOSIS — R262 Difficulty in walking, not elsewhere classified: Secondary | ICD-10-CM | POA: Diagnosis not present

## 2019-12-01 DIAGNOSIS — D649 Anemia, unspecified: Secondary | ICD-10-CM | POA: Diagnosis not present

## 2019-12-01 DIAGNOSIS — L89150 Pressure ulcer of sacral region, unstageable: Secondary | ICD-10-CM | POA: Diagnosis not present

## 2019-12-01 DIAGNOSIS — E785 Hyperlipidemia, unspecified: Secondary | ICD-10-CM | POA: Diagnosis not present

## 2019-12-01 DIAGNOSIS — Z8719 Personal history of other diseases of the digestive system: Secondary | ICD-10-CM | POA: Diagnosis not present

## 2019-12-01 DIAGNOSIS — R5381 Other malaise: Secondary | ICD-10-CM | POA: Diagnosis not present

## 2019-12-01 DIAGNOSIS — L89154 Pressure ulcer of sacral region, stage 4: Secondary | ICD-10-CM | POA: Diagnosis not present

## 2019-12-01 DIAGNOSIS — G8929 Other chronic pain: Secondary | ICD-10-CM | POA: Diagnosis not present

## 2019-12-01 DIAGNOSIS — M6281 Muscle weakness (generalized): Secondary | ICD-10-CM | POA: Diagnosis not present

## 2019-12-01 DIAGNOSIS — F418 Other specified anxiety disorders: Secondary | ICD-10-CM | POA: Diagnosis not present

## 2019-12-01 DIAGNOSIS — L89120 Pressure ulcer of left upper back, unstageable: Secondary | ICD-10-CM | POA: Diagnosis not present

## 2019-12-01 DIAGNOSIS — R498 Other voice and resonance disorders: Secondary | ICD-10-CM | POA: Diagnosis not present

## 2019-12-01 DIAGNOSIS — I82409 Acute embolism and thrombosis of unspecified deep veins of unspecified lower extremity: Secondary | ICD-10-CM | POA: Diagnosis not present

## 2019-12-01 DIAGNOSIS — M79601 Pain in right arm: Secondary | ICD-10-CM | POA: Diagnosis not present

## 2019-12-01 DIAGNOSIS — I10 Essential (primary) hypertension: Secondary | ICD-10-CM | POA: Diagnosis not present

## 2019-12-01 DIAGNOSIS — R52 Pain, unspecified: Secondary | ICD-10-CM | POA: Diagnosis not present

## 2019-12-03 DIAGNOSIS — R262 Difficulty in walking, not elsewhere classified: Secondary | ICD-10-CM | POA: Diagnosis not present

## 2019-12-03 DIAGNOSIS — R5381 Other malaise: Secondary | ICD-10-CM | POA: Diagnosis not present

## 2019-12-03 DIAGNOSIS — R52 Pain, unspecified: Secondary | ICD-10-CM | POA: Diagnosis not present

## 2019-12-03 DIAGNOSIS — L89154 Pressure ulcer of sacral region, stage 4: Secondary | ICD-10-CM | POA: Diagnosis not present

## 2019-12-03 DIAGNOSIS — M79601 Pain in right arm: Secondary | ICD-10-CM | POA: Diagnosis not present

## 2019-12-03 DIAGNOSIS — L89120 Pressure ulcer of left upper back, unstageable: Secondary | ICD-10-CM | POA: Diagnosis not present

## 2019-12-03 DIAGNOSIS — R498 Other voice and resonance disorders: Secondary | ICD-10-CM | POA: Diagnosis not present

## 2019-12-03 DIAGNOSIS — L89150 Pressure ulcer of sacral region, unstageable: Secondary | ICD-10-CM | POA: Diagnosis not present

## 2019-12-03 DIAGNOSIS — M6281 Muscle weakness (generalized): Secondary | ICD-10-CM | POA: Diagnosis not present

## 2019-12-04 DIAGNOSIS — R5381 Other malaise: Secondary | ICD-10-CM | POA: Diagnosis not present

## 2019-12-04 DIAGNOSIS — L89154 Pressure ulcer of sacral region, stage 4: Secondary | ICD-10-CM | POA: Diagnosis not present

## 2019-12-04 DIAGNOSIS — R52 Pain, unspecified: Secondary | ICD-10-CM | POA: Diagnosis not present

## 2019-12-04 DIAGNOSIS — L89120 Pressure ulcer of left upper back, unstageable: Secondary | ICD-10-CM | POA: Diagnosis not present

## 2019-12-04 DIAGNOSIS — M6281 Muscle weakness (generalized): Secondary | ICD-10-CM | POA: Diagnosis not present

## 2019-12-04 DIAGNOSIS — R498 Other voice and resonance disorders: Secondary | ICD-10-CM | POA: Diagnosis not present

## 2019-12-04 DIAGNOSIS — L89150 Pressure ulcer of sacral region, unstageable: Secondary | ICD-10-CM | POA: Diagnosis not present

## 2019-12-04 DIAGNOSIS — R262 Difficulty in walking, not elsewhere classified: Secondary | ICD-10-CM | POA: Diagnosis not present

## 2019-12-04 DIAGNOSIS — M79601 Pain in right arm: Secondary | ICD-10-CM | POA: Diagnosis not present

## 2019-12-08 DIAGNOSIS — F339 Major depressive disorder, recurrent, unspecified: Secondary | ICD-10-CM | POA: Diagnosis not present

## 2019-12-08 DIAGNOSIS — L89150 Pressure ulcer of sacral region, unstageable: Secondary | ICD-10-CM | POA: Diagnosis not present

## 2019-12-08 DIAGNOSIS — F1194 Opioid use, unspecified with opioid-induced mood disorder: Secondary | ICD-10-CM | POA: Diagnosis not present

## 2019-12-08 DIAGNOSIS — I739 Peripheral vascular disease, unspecified: Secondary | ICD-10-CM | POA: Diagnosis not present

## 2019-12-08 DIAGNOSIS — E039 Hypothyroidism, unspecified: Secondary | ICD-10-CM | POA: Diagnosis not present

## 2019-12-08 DIAGNOSIS — E785 Hyperlipidemia, unspecified: Secondary | ICD-10-CM | POA: Diagnosis not present

## 2019-12-08 DIAGNOSIS — F064 Anxiety disorder due to known physiological condition: Secondary | ICD-10-CM | POA: Diagnosis not present

## 2019-12-08 DIAGNOSIS — M79601 Pain in right arm: Secondary | ICD-10-CM | POA: Diagnosis not present

## 2019-12-08 DIAGNOSIS — L89120 Pressure ulcer of left upper back, unstageable: Secondary | ICD-10-CM | POA: Diagnosis not present

## 2019-12-08 DIAGNOSIS — D519 Vitamin B12 deficiency anemia, unspecified: Secondary | ICD-10-CM | POA: Diagnosis not present

## 2019-12-08 DIAGNOSIS — Z79899 Other long term (current) drug therapy: Secondary | ICD-10-CM | POA: Diagnosis not present

## 2019-12-08 DIAGNOSIS — L89610 Pressure ulcer of right heel, unstageable: Secondary | ICD-10-CM | POA: Diagnosis not present

## 2019-12-08 DIAGNOSIS — D649 Anemia, unspecified: Secondary | ICD-10-CM | POA: Diagnosis not present

## 2019-12-08 DIAGNOSIS — L89154 Pressure ulcer of sacral region, stage 4: Secondary | ICD-10-CM | POA: Diagnosis not present

## 2019-12-08 DIAGNOSIS — F0150 Vascular dementia without behavioral disturbance: Secondary | ICD-10-CM | POA: Diagnosis not present

## 2019-12-08 DIAGNOSIS — R52 Pain, unspecified: Secondary | ICD-10-CM | POA: Diagnosis not present

## 2019-12-08 DIAGNOSIS — R262 Difficulty in walking, not elsewhere classified: Secondary | ICD-10-CM | POA: Diagnosis not present

## 2019-12-08 DIAGNOSIS — M6281 Muscle weakness (generalized): Secondary | ICD-10-CM | POA: Diagnosis not present

## 2019-12-09 DIAGNOSIS — M79601 Pain in right arm: Secondary | ICD-10-CM | POA: Diagnosis not present

## 2019-12-09 DIAGNOSIS — F0150 Vascular dementia without behavioral disturbance: Secondary | ICD-10-CM | POA: Diagnosis not present

## 2019-12-09 DIAGNOSIS — R262 Difficulty in walking, not elsewhere classified: Secondary | ICD-10-CM | POA: Diagnosis not present

## 2019-12-09 DIAGNOSIS — M6281 Muscle weakness (generalized): Secondary | ICD-10-CM | POA: Diagnosis not present

## 2019-12-09 DIAGNOSIS — L89610 Pressure ulcer of right heel, unstageable: Secondary | ICD-10-CM | POA: Diagnosis not present

## 2019-12-09 DIAGNOSIS — L89150 Pressure ulcer of sacral region, unstageable: Secondary | ICD-10-CM | POA: Diagnosis not present

## 2019-12-09 DIAGNOSIS — L89120 Pressure ulcer of left upper back, unstageable: Secondary | ICD-10-CM | POA: Diagnosis not present

## 2019-12-09 DIAGNOSIS — L89154 Pressure ulcer of sacral region, stage 4: Secondary | ICD-10-CM | POA: Diagnosis not present

## 2019-12-09 DIAGNOSIS — R52 Pain, unspecified: Secondary | ICD-10-CM | POA: Diagnosis not present

## 2019-12-10 DIAGNOSIS — R52 Pain, unspecified: Secondary | ICD-10-CM | POA: Diagnosis not present

## 2019-12-10 DIAGNOSIS — M79601 Pain in right arm: Secondary | ICD-10-CM | POA: Diagnosis not present

## 2019-12-10 DIAGNOSIS — M6281 Muscle weakness (generalized): Secondary | ICD-10-CM | POA: Diagnosis not present

## 2019-12-10 DIAGNOSIS — R262 Difficulty in walking, not elsewhere classified: Secondary | ICD-10-CM | POA: Diagnosis not present

## 2019-12-10 DIAGNOSIS — L89150 Pressure ulcer of sacral region, unstageable: Secondary | ICD-10-CM | POA: Diagnosis not present

## 2019-12-10 DIAGNOSIS — L89120 Pressure ulcer of left upper back, unstageable: Secondary | ICD-10-CM | POA: Diagnosis not present

## 2019-12-10 DIAGNOSIS — L89610 Pressure ulcer of right heel, unstageable: Secondary | ICD-10-CM | POA: Diagnosis not present

## 2019-12-10 DIAGNOSIS — L89154 Pressure ulcer of sacral region, stage 4: Secondary | ICD-10-CM | POA: Diagnosis not present

## 2019-12-10 DIAGNOSIS — F0150 Vascular dementia without behavioral disturbance: Secondary | ICD-10-CM | POA: Diagnosis not present

## 2019-12-11 DIAGNOSIS — R262 Difficulty in walking, not elsewhere classified: Secondary | ICD-10-CM | POA: Diagnosis not present

## 2019-12-11 DIAGNOSIS — R52 Pain, unspecified: Secondary | ICD-10-CM | POA: Diagnosis not present

## 2019-12-11 DIAGNOSIS — L89154 Pressure ulcer of sacral region, stage 4: Secondary | ICD-10-CM | POA: Diagnosis not present

## 2019-12-11 DIAGNOSIS — L89610 Pressure ulcer of right heel, unstageable: Secondary | ICD-10-CM | POA: Diagnosis not present

## 2019-12-11 DIAGNOSIS — M6281 Muscle weakness (generalized): Secondary | ICD-10-CM | POA: Diagnosis not present

## 2019-12-11 DIAGNOSIS — L89120 Pressure ulcer of left upper back, unstageable: Secondary | ICD-10-CM | POA: Diagnosis not present

## 2019-12-11 DIAGNOSIS — F0150 Vascular dementia without behavioral disturbance: Secondary | ICD-10-CM | POA: Diagnosis not present

## 2019-12-11 DIAGNOSIS — L89150 Pressure ulcer of sacral region, unstageable: Secondary | ICD-10-CM | POA: Diagnosis not present

## 2019-12-11 DIAGNOSIS — M79601 Pain in right arm: Secondary | ICD-10-CM | POA: Diagnosis not present

## 2019-12-16 DIAGNOSIS — R52 Pain, unspecified: Secondary | ICD-10-CM | POA: Diagnosis not present

## 2019-12-16 DIAGNOSIS — M6281 Muscle weakness (generalized): Secondary | ICD-10-CM | POA: Diagnosis not present

## 2019-12-16 DIAGNOSIS — L89610 Pressure ulcer of right heel, unstageable: Secondary | ICD-10-CM | POA: Diagnosis not present

## 2019-12-16 DIAGNOSIS — L89150 Pressure ulcer of sacral region, unstageable: Secondary | ICD-10-CM | POA: Diagnosis not present

## 2019-12-16 DIAGNOSIS — F0150 Vascular dementia without behavioral disturbance: Secondary | ICD-10-CM | POA: Diagnosis not present

## 2019-12-16 DIAGNOSIS — L89120 Pressure ulcer of left upper back, unstageable: Secondary | ICD-10-CM | POA: Diagnosis not present

## 2019-12-16 DIAGNOSIS — L89154 Pressure ulcer of sacral region, stage 4: Secondary | ICD-10-CM | POA: Diagnosis not present

## 2019-12-16 DIAGNOSIS — M79601 Pain in right arm: Secondary | ICD-10-CM | POA: Diagnosis not present

## 2019-12-16 DIAGNOSIS — R262 Difficulty in walking, not elsewhere classified: Secondary | ICD-10-CM | POA: Diagnosis not present

## 2019-12-17 DIAGNOSIS — M79601 Pain in right arm: Secondary | ICD-10-CM | POA: Diagnosis not present

## 2019-12-17 DIAGNOSIS — L89154 Pressure ulcer of sacral region, stage 4: Secondary | ICD-10-CM | POA: Diagnosis not present

## 2019-12-17 DIAGNOSIS — L89610 Pressure ulcer of right heel, unstageable: Secondary | ICD-10-CM | POA: Diagnosis not present

## 2019-12-17 DIAGNOSIS — L89150 Pressure ulcer of sacral region, unstageable: Secondary | ICD-10-CM | POA: Diagnosis not present

## 2019-12-17 DIAGNOSIS — F0150 Vascular dementia without behavioral disturbance: Secondary | ICD-10-CM | POA: Diagnosis not present

## 2019-12-17 DIAGNOSIS — R262 Difficulty in walking, not elsewhere classified: Secondary | ICD-10-CM | POA: Diagnosis not present

## 2019-12-17 DIAGNOSIS — M6281 Muscle weakness (generalized): Secondary | ICD-10-CM | POA: Diagnosis not present

## 2019-12-17 DIAGNOSIS — R52 Pain, unspecified: Secondary | ICD-10-CM | POA: Diagnosis not present

## 2019-12-17 DIAGNOSIS — L89120 Pressure ulcer of left upper back, unstageable: Secondary | ICD-10-CM | POA: Diagnosis not present

## 2019-12-22 DIAGNOSIS — E1129 Type 2 diabetes mellitus with other diabetic kidney complication: Secondary | ICD-10-CM | POA: Diagnosis not present

## 2019-12-22 DIAGNOSIS — F339 Major depressive disorder, recurrent, unspecified: Secondary | ICD-10-CM | POA: Diagnosis not present

## 2019-12-22 DIAGNOSIS — F1194 Opioid use, unspecified with opioid-induced mood disorder: Secondary | ICD-10-CM | POA: Diagnosis not present

## 2019-12-22 DIAGNOSIS — F064 Anxiety disorder due to known physiological condition: Secondary | ICD-10-CM | POA: Diagnosis not present

## 2019-12-22 DIAGNOSIS — G4700 Insomnia, unspecified: Secondary | ICD-10-CM | POA: Diagnosis not present

## 2019-12-30 DIAGNOSIS — R319 Hematuria, unspecified: Secondary | ICD-10-CM | POA: Diagnosis not present

## 2019-12-30 DIAGNOSIS — N390 Urinary tract infection, site not specified: Secondary | ICD-10-CM | POA: Diagnosis not present

## 2020-01-01 DIAGNOSIS — R319 Hematuria, unspecified: Secondary | ICD-10-CM | POA: Diagnosis not present

## 2020-01-01 DIAGNOSIS — Z79899 Other long term (current) drug therapy: Secondary | ICD-10-CM | POA: Diagnosis not present

## 2020-01-01 DIAGNOSIS — N390 Urinary tract infection, site not specified: Secondary | ICD-10-CM | POA: Diagnosis not present

## 2020-01-05 DIAGNOSIS — F064 Anxiety disorder due to known physiological condition: Secondary | ICD-10-CM | POA: Diagnosis not present

## 2020-01-05 DIAGNOSIS — F1194 Opioid use, unspecified with opioid-induced mood disorder: Secondary | ICD-10-CM | POA: Diagnosis not present

## 2020-01-05 DIAGNOSIS — F339 Major depressive disorder, recurrent, unspecified: Secondary | ICD-10-CM | POA: Diagnosis not present

## 2020-01-19 DIAGNOSIS — F339 Major depressive disorder, recurrent, unspecified: Secondary | ICD-10-CM | POA: Diagnosis not present

## 2020-01-19 DIAGNOSIS — F1194 Opioid use, unspecified with opioid-induced mood disorder: Secondary | ICD-10-CM | POA: Diagnosis not present

## 2020-01-19 DIAGNOSIS — G4700 Insomnia, unspecified: Secondary | ICD-10-CM | POA: Diagnosis not present

## 2020-01-19 DIAGNOSIS — F064 Anxiety disorder due to known physiological condition: Secondary | ICD-10-CM | POA: Diagnosis not present

## 2020-01-19 DIAGNOSIS — E1129 Type 2 diabetes mellitus with other diabetic kidney complication: Secondary | ICD-10-CM | POA: Diagnosis not present

## 2020-01-20 DIAGNOSIS — Z79899 Other long term (current) drug therapy: Secondary | ICD-10-CM | POA: Diagnosis not present

## 2020-01-20 DIAGNOSIS — R319 Hematuria, unspecified: Secondary | ICD-10-CM | POA: Diagnosis not present

## 2020-01-20 DIAGNOSIS — N390 Urinary tract infection, site not specified: Secondary | ICD-10-CM | POA: Diagnosis not present

## 2020-02-02 DIAGNOSIS — F1194 Opioid use, unspecified with opioid-induced mood disorder: Secondary | ICD-10-CM | POA: Diagnosis not present

## 2020-02-02 DIAGNOSIS — I10 Essential (primary) hypertension: Secondary | ICD-10-CM | POA: Diagnosis not present

## 2020-02-02 DIAGNOSIS — F064 Anxiety disorder due to known physiological condition: Secondary | ICD-10-CM | POA: Diagnosis not present

## 2020-02-02 DIAGNOSIS — F339 Major depressive disorder, recurrent, unspecified: Secondary | ICD-10-CM | POA: Diagnosis not present

## 2020-02-02 DIAGNOSIS — I739 Peripheral vascular disease, unspecified: Secondary | ICD-10-CM | POA: Diagnosis not present

## 2020-02-02 DIAGNOSIS — F418 Other specified anxiety disorders: Secondary | ICD-10-CM | POA: Diagnosis not present

## 2020-02-02 DIAGNOSIS — G8929 Other chronic pain: Secondary | ICD-10-CM | POA: Diagnosis not present

## 2020-02-02 DIAGNOSIS — L89154 Pressure ulcer of sacral region, stage 4: Secondary | ICD-10-CM | POA: Diagnosis not present

## 2020-02-02 DIAGNOSIS — I82409 Acute embolism and thrombosis of unspecified deep veins of unspecified lower extremity: Secondary | ICD-10-CM | POA: Diagnosis not present

## 2020-02-02 DIAGNOSIS — Z8719 Personal history of other diseases of the digestive system: Secondary | ICD-10-CM | POA: Diagnosis not present

## 2020-02-02 DIAGNOSIS — D649 Anemia, unspecified: Secondary | ICD-10-CM | POA: Diagnosis not present

## 2020-02-02 DIAGNOSIS — E785 Hyperlipidemia, unspecified: Secondary | ICD-10-CM | POA: Diagnosis not present

## 2020-02-16 DIAGNOSIS — F339 Major depressive disorder, recurrent, unspecified: Secondary | ICD-10-CM | POA: Diagnosis not present

## 2020-02-16 DIAGNOSIS — F1194 Opioid use, unspecified with opioid-induced mood disorder: Secondary | ICD-10-CM | POA: Diagnosis not present

## 2020-02-16 DIAGNOSIS — F064 Anxiety disorder due to known physiological condition: Secondary | ICD-10-CM | POA: Diagnosis not present

## 2020-02-23 DIAGNOSIS — F064 Anxiety disorder due to known physiological condition: Secondary | ICD-10-CM | POA: Diagnosis not present

## 2020-02-23 DIAGNOSIS — F1194 Opioid use, unspecified with opioid-induced mood disorder: Secondary | ICD-10-CM | POA: Diagnosis not present

## 2020-02-23 DIAGNOSIS — E1129 Type 2 diabetes mellitus with other diabetic kidney complication: Secondary | ICD-10-CM | POA: Diagnosis not present

## 2020-02-23 DIAGNOSIS — F331 Major depressive disorder, recurrent, moderate: Secondary | ICD-10-CM | POA: Diagnosis not present

## 2020-02-23 DIAGNOSIS — G4701 Insomnia due to medical condition: Secondary | ICD-10-CM | POA: Diagnosis not present

## 2020-03-01 DIAGNOSIS — F339 Major depressive disorder, recurrent, unspecified: Secondary | ICD-10-CM | POA: Diagnosis not present

## 2020-03-01 DIAGNOSIS — F1194 Opioid use, unspecified with opioid-induced mood disorder: Secondary | ICD-10-CM | POA: Diagnosis not present

## 2020-03-01 DIAGNOSIS — F064 Anxiety disorder due to known physiological condition: Secondary | ICD-10-CM | POA: Diagnosis not present

## 2020-03-11 DIAGNOSIS — R569 Unspecified convulsions: Secondary | ICD-10-CM | POA: Diagnosis not present

## 2020-03-11 DIAGNOSIS — D649 Anemia, unspecified: Secondary | ICD-10-CM | POA: Diagnosis not present

## 2020-03-11 DIAGNOSIS — I110 Hypertensive heart disease with heart failure: Secondary | ICD-10-CM | POA: Diagnosis not present

## 2020-03-11 DIAGNOSIS — R748 Abnormal levels of other serum enzymes: Secondary | ICD-10-CM | POA: Diagnosis not present

## 2020-03-22 ENCOUNTER — Other Ambulatory Visit (INDEPENDENT_AMBULATORY_CARE_PROVIDER_SITE_OTHER): Payer: Self-pay | Admitting: Nurse Practitioner

## 2020-03-22 DIAGNOSIS — I739 Peripheral vascular disease, unspecified: Secondary | ICD-10-CM

## 2020-03-24 ENCOUNTER — Ambulatory Visit (INDEPENDENT_AMBULATORY_CARE_PROVIDER_SITE_OTHER): Payer: Medicare HMO

## 2020-03-24 ENCOUNTER — Encounter (INDEPENDENT_AMBULATORY_CARE_PROVIDER_SITE_OTHER): Payer: Self-pay | Admitting: Nurse Practitioner

## 2020-03-24 ENCOUNTER — Other Ambulatory Visit: Payer: Self-pay

## 2020-03-24 ENCOUNTER — Ambulatory Visit (INDEPENDENT_AMBULATORY_CARE_PROVIDER_SITE_OTHER): Payer: Medicare HMO | Admitting: Nurse Practitioner

## 2020-03-24 VITALS — BP 120/73 | HR 63 | Resp 17 | Ht 66.0 in | Wt 160.0 lb

## 2020-03-24 DIAGNOSIS — M159 Polyosteoarthritis, unspecified: Secondary | ICD-10-CM

## 2020-03-24 DIAGNOSIS — M8949 Other hypertrophic osteoarthropathy, multiple sites: Secondary | ICD-10-CM

## 2020-03-24 DIAGNOSIS — I739 Peripheral vascular disease, unspecified: Secondary | ICD-10-CM | POA: Diagnosis not present

## 2020-03-24 DIAGNOSIS — I70213 Atherosclerosis of native arteries of extremities with intermittent claudication, bilateral legs: Secondary | ICD-10-CM

## 2020-03-24 DIAGNOSIS — I10 Essential (primary) hypertension: Secondary | ICD-10-CM | POA: Diagnosis not present

## 2020-03-29 ENCOUNTER — Encounter (INDEPENDENT_AMBULATORY_CARE_PROVIDER_SITE_OTHER): Payer: Self-pay | Admitting: Nurse Practitioner

## 2020-03-29 DIAGNOSIS — F339 Major depressive disorder, recurrent, unspecified: Secondary | ICD-10-CM | POA: Diagnosis not present

## 2020-03-29 DIAGNOSIS — F1194 Opioid use, unspecified with opioid-induced mood disorder: Secondary | ICD-10-CM | POA: Diagnosis not present

## 2020-03-29 DIAGNOSIS — F064 Anxiety disorder due to known physiological condition: Secondary | ICD-10-CM | POA: Diagnosis not present

## 2020-03-29 NOTE — Progress Notes (Signed)
Subjective:    Patient ID: Yesenia Shepard, female    DOB: [birth year], 69 y.o.   MRN: [id number] Chief Complaint  Patient presents with  . Follow-up    The patient returns to the office for followup and review of the noninvasive studies. There have been no interval changes in lower extremity symptoms. No interval shortening of the patient's claudication distance or development of rest pain symptoms. No new ulcers or wounds have occurred since the last visit.  There have been no significant changes to the patient's overall health care.  The patient denies amaurosis fugax or recent TIA symptoms. There are no recent neurological changes noted. The patient denies history of DVT, PE or superficial thrombophlebitis. The patient denies recent episodes of angina or shortness of breath.   ABI Rt=1.07 and Lt=0.89  (previous ABI's Rt=1.22 and Lt=0.77) Duplex ultrasound of the right lower extremity reveals triphasic tibial artery waveforms with biphasic tibial artery waveforms in the left lower extremity.  The left great toe waveforms are slightly dampened.   Review of Systems  Neurological: Positive for weakness.  All other systems reviewed and are negative.      Objective:   Physical Exam Cardiovascular:     Rate and Rhythm: Normal rate and regular rhythm.     Pulses: Decreased pulses.  Pulmonary:     Effort: Pulmonary effort is normal.  Neurological:     Mental Status: She is oriented to person, place, and time.     Motor: Weakness present.  Psychiatric:        Mood and Affect: Mood normal.        Behavior: Behavior normal.        Thought Content: Thought content normal.        Judgment: Judgment normal.     BP 120/73 (BP Location: Left Arm)   Pulse 63   Resp 17   Ht 5\' 6"  (1.676 m)   Wt 160 lb (72.6 kg)   BMI 25.82 kg/m   Past Medical History:  Diagnosis Date  . Anxiety   . Arthritis   . Chronic pain   . Depression   . Diabetes mellitus without complication ([place])   .  Drug abuse ([place])    History of polysubstance abuse; currently on methadone.  . Hepatitis    History of Hep "C". treated and cured with Harvoni  . History of chicken pox   . Hypertension   . Panic attacks   . Peripheral vascular disease ([place])    stent in place.   Marland Kitchen UTI (urinary tract infection)    History of    Social History   Socioeconomic History  . Marital status: Divorced    Spouse name: Not on file  . Number of children: Not on file  . Years of education: Not on file  . Highest education level: Not on file  Occupational History  . Not on file  Tobacco Use  . Smoking status: Former Smoker    Packs/day: 0.50    Years: 30.00    Pack years: 15.00    Types: Cigarettes    Quit date: 07/25/2018    Years since quitting: 1.6  . Smokeless tobacco: Never Used  Vaping Use  . Vaping Use: Never used  Substance and Sexual Activity  . Alcohol use: No    Alcohol/week: 0.0 standard drinks  . Drug use: Not Currently    Comment: Hx of.  . Sexual activity: Not Currently  Other Topics Concern  . Not  on file  Social History Narrative   1 daughter died in 10/01/2016    Lives at home not alone    Social Determinants of Health   Financial Resource Strain:   . Difficulty of Paying Living Expenses: Not on file  Food Insecurity:   . Worried About Charity fundraiser in the Last Year: Not on file  . Ran Out of Food in the Last Year: Not on file  Transportation Needs:   . Lack of Transportation (Medical): Not on file  . Lack of Transportation (Non-Medical): Not on file  Physical Activity:   . Days of Exercise per Week: Not on file  . Minutes of Exercise per Session: Not on file  Stress:   . Feeling of Stress : Not on file  Social Connections:   . Frequency of Communication with Friends and Family: Not on file  . Frequency of Social Gatherings with Friends and Family: Not on file  . Attends Religious Services: Not on file  . Active Member of Clubs or Organizations: Not on file  .  Attends Archivist Meetings: Not on file  . Marital Status: Not on file  Intimate Partner Violence:   . Fear of Current or Ex-Partner: Not on file  . Emotionally Abused: Not on file  . Physically Abused: Not on file  . Sexually Abused: Not on file    Past Surgical History:  Procedure Laterality Date  . ABDOMINAL HYSTERECTOMY  1989  . AMPUTATION TOE Left 10/26/2018   Procedure: 5TH RAY RESCTION;  Surgeon: Albertine Patricia, DPM;  Location: [facility];  Service: Podiatry;  Laterality: Left;  . APPLICATION OF WOUND VAC N/A 02/05/2019   Procedure: APPLICATION OF WOUND VAC POSS DEBRIDEMENT;  Surgeon: Fredirick Maudlin, MD;  Location: [facility];  Service: General;  Laterality: N/A;  . APPLICATION OF WOUND VAC  02/03/2019   Procedure: APPLICATION OF WOUND VAC;  Surgeon: Fredirick Maudlin, MD;  Location: [facility];  Service: General;;  . CHOLECYSTECTOMY  1987  . INTRACAPSULAR CATARACT EXTRACTION Left   . IRRIGATION AND DEBRIDEMENT ABSCESS N/A 02/03/2019   Procedure: IRRIGATION AND DEBRIDEMENT SACRAL DECUBITUS;  Surgeon: Fredirick Maudlin, MD;  Location: [facility];  Service: General;  Laterality: N/A;  . LOWER EXTREMITY ANGIOGRAPHY Left 12/17/2017   Procedure: LOWER EXTREMITY ANGIOGRAPHY;  Surgeon: Katha Cabal, MD;  Location: [place] [facility];  Service: Cardiovascular;  Laterality: Left;  . LOWER EXTREMITY ANGIOGRAPHY Left 10/24/2018   Procedure: LOWER EXTREMITY ANGIOGRAPHY;  Surgeon: Algernon Huxley, MD;  Location: [place] [facility];  Service: Cardiovascular;  Laterality: Left;  . PERIPHERAL VASCULAR CATHETERIZATION Left 05/04/2015   Procedure: Lower Extremity Angiography;  Surgeon: Katha Cabal, MD;  Location: [place] [facility];  Service: Cardiovascular;  Laterality: Left;  . PERIPHERAL VASCULAR CATHETERIZATION Left 05/04/2015   Procedure: Lower Extremity Intervention;  Surgeon: Katha Cabal, MD;  Location: [place] [facility];  Service: Cardiovascular;  Laterality: Left;   . SHOULDER ARTHROSCOPY WITH OPEN ROTATOR CUFF REPAIR Right 01/16/2017   Procedure: SHOULDER ARTHROSCOPY WITH OPEN ROTATOR CUFF REPAIR;  Surgeon: Thornton Park, MD;  Location: [facility];  Service: Orthopedics;  Laterality: Right;  . TONSILLECTOMY AND ADENOIDECTOMY  1971    Family History  Problem Relation Age of Onset  . Arthritis Mother   . Mental illness Mother        depression  . Diabetes Mother   . Cancer Mother        pancreatic  . Cancer Father  colon  . Heart disease Father   . Diabetes Father   . Cancer Daughter        lung    No Known Allergies     Assessment & Plan:   1. Atherosclerosis of native artery of both lower extremities with intermittent claudication (HCC)  Recommend:  The patient has evidence of atherosclerosis of the lower extremities with claudication.  The patient does not voice lifestyle limiting changes at this point in time.  Noninvasive studies do not suggest clinically significant change.  No invasive studies, angiography or surgery at this time The patient should continue walking and begin a more formal exercise program.  The patient should continue antiplatelet therapy and aggressive treatment of the lipid abnormalities  No changes in the patient's medications at this time  The patient should continue wearing graduated compression socks 10-15 mmHg strength to control the mild edema.    2. Essential hypertension Continue antihypertensive medications as already ordered, these medications have been reviewed and there are no changes at this time.   3. Primary osteoarthritis involving multiple joints Continue NSAID medications as already ordered, these medications have been reviewed and there are no changes at this time.  Continued activity and therapy was stressed.    Current Outpatient Medications on File Prior to Visit  Medication Sig Dispense Refill  . acetaminophen (TYLENOL) 325 MG tablet Take 2 tablets (650 mg total) by  mouth every 6 (six) hours as needed for mild pain (or Fever >/= 101).    Marland Kitchen atorvastatin (LIPITOR) 40 MG tablet Take 1 tablet (40 mg total) by mouth daily. 90 tablet 3  . bisacodyl (DULCOLAX) 5 MG EC tablet Take 1 tablet (5 mg total) by mouth daily as needed for moderate constipation. 30 tablet 0  . cholecalciferol (VITAMIN D3) 25 MCG (1000 UT) tablet Take 1 tablet (1,000 Units total) by mouth daily.    . clonazepam (KLONOPIN) 0.125 MG disintegrating tablet Take 0.125 mg by mouth 2 (two) times daily.    . clopidogrel (PLAVIX) 75 MG tablet TAKE 1 TABLET BY MOUTH ONCE DAILY 30 tablet 11  . divalproex (DEPAKOTE SPRINKLE) 125 MG capsule     . docusate sodium (COLACE) 100 MG capsule Take 1 capsule (100 mg total) by mouth 2 (two) times daily. 30 capsule 0  . ELIQUIS 5 MG TABS tablet     . enoxaparin (LOVENOX) 60 MG/0.6ML injection     . escitalopram (LEXAPRO) 5 MG tablet Take 1 tablet (5 mg total) by mouth daily.    . famotidine (PEPCID) 20 MG tablet Take 1 tablet (20 mg total) by mouth daily. 30 tablet 0  . feeding supplement, GLUCERNA SHAKE, (GLUCERNA SHAKE) LIQD Take 237 mLs by mouth 2 (two) times daily between meals.  0  . ferrous sulfate 325 (65 FE) MG tablet Take 325 mg by mouth 2 (two) times daily.     Marland Kitchen HYDROcodone-acetaminophen (NORCO/VICODIN) 5-325 MG tablet Take 1 tablet by mouth every 6 (six) hours as needed for moderate pain. 14 tablet 0  . KLOR-CON M10 10 MEQ tablet     . lisinopril (PRINIVIL,ZESTRIL) 10 MG tablet Take 1 tablet (10 mg total) by mouth daily. 90 tablet 3  . lisinopril (ZESTRIL) 20 MG tablet     . Melatonin 5 MG TABS Take 5 mg by mouth at bedtime.    . methadone (DOLOPHINE) 10 MG/5ML solution Take 5 mg by mouth every 6 (six) hours as needed for pain. Give 65 ml oral    .  Multiple Vitamins-Iron (MULTIVITAMINS WITH IRON) TABS tablet Take 1 tablet by mouth daily.  0  . Polyethylene Glycol 3350 (MIRALAX PO) Take by mouth.    . potassium chloride (K-DUR) 10 MEQ tablet Take 10  mEq by mouth daily.    Marland Kitchen sulfamethoxazole-trimethoprim (BACTRIM DS) 800-160 MG tablet     . traZODone (DESYREL) 150 MG tablet     . traZODone (DESYREL) 50 MG tablet Take 50 mg by mouth at bedtime.    . vitamin C (VITAMIN C) 250 MG tablet Take 1 tablet (250 mg total) by mouth 2 (two) times daily.     No current facility-administered medications on file prior to visit.    There are no Patient Instructions on file for this visit. No follow-ups on file.   Kris Hartmann, NP

## 2020-04-05 DIAGNOSIS — I82409 Acute embolism and thrombosis of unspecified deep veins of unspecified lower extremity: Secondary | ICD-10-CM | POA: Diagnosis not present

## 2020-04-05 DIAGNOSIS — I739 Peripheral vascular disease, unspecified: Secondary | ICD-10-CM | POA: Diagnosis not present

## 2020-04-05 DIAGNOSIS — E785 Hyperlipidemia, unspecified: Secondary | ICD-10-CM | POA: Diagnosis not present

## 2020-04-05 DIAGNOSIS — I10 Essential (primary) hypertension: Secondary | ICD-10-CM | POA: Diagnosis not present

## 2020-04-05 DIAGNOSIS — G8929 Other chronic pain: Secondary | ICD-10-CM | POA: Diagnosis not present

## 2020-04-05 DIAGNOSIS — D649 Anemia, unspecified: Secondary | ICD-10-CM | POA: Diagnosis not present

## 2020-04-05 DIAGNOSIS — F418 Other specified anxiety disorders: Secondary | ICD-10-CM | POA: Diagnosis not present

## 2020-04-05 DIAGNOSIS — L89154 Pressure ulcer of sacral region, stage 4: Secondary | ICD-10-CM | POA: Diagnosis not present

## 2020-04-12 DIAGNOSIS — F339 Major depressive disorder, recurrent, unspecified: Secondary | ICD-10-CM | POA: Diagnosis not present

## 2020-04-12 DIAGNOSIS — L89620 Pressure ulcer of left heel, unstageable: Secondary | ICD-10-CM | POA: Diagnosis not present

## 2020-04-12 DIAGNOSIS — Z23 Encounter for immunization: Secondary | ICD-10-CM | POA: Diagnosis not present

## 2020-04-12 DIAGNOSIS — F1194 Opioid use, unspecified with opioid-induced mood disorder: Secondary | ICD-10-CM | POA: Diagnosis not present

## 2020-04-12 DIAGNOSIS — L97412 Non-pressure chronic ulcer of right heel and midfoot with fat layer exposed: Secondary | ICD-10-CM | POA: Diagnosis not present

## 2020-04-12 DIAGNOSIS — F064 Anxiety disorder due to known physiological condition: Secondary | ICD-10-CM | POA: Diagnosis not present

## 2020-04-12 DIAGNOSIS — L89120 Pressure ulcer of left upper back, unstageable: Secondary | ICD-10-CM | POA: Diagnosis not present

## 2020-04-12 DIAGNOSIS — L89150 Pressure ulcer of sacral region, unstageable: Secondary | ICD-10-CM | POA: Diagnosis not present

## 2020-04-12 DIAGNOSIS — I10 Essential (primary) hypertension: Secondary | ICD-10-CM | POA: Diagnosis not present

## 2020-04-12 DIAGNOSIS — L97919 Non-pressure chronic ulcer of unspecified part of right lower leg with unspecified severity: Secondary | ICD-10-CM | POA: Diagnosis not present

## 2020-04-12 DIAGNOSIS — L97229 Non-pressure chronic ulcer of left calf with unspecified severity: Secondary | ICD-10-CM | POA: Diagnosis not present

## 2020-04-12 DIAGNOSIS — L89610 Pressure ulcer of right heel, unstageable: Secondary | ICD-10-CM | POA: Diagnosis not present

## 2020-04-12 DIAGNOSIS — L89154 Pressure ulcer of sacral region, stage 4: Secondary | ICD-10-CM | POA: Diagnosis not present

## 2020-04-12 DIAGNOSIS — L97329 Non-pressure chronic ulcer of left ankle with unspecified severity: Secondary | ICD-10-CM | POA: Diagnosis not present

## 2020-04-18 DIAGNOSIS — N390 Urinary tract infection, site not specified: Secondary | ICD-10-CM | POA: Diagnosis not present

## 2020-04-24 DIAGNOSIS — R1312 Dysphagia, oropharyngeal phase: Secondary | ICD-10-CM | POA: Diagnosis not present

## 2020-04-24 DIAGNOSIS — R488 Other symbolic dysfunctions: Secondary | ICD-10-CM | POA: Diagnosis not present

## 2020-04-24 DIAGNOSIS — R498 Other voice and resonance disorders: Secondary | ICD-10-CM | POA: Diagnosis not present

## 2020-04-24 DIAGNOSIS — L89120 Pressure ulcer of left upper back, unstageable: Secondary | ICD-10-CM | POA: Diagnosis not present

## 2020-04-24 DIAGNOSIS — N390 Urinary tract infection, site not specified: Secondary | ICD-10-CM | POA: Diagnosis not present

## 2020-04-24 DIAGNOSIS — L89154 Pressure ulcer of sacral region, stage 4: Secondary | ICD-10-CM | POA: Diagnosis not present

## 2020-04-24 DIAGNOSIS — L89150 Pressure ulcer of sacral region, unstageable: Secondary | ICD-10-CM | POA: Diagnosis not present

## 2020-04-24 DIAGNOSIS — R262 Difficulty in walking, not elsewhere classified: Secondary | ICD-10-CM | POA: Diagnosis not present

## 2020-04-24 DIAGNOSIS — R2681 Unsteadiness on feet: Secondary | ICD-10-CM | POA: Diagnosis not present

## 2020-04-26 DIAGNOSIS — R488 Other symbolic dysfunctions: Secondary | ICD-10-CM | POA: Diagnosis not present

## 2020-04-26 DIAGNOSIS — R2681 Unsteadiness on feet: Secondary | ICD-10-CM | POA: Diagnosis not present

## 2020-04-26 DIAGNOSIS — L89120 Pressure ulcer of left upper back, unstageable: Secondary | ICD-10-CM | POA: Diagnosis not present

## 2020-04-26 DIAGNOSIS — R1312 Dysphagia, oropharyngeal phase: Secondary | ICD-10-CM | POA: Diagnosis not present

## 2020-04-26 DIAGNOSIS — L89154 Pressure ulcer of sacral region, stage 4: Secondary | ICD-10-CM | POA: Diagnosis not present

## 2020-04-26 DIAGNOSIS — N390 Urinary tract infection, site not specified: Secondary | ICD-10-CM | POA: Diagnosis not present

## 2020-04-26 DIAGNOSIS — L89150 Pressure ulcer of sacral region, unstageable: Secondary | ICD-10-CM | POA: Diagnosis not present

## 2020-04-26 DIAGNOSIS — R498 Other voice and resonance disorders: Secondary | ICD-10-CM | POA: Diagnosis not present

## 2020-04-26 DIAGNOSIS — R262 Difficulty in walking, not elsewhere classified: Secondary | ICD-10-CM | POA: Diagnosis not present

## 2020-04-27 DIAGNOSIS — L89150 Pressure ulcer of sacral region, unstageable: Secondary | ICD-10-CM | POA: Diagnosis not present

## 2020-04-27 DIAGNOSIS — R1312 Dysphagia, oropharyngeal phase: Secondary | ICD-10-CM | POA: Diagnosis not present

## 2020-04-27 DIAGNOSIS — R488 Other symbolic dysfunctions: Secondary | ICD-10-CM | POA: Diagnosis not present

## 2020-04-27 DIAGNOSIS — R2681 Unsteadiness on feet: Secondary | ICD-10-CM | POA: Diagnosis not present

## 2020-04-27 DIAGNOSIS — R498 Other voice and resonance disorders: Secondary | ICD-10-CM | POA: Diagnosis not present

## 2020-04-27 DIAGNOSIS — L89120 Pressure ulcer of left upper back, unstageable: Secondary | ICD-10-CM | POA: Diagnosis not present

## 2020-04-27 DIAGNOSIS — L89154 Pressure ulcer of sacral region, stage 4: Secondary | ICD-10-CM | POA: Diagnosis not present

## 2020-04-27 DIAGNOSIS — R262 Difficulty in walking, not elsewhere classified: Secondary | ICD-10-CM | POA: Diagnosis not present

## 2020-04-27 DIAGNOSIS — N390 Urinary tract infection, site not specified: Secondary | ICD-10-CM | POA: Diagnosis not present

## 2020-04-28 DIAGNOSIS — L89150 Pressure ulcer of sacral region, unstageable: Secondary | ICD-10-CM | POA: Diagnosis not present

## 2020-04-28 DIAGNOSIS — R2681 Unsteadiness on feet: Secondary | ICD-10-CM | POA: Diagnosis not present

## 2020-04-28 DIAGNOSIS — R1312 Dysphagia, oropharyngeal phase: Secondary | ICD-10-CM | POA: Diagnosis not present

## 2020-04-28 DIAGNOSIS — L89120 Pressure ulcer of left upper back, unstageable: Secondary | ICD-10-CM | POA: Diagnosis not present

## 2020-04-28 DIAGNOSIS — N390 Urinary tract infection, site not specified: Secondary | ICD-10-CM | POA: Diagnosis not present

## 2020-04-28 DIAGNOSIS — L89154 Pressure ulcer of sacral region, stage 4: Secondary | ICD-10-CM | POA: Diagnosis not present

## 2020-04-28 DIAGNOSIS — R262 Difficulty in walking, not elsewhere classified: Secondary | ICD-10-CM | POA: Diagnosis not present

## 2020-04-28 DIAGNOSIS — R498 Other voice and resonance disorders: Secondary | ICD-10-CM | POA: Diagnosis not present

## 2020-04-28 DIAGNOSIS — R488 Other symbolic dysfunctions: Secondary | ICD-10-CM | POA: Diagnosis not present

## 2020-04-29 DIAGNOSIS — R262 Difficulty in walking, not elsewhere classified: Secondary | ICD-10-CM | POA: Diagnosis not present

## 2020-04-29 DIAGNOSIS — L89120 Pressure ulcer of left upper back, unstageable: Secondary | ICD-10-CM | POA: Diagnosis not present

## 2020-04-29 DIAGNOSIS — N390 Urinary tract infection, site not specified: Secondary | ICD-10-CM | POA: Diagnosis not present

## 2020-04-29 DIAGNOSIS — R488 Other symbolic dysfunctions: Secondary | ICD-10-CM | POA: Diagnosis not present

## 2020-04-29 DIAGNOSIS — L89150 Pressure ulcer of sacral region, unstageable: Secondary | ICD-10-CM | POA: Diagnosis not present

## 2020-04-29 DIAGNOSIS — R2681 Unsteadiness on feet: Secondary | ICD-10-CM | POA: Diagnosis not present

## 2020-04-29 DIAGNOSIS — L89154 Pressure ulcer of sacral region, stage 4: Secondary | ICD-10-CM | POA: Diagnosis not present

## 2020-04-29 DIAGNOSIS — R1312 Dysphagia, oropharyngeal phase: Secondary | ICD-10-CM | POA: Diagnosis not present

## 2020-04-29 DIAGNOSIS — R498 Other voice and resonance disorders: Secondary | ICD-10-CM | POA: Diagnosis not present

## 2020-05-02 DIAGNOSIS — R498 Other voice and resonance disorders: Secondary | ICD-10-CM | POA: Diagnosis not present

## 2020-05-02 DIAGNOSIS — R262 Difficulty in walking, not elsewhere classified: Secondary | ICD-10-CM | POA: Diagnosis not present

## 2020-05-02 DIAGNOSIS — L89150 Pressure ulcer of sacral region, unstageable: Secondary | ICD-10-CM | POA: Diagnosis not present

## 2020-05-02 DIAGNOSIS — R1312 Dysphagia, oropharyngeal phase: Secondary | ICD-10-CM | POA: Diagnosis not present

## 2020-05-02 DIAGNOSIS — L89120 Pressure ulcer of left upper back, unstageable: Secondary | ICD-10-CM | POA: Diagnosis not present

## 2020-05-02 DIAGNOSIS — N390 Urinary tract infection, site not specified: Secondary | ICD-10-CM | POA: Diagnosis not present

## 2020-05-02 DIAGNOSIS — R2681 Unsteadiness on feet: Secondary | ICD-10-CM | POA: Diagnosis not present

## 2020-05-02 DIAGNOSIS — R488 Other symbolic dysfunctions: Secondary | ICD-10-CM | POA: Diagnosis not present

## 2020-05-02 DIAGNOSIS — L89154 Pressure ulcer of sacral region, stage 4: Secondary | ICD-10-CM | POA: Diagnosis not present

## 2020-05-03 DIAGNOSIS — F331 Major depressive disorder, recurrent, moderate: Secondary | ICD-10-CM | POA: Diagnosis not present

## 2020-05-03 DIAGNOSIS — F0150 Vascular dementia without behavioral disturbance: Secondary | ICD-10-CM | POA: Diagnosis not present

## 2020-05-03 DIAGNOSIS — F064 Anxiety disorder due to known physiological condition: Secondary | ICD-10-CM | POA: Diagnosis not present

## 2020-05-03 DIAGNOSIS — G4701 Insomnia due to medical condition: Secondary | ICD-10-CM | POA: Diagnosis not present

## 2020-05-03 DIAGNOSIS — F339 Major depressive disorder, recurrent, unspecified: Secondary | ICD-10-CM | POA: Diagnosis not present

## 2020-05-03 DIAGNOSIS — F1194 Opioid use, unspecified with opioid-induced mood disorder: Secondary | ICD-10-CM | POA: Diagnosis not present

## 2020-05-04 DIAGNOSIS — L89154 Pressure ulcer of sacral region, stage 4: Secondary | ICD-10-CM | POA: Diagnosis not present

## 2020-05-06 DIAGNOSIS — N390 Urinary tract infection, site not specified: Secondary | ICD-10-CM | POA: Diagnosis not present

## 2020-05-06 DIAGNOSIS — L89154 Pressure ulcer of sacral region, stage 4: Secondary | ICD-10-CM | POA: Diagnosis not present

## 2020-05-06 DIAGNOSIS — R498 Other voice and resonance disorders: Secondary | ICD-10-CM | POA: Diagnosis not present

## 2020-05-06 DIAGNOSIS — L89120 Pressure ulcer of left upper back, unstageable: Secondary | ICD-10-CM | POA: Diagnosis not present

## 2020-05-06 DIAGNOSIS — R262 Difficulty in walking, not elsewhere classified: Secondary | ICD-10-CM | POA: Diagnosis not present

## 2020-05-06 DIAGNOSIS — L89150 Pressure ulcer of sacral region, unstageable: Secondary | ICD-10-CM | POA: Diagnosis not present

## 2020-05-06 DIAGNOSIS — R1312 Dysphagia, oropharyngeal phase: Secondary | ICD-10-CM | POA: Diagnosis not present

## 2020-05-06 DIAGNOSIS — R2681 Unsteadiness on feet: Secondary | ICD-10-CM | POA: Diagnosis not present

## 2020-05-06 DIAGNOSIS — R488 Other symbolic dysfunctions: Secondary | ICD-10-CM | POA: Diagnosis not present

## 2020-05-11 DIAGNOSIS — L89154 Pressure ulcer of sacral region, stage 4: Secondary | ICD-10-CM | POA: Diagnosis not present

## 2020-05-13 DIAGNOSIS — F064 Anxiety disorder due to known physiological condition: Secondary | ICD-10-CM | POA: Diagnosis not present

## 2020-05-13 DIAGNOSIS — G8929 Other chronic pain: Secondary | ICD-10-CM | POA: Diagnosis not present

## 2020-05-13 DIAGNOSIS — I739 Peripheral vascular disease, unspecified: Secondary | ICD-10-CM | POA: Diagnosis not present

## 2020-05-13 DIAGNOSIS — K219 Gastro-esophageal reflux disease without esophagitis: Secondary | ICD-10-CM | POA: Diagnosis not present

## 2020-05-13 DIAGNOSIS — R112 Nausea with vomiting, unspecified: Secondary | ICD-10-CM | POA: Diagnosis not present

## 2020-05-13 DIAGNOSIS — K5900 Constipation, unspecified: Secondary | ICD-10-CM | POA: Diagnosis not present

## 2020-05-14 DIAGNOSIS — E569 Vitamin deficiency, unspecified: Secondary | ICD-10-CM | POA: Diagnosis not present

## 2020-05-14 DIAGNOSIS — I10 Essential (primary) hypertension: Secondary | ICD-10-CM | POA: Diagnosis not present

## 2020-05-16 DIAGNOSIS — E569 Vitamin deficiency, unspecified: Secondary | ICD-10-CM | POA: Diagnosis not present

## 2020-05-16 DIAGNOSIS — E559 Vitamin D deficiency, unspecified: Secondary | ICD-10-CM | POA: Diagnosis not present

## 2020-05-17 DIAGNOSIS — R112 Nausea with vomiting, unspecified: Secondary | ICD-10-CM | POA: Diagnosis not present

## 2020-05-17 DIAGNOSIS — G894 Chronic pain syndrome: Secondary | ICD-10-CM | POA: Diagnosis not present

## 2020-05-17 DIAGNOSIS — F064 Anxiety disorder due to known physiological condition: Secondary | ICD-10-CM | POA: Diagnosis not present

## 2020-05-17 DIAGNOSIS — F339 Major depressive disorder, recurrent, unspecified: Secondary | ICD-10-CM | POA: Diagnosis not present

## 2020-05-17 DIAGNOSIS — N1831 Chronic kidney disease, stage 3a: Secondary | ICD-10-CM | POA: Diagnosis not present

## 2020-05-17 DIAGNOSIS — E876 Hypokalemia: Secondary | ICD-10-CM | POA: Diagnosis not present

## 2020-05-17 DIAGNOSIS — I129 Hypertensive chronic kidney disease with stage 1 through stage 4 chronic kidney disease, or unspecified chronic kidney disease: Secondary | ICD-10-CM | POA: Diagnosis not present

## 2020-05-17 DIAGNOSIS — F1194 Opioid use, unspecified with opioid-induced mood disorder: Secondary | ICD-10-CM | POA: Diagnosis not present

## 2020-05-18 DIAGNOSIS — L89154 Pressure ulcer of sacral region, stage 4: Secondary | ICD-10-CM | POA: Diagnosis not present

## 2020-05-20 DIAGNOSIS — I10 Essential (primary) hypertension: Secondary | ICD-10-CM | POA: Diagnosis not present

## 2020-05-23 DIAGNOSIS — N1831 Chronic kidney disease, stage 3a: Secondary | ICD-10-CM | POA: Diagnosis not present

## 2020-05-23 DIAGNOSIS — K5900 Constipation, unspecified: Secondary | ICD-10-CM | POA: Diagnosis not present

## 2020-05-23 DIAGNOSIS — G894 Chronic pain syndrome: Secondary | ICD-10-CM | POA: Diagnosis not present

## 2020-05-23 DIAGNOSIS — E876 Hypokalemia: Secondary | ICD-10-CM | POA: Diagnosis not present

## 2020-05-23 DIAGNOSIS — N390 Urinary tract infection, site not specified: Secondary | ICD-10-CM | POA: Diagnosis not present

## 2020-06-07 DIAGNOSIS — F1194 Opioid use, unspecified with opioid-induced mood disorder: Secondary | ICD-10-CM | POA: Diagnosis not present

## 2020-06-07 DIAGNOSIS — G4701 Insomnia due to medical condition: Secondary | ICD-10-CM | POA: Diagnosis not present

## 2020-06-07 DIAGNOSIS — F064 Anxiety disorder due to known physiological condition: Secondary | ICD-10-CM | POA: Diagnosis not present

## 2020-06-07 DIAGNOSIS — F331 Major depressive disorder, recurrent, moderate: Secondary | ICD-10-CM | POA: Diagnosis not present

## 2020-06-28 DIAGNOSIS — F064 Anxiety disorder due to known physiological condition: Secondary | ICD-10-CM | POA: Diagnosis not present

## 2020-06-28 DIAGNOSIS — F1194 Opioid use, unspecified with opioid-induced mood disorder: Secondary | ICD-10-CM | POA: Diagnosis not present

## 2020-06-28 DIAGNOSIS — F339 Major depressive disorder, recurrent, unspecified: Secondary | ICD-10-CM | POA: Diagnosis not present

## 2020-07-10 IMAGING — MR MRI OF THE LEFT FOOT WITHOUT CONTRAST
5 series · 40 of 40 positions shown · non-contrast
Comparison: None.

CLINICAL DATA: Erythematous foot. Discoloration. Ulcerations along
the plantar surface.

EXAM:
MRI OF THE LEFT FOOT WITHOUT CONTRAST
TECHNIQUE: Multiplanar, multisequence MR imaging of the left forefoot was
performed. No intravenous contrast was administered.

[Series 4: T1 · coronal · left · 3.0mm · 0.38mm/px · 12 of 50 slices shown (1 of 2)]
[im 1/50]
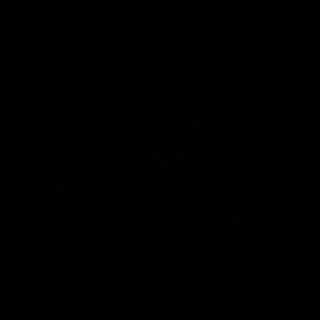
[im 5/50]
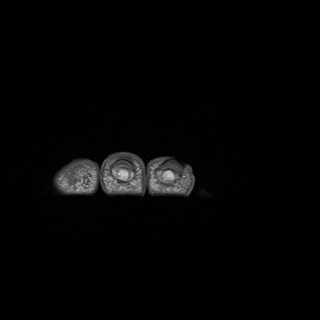
[im 9/50]
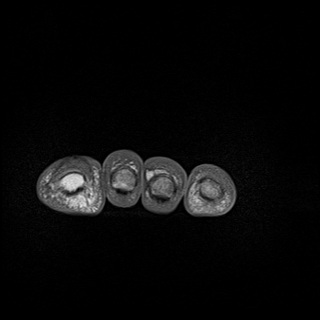
[im 14/50]
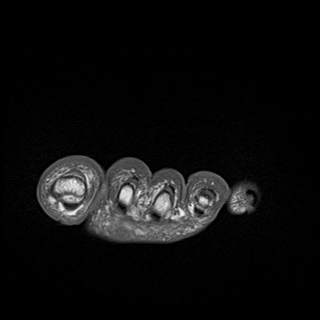
[im 18/50]
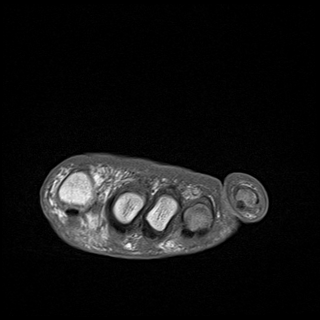
[im 23/50]
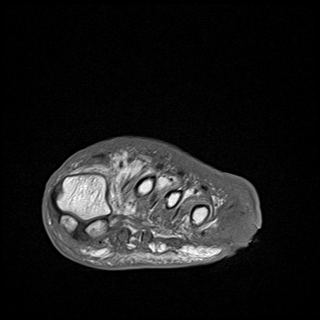
[im 27/50]
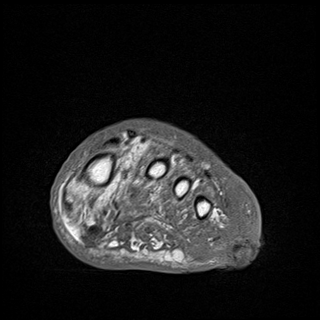
[im 32/50]
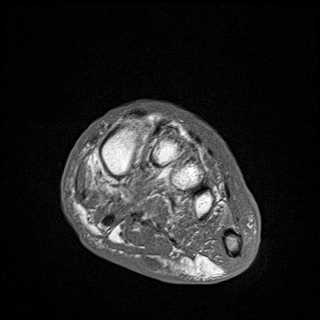
[im 36/50]
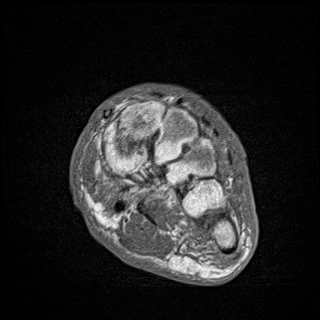
[im 41/50]
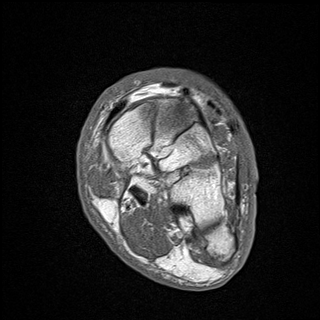
[im 45/50]
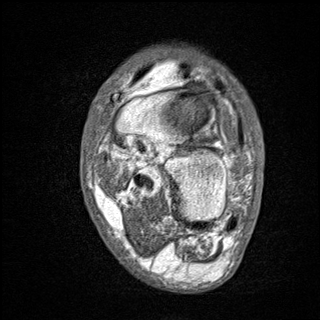
[im 50/50]
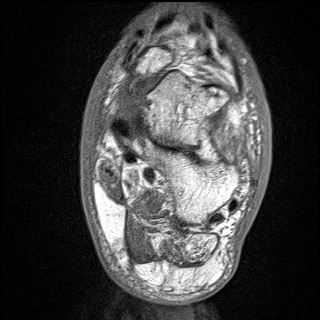

[Series 5: T2 · coronal · left · 3.0mm · 0.38mm/px · 12 of 50 slices shown (1 of 2)]
[im 1/50]
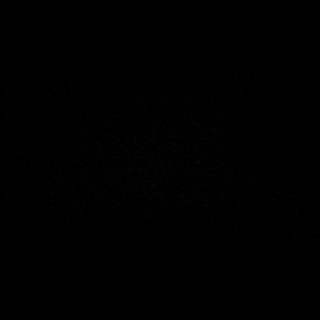
[im 5/50]
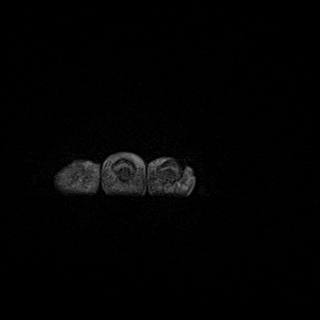
[im 9/50]
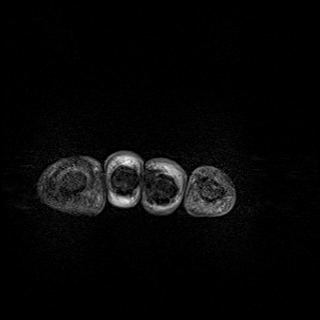
[im 14/50]
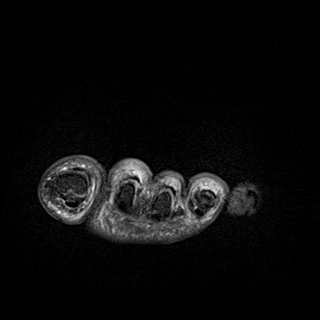
[im 18/50]
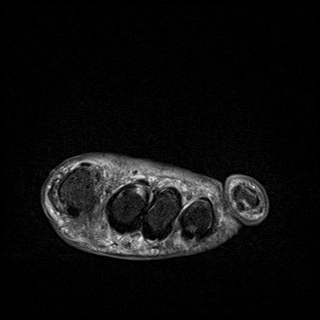
[im 23/50]
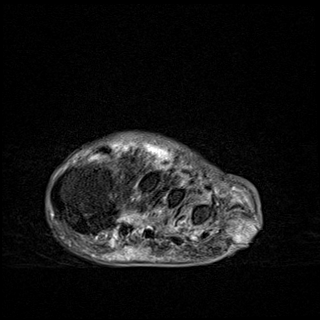
[im 27/50]
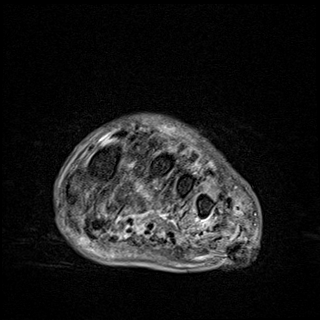
[im 32/50]
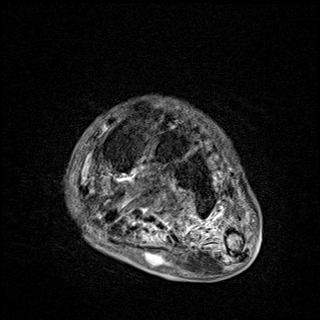
[im 36/50]
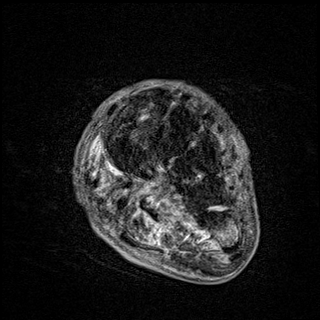
[im 41/50]
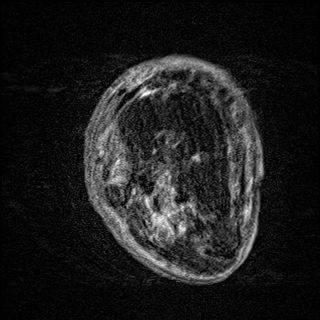
[im 45/50]
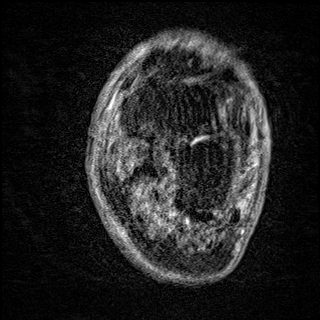
[im 50/50]
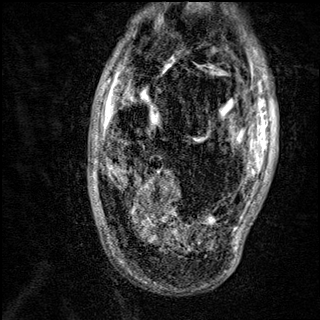

[Series 7: T1 · axial · left · 3.0mm · 0.70mm/px · z∈[-103,-30]mm · 5 of 23 slices shown (2 of 2)]
[im 1/23]
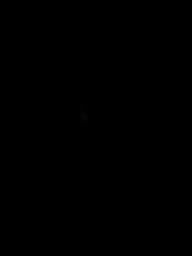
[im 6/23]
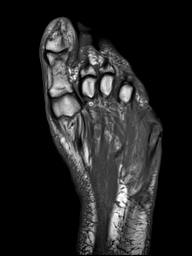
[im 12/23]
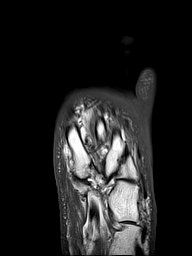
[im 17/23]
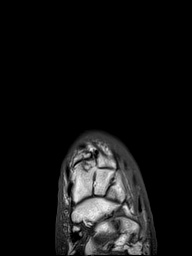
[im 23/23]
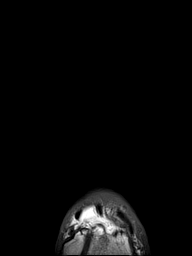

[Series 9: T2 · axial · left · 3.0mm · 0.70mm/px · z∈[-103,-30]mm · 5 of 23 slices shown (2 of 2)]
[im 1/23]
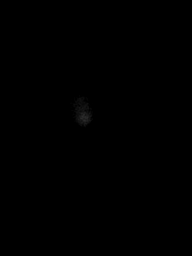
[im 6/23]
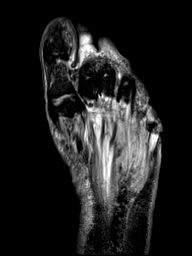
[im 12/23]
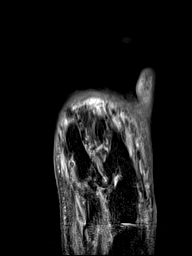
[im 17/23]
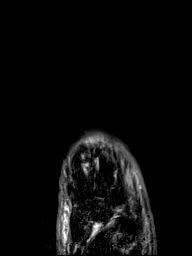
[im 23/23]
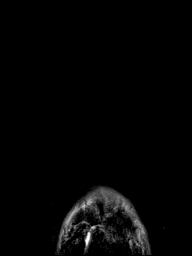

[Series 10: STIR · sagittal · left · 3.0mm · 0.62mm/px · 6 of 27 slices shown]
[im 1/27]
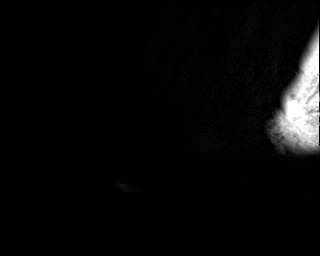
[im 6/27]
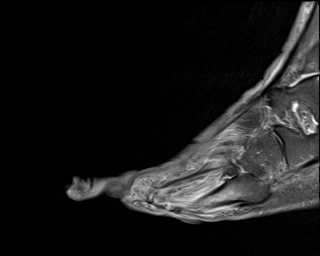
[im 11/27]
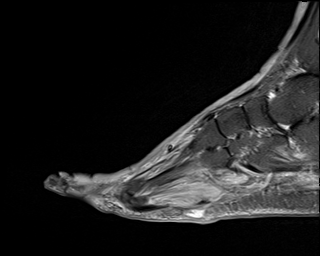
[im 16/27]
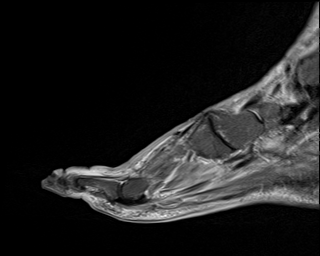
[im 21/27]
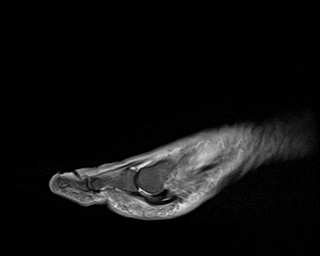
[im 27/27]
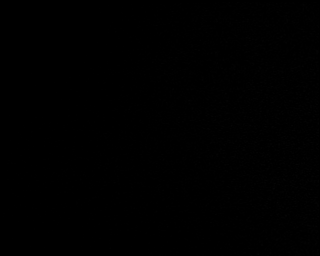

[40 of 40 positions shown; findings below may reference images not displayed]

FINDINGS: Patient motion degrades image quality limiting evaluation.

Bones/Joint/Cartilage

Soft tissue ulcer overlying the fifth MTP joint. Bone destruction of
the fifth metatarsal head with marrow edema throughout the fifth
metatarsal shaft. Mild bone destruction of the base of the fifth
proximal phalanx with associated severe marrow edema. 16 x 4 mm
complex fluid collection in the plantar aspect of forefoot at the
level of the mid third metatarsal in the subcutaneous fat most
consistent with an abscess which appears to emanate from the lateral
soft tissue wound.

No other marrow signal abnormality. No acute fracture or
dislocation.

Ligaments

Collateral ligaments are intact.  Lisfranc ligament is intact.

Muscles and Tendons
Flexor, peroneal and extensor compartment tendons are intact.
Diffuse mild T2 hyperintense signal throughout the plantar
musculature likely neurogenic.

Soft tissue
Mild soft tissue edema along the dorsal aspect of the foot which may
be reactive versus secondary to mild cellulitis. No soft tissue
mass.
IMPRESSION: 1. Soft tissue wound along the fifth MTP joint. Osteomyelitis of the
fifth metatarsal head with partial osteolysis and severe associated
marrow edema. Osteomyelitis of the base of the fifth proximal
phalanx. 16 x 4 mm abscess along the plantar aspect of the forefoot
at the level of the mid third metatarsal within the subcutaneous fat
which emanates from the lateral soft tissue wound.

## 2020-07-12 DIAGNOSIS — F064 Anxiety disorder due to known physiological condition: Secondary | ICD-10-CM | POA: Diagnosis not present

## 2020-07-12 DIAGNOSIS — F331 Major depressive disorder, recurrent, moderate: Secondary | ICD-10-CM | POA: Diagnosis not present

## 2020-07-12 DIAGNOSIS — G4701 Insomnia due to medical condition: Secondary | ICD-10-CM | POA: Diagnosis not present

## 2020-07-13 DIAGNOSIS — E876 Hypokalemia: Secondary | ICD-10-CM | POA: Diagnosis not present

## 2020-07-13 DIAGNOSIS — K5900 Constipation, unspecified: Secondary | ICD-10-CM | POA: Diagnosis not present

## 2020-07-13 DIAGNOSIS — G894 Chronic pain syndrome: Secondary | ICD-10-CM | POA: Diagnosis not present

## 2020-07-19 DIAGNOSIS — L89154 Pressure ulcer of sacral region, stage 4: Secondary | ICD-10-CM | POA: Diagnosis not present

## 2020-07-19 DIAGNOSIS — F1194 Opioid use, unspecified with opioid-induced mood disorder: Secondary | ICD-10-CM | POA: Diagnosis not present

## 2020-07-19 DIAGNOSIS — L89620 Pressure ulcer of left heel, unstageable: Secondary | ICD-10-CM | POA: Diagnosis not present

## 2020-07-19 DIAGNOSIS — Z736 Limitation of activities due to disability: Secondary | ICD-10-CM | POA: Diagnosis not present

## 2020-07-19 DIAGNOSIS — I739 Peripheral vascular disease, unspecified: Secondary | ICD-10-CM | POA: Diagnosis not present

## 2020-07-19 DIAGNOSIS — L89610 Pressure ulcer of right heel, unstageable: Secondary | ICD-10-CM | POA: Diagnosis not present

## 2020-07-19 DIAGNOSIS — F064 Anxiety disorder due to known physiological condition: Secondary | ICD-10-CM | POA: Diagnosis not present

## 2020-07-19 DIAGNOSIS — L89150 Pressure ulcer of sacral region, unstageable: Secondary | ICD-10-CM | POA: Diagnosis not present

## 2020-07-19 DIAGNOSIS — F339 Major depressive disorder, recurrent, unspecified: Secondary | ICD-10-CM | POA: Diagnosis not present

## 2020-07-19 DIAGNOSIS — L89120 Pressure ulcer of left upper back, unstageable: Secondary | ICD-10-CM | POA: Diagnosis not present

## 2020-07-19 DIAGNOSIS — F0150 Vascular dementia without behavioral disturbance: Secondary | ICD-10-CM | POA: Diagnosis not present

## 2020-07-19 DIAGNOSIS — R262 Difficulty in walking, not elsewhere classified: Secondary | ICD-10-CM | POA: Diagnosis not present

## 2020-07-21 ENCOUNTER — Ambulatory Visit: Payer: Medicare HMO

## 2020-07-27 DIAGNOSIS — L89154 Pressure ulcer of sacral region, stage 4: Secondary | ICD-10-CM | POA: Diagnosis not present

## 2020-07-27 DIAGNOSIS — F0150 Vascular dementia without behavioral disturbance: Secondary | ICD-10-CM | POA: Diagnosis not present

## 2020-07-27 DIAGNOSIS — L89610 Pressure ulcer of right heel, unstageable: Secondary | ICD-10-CM | POA: Diagnosis not present

## 2020-07-27 DIAGNOSIS — R262 Difficulty in walking, not elsewhere classified: Secondary | ICD-10-CM | POA: Diagnosis not present

## 2020-07-27 DIAGNOSIS — L89150 Pressure ulcer of sacral region, unstageable: Secondary | ICD-10-CM | POA: Diagnosis not present

## 2020-07-27 DIAGNOSIS — L89620 Pressure ulcer of left heel, unstageable: Secondary | ICD-10-CM | POA: Diagnosis not present

## 2020-07-27 DIAGNOSIS — L89120 Pressure ulcer of left upper back, unstageable: Secondary | ICD-10-CM | POA: Diagnosis not present

## 2020-07-27 DIAGNOSIS — Z736 Limitation of activities due to disability: Secondary | ICD-10-CM | POA: Diagnosis not present

## 2020-07-27 DIAGNOSIS — I739 Peripheral vascular disease, unspecified: Secondary | ICD-10-CM | POA: Diagnosis not present

## 2020-07-28 DIAGNOSIS — F0150 Vascular dementia without behavioral disturbance: Secondary | ICD-10-CM | POA: Diagnosis not present

## 2020-07-28 DIAGNOSIS — L89620 Pressure ulcer of left heel, unstageable: Secondary | ICD-10-CM | POA: Diagnosis not present

## 2020-07-28 DIAGNOSIS — Z736 Limitation of activities due to disability: Secondary | ICD-10-CM | POA: Diagnosis not present

## 2020-07-28 DIAGNOSIS — L89610 Pressure ulcer of right heel, unstageable: Secondary | ICD-10-CM | POA: Diagnosis not present

## 2020-07-28 DIAGNOSIS — L89120 Pressure ulcer of left upper back, unstageable: Secondary | ICD-10-CM | POA: Diagnosis not present

## 2020-07-28 DIAGNOSIS — I739 Peripheral vascular disease, unspecified: Secondary | ICD-10-CM | POA: Diagnosis not present

## 2020-07-28 DIAGNOSIS — R262 Difficulty in walking, not elsewhere classified: Secondary | ICD-10-CM | POA: Diagnosis not present

## 2020-07-28 DIAGNOSIS — L89154 Pressure ulcer of sacral region, stage 4: Secondary | ICD-10-CM | POA: Diagnosis not present

## 2020-07-28 DIAGNOSIS — L89150 Pressure ulcer of sacral region, unstageable: Secondary | ICD-10-CM | POA: Diagnosis not present

## 2020-08-01 DIAGNOSIS — L89120 Pressure ulcer of left upper back, unstageable: Secondary | ICD-10-CM | POA: Diagnosis not present

## 2020-08-01 DIAGNOSIS — L89620 Pressure ulcer of left heel, unstageable: Secondary | ICD-10-CM | POA: Diagnosis not present

## 2020-08-01 DIAGNOSIS — R262 Difficulty in walking, not elsewhere classified: Secondary | ICD-10-CM | POA: Diagnosis not present

## 2020-08-01 DIAGNOSIS — L89610 Pressure ulcer of right heel, unstageable: Secondary | ICD-10-CM | POA: Diagnosis not present

## 2020-08-01 DIAGNOSIS — L89150 Pressure ulcer of sacral region, unstageable: Secondary | ICD-10-CM | POA: Diagnosis not present

## 2020-08-01 DIAGNOSIS — Z736 Limitation of activities due to disability: Secondary | ICD-10-CM | POA: Diagnosis not present

## 2020-08-01 DIAGNOSIS — I739 Peripheral vascular disease, unspecified: Secondary | ICD-10-CM | POA: Diagnosis not present

## 2020-08-01 DIAGNOSIS — F0150 Vascular dementia without behavioral disturbance: Secondary | ICD-10-CM | POA: Diagnosis not present

## 2020-08-01 DIAGNOSIS — L89154 Pressure ulcer of sacral region, stage 4: Secondary | ICD-10-CM | POA: Diagnosis not present

## 2020-08-02 DIAGNOSIS — R262 Difficulty in walking, not elsewhere classified: Secondary | ICD-10-CM | POA: Diagnosis not present

## 2020-08-02 DIAGNOSIS — F0150 Vascular dementia without behavioral disturbance: Secondary | ICD-10-CM | POA: Diagnosis not present

## 2020-08-02 DIAGNOSIS — Z736 Limitation of activities due to disability: Secondary | ICD-10-CM | POA: Diagnosis not present

## 2020-08-02 DIAGNOSIS — L89120 Pressure ulcer of left upper back, unstageable: Secondary | ICD-10-CM | POA: Diagnosis not present

## 2020-08-02 DIAGNOSIS — L89610 Pressure ulcer of right heel, unstageable: Secondary | ICD-10-CM | POA: Diagnosis not present

## 2020-08-02 DIAGNOSIS — F1194 Opioid use, unspecified with opioid-induced mood disorder: Secondary | ICD-10-CM | POA: Diagnosis not present

## 2020-08-02 DIAGNOSIS — L89150 Pressure ulcer of sacral region, unstageable: Secondary | ICD-10-CM | POA: Diagnosis not present

## 2020-08-02 DIAGNOSIS — L89154 Pressure ulcer of sacral region, stage 4: Secondary | ICD-10-CM | POA: Diagnosis not present

## 2020-08-02 DIAGNOSIS — F339 Major depressive disorder, recurrent, unspecified: Secondary | ICD-10-CM | POA: Diagnosis not present

## 2020-08-02 DIAGNOSIS — F064 Anxiety disorder due to known physiological condition: Secondary | ICD-10-CM | POA: Diagnosis not present

## 2020-08-02 DIAGNOSIS — L89620 Pressure ulcer of left heel, unstageable: Secondary | ICD-10-CM | POA: Diagnosis not present

## 2020-08-02 DIAGNOSIS — I739 Peripheral vascular disease, unspecified: Secondary | ICD-10-CM | POA: Diagnosis not present

## 2020-08-03 DIAGNOSIS — I739 Peripheral vascular disease, unspecified: Secondary | ICD-10-CM | POA: Diagnosis not present

## 2020-08-03 DIAGNOSIS — L89154 Pressure ulcer of sacral region, stage 4: Secondary | ICD-10-CM | POA: Diagnosis not present

## 2020-08-03 DIAGNOSIS — I10 Essential (primary) hypertension: Secondary | ICD-10-CM | POA: Diagnosis not present

## 2020-08-03 DIAGNOSIS — G894 Chronic pain syndrome: Secondary | ICD-10-CM | POA: Diagnosis not present

## 2020-08-03 DIAGNOSIS — F0150 Vascular dementia without behavioral disturbance: Secondary | ICD-10-CM | POA: Diagnosis not present

## 2020-08-03 DIAGNOSIS — L89150 Pressure ulcer of sacral region, unstageable: Secondary | ICD-10-CM | POA: Diagnosis not present

## 2020-08-03 DIAGNOSIS — L89620 Pressure ulcer of left heel, unstageable: Secondary | ICD-10-CM | POA: Diagnosis not present

## 2020-08-03 DIAGNOSIS — Z736 Limitation of activities due to disability: Secondary | ICD-10-CM | POA: Diagnosis not present

## 2020-08-03 DIAGNOSIS — F1511 Other stimulant abuse, in remission: Secondary | ICD-10-CM | POA: Diagnosis not present

## 2020-08-03 DIAGNOSIS — L89610 Pressure ulcer of right heel, unstageable: Secondary | ICD-10-CM | POA: Diagnosis not present

## 2020-08-03 DIAGNOSIS — L89120 Pressure ulcer of left upper back, unstageable: Secondary | ICD-10-CM | POA: Diagnosis not present

## 2020-08-03 DIAGNOSIS — R262 Difficulty in walking, not elsewhere classified: Secondary | ICD-10-CM | POA: Diagnosis not present

## 2020-08-03 DIAGNOSIS — F339 Major depressive disorder, recurrent, unspecified: Secondary | ICD-10-CM | POA: Diagnosis not present

## 2020-08-03 DIAGNOSIS — F064 Anxiety disorder due to known physiological condition: Secondary | ICD-10-CM | POA: Diagnosis not present

## 2020-08-04 DIAGNOSIS — R262 Difficulty in walking, not elsewhere classified: Secondary | ICD-10-CM | POA: Diagnosis not present

## 2020-08-04 DIAGNOSIS — L89610 Pressure ulcer of right heel, unstageable: Secondary | ICD-10-CM | POA: Diagnosis not present

## 2020-08-04 DIAGNOSIS — L89150 Pressure ulcer of sacral region, unstageable: Secondary | ICD-10-CM | POA: Diagnosis not present

## 2020-08-04 DIAGNOSIS — L89154 Pressure ulcer of sacral region, stage 4: Secondary | ICD-10-CM | POA: Diagnosis not present

## 2020-08-04 DIAGNOSIS — F0150 Vascular dementia without behavioral disturbance: Secondary | ICD-10-CM | POA: Diagnosis not present

## 2020-08-04 DIAGNOSIS — I739 Peripheral vascular disease, unspecified: Secondary | ICD-10-CM | POA: Diagnosis not present

## 2020-08-04 DIAGNOSIS — Z736 Limitation of activities due to disability: Secondary | ICD-10-CM | POA: Diagnosis not present

## 2020-08-04 DIAGNOSIS — L89620 Pressure ulcer of left heel, unstageable: Secondary | ICD-10-CM | POA: Diagnosis not present

## 2020-08-04 DIAGNOSIS — L89120 Pressure ulcer of left upper back, unstageable: Secondary | ICD-10-CM | POA: Diagnosis not present

## 2020-08-05 DIAGNOSIS — L89610 Pressure ulcer of right heel, unstageable: Secondary | ICD-10-CM | POA: Diagnosis not present

## 2020-08-05 DIAGNOSIS — L89120 Pressure ulcer of left upper back, unstageable: Secondary | ICD-10-CM | POA: Diagnosis not present

## 2020-08-05 DIAGNOSIS — F0150 Vascular dementia without behavioral disturbance: Secondary | ICD-10-CM | POA: Diagnosis not present

## 2020-08-05 DIAGNOSIS — Z736 Limitation of activities due to disability: Secondary | ICD-10-CM | POA: Diagnosis not present

## 2020-08-05 DIAGNOSIS — L89620 Pressure ulcer of left heel, unstageable: Secondary | ICD-10-CM | POA: Diagnosis not present

## 2020-08-05 DIAGNOSIS — R262 Difficulty in walking, not elsewhere classified: Secondary | ICD-10-CM | POA: Diagnosis not present

## 2020-08-05 DIAGNOSIS — L89154 Pressure ulcer of sacral region, stage 4: Secondary | ICD-10-CM | POA: Diagnosis not present

## 2020-08-05 DIAGNOSIS — I739 Peripheral vascular disease, unspecified: Secondary | ICD-10-CM | POA: Diagnosis not present

## 2020-08-05 DIAGNOSIS — L89150 Pressure ulcer of sacral region, unstageable: Secondary | ICD-10-CM | POA: Diagnosis not present

## 2020-08-16 DIAGNOSIS — F064 Anxiety disorder due to known physiological condition: Secondary | ICD-10-CM | POA: Diagnosis not present

## 2020-08-16 DIAGNOSIS — F1194 Opioid use, unspecified with opioid-induced mood disorder: Secondary | ICD-10-CM | POA: Diagnosis not present

## 2020-08-16 DIAGNOSIS — F339 Major depressive disorder, recurrent, unspecified: Secondary | ICD-10-CM | POA: Diagnosis not present

## 2020-08-30 DIAGNOSIS — F339 Major depressive disorder, recurrent, unspecified: Secondary | ICD-10-CM | POA: Diagnosis not present

## 2020-08-30 DIAGNOSIS — F064 Anxiety disorder due to known physiological condition: Secondary | ICD-10-CM | POA: Diagnosis not present

## 2020-08-30 DIAGNOSIS — G4701 Insomnia due to medical condition: Secondary | ICD-10-CM | POA: Diagnosis not present

## 2020-08-30 DIAGNOSIS — F1194 Opioid use, unspecified with opioid-induced mood disorder: Secondary | ICD-10-CM | POA: Diagnosis not present

## 2020-09-12 DIAGNOSIS — G4701 Insomnia due to medical condition: Secondary | ICD-10-CM | POA: Diagnosis not present

## 2020-09-12 DIAGNOSIS — F064 Anxiety disorder due to known physiological condition: Secondary | ICD-10-CM | POA: Diagnosis not present

## 2020-09-12 DIAGNOSIS — F331 Major depressive disorder, recurrent, moderate: Secondary | ICD-10-CM | POA: Diagnosis not present

## 2020-09-12 DIAGNOSIS — F1194 Opioid use, unspecified with opioid-induced mood disorder: Secondary | ICD-10-CM | POA: Diagnosis not present

## 2020-09-13 DIAGNOSIS — F1194 Opioid use, unspecified with opioid-induced mood disorder: Secondary | ICD-10-CM | POA: Diagnosis not present

## 2020-09-13 DIAGNOSIS — R1310 Dysphagia, unspecified: Secondary | ICD-10-CM | POA: Diagnosis not present

## 2020-09-13 DIAGNOSIS — D649 Anemia, unspecified: Secondary | ICD-10-CM | POA: Diagnosis not present

## 2020-09-13 DIAGNOSIS — G894 Chronic pain syndrome: Secondary | ICD-10-CM | POA: Diagnosis not present

## 2020-09-13 DIAGNOSIS — K5900 Constipation, unspecified: Secondary | ICD-10-CM | POA: Diagnosis not present

## 2020-09-13 DIAGNOSIS — R1312 Dysphagia, oropharyngeal phase: Secondary | ICD-10-CM | POA: Diagnosis not present

## 2020-09-13 DIAGNOSIS — F0150 Vascular dementia without behavioral disturbance: Secondary | ICD-10-CM | POA: Diagnosis not present

## 2020-09-13 DIAGNOSIS — F064 Anxiety disorder due to known physiological condition: Secondary | ICD-10-CM | POA: Diagnosis not present

## 2020-09-13 DIAGNOSIS — R262 Difficulty in walking, not elsewhere classified: Secondary | ICD-10-CM | POA: Diagnosis not present

## 2020-09-13 DIAGNOSIS — I10 Essential (primary) hypertension: Secondary | ICD-10-CM | POA: Diagnosis not present

## 2020-09-13 DIAGNOSIS — R2681 Unsteadiness on feet: Secondary | ICD-10-CM | POA: Diagnosis not present

## 2020-09-13 DIAGNOSIS — F331 Major depressive disorder, recurrent, moderate: Secondary | ICD-10-CM | POA: Diagnosis not present

## 2020-09-13 DIAGNOSIS — M6281 Muscle weakness (generalized): Secondary | ICD-10-CM | POA: Diagnosis not present

## 2020-09-13 DIAGNOSIS — L89120 Pressure ulcer of left upper back, unstageable: Secondary | ICD-10-CM | POA: Diagnosis not present

## 2020-09-13 DIAGNOSIS — F339 Major depressive disorder, recurrent, unspecified: Secondary | ICD-10-CM | POA: Diagnosis not present

## 2020-09-13 DIAGNOSIS — R498 Other voice and resonance disorders: Secondary | ICD-10-CM | POA: Diagnosis not present

## 2020-09-13 DIAGNOSIS — E785 Hyperlipidemia, unspecified: Secondary | ICD-10-CM | POA: Diagnosis not present

## 2020-09-14 DIAGNOSIS — I10 Essential (primary) hypertension: Secondary | ICD-10-CM | POA: Diagnosis not present

## 2020-09-14 DIAGNOSIS — D500 Iron deficiency anemia secondary to blood loss (chronic): Secondary | ICD-10-CM | POA: Diagnosis not present

## 2020-09-16 DIAGNOSIS — R498 Other voice and resonance disorders: Secondary | ICD-10-CM | POA: Diagnosis not present

## 2020-09-16 DIAGNOSIS — R1312 Dysphagia, oropharyngeal phase: Secondary | ICD-10-CM | POA: Diagnosis not present

## 2020-09-16 DIAGNOSIS — L89120 Pressure ulcer of left upper back, unstageable: Secondary | ICD-10-CM | POA: Diagnosis not present

## 2020-09-16 DIAGNOSIS — R262 Difficulty in walking, not elsewhere classified: Secondary | ICD-10-CM | POA: Diagnosis not present

## 2020-09-16 DIAGNOSIS — R2681 Unsteadiness on feet: Secondary | ICD-10-CM | POA: Diagnosis not present

## 2020-09-16 DIAGNOSIS — M6281 Muscle weakness (generalized): Secondary | ICD-10-CM | POA: Diagnosis not present

## 2020-09-19 DIAGNOSIS — R1312 Dysphagia, oropharyngeal phase: Secondary | ICD-10-CM | POA: Diagnosis not present

## 2020-09-19 DIAGNOSIS — M6281 Muscle weakness (generalized): Secondary | ICD-10-CM | POA: Diagnosis not present

## 2020-09-19 DIAGNOSIS — R262 Difficulty in walking, not elsewhere classified: Secondary | ICD-10-CM | POA: Diagnosis not present

## 2020-09-19 DIAGNOSIS — R498 Other voice and resonance disorders: Secondary | ICD-10-CM | POA: Diagnosis not present

## 2020-09-19 DIAGNOSIS — L89120 Pressure ulcer of left upper back, unstageable: Secondary | ICD-10-CM | POA: Diagnosis not present

## 2020-09-19 DIAGNOSIS — R2681 Unsteadiness on feet: Secondary | ICD-10-CM | POA: Diagnosis not present

## 2020-09-20 DIAGNOSIS — L89120 Pressure ulcer of left upper back, unstageable: Secondary | ICD-10-CM | POA: Diagnosis not present

## 2020-09-20 DIAGNOSIS — R1312 Dysphagia, oropharyngeal phase: Secondary | ICD-10-CM | POA: Diagnosis not present

## 2020-09-20 DIAGNOSIS — R262 Difficulty in walking, not elsewhere classified: Secondary | ICD-10-CM | POA: Diagnosis not present

## 2020-09-20 DIAGNOSIS — M6281 Muscle weakness (generalized): Secondary | ICD-10-CM | POA: Diagnosis not present

## 2020-09-20 DIAGNOSIS — R498 Other voice and resonance disorders: Secondary | ICD-10-CM | POA: Diagnosis not present

## 2020-09-20 DIAGNOSIS — R2681 Unsteadiness on feet: Secondary | ICD-10-CM | POA: Diagnosis not present

## 2020-09-21 ENCOUNTER — Other Ambulatory Visit (INDEPENDENT_AMBULATORY_CARE_PROVIDER_SITE_OTHER): Payer: Self-pay | Admitting: Nurse Practitioner

## 2020-09-21 DIAGNOSIS — M6281 Muscle weakness (generalized): Secondary | ICD-10-CM | POA: Diagnosis not present

## 2020-09-21 DIAGNOSIS — R498 Other voice and resonance disorders: Secondary | ICD-10-CM | POA: Diagnosis not present

## 2020-09-21 DIAGNOSIS — R1312 Dysphagia, oropharyngeal phase: Secondary | ICD-10-CM | POA: Diagnosis not present

## 2020-09-21 DIAGNOSIS — R262 Difficulty in walking, not elsewhere classified: Secondary | ICD-10-CM | POA: Diagnosis not present

## 2020-09-21 DIAGNOSIS — I70213 Atherosclerosis of native arteries of extremities with intermittent claudication, bilateral legs: Secondary | ICD-10-CM

## 2020-09-21 DIAGNOSIS — L89120 Pressure ulcer of left upper back, unstageable: Secondary | ICD-10-CM | POA: Diagnosis not present

## 2020-09-21 DIAGNOSIS — R2681 Unsteadiness on feet: Secondary | ICD-10-CM | POA: Diagnosis not present

## 2020-09-21 NOTE — Progress Notes (Deleted)
MRN : [id number]  Yesenia Shepard is a 70 y.o. (Nov 06, 1950) female who presents with chief complaint of No chief complaint on file. Marland Kitchen  History of Present Illness:   The patient returns to the office for followup and review of the noninvasive studies. There have been no interval changes in lower extremity symptoms. No interval shortening of the patient's claudication distance or development of rest pain symptoms. No new ulcers or wounds have occurred since the last visit.  There have been no significant changes to the patient's overall health care.  The patient denies amaurosis fugax or recent TIA symptoms. There are no recent neurological changes noted. The patient denies history of DVT, PE or superficial thrombophlebitis. The patient denies recent episodes of angina or shortness of breath.   ABI Rt=1.08 and Lt=1.07  (previous ABI's Rt=1.38 and Lt=1.36)  No outpatient medications have been marked as taking for the 09/22/20 encounter (Appointment) with Delana Meyer, Dolores Lory, MD.    Past Medical History:  Diagnosis Date  . Anxiety   . Arthritis   . Chronic pain   . Depression   . Diabetes mellitus without complication ([place])   . Drug abuse ([place])    History of polysubstance abuse; currently on methadone.  . Hepatitis    History of Hep "C". treated and cured with Harvoni  . History of chicken pox   . Hypertension   . Panic attacks   . Peripheral vascular disease ([place])    stent in place.   Marland Kitchen UTI (urinary tract infection)    History of    Past Surgical History:  Procedure Laterality Date  . ABDOMINAL HYSTERECTOMY  1989  . AMPUTATION TOE Left 10/26/2018   Procedure: 5TH RAY RESCTION;  Surgeon: Albertine Patricia, DPM;  Location: [facility];  Service: Podiatry;  Laterality: Left;  . APPLICATION OF WOUND VAC N/A 02/05/2019   Procedure: APPLICATION OF WOUND VAC POSS DEBRIDEMENT;  Surgeon: Fredirick Maudlin, MD;  Location: [facility];  Service: General;  Laterality: N/A;  . APPLICATION OF  WOUND VAC  02/03/2019   Procedure: APPLICATION OF WOUND VAC;  Surgeon: Fredirick Maudlin, MD;  Location: [facility];  Service: General;;  . CHOLECYSTECTOMY  1987  . INTRACAPSULAR CATARACT EXTRACTION Left   . IRRIGATION AND DEBRIDEMENT ABSCESS N/A 02/03/2019   Procedure: IRRIGATION AND DEBRIDEMENT SACRAL DECUBITUS;  Surgeon: Fredirick Maudlin, MD;  Location: [facility];  Service: General;  Laterality: N/A;  . LOWER EXTREMITY ANGIOGRAPHY Left 12/17/2017   Procedure: LOWER EXTREMITY ANGIOGRAPHY;  Surgeon: Katha Cabal, MD;  Location: [place] [facility];  Service: Cardiovascular;  Laterality: Left;  . LOWER EXTREMITY ANGIOGRAPHY Left 10/24/2018   Procedure: LOWER EXTREMITY ANGIOGRAPHY;  Surgeon: Algernon Huxley, MD;  Location: [place] [facility];  Service: Cardiovascular;  Laterality: Left;  . PERIPHERAL VASCULAR CATHETERIZATION Left 05/04/2015   Procedure: Lower Extremity Angiography;  Surgeon: Katha Cabal, MD;  Location: [place] [facility];  Service: Cardiovascular;  Laterality: Left;  . PERIPHERAL VASCULAR CATHETERIZATION Left 05/04/2015   Procedure: Lower Extremity Intervention;  Surgeon: Katha Cabal, MD;  Location: [place] [facility];  Service: Cardiovascular;  Laterality: Left;  . SHOULDER ARTHROSCOPY WITH OPEN ROTATOR CUFF REPAIR Right 01/16/2017   Procedure: SHOULDER ARTHROSCOPY WITH OPEN ROTATOR CUFF REPAIR;  Surgeon: Thornton Park, MD;  Location: [facility];  Service: Orthopedics;  Laterality: Right;  . TONSILLECTOMY AND ADENOIDECTOMY  1971    Social History Social History   Tobacco Use  . Smoking status: Former Smoker  Packs/day: 0.50    Years: 30.00    Pack years: 15.00    Types: Cigarettes    Quit date: 07/25/2018    Years since quitting: 2.1  . Smokeless tobacco: Never Used  Vaping Use  . Vaping Use: Never used  Substance Use Topics  . Alcohol use: No    Alcohol/week: 0.0 standard drinks  . Drug use: Not Currently    Comment: Hx of.    Family  History Family History  Problem Relation Age of Onset  . Arthritis Mother   . Mental illness Mother        depression  . Diabetes Mother   . Cancer Mother        pancreatic  . Cancer Father        colon  . Heart disease Father   . Diabetes Father   . Cancer Daughter        lung    No Known Allergies   REVIEW OF SYSTEMS (Negative unless checked)  Constitutional: '[]'$ Weight loss  '[]'$ Fever  '[]'$ Chills Cardiac: '[]'$ Chest pain   '[]'$ Chest pressure   '[]'$ Palpitations   '[]'$ Shortness of breath when laying flat   '[]'$ Shortness of breath with exertion. Vascular:  '[x]'$ Pain in legs with walking   '[]'$ Pain in legs at rest  '[]'$ History of DVT   '[]'$ Phlebitis   '[]'$ Swelling in legs   '[]'$ Varicose veins   '[]'$ Non-healing ulcers Pulmonary:   '[]'$ Uses home oxygen   '[]'$ Productive cough   '[]'$ Hemoptysis   '[]'$ Wheeze  '[]'$ COPD   '[]'$ Asthma Neurologic:  '[]'$ Dizziness   '[]'$ Seizures   '[]'$ History of stroke   '[]'$ History of TIA  '[]'$ Aphasia   '[]'$ Vissual changes   '[]'$ Weakness or numbness in arm   '[]'$ Weakness or numbness in leg Musculoskeletal:   '[]'$ Joint swelling   '[]'$ Joint pain   '[]'$ Low back pain Hematologic:  '[]'$ Easy bruising  '[]'$ Easy bleeding   '[]'$ Hypercoagulable state   '[]'$ Anemic Gastrointestinal:  '[]'$ Diarrhea   '[]'$ Vomiting  '[]'$ Gastroesophageal reflux/heartburn   '[]'$ Difficulty swallowing. Genitourinary:  '[]'$ Chronic kidney disease   '[]'$ Difficult urination  '[]'$ Frequent urination   '[]'$ Blood in urine Skin:  '[]'$ Rashes   '[]'$ Ulcers  Psychological:  '[]'$ History of anxiety   '[]'$  History of major depression.  Physical Examination  There were no vitals filed for this visit. There is no height or weight on file to calculate BMI. Gen: WD/WN, NAD Head: [place]/AT, No temporalis wasting.  Ear/Nose/Throat: Hearing grossly intact, nares w/o erythema or drainage Eyes: PER, EOMI, sclera nonicteric.  Neck: Supple, no large masses.   Pulmonary:  Good air movement, no audible wheezing bilaterally, no use of accessory muscles.  Cardiac: RRR, no JVD Vascular:  Vessel Right Left  Radial  Palpable Palpable  PT Palpable Palpable  DP Palpable Palpable  Gastrointestinal: Non-distended. No guarding/no peritoneal signs.  Musculoskeletal: M/S 5/5 throughout.  No deformity or atrophy.  Neurologic: CN 2-12 intact. Symmetrical.  Speech is fluent. Motor exam as listed above. Psychiatric: Judgment intact, Mood & affect appropriate for pt's clinical situation. Dermatologic: No rashes or ulcers noted.  No changes consistent with cellulitis.  CBC Lab Results  Component Value Date   WBC 11.2 (H) 02/08/2019   HGB 7.8 (L) 02/08/2019   HCT 25.3 (L) 02/08/2019   MCV 95.1 02/08/2019   PLT 494 (H) 02/08/2019    BMET    Component Value Date/Time   NA 145 02/11/2019 0656   NA 139 02/04/2015 1305   K 4.1 02/11/2019 0656   CL 110 02/11/2019 0656   CO2 26 02/11/2019 0656   GLUCOSE 122 (  H) 02/11/2019 0656   BUN 11 02/11/2019 0656   BUN 10 02/04/2015 1305   CREATININE 0.86 02/11/2019 0656   CREATININE 0.76 01/05/2016 1524   CALCIUM 8.1 (L) 02/11/2019 0656   GFRNONAA >60 02/11/2019 0656   GFRNONAA 83 01/05/2016 1524   GFRAA >60 02/11/2019 0656   GFRAA >89 01/05/2016 1524   CrCl cannot be calculated (Patient's most recent lab result is older than the maximum 21 days allowed.).  COAG Lab Results  Component Value Date   INR 1.3 (H) 01/28/2019   INR 1.06 01/10/2017   INR 1.17 10/18/2015    Radiology No results found.   Assessment/Plan There are no diagnoses linked to this encounter.   Hortencia Pilar, MD  09/21/2020 9:22 PM

## 2020-09-22 ENCOUNTER — Ambulatory Visit (INDEPENDENT_AMBULATORY_CARE_PROVIDER_SITE_OTHER): Payer: Medicare HMO | Admitting: Vascular Surgery

## 2020-09-22 ENCOUNTER — Encounter (INDEPENDENT_AMBULATORY_CARE_PROVIDER_SITE_OTHER): Payer: Medicare HMO

## 2020-09-22 DIAGNOSIS — I10 Essential (primary) hypertension: Secondary | ICD-10-CM

## 2020-09-22 DIAGNOSIS — L89120 Pressure ulcer of left upper back, unstageable: Secondary | ICD-10-CM | POA: Diagnosis not present

## 2020-09-22 DIAGNOSIS — E119 Type 2 diabetes mellitus without complications: Secondary | ICD-10-CM

## 2020-09-22 DIAGNOSIS — E785 Hyperlipidemia, unspecified: Secondary | ICD-10-CM

## 2020-09-22 DIAGNOSIS — M6281 Muscle weakness (generalized): Secondary | ICD-10-CM | POA: Diagnosis not present

## 2020-09-22 DIAGNOSIS — I70213 Atherosclerosis of native arteries of extremities with intermittent claudication, bilateral legs: Secondary | ICD-10-CM

## 2020-09-22 DIAGNOSIS — R2681 Unsteadiness on feet: Secondary | ICD-10-CM | POA: Diagnosis not present

## 2020-09-22 DIAGNOSIS — R262 Difficulty in walking, not elsewhere classified: Secondary | ICD-10-CM | POA: Diagnosis not present

## 2020-09-22 DIAGNOSIS — R498 Other voice and resonance disorders: Secondary | ICD-10-CM | POA: Diagnosis not present

## 2020-09-22 DIAGNOSIS — R1312 Dysphagia, oropharyngeal phase: Secondary | ICD-10-CM | POA: Diagnosis not present

## 2020-09-23 DIAGNOSIS — M6281 Muscle weakness (generalized): Secondary | ICD-10-CM | POA: Diagnosis not present

## 2020-09-23 DIAGNOSIS — L89120 Pressure ulcer of left upper back, unstageable: Secondary | ICD-10-CM | POA: Diagnosis not present

## 2020-09-23 DIAGNOSIS — R2681 Unsteadiness on feet: Secondary | ICD-10-CM | POA: Diagnosis not present

## 2020-09-23 DIAGNOSIS — R498 Other voice and resonance disorders: Secondary | ICD-10-CM | POA: Diagnosis not present

## 2020-09-23 DIAGNOSIS — R262 Difficulty in walking, not elsewhere classified: Secondary | ICD-10-CM | POA: Diagnosis not present

## 2020-09-23 DIAGNOSIS — R1312 Dysphagia, oropharyngeal phase: Secondary | ICD-10-CM | POA: Diagnosis not present

## 2020-09-26 DIAGNOSIS — L89120 Pressure ulcer of left upper back, unstageable: Secondary | ICD-10-CM | POA: Diagnosis not present

## 2020-09-26 DIAGNOSIS — R2681 Unsteadiness on feet: Secondary | ICD-10-CM | POA: Diagnosis not present

## 2020-09-26 DIAGNOSIS — M6281 Muscle weakness (generalized): Secondary | ICD-10-CM | POA: Diagnosis not present

## 2020-09-26 DIAGNOSIS — R1312 Dysphagia, oropharyngeal phase: Secondary | ICD-10-CM | POA: Diagnosis not present

## 2020-09-26 DIAGNOSIS — R498 Other voice and resonance disorders: Secondary | ICD-10-CM | POA: Diagnosis not present

## 2020-09-26 DIAGNOSIS — R262 Difficulty in walking, not elsewhere classified: Secondary | ICD-10-CM | POA: Diagnosis not present

## 2020-10-04 DIAGNOSIS — F1194 Opioid use, unspecified with opioid-induced mood disorder: Secondary | ICD-10-CM | POA: Diagnosis not present

## 2020-10-04 DIAGNOSIS — F064 Anxiety disorder due to known physiological condition: Secondary | ICD-10-CM | POA: Diagnosis not present

## 2020-10-04 DIAGNOSIS — F339 Major depressive disorder, recurrent, unspecified: Secondary | ICD-10-CM | POA: Diagnosis not present

## 2020-10-11 DIAGNOSIS — L89159 Pressure ulcer of sacral region, unspecified stage: Secondary | ICD-10-CM | POA: Diagnosis not present

## 2020-10-11 DIAGNOSIS — F331 Major depressive disorder, recurrent, moderate: Secondary | ICD-10-CM | POA: Diagnosis not present

## 2020-10-11 DIAGNOSIS — I10 Essential (primary) hypertension: Secondary | ICD-10-CM | POA: Diagnosis not present

## 2020-10-11 DIAGNOSIS — I129 Hypertensive chronic kidney disease with stage 1 through stage 4 chronic kidney disease, or unspecified chronic kidney disease: Secondary | ICD-10-CM | POA: Diagnosis not present

## 2020-10-11 DIAGNOSIS — F064 Anxiety disorder due to known physiological condition: Secondary | ICD-10-CM | POA: Diagnosis not present

## 2020-10-11 DIAGNOSIS — F1511 Other stimulant abuse, in remission: Secondary | ICD-10-CM | POA: Diagnosis not present

## 2020-10-11 DIAGNOSIS — G894 Chronic pain syndrome: Secondary | ICD-10-CM | POA: Diagnosis not present

## 2020-10-11 DIAGNOSIS — N1831 Chronic kidney disease, stage 3a: Secondary | ICD-10-CM | POA: Diagnosis not present

## 2020-10-18 DIAGNOSIS — G4701 Insomnia due to medical condition: Secondary | ICD-10-CM | POA: Diagnosis not present

## 2020-10-18 DIAGNOSIS — F064 Anxiety disorder due to known physiological condition: Secondary | ICD-10-CM | POA: Diagnosis not present

## 2020-10-18 DIAGNOSIS — F331 Major depressive disorder, recurrent, moderate: Secondary | ICD-10-CM | POA: Diagnosis not present

## 2020-10-19 DIAGNOSIS — W19XXXA Unspecified fall, initial encounter: Secondary | ICD-10-CM | POA: Diagnosis not present

## 2020-10-19 DIAGNOSIS — F1511 Other stimulant abuse, in remission: Secondary | ICD-10-CM | POA: Diagnosis not present

## 2020-10-19 DIAGNOSIS — L89159 Pressure ulcer of sacral region, unspecified stage: Secondary | ICD-10-CM | POA: Diagnosis not present

## 2020-10-19 DIAGNOSIS — M79601 Pain in right arm: Secondary | ICD-10-CM | POA: Diagnosis not present

## 2020-10-19 DIAGNOSIS — I10 Essential (primary) hypertension: Secondary | ICD-10-CM | POA: Diagnosis not present

## 2020-10-19 DIAGNOSIS — G894 Chronic pain syndrome: Secondary | ICD-10-CM | POA: Diagnosis not present

## 2020-10-19 DIAGNOSIS — M79605 Pain in left leg: Secondary | ICD-10-CM | POA: Diagnosis not present

## 2020-10-19 DIAGNOSIS — R635 Abnormal weight gain: Secondary | ICD-10-CM | POA: Diagnosis not present

## 2020-10-20 ENCOUNTER — Other Ambulatory Visit: Payer: Self-pay

## 2020-10-20 ENCOUNTER — Emergency Department: Payer: Medicare HMO

## 2020-10-20 ENCOUNTER — Emergency Department
Admission: EM | Admit: 2020-10-20 | Discharge: 2020-10-20 | Disposition: A | Discharge status: Home / Self Care | Payer: Medicare HMO | Attending: Emergency Medicine | Admitting: Emergency Medicine

## 2020-10-20 DIAGNOSIS — R488 Other symbolic dysfunctions: Secondary | ICD-10-CM | POA: Diagnosis not present

## 2020-10-20 DIAGNOSIS — I10 Essential (primary) hypertension: Secondary | ICD-10-CM | POA: Diagnosis not present

## 2020-10-20 DIAGNOSIS — R410 Disorientation, unspecified: Secondary | ICD-10-CM | POA: Diagnosis not present

## 2020-10-20 DIAGNOSIS — K7460 Unspecified cirrhosis of liver: Secondary | ICD-10-CM | POA: Diagnosis not present

## 2020-10-20 DIAGNOSIS — E1122 Type 2 diabetes mellitus with diabetic chronic kidney disease: Secondary | ICD-10-CM | POA: Insufficient documentation

## 2020-10-20 DIAGNOSIS — Z79899 Other long term (current) drug therapy: Secondary | ICD-10-CM | POA: Diagnosis not present

## 2020-10-20 DIAGNOSIS — E1169 Type 2 diabetes mellitus with other specified complication: Secondary | ICD-10-CM | POA: Diagnosis present

## 2020-10-20 DIAGNOSIS — M79605 Pain in left leg: Secondary | ICD-10-CM | POA: Diagnosis not present

## 2020-10-20 DIAGNOSIS — Z7901 Long term (current) use of anticoagulants: Secondary | ICD-10-CM | POA: Diagnosis not present

## 2020-10-20 DIAGNOSIS — D510 Vitamin B12 deficiency anemia due to intrinsic factor deficiency: Secondary | ICD-10-CM | POA: Diagnosis not present

## 2020-10-20 DIAGNOSIS — Z87891 Personal history of nicotine dependence: Secondary | ICD-10-CM | POA: Insufficient documentation

## 2020-10-20 DIAGNOSIS — S0990XA Unspecified injury of head, initial encounter: Secondary | ICD-10-CM | POA: Insufficient documentation

## 2020-10-20 DIAGNOSIS — R279 Unspecified lack of coordination: Secondary | ICD-10-CM | POA: Diagnosis not present

## 2020-10-20 DIAGNOSIS — R4182 Altered mental status, unspecified: Secondary | ICD-10-CM | POA: Insufficient documentation

## 2020-10-20 DIAGNOSIS — W19XXXA Unspecified fall, initial encounter: Secondary | ICD-10-CM | POA: Insufficient documentation

## 2020-10-20 DIAGNOSIS — R52 Pain, unspecified: Secondary | ICD-10-CM | POA: Diagnosis not present

## 2020-10-20 DIAGNOSIS — N184 Chronic kidney disease, stage 4 (severe): Secondary | ICD-10-CM | POA: Diagnosis not present

## 2020-10-20 DIAGNOSIS — R1312 Dysphagia, oropharyngeal phase: Secondary | ICD-10-CM | POA: Diagnosis not present

## 2020-10-20 DIAGNOSIS — M25552 Pain in left hip: Secondary | ICD-10-CM | POA: Diagnosis not present

## 2020-10-20 DIAGNOSIS — M1712 Unilateral primary osteoarthritis, left knee: Secondary | ICD-10-CM | POA: Diagnosis not present

## 2020-10-20 DIAGNOSIS — A419 Sepsis, unspecified organism: Secondary | ICD-10-CM | POA: Diagnosis not present

## 2020-10-20 DIAGNOSIS — R262 Difficulty in walking, not elsewhere classified: Secondary | ICD-10-CM | POA: Diagnosis not present

## 2020-10-20 DIAGNOSIS — R2681 Unsteadiness on feet: Secondary | ICD-10-CM | POA: Diagnosis not present

## 2020-10-20 DIAGNOSIS — G9341 Metabolic encephalopathy: Secondary | ICD-10-CM | POA: Diagnosis not present

## 2020-10-20 DIAGNOSIS — M830 Puerperal osteomalacia: Secondary | ICD-10-CM | POA: Diagnosis not present

## 2020-10-20 DIAGNOSIS — Z89422 Acquired absence of other left toe(s): Secondary | ICD-10-CM | POA: Diagnosis not present

## 2020-10-20 DIAGNOSIS — M6281 Muscle weakness (generalized): Secondary | ICD-10-CM | POA: Diagnosis not present

## 2020-10-20 DIAGNOSIS — M25562 Pain in left knee: Secondary | ICD-10-CM | POA: Diagnosis not present

## 2020-10-20 DIAGNOSIS — Z743 Need for continuous supervision: Secondary | ICD-10-CM | POA: Diagnosis not present

## 2020-10-20 DIAGNOSIS — I129 Hypertensive chronic kidney disease with stage 1 through stage 4 chronic kidney disease, or unspecified chronic kidney disease: Secondary | ICD-10-CM | POA: Diagnosis not present

## 2020-10-20 DIAGNOSIS — R498 Other voice and resonance disorders: Secondary | ICD-10-CM | POA: Diagnosis not present

## 2020-10-20 DIAGNOSIS — G9389 Other specified disorders of brain: Secondary | ICD-10-CM | POA: Diagnosis not present

## 2020-10-20 DIAGNOSIS — M79601 Pain in right arm: Secondary | ICD-10-CM | POA: Diagnosis not present

## 2020-10-20 NOTE — ED Notes (Signed)
Patient is resting comfortably. 

## 2020-10-20 NOTE — ED Notes (Signed)
Called [facility] for transport to [place]

## 2020-10-20 NOTE — ED Provider Notes (Signed)
[facility] Emergency Department Provider Note  ____________________________________________  Time seen: Approximately 5:56 PM  I have reviewed the triage vital signs and the nursing notes.   HISTORY  Chief Complaint Fall and Altered Mental Status    Level 5 Caveat: Portions of the History and Physical including HPI and review of systems are unable to be completely obtained due to patient being a poor historian   HPI Yesenia Shepard is a 70 y.o. female with a history of diabetes hypertension depression who was sent to the ED from her SNF due to a fall 2 days ago with head injury.  Reportedly her mental status is off from her baseline, increased confusion for the past 2 days.  Also complains of left knee pain.  Pain is constant, worse with movement, no alleviating factors, nonradiating.  Has a headache as well.  No vision changes or lateralizing weakness or paresthesia.   Patient reports that she is wheelchair-bound at baseline.  Facility physician requested patient be sent to the ED today for evaluation.  Past Medical History:  Diagnosis Date  . Anxiety   . Arthritis   . Chronic pain   . Depression   . Diabetes mellitus without complication ([place])   . Drug abuse ([place])    History of polysubstance abuse; currently on methadone.  . Hepatitis    History of Hep "C". treated and cured with Harvoni  . History of chicken pox   . Hypertension   . Panic attacks   . Peripheral vascular disease ([place])    stent in place.   Marland Kitchen UTI (urinary tract infection)    History of     Patient Active Problem List   Diagnosis Date Noted  . Acquired absence of other left toe(s) ([place]) 10/20/2020  . Decubitus ulcer, infected 01/28/2019  . Pressure injury of skin 12/24/2018  . Sepsis ([place]) 12/22/2018  . CKD (chronic kidney disease), stage IV ([place]) 12/22/2018  . UTI (urinary tract infection) 12/22/2018  . Elevated CK 12/22/2018  . Atherosclerotic peripheral vascular disease with  ulceration ([place]) 10/24/2018  . Atherosclerosis of artery of extremity with ulceration ([place]) 10/24/2018  . Atherosclerosis of native arteries of extremity with rest pain ([place]) 10/01/2018  . Chronic midline low back pain with bilateral sciatica 04/23/2018  . Vitamin D deficiency 04/10/2018  . Swelling of limb 03/01/2018  . Atherosclerosis of native arteries of extremity with intermittent claudication ([place]) 01/03/2018  . Leg pain 12/12/2017  . S/P right rotator cuff repair 01/16/2017  . Preoperative examination 01/14/2017  . Full thickness rotator cuff tear 12/24/2016  . Right shoulder pain 09/06/2016  . Hepatic cirrhosis ([place]) 02/09/2016  . Chronic hepatitis C without hepatic coma ([place]) 11/09/2015  . HLD (hyperlipidemia) 08/20/2015  . Peripheral vascular disease ([place]) 08/12/2015  . Essential hypertension 08/12/2015  . Type 2 diabetes mellitus with other specified complication, unspecified whether long term insulin use ([place]) 04/19/2015  . Current smoker 03/15/2015  . History of recreational drug use 03/15/2015  . Osteoarthritis 03/15/2015  . Preventative health care 03/15/2015  . Ulcer of left lower leg, limited to breakdown of skin ([place]) 03/15/2015  . Anxiety and depression 08/25/2012     Past Surgical History:  Procedure Laterality Date  . ABDOMINAL HYSTERECTOMY  1989  . AMPUTATION TOE Left 10/26/2018   Procedure: 5TH RAY RESCTION;  Surgeon: Albertine Patricia, DPM;  Location: [facility] ORS;  Service: Podiatry;  Laterality: Left;  . APPLICATION OF WOUND VAC N/A 02/05/2019   Procedure: APPLICATION OF WOUND  VAC POSS DEBRIDEMENT;  Surgeon: Fredirick Maudlin, MD;  Location: [facility] ORS;  Service: General;  Laterality: N/A;  . APPLICATION OF WOUND VAC  02/03/2019   Procedure: APPLICATION OF WOUND VAC;  Surgeon: Fredirick Maudlin, MD;  Location: [facility] ORS;  Service: General;;  . CHOLECYSTECTOMY  1987  . INTRACAPSULAR CATARACT EXTRACTION Left   . IRRIGATION AND DEBRIDEMENT ABSCESS N/A 02/03/2019    Procedure: IRRIGATION AND DEBRIDEMENT SACRAL DECUBITUS;  Surgeon: Fredirick Maudlin, MD;  Location: [facility] ORS;  Service: General;  Laterality: N/A;  . LOWER EXTREMITY ANGIOGRAPHY Left 12/17/2017   Procedure: LOWER EXTREMITY ANGIOGRAPHY;  Surgeon: Katha Cabal, MD;  Location: [place] [facility];  Service: Cardiovascular;  Laterality: Left;  . LOWER EXTREMITY ANGIOGRAPHY Left 10/24/2018   Procedure: LOWER EXTREMITY ANGIOGRAPHY;  Surgeon: Algernon Huxley, MD;  Location: [place] [facility];  Service: Cardiovascular;  Laterality: Left;  . PERIPHERAL VASCULAR CATHETERIZATION Left 05/04/2015   Procedure: Lower Extremity Angiography;  Surgeon: Katha Cabal, MD;  Location: [place] [facility];  Service: Cardiovascular;  Laterality: Left;  . PERIPHERAL VASCULAR CATHETERIZATION Left 05/04/2015   Procedure: Lower Extremity Intervention;  Surgeon: Katha Cabal, MD;  Location: [place] [facility];  Service: Cardiovascular;  Laterality: Left;  . SHOULDER ARTHROSCOPY WITH OPEN ROTATOR CUFF REPAIR Right 01/16/2017   Procedure: SHOULDER ARTHROSCOPY WITH OPEN ROTATOR CUFF REPAIR;  Surgeon: Thornton Park, MD;  Location: [facility] OR[facility];  Service: Orthopedics;  Laterality: Right;  . TONSILLECTOMY AND ADENOIDECTOMY  1971     Prior to Admission medications   Medication Sig Start Date End Date Taking? Authorizing Provider  acetaminophen (TYLENOL) 325 MG tablet Take 2 tablets (650 mg total) by mouth every 6 (six) hours as needed for mild pain (or Fever >/= 101). 11/03/18   Stegmayer, Joelene Millin A, PA-C  atorvastatin (LIPITOR) 40 MG tablet Take 1 tablet (40 mg total) by mouth daily. 04/10/18   [name], [name], MD  bisacodyl (DULCOLAX) 5 MG EC tablet Take 1 tablet (5 mg total) by mouth daily as needed for moderate constipation. 02/11/19   Stark Jock Jude, MD  cholecalciferol (VITAMIN D3) 25 MCG (1000 UT) tablet Take 1 tablet (1,000 Units total) by mouth daily. 11/04/18   Stegmayer, Joelene Millin A, PA-C  clonazepam  (KLONOPIN) 0.125 MG disintegrating tablet Take 0.125 mg by mouth 2 (two) times daily.    [provider]  clopidogrel (PLAVIX) 75 MG tablet TAKE 1 TABLET BY MOUTH ONCE DAILY 12/23/17   Schnier, Dolores Lory, MD  divalproex (DEPAKOTE SPRINKLE) 125 MG capsule  09/11/19   [provider]  docusate sodium (COLACE) 100 MG capsule Take 1 capsule (100 mg total) by mouth 2 (two) times daily. 02/11/19   Otila Back, MD  ELIQUIS 5 MG TABS tablet  03/27/19   [provider]  enoxaparin (LOVENOX) 60 MG/0.6ML injection  03/06/19   [provider]  escitalopram (LEXAPRO) 5 MG tablet Take 1 tablet (5 mg total) by mouth daily. 11/04/18   Stegmayer, Joelene Millin A, PA-C  famotidine (PEPCID) 20 MG tablet Take 1 tablet (20 mg total) by mouth daily. 02/12/19   Stark Jock Jude, MD  feeding supplement, GLUCERNA SHAKE, (GLUCERNA SHAKE) LIQD Take 237 mLs by mouth 2 (two) times daily between meals. 11/03/18   Stegmayer, Joelene Millin A, PA-C  ferrous sulfate 325 (65 FE) MG tablet Take 325 mg by mouth 2 (two) times daily.     [provider]  HYDROcodone-acetaminophen (NORCO/VICODIN) 5-325 MG tablet Take 1 tablet by mouth every 6 (six) hours as  needed for moderate pain. 02/11/19   Otila Back, MD  KLOR-CON M10 10 MEQ tablet  07/27/19   [provider]  lisinopril (PRINIVIL,ZESTRIL) 10 MG tablet Take 1 tablet (10 mg total) by mouth daily. 04/23/18   [name], [name], MD  lisinopril (ZESTRIL) 20 MG tablet  04/12/19   [provider]  Melatonin 5 MG TABS Take 5 mg by mouth at bedtime.    [provider]  methadone (DOLOPHINE) 10 MG/5ML solution Take 5 mg by mouth every 6 (six) hours as needed for pain. Give 65 ml oral    Juluis Pitch, MD  Multiple Vitamins-Iron (MULTIVITAMINS WITH IRON) TABS tablet Take 1 tablet by mouth daily. 11/04/18   Stegmayer, Janalyn Harder, PA-C  Polyethylene Glycol 3350 (MIRALAX PO) Take by mouth.    [provider]  potassium chloride (K-DUR) 10  MEQ tablet Take 10 mEq by mouth daily.    [provider]  sulfamethoxazole-trimethoprim (BACTRIM DS) 800-160 MG tablet  08/21/19   [provider]  traZODone (DESYREL) 150 MG tablet  09/08/19   [provider]  traZODone (DESYREL) 50 MG tablet Take 50 mg by mouth at bedtime.    [provider]  vitamin C (VITAMIN C) 250 MG tablet Take 1 tablet (250 mg total) by mouth 2 (two) times daily. 11/03/18   Stegmayer, Janalyn Harder, PA-C     Allergies Patient has no known allergies.   Family History  Problem Relation Age of Onset  . Arthritis Mother   . Mental illness Mother        depression  . Diabetes Mother   . Cancer Mother        pancreatic  . Cancer Father        colon  . Heart disease Father   . Diabetes Father   . Cancer Daughter        lung    Social History Social History   Tobacco Use  . Smoking status: Former Smoker    Packs/day: 0.50    Years: 30.00    Pack years: 15.00    Types: Cigarettes    Quit date: 07/25/2018    Years since quitting: 2.2  . Smokeless tobacco: Never Used  Vaping Use  . Vaping Use: Never used  Substance Use Topics  . Alcohol use: No    Alcohol/week: 0.0 standard drinks  . Drug use: Not Currently    Comment: Hx of.    Review of Systems Level 5 Caveat: Portions of the History and Physical including HPI and review of systems are unable to be completely obtained due to patient being a poor historian   Constitutional:   No known fever.  ENT:   No rhinorrhea. Cardiovascular:   No chest pain or syncope. Respiratory:   No dyspnea or cough. Gastrointestinal:   Negative for abdominal pain, vomiting and diarrhea.  Musculoskeletal:   Left knee pain as above ____________________________________________   PHYSICAL EXAM:  VITAL SIGNS: ED Triage Vitals  Enc Vitals Group     BP 10/20/20 1556 (!) 116/40     Pulse Rate 10/20/20 1556 64     Resp 10/20/20 1556 16     Temp 10/20/20 1556 98 F (36.7 C)     Temp  Source 10/20/20 1556 Oral     SpO2 10/20/20 1556 96 %     Weight 10/20/20 1600 154 lb 5.2 oz (70 kg)     Height 10/20/20 1600 '5\' 6"'$  (1.676 m)     Head  Circumference --      [facility] --      Pain Score 10/20/20 1600 5     Pain Loc --      Pain Edu? --      Excl. in [place]? --     Vital signs reviewed, nursing assessments reviewed.   Constitutional:   Alert and oriented to person and place. Non-toxic appearance. Eyes:   Conjunctivae are normal. EOMI. PERRL. ENT      Head:   Normocephalic and atraumatic.      Nose:   No congestion/rhinnorhea.       Mouth/Throat:   MMM, no pharyngeal erythema. No peritonsillar mass.       Neck:   No meningismus. Full ROM.  No midline tenderness Hematological/Lymphatic/Immunilogical:   No cervical lymphadenopathy. Cardiovascular:   RRR. Symmetric bilateral radial and DP pulses.  No murmurs. Cap refill less than 2 seconds. Respiratory:   Normal respiratory effort without tachypnea/retractions. Breath sounds are clear and equal bilaterally. No wheezes/rales/rhonchi. Gastrointestinal:   Soft and nontender. Non distended. There is no CVA tenderness.  No rebound, rigidity, or guarding. Genitourinary:   deferred Musculoskeletal:   Normal range of motion in all extremities.  There is tenderness at the left hip and left proximal tibia.  No open wounds deformities or crepitus. Neurologic:   Normal speech and language.  Motor grossly intact. No acute focal neurologic deficits are appreciated.  Skin:    Skin is warm, dry and intact. No rash noted.  No petechiae, purpura, or bullae.  ____________________________________________    LABS (pertinent positives/negatives) (all labs ordered are listed, but only abnormal results are displayed) Labs Reviewed - No data to display ____________________________________________   EKG    ____________________________________________    RADIOLOGY  DG Tibia/Fibula Left  Result Date: 10/20/2020 CLINICAL DATA:  Fall 2  days ago with left lower leg pain, initial encounter EXAM: LEFT TIBIA AND FIBULA - 2 VIEW COMPARISON:  None. FINDINGS: Generalized osteopenia is noted. No acute fracture or dislocation is noted. Degenerative changes of the knee and ankle joints are seen. No soft tissue abnormality is noted. IMPRESSION: Degenerative change without acute abnormality. Electronically Signed   By: Inez Catalina M.D.   On: 10/20/2020 16:48   CT Head Wo Contrast  Result Date: 10/20/2020 CLINICAL DATA:  Trauma.  Asking repetitive questions. EXAM: CT HEAD WITHOUT CONTRAST TECHNIQUE: Contiguous axial images were obtained from the base of the skull through the vertex without intravenous contrast. COMPARISON:  CT head 12/22/2018 FINDINGS: Brain: Cerebral ventricle sizes are concordant with the degree of cerebral volume loss. No evidence of large-territorial acute infarction. No parenchymal hemorrhage. No mass lesion. No extra-axial collection. No mass effect or midline shift. No hydrocephalus. Basilar cisterns are patent. Vascular: No hyperdense vessel. Skull: No acute fracture or focal lesion. Sinuses/Orbits: Paranasal sinuses and mastoid air cells are clear. Left lens replacement. Otherwise orbits are unremarkable. Other: None. IMPRESSION: No acute intracranial abnormality. Electronically Signed   By: Iven Finn M.D.   On: 10/20/2020 16:20   DG Hip Unilat W or Wo Pelvis 2-3 Views Left  Result Date: 10/20/2020 CLINICAL DATA:  Left hip pain and left proximal tibial pain. Post fall 2 days ago. EXAM: DG HIP (WITH OR WITHOUT PELVIS) 2-3V LEFT COMPARISON:  None. FINDINGS: There is no evidence of hip fracture or dislocation. Mild osteoarthritic changes of the right hip. Vascular stents noted. IMPRESSION: No acute fracture or dislocation identified about the left hip. Electronically Signed   By: Thomas Hoff  Dimitrova M.D.   On: 10/20/2020 16:50     ____________________________________________   PROCEDURES Procedures  ____________________________________________  DIFFERENTIAL DIAGNOSIS   Intracranial hemorrhage, hip fracture, tibia fracture  CLINICAL IMPRESSION / ASSESSMENT AND PLAN / ED COURSE  Medications ordered in the ED: Medications - No data to display  Pertinent labs & imaging results that were available during my care of the patient were reviewed by me and considered in my medical decision making (see chart for details).   Yesenia Shepard was evaluated in Emergency Department on 10/20/2020 for the symptoms described in the history of present illness. She was evaluated in the context of the global COVID-19 pandemic, which necessitated consideration that the patient might be at risk for infection with the SARS-CoV-2 virus that causes COVID-19. Institutional protocols and algorithms that pertain to the evaluation of patients at risk for COVID-19 are in a state of rapid change based on information released by regulatory bodies including the CDC and federal and state organizations. These policies and algorithms were followed during the patient's care in the ED.   Patient presents with fall 2 days ago.  No neck pain or tenderness, and 2 days out she is unlikely to have a significant spinal injury.  With her Eliquis use, CT scan of the head obtained.  X-rays of the left leg due to pain.  Clinical Course as of 10/20/20 1756  Thu Oct 20, 2020  1630 CT head unremarkable [PS]    Clinical Course User Index [PS] Carrie Mew, MD     ----------------------------------------- 5:58 PM on 10/20/2020 -----------------------------------------  X-ray of the left tibia and left hip are unremarkable.  Stable for discharge home to continue following up with PCP.  ____________________________________________   FINAL CLINICAL IMPRESSION(S) / ED DIAGNOSES    Final diagnoses:  Acute pain of left knee  Acquired absence of other  left toe(s) (HCC)  Cirrhosis of liver without ascites, unspecified hepatic cirrhosis type ([place])  CKD (chronic kidney disease), stage IV ([place])  Type 2 diabetes mellitus with other specified complication, unspecified whether long term insulin use [facility])     ED Discharge Orders    None      Portions of this note were generated with dragon dictation software. Dictation errors may occur despite best attempts at proofreading.   Carrie Mew, MD 10/20/20 1758

## 2020-10-20 NOTE — ED Notes (Signed)
Transport requested at this time. Report provided to staff at [facility] at this time.

## 2020-10-20 NOTE — ED Notes (Signed)
Patient transported back to [facility], with transport. Discharge instructions printout provided to transport, patient aware.

## 2020-10-20 NOTE — ED Notes (Signed)
Meal tray provided to patient. Bedside swallow eval performed and passed.

## 2020-10-20 NOTE — ED Notes (Signed)
ED Provider at bedside. 

## 2020-10-20 NOTE — ED Notes (Signed)
Warm blanket provided.

## 2020-10-20 NOTE — ED Triage Notes (Signed)
Patient arrives via [facility] with complaints of left knee pain, and AMS since falling Tuesday. Patient is reported to be on blood thinner.

## 2020-10-20 NOTE — ED Notes (Signed)
Report provided to [place], provider at [facility].

## 2020-10-20 NOTE — ED Notes (Signed)
Patient updated on POC. Patient reading book in hall bed.

## 2020-10-20 NOTE — ED Notes (Signed)
Patient reports she is normally mobile via wheelchair at her SNF.

## 2020-10-20 NOTE — ED Notes (Signed)
Patient transported to CT 

## 2020-10-20 NOTE — Discharge Instructions (Addendum)
Your CT scan of the head, x-ray of the left lower leg, and x-ray of the left hip were all okay.  Please follow-up with your primary care doctor and take all your medications as usual.

## 2020-10-20 NOTE — ED Notes (Signed)
Patient is alert, oriented x4. Patient does ask repetitive questions. Patient reports a fall at her SNF on Tuesday of this week. Patient also reports some altered mental status since Tuesday, but is unable to qualify what she has been confused about. Patient reports pain to the L knee since falling Tuesday.

## 2020-10-21 DIAGNOSIS — M79601 Pain in right arm: Secondary | ICD-10-CM | POA: Diagnosis not present

## 2020-10-21 DIAGNOSIS — M79605 Pain in left leg: Secondary | ICD-10-CM | POA: Diagnosis not present

## 2020-10-21 DIAGNOSIS — G9341 Metabolic encephalopathy: Secondary | ICD-10-CM | POA: Diagnosis not present

## 2020-10-21 DIAGNOSIS — W19XXXA Unspecified fall, initial encounter: Secondary | ICD-10-CM | POA: Diagnosis not present

## 2020-10-24 DIAGNOSIS — R1312 Dysphagia, oropharyngeal phase: Secondary | ICD-10-CM | POA: Diagnosis not present

## 2020-10-24 DIAGNOSIS — A419 Sepsis, unspecified organism: Secondary | ICD-10-CM | POA: Diagnosis not present

## 2020-10-24 DIAGNOSIS — R262 Difficulty in walking, not elsewhere classified: Secondary | ICD-10-CM | POA: Diagnosis not present

## 2020-10-24 DIAGNOSIS — R2681 Unsteadiness on feet: Secondary | ICD-10-CM | POA: Diagnosis not present

## 2020-10-24 DIAGNOSIS — R498 Other voice and resonance disorders: Secondary | ICD-10-CM | POA: Diagnosis not present

## 2020-10-24 DIAGNOSIS — R488 Other symbolic dysfunctions: Secondary | ICD-10-CM | POA: Diagnosis not present

## 2020-10-24 DIAGNOSIS — R52 Pain, unspecified: Secondary | ICD-10-CM | POA: Diagnosis not present

## 2020-10-25 DIAGNOSIS — A419 Sepsis, unspecified organism: Secondary | ICD-10-CM | POA: Diagnosis not present

## 2020-10-25 DIAGNOSIS — L602 Onychogryphosis: Secondary | ICD-10-CM | POA: Diagnosis not present

## 2020-10-25 DIAGNOSIS — R2681 Unsteadiness on feet: Secondary | ICD-10-CM | POA: Diagnosis not present

## 2020-10-25 DIAGNOSIS — R488 Other symbolic dysfunctions: Secondary | ICD-10-CM | POA: Diagnosis not present

## 2020-10-25 DIAGNOSIS — R1312 Dysphagia, oropharyngeal phase: Secondary | ICD-10-CM | POA: Diagnosis not present

## 2020-10-25 DIAGNOSIS — F064 Anxiety disorder due to known physiological condition: Secondary | ICD-10-CM | POA: Diagnosis not present

## 2020-10-25 DIAGNOSIS — R52 Pain, unspecified: Secondary | ICD-10-CM | POA: Diagnosis not present

## 2020-10-25 DIAGNOSIS — F1194 Opioid use, unspecified with opioid-induced mood disorder: Secondary | ICD-10-CM | POA: Diagnosis not present

## 2020-10-25 DIAGNOSIS — F331 Major depressive disorder, recurrent, moderate: Secondary | ICD-10-CM | POA: Diagnosis not present

## 2020-10-25 DIAGNOSIS — R498 Other voice and resonance disorders: Secondary | ICD-10-CM | POA: Diagnosis not present

## 2020-10-25 DIAGNOSIS — R262 Difficulty in walking, not elsewhere classified: Secondary | ICD-10-CM | POA: Diagnosis not present

## 2020-10-25 DIAGNOSIS — M21611 Bunion of right foot: Secondary | ICD-10-CM | POA: Diagnosis not present

## 2020-10-25 DIAGNOSIS — I739 Peripheral vascular disease, unspecified: Secondary | ICD-10-CM | POA: Diagnosis not present

## 2020-10-27 DIAGNOSIS — R2681 Unsteadiness on feet: Secondary | ICD-10-CM | POA: Diagnosis not present

## 2020-10-27 DIAGNOSIS — R1312 Dysphagia, oropharyngeal phase: Secondary | ICD-10-CM | POA: Diagnosis not present

## 2020-10-27 DIAGNOSIS — A419 Sepsis, unspecified organism: Secondary | ICD-10-CM | POA: Diagnosis not present

## 2020-10-27 DIAGNOSIS — R498 Other voice and resonance disorders: Secondary | ICD-10-CM | POA: Diagnosis not present

## 2020-10-27 DIAGNOSIS — R262 Difficulty in walking, not elsewhere classified: Secondary | ICD-10-CM | POA: Diagnosis not present

## 2020-10-27 DIAGNOSIS — R488 Other symbolic dysfunctions: Secondary | ICD-10-CM | POA: Diagnosis not present

## 2020-10-27 DIAGNOSIS — R52 Pain, unspecified: Secondary | ICD-10-CM | POA: Diagnosis not present

## 2020-10-28 DIAGNOSIS — R52 Pain, unspecified: Secondary | ICD-10-CM | POA: Diagnosis not present

## 2020-10-28 DIAGNOSIS — A419 Sepsis, unspecified organism: Secondary | ICD-10-CM | POA: Diagnosis not present

## 2020-10-28 DIAGNOSIS — R1312 Dysphagia, oropharyngeal phase: Secondary | ICD-10-CM | POA: Diagnosis not present

## 2020-10-28 DIAGNOSIS — R262 Difficulty in walking, not elsewhere classified: Secondary | ICD-10-CM | POA: Diagnosis not present

## 2020-10-28 DIAGNOSIS — R488 Other symbolic dysfunctions: Secondary | ICD-10-CM | POA: Diagnosis not present

## 2020-10-28 DIAGNOSIS — R498 Other voice and resonance disorders: Secondary | ICD-10-CM | POA: Diagnosis not present

## 2020-10-28 DIAGNOSIS — R2681 Unsteadiness on feet: Secondary | ICD-10-CM | POA: Diagnosis not present

## 2020-10-31 DIAGNOSIS — R498 Other voice and resonance disorders: Secondary | ICD-10-CM | POA: Diagnosis not present

## 2020-10-31 DIAGNOSIS — R52 Pain, unspecified: Secondary | ICD-10-CM | POA: Diagnosis not present

## 2020-10-31 DIAGNOSIS — A419 Sepsis, unspecified organism: Secondary | ICD-10-CM | POA: Diagnosis not present

## 2020-10-31 DIAGNOSIS — R488 Other symbolic dysfunctions: Secondary | ICD-10-CM | POA: Diagnosis not present

## 2020-10-31 DIAGNOSIS — R1312 Dysphagia, oropharyngeal phase: Secondary | ICD-10-CM | POA: Diagnosis not present

## 2020-10-31 DIAGNOSIS — R2681 Unsteadiness on feet: Secondary | ICD-10-CM | POA: Diagnosis not present

## 2020-10-31 DIAGNOSIS — R262 Difficulty in walking, not elsewhere classified: Secondary | ICD-10-CM | POA: Diagnosis not present

## 2020-11-01 DIAGNOSIS — R1312 Dysphagia, oropharyngeal phase: Secondary | ICD-10-CM | POA: Diagnosis not present

## 2020-11-01 DIAGNOSIS — R498 Other voice and resonance disorders: Secondary | ICD-10-CM | POA: Diagnosis not present

## 2020-11-01 DIAGNOSIS — R2681 Unsteadiness on feet: Secondary | ICD-10-CM | POA: Diagnosis not present

## 2020-11-01 DIAGNOSIS — A419 Sepsis, unspecified organism: Secondary | ICD-10-CM | POA: Diagnosis not present

## 2020-11-01 DIAGNOSIS — R52 Pain, unspecified: Secondary | ICD-10-CM | POA: Diagnosis not present

## 2020-11-01 DIAGNOSIS — R488 Other symbolic dysfunctions: Secondary | ICD-10-CM | POA: Diagnosis not present

## 2020-11-01 DIAGNOSIS — R262 Difficulty in walking, not elsewhere classified: Secondary | ICD-10-CM | POA: Diagnosis not present

## 2020-11-02 DIAGNOSIS — R262 Difficulty in walking, not elsewhere classified: Secondary | ICD-10-CM | POA: Diagnosis not present

## 2020-11-02 DIAGNOSIS — R498 Other voice and resonance disorders: Secondary | ICD-10-CM | POA: Diagnosis not present

## 2020-11-02 DIAGNOSIS — R52 Pain, unspecified: Secondary | ICD-10-CM | POA: Diagnosis not present

## 2020-11-02 DIAGNOSIS — A419 Sepsis, unspecified organism: Secondary | ICD-10-CM | POA: Diagnosis not present

## 2020-11-02 DIAGNOSIS — R2681 Unsteadiness on feet: Secondary | ICD-10-CM | POA: Diagnosis not present

## 2020-11-02 DIAGNOSIS — R1312 Dysphagia, oropharyngeal phase: Secondary | ICD-10-CM | POA: Diagnosis not present

## 2020-11-02 DIAGNOSIS — R488 Other symbolic dysfunctions: Secondary | ICD-10-CM | POA: Diagnosis not present

## 2020-11-03 DIAGNOSIS — R488 Other symbolic dysfunctions: Secondary | ICD-10-CM | POA: Diagnosis not present

## 2020-11-03 DIAGNOSIS — R52 Pain, unspecified: Secondary | ICD-10-CM | POA: Diagnosis not present

## 2020-11-03 DIAGNOSIS — R1312 Dysphagia, oropharyngeal phase: Secondary | ICD-10-CM | POA: Diagnosis not present

## 2020-11-03 DIAGNOSIS — R262 Difficulty in walking, not elsewhere classified: Secondary | ICD-10-CM | POA: Diagnosis not present

## 2020-11-03 DIAGNOSIS — R2681 Unsteadiness on feet: Secondary | ICD-10-CM | POA: Diagnosis not present

## 2020-11-03 DIAGNOSIS — A419 Sepsis, unspecified organism: Secondary | ICD-10-CM | POA: Diagnosis not present

## 2020-11-03 DIAGNOSIS — R498 Other voice and resonance disorders: Secondary | ICD-10-CM | POA: Diagnosis not present

## 2020-11-06 DIAGNOSIS — L89610 Pressure ulcer of right heel, unstageable: Secondary | ICD-10-CM | POA: Diagnosis not present

## 2020-11-06 DIAGNOSIS — L89154 Pressure ulcer of sacral region, stage 4: Secondary | ICD-10-CM | POA: Diagnosis not present

## 2020-11-06 DIAGNOSIS — L89120 Pressure ulcer of left upper back, unstageable: Secondary | ICD-10-CM | POA: Diagnosis not present

## 2020-11-06 DIAGNOSIS — R2681 Unsteadiness on feet: Secondary | ICD-10-CM | POA: Diagnosis not present

## 2020-11-06 DIAGNOSIS — A419 Sepsis, unspecified organism: Secondary | ICD-10-CM | POA: Diagnosis not present

## 2020-11-06 DIAGNOSIS — M6281 Muscle weakness (generalized): Secondary | ICD-10-CM | POA: Diagnosis not present

## 2020-11-06 DIAGNOSIS — R52 Pain, unspecified: Secondary | ICD-10-CM | POA: Diagnosis not present

## 2020-11-06 DIAGNOSIS — L89150 Pressure ulcer of sacral region, unstageable: Secondary | ICD-10-CM | POA: Diagnosis not present

## 2020-11-06 DIAGNOSIS — R262 Difficulty in walking, not elsewhere classified: Secondary | ICD-10-CM | POA: Diagnosis not present

## 2020-11-07 DIAGNOSIS — L89150 Pressure ulcer of sacral region, unstageable: Secondary | ICD-10-CM | POA: Diagnosis not present

## 2020-11-07 DIAGNOSIS — A419 Sepsis, unspecified organism: Secondary | ICD-10-CM | POA: Diagnosis not present

## 2020-11-07 DIAGNOSIS — L89610 Pressure ulcer of right heel, unstageable: Secondary | ICD-10-CM | POA: Diagnosis not present

## 2020-11-07 DIAGNOSIS — L89154 Pressure ulcer of sacral region, stage 4: Secondary | ICD-10-CM | POA: Diagnosis not present

## 2020-11-07 DIAGNOSIS — R52 Pain, unspecified: Secondary | ICD-10-CM | POA: Diagnosis not present

## 2020-11-07 DIAGNOSIS — M6281 Muscle weakness (generalized): Secondary | ICD-10-CM | POA: Diagnosis not present

## 2020-11-07 DIAGNOSIS — L89120 Pressure ulcer of left upper back, unstageable: Secondary | ICD-10-CM | POA: Diagnosis not present

## 2020-11-07 DIAGNOSIS — R2681 Unsteadiness on feet: Secondary | ICD-10-CM | POA: Diagnosis not present

## 2020-11-07 DIAGNOSIS — R262 Difficulty in walking, not elsewhere classified: Secondary | ICD-10-CM | POA: Diagnosis not present

## 2020-11-08 DIAGNOSIS — L89610 Pressure ulcer of right heel, unstageable: Secondary | ICD-10-CM | POA: Diagnosis not present

## 2020-11-08 DIAGNOSIS — L89120 Pressure ulcer of left upper back, unstageable: Secondary | ICD-10-CM | POA: Diagnosis not present

## 2020-11-08 DIAGNOSIS — M6281 Muscle weakness (generalized): Secondary | ICD-10-CM | POA: Diagnosis not present

## 2020-11-08 DIAGNOSIS — L89154 Pressure ulcer of sacral region, stage 4: Secondary | ICD-10-CM | POA: Diagnosis not present

## 2020-11-08 DIAGNOSIS — A419 Sepsis, unspecified organism: Secondary | ICD-10-CM | POA: Diagnosis not present

## 2020-11-08 DIAGNOSIS — R2681 Unsteadiness on feet: Secondary | ICD-10-CM | POA: Diagnosis not present

## 2020-11-08 DIAGNOSIS — R52 Pain, unspecified: Secondary | ICD-10-CM | POA: Diagnosis not present

## 2020-11-08 DIAGNOSIS — R262 Difficulty in walking, not elsewhere classified: Secondary | ICD-10-CM | POA: Diagnosis not present

## 2020-11-08 DIAGNOSIS — L89150 Pressure ulcer of sacral region, unstageable: Secondary | ICD-10-CM | POA: Diagnosis not present

## 2020-11-09 DIAGNOSIS — F331 Major depressive disorder, recurrent, moderate: Secondary | ICD-10-CM | POA: Diagnosis not present

## 2020-11-09 DIAGNOSIS — I10 Essential (primary) hypertension: Secondary | ICD-10-CM | POA: Diagnosis not present

## 2020-11-09 DIAGNOSIS — E785 Hyperlipidemia, unspecified: Secondary | ICD-10-CM | POA: Diagnosis not present

## 2020-11-09 DIAGNOSIS — L89610 Pressure ulcer of right heel, unstageable: Secondary | ICD-10-CM | POA: Diagnosis not present

## 2020-11-09 DIAGNOSIS — R52 Pain, unspecified: Secondary | ICD-10-CM | POA: Diagnosis not present

## 2020-11-09 DIAGNOSIS — R2681 Unsteadiness on feet: Secondary | ICD-10-CM | POA: Diagnosis not present

## 2020-11-09 DIAGNOSIS — I82431 Acute embolism and thrombosis of right popliteal vein: Secondary | ICD-10-CM | POA: Diagnosis not present

## 2020-11-09 DIAGNOSIS — R262 Difficulty in walking, not elsewhere classified: Secondary | ICD-10-CM | POA: Diagnosis not present

## 2020-11-09 DIAGNOSIS — G8929 Other chronic pain: Secondary | ICD-10-CM | POA: Diagnosis not present

## 2020-11-09 DIAGNOSIS — A419 Sepsis, unspecified organism: Secondary | ICD-10-CM | POA: Diagnosis not present

## 2020-11-09 DIAGNOSIS — L89120 Pressure ulcer of left upper back, unstageable: Secondary | ICD-10-CM | POA: Diagnosis not present

## 2020-11-09 DIAGNOSIS — N1831 Chronic kidney disease, stage 3a: Secondary | ICD-10-CM | POA: Diagnosis not present

## 2020-11-09 DIAGNOSIS — L89154 Pressure ulcer of sacral region, stage 4: Secondary | ICD-10-CM | POA: Diagnosis not present

## 2020-11-09 DIAGNOSIS — L89150 Pressure ulcer of sacral region, unstageable: Secondary | ICD-10-CM | POA: Diagnosis not present

## 2020-11-09 DIAGNOSIS — I739 Peripheral vascular disease, unspecified: Secondary | ICD-10-CM | POA: Diagnosis not present

## 2020-11-09 DIAGNOSIS — M6281 Muscle weakness (generalized): Secondary | ICD-10-CM | POA: Diagnosis not present

## 2020-11-10 DIAGNOSIS — R52 Pain, unspecified: Secondary | ICD-10-CM | POA: Diagnosis not present

## 2020-11-10 DIAGNOSIS — A419 Sepsis, unspecified organism: Secondary | ICD-10-CM | POA: Diagnosis not present

## 2020-11-10 DIAGNOSIS — L89150 Pressure ulcer of sacral region, unstageable: Secondary | ICD-10-CM | POA: Diagnosis not present

## 2020-11-10 DIAGNOSIS — L89610 Pressure ulcer of right heel, unstageable: Secondary | ICD-10-CM | POA: Diagnosis not present

## 2020-11-10 DIAGNOSIS — R262 Difficulty in walking, not elsewhere classified: Secondary | ICD-10-CM | POA: Diagnosis not present

## 2020-11-10 DIAGNOSIS — L89120 Pressure ulcer of left upper back, unstageable: Secondary | ICD-10-CM | POA: Diagnosis not present

## 2020-11-10 DIAGNOSIS — L89154 Pressure ulcer of sacral region, stage 4: Secondary | ICD-10-CM | POA: Diagnosis not present

## 2020-11-10 DIAGNOSIS — R2681 Unsteadiness on feet: Secondary | ICD-10-CM | POA: Diagnosis not present

## 2020-11-10 DIAGNOSIS — M6281 Muscle weakness (generalized): Secondary | ICD-10-CM | POA: Diagnosis not present

## 2020-11-11 DIAGNOSIS — L89150 Pressure ulcer of sacral region, unstageable: Secondary | ICD-10-CM | POA: Diagnosis not present

## 2020-11-11 DIAGNOSIS — M6281 Muscle weakness (generalized): Secondary | ICD-10-CM | POA: Diagnosis not present

## 2020-11-11 DIAGNOSIS — L89120 Pressure ulcer of left upper back, unstageable: Secondary | ICD-10-CM | POA: Diagnosis not present

## 2020-11-11 DIAGNOSIS — L89154 Pressure ulcer of sacral region, stage 4: Secondary | ICD-10-CM | POA: Diagnosis not present

## 2020-11-11 DIAGNOSIS — R52 Pain, unspecified: Secondary | ICD-10-CM | POA: Diagnosis not present

## 2020-11-11 DIAGNOSIS — R262 Difficulty in walking, not elsewhere classified: Secondary | ICD-10-CM | POA: Diagnosis not present

## 2020-11-11 DIAGNOSIS — A419 Sepsis, unspecified organism: Secondary | ICD-10-CM | POA: Diagnosis not present

## 2020-11-11 DIAGNOSIS — L89610 Pressure ulcer of right heel, unstageable: Secondary | ICD-10-CM | POA: Diagnosis not present

## 2020-11-11 DIAGNOSIS — R2681 Unsteadiness on feet: Secondary | ICD-10-CM | POA: Diagnosis not present

## 2020-11-14 DIAGNOSIS — I10 Essential (primary) hypertension: Secondary | ICD-10-CM | POA: Diagnosis not present

## 2020-11-14 DIAGNOSIS — M6281 Muscle weakness (generalized): Secondary | ICD-10-CM | POA: Diagnosis not present

## 2020-11-14 DIAGNOSIS — L89150 Pressure ulcer of sacral region, unstageable: Secondary | ICD-10-CM | POA: Diagnosis not present

## 2020-11-14 DIAGNOSIS — R262 Difficulty in walking, not elsewhere classified: Secondary | ICD-10-CM | POA: Diagnosis not present

## 2020-11-14 DIAGNOSIS — L89154 Pressure ulcer of sacral region, stage 4: Secondary | ICD-10-CM | POA: Diagnosis not present

## 2020-11-14 DIAGNOSIS — L89610 Pressure ulcer of right heel, unstageable: Secondary | ICD-10-CM | POA: Diagnosis not present

## 2020-11-14 DIAGNOSIS — A419 Sepsis, unspecified organism: Secondary | ICD-10-CM | POA: Diagnosis not present

## 2020-11-14 DIAGNOSIS — L89120 Pressure ulcer of left upper back, unstageable: Secondary | ICD-10-CM | POA: Diagnosis not present

## 2020-11-14 DIAGNOSIS — R2681 Unsteadiness on feet: Secondary | ICD-10-CM | POA: Diagnosis not present

## 2020-11-14 DIAGNOSIS — R52 Pain, unspecified: Secondary | ICD-10-CM | POA: Diagnosis not present

## 2020-11-15 DIAGNOSIS — R262 Difficulty in walking, not elsewhere classified: Secondary | ICD-10-CM | POA: Diagnosis not present

## 2020-11-15 DIAGNOSIS — L89120 Pressure ulcer of left upper back, unstageable: Secondary | ICD-10-CM | POA: Diagnosis not present

## 2020-11-15 DIAGNOSIS — R2681 Unsteadiness on feet: Secondary | ICD-10-CM | POA: Diagnosis not present

## 2020-11-15 DIAGNOSIS — A419 Sepsis, unspecified organism: Secondary | ICD-10-CM | POA: Diagnosis not present

## 2020-11-15 DIAGNOSIS — L89610 Pressure ulcer of right heel, unstageable: Secondary | ICD-10-CM | POA: Diagnosis not present

## 2020-11-15 DIAGNOSIS — R52 Pain, unspecified: Secondary | ICD-10-CM | POA: Diagnosis not present

## 2020-11-15 DIAGNOSIS — L89154 Pressure ulcer of sacral region, stage 4: Secondary | ICD-10-CM | POA: Diagnosis not present

## 2020-11-15 DIAGNOSIS — L89150 Pressure ulcer of sacral region, unstageable: Secondary | ICD-10-CM | POA: Diagnosis not present

## 2020-11-15 DIAGNOSIS — M6281 Muscle weakness (generalized): Secondary | ICD-10-CM | POA: Diagnosis not present

## 2020-11-16 DIAGNOSIS — R2681 Unsteadiness on feet: Secondary | ICD-10-CM | POA: Diagnosis not present

## 2020-11-16 DIAGNOSIS — R262 Difficulty in walking, not elsewhere classified: Secondary | ICD-10-CM | POA: Diagnosis not present

## 2020-11-16 DIAGNOSIS — A419 Sepsis, unspecified organism: Secondary | ICD-10-CM | POA: Diagnosis not present

## 2020-11-16 DIAGNOSIS — L89150 Pressure ulcer of sacral region, unstageable: Secondary | ICD-10-CM | POA: Diagnosis not present

## 2020-11-16 DIAGNOSIS — L89120 Pressure ulcer of left upper back, unstageable: Secondary | ICD-10-CM | POA: Diagnosis not present

## 2020-11-16 DIAGNOSIS — L89610 Pressure ulcer of right heel, unstageable: Secondary | ICD-10-CM | POA: Diagnosis not present

## 2020-11-16 DIAGNOSIS — R52 Pain, unspecified: Secondary | ICD-10-CM | POA: Diagnosis not present

## 2020-11-16 DIAGNOSIS — M6281 Muscle weakness (generalized): Secondary | ICD-10-CM | POA: Diagnosis not present

## 2020-11-16 DIAGNOSIS — L89154 Pressure ulcer of sacral region, stage 4: Secondary | ICD-10-CM | POA: Diagnosis not present

## 2020-11-17 DIAGNOSIS — R52 Pain, unspecified: Secondary | ICD-10-CM | POA: Diagnosis not present

## 2020-11-17 DIAGNOSIS — L89154 Pressure ulcer of sacral region, stage 4: Secondary | ICD-10-CM | POA: Diagnosis not present

## 2020-11-17 DIAGNOSIS — L89610 Pressure ulcer of right heel, unstageable: Secondary | ICD-10-CM | POA: Diagnosis not present

## 2020-11-17 DIAGNOSIS — L89120 Pressure ulcer of left upper back, unstageable: Secondary | ICD-10-CM | POA: Diagnosis not present

## 2020-11-17 DIAGNOSIS — M6281 Muscle weakness (generalized): Secondary | ICD-10-CM | POA: Diagnosis not present

## 2020-11-17 DIAGNOSIS — R262 Difficulty in walking, not elsewhere classified: Secondary | ICD-10-CM | POA: Diagnosis not present

## 2020-11-17 DIAGNOSIS — A419 Sepsis, unspecified organism: Secondary | ICD-10-CM | POA: Diagnosis not present

## 2020-11-17 DIAGNOSIS — R2681 Unsteadiness on feet: Secondary | ICD-10-CM | POA: Diagnosis not present

## 2020-11-17 DIAGNOSIS — L89150 Pressure ulcer of sacral region, unstageable: Secondary | ICD-10-CM | POA: Diagnosis not present

## 2020-11-18 DIAGNOSIS — F331 Major depressive disorder, recurrent, moderate: Secondary | ICD-10-CM | POA: Diagnosis not present

## 2020-11-18 DIAGNOSIS — N1831 Chronic kidney disease, stage 3a: Secondary | ICD-10-CM | POA: Diagnosis not present

## 2020-11-18 DIAGNOSIS — G8929 Other chronic pain: Secondary | ICD-10-CM | POA: Diagnosis not present

## 2020-11-18 DIAGNOSIS — L89616 Pressure-induced deep tissue damage of right heel: Secondary | ICD-10-CM | POA: Diagnosis not present

## 2020-11-21 DIAGNOSIS — L89154 Pressure ulcer of sacral region, stage 4: Secondary | ICD-10-CM | POA: Diagnosis not present

## 2020-11-21 DIAGNOSIS — F331 Major depressive disorder, recurrent, moderate: Secondary | ICD-10-CM | POA: Diagnosis not present

## 2020-11-21 DIAGNOSIS — A419 Sepsis, unspecified organism: Secondary | ICD-10-CM | POA: Diagnosis not present

## 2020-11-21 DIAGNOSIS — R2681 Unsteadiness on feet: Secondary | ICD-10-CM | POA: Diagnosis not present

## 2020-11-21 DIAGNOSIS — L89610 Pressure ulcer of right heel, unstageable: Secondary | ICD-10-CM | POA: Diagnosis not present

## 2020-11-21 DIAGNOSIS — R262 Difficulty in walking, not elsewhere classified: Secondary | ICD-10-CM | POA: Diagnosis not present

## 2020-11-21 DIAGNOSIS — L89120 Pressure ulcer of left upper back, unstageable: Secondary | ICD-10-CM | POA: Diagnosis not present

## 2020-11-21 DIAGNOSIS — L89616 Pressure-induced deep tissue damage of right heel: Secondary | ICD-10-CM | POA: Diagnosis not present

## 2020-11-21 DIAGNOSIS — R52 Pain, unspecified: Secondary | ICD-10-CM | POA: Diagnosis not present

## 2020-11-21 DIAGNOSIS — R296 Repeated falls: Secondary | ICD-10-CM | POA: Diagnosis not present

## 2020-11-21 DIAGNOSIS — M6281 Muscle weakness (generalized): Secondary | ICD-10-CM | POA: Diagnosis not present

## 2020-11-21 DIAGNOSIS — L89150 Pressure ulcer of sacral region, unstageable: Secondary | ICD-10-CM | POA: Diagnosis not present

## 2020-11-22 DIAGNOSIS — R2681 Unsteadiness on feet: Secondary | ICD-10-CM | POA: Diagnosis not present

## 2020-11-22 DIAGNOSIS — R262 Difficulty in walking, not elsewhere classified: Secondary | ICD-10-CM | POA: Diagnosis not present

## 2020-11-22 DIAGNOSIS — A419 Sepsis, unspecified organism: Secondary | ICD-10-CM | POA: Diagnosis not present

## 2020-11-22 DIAGNOSIS — R52 Pain, unspecified: Secondary | ICD-10-CM | POA: Diagnosis not present

## 2020-11-22 DIAGNOSIS — L89610 Pressure ulcer of right heel, unstageable: Secondary | ICD-10-CM | POA: Diagnosis not present

## 2020-11-22 DIAGNOSIS — L89154 Pressure ulcer of sacral region, stage 4: Secondary | ICD-10-CM | POA: Diagnosis not present

## 2020-11-22 DIAGNOSIS — M6281 Muscle weakness (generalized): Secondary | ICD-10-CM | POA: Diagnosis not present

## 2020-11-22 DIAGNOSIS — L89150 Pressure ulcer of sacral region, unstageable: Secondary | ICD-10-CM | POA: Diagnosis not present

## 2020-11-22 DIAGNOSIS — L89120 Pressure ulcer of left upper back, unstageable: Secondary | ICD-10-CM | POA: Diagnosis not present

## 2020-11-23 DIAGNOSIS — M6281 Muscle weakness (generalized): Secondary | ICD-10-CM | POA: Diagnosis not present

## 2020-11-23 DIAGNOSIS — R2681 Unsteadiness on feet: Secondary | ICD-10-CM | POA: Diagnosis not present

## 2020-11-23 DIAGNOSIS — L89154 Pressure ulcer of sacral region, stage 4: Secondary | ICD-10-CM | POA: Diagnosis not present

## 2020-11-23 DIAGNOSIS — L89120 Pressure ulcer of left upper back, unstageable: Secondary | ICD-10-CM | POA: Diagnosis not present

## 2020-11-23 DIAGNOSIS — R262 Difficulty in walking, not elsewhere classified: Secondary | ICD-10-CM | POA: Diagnosis not present

## 2020-11-23 DIAGNOSIS — A419 Sepsis, unspecified organism: Secondary | ICD-10-CM | POA: Diagnosis not present

## 2020-11-23 DIAGNOSIS — R52 Pain, unspecified: Secondary | ICD-10-CM | POA: Diagnosis not present

## 2020-11-23 DIAGNOSIS — L89610 Pressure ulcer of right heel, unstageable: Secondary | ICD-10-CM | POA: Diagnosis not present

## 2020-11-23 DIAGNOSIS — L89150 Pressure ulcer of sacral region, unstageable: Secondary | ICD-10-CM | POA: Diagnosis not present

## 2020-11-25 DIAGNOSIS — R52 Pain, unspecified: Secondary | ICD-10-CM | POA: Diagnosis not present

## 2020-11-25 DIAGNOSIS — L89150 Pressure ulcer of sacral region, unstageable: Secondary | ICD-10-CM | POA: Diagnosis not present

## 2020-11-25 DIAGNOSIS — R2681 Unsteadiness on feet: Secondary | ICD-10-CM | POA: Diagnosis not present

## 2020-11-25 DIAGNOSIS — L89610 Pressure ulcer of right heel, unstageable: Secondary | ICD-10-CM | POA: Diagnosis not present

## 2020-11-25 DIAGNOSIS — R262 Difficulty in walking, not elsewhere classified: Secondary | ICD-10-CM | POA: Diagnosis not present

## 2020-11-25 DIAGNOSIS — M6281 Muscle weakness (generalized): Secondary | ICD-10-CM | POA: Diagnosis not present

## 2020-11-25 DIAGNOSIS — A419 Sepsis, unspecified organism: Secondary | ICD-10-CM | POA: Diagnosis not present

## 2020-11-25 DIAGNOSIS — L89154 Pressure ulcer of sacral region, stage 4: Secondary | ICD-10-CM | POA: Diagnosis not present

## 2020-11-25 DIAGNOSIS — L89120 Pressure ulcer of left upper back, unstageable: Secondary | ICD-10-CM | POA: Diagnosis not present

## 2020-11-29 DIAGNOSIS — F331 Major depressive disorder, recurrent, moderate: Secondary | ICD-10-CM | POA: Diagnosis not present

## 2020-11-29 DIAGNOSIS — L89616 Pressure-induced deep tissue damage of right heel: Secondary | ICD-10-CM | POA: Diagnosis not present

## 2020-11-29 DIAGNOSIS — G8929 Other chronic pain: Secondary | ICD-10-CM | POA: Diagnosis not present

## 2020-11-30 DIAGNOSIS — L89610 Pressure ulcer of right heel, unstageable: Secondary | ICD-10-CM | POA: Diagnosis not present

## 2020-12-07 DIAGNOSIS — F331 Major depressive disorder, recurrent, moderate: Secondary | ICD-10-CM | POA: Diagnosis not present

## 2020-12-07 DIAGNOSIS — G8929 Other chronic pain: Secondary | ICD-10-CM | POA: Diagnosis not present

## 2020-12-07 DIAGNOSIS — K5909 Other constipation: Secondary | ICD-10-CM | POA: Diagnosis not present

## 2020-12-07 DIAGNOSIS — L89616 Pressure-induced deep tissue damage of right heel: Secondary | ICD-10-CM | POA: Diagnosis not present

## 2020-12-13 DIAGNOSIS — J309 Allergic rhinitis, unspecified: Secondary | ICD-10-CM | POA: Diagnosis not present

## 2020-12-13 DIAGNOSIS — L89616 Pressure-induced deep tissue damage of right heel: Secondary | ICD-10-CM | POA: Diagnosis not present

## 2020-12-13 DIAGNOSIS — F331 Major depressive disorder, recurrent, moderate: Secondary | ICD-10-CM | POA: Diagnosis not present

## 2020-12-13 DIAGNOSIS — F1194 Opioid use, unspecified with opioid-induced mood disorder: Secondary | ICD-10-CM | POA: Diagnosis not present

## 2020-12-13 DIAGNOSIS — F064 Anxiety disorder due to known physiological condition: Secondary | ICD-10-CM | POA: Diagnosis not present

## 2020-12-14 DIAGNOSIS — L89610 Pressure ulcer of right heel, unstageable: Secondary | ICD-10-CM | POA: Diagnosis not present

## 2020-12-22 DIAGNOSIS — S50311A Abrasion of right elbow, initial encounter: Secondary | ICD-10-CM | POA: Diagnosis not present

## 2020-12-22 DIAGNOSIS — R498 Other voice and resonance disorders: Secondary | ICD-10-CM | POA: Diagnosis not present

## 2020-12-22 DIAGNOSIS — M6281 Muscle weakness (generalized): Secondary | ICD-10-CM | POA: Diagnosis not present

## 2020-12-22 DIAGNOSIS — R1312 Dysphagia, oropharyngeal phase: Secondary | ICD-10-CM | POA: Diagnosis not present

## 2020-12-22 DIAGNOSIS — L89152 Pressure ulcer of sacral region, stage 2: Secondary | ICD-10-CM | POA: Diagnosis not present

## 2020-12-22 DIAGNOSIS — R488 Other symbolic dysfunctions: Secondary | ICD-10-CM | POA: Diagnosis not present

## 2020-12-22 DIAGNOSIS — G9341 Metabolic encephalopathy: Secondary | ICD-10-CM | POA: Diagnosis not present

## 2020-12-22 DIAGNOSIS — L89616 Pressure-induced deep tissue damage of right heel: Secondary | ICD-10-CM | POA: Diagnosis not present

## 2020-12-22 DIAGNOSIS — R41841 Cognitive communication deficit: Secondary | ICD-10-CM | POA: Diagnosis not present

## 2020-12-22 DIAGNOSIS — L89120 Pressure ulcer of left upper back, unstageable: Secondary | ICD-10-CM | POA: Diagnosis not present

## 2020-12-22 DIAGNOSIS — L97429 Non-pressure chronic ulcer of left heel and midfoot with unspecified severity: Secondary | ICD-10-CM | POA: Diagnosis not present

## 2020-12-22 DIAGNOSIS — J309 Allergic rhinitis, unspecified: Secondary | ICD-10-CM | POA: Diagnosis not present

## 2020-12-22 DIAGNOSIS — L97919 Non-pressure chronic ulcer of unspecified part of right lower leg with unspecified severity: Secondary | ICD-10-CM | POA: Diagnosis not present

## 2020-12-26 DIAGNOSIS — M6281 Muscle weakness (generalized): Secondary | ICD-10-CM | POA: Diagnosis not present

## 2020-12-26 DIAGNOSIS — R488 Other symbolic dysfunctions: Secondary | ICD-10-CM | POA: Diagnosis not present

## 2020-12-26 DIAGNOSIS — L89120 Pressure ulcer of left upper back, unstageable: Secondary | ICD-10-CM | POA: Diagnosis not present

## 2020-12-26 DIAGNOSIS — L97429 Non-pressure chronic ulcer of left heel and midfoot with unspecified severity: Secondary | ICD-10-CM | POA: Diagnosis not present

## 2020-12-26 DIAGNOSIS — L97919 Non-pressure chronic ulcer of unspecified part of right lower leg with unspecified severity: Secondary | ICD-10-CM | POA: Diagnosis not present

## 2020-12-26 DIAGNOSIS — G9341 Metabolic encephalopathy: Secondary | ICD-10-CM | POA: Diagnosis not present

## 2020-12-26 DIAGNOSIS — R41841 Cognitive communication deficit: Secondary | ICD-10-CM | POA: Diagnosis not present

## 2020-12-26 DIAGNOSIS — R1312 Dysphagia, oropharyngeal phase: Secondary | ICD-10-CM | POA: Diagnosis not present

## 2020-12-26 DIAGNOSIS — R498 Other voice and resonance disorders: Secondary | ICD-10-CM | POA: Diagnosis not present

## 2020-12-27 DIAGNOSIS — L97429 Non-pressure chronic ulcer of left heel and midfoot with unspecified severity: Secondary | ICD-10-CM | POA: Diagnosis not present

## 2020-12-27 DIAGNOSIS — L89120 Pressure ulcer of left upper back, unstageable: Secondary | ICD-10-CM | POA: Diagnosis not present

## 2020-12-27 DIAGNOSIS — F331 Major depressive disorder, recurrent, moderate: Secondary | ICD-10-CM | POA: Diagnosis not present

## 2020-12-27 DIAGNOSIS — M6281 Muscle weakness (generalized): Secondary | ICD-10-CM | POA: Diagnosis not present

## 2020-12-27 DIAGNOSIS — R498 Other voice and resonance disorders: Secondary | ICD-10-CM | POA: Diagnosis not present

## 2020-12-27 DIAGNOSIS — G9341 Metabolic encephalopathy: Secondary | ICD-10-CM | POA: Diagnosis not present

## 2020-12-27 DIAGNOSIS — R41841 Cognitive communication deficit: Secondary | ICD-10-CM | POA: Diagnosis not present

## 2020-12-27 DIAGNOSIS — F1194 Opioid use, unspecified with opioid-induced mood disorder: Secondary | ICD-10-CM | POA: Diagnosis not present

## 2020-12-27 DIAGNOSIS — F064 Anxiety disorder due to known physiological condition: Secondary | ICD-10-CM | POA: Diagnosis not present

## 2020-12-27 DIAGNOSIS — R488 Other symbolic dysfunctions: Secondary | ICD-10-CM | POA: Diagnosis not present

## 2020-12-27 DIAGNOSIS — L97919 Non-pressure chronic ulcer of unspecified part of right lower leg with unspecified severity: Secondary | ICD-10-CM | POA: Diagnosis not present

## 2020-12-27 DIAGNOSIS — R1312 Dysphagia, oropharyngeal phase: Secondary | ICD-10-CM | POA: Diagnosis not present

## 2020-12-28 DIAGNOSIS — M6281 Muscle weakness (generalized): Secondary | ICD-10-CM | POA: Diagnosis not present

## 2020-12-28 DIAGNOSIS — R1312 Dysphagia, oropharyngeal phase: Secondary | ICD-10-CM | POA: Diagnosis not present

## 2020-12-28 DIAGNOSIS — R41841 Cognitive communication deficit: Secondary | ICD-10-CM | POA: Diagnosis not present

## 2020-12-28 DIAGNOSIS — R498 Other voice and resonance disorders: Secondary | ICD-10-CM | POA: Diagnosis not present

## 2020-12-28 DIAGNOSIS — R488 Other symbolic dysfunctions: Secondary | ICD-10-CM | POA: Diagnosis not present

## 2020-12-28 DIAGNOSIS — G9341 Metabolic encephalopathy: Secondary | ICD-10-CM | POA: Diagnosis not present

## 2020-12-28 DIAGNOSIS — L97429 Non-pressure chronic ulcer of left heel and midfoot with unspecified severity: Secondary | ICD-10-CM | POA: Diagnosis not present

## 2020-12-28 DIAGNOSIS — L89120 Pressure ulcer of left upper back, unstageable: Secondary | ICD-10-CM | POA: Diagnosis not present

## 2020-12-28 DIAGNOSIS — L97919 Non-pressure chronic ulcer of unspecified part of right lower leg with unspecified severity: Secondary | ICD-10-CM | POA: Diagnosis not present

## 2020-12-29 DIAGNOSIS — L97429 Non-pressure chronic ulcer of left heel and midfoot with unspecified severity: Secondary | ICD-10-CM | POA: Diagnosis not present

## 2020-12-29 DIAGNOSIS — R1312 Dysphagia, oropharyngeal phase: Secondary | ICD-10-CM | POA: Diagnosis not present

## 2020-12-29 DIAGNOSIS — R488 Other symbolic dysfunctions: Secondary | ICD-10-CM | POA: Diagnosis not present

## 2020-12-29 DIAGNOSIS — R498 Other voice and resonance disorders: Secondary | ICD-10-CM | POA: Diagnosis not present

## 2020-12-29 DIAGNOSIS — G9341 Metabolic encephalopathy: Secondary | ICD-10-CM | POA: Diagnosis not present

## 2020-12-29 DIAGNOSIS — L97919 Non-pressure chronic ulcer of unspecified part of right lower leg with unspecified severity: Secondary | ICD-10-CM | POA: Diagnosis not present

## 2020-12-29 DIAGNOSIS — M6281 Muscle weakness (generalized): Secondary | ICD-10-CM | POA: Diagnosis not present

## 2020-12-29 DIAGNOSIS — R41841 Cognitive communication deficit: Secondary | ICD-10-CM | POA: Diagnosis not present

## 2020-12-29 DIAGNOSIS — L89120 Pressure ulcer of left upper back, unstageable: Secondary | ICD-10-CM | POA: Diagnosis not present

## 2020-12-30 DIAGNOSIS — L97429 Non-pressure chronic ulcer of left heel and midfoot with unspecified severity: Secondary | ICD-10-CM | POA: Diagnosis not present

## 2020-12-30 DIAGNOSIS — R488 Other symbolic dysfunctions: Secondary | ICD-10-CM | POA: Diagnosis not present

## 2020-12-30 DIAGNOSIS — R1312 Dysphagia, oropharyngeal phase: Secondary | ICD-10-CM | POA: Diagnosis not present

## 2020-12-30 DIAGNOSIS — R41841 Cognitive communication deficit: Secondary | ICD-10-CM | POA: Diagnosis not present

## 2020-12-30 DIAGNOSIS — R498 Other voice and resonance disorders: Secondary | ICD-10-CM | POA: Diagnosis not present

## 2020-12-30 DIAGNOSIS — L89120 Pressure ulcer of left upper back, unstageable: Secondary | ICD-10-CM | POA: Diagnosis not present

## 2020-12-30 DIAGNOSIS — L97919 Non-pressure chronic ulcer of unspecified part of right lower leg with unspecified severity: Secondary | ICD-10-CM | POA: Diagnosis not present

## 2020-12-30 DIAGNOSIS — G9341 Metabolic encephalopathy: Secondary | ICD-10-CM | POA: Diagnosis not present

## 2020-12-30 DIAGNOSIS — M6281 Muscle weakness (generalized): Secondary | ICD-10-CM | POA: Diagnosis not present

## 2021-01-02 DIAGNOSIS — L89120 Pressure ulcer of left upper back, unstageable: Secondary | ICD-10-CM | POA: Diagnosis not present

## 2021-01-02 DIAGNOSIS — M6281 Muscle weakness (generalized): Secondary | ICD-10-CM | POA: Diagnosis not present

## 2021-01-02 DIAGNOSIS — R488 Other symbolic dysfunctions: Secondary | ICD-10-CM | POA: Diagnosis not present

## 2021-01-02 DIAGNOSIS — R1312 Dysphagia, oropharyngeal phase: Secondary | ICD-10-CM | POA: Diagnosis not present

## 2021-01-02 DIAGNOSIS — L97919 Non-pressure chronic ulcer of unspecified part of right lower leg with unspecified severity: Secondary | ICD-10-CM | POA: Diagnosis not present

## 2021-01-02 DIAGNOSIS — R498 Other voice and resonance disorders: Secondary | ICD-10-CM | POA: Diagnosis not present

## 2021-01-02 DIAGNOSIS — R41841 Cognitive communication deficit: Secondary | ICD-10-CM | POA: Diagnosis not present

## 2021-01-02 DIAGNOSIS — L97429 Non-pressure chronic ulcer of left heel and midfoot with unspecified severity: Secondary | ICD-10-CM | POA: Diagnosis not present

## 2021-01-02 DIAGNOSIS — G9341 Metabolic encephalopathy: Secondary | ICD-10-CM | POA: Diagnosis not present

## 2021-01-03 DIAGNOSIS — L97429 Non-pressure chronic ulcer of left heel and midfoot with unspecified severity: Secondary | ICD-10-CM | POA: Diagnosis not present

## 2021-01-03 DIAGNOSIS — R488 Other symbolic dysfunctions: Secondary | ICD-10-CM | POA: Diagnosis not present

## 2021-01-03 DIAGNOSIS — L89120 Pressure ulcer of left upper back, unstageable: Secondary | ICD-10-CM | POA: Diagnosis not present

## 2021-01-03 DIAGNOSIS — R1312 Dysphagia, oropharyngeal phase: Secondary | ICD-10-CM | POA: Diagnosis not present

## 2021-01-03 DIAGNOSIS — R41841 Cognitive communication deficit: Secondary | ICD-10-CM | POA: Diagnosis not present

## 2021-01-03 DIAGNOSIS — R498 Other voice and resonance disorders: Secondary | ICD-10-CM | POA: Diagnosis not present

## 2021-01-03 DIAGNOSIS — M6281 Muscle weakness (generalized): Secondary | ICD-10-CM | POA: Diagnosis not present

## 2021-01-03 DIAGNOSIS — L97919 Non-pressure chronic ulcer of unspecified part of right lower leg with unspecified severity: Secondary | ICD-10-CM | POA: Diagnosis not present

## 2021-01-03 DIAGNOSIS — G9341 Metabolic encephalopathy: Secondary | ICD-10-CM | POA: Diagnosis not present

## 2021-01-04 DIAGNOSIS — L97919 Non-pressure chronic ulcer of unspecified part of right lower leg with unspecified severity: Secondary | ICD-10-CM | POA: Diagnosis not present

## 2021-01-04 DIAGNOSIS — R488 Other symbolic dysfunctions: Secondary | ICD-10-CM | POA: Diagnosis not present

## 2021-01-04 DIAGNOSIS — R1312 Dysphagia, oropharyngeal phase: Secondary | ICD-10-CM | POA: Diagnosis not present

## 2021-01-04 DIAGNOSIS — L89120 Pressure ulcer of left upper back, unstageable: Secondary | ICD-10-CM | POA: Diagnosis not present

## 2021-01-04 DIAGNOSIS — L97429 Non-pressure chronic ulcer of left heel and midfoot with unspecified severity: Secondary | ICD-10-CM | POA: Diagnosis not present

## 2021-01-04 DIAGNOSIS — R498 Other voice and resonance disorders: Secondary | ICD-10-CM | POA: Diagnosis not present

## 2021-01-04 DIAGNOSIS — G9341 Metabolic encephalopathy: Secondary | ICD-10-CM | POA: Diagnosis not present

## 2021-01-04 DIAGNOSIS — M6281 Muscle weakness (generalized): Secondary | ICD-10-CM | POA: Diagnosis not present

## 2021-01-04 DIAGNOSIS — L89154 Pressure ulcer of sacral region, stage 4: Secondary | ICD-10-CM | POA: Diagnosis not present

## 2021-01-04 DIAGNOSIS — R41841 Cognitive communication deficit: Secondary | ICD-10-CM | POA: Diagnosis not present

## 2021-01-05 DIAGNOSIS — E1129 Type 2 diabetes mellitus with other diabetic kidney complication: Secondary | ICD-10-CM | POA: Diagnosis not present

## 2021-01-10 DIAGNOSIS — F331 Major depressive disorder, recurrent, moderate: Secondary | ICD-10-CM | POA: Diagnosis not present

## 2021-01-10 DIAGNOSIS — F064 Anxiety disorder due to known physiological condition: Secondary | ICD-10-CM | POA: Diagnosis not present

## 2021-01-10 DIAGNOSIS — F1194 Opioid use, unspecified with opioid-induced mood disorder: Secondary | ICD-10-CM | POA: Diagnosis not present

## 2021-01-11 DIAGNOSIS — L89613 Pressure ulcer of right heel, stage 3: Secondary | ICD-10-CM | POA: Diagnosis not present

## 2021-01-13 DIAGNOSIS — L89613 Pressure ulcer of right heel, stage 3: Secondary | ICD-10-CM | POA: Diagnosis not present

## 2021-01-13 DIAGNOSIS — E118 Type 2 diabetes mellitus with unspecified complications: Secondary | ICD-10-CM | POA: Diagnosis not present

## 2021-01-13 DIAGNOSIS — L89154 Pressure ulcer of sacral region, stage 4: Secondary | ICD-10-CM | POA: Diagnosis not present

## 2021-01-18 DIAGNOSIS — L89613 Pressure ulcer of right heel, stage 3: Secondary | ICD-10-CM | POA: Diagnosis not present

## 2021-01-18 DIAGNOSIS — L89154 Pressure ulcer of sacral region, stage 4: Secondary | ICD-10-CM | POA: Diagnosis not present

## 2021-01-23 DIAGNOSIS — L89154 Pressure ulcer of sacral region, stage 4: Secondary | ICD-10-CM | POA: Diagnosis not present

## 2021-01-23 DIAGNOSIS — F331 Major depressive disorder, recurrent, moderate: Secondary | ICD-10-CM | POA: Diagnosis not present

## 2021-01-23 DIAGNOSIS — E118 Type 2 diabetes mellitus with unspecified complications: Secondary | ICD-10-CM | POA: Diagnosis not present

## 2021-01-23 DIAGNOSIS — L89613 Pressure ulcer of right heel, stage 3: Secondary | ICD-10-CM | POA: Diagnosis not present

## 2021-01-25 DIAGNOSIS — L89154 Pressure ulcer of sacral region, stage 4: Secondary | ICD-10-CM | POA: Diagnosis not present

## 2021-01-25 DIAGNOSIS — F064 Anxiety disorder due to known physiological condition: Secondary | ICD-10-CM | POA: Diagnosis not present

## 2021-01-25 DIAGNOSIS — G4701 Insomnia due to medical condition: Secondary | ICD-10-CM | POA: Diagnosis not present

## 2021-01-25 DIAGNOSIS — L89613 Pressure ulcer of right heel, stage 3: Secondary | ICD-10-CM | POA: Diagnosis not present

## 2021-01-25 DIAGNOSIS — F331 Major depressive disorder, recurrent, moderate: Secondary | ICD-10-CM | POA: Diagnosis not present

## 2021-01-30 DIAGNOSIS — R4182 Altered mental status, unspecified: Secondary | ICD-10-CM | POA: Diagnosis not present

## 2021-01-31 DIAGNOSIS — Z7151 Drug abuse counseling and surveillance of drug abuser: Secondary | ICD-10-CM | POA: Diagnosis not present

## 2021-01-31 DIAGNOSIS — R4182 Altered mental status, unspecified: Secondary | ICD-10-CM | POA: Diagnosis not present

## 2021-01-31 DIAGNOSIS — F1910 Other psychoactive substance abuse, uncomplicated: Secondary | ICD-10-CM | POA: Diagnosis not present

## 2021-01-31 DIAGNOSIS — L89613 Pressure ulcer of right heel, stage 3: Secondary | ICD-10-CM | POA: Diagnosis not present

## 2021-01-31 DIAGNOSIS — E118 Type 2 diabetes mellitus with unspecified complications: Secondary | ICD-10-CM | POA: Diagnosis not present

## 2021-01-31 DIAGNOSIS — L89154 Pressure ulcer of sacral region, stage 4: Secondary | ICD-10-CM | POA: Diagnosis not present

## 2021-01-31 DIAGNOSIS — Z79899 Other long term (current) drug therapy: Secondary | ICD-10-CM | POA: Diagnosis not present

## 2021-01-31 DIAGNOSIS — F331 Major depressive disorder, recurrent, moderate: Secondary | ICD-10-CM | POA: Diagnosis not present

## 2021-02-01 DIAGNOSIS — L89613 Pressure ulcer of right heel, stage 3: Secondary | ICD-10-CM | POA: Diagnosis not present

## 2021-02-01 DIAGNOSIS — Z872 Personal history of diseases of the skin and subcutaneous tissue: Secondary | ICD-10-CM | POA: Diagnosis not present

## 2021-02-01 DIAGNOSIS — L89154 Pressure ulcer of sacral region, stage 4: Secondary | ICD-10-CM | POA: Diagnosis not present

## 2021-02-07 DIAGNOSIS — F064 Anxiety disorder due to known physiological condition: Secondary | ICD-10-CM | POA: Diagnosis not present

## 2021-02-07 DIAGNOSIS — F331 Major depressive disorder, recurrent, moderate: Secondary | ICD-10-CM | POA: Diagnosis not present

## 2021-02-07 DIAGNOSIS — F1194 Opioid use, unspecified with opioid-induced mood disorder: Secondary | ICD-10-CM | POA: Diagnosis not present

## 2021-02-08 DIAGNOSIS — F331 Major depressive disorder, recurrent, moderate: Secondary | ICD-10-CM | POA: Diagnosis not present

## 2021-02-08 DIAGNOSIS — L89613 Pressure ulcer of right heel, stage 3: Secondary | ICD-10-CM | POA: Diagnosis not present

## 2021-02-08 DIAGNOSIS — F1194 Opioid use, unspecified with opioid-induced mood disorder: Secondary | ICD-10-CM | POA: Diagnosis not present

## 2021-02-08 DIAGNOSIS — Z872 Personal history of diseases of the skin and subcutaneous tissue: Secondary | ICD-10-CM | POA: Diagnosis not present

## 2021-02-15 DIAGNOSIS — F331 Major depressive disorder, recurrent, moderate: Secondary | ICD-10-CM | POA: Diagnosis not present

## 2021-02-15 DIAGNOSIS — R4182 Altered mental status, unspecified: Secondary | ICD-10-CM | POA: Diagnosis not present

## 2021-02-15 DIAGNOSIS — E785 Hyperlipidemia, unspecified: Secondary | ICD-10-CM | POA: Diagnosis not present

## 2021-02-15 DIAGNOSIS — R2681 Unsteadiness on feet: Secondary | ICD-10-CM | POA: Diagnosis not present

## 2021-02-15 DIAGNOSIS — Z7151 Drug abuse counseling and surveillance of drug abuser: Secondary | ICD-10-CM | POA: Diagnosis not present

## 2021-02-15 DIAGNOSIS — E46 Unspecified protein-calorie malnutrition: Secondary | ICD-10-CM | POA: Diagnosis not present

## 2021-02-15 DIAGNOSIS — L89613 Pressure ulcer of right heel, stage 3: Secondary | ICD-10-CM | POA: Diagnosis not present

## 2021-02-15 DIAGNOSIS — E118 Type 2 diabetes mellitus with unspecified complications: Secondary | ICD-10-CM | POA: Diagnosis not present

## 2021-02-15 DIAGNOSIS — I10 Essential (primary) hypertension: Secondary | ICD-10-CM | POA: Diagnosis not present

## 2021-02-15 DIAGNOSIS — I739 Peripheral vascular disease, unspecified: Secondary | ICD-10-CM | POA: Diagnosis not present

## 2021-02-17 DIAGNOSIS — L89610 Pressure ulcer of right heel, unstageable: Secondary | ICD-10-CM | POA: Diagnosis not present

## 2021-02-17 DIAGNOSIS — R2681 Unsteadiness on feet: Secondary | ICD-10-CM | POA: Diagnosis not present

## 2021-02-17 DIAGNOSIS — L89613 Pressure ulcer of right heel, stage 3: Secondary | ICD-10-CM | POA: Diagnosis not present

## 2021-02-17 DIAGNOSIS — L97412 Non-pressure chronic ulcer of right heel and midfoot with fat layer exposed: Secondary | ICD-10-CM | POA: Diagnosis not present

## 2021-02-17 DIAGNOSIS — L89154 Pressure ulcer of sacral region, stage 4: Secondary | ICD-10-CM | POA: Diagnosis not present

## 2021-02-17 DIAGNOSIS — M6281 Muscle weakness (generalized): Secondary | ICD-10-CM | POA: Diagnosis not present

## 2021-02-17 DIAGNOSIS — L89620 Pressure ulcer of left heel, unstageable: Secondary | ICD-10-CM | POA: Diagnosis not present

## 2021-02-17 DIAGNOSIS — R41841 Cognitive communication deficit: Secondary | ICD-10-CM | POA: Diagnosis not present

## 2021-02-17 DIAGNOSIS — L89150 Pressure ulcer of sacral region, unstageable: Secondary | ICD-10-CM | POA: Diagnosis not present

## 2021-02-17 DIAGNOSIS — E118 Type 2 diabetes mellitus with unspecified complications: Secondary | ICD-10-CM | POA: Diagnosis not present

## 2021-02-17 DIAGNOSIS — L89120 Pressure ulcer of left upper back, unstageable: Secondary | ICD-10-CM | POA: Diagnosis not present

## 2021-02-17 DIAGNOSIS — R262 Difficulty in walking, not elsewhere classified: Secondary | ICD-10-CM | POA: Diagnosis not present

## 2021-02-21 DIAGNOSIS — F331 Major depressive disorder, recurrent, moderate: Secondary | ICD-10-CM | POA: Diagnosis not present

## 2021-02-21 DIAGNOSIS — L89610 Pressure ulcer of right heel, unstageable: Secondary | ICD-10-CM | POA: Diagnosis not present

## 2021-02-21 DIAGNOSIS — F1194 Opioid use, unspecified with opioid-induced mood disorder: Secondary | ICD-10-CM | POA: Diagnosis not present

## 2021-02-21 DIAGNOSIS — R2681 Unsteadiness on feet: Secondary | ICD-10-CM | POA: Diagnosis not present

## 2021-02-21 DIAGNOSIS — M6281 Muscle weakness (generalized): Secondary | ICD-10-CM | POA: Diagnosis not present

## 2021-02-21 DIAGNOSIS — R262 Difficulty in walking, not elsewhere classified: Secondary | ICD-10-CM | POA: Diagnosis not present

## 2021-02-21 DIAGNOSIS — R41841 Cognitive communication deficit: Secondary | ICD-10-CM | POA: Diagnosis not present

## 2021-02-21 DIAGNOSIS — F064 Anxiety disorder due to known physiological condition: Secondary | ICD-10-CM | POA: Diagnosis not present

## 2021-02-21 DIAGNOSIS — L89620 Pressure ulcer of left heel, unstageable: Secondary | ICD-10-CM | POA: Diagnosis not present

## 2021-02-21 DIAGNOSIS — L89120 Pressure ulcer of left upper back, unstageable: Secondary | ICD-10-CM | POA: Diagnosis not present

## 2021-02-21 DIAGNOSIS — L89150 Pressure ulcer of sacral region, unstageable: Secondary | ICD-10-CM | POA: Diagnosis not present

## 2021-02-21 DIAGNOSIS — L97412 Non-pressure chronic ulcer of right heel and midfoot with fat layer exposed: Secondary | ICD-10-CM | POA: Diagnosis not present

## 2021-02-22 DIAGNOSIS — L97412 Non-pressure chronic ulcer of right heel and midfoot with fat layer exposed: Secondary | ICD-10-CM | POA: Diagnosis not present

## 2021-02-22 DIAGNOSIS — R41841 Cognitive communication deficit: Secondary | ICD-10-CM | POA: Diagnosis not present

## 2021-02-22 DIAGNOSIS — L89150 Pressure ulcer of sacral region, unstageable: Secondary | ICD-10-CM | POA: Diagnosis not present

## 2021-02-22 DIAGNOSIS — L89120 Pressure ulcer of left upper back, unstageable: Secondary | ICD-10-CM | POA: Diagnosis not present

## 2021-02-22 DIAGNOSIS — L89610 Pressure ulcer of right heel, unstageable: Secondary | ICD-10-CM | POA: Diagnosis not present

## 2021-02-22 DIAGNOSIS — R262 Difficulty in walking, not elsewhere classified: Secondary | ICD-10-CM | POA: Diagnosis not present

## 2021-02-22 DIAGNOSIS — L89613 Pressure ulcer of right heel, stage 3: Secondary | ICD-10-CM | POA: Diagnosis not present

## 2021-02-22 DIAGNOSIS — L89620 Pressure ulcer of left heel, unstageable: Secondary | ICD-10-CM | POA: Diagnosis not present

## 2021-02-22 DIAGNOSIS — M6281 Muscle weakness (generalized): Secondary | ICD-10-CM | POA: Diagnosis not present

## 2021-02-22 DIAGNOSIS — R2681 Unsteadiness on feet: Secondary | ICD-10-CM | POA: Diagnosis not present

## 2021-02-23 DIAGNOSIS — L89610 Pressure ulcer of right heel, unstageable: Secondary | ICD-10-CM | POA: Diagnosis not present

## 2021-02-23 DIAGNOSIS — R2681 Unsteadiness on feet: Secondary | ICD-10-CM | POA: Diagnosis not present

## 2021-02-23 DIAGNOSIS — M6281 Muscle weakness (generalized): Secondary | ICD-10-CM | POA: Diagnosis not present

## 2021-02-23 DIAGNOSIS — R41841 Cognitive communication deficit: Secondary | ICD-10-CM | POA: Diagnosis not present

## 2021-02-23 DIAGNOSIS — L89120 Pressure ulcer of left upper back, unstageable: Secondary | ICD-10-CM | POA: Diagnosis not present

## 2021-02-23 DIAGNOSIS — L97412 Non-pressure chronic ulcer of right heel and midfoot with fat layer exposed: Secondary | ICD-10-CM | POA: Diagnosis not present

## 2021-02-23 DIAGNOSIS — R262 Difficulty in walking, not elsewhere classified: Secondary | ICD-10-CM | POA: Diagnosis not present

## 2021-02-23 DIAGNOSIS — L89620 Pressure ulcer of left heel, unstageable: Secondary | ICD-10-CM | POA: Diagnosis not present

## 2021-02-23 DIAGNOSIS — L89150 Pressure ulcer of sacral region, unstageable: Secondary | ICD-10-CM | POA: Diagnosis not present

## 2021-02-24 DIAGNOSIS — L89610 Pressure ulcer of right heel, unstageable: Secondary | ICD-10-CM | POA: Diagnosis not present

## 2021-02-24 DIAGNOSIS — R262 Difficulty in walking, not elsewhere classified: Secondary | ICD-10-CM | POA: Diagnosis not present

## 2021-02-24 DIAGNOSIS — R2681 Unsteadiness on feet: Secondary | ICD-10-CM | POA: Diagnosis not present

## 2021-02-24 DIAGNOSIS — M6281 Muscle weakness (generalized): Secondary | ICD-10-CM | POA: Diagnosis not present

## 2021-02-24 DIAGNOSIS — L97412 Non-pressure chronic ulcer of right heel and midfoot with fat layer exposed: Secondary | ICD-10-CM | POA: Diagnosis not present

## 2021-02-24 DIAGNOSIS — L89120 Pressure ulcer of left upper back, unstageable: Secondary | ICD-10-CM | POA: Diagnosis not present

## 2021-02-24 DIAGNOSIS — R41841 Cognitive communication deficit: Secondary | ICD-10-CM | POA: Diagnosis not present

## 2021-02-24 DIAGNOSIS — L89150 Pressure ulcer of sacral region, unstageable: Secondary | ICD-10-CM | POA: Diagnosis not present

## 2021-02-24 DIAGNOSIS — L89620 Pressure ulcer of left heel, unstageable: Secondary | ICD-10-CM | POA: Diagnosis not present

## 2021-02-27 DIAGNOSIS — R262 Difficulty in walking, not elsewhere classified: Secondary | ICD-10-CM | POA: Diagnosis not present

## 2021-02-27 DIAGNOSIS — L89610 Pressure ulcer of right heel, unstageable: Secondary | ICD-10-CM | POA: Diagnosis not present

## 2021-02-27 DIAGNOSIS — M6281 Muscle weakness (generalized): Secondary | ICD-10-CM | POA: Diagnosis not present

## 2021-02-27 DIAGNOSIS — L89620 Pressure ulcer of left heel, unstageable: Secondary | ICD-10-CM | POA: Diagnosis not present

## 2021-02-27 DIAGNOSIS — L97412 Non-pressure chronic ulcer of right heel and midfoot with fat layer exposed: Secondary | ICD-10-CM | POA: Diagnosis not present

## 2021-02-27 DIAGNOSIS — R41841 Cognitive communication deficit: Secondary | ICD-10-CM | POA: Diagnosis not present

## 2021-02-27 DIAGNOSIS — L89120 Pressure ulcer of left upper back, unstageable: Secondary | ICD-10-CM | POA: Diagnosis not present

## 2021-02-27 DIAGNOSIS — R2681 Unsteadiness on feet: Secondary | ICD-10-CM | POA: Diagnosis not present

## 2021-02-27 DIAGNOSIS — L89150 Pressure ulcer of sacral region, unstageable: Secondary | ICD-10-CM | POA: Diagnosis not present

## 2021-02-28 DIAGNOSIS — L89610 Pressure ulcer of right heel, unstageable: Secondary | ICD-10-CM | POA: Diagnosis not present

## 2021-02-28 DIAGNOSIS — R262 Difficulty in walking, not elsewhere classified: Secondary | ICD-10-CM | POA: Diagnosis not present

## 2021-02-28 DIAGNOSIS — L89620 Pressure ulcer of left heel, unstageable: Secondary | ICD-10-CM | POA: Diagnosis not present

## 2021-02-28 DIAGNOSIS — R2681 Unsteadiness on feet: Secondary | ICD-10-CM | POA: Diagnosis not present

## 2021-02-28 DIAGNOSIS — M6281 Muscle weakness (generalized): Secondary | ICD-10-CM | POA: Diagnosis not present

## 2021-02-28 DIAGNOSIS — L89150 Pressure ulcer of sacral region, unstageable: Secondary | ICD-10-CM | POA: Diagnosis not present

## 2021-02-28 DIAGNOSIS — L97412 Non-pressure chronic ulcer of right heel and midfoot with fat layer exposed: Secondary | ICD-10-CM | POA: Diagnosis not present

## 2021-02-28 DIAGNOSIS — R41841 Cognitive communication deficit: Secondary | ICD-10-CM | POA: Diagnosis not present

## 2021-02-28 DIAGNOSIS — L89120 Pressure ulcer of left upper back, unstageable: Secondary | ICD-10-CM | POA: Diagnosis not present

## 2021-03-01 DIAGNOSIS — R262 Difficulty in walking, not elsewhere classified: Secondary | ICD-10-CM | POA: Diagnosis not present

## 2021-03-01 DIAGNOSIS — M6281 Muscle weakness (generalized): Secondary | ICD-10-CM | POA: Diagnosis not present

## 2021-03-01 DIAGNOSIS — L89613 Pressure ulcer of right heel, stage 3: Secondary | ICD-10-CM | POA: Diagnosis not present

## 2021-03-01 DIAGNOSIS — L89154 Pressure ulcer of sacral region, stage 4: Secondary | ICD-10-CM | POA: Diagnosis not present

## 2021-03-01 DIAGNOSIS — R41841 Cognitive communication deficit: Secondary | ICD-10-CM | POA: Diagnosis not present

## 2021-03-01 DIAGNOSIS — L97412 Non-pressure chronic ulcer of right heel and midfoot with fat layer exposed: Secondary | ICD-10-CM | POA: Diagnosis not present

## 2021-03-01 DIAGNOSIS — L89620 Pressure ulcer of left heel, unstageable: Secondary | ICD-10-CM | POA: Diagnosis not present

## 2021-03-01 DIAGNOSIS — L89610 Pressure ulcer of right heel, unstageable: Secondary | ICD-10-CM | POA: Diagnosis not present

## 2021-03-01 DIAGNOSIS — L89120 Pressure ulcer of left upper back, unstageable: Secondary | ICD-10-CM | POA: Diagnosis not present

## 2021-03-01 DIAGNOSIS — E1040 Type 1 diabetes mellitus with diabetic neuropathy, unspecified: Secondary | ICD-10-CM | POA: Diagnosis not present

## 2021-03-01 DIAGNOSIS — R2681 Unsteadiness on feet: Secondary | ICD-10-CM | POA: Diagnosis not present

## 2021-03-01 DIAGNOSIS — L89150 Pressure ulcer of sacral region, unstageable: Secondary | ICD-10-CM | POA: Diagnosis not present

## 2021-03-02 DIAGNOSIS — L89610 Pressure ulcer of right heel, unstageable: Secondary | ICD-10-CM | POA: Diagnosis not present

## 2021-03-02 DIAGNOSIS — L89120 Pressure ulcer of left upper back, unstageable: Secondary | ICD-10-CM | POA: Diagnosis not present

## 2021-03-02 DIAGNOSIS — L97412 Non-pressure chronic ulcer of right heel and midfoot with fat layer exposed: Secondary | ICD-10-CM | POA: Diagnosis not present

## 2021-03-02 DIAGNOSIS — M6281 Muscle weakness (generalized): Secondary | ICD-10-CM | POA: Diagnosis not present

## 2021-03-02 DIAGNOSIS — R262 Difficulty in walking, not elsewhere classified: Secondary | ICD-10-CM | POA: Diagnosis not present

## 2021-03-02 DIAGNOSIS — R2681 Unsteadiness on feet: Secondary | ICD-10-CM | POA: Diagnosis not present

## 2021-03-02 DIAGNOSIS — L89620 Pressure ulcer of left heel, unstageable: Secondary | ICD-10-CM | POA: Diagnosis not present

## 2021-03-02 DIAGNOSIS — L89150 Pressure ulcer of sacral region, unstageable: Secondary | ICD-10-CM | POA: Diagnosis not present

## 2021-03-02 DIAGNOSIS — R41841 Cognitive communication deficit: Secondary | ICD-10-CM | POA: Diagnosis not present

## 2021-03-03 DIAGNOSIS — L89620 Pressure ulcer of left heel, unstageable: Secondary | ICD-10-CM | POA: Diagnosis not present

## 2021-03-03 DIAGNOSIS — L89120 Pressure ulcer of left upper back, unstageable: Secondary | ICD-10-CM | POA: Diagnosis not present

## 2021-03-03 DIAGNOSIS — R41841 Cognitive communication deficit: Secondary | ICD-10-CM | POA: Diagnosis not present

## 2021-03-03 DIAGNOSIS — M6281 Muscle weakness (generalized): Secondary | ICD-10-CM | POA: Diagnosis not present

## 2021-03-03 DIAGNOSIS — L89150 Pressure ulcer of sacral region, unstageable: Secondary | ICD-10-CM | POA: Diagnosis not present

## 2021-03-03 DIAGNOSIS — L89610 Pressure ulcer of right heel, unstageable: Secondary | ICD-10-CM | POA: Diagnosis not present

## 2021-03-03 DIAGNOSIS — L89613 Pressure ulcer of right heel, stage 3: Secondary | ICD-10-CM | POA: Diagnosis not present

## 2021-03-03 DIAGNOSIS — R262 Difficulty in walking, not elsewhere classified: Secondary | ICD-10-CM | POA: Diagnosis not present

## 2021-03-03 DIAGNOSIS — R2681 Unsteadiness on feet: Secondary | ICD-10-CM | POA: Diagnosis not present

## 2021-03-03 DIAGNOSIS — L97412 Non-pressure chronic ulcer of right heel and midfoot with fat layer exposed: Secondary | ICD-10-CM | POA: Diagnosis not present

## 2021-03-05 DIAGNOSIS — R262 Difficulty in walking, not elsewhere classified: Secondary | ICD-10-CM | POA: Diagnosis not present

## 2021-03-05 DIAGNOSIS — L89620 Pressure ulcer of left heel, unstageable: Secondary | ICD-10-CM | POA: Diagnosis not present

## 2021-03-05 DIAGNOSIS — L89120 Pressure ulcer of left upper back, unstageable: Secondary | ICD-10-CM | POA: Diagnosis not present

## 2021-03-05 DIAGNOSIS — L89610 Pressure ulcer of right heel, unstageable: Secondary | ICD-10-CM | POA: Diagnosis not present

## 2021-03-05 DIAGNOSIS — R2681 Unsteadiness on feet: Secondary | ICD-10-CM | POA: Diagnosis not present

## 2021-03-05 DIAGNOSIS — R41841 Cognitive communication deficit: Secondary | ICD-10-CM | POA: Diagnosis not present

## 2021-03-05 DIAGNOSIS — L97412 Non-pressure chronic ulcer of right heel and midfoot with fat layer exposed: Secondary | ICD-10-CM | POA: Diagnosis not present

## 2021-03-05 DIAGNOSIS — L89150 Pressure ulcer of sacral region, unstageable: Secondary | ICD-10-CM | POA: Diagnosis not present

## 2021-03-05 DIAGNOSIS — M6281 Muscle weakness (generalized): Secondary | ICD-10-CM | POA: Diagnosis not present

## 2021-03-06 DIAGNOSIS — L97412 Non-pressure chronic ulcer of right heel and midfoot with fat layer exposed: Secondary | ICD-10-CM | POA: Diagnosis not present

## 2021-03-06 DIAGNOSIS — L89610 Pressure ulcer of right heel, unstageable: Secondary | ICD-10-CM | POA: Diagnosis not present

## 2021-03-06 DIAGNOSIS — L89150 Pressure ulcer of sacral region, unstageable: Secondary | ICD-10-CM | POA: Diagnosis not present

## 2021-03-06 DIAGNOSIS — R262 Difficulty in walking, not elsewhere classified: Secondary | ICD-10-CM | POA: Diagnosis not present

## 2021-03-06 DIAGNOSIS — M6281 Muscle weakness (generalized): Secondary | ICD-10-CM | POA: Diagnosis not present

## 2021-03-06 DIAGNOSIS — R41841 Cognitive communication deficit: Secondary | ICD-10-CM | POA: Diagnosis not present

## 2021-03-06 DIAGNOSIS — R2681 Unsteadiness on feet: Secondary | ICD-10-CM | POA: Diagnosis not present

## 2021-03-06 DIAGNOSIS — L89620 Pressure ulcer of left heel, unstageable: Secondary | ICD-10-CM | POA: Diagnosis not present

## 2021-03-06 DIAGNOSIS — L89120 Pressure ulcer of left upper back, unstageable: Secondary | ICD-10-CM | POA: Diagnosis not present

## 2021-03-07 DIAGNOSIS — L89150 Pressure ulcer of sacral region, unstageable: Secondary | ICD-10-CM | POA: Diagnosis not present

## 2021-03-07 DIAGNOSIS — R2681 Unsteadiness on feet: Secondary | ICD-10-CM | POA: Diagnosis not present

## 2021-03-07 DIAGNOSIS — B353 Tinea pedis: Secondary | ICD-10-CM | POA: Diagnosis not present

## 2021-03-07 DIAGNOSIS — R262 Difficulty in walking, not elsewhere classified: Secondary | ICD-10-CM | POA: Diagnosis not present

## 2021-03-07 DIAGNOSIS — F1194 Opioid use, unspecified with opioid-induced mood disorder: Secondary | ICD-10-CM | POA: Diagnosis not present

## 2021-03-07 DIAGNOSIS — L89620 Pressure ulcer of left heel, unstageable: Secondary | ICD-10-CM | POA: Diagnosis not present

## 2021-03-07 DIAGNOSIS — L97412 Non-pressure chronic ulcer of right heel and midfoot with fat layer exposed: Secondary | ICD-10-CM | POA: Diagnosis not present

## 2021-03-07 DIAGNOSIS — F064 Anxiety disorder due to known physiological condition: Secondary | ICD-10-CM | POA: Diagnosis not present

## 2021-03-07 DIAGNOSIS — B351 Tinea unguium: Secondary | ICD-10-CM | POA: Diagnosis not present

## 2021-03-07 DIAGNOSIS — R41841 Cognitive communication deficit: Secondary | ICD-10-CM | POA: Diagnosis not present

## 2021-03-07 DIAGNOSIS — L89120 Pressure ulcer of left upper back, unstageable: Secondary | ICD-10-CM | POA: Diagnosis not present

## 2021-03-07 DIAGNOSIS — L309 Dermatitis, unspecified: Secondary | ICD-10-CM | POA: Diagnosis not present

## 2021-03-07 DIAGNOSIS — F331 Major depressive disorder, recurrent, moderate: Secondary | ICD-10-CM | POA: Diagnosis not present

## 2021-03-07 DIAGNOSIS — I739 Peripheral vascular disease, unspecified: Secondary | ICD-10-CM | POA: Diagnosis not present

## 2021-03-07 DIAGNOSIS — L89610 Pressure ulcer of right heel, unstageable: Secondary | ICD-10-CM | POA: Diagnosis not present

## 2021-03-07 DIAGNOSIS — M6281 Muscle weakness (generalized): Secondary | ICD-10-CM | POA: Diagnosis not present

## 2021-03-08 DIAGNOSIS — L89613 Pressure ulcer of right heel, stage 3: Secondary | ICD-10-CM | POA: Diagnosis not present

## 2021-03-09 DIAGNOSIS — M6281 Muscle weakness (generalized): Secondary | ICD-10-CM | POA: Diagnosis not present

## 2021-03-09 DIAGNOSIS — R2681 Unsteadiness on feet: Secondary | ICD-10-CM | POA: Diagnosis not present

## 2021-03-09 DIAGNOSIS — L89120 Pressure ulcer of left upper back, unstageable: Secondary | ICD-10-CM | POA: Diagnosis not present

## 2021-03-09 DIAGNOSIS — R1312 Dysphagia, oropharyngeal phase: Secondary | ICD-10-CM | POA: Diagnosis not present

## 2021-03-09 DIAGNOSIS — L89150 Pressure ulcer of sacral region, unstageable: Secondary | ICD-10-CM | POA: Diagnosis not present

## 2021-03-09 DIAGNOSIS — F331 Major depressive disorder, recurrent, moderate: Secondary | ICD-10-CM | POA: Diagnosis not present

## 2021-03-09 DIAGNOSIS — E785 Hyperlipidemia, unspecified: Secondary | ICD-10-CM | POA: Diagnosis not present

## 2021-03-09 DIAGNOSIS — Z7151 Drug abuse counseling and surveillance of drug abuser: Secondary | ICD-10-CM | POA: Diagnosis not present

## 2021-03-09 DIAGNOSIS — E46 Unspecified protein-calorie malnutrition: Secondary | ICD-10-CM | POA: Diagnosis not present

## 2021-03-09 DIAGNOSIS — R4182 Altered mental status, unspecified: Secondary | ICD-10-CM | POA: Diagnosis not present

## 2021-03-09 DIAGNOSIS — E118 Type 2 diabetes mellitus with unspecified complications: Secondary | ICD-10-CM | POA: Diagnosis not present

## 2021-03-09 DIAGNOSIS — L89154 Pressure ulcer of sacral region, stage 4: Secondary | ICD-10-CM | POA: Diagnosis not present

## 2021-03-09 DIAGNOSIS — R262 Difficulty in walking, not elsewhere classified: Secondary | ICD-10-CM | POA: Diagnosis not present

## 2021-03-09 DIAGNOSIS — L89610 Pressure ulcer of right heel, unstageable: Secondary | ICD-10-CM | POA: Diagnosis not present

## 2021-03-09 DIAGNOSIS — R41841 Cognitive communication deficit: Secondary | ICD-10-CM | POA: Diagnosis not present

## 2021-03-09 DIAGNOSIS — I739 Peripheral vascular disease, unspecified: Secondary | ICD-10-CM | POA: Diagnosis not present

## 2021-03-10 DIAGNOSIS — L89154 Pressure ulcer of sacral region, stage 4: Secondary | ICD-10-CM | POA: Diagnosis not present

## 2021-03-10 DIAGNOSIS — L89610 Pressure ulcer of right heel, unstageable: Secondary | ICD-10-CM | POA: Diagnosis not present

## 2021-03-10 DIAGNOSIS — R1312 Dysphagia, oropharyngeal phase: Secondary | ICD-10-CM | POA: Diagnosis not present

## 2021-03-10 DIAGNOSIS — L89150 Pressure ulcer of sacral region, unstageable: Secondary | ICD-10-CM | POA: Diagnosis not present

## 2021-03-10 DIAGNOSIS — M6281 Muscle weakness (generalized): Secondary | ICD-10-CM | POA: Diagnosis not present

## 2021-03-10 DIAGNOSIS — R262 Difficulty in walking, not elsewhere classified: Secondary | ICD-10-CM | POA: Diagnosis not present

## 2021-03-10 DIAGNOSIS — R2681 Unsteadiness on feet: Secondary | ICD-10-CM | POA: Diagnosis not present

## 2021-03-10 DIAGNOSIS — L89120 Pressure ulcer of left upper back, unstageable: Secondary | ICD-10-CM | POA: Diagnosis not present

## 2021-03-10 DIAGNOSIS — R41841 Cognitive communication deficit: Secondary | ICD-10-CM | POA: Diagnosis not present

## 2021-03-13 DIAGNOSIS — L89610 Pressure ulcer of right heel, unstageable: Secondary | ICD-10-CM | POA: Diagnosis not present

## 2021-03-13 DIAGNOSIS — L89120 Pressure ulcer of left upper back, unstageable: Secondary | ICD-10-CM | POA: Diagnosis not present

## 2021-03-13 DIAGNOSIS — M6281 Muscle weakness (generalized): Secondary | ICD-10-CM | POA: Diagnosis not present

## 2021-03-13 DIAGNOSIS — L89150 Pressure ulcer of sacral region, unstageable: Secondary | ICD-10-CM | POA: Diagnosis not present

## 2021-03-13 DIAGNOSIS — R2681 Unsteadiness on feet: Secondary | ICD-10-CM | POA: Diagnosis not present

## 2021-03-13 DIAGNOSIS — R41841 Cognitive communication deficit: Secondary | ICD-10-CM | POA: Diagnosis not present

## 2021-03-13 DIAGNOSIS — L89154 Pressure ulcer of sacral region, stage 4: Secondary | ICD-10-CM | POA: Diagnosis not present

## 2021-03-13 DIAGNOSIS — R1312 Dysphagia, oropharyngeal phase: Secondary | ICD-10-CM | POA: Diagnosis not present

## 2021-03-13 DIAGNOSIS — R262 Difficulty in walking, not elsewhere classified: Secondary | ICD-10-CM | POA: Diagnosis not present

## 2021-03-14 DIAGNOSIS — M6281 Muscle weakness (generalized): Secondary | ICD-10-CM | POA: Diagnosis not present

## 2021-03-14 DIAGNOSIS — R1312 Dysphagia, oropharyngeal phase: Secondary | ICD-10-CM | POA: Diagnosis not present

## 2021-03-14 DIAGNOSIS — L89120 Pressure ulcer of left upper back, unstageable: Secondary | ICD-10-CM | POA: Diagnosis not present

## 2021-03-14 DIAGNOSIS — L89610 Pressure ulcer of right heel, unstageable: Secondary | ICD-10-CM | POA: Diagnosis not present

## 2021-03-14 DIAGNOSIS — L89150 Pressure ulcer of sacral region, unstageable: Secondary | ICD-10-CM | POA: Diagnosis not present

## 2021-03-14 DIAGNOSIS — L89154 Pressure ulcer of sacral region, stage 4: Secondary | ICD-10-CM | POA: Diagnosis not present

## 2021-03-14 DIAGNOSIS — R262 Difficulty in walking, not elsewhere classified: Secondary | ICD-10-CM | POA: Diagnosis not present

## 2021-03-14 DIAGNOSIS — R2681 Unsteadiness on feet: Secondary | ICD-10-CM | POA: Diagnosis not present

## 2021-03-14 DIAGNOSIS — R41841 Cognitive communication deficit: Secondary | ICD-10-CM | POA: Diagnosis not present

## 2021-03-15 DIAGNOSIS — L89613 Pressure ulcer of right heel, stage 3: Secondary | ICD-10-CM | POA: Diagnosis not present

## 2021-03-15 DIAGNOSIS — E118 Type 2 diabetes mellitus with unspecified complications: Secondary | ICD-10-CM | POA: Diagnosis not present

## 2021-03-15 DIAGNOSIS — L89120 Pressure ulcer of left upper back, unstageable: Secondary | ICD-10-CM | POA: Diagnosis not present

## 2021-03-15 DIAGNOSIS — R1312 Dysphagia, oropharyngeal phase: Secondary | ICD-10-CM | POA: Diagnosis not present

## 2021-03-15 DIAGNOSIS — M6281 Muscle weakness (generalized): Secondary | ICD-10-CM | POA: Diagnosis not present

## 2021-03-15 DIAGNOSIS — L89154 Pressure ulcer of sacral region, stage 4: Secondary | ICD-10-CM | POA: Diagnosis not present

## 2021-03-15 DIAGNOSIS — R262 Difficulty in walking, not elsewhere classified: Secondary | ICD-10-CM | POA: Diagnosis not present

## 2021-03-15 DIAGNOSIS — L89150 Pressure ulcer of sacral region, unstageable: Secondary | ICD-10-CM | POA: Diagnosis not present

## 2021-03-15 DIAGNOSIS — L89610 Pressure ulcer of right heel, unstageable: Secondary | ICD-10-CM | POA: Diagnosis not present

## 2021-03-15 DIAGNOSIS — R41841 Cognitive communication deficit: Secondary | ICD-10-CM | POA: Diagnosis not present

## 2021-03-15 DIAGNOSIS — F331 Major depressive disorder, recurrent, moderate: Secondary | ICD-10-CM | POA: Diagnosis not present

## 2021-03-15 DIAGNOSIS — R2681 Unsteadiness on feet: Secondary | ICD-10-CM | POA: Diagnosis not present

## 2021-03-21 DIAGNOSIS — F1194 Opioid use, unspecified with opioid-induced mood disorder: Secondary | ICD-10-CM | POA: Diagnosis not present

## 2021-03-21 DIAGNOSIS — F064 Anxiety disorder due to known physiological condition: Secondary | ICD-10-CM | POA: Diagnosis not present

## 2021-03-21 DIAGNOSIS — F331 Major depressive disorder, recurrent, moderate: Secondary | ICD-10-CM | POA: Diagnosis not present

## 2021-03-21 DIAGNOSIS — E1140 Type 2 diabetes mellitus with diabetic neuropathy, unspecified: Secondary | ICD-10-CM | POA: Diagnosis not present

## 2021-04-04 DIAGNOSIS — F331 Major depressive disorder, recurrent, moderate: Secondary | ICD-10-CM | POA: Diagnosis not present

## 2021-04-04 DIAGNOSIS — F1194 Opioid use, unspecified with opioid-induced mood disorder: Secondary | ICD-10-CM | POA: Diagnosis not present

## 2021-04-04 DIAGNOSIS — F064 Anxiety disorder due to known physiological condition: Secondary | ICD-10-CM | POA: Diagnosis not present

## 2021-04-12 DIAGNOSIS — M25512 Pain in left shoulder: Secondary | ICD-10-CM | POA: Diagnosis not present

## 2021-04-12 DIAGNOSIS — W19XXXA Unspecified fall, initial encounter: Secondary | ICD-10-CM | POA: Diagnosis not present

## 2021-04-12 DIAGNOSIS — M25511 Pain in right shoulder: Secondary | ICD-10-CM | POA: Diagnosis not present

## 2021-04-12 DIAGNOSIS — M6281 Muscle weakness (generalized): Secondary | ICD-10-CM | POA: Diagnosis not present

## 2021-04-12 DIAGNOSIS — M25519 Pain in unspecified shoulder: Secondary | ICD-10-CM | POA: Diagnosis not present

## 2021-04-13 DIAGNOSIS — L89154 Pressure ulcer of sacral region, stage 4: Secondary | ICD-10-CM | POA: Diagnosis not present

## 2021-04-13 DIAGNOSIS — R41841 Cognitive communication deficit: Secondary | ICD-10-CM | POA: Diagnosis not present

## 2021-04-13 DIAGNOSIS — N1831 Chronic kidney disease, stage 3a: Secondary | ICD-10-CM | POA: Diagnosis not present

## 2021-04-13 DIAGNOSIS — L89120 Pressure ulcer of left upper back, unstageable: Secondary | ICD-10-CM | POA: Diagnosis not present

## 2021-04-13 DIAGNOSIS — L89150 Pressure ulcer of sacral region, unstageable: Secondary | ICD-10-CM | POA: Diagnosis not present

## 2021-04-13 DIAGNOSIS — Z741 Need for assistance with personal care: Secondary | ICD-10-CM | POA: Diagnosis not present

## 2021-04-13 DIAGNOSIS — R2681 Unsteadiness on feet: Secondary | ICD-10-CM | POA: Diagnosis not present

## 2021-04-13 DIAGNOSIS — R262 Difficulty in walking, not elsewhere classified: Secondary | ICD-10-CM | POA: Diagnosis not present

## 2021-04-13 DIAGNOSIS — M6281 Muscle weakness (generalized): Secondary | ICD-10-CM | POA: Diagnosis not present

## 2021-04-18 DIAGNOSIS — N1831 Chronic kidney disease, stage 3a: Secondary | ICD-10-CM | POA: Diagnosis not present

## 2021-04-18 DIAGNOSIS — F064 Anxiety disorder due to known physiological condition: Secondary | ICD-10-CM | POA: Diagnosis not present

## 2021-04-18 DIAGNOSIS — R262 Difficulty in walking, not elsewhere classified: Secondary | ICD-10-CM | POA: Diagnosis not present

## 2021-04-18 DIAGNOSIS — L89120 Pressure ulcer of left upper back, unstageable: Secondary | ICD-10-CM | POA: Diagnosis not present

## 2021-04-18 DIAGNOSIS — R2681 Unsteadiness on feet: Secondary | ICD-10-CM | POA: Diagnosis not present

## 2021-04-18 DIAGNOSIS — F331 Major depressive disorder, recurrent, moderate: Secondary | ICD-10-CM | POA: Diagnosis not present

## 2021-04-18 DIAGNOSIS — L89154 Pressure ulcer of sacral region, stage 4: Secondary | ICD-10-CM | POA: Diagnosis not present

## 2021-04-18 DIAGNOSIS — M6281 Muscle weakness (generalized): Secondary | ICD-10-CM | POA: Diagnosis not present

## 2021-04-18 DIAGNOSIS — R41841 Cognitive communication deficit: Secondary | ICD-10-CM | POA: Diagnosis not present

## 2021-04-18 DIAGNOSIS — L89150 Pressure ulcer of sacral region, unstageable: Secondary | ICD-10-CM | POA: Diagnosis not present

## 2021-04-18 DIAGNOSIS — Z741 Need for assistance with personal care: Secondary | ICD-10-CM | POA: Diagnosis not present

## 2021-04-18 DIAGNOSIS — F1194 Opioid use, unspecified with opioid-induced mood disorder: Secondary | ICD-10-CM | POA: Diagnosis not present

## 2021-04-19 DIAGNOSIS — L89150 Pressure ulcer of sacral region, unstageable: Secondary | ICD-10-CM | POA: Diagnosis not present

## 2021-04-19 DIAGNOSIS — R41841 Cognitive communication deficit: Secondary | ICD-10-CM | POA: Diagnosis not present

## 2021-04-19 DIAGNOSIS — N1831 Chronic kidney disease, stage 3a: Secondary | ICD-10-CM | POA: Diagnosis not present

## 2021-04-19 DIAGNOSIS — F331 Major depressive disorder, recurrent, moderate: Secondary | ICD-10-CM | POA: Diagnosis not present

## 2021-04-19 DIAGNOSIS — G4701 Insomnia due to medical condition: Secondary | ICD-10-CM | POA: Diagnosis not present

## 2021-04-19 DIAGNOSIS — F1194 Opioid use, unspecified with opioid-induced mood disorder: Secondary | ICD-10-CM | POA: Diagnosis not present

## 2021-04-19 DIAGNOSIS — Z741 Need for assistance with personal care: Secondary | ICD-10-CM | POA: Diagnosis not present

## 2021-04-19 DIAGNOSIS — M6281 Muscle weakness (generalized): Secondary | ICD-10-CM | POA: Diagnosis not present

## 2021-04-19 DIAGNOSIS — L89154 Pressure ulcer of sacral region, stage 4: Secondary | ICD-10-CM | POA: Diagnosis not present

## 2021-04-19 DIAGNOSIS — R2681 Unsteadiness on feet: Secondary | ICD-10-CM | POA: Diagnosis not present

## 2021-04-19 DIAGNOSIS — R262 Difficulty in walking, not elsewhere classified: Secondary | ICD-10-CM | POA: Diagnosis not present

## 2021-04-19 DIAGNOSIS — F064 Anxiety disorder due to known physiological condition: Secondary | ICD-10-CM | POA: Diagnosis not present

## 2021-04-19 DIAGNOSIS — L89120 Pressure ulcer of left upper back, unstageable: Secondary | ICD-10-CM | POA: Diagnosis not present

## 2021-04-20 DIAGNOSIS — R262 Difficulty in walking, not elsewhere classified: Secondary | ICD-10-CM | POA: Diagnosis not present

## 2021-04-20 DIAGNOSIS — M6281 Muscle weakness (generalized): Secondary | ICD-10-CM | POA: Diagnosis not present

## 2021-04-20 DIAGNOSIS — N1831 Chronic kidney disease, stage 3a: Secondary | ICD-10-CM | POA: Diagnosis not present

## 2021-04-20 DIAGNOSIS — F331 Major depressive disorder, recurrent, moderate: Secondary | ICD-10-CM | POA: Diagnosis not present

## 2021-04-20 DIAGNOSIS — Z741 Need for assistance with personal care: Secondary | ICD-10-CM | POA: Diagnosis not present

## 2021-04-20 DIAGNOSIS — L03114 Cellulitis of left upper limb: Secondary | ICD-10-CM | POA: Diagnosis not present

## 2021-04-20 DIAGNOSIS — L89150 Pressure ulcer of sacral region, unstageable: Secondary | ICD-10-CM | POA: Diagnosis not present

## 2021-04-20 DIAGNOSIS — L89154 Pressure ulcer of sacral region, stage 4: Secondary | ICD-10-CM | POA: Diagnosis not present

## 2021-04-20 DIAGNOSIS — M25519 Pain in unspecified shoulder: Secondary | ICD-10-CM | POA: Diagnosis not present

## 2021-04-20 DIAGNOSIS — L89120 Pressure ulcer of left upper back, unstageable: Secondary | ICD-10-CM | POA: Diagnosis not present

## 2021-04-20 DIAGNOSIS — R2681 Unsteadiness on feet: Secondary | ICD-10-CM | POA: Diagnosis not present

## 2021-04-20 DIAGNOSIS — R41841 Cognitive communication deficit: Secondary | ICD-10-CM | POA: Diagnosis not present

## 2021-04-21 DIAGNOSIS — R41841 Cognitive communication deficit: Secondary | ICD-10-CM | POA: Diagnosis not present

## 2021-04-21 DIAGNOSIS — R262 Difficulty in walking, not elsewhere classified: Secondary | ICD-10-CM | POA: Diagnosis not present

## 2021-04-21 DIAGNOSIS — L89150 Pressure ulcer of sacral region, unstageable: Secondary | ICD-10-CM | POA: Diagnosis not present

## 2021-04-21 DIAGNOSIS — N1831 Chronic kidney disease, stage 3a: Secondary | ICD-10-CM | POA: Diagnosis not present

## 2021-04-21 DIAGNOSIS — L89154 Pressure ulcer of sacral region, stage 4: Secondary | ICD-10-CM | POA: Diagnosis not present

## 2021-04-21 DIAGNOSIS — L89120 Pressure ulcer of left upper back, unstageable: Secondary | ICD-10-CM | POA: Diagnosis not present

## 2021-04-21 DIAGNOSIS — R2681 Unsteadiness on feet: Secondary | ICD-10-CM | POA: Diagnosis not present

## 2021-04-21 DIAGNOSIS — Z741 Need for assistance with personal care: Secondary | ICD-10-CM | POA: Diagnosis not present

## 2021-04-21 DIAGNOSIS — M6281 Muscle weakness (generalized): Secondary | ICD-10-CM | POA: Diagnosis not present

## 2021-04-24 DIAGNOSIS — N1831 Chronic kidney disease, stage 3a: Secondary | ICD-10-CM | POA: Diagnosis not present

## 2021-04-24 DIAGNOSIS — R41841 Cognitive communication deficit: Secondary | ICD-10-CM | POA: Diagnosis not present

## 2021-04-24 DIAGNOSIS — L89154 Pressure ulcer of sacral region, stage 4: Secondary | ICD-10-CM | POA: Diagnosis not present

## 2021-04-24 DIAGNOSIS — L89120 Pressure ulcer of left upper back, unstageable: Secondary | ICD-10-CM | POA: Diagnosis not present

## 2021-04-24 DIAGNOSIS — M6281 Muscle weakness (generalized): Secondary | ICD-10-CM | POA: Diagnosis not present

## 2021-04-24 DIAGNOSIS — L89150 Pressure ulcer of sacral region, unstageable: Secondary | ICD-10-CM | POA: Diagnosis not present

## 2021-04-24 DIAGNOSIS — R262 Difficulty in walking, not elsewhere classified: Secondary | ICD-10-CM | POA: Diagnosis not present

## 2021-04-24 DIAGNOSIS — Z741 Need for assistance with personal care: Secondary | ICD-10-CM | POA: Diagnosis not present

## 2021-04-24 DIAGNOSIS — R2681 Unsteadiness on feet: Secondary | ICD-10-CM | POA: Diagnosis not present

## 2021-04-25 ENCOUNTER — Encounter: Payer: Self-pay | Admitting: General Surgery

## 2021-04-25 DIAGNOSIS — L89150 Pressure ulcer of sacral region, unstageable: Secondary | ICD-10-CM | POA: Diagnosis not present

## 2021-04-25 DIAGNOSIS — N1831 Chronic kidney disease, stage 3a: Secondary | ICD-10-CM | POA: Diagnosis not present

## 2021-04-25 DIAGNOSIS — Z741 Need for assistance with personal care: Secondary | ICD-10-CM | POA: Diagnosis not present

## 2021-04-25 DIAGNOSIS — M6281 Muscle weakness (generalized): Secondary | ICD-10-CM | POA: Diagnosis not present

## 2021-04-25 DIAGNOSIS — R41841 Cognitive communication deficit: Secondary | ICD-10-CM | POA: Diagnosis not present

## 2021-04-25 DIAGNOSIS — Z23 Encounter for immunization: Secondary | ICD-10-CM | POA: Diagnosis not present

## 2021-04-25 DIAGNOSIS — L89120 Pressure ulcer of left upper back, unstageable: Secondary | ICD-10-CM | POA: Diagnosis not present

## 2021-04-25 DIAGNOSIS — R2681 Unsteadiness on feet: Secondary | ICD-10-CM | POA: Diagnosis not present

## 2021-04-25 DIAGNOSIS — L89154 Pressure ulcer of sacral region, stage 4: Secondary | ICD-10-CM | POA: Diagnosis not present

## 2021-04-25 DIAGNOSIS — R262 Difficulty in walking, not elsewhere classified: Secondary | ICD-10-CM | POA: Diagnosis not present

## 2021-04-26 DIAGNOSIS — M6281 Muscle weakness (generalized): Secondary | ICD-10-CM | POA: Diagnosis not present

## 2021-04-26 DIAGNOSIS — N1831 Chronic kidney disease, stage 3a: Secondary | ICD-10-CM | POA: Diagnosis not present

## 2021-04-26 DIAGNOSIS — Z741 Need for assistance with personal care: Secondary | ICD-10-CM | POA: Diagnosis not present

## 2021-04-26 DIAGNOSIS — L89120 Pressure ulcer of left upper back, unstageable: Secondary | ICD-10-CM | POA: Diagnosis not present

## 2021-04-26 DIAGNOSIS — R41841 Cognitive communication deficit: Secondary | ICD-10-CM | POA: Diagnosis not present

## 2021-04-26 DIAGNOSIS — R262 Difficulty in walking, not elsewhere classified: Secondary | ICD-10-CM | POA: Diagnosis not present

## 2021-04-26 DIAGNOSIS — R2681 Unsteadiness on feet: Secondary | ICD-10-CM | POA: Diagnosis not present

## 2021-04-26 DIAGNOSIS — L89154 Pressure ulcer of sacral region, stage 4: Secondary | ICD-10-CM | POA: Diagnosis not present

## 2021-04-26 DIAGNOSIS — L89150 Pressure ulcer of sacral region, unstageable: Secondary | ICD-10-CM | POA: Diagnosis not present

## 2021-04-27 DIAGNOSIS — Z741 Need for assistance with personal care: Secondary | ICD-10-CM | POA: Diagnosis not present

## 2021-04-27 DIAGNOSIS — M6281 Muscle weakness (generalized): Secondary | ICD-10-CM | POA: Diagnosis not present

## 2021-04-27 DIAGNOSIS — R2681 Unsteadiness on feet: Secondary | ICD-10-CM | POA: Diagnosis not present

## 2021-04-27 DIAGNOSIS — L89120 Pressure ulcer of left upper back, unstageable: Secondary | ICD-10-CM | POA: Diagnosis not present

## 2021-04-27 DIAGNOSIS — L89154 Pressure ulcer of sacral region, stage 4: Secondary | ICD-10-CM | POA: Diagnosis not present

## 2021-04-27 DIAGNOSIS — L89150 Pressure ulcer of sacral region, unstageable: Secondary | ICD-10-CM | POA: Diagnosis not present

## 2021-04-27 DIAGNOSIS — R41841 Cognitive communication deficit: Secondary | ICD-10-CM | POA: Diagnosis not present

## 2021-04-27 DIAGNOSIS — R262 Difficulty in walking, not elsewhere classified: Secondary | ICD-10-CM | POA: Diagnosis not present

## 2021-04-27 DIAGNOSIS — N1831 Chronic kidney disease, stage 3a: Secondary | ICD-10-CM | POA: Diagnosis not present

## 2021-04-28 DIAGNOSIS — Z741 Need for assistance with personal care: Secondary | ICD-10-CM | POA: Diagnosis not present

## 2021-04-28 DIAGNOSIS — N1831 Chronic kidney disease, stage 3a: Secondary | ICD-10-CM | POA: Diagnosis not present

## 2021-04-28 DIAGNOSIS — R2681 Unsteadiness on feet: Secondary | ICD-10-CM | POA: Diagnosis not present

## 2021-04-28 DIAGNOSIS — L89154 Pressure ulcer of sacral region, stage 4: Secondary | ICD-10-CM | POA: Diagnosis not present

## 2021-04-28 DIAGNOSIS — L89120 Pressure ulcer of left upper back, unstageable: Secondary | ICD-10-CM | POA: Diagnosis not present

## 2021-04-28 DIAGNOSIS — M6281 Muscle weakness (generalized): Secondary | ICD-10-CM | POA: Diagnosis not present

## 2021-04-28 DIAGNOSIS — R262 Difficulty in walking, not elsewhere classified: Secondary | ICD-10-CM | POA: Diagnosis not present

## 2021-04-28 DIAGNOSIS — L89150 Pressure ulcer of sacral region, unstageable: Secondary | ICD-10-CM | POA: Diagnosis not present

## 2021-04-28 DIAGNOSIS — R41841 Cognitive communication deficit: Secondary | ICD-10-CM | POA: Diagnosis not present

## 2021-05-01 DIAGNOSIS — M6281 Muscle weakness (generalized): Secondary | ICD-10-CM | POA: Diagnosis not present

## 2021-05-01 DIAGNOSIS — N1831 Chronic kidney disease, stage 3a: Secondary | ICD-10-CM | POA: Diagnosis not present

## 2021-05-01 DIAGNOSIS — L89154 Pressure ulcer of sacral region, stage 4: Secondary | ICD-10-CM | POA: Diagnosis not present

## 2021-05-01 DIAGNOSIS — L89150 Pressure ulcer of sacral region, unstageable: Secondary | ICD-10-CM | POA: Diagnosis not present

## 2021-05-01 DIAGNOSIS — L89120 Pressure ulcer of left upper back, unstageable: Secondary | ICD-10-CM | POA: Diagnosis not present

## 2021-05-01 DIAGNOSIS — R2681 Unsteadiness on feet: Secondary | ICD-10-CM | POA: Diagnosis not present

## 2021-05-01 DIAGNOSIS — Z741 Need for assistance with personal care: Secondary | ICD-10-CM | POA: Diagnosis not present

## 2021-05-01 DIAGNOSIS — R262 Difficulty in walking, not elsewhere classified: Secondary | ICD-10-CM | POA: Diagnosis not present

## 2021-05-01 DIAGNOSIS — R41841 Cognitive communication deficit: Secondary | ICD-10-CM | POA: Diagnosis not present

## 2021-05-10 DIAGNOSIS — N390 Urinary tract infection, site not specified: Secondary | ICD-10-CM | POA: Diagnosis not present

## 2021-05-11 DIAGNOSIS — E785 Hyperlipidemia, unspecified: Secondary | ICD-10-CM | POA: Diagnosis not present

## 2021-05-11 DIAGNOSIS — F331 Major depressive disorder, recurrent, moderate: Secondary | ICD-10-CM | POA: Diagnosis not present

## 2021-05-11 DIAGNOSIS — E46 Unspecified protein-calorie malnutrition: Secondary | ICD-10-CM | POA: Diagnosis not present

## 2021-05-11 DIAGNOSIS — R2681 Unsteadiness on feet: Secondary | ICD-10-CM | POA: Diagnosis not present

## 2021-05-11 DIAGNOSIS — I10 Essential (primary) hypertension: Secondary | ICD-10-CM | POA: Diagnosis not present

## 2021-05-11 DIAGNOSIS — Z7151 Drug abuse counseling and surveillance of drug abuser: Secondary | ICD-10-CM | POA: Diagnosis not present

## 2021-05-11 DIAGNOSIS — E118 Type 2 diabetes mellitus with unspecified complications: Secondary | ICD-10-CM | POA: Diagnosis not present

## 2021-05-11 DIAGNOSIS — I739 Peripheral vascular disease, unspecified: Secondary | ICD-10-CM | POA: Diagnosis not present

## 2021-05-16 DIAGNOSIS — F1194 Opioid use, unspecified with opioid-induced mood disorder: Secondary | ICD-10-CM | POA: Diagnosis not present

## 2021-05-16 DIAGNOSIS — N390 Urinary tract infection, site not specified: Secondary | ICD-10-CM | POA: Diagnosis not present

## 2021-05-16 DIAGNOSIS — F331 Major depressive disorder, recurrent, moderate: Secondary | ICD-10-CM | POA: Diagnosis not present

## 2021-05-16 DIAGNOSIS — B9620 Unspecified Escherichia coli [E. coli] as the cause of diseases classified elsewhere: Secondary | ICD-10-CM | POA: Diagnosis not present

## 2021-05-16 DIAGNOSIS — F064 Anxiety disorder due to known physiological condition: Secondary | ICD-10-CM | POA: Diagnosis not present

## 2021-05-17 DIAGNOSIS — F1194 Opioid use, unspecified with opioid-induced mood disorder: Secondary | ICD-10-CM | POA: Diagnosis not present

## 2021-05-17 DIAGNOSIS — G4701 Insomnia due to medical condition: Secondary | ICD-10-CM | POA: Diagnosis not present

## 2021-05-17 DIAGNOSIS — F331 Major depressive disorder, recurrent, moderate: Secondary | ICD-10-CM | POA: Diagnosis not present

## 2021-05-17 DIAGNOSIS — F064 Anxiety disorder due to known physiological condition: Secondary | ICD-10-CM | POA: Diagnosis not present

## 2021-05-19 DIAGNOSIS — E1140 Type 2 diabetes mellitus with diabetic neuropathy, unspecified: Secondary | ICD-10-CM | POA: Diagnosis not present

## 2021-05-19 DIAGNOSIS — I10 Essential (primary) hypertension: Secondary | ICD-10-CM | POA: Diagnosis not present

## 2021-05-19 DIAGNOSIS — E118 Type 2 diabetes mellitus with unspecified complications: Secondary | ICD-10-CM | POA: Diagnosis not present

## 2021-05-19 DIAGNOSIS — B9620 Unspecified Escherichia coli [E. coli] as the cause of diseases classified elsewhere: Secondary | ICD-10-CM | POA: Diagnosis not present

## 2021-05-19 DIAGNOSIS — I739 Peripheral vascular disease, unspecified: Secondary | ICD-10-CM | POA: Diagnosis not present

## 2021-05-25 DIAGNOSIS — M6259 Muscle wasting and atrophy, not elsewhere classified, multiple sites: Secondary | ICD-10-CM | POA: Diagnosis not present

## 2021-05-25 DIAGNOSIS — A419 Sepsis, unspecified organism: Secondary | ICD-10-CM | POA: Diagnosis not present

## 2021-05-25 DIAGNOSIS — R2681 Unsteadiness on feet: Secondary | ICD-10-CM | POA: Diagnosis not present

## 2021-05-25 DIAGNOSIS — Z8616 Personal history of COVID-19: Secondary | ICD-10-CM | POA: Diagnosis not present

## 2021-05-25 DIAGNOSIS — R262 Difficulty in walking, not elsewhere classified: Secondary | ICD-10-CM | POA: Diagnosis not present

## 2021-05-25 DIAGNOSIS — M6281 Muscle weakness (generalized): Secondary | ICD-10-CM | POA: Diagnosis not present

## 2021-05-25 DIAGNOSIS — N390 Urinary tract infection, site not specified: Secondary | ICD-10-CM | POA: Diagnosis not present

## 2021-05-25 DIAGNOSIS — R41841 Cognitive communication deficit: Secondary | ICD-10-CM | POA: Diagnosis not present

## 2021-05-25 DIAGNOSIS — L89120 Pressure ulcer of left upper back, unstageable: Secondary | ICD-10-CM | POA: Diagnosis not present

## 2021-05-25 DIAGNOSIS — I739 Peripheral vascular disease, unspecified: Secondary | ICD-10-CM | POA: Diagnosis not present

## 2021-05-28 DIAGNOSIS — Z8616 Personal history of COVID-19: Secondary | ICD-10-CM | POA: Diagnosis not present

## 2021-05-28 DIAGNOSIS — I739 Peripheral vascular disease, unspecified: Secondary | ICD-10-CM | POA: Diagnosis not present

## 2021-05-28 DIAGNOSIS — M6259 Muscle wasting and atrophy, not elsewhere classified, multiple sites: Secondary | ICD-10-CM | POA: Diagnosis not present

## 2021-05-28 DIAGNOSIS — R262 Difficulty in walking, not elsewhere classified: Secondary | ICD-10-CM | POA: Diagnosis not present

## 2021-05-28 DIAGNOSIS — R41841 Cognitive communication deficit: Secondary | ICD-10-CM | POA: Diagnosis not present

## 2021-05-28 DIAGNOSIS — R2681 Unsteadiness on feet: Secondary | ICD-10-CM | POA: Diagnosis not present

## 2021-05-28 DIAGNOSIS — A419 Sepsis, unspecified organism: Secondary | ICD-10-CM | POA: Diagnosis not present

## 2021-05-28 DIAGNOSIS — L89120 Pressure ulcer of left upper back, unstageable: Secondary | ICD-10-CM | POA: Diagnosis not present

## 2021-05-28 DIAGNOSIS — M6281 Muscle weakness (generalized): Secondary | ICD-10-CM | POA: Diagnosis not present

## 2021-05-29 DIAGNOSIS — R262 Difficulty in walking, not elsewhere classified: Secondary | ICD-10-CM | POA: Diagnosis not present

## 2021-05-29 DIAGNOSIS — A419 Sepsis, unspecified organism: Secondary | ICD-10-CM | POA: Diagnosis not present

## 2021-05-29 DIAGNOSIS — I739 Peripheral vascular disease, unspecified: Secondary | ICD-10-CM | POA: Diagnosis not present

## 2021-05-29 DIAGNOSIS — Z8616 Personal history of COVID-19: Secondary | ICD-10-CM | POA: Diagnosis not present

## 2021-05-29 DIAGNOSIS — R2681 Unsteadiness on feet: Secondary | ICD-10-CM | POA: Diagnosis not present

## 2021-05-29 DIAGNOSIS — M6259 Muscle wasting and atrophy, not elsewhere classified, multiple sites: Secondary | ICD-10-CM | POA: Diagnosis not present

## 2021-05-29 DIAGNOSIS — R41841 Cognitive communication deficit: Secondary | ICD-10-CM | POA: Diagnosis not present

## 2021-05-29 DIAGNOSIS — L89120 Pressure ulcer of left upper back, unstageable: Secondary | ICD-10-CM | POA: Diagnosis not present

## 2021-05-29 DIAGNOSIS — M6281 Muscle weakness (generalized): Secondary | ICD-10-CM | POA: Diagnosis not present

## 2021-05-30 DIAGNOSIS — R41841 Cognitive communication deficit: Secondary | ICD-10-CM | POA: Diagnosis not present

## 2021-05-30 DIAGNOSIS — R2681 Unsteadiness on feet: Secondary | ICD-10-CM | POA: Diagnosis not present

## 2021-05-30 DIAGNOSIS — L89120 Pressure ulcer of left upper back, unstageable: Secondary | ICD-10-CM | POA: Diagnosis not present

## 2021-05-30 DIAGNOSIS — M6259 Muscle wasting and atrophy, not elsewhere classified, multiple sites: Secondary | ICD-10-CM | POA: Diagnosis not present

## 2021-05-30 DIAGNOSIS — M6281 Muscle weakness (generalized): Secondary | ICD-10-CM | POA: Diagnosis not present

## 2021-05-30 DIAGNOSIS — F331 Major depressive disorder, recurrent, moderate: Secondary | ICD-10-CM | POA: Diagnosis not present

## 2021-05-30 DIAGNOSIS — Z8616 Personal history of COVID-19: Secondary | ICD-10-CM | POA: Diagnosis not present

## 2021-05-30 DIAGNOSIS — F1194 Opioid use, unspecified with opioid-induced mood disorder: Secondary | ICD-10-CM | POA: Diagnosis not present

## 2021-05-30 DIAGNOSIS — A419 Sepsis, unspecified organism: Secondary | ICD-10-CM | POA: Diagnosis not present

## 2021-05-30 DIAGNOSIS — I739 Peripheral vascular disease, unspecified: Secondary | ICD-10-CM | POA: Diagnosis not present

## 2021-05-30 DIAGNOSIS — F064 Anxiety disorder due to known physiological condition: Secondary | ICD-10-CM | POA: Diagnosis not present

## 2021-05-30 DIAGNOSIS — R262 Difficulty in walking, not elsewhere classified: Secondary | ICD-10-CM | POA: Diagnosis not present

## 2021-05-31 DIAGNOSIS — R41841 Cognitive communication deficit: Secondary | ICD-10-CM | POA: Diagnosis not present

## 2021-05-31 DIAGNOSIS — Z8616 Personal history of COVID-19: Secondary | ICD-10-CM | POA: Diagnosis not present

## 2021-05-31 DIAGNOSIS — M6281 Muscle weakness (generalized): Secondary | ICD-10-CM | POA: Diagnosis not present

## 2021-05-31 DIAGNOSIS — R2681 Unsteadiness on feet: Secondary | ICD-10-CM | POA: Diagnosis not present

## 2021-05-31 DIAGNOSIS — M6259 Muscle wasting and atrophy, not elsewhere classified, multiple sites: Secondary | ICD-10-CM | POA: Diagnosis not present

## 2021-05-31 DIAGNOSIS — I739 Peripheral vascular disease, unspecified: Secondary | ICD-10-CM | POA: Diagnosis not present

## 2021-05-31 DIAGNOSIS — R262 Difficulty in walking, not elsewhere classified: Secondary | ICD-10-CM | POA: Diagnosis not present

## 2021-05-31 DIAGNOSIS — L89120 Pressure ulcer of left upper back, unstageable: Secondary | ICD-10-CM | POA: Diagnosis not present

## 2021-05-31 DIAGNOSIS — A419 Sepsis, unspecified organism: Secondary | ICD-10-CM | POA: Diagnosis not present

## 2021-06-02 DIAGNOSIS — R262 Difficulty in walking, not elsewhere classified: Secondary | ICD-10-CM | POA: Diagnosis not present

## 2021-06-02 DIAGNOSIS — R41841 Cognitive communication deficit: Secondary | ICD-10-CM | POA: Diagnosis not present

## 2021-06-02 DIAGNOSIS — Z8616 Personal history of COVID-19: Secondary | ICD-10-CM | POA: Diagnosis not present

## 2021-06-02 DIAGNOSIS — M6281 Muscle weakness (generalized): Secondary | ICD-10-CM | POA: Diagnosis not present

## 2021-06-02 DIAGNOSIS — M6259 Muscle wasting and atrophy, not elsewhere classified, multiple sites: Secondary | ICD-10-CM | POA: Diagnosis not present

## 2021-06-02 DIAGNOSIS — A419 Sepsis, unspecified organism: Secondary | ICD-10-CM | POA: Diagnosis not present

## 2021-06-02 DIAGNOSIS — R2681 Unsteadiness on feet: Secondary | ICD-10-CM | POA: Diagnosis not present

## 2021-06-02 DIAGNOSIS — L89120 Pressure ulcer of left upper back, unstageable: Secondary | ICD-10-CM | POA: Diagnosis not present

## 2021-06-02 DIAGNOSIS — I739 Peripheral vascular disease, unspecified: Secondary | ICD-10-CM | POA: Diagnosis not present

## 2021-06-04 DIAGNOSIS — I739 Peripheral vascular disease, unspecified: Secondary | ICD-10-CM | POA: Diagnosis not present

## 2021-06-04 DIAGNOSIS — M6259 Muscle wasting and atrophy, not elsewhere classified, multiple sites: Secondary | ICD-10-CM | POA: Diagnosis not present

## 2021-06-04 DIAGNOSIS — L89120 Pressure ulcer of left upper back, unstageable: Secondary | ICD-10-CM | POA: Diagnosis not present

## 2021-06-04 DIAGNOSIS — M6281 Muscle weakness (generalized): Secondary | ICD-10-CM | POA: Diagnosis not present

## 2021-06-04 DIAGNOSIS — Z8616 Personal history of COVID-19: Secondary | ICD-10-CM | POA: Diagnosis not present

## 2021-06-04 DIAGNOSIS — R262 Difficulty in walking, not elsewhere classified: Secondary | ICD-10-CM | POA: Diagnosis not present

## 2021-06-04 DIAGNOSIS — R2681 Unsteadiness on feet: Secondary | ICD-10-CM | POA: Diagnosis not present

## 2021-06-04 DIAGNOSIS — R41841 Cognitive communication deficit: Secondary | ICD-10-CM | POA: Diagnosis not present

## 2021-06-04 DIAGNOSIS — A419 Sepsis, unspecified organism: Secondary | ICD-10-CM | POA: Diagnosis not present

## 2021-06-05 DIAGNOSIS — I739 Peripheral vascular disease, unspecified: Secondary | ICD-10-CM | POA: Diagnosis not present

## 2021-06-05 DIAGNOSIS — A419 Sepsis, unspecified organism: Secondary | ICD-10-CM | POA: Diagnosis not present

## 2021-06-05 DIAGNOSIS — M6259 Muscle wasting and atrophy, not elsewhere classified, multiple sites: Secondary | ICD-10-CM | POA: Diagnosis not present

## 2021-06-05 DIAGNOSIS — L89120 Pressure ulcer of left upper back, unstageable: Secondary | ICD-10-CM | POA: Diagnosis not present

## 2021-06-05 DIAGNOSIS — M6281 Muscle weakness (generalized): Secondary | ICD-10-CM | POA: Diagnosis not present

## 2021-06-05 DIAGNOSIS — R41841 Cognitive communication deficit: Secondary | ICD-10-CM | POA: Diagnosis not present

## 2021-06-05 DIAGNOSIS — R2681 Unsteadiness on feet: Secondary | ICD-10-CM | POA: Diagnosis not present

## 2021-06-05 DIAGNOSIS — Z8616 Personal history of COVID-19: Secondary | ICD-10-CM | POA: Diagnosis not present

## 2021-06-05 DIAGNOSIS — R262 Difficulty in walking, not elsewhere classified: Secondary | ICD-10-CM | POA: Diagnosis not present

## 2021-06-06 DIAGNOSIS — A419 Sepsis, unspecified organism: Secondary | ICD-10-CM | POA: Diagnosis not present

## 2021-06-06 DIAGNOSIS — I739 Peripheral vascular disease, unspecified: Secondary | ICD-10-CM | POA: Diagnosis not present

## 2021-06-06 DIAGNOSIS — R41841 Cognitive communication deficit: Secondary | ICD-10-CM | POA: Diagnosis not present

## 2021-06-06 DIAGNOSIS — R2681 Unsteadiness on feet: Secondary | ICD-10-CM | POA: Diagnosis not present

## 2021-06-06 DIAGNOSIS — L89120 Pressure ulcer of left upper back, unstageable: Secondary | ICD-10-CM | POA: Diagnosis not present

## 2021-06-06 DIAGNOSIS — Z8616 Personal history of COVID-19: Secondary | ICD-10-CM | POA: Diagnosis not present

## 2021-06-06 DIAGNOSIS — M6281 Muscle weakness (generalized): Secondary | ICD-10-CM | POA: Diagnosis not present

## 2021-06-06 DIAGNOSIS — R262 Difficulty in walking, not elsewhere classified: Secondary | ICD-10-CM | POA: Diagnosis not present

## 2021-06-06 DIAGNOSIS — M6259 Muscle wasting and atrophy, not elsewhere classified, multiple sites: Secondary | ICD-10-CM | POA: Diagnosis not present

## 2021-06-07 DIAGNOSIS — A419 Sepsis, unspecified organism: Secondary | ICD-10-CM | POA: Diagnosis not present

## 2021-06-07 DIAGNOSIS — Z8616 Personal history of COVID-19: Secondary | ICD-10-CM | POA: Diagnosis not present

## 2021-06-07 DIAGNOSIS — L89120 Pressure ulcer of left upper back, unstageable: Secondary | ICD-10-CM | POA: Diagnosis not present

## 2021-06-07 DIAGNOSIS — I739 Peripheral vascular disease, unspecified: Secondary | ICD-10-CM | POA: Diagnosis not present

## 2021-06-07 DIAGNOSIS — R262 Difficulty in walking, not elsewhere classified: Secondary | ICD-10-CM | POA: Diagnosis not present

## 2021-06-07 DIAGNOSIS — R2681 Unsteadiness on feet: Secondary | ICD-10-CM | POA: Diagnosis not present

## 2021-06-07 DIAGNOSIS — M6281 Muscle weakness (generalized): Secondary | ICD-10-CM | POA: Diagnosis not present

## 2021-06-07 DIAGNOSIS — M6259 Muscle wasting and atrophy, not elsewhere classified, multiple sites: Secondary | ICD-10-CM | POA: Diagnosis not present

## 2021-06-07 DIAGNOSIS — R41841 Cognitive communication deficit: Secondary | ICD-10-CM | POA: Diagnosis not present

## 2021-06-08 DIAGNOSIS — R262 Difficulty in walking, not elsewhere classified: Secondary | ICD-10-CM | POA: Diagnosis not present

## 2021-06-08 DIAGNOSIS — M6281 Muscle weakness (generalized): Secondary | ICD-10-CM | POA: Diagnosis not present

## 2021-06-08 DIAGNOSIS — L89120 Pressure ulcer of left upper back, unstageable: Secondary | ICD-10-CM | POA: Diagnosis not present

## 2021-06-08 DIAGNOSIS — R41841 Cognitive communication deficit: Secondary | ICD-10-CM | POA: Diagnosis not present

## 2021-06-08 DIAGNOSIS — M6259 Muscle wasting and atrophy, not elsewhere classified, multiple sites: Secondary | ICD-10-CM | POA: Diagnosis not present

## 2021-06-08 DIAGNOSIS — L89150 Pressure ulcer of sacral region, unstageable: Secondary | ICD-10-CM | POA: Diagnosis not present

## 2021-06-08 DIAGNOSIS — I739 Peripheral vascular disease, unspecified: Secondary | ICD-10-CM | POA: Diagnosis not present

## 2021-06-08 DIAGNOSIS — Z8616 Personal history of COVID-19: Secondary | ICD-10-CM | POA: Diagnosis not present

## 2021-06-08 DIAGNOSIS — R2681 Unsteadiness on feet: Secondary | ICD-10-CM | POA: Diagnosis not present

## 2021-06-12 DIAGNOSIS — M6259 Muscle wasting and atrophy, not elsewhere classified, multiple sites: Secondary | ICD-10-CM | POA: Diagnosis not present

## 2021-06-12 DIAGNOSIS — M6281 Muscle weakness (generalized): Secondary | ICD-10-CM | POA: Diagnosis not present

## 2021-06-12 DIAGNOSIS — L89150 Pressure ulcer of sacral region, unstageable: Secondary | ICD-10-CM | POA: Diagnosis not present

## 2021-06-12 DIAGNOSIS — R2681 Unsteadiness on feet: Secondary | ICD-10-CM | POA: Diagnosis not present

## 2021-06-12 DIAGNOSIS — I739 Peripheral vascular disease, unspecified: Secondary | ICD-10-CM | POA: Diagnosis not present

## 2021-06-12 DIAGNOSIS — L89120 Pressure ulcer of left upper back, unstageable: Secondary | ICD-10-CM | POA: Diagnosis not present

## 2021-06-12 DIAGNOSIS — R41841 Cognitive communication deficit: Secondary | ICD-10-CM | POA: Diagnosis not present

## 2021-06-12 DIAGNOSIS — R262 Difficulty in walking, not elsewhere classified: Secondary | ICD-10-CM | POA: Diagnosis not present

## 2021-06-12 DIAGNOSIS — Z8616 Personal history of COVID-19: Secondary | ICD-10-CM | POA: Diagnosis not present

## 2021-06-13 DIAGNOSIS — F1194 Opioid use, unspecified with opioid-induced mood disorder: Secondary | ICD-10-CM | POA: Diagnosis not present

## 2021-06-13 DIAGNOSIS — M6259 Muscle wasting and atrophy, not elsewhere classified, multiple sites: Secondary | ICD-10-CM | POA: Diagnosis not present

## 2021-06-13 DIAGNOSIS — F064 Anxiety disorder due to known physiological condition: Secondary | ICD-10-CM | POA: Diagnosis not present

## 2021-06-13 DIAGNOSIS — Z8616 Personal history of COVID-19: Secondary | ICD-10-CM | POA: Diagnosis not present

## 2021-06-13 DIAGNOSIS — R41841 Cognitive communication deficit: Secondary | ICD-10-CM | POA: Diagnosis not present

## 2021-06-13 DIAGNOSIS — L89150 Pressure ulcer of sacral region, unstageable: Secondary | ICD-10-CM | POA: Diagnosis not present

## 2021-06-13 DIAGNOSIS — R2681 Unsteadiness on feet: Secondary | ICD-10-CM | POA: Diagnosis not present

## 2021-06-13 DIAGNOSIS — M6281 Muscle weakness (generalized): Secondary | ICD-10-CM | POA: Diagnosis not present

## 2021-06-13 DIAGNOSIS — F331 Major depressive disorder, recurrent, moderate: Secondary | ICD-10-CM | POA: Diagnosis not present

## 2021-06-13 DIAGNOSIS — I739 Peripheral vascular disease, unspecified: Secondary | ICD-10-CM | POA: Diagnosis not present

## 2021-06-13 DIAGNOSIS — L89120 Pressure ulcer of left upper back, unstageable: Secondary | ICD-10-CM | POA: Diagnosis not present

## 2021-06-13 DIAGNOSIS — R262 Difficulty in walking, not elsewhere classified: Secondary | ICD-10-CM | POA: Diagnosis not present

## 2021-06-14 DIAGNOSIS — I739 Peripheral vascular disease, unspecified: Secondary | ICD-10-CM | POA: Diagnosis not present

## 2021-06-14 DIAGNOSIS — F1194 Opioid use, unspecified with opioid-induced mood disorder: Secondary | ICD-10-CM | POA: Diagnosis not present

## 2021-06-14 DIAGNOSIS — M6259 Muscle wasting and atrophy, not elsewhere classified, multiple sites: Secondary | ICD-10-CM | POA: Diagnosis not present

## 2021-06-14 DIAGNOSIS — L89150 Pressure ulcer of sacral region, unstageable: Secondary | ICD-10-CM | POA: Diagnosis not present

## 2021-06-14 DIAGNOSIS — F064 Anxiety disorder due to known physiological condition: Secondary | ICD-10-CM | POA: Diagnosis not present

## 2021-06-14 DIAGNOSIS — M6281 Muscle weakness (generalized): Secondary | ICD-10-CM | POA: Diagnosis not present

## 2021-06-14 DIAGNOSIS — R2681 Unsteadiness on feet: Secondary | ICD-10-CM | POA: Diagnosis not present

## 2021-06-14 DIAGNOSIS — Z8616 Personal history of COVID-19: Secondary | ICD-10-CM | POA: Diagnosis not present

## 2021-06-14 DIAGNOSIS — R41841 Cognitive communication deficit: Secondary | ICD-10-CM | POA: Diagnosis not present

## 2021-06-14 DIAGNOSIS — R262 Difficulty in walking, not elsewhere classified: Secondary | ICD-10-CM | POA: Diagnosis not present

## 2021-06-14 DIAGNOSIS — L89120 Pressure ulcer of left upper back, unstageable: Secondary | ICD-10-CM | POA: Diagnosis not present

## 2021-06-14 DIAGNOSIS — F331 Major depressive disorder, recurrent, moderate: Secondary | ICD-10-CM | POA: Diagnosis not present

## 2021-06-15 DIAGNOSIS — M6281 Muscle weakness (generalized): Secondary | ICD-10-CM | POA: Diagnosis not present

## 2021-06-15 DIAGNOSIS — M6259 Muscle wasting and atrophy, not elsewhere classified, multiple sites: Secondary | ICD-10-CM | POA: Diagnosis not present

## 2021-06-15 DIAGNOSIS — R41841 Cognitive communication deficit: Secondary | ICD-10-CM | POA: Diagnosis not present

## 2021-06-15 DIAGNOSIS — L89120 Pressure ulcer of left upper back, unstageable: Secondary | ICD-10-CM | POA: Diagnosis not present

## 2021-06-15 DIAGNOSIS — L89150 Pressure ulcer of sacral region, unstageable: Secondary | ICD-10-CM | POA: Diagnosis not present

## 2021-06-15 DIAGNOSIS — Z8616 Personal history of COVID-19: Secondary | ICD-10-CM | POA: Diagnosis not present

## 2021-06-15 DIAGNOSIS — I739 Peripheral vascular disease, unspecified: Secondary | ICD-10-CM | POA: Diagnosis not present

## 2021-06-15 DIAGNOSIS — R2681 Unsteadiness on feet: Secondary | ICD-10-CM | POA: Diagnosis not present

## 2021-06-15 DIAGNOSIS — R262 Difficulty in walking, not elsewhere classified: Secondary | ICD-10-CM | POA: Diagnosis not present

## 2021-06-16 DIAGNOSIS — R41841 Cognitive communication deficit: Secondary | ICD-10-CM | POA: Diagnosis not present

## 2021-06-16 DIAGNOSIS — R2681 Unsteadiness on feet: Secondary | ICD-10-CM | POA: Diagnosis not present

## 2021-06-16 DIAGNOSIS — R262 Difficulty in walking, not elsewhere classified: Secondary | ICD-10-CM | POA: Diagnosis not present

## 2021-06-16 DIAGNOSIS — I739 Peripheral vascular disease, unspecified: Secondary | ICD-10-CM | POA: Diagnosis not present

## 2021-06-16 DIAGNOSIS — L89120 Pressure ulcer of left upper back, unstageable: Secondary | ICD-10-CM | POA: Diagnosis not present

## 2021-06-16 DIAGNOSIS — L89150 Pressure ulcer of sacral region, unstageable: Secondary | ICD-10-CM | POA: Diagnosis not present

## 2021-06-16 DIAGNOSIS — Z8616 Personal history of COVID-19: Secondary | ICD-10-CM | POA: Diagnosis not present

## 2021-06-16 DIAGNOSIS — M6259 Muscle wasting and atrophy, not elsewhere classified, multiple sites: Secondary | ICD-10-CM | POA: Diagnosis not present

## 2021-06-16 DIAGNOSIS — M6281 Muscle weakness (generalized): Secondary | ICD-10-CM | POA: Diagnosis not present

## 2021-06-19 DIAGNOSIS — Z8616 Personal history of COVID-19: Secondary | ICD-10-CM | POA: Diagnosis not present

## 2021-06-19 DIAGNOSIS — L89150 Pressure ulcer of sacral region, unstageable: Secondary | ICD-10-CM | POA: Diagnosis not present

## 2021-06-19 DIAGNOSIS — R2681 Unsteadiness on feet: Secondary | ICD-10-CM | POA: Diagnosis not present

## 2021-06-19 DIAGNOSIS — L89120 Pressure ulcer of left upper back, unstageable: Secondary | ICD-10-CM | POA: Diagnosis not present

## 2021-06-19 DIAGNOSIS — M6281 Muscle weakness (generalized): Secondary | ICD-10-CM | POA: Diagnosis not present

## 2021-06-19 DIAGNOSIS — M6259 Muscle wasting and atrophy, not elsewhere classified, multiple sites: Secondary | ICD-10-CM | POA: Diagnosis not present

## 2021-06-19 DIAGNOSIS — I739 Peripheral vascular disease, unspecified: Secondary | ICD-10-CM | POA: Diagnosis not present

## 2021-06-19 DIAGNOSIS — R41841 Cognitive communication deficit: Secondary | ICD-10-CM | POA: Diagnosis not present

## 2021-06-19 DIAGNOSIS — R262 Difficulty in walking, not elsewhere classified: Secondary | ICD-10-CM | POA: Diagnosis not present

## 2021-06-20 DIAGNOSIS — L89150 Pressure ulcer of sacral region, unstageable: Secondary | ICD-10-CM | POA: Diagnosis not present

## 2021-06-20 DIAGNOSIS — R262 Difficulty in walking, not elsewhere classified: Secondary | ICD-10-CM | POA: Diagnosis not present

## 2021-06-20 DIAGNOSIS — R41841 Cognitive communication deficit: Secondary | ICD-10-CM | POA: Diagnosis not present

## 2021-06-20 DIAGNOSIS — M6281 Muscle weakness (generalized): Secondary | ICD-10-CM | POA: Diagnosis not present

## 2021-06-20 DIAGNOSIS — L89120 Pressure ulcer of left upper back, unstageable: Secondary | ICD-10-CM | POA: Diagnosis not present

## 2021-06-20 DIAGNOSIS — Z8616 Personal history of COVID-19: Secondary | ICD-10-CM | POA: Diagnosis not present

## 2021-06-20 DIAGNOSIS — R2681 Unsteadiness on feet: Secondary | ICD-10-CM | POA: Diagnosis not present

## 2021-06-20 DIAGNOSIS — I739 Peripheral vascular disease, unspecified: Secondary | ICD-10-CM | POA: Diagnosis not present

## 2021-06-20 DIAGNOSIS — M6259 Muscle wasting and atrophy, not elsewhere classified, multiple sites: Secondary | ICD-10-CM | POA: Diagnosis not present

## 2021-06-21 DIAGNOSIS — R41841 Cognitive communication deficit: Secondary | ICD-10-CM | POA: Diagnosis not present

## 2021-06-21 DIAGNOSIS — Z8616 Personal history of COVID-19: Secondary | ICD-10-CM | POA: Diagnosis not present

## 2021-06-21 DIAGNOSIS — L89120 Pressure ulcer of left upper back, unstageable: Secondary | ICD-10-CM | POA: Diagnosis not present

## 2021-06-21 DIAGNOSIS — M6281 Muscle weakness (generalized): Secondary | ICD-10-CM | POA: Diagnosis not present

## 2021-06-21 DIAGNOSIS — I739 Peripheral vascular disease, unspecified: Secondary | ICD-10-CM | POA: Diagnosis not present

## 2021-06-21 DIAGNOSIS — R262 Difficulty in walking, not elsewhere classified: Secondary | ICD-10-CM | POA: Diagnosis not present

## 2021-06-21 DIAGNOSIS — R2681 Unsteadiness on feet: Secondary | ICD-10-CM | POA: Diagnosis not present

## 2021-06-21 DIAGNOSIS — M6259 Muscle wasting and atrophy, not elsewhere classified, multiple sites: Secondary | ICD-10-CM | POA: Diagnosis not present

## 2021-06-21 DIAGNOSIS — L89150 Pressure ulcer of sacral region, unstageable: Secondary | ICD-10-CM | POA: Diagnosis not present

## 2021-06-27 DIAGNOSIS — F1194 Opioid use, unspecified with opioid-induced mood disorder: Secondary | ICD-10-CM | POA: Diagnosis not present

## 2021-06-27 DIAGNOSIS — F331 Major depressive disorder, recurrent, moderate: Secondary | ICD-10-CM | POA: Diagnosis not present

## 2021-06-27 DIAGNOSIS — F064 Anxiety disorder due to known physiological condition: Secondary | ICD-10-CM | POA: Diagnosis not present

## 2021-07-03 DIAGNOSIS — R2681 Unsteadiness on feet: Secondary | ICD-10-CM | POA: Diagnosis not present

## 2021-07-03 DIAGNOSIS — M6281 Muscle weakness (generalized): Secondary | ICD-10-CM | POA: Diagnosis not present

## 2021-07-05 ENCOUNTER — Other Ambulatory Visit: Payer: Self-pay

## 2021-07-05 ENCOUNTER — Emergency Department: Payer: Medicare HMO

## 2021-07-05 DIAGNOSIS — Z66 Do not resuscitate: Secondary | ICD-10-CM | POA: Diagnosis not present

## 2021-07-05 DIAGNOSIS — R531 Weakness: Secondary | ICD-10-CM | POA: Diagnosis not present

## 2021-07-05 DIAGNOSIS — Z7901 Long term (current) use of anticoagulants: Secondary | ICD-10-CM

## 2021-07-05 DIAGNOSIS — J9601 Acute respiratory failure with hypoxia: Secondary | ICD-10-CM | POA: Diagnosis present

## 2021-07-05 DIAGNOSIS — S0990XA Unspecified injury of head, initial encounter: Secondary | ICD-10-CM | POA: Diagnosis not present

## 2021-07-05 DIAGNOSIS — J9811 Atelectasis: Secondary | ICD-10-CM | POA: Diagnosis not present

## 2021-07-05 DIAGNOSIS — E860 Dehydration: Secondary | ICD-10-CM | POA: Diagnosis present

## 2021-07-05 DIAGNOSIS — N179 Acute kidney failure, unspecified: Secondary | ICD-10-CM | POA: Diagnosis not present

## 2021-07-05 DIAGNOSIS — R4182 Altered mental status, unspecified: Secondary | ICD-10-CM | POA: Diagnosis not present

## 2021-07-05 DIAGNOSIS — F32A Depression, unspecified: Secondary | ICD-10-CM | POA: Diagnosis not present

## 2021-07-05 DIAGNOSIS — Z86718 Personal history of other venous thrombosis and embolism: Secondary | ICD-10-CM

## 2021-07-05 DIAGNOSIS — R410 Disorientation, unspecified: Secondary | ICD-10-CM | POA: Diagnosis not present

## 2021-07-05 DIAGNOSIS — I10 Essential (primary) hypertension: Secondary | ICD-10-CM | POA: Diagnosis not present

## 2021-07-05 DIAGNOSIS — F1110 Opioid abuse, uncomplicated: Secondary | ICD-10-CM | POA: Diagnosis present

## 2021-07-05 DIAGNOSIS — E559 Vitamin D deficiency, unspecified: Secondary | ICD-10-CM | POA: Diagnosis present

## 2021-07-05 DIAGNOSIS — B182 Chronic viral hepatitis C: Secondary | ICD-10-CM | POA: Diagnosis present

## 2021-07-05 DIAGNOSIS — J189 Pneumonia, unspecified organism: Secondary | ICD-10-CM | POA: Diagnosis not present

## 2021-07-05 DIAGNOSIS — F064 Anxiety disorder due to known physiological condition: Secondary | ICD-10-CM | POA: Diagnosis not present

## 2021-07-05 DIAGNOSIS — R32 Unspecified urinary incontinence: Secondary | ICD-10-CM | POA: Diagnosis present

## 2021-07-05 DIAGNOSIS — G894 Chronic pain syndrome: Secondary | ICD-10-CM | POA: Diagnosis present

## 2021-07-05 DIAGNOSIS — E785 Hyperlipidemia, unspecified: Secondary | ICD-10-CM | POA: Diagnosis present

## 2021-07-05 DIAGNOSIS — Z87891 Personal history of nicotine dependence: Secondary | ICD-10-CM

## 2021-07-05 DIAGNOSIS — E1122 Type 2 diabetes mellitus with diabetic chronic kidney disease: Secondary | ICD-10-CM | POA: Diagnosis present

## 2021-07-05 DIAGNOSIS — F410 Panic disorder [episodic paroxysmal anxiety] without agoraphobia: Secondary | ICD-10-CM | POA: Diagnosis present

## 2021-07-05 DIAGNOSIS — I959 Hypotension, unspecified: Secondary | ICD-10-CM | POA: Diagnosis not present

## 2021-07-05 DIAGNOSIS — N189 Chronic kidney disease, unspecified: Secondary | ICD-10-CM | POA: Diagnosis not present

## 2021-07-05 DIAGNOSIS — R404 Transient alteration of awareness: Secondary | ICD-10-CM | POA: Diagnosis not present

## 2021-07-05 DIAGNOSIS — Z90710 Acquired absence of both cervix and uterus: Secondary | ICD-10-CM

## 2021-07-05 DIAGNOSIS — Z79899 Other long term (current) drug therapy: Secondary | ICD-10-CM

## 2021-07-05 DIAGNOSIS — N184 Chronic kidney disease, stage 4 (severe): Secondary | ICD-10-CM | POA: Diagnosis present

## 2021-07-05 DIAGNOSIS — Z833 Family history of diabetes mellitus: Secondary | ICD-10-CM | POA: Diagnosis not present

## 2021-07-05 DIAGNOSIS — R9082 White matter disease, unspecified: Secondary | ICD-10-CM | POA: Diagnosis not present

## 2021-07-05 DIAGNOSIS — R0902 Hypoxemia: Secondary | ICD-10-CM | POA: Diagnosis not present

## 2021-07-05 DIAGNOSIS — E1151 Type 2 diabetes mellitus with diabetic peripheral angiopathy without gangrene: Secondary | ICD-10-CM | POA: Diagnosis present

## 2021-07-05 DIAGNOSIS — G319 Degenerative disease of nervous system, unspecified: Secondary | ICD-10-CM | POA: Diagnosis not present

## 2021-07-05 DIAGNOSIS — G9349 Other encephalopathy: Secondary | ICD-10-CM | POA: Diagnosis not present

## 2021-07-05 DIAGNOSIS — K7460 Unspecified cirrhosis of liver: Secondary | ICD-10-CM | POA: Diagnosis present

## 2021-07-05 DIAGNOSIS — R2681 Unsteadiness on feet: Secondary | ICD-10-CM | POA: Diagnosis not present

## 2021-07-05 DIAGNOSIS — Z809 Family history of malignant neoplasm, unspecified: Secondary | ICD-10-CM

## 2021-07-05 DIAGNOSIS — I129 Hypertensive chronic kidney disease with stage 1 through stage 4 chronic kidney disease, or unspecified chronic kidney disease: Secondary | ICD-10-CM | POA: Diagnosis present

## 2021-07-05 DIAGNOSIS — Z8249 Family history of ischemic heart disease and other diseases of the circulatory system: Secondary | ICD-10-CM

## 2021-07-05 DIAGNOSIS — I517 Cardiomegaly: Secondary | ICD-10-CM | POA: Diagnosis not present

## 2021-07-05 DIAGNOSIS — F1194 Opioid use, unspecified with opioid-induced mood disorder: Secondary | ICD-10-CM | POA: Diagnosis not present

## 2021-07-05 DIAGNOSIS — Z20822 Contact with and (suspected) exposure to covid-19: Secondary | ICD-10-CM | POA: Diagnosis present

## 2021-07-05 DIAGNOSIS — R9431 Abnormal electrocardiogram [ECG] [EKG]: Secondary | ICD-10-CM | POA: Diagnosis not present

## 2021-07-05 DIAGNOSIS — Z7401 Bed confinement status: Secondary | ICD-10-CM | POA: Diagnosis not present

## 2021-07-05 DIAGNOSIS — M6281 Muscle weakness (generalized): Secondary | ICD-10-CM | POA: Diagnosis not present

## 2021-07-05 DIAGNOSIS — I491 Atrial premature depolarization: Secondary | ICD-10-CM | POA: Diagnosis not present

## 2021-07-05 LAB — CBC
HCT: 35.3 % — ABNORMAL LOW (ref 36.0–46.0)
Hemoglobin: 11 g/dL — ABNORMAL LOW (ref 12.0–15.0)
MCH: 28 pg (ref 26.0–34.0)
MCHC: 31.2 g/dL (ref 30.0–36.0)
MCV: 89.8 fL (ref 80.0–100.0)
Platelets: 375 10*3/uL (ref 150–400)
RBC: 3.93 MIL/uL (ref 3.87–5.11)
RDW: 12.4 % (ref 11.5–15.5)
WBC: 13.9 10*3/uL — ABNORMAL HIGH (ref 4.0–10.5)
nRBC: 0 % (ref 0.0–0.2)

## 2021-07-05 LAB — COMPREHENSIVE METABOLIC PANEL
ALT: 8 U/L (ref 0–44)
AST: 14 U/L — ABNORMAL LOW (ref 15–41)
Albumin: 3.3 g/dL — ABNORMAL LOW (ref 3.5–5.0)
Alkaline Phosphatase: 58 U/L (ref 38–126)
Anion gap: 9 (ref 5–15)
BUN: 35 mg/dL — ABNORMAL HIGH (ref 8–23)
CO2: 26 mmol/L (ref 22–32)
Calcium: 9 mg/dL (ref 8.9–10.3)
Chloride: 102 mmol/L (ref 98–111)
Creatinine, Ser: 1.83 mg/dL — ABNORMAL HIGH (ref 0.44–1.00)
GFR, Estimated: 29 mL/min — ABNORMAL LOW (ref >60)
Glucose, Bld: 96 mg/dL (ref 70–99)
Potassium: 4.5 mmol/L (ref 3.5–5.1)
Sodium: 137 mmol/L (ref 135–145)
Total Bilirubin: 0.7 mg/dL (ref 0.3–1.2)
Total Protein: 6.4 g/dL — ABNORMAL LOW (ref 6.5–8.1)

## 2021-07-05 LAB — RESP PANEL BY RT-PCR (FLU A&B, COVID) ARPGX2
Influenza A by PCR: NEGATIVE
Influenza B by PCR: NEGATIVE
SARS Coronavirus 2 by RT PCR: NEGATIVE

## 2021-07-05 NOTE — ED Triage Notes (Signed)
Pt here via [facility] from [facility] with a fall. Pt not responding to this writer but vitals WNL. Pt oxygen sat was at 88% on RA, pt placed on 3L in triage with sats increasing to 96%.

## 2021-07-06 ENCOUNTER — Encounter: Payer: Self-pay | Admitting: Family Medicine

## 2021-07-06 ENCOUNTER — Inpatient Hospital Stay
Admission: EM | Admit: 2021-07-06 | Discharge: 2021-07-07 | DRG: 193 | Disposition: A | Discharge status: Skilled Nursing Facility | Payer: Medicare HMO | Source: Skilled Nursing Facility | Attending: Family Medicine | Admitting: Family Medicine

## 2021-07-06 ENCOUNTER — Emergency Department: Payer: Medicare HMO

## 2021-07-06 ENCOUNTER — Other Ambulatory Visit: Payer: Self-pay

## 2021-07-06 DIAGNOSIS — I129 Hypertensive chronic kidney disease with stage 1 through stage 4 chronic kidney disease, or unspecified chronic kidney disease: Secondary | ICD-10-CM | POA: Diagnosis present

## 2021-07-06 DIAGNOSIS — J9601 Acute respiratory failure with hypoxia: Secondary | ICD-10-CM | POA: Diagnosis not present

## 2021-07-06 DIAGNOSIS — N179 Acute kidney failure, unspecified: Secondary | ICD-10-CM

## 2021-07-06 DIAGNOSIS — E1122 Type 2 diabetes mellitus with diabetic chronic kidney disease: Secondary | ICD-10-CM | POA: Diagnosis present

## 2021-07-06 DIAGNOSIS — F1110 Opioid abuse, uncomplicated: Secondary | ICD-10-CM | POA: Diagnosis present

## 2021-07-06 DIAGNOSIS — E860 Dehydration: Secondary | ICD-10-CM | POA: Diagnosis present

## 2021-07-06 DIAGNOSIS — Z809 Family history of malignant neoplasm, unspecified: Secondary | ICD-10-CM | POA: Diagnosis not present

## 2021-07-06 DIAGNOSIS — Z7401 Bed confinement status: Secondary | ICD-10-CM | POA: Diagnosis not present

## 2021-07-06 DIAGNOSIS — F410 Panic disorder [episodic paroxysmal anxiety] without agoraphobia: Secondary | ICD-10-CM | POA: Diagnosis present

## 2021-07-06 DIAGNOSIS — I10 Essential (primary) hypertension: Secondary | ICD-10-CM | POA: Diagnosis not present

## 2021-07-06 DIAGNOSIS — Z7901 Long term (current) use of anticoagulants: Secondary | ICD-10-CM | POA: Diagnosis not present

## 2021-07-06 DIAGNOSIS — N189 Chronic kidney disease, unspecified: Secondary | ICD-10-CM | POA: Diagnosis not present

## 2021-07-06 DIAGNOSIS — J189 Pneumonia, unspecified organism: Principal | ICD-10-CM

## 2021-07-06 DIAGNOSIS — B182 Chronic viral hepatitis C: Secondary | ICD-10-CM | POA: Diagnosis present

## 2021-07-06 DIAGNOSIS — Z79899 Other long term (current) drug therapy: Secondary | ICD-10-CM | POA: Diagnosis not present

## 2021-07-06 DIAGNOSIS — G9349 Other encephalopathy: Secondary | ICD-10-CM | POA: Diagnosis present

## 2021-07-06 DIAGNOSIS — R410 Disorientation, unspecified: Secondary | ICD-10-CM | POA: Diagnosis present

## 2021-07-06 DIAGNOSIS — E1151 Type 2 diabetes mellitus with diabetic peripheral angiopathy without gangrene: Secondary | ICD-10-CM | POA: Diagnosis present

## 2021-07-06 DIAGNOSIS — Z90710 Acquired absence of both cervix and uterus: Secondary | ICD-10-CM | POA: Diagnosis not present

## 2021-07-06 DIAGNOSIS — R0902 Hypoxemia: Secondary | ICD-10-CM | POA: Diagnosis not present

## 2021-07-06 DIAGNOSIS — E785 Hyperlipidemia, unspecified: Secondary | ICD-10-CM | POA: Diagnosis not present

## 2021-07-06 DIAGNOSIS — Z833 Family history of diabetes mellitus: Secondary | ICD-10-CM | POA: Diagnosis not present

## 2021-07-06 DIAGNOSIS — Z20822 Contact with and (suspected) exposure to covid-19: Secondary | ICD-10-CM | POA: Diagnosis present

## 2021-07-06 DIAGNOSIS — N184 Chronic kidney disease, stage 4 (severe): Secondary | ICD-10-CM | POA: Diagnosis present

## 2021-07-06 DIAGNOSIS — F32A Depression, unspecified: Secondary | ICD-10-CM | POA: Diagnosis present

## 2021-07-06 DIAGNOSIS — E559 Vitamin D deficiency, unspecified: Secondary | ICD-10-CM | POA: Diagnosis present

## 2021-07-06 DIAGNOSIS — G894 Chronic pain syndrome: Secondary | ICD-10-CM | POA: Diagnosis present

## 2021-07-06 DIAGNOSIS — Z8249 Family history of ischemic heart disease and other diseases of the circulatory system: Secondary | ICD-10-CM | POA: Diagnosis not present

## 2021-07-06 DIAGNOSIS — R404 Transient alteration of awareness: Secondary | ICD-10-CM | POA: Diagnosis not present

## 2021-07-06 DIAGNOSIS — Z66 Do not resuscitate: Secondary | ICD-10-CM | POA: Diagnosis present

## 2021-07-06 LAB — CBC
HCT: 32.1 % — ABNORMAL LOW (ref 36.0–46.0)
Hemoglobin: 10.1 g/dL — ABNORMAL LOW (ref 12.0–15.0)
MCH: 28.9 pg (ref 26.0–34.0)
MCHC: 31.5 g/dL (ref 30.0–36.0)
MCV: 91.7 fL (ref 80.0–100.0)
Platelets: 314 10*3/uL (ref 150–400)
RBC: 3.5 MIL/uL — ABNORMAL LOW (ref 3.87–5.11)
RDW: 12.5 % (ref 11.5–15.5)
WBC: 6.7 10*3/uL (ref 4.0–10.5)
nRBC: 0 % (ref 0.0–0.2)

## 2021-07-06 LAB — BASIC METABOLIC PANEL
Anion gap: 8 (ref 5–15)
BUN: 32 mg/dL — ABNORMAL HIGH (ref 8–23)
CO2: 26 mmol/L (ref 22–32)
Calcium: 8.7 mg/dL — ABNORMAL LOW (ref 8.9–10.3)
Chloride: 104 mmol/L (ref 98–111)
Creatinine, Ser: 1.62 mg/dL — ABNORMAL HIGH (ref 0.44–1.00)
GFR, Estimated: 34 mL/min — ABNORMAL LOW (ref >60)
Glucose, Bld: 92 mg/dL (ref 70–99)
Potassium: 4.3 mmol/L (ref 3.5–5.1)
Sodium: 138 mmol/L (ref 135–145)

## 2021-07-06 LAB — CBG MONITORING, ED
Glucose-Capillary: 69 mg/dL — ABNORMAL LOW (ref 70–99)
Glucose-Capillary: 131 mg/dL — ABNORMAL HIGH (ref 70–99)
Glucose-Capillary: 117 mg/dL — ABNORMAL HIGH (ref 70–99)

## 2021-07-06 LAB — TROPONIN I (HIGH SENSITIVITY)
Troponin I (High Sensitivity): 5 ng/L (ref <18)
Troponin I (High Sensitivity): 5 ng/L (ref <18)

## 2021-07-06 LAB — HEMOGLOBIN A1C
Hgb A1c MFr Bld: 5.9 % — ABNORMAL HIGH (ref 4.8–5.6)
Mean Plasma Glucose: 122.63 mg/dL

## 2021-07-06 LAB — GLUCOSE, CAPILLARY
Glucose-Capillary: 113 mg/dL — ABNORMAL HIGH (ref 70–99)
Glucose-Capillary: 111 mg/dL — ABNORMAL HIGH (ref 70–99)

## 2021-07-06 LAB — AMMONIA: Ammonia: 26 umol/L (ref 9–35)

## 2021-07-06 LAB — PROCALCITONIN: Procalcitonin: <0.1 ng/mL

## 2021-07-06 LAB — STREP PNEUMONIAE URINARY ANTIGEN: Strep Pneumo Urinary Antigen: NEGATIVE

## 2021-07-06 LAB — LACTIC ACID, PLASMA
Lactic Acid, Venous: 1.1 mmol/L (ref 0.5–1.9)
Lactic Acid, Venous: 0.7 mmol/L (ref 0.5–1.9)

## 2021-07-06 LAB — MRSA NEXT GEN BY PCR, NASAL: MRSA by PCR Next Gen: NOT DETECTED

## 2021-07-06 LAB — BRAIN NATRIURETIC PEPTIDE: B Natriuretic Peptide: 57.7 pg/mL (ref 0.0–100.0)

## 2021-07-06 MED ORDER — SODIUM CHLORIDE 0.9 % IV SOLN: 500.0000 mg | INTRAVENOUS | Status: DC

## 2021-07-06 MED ORDER — ONDANSETRON HCL 4 MG PO TABS: 4.0000 mg | ORAL_TABLET | Freq: Four times a day (QID) | ORAL | Status: DC | PRN

## 2021-07-06 MED ORDER — ACETAMINOPHEN 650 MG RE SUPP: 650.0000 mg | Freq: Four times a day (QID) | RECTAL | Status: DC | PRN

## 2021-07-06 MED ORDER — TRAZODONE HCL 50 MG PO TABS: 150.0000 mg | ORAL_TABLET | Freq: Every day | ORAL | Status: DC

## 2021-07-06 MED ORDER — ENOXAPARIN SODIUM 40 MG/0.4ML IJ SOSY: 40.0000 mg | PREFILLED_SYRINGE | INTRAMUSCULAR | Status: DC

## 2021-07-06 MED ORDER — GABAPENTIN 300 MG PO CAPS
300.0000 mg | ORAL_CAPSULE | Freq: Two times a day (BID) | ORAL | Status: DC
  Administered 2021-07-06: 12:00:00 300 mg via ORAL
  Filled 2021-07-06: qty 1

## 2021-07-06 MED ORDER — GLUCERNA SHAKE PO LIQD
237.0000 mL | Freq: Two times a day (BID) | ORAL | Status: DC
  Administered 2021-07-06 – 2021-07-07 (×2): 237 mL via ORAL

## 2021-07-06 MED ORDER — METHADONE HCL 10 MG/ML PO CONC
25.0000 mg | Freq: Every day | ORAL | Status: DC
  Administered 2021-07-06 – 2021-07-07 (×2): 25 mg via ORAL
  Filled 2021-07-06: qty 2.5
  Filled 2021-07-06: qty 5

## 2021-07-06 MED ORDER — LACTATED RINGERS IV SOLN: INTRAVENOUS | Status: DC

## 2021-07-06 MED ORDER — POTASSIUM CHLORIDE ER 10 MEQ PO TBCR
10.0000 meq | EXTENDED_RELEASE_TABLET | Freq: Every day | ORAL | Status: DC
Start: 2021-07-06 — End: 2021-07-06

## 2021-07-06 MED ORDER — ARIPIPRAZOLE 5 MG PO TABS
5.0000 mg | ORAL_TABLET | Freq: Every day | ORAL | Status: DC
  Administered 2021-07-06: 22:00:00 5 mg via ORAL
  Filled 2021-07-06 (×2): qty 1

## 2021-07-06 MED ORDER — CHLORHEXIDINE GLUCONATE CLOTH 2 % EX PADS
6.0000 | MEDICATED_PAD | Freq: Every day | CUTANEOUS | Status: DC
  Administered 2021-07-07: 11:00:00 6 via TOPICAL

## 2021-07-06 MED ORDER — SODIUM CHLORIDE 0.9 % IV SOLN
1.0000 g | Freq: Once | INTRAVENOUS | Status: AC
  Administered 2021-07-06: 03:00:00 1 g via INTRAVENOUS
  Filled 2021-07-06: qty 10

## 2021-07-06 MED ORDER — INSULIN ASPART 100 UNIT/ML IJ SOLN
0.0000 [IU] | Freq: Three times a day (TID) | INTRAMUSCULAR | Status: DC
  Administered 2021-07-06: 12:00:00 1 [IU] via SUBCUTANEOUS
  Filled 2021-07-06: qty 1

## 2021-07-06 MED ORDER — GUAIFENESIN ER 600 MG PO TB12
600.0000 mg | ORAL_TABLET | Freq: Two times a day (BID) | ORAL | Status: DC
  Administered 2021-07-06 – 2021-07-07 (×2): 600 mg via ORAL
  Filled 2021-07-06 (×4): qty 1

## 2021-07-06 MED ORDER — VITAMIN D 25 MCG (1000 UNIT) PO TABS
1000.0000 [IU] | ORAL_TABLET | Freq: Every day | ORAL | Status: DC
  Administered 2021-07-06 – 2021-07-07 (×2): 1000 [IU] via ORAL
  Filled 2021-07-06 (×2): qty 1

## 2021-07-06 MED ORDER — ALPRAZOLAM ER 1 MG PO TB24
2.0000 mg | ORAL_TABLET | Freq: Every day | ORAL | Status: DC
  Administered 2021-07-06 – 2021-07-07 (×2): 2 mg via ORAL
  Filled 2021-07-06 (×3): qty 2

## 2021-07-06 MED ORDER — METHADONE HCL 10 MG/ML PO CONC
5.0000 mg | Freq: Four times a day (QID) | ORAL | Status: DC | PRN
  Filled 2021-07-06: qty 5

## 2021-07-06 MED ORDER — ONDANSETRON HCL 4 MG/2ML IJ SOLN: 4.0000 mg | Freq: Four times a day (QID) | INTRAMUSCULAR | Status: DC | PRN

## 2021-07-06 MED ORDER — SODIUM CHLORIDE 0.9 % IV SOLN
500.0000 mg | Freq: Once | INTRAVENOUS | Status: AC
  Administered 2021-07-06: 04:00:00 500 mg via INTRAVENOUS
  Filled 2021-07-06: qty 5

## 2021-07-06 MED ORDER — TRAZODONE HCL 50 MG PO TABS: 50.0000 mg | ORAL_TABLET | Freq: Every day | ORAL | Status: DC

## 2021-07-06 MED ORDER — IPRATROPIUM-ALBUTEROL 0.5-2.5 (3) MG/3ML IN SOLN
3.0000 mL | Freq: Four times a day (QID) | RESPIRATORY_TRACT | Status: DC
  Administered 2021-07-06 – 2021-07-07 (×5): 3 mL via RESPIRATORY_TRACT
  Filled 2021-07-06 (×5): qty 3

## 2021-07-06 MED ORDER — SODIUM CHLORIDE 0.9 % IV SOLN: 2.0000 g | INTRAVENOUS | Status: DC

## 2021-07-06 MED ORDER — ESCITALOPRAM OXALATE 10 MG PO TABS: 5.0000 mg | ORAL_TABLET | Freq: Every day | ORAL | Status: DC

## 2021-07-06 MED ORDER — LEVOFLOXACIN 500 MG PO TABS
500.0000 mg | ORAL_TABLET | Freq: Every day | ORAL | Status: DC
  Administered 2021-07-07: 05:00:00 500 mg via ORAL
  Filled 2021-07-06: qty 1

## 2021-07-06 MED ORDER — METHADONE HCL 10 MG/ML PO CONC: 5.0000 mg | Freq: Three times a day (TID) | ORAL | Status: DC | PRN

## 2021-07-06 MED ORDER — HYDROCODONE-ACETAMINOPHEN 5-325 MG PO TABS: 1.0000 | ORAL_TABLET | Freq: Four times a day (QID) | ORAL | Status: DC | PRN

## 2021-07-06 MED ORDER — CLONAZEPAM 0.125 MG PO TBDP: 0.1250 mg | ORAL_TABLET | Freq: Two times a day (BID) | ORAL | Status: DC

## 2021-07-06 MED ORDER — INSULIN DETEMIR 100 UNIT/ML FLEXPEN: 10.0000 [IU] | PEN_INJECTOR | Freq: Every day | SUBCUTANEOUS | Status: DC

## 2021-07-06 MED ORDER — POTASSIUM CHLORIDE CRYS ER 10 MEQ PO TBCR
10.0000 meq | EXTENDED_RELEASE_TABLET | Freq: Every day | ORAL | Status: DC
  Administered 2021-07-06 – 2021-07-07 (×2): 10 meq via ORAL
  Filled 2021-07-06 (×2): qty 1

## 2021-07-06 MED ORDER — ATORVASTATIN CALCIUM 20 MG PO TABS
40.0000 mg | ORAL_TABLET | Freq: Every day | ORAL | Status: DC
  Administered 2021-07-06: 12:00:00 40 mg via ORAL
  Filled 2021-07-06: qty 2

## 2021-07-06 MED ORDER — CLOPIDOGREL BISULFATE 75 MG PO TABS
75.0000 mg | ORAL_TABLET | Freq: Every day | ORAL | Status: DC
  Administered 2021-07-06 – 2021-07-07 (×2): 75 mg via ORAL
  Filled 2021-07-06 (×2): qty 1

## 2021-07-06 MED ORDER — FERROUS SULFATE 325 (65 FE) MG PO TABS
325.0000 mg | ORAL_TABLET | Freq: Two times a day (BID) | ORAL | Status: DC
  Administered 2021-07-06 – 2021-07-07 (×3): 325 mg via ORAL
  Filled 2021-07-06 (×3): qty 1

## 2021-07-06 MED ORDER — ASCORBIC ACID 500 MG PO TABS: 250.0000 mg | ORAL_TABLET | Freq: Two times a day (BID) | ORAL | Status: DC

## 2021-07-06 MED ORDER — MELATONIN 5 MG PO TABS
5.0000 mg | ORAL_TABLET | Freq: Every day | ORAL | Status: DC
  Administered 2021-07-06: 22:00:00 5 mg via ORAL
  Filled 2021-07-06 (×2): qty 1

## 2021-07-06 MED ORDER — INSULIN DETEMIR 100 UNIT/ML ~~LOC~~ SOLN
10.0000 [IU] | Freq: Every day | SUBCUTANEOUS | Status: DC
  Filled 2021-07-06: qty 0.1

## 2021-07-06 MED ORDER — ACETAMINOPHEN 325 MG PO TABS: 650.0000 mg | ORAL_TABLET | Freq: Four times a day (QID) | ORAL | Status: DC | PRN

## 2021-07-06 MED ORDER — BISACODYL 5 MG PO TBEC: 5.0000 mg | DELAYED_RELEASE_TABLET | Freq: Every day | ORAL | Status: DC | PRN

## 2021-07-06 MED ORDER — DIVALPROEX SODIUM 125 MG PO CSDR: 125.0000 mg | DELAYED_RELEASE_CAPSULE | Freq: Three times a day (TID) | ORAL | Status: DC

## 2021-07-06 MED ORDER — GABAPENTIN 100 MG PO CAPS
100.0000 mg | ORAL_CAPSULE | Freq: Two times a day (BID) | ORAL | Status: DC
  Administered 2021-07-06 – 2021-07-07 (×2): 100 mg via ORAL
  Filled 2021-07-06 (×4): qty 1

## 2021-07-06 MED ORDER — SODIUM CHLORIDE 0.9 % IV SOLN: INTRAVENOUS | Status: DC

## 2021-07-06 MED ORDER — TAB-A-VITE/IRON PO TABS: 1.0000 | ORAL_TABLET | Freq: Every day | ORAL | Status: DC

## 2021-07-06 MED ORDER — APIXABAN 5 MG PO TABS
5.0000 mg | ORAL_TABLET | Freq: Two times a day (BID) | ORAL | Status: DC
  Administered 2021-07-06 – 2021-07-07 (×3): 5 mg via ORAL
  Filled 2021-07-06 (×4): qty 1

## 2021-07-06 MED ORDER — MAGNESIUM HYDROXIDE 400 MG/5ML PO SUSP
30.0000 mL | Freq: Every day | ORAL | Status: DC | PRN
  Filled 2021-07-06: qty 30

## 2021-07-06 MED ORDER — FAMOTIDINE 20 MG PO TABS
20.0000 mg | ORAL_TABLET | Freq: Every day | ORAL | Status: DC
  Administered 2021-07-06 – 2021-07-07 (×2): 20 mg via ORAL
  Filled 2021-07-06 (×2): qty 1

## 2021-07-06 MED ORDER — DOCUSATE SODIUM 100 MG PO CAPS: 100.0000 mg | ORAL_CAPSULE | Freq: Two times a day (BID) | ORAL | Status: DC

## 2021-07-06 MED ORDER — POLYETHYLENE GLYCOL 3350 17 G PO PACK
17.0000 g | PACK | Freq: Every day | ORAL | Status: DC
  Administered 2021-07-06 – 2021-07-07 (×2): 17 g via ORAL
  Filled 2021-07-06 (×2): qty 1

## 2021-07-06 MED ORDER — LISINOPRIL 10 MG PO TABS: 10.0000 mg | ORAL_TABLET | Freq: Every day | ORAL | Status: DC

## 2021-07-06 MED ORDER — CRANBERRY 450 MG PO TABS: 450.0000 mg | ORAL_TABLET | Freq: Every day | ORAL | Status: DC

## 2021-07-06 MED ORDER — TRAZODONE HCL 50 MG PO TABS: 25.0000 mg | ORAL_TABLET | Freq: Every evening | ORAL | Status: DC | PRN

## 2021-07-06 NOTE — ED Notes (Signed)
Pt eating breakfast and drinking 4oz of orange juice.

## 2021-07-06 NOTE — ED Notes (Signed)
Jacqui RN aware of assigned bed

## 2021-07-06 NOTE — Evaluation (Signed)
Physical Therapy Evaluation Patient Details Name: Yesenia Shepard MRN: [id number] DOB: 1951/03/13 Today's Date: 07/06/2021  History of Present Illness  70 y.o. Caucasian female with medical history significant for type 2 diabetes mellitus, hypertension, peripheral vascular disease, and anxiety and depression as well as hepatitis C, who presented to the emergency room with acute onset of fall a couple of days ago and altered mental status with confusion, hre SNF provider sent her to ED for further work up.  Clinical Impression  Pt showed ability to do some assistance with mobility but was limited and needed assist with all aspects of moving.  She reports that staff is available to help if needed and that there is also PT available in house.  Pt is w/c bound at baseline but reports that she is normally able to manage mobility with assist, however she lacks fully functional ROM in b/l LEs (seems that this is very likely baseline).  Pt appears to be mentally clear and aware of situation, and should be able to return home with SNF assist.  Will benefit from PT at facility to insure she maximizes independence and safety going forward as she is likely weaker and more limited than her baseline.     Recommendations for follow up therapy are one component of a multi-disciplinary discharge planning process, led by the attending physician.  Recommendations may be updated based on patient status, additional functional criteria and insurance authorization.  Follow Up Recommendations  (PT on return to SNF)     Assistance Recommended at Discharge Intermittent Supervision/Assistance  Functional Status Assessment Patient has had a recent decline in their functional status and/or demonstrates limited ability to make significant improvements in function in a reasonable and predictable amount of time  Equipment Recommendations  None recommended by PT    Recommendations for Other Services       Precautions /  Restrictions Restrictions Weight Bearing Restrictions: No      Mobility  Bed Mobility Overal bed mobility: Needs Assistance Bed Mobility: Sit to Supine;Supine to Sit     Supine to sit: Mod assist Sit to supine: Mod assist   General bed mobility comments: Pt was able to assist minimally with getting to sitting EOB but ultimately did need considerable assist to attain sitting and after some minimal EOB scooting get back to supine    Transfers                   General transfer comment: Pt neither stands at baseline nor has available LE ROM/strength toattempt even heavily assisted standing.  Pt far too short to make transfer attempt from ED gurney a safe proposition.    Ambulation/Gait               General Gait Details: w/c bound at baseline`  Stairs            Wheelchair Mobility    Modified Rankin (Stroke Patients Only)       Balance Overall balance assessment: Needs assistance Sitting-balance support: Bilateral upper extremity supported Sitting balance-Leahy Scale: Fair Sitting balance - Comments: Pt able to maintain sitting at EOB w/o assist, showed ability to do light unweighting and minimal side scooting EOB w/o LOBs                                     Pertinent Vitals/Pain Pain Assessment: 0-10 Pain Score: 2  Pain Location: "my bottom" reports  sore from laying in bed, assisted scooting up and positioning better in ED gurney    Home Living Family/patient expects to be discharged to:: Skilled nursing facility                        Prior Function Prior Level of Function : Needs assist             Mobility Comments: Pt reports that she is w/c bound but that she is normally able to transfer to/from commode, bed, etc w/o assist ADLs Comments: Pt reports that she is usually able to get dressed and manage self care w/o much assistance     Hand Dominance        Extremity/Trunk Assessment   Upper Extremity  Assessment Upper Extremity Assessment: Generalized weakness    Lower Extremity Assessment Lower Extremity Assessment: Generalized weakness (Pt with b/l LE stiffness (contractures?) with R ankle passively only to neutral and L ankle lacking neutral (in ~10 DF) and inability to attain fully extended knees, L more limited than R.)       Communication   Communication: No difficulties  Cognition Arousal/Alertness: Awake/alert Behavior During Therapy: WFL for tasks assessed/performed Overall Cognitive Status: Within Functional Limits for tasks assessed                                 General Comments: Pt knows date (said 28th, not 29th...) but aware of situation, location, etc with minimal extra cuing/prompting        General Comments General comments (skin integrity, edema, etc.): Pt reports that she typically is able to transfer, self-propel, get dressed, etc w/o much or any help from staff at her facility but that staff is available to help as needed    Exercises     Assessment/Plan    PT Assessment Patient needs continued PT services  PT Problem List Decreased strength;Decreased range of motion;Decreased activity tolerance;Decreased balance       PT Treatment Interventions Functional mobility training;Therapeutic activities;Therapeutic exercise;Balance training;Neuromuscular re-education;Patient/family education;DME instruction    PT Goals (Current goals can be found in the Care Plan section)  Acute Rehab PT Goals Patient Stated Goal: return to her facility PT Goal Formulation: With patient Time For Goal Achievement: 07/20/21 Potential to Achieve Goals: Fair    Frequency Min 2X/week   Barriers to discharge        Co-evaluation               AM-PAC PT "6 Clicks" Mobility  Outcome Measure Help needed turning from your back to your side while in a flat bed without using bedrails?: A Little Help needed moving from lying on your back to sitting on the  side of a flat bed without using bedrails?: A Lot Help needed moving to and from a bed to a chair (including a wheelchair)?: A Lot Help needed standing up from a chair using your arms (e.g., wheelchair or bedside chair)?: A Lot Help needed to walk in hospital room?: Total Help needed climbing 3-5 steps with a railing? : Total 6 Click Score: 11    End of Session Equipment Utilized During Treatment:  (Pt on rooom air on arrical, sats in the high 90s t/o session) Activity Tolerance: Patient tolerated treatment well (Pt starts to get tearful and when discussing how she spends all day in w/c) Patient left: in bed;with call bell/phone within reach Nurse Communication: Mobility status  PT Visit Diagnosis: Muscle weakness (generalized) (M62.81);History of falling (Z91.81)    Time: 2774-1287 PT Time Calculation (min) (ACUTE ONLY): 24 min   Charges:   PT Evaluation $PT Eval Low Complexity: 1 Low          Kreg Shropshire, DPT 07/06/2021, 9:02 AM

## 2021-07-06 NOTE — Plan of Care (Signed)
  Problem: Activity: Goal: Ability to tolerate increased activity will improve Outcome: Progressing   

## 2021-07-06 NOTE — ED Notes (Signed)
Admission MD at bedside.  

## 2021-07-06 NOTE — ED Notes (Signed)
Pt transferred from recliner chair to bed; gown placed on pt, peri care performed and brief and Purewick placed

## 2021-07-06 NOTE — Progress Notes (Addendum)
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  Brought from nursing facility subsequent to fall apparently 2 days prior-facility was concerned about intracranial injury and sent her to the ED for CT scan-noncontrast CT head showed ventriculomegaly no acute findings CXR mild diffuse interstitial prominence no focal consolidation  BUN/creatinine 35/1.8 White count 13.9 hemoglobin 11.0 platelet 375 Admitted for possible pneumonia  BP (!) 142/57    Pulse 69    Temp 97.9 F (36.6 C) (Rectal)    Resp 16    Ht 5\' 6"  (1.676 m)    Wt 86.2 kg    SpO2 91%    BMI 30.67 kg/m  Awake slightly slow mentation coherent knows that she is at hospital S1-S2 no murmur Abdomen soft Chest clear no rales no rhonchi ROM intact moving all 4 limbs equally   Tells me that what is new for her is that she is incontinent of urine and has been checked several times at the nursing facility for urinary tract infection She is a little slow in terms of mentation and has significant polypharmacy and risk of sedation  -Discontinue her Norco, cut back gabapentin from 300-100 every 12, discontinue trazodone 150 at bedtime She can continue her usual doses of methadone 25 daily I would space out her as needed methadone to every 8 as needed  We will continue empiric treatment for pneumonia with ceftriaxone and azithromycin but as procalcitonin and lactic acid are low I think that we can transition her to oral Levaquin today  she does not have any tenderness or swelling in her lower abdomen, and we can probably discharge her back to her facility as early as tomorrow if all remains stable  No charge   Verneita Griffes, MD [facility] 10:34 AM

## 2021-07-06 NOTE — H&P (Signed)
[place]   PATIENT NAME: Yesenia Shepard    MR#:  [id number]  DATE OF BIRTH:  09/28/1950  DATE OF ADMISSION:  07/06/2021  PRIMARY CARE PHYSICIAN: [name], [name], MD   Patient is coming from: [facility]  REQUESTING/REFERRING PHYSICIAN: Nance Pear, MD  CHIEF COMPLAINT:   Chief Complaint  Patient presents with   Fall   Altered Mental Status    HISTORY OF PRESENT ILLNESS:  Yesenia Shepard is a 70 y.o. Caucasian female with medical history significant for type 2 diabetes mellitus, hypertension, peripheral vascular disease, and anxiety and depression as well as hepatitis C, who presented to the emergency room with acute onset of fall a couple of days ago and altered mental status with confusion.  Her SNF facility ER provider wanted to get a head CT scan.  The patient admits to recent dry cough with associated dyspnea and occasional wheezing.  She denies any fever or chills.  No chest pain or palpitations.  No nausea or vomiting or abdominal pain or diarrhea.  No bleeding diathesis.  ED Course: When she came to the ER, blood pressure was 148/67 with a pulse oximetry of 88% on room air and 96% on 3 L of O2 by nasal cannula.  Labs revealed a BUN of 35 and creatinine 1.83, previously normal, albumin of 3.3 and total protein of 6.4.  High-sensitivity troponin I was 5 with BNP 57.7.  Lactic acid was 1.1 and procalcitonin less than 0.1.  Blood cultures were drawn. EKG as reviewed by me : EKG showed normal sinus rhythm with a rate of 75. Imaging: Noncontrasted head CT scan revealed stable age-related cerebral atrophy, ventriculomegaly and periventricular white matter disease with no acute intracranial findings. Portable chest ray showed mild diffuse interstitial prominence with no focal consolidation.  The patient was given IV Rocephin and Zithromax as well as hydration with IV lactated Ringer.  She will be admitted to a medical telemetry bed for further evaluation and  management. PAST MEDICAL HISTORY:   Past Medical History:  Diagnosis Date   Anxiety    Arthritis    Chronic pain    Depression    Diabetes mellitus without complication ([place])    Drug abuse ([place])    History of polysubstance abuse; currently on methadone.   Hepatitis    History of Hep "C". treated and cured with Harvoni   History of chicken pox    Hypertension    Panic attacks    Peripheral vascular disease ([place])    stent in place.    UTI (urinary tract infection)    History of    PAST SURGICAL HISTORY:   Past Surgical History:  Procedure Laterality Date   ABDOMINAL HYSTERECTOMY  1989   AMPUTATION TOE Left 10/26/2018   Procedure: 5TH RAY RESCTION;  Surgeon: Albertine Patricia, DPM;  Location: [facility];  Service: Podiatry;  Laterality: Left;   APPLICATION OF WOUND VAC N/A 02/05/2019   Procedure: APPLICATION OF WOUND VAC POSS DEBRIDEMENT;  Surgeon: Fredirick Maudlin, MD;  Location: [facility];  Service: General;  Laterality: N/A;   APPLICATION OF WOUND VAC  02/03/2019   Procedure: APPLICATION OF WOUND VAC;  Surgeon: Fredirick Maudlin, MD;  Location: [facility];  Service: General;;   CHOLECYSTECTOMY  1987   INTRACAPSULAR CATARACT EXTRACTION Left    IRRIGATION AND DEBRIDEMENT ABSCESS N/A 02/03/2019   Procedure: IRRIGATION AND DEBRIDEMENT SACRAL DECUBITUS;  Surgeon: Fredirick Maudlin, MD;  Location: [facility];  Service: General;  Laterality:  N/A;   LOWER EXTREMITY ANGIOGRAPHY Left 12/17/2017   Procedure: LOWER EXTREMITY ANGIOGRAPHY;  Surgeon: Katha Cabal, MD;  Location: [place] [facility];  Service: Cardiovascular;  Laterality: Left;   LOWER EXTREMITY ANGIOGRAPHY Left 10/24/2018   Procedure: LOWER EXTREMITY ANGIOGRAPHY;  Surgeon: Algernon Huxley, MD;  Location: [place] [facility];  Service: Cardiovascular;  Laterality: Left;   PERIPHERAL VASCULAR CATHETERIZATION Left 05/04/2015   Procedure: Lower Extremity Angiography;  Surgeon: Katha Cabal, MD;  Location: [place] [facility];  Service: Cardiovascular;  Laterality: Left;   PERIPHERAL VASCULAR CATHETERIZATION Left 05/04/2015   Procedure: Lower Extremity Intervention;  Surgeon: Katha Cabal, MD;  Location: [place] [facility];  Service: Cardiovascular;  Laterality: Left;   SHOULDER ARTHROSCOPY WITH OPEN ROTATOR CUFF REPAIR Right 01/16/2017   Procedure: SHOULDER ARTHROSCOPY WITH OPEN ROTATOR CUFF REPAIR;  Surgeon: Thornton Park, MD;  Location: [facility];  Service: Orthopedics;  Laterality: Right;   TONSILLECTOMY AND ADENOIDECTOMY  1971    SOCIAL HISTORY:   Social History   Tobacco Use   Smoking status: Former    Packs/day: 0.50    Years: 30.00    Pack years: 15.00    Types: Cigarettes    Quit date: 07/25/2018    Years since quitting: 2.9   Smokeless tobacco: Never  Substance Use Topics   Alcohol use: No    Alcohol/week: 0.0 standard drinks    FAMILY HISTORY:   Family History  Problem Relation Age of Onset   Arthritis Mother    Mental illness Mother        depression   Diabetes Mother    Cancer Mother        pancreatic   Cancer Father        colon   Heart disease Father    Diabetes Father    Cancer Daughter        lung    DRUG ALLERGIES:  No Known Allergies  REVIEW OF SYSTEMS:   ROS As per history of present illness. All pertinent systems were reviewed above. Constitutional, HEENT, cardiovascular, respiratory, GI, GU, musculoskeletal, neuro, psychiatric, endocrine, integumentary and hematologic systems were reviewed and are otherwise negative/unremarkable except for positive findings mentioned above in the HPI.   MEDICATIONS AT HOME:   Prior to Admission medications   Medication Sig Start Date End Date Taking? Authorizing Provider  acetaminophen (TYLENOL) 325 MG tablet Take 2 tablets (650 mg total) by mouth every 6 (six) hours as needed for mild pain (or Fever >/= 101). 11/03/18   Stegmayer, Joelene Millin A, PA-C  atorvastatin (LIPITOR) 40 MG tablet Take  1 tablet (40 mg total) by mouth daily. 04/10/18   [name], [name], MD  bisacodyl (DULCOLAX) 5 MG EC tablet Take 1 tablet (5 mg total) by mouth daily as needed for moderate constipation. 02/11/19   Stark Jock Jude, MD  cholecalciferol (VITAMIN D3) 25 MCG (1000 UT) tablet Take 1 tablet (1,000 Units total) by mouth daily. 11/04/18   Stegmayer, Joelene Millin A, PA-C  clonazepam (KLONOPIN) 0.125 MG disintegrating tablet Take 0.125 mg by mouth 2 (two) times daily.    [provider]  clopidogrel (PLAVIX) 75 MG tablet TAKE 1 TABLET BY MOUTH ONCE DAILY 12/23/17   Schnier, Dolores Lory, MD  divalproex (DEPAKOTE SPRINKLE) 125 MG capsule  09/11/19   [provider]  docusate sodium (COLACE) 100 MG capsule Take 1 capsule (100 mg total) by mouth 2 (two) times daily. 02/11/19   Otila Back, MD  ELIQUIS 5 MG  TABS tablet  03/27/19   [provider]  enoxaparin (LOVENOX) 60 MG/0.6ML injection  03/06/19   [provider]  escitalopram (LEXAPRO) 5 MG tablet Take 1 tablet (5 mg total) by mouth daily. 11/04/18   Stegmayer, Joelene Millin A, PA-C  famotidine (PEPCID) 20 MG tablet Take 1 tablet (20 mg total) by mouth daily. 02/12/19   Stark Jock Jude, MD  feeding supplement, GLUCERNA SHAKE, (GLUCERNA SHAKE) LIQD Take 237 mLs by mouth 2 (two) times daily between meals. 11/03/18   Stegmayer, Joelene Millin A, PA-C  ferrous sulfate 325 (65 FE) MG tablet Take 325 mg by mouth 2 (two) times daily.     [provider]  HYDROcodone-acetaminophen (NORCO/VICODIN) 5-325 MG tablet Take 1 tablet by mouth every 6 (six) hours as needed for moderate pain. 02/11/19   Otila Back, MD  KLOR-CON M10 10 MEQ tablet  07/27/19   [provider]  lisinopril (PRINIVIL,ZESTRIL) 10 MG tablet Take 1 tablet (10 mg total) by mouth daily. 04/23/18   [name], [name], MD  lisinopril (ZESTRIL) 20 MG tablet  04/12/19   [provider]  Melatonin 5 MG TABS Take 5 mg by mouth at bedtime.    [provider]  methadone  (DOLOPHINE) 10 MG/5ML solution Take 5 mg by mouth every 6 (six) hours as needed for pain. Give 65 ml oral    Juluis Pitch, MD  Multiple Vitamins-Iron (MULTIVITAMINS WITH IRON) TABS tablet Take 1 tablet by mouth daily. 11/04/18   Stegmayer, Janalyn Harder, PA-C  Polyethylene Glycol 3350 (MIRALAX PO) Take by mouth.    [provider]  potassium chloride (K-DUR) 10 MEQ tablet Take 10 mEq by mouth daily.    [provider]  sulfamethoxazole-trimethoprim (BACTRIM DS) 800-160 MG tablet  08/21/19   [provider]  traZODone (DESYREL) 150 MG tablet  09/08/19   [provider]  traZODone (DESYREL) 50 MG tablet Take 50 mg by mouth at bedtime.    [provider]  vitamin C (VITAMIN C) 250 MG tablet Take 1 tablet (250 mg total) by mouth 2 (two) times daily. 11/03/18   Stegmayer, Joelene Millin A, PA-C      VITAL SIGNS:  Blood pressure (!) 105/54, pulse 69, temperature 97.9 F (36.6 C), temperature source Rectal, resp. rate 17, height 5\' 6"  (1.676 m), weight 86.2 kg, SpO2 97 %.  PHYSICAL EXAMINATION:  Physical Exam  GENERAL:  70 y.o.-year-old Caucasian female  patient lying in the bed with mild respiratory distress with conversational dyspnea. EYES: Pupils equal, round, reactive to light and accommodation. No scleral icterus. Extraocular muscles intact.  HEENT: Head atraumatic, normocephalic. Oropharynx and nasopharynx clear.  NECK:  Supple, no jugular venous distention. No thyroid enlargement, no tenderness.  LUNGS: Diminished bibasilar breath sounds with mild bibasal crackles.. No use of accessory muscles of respiration.  CARDIOVASCULAR: Regular rate and rhythm, S1, S2 normal. No murmurs, rubs, or gallops.  ABDOMEN: Soft, nondistended, nontender. Bowel sounds present. No organomegaly or mass.  EXTREMITIES: No pedal edema, cyanosis, or clubbing.  NEUROLOGIC: Cranial nerves II through XII are intact. Muscle strength 5/5 in all extremities. Sensation intact. Gait not  checked.  PSYCHIATRIC: The patient is alert and oriented x 3.  Normal affect and good eye contact. SKIN: No obvious rash, lesion, or ulcer.   LABORATORY PANEL:   CBC Recent Labs  Lab 07/05/21 1530  WBC 13.9*  HGB 11.0*  HCT 35.3*  PLT 375   ------------------------------------------------------------------------------------------------------------------  Chemistries  Recent Labs  Lab 07/05/21 1702  NA 137  K 4.5  CL 102  CO2 26  GLUCOSE 96  BUN 35*  CREATININE 1.83*  CALCIUM 9.0  AST 14*  ALT 8  ALKPHOS 58  BILITOT 0.7   ------------------------------------------------------------------------------------------------------------------  Cardiac Enzymes No results for input(s): TROPONINI in the last 168 hours. ------------------------------------------------------------------------------------------------------------------  RADIOLOGY:  CT HEAD WO CONTRAST (5MM)  Result Date: 07/05/2021 CLINICAL DATA:  Head trauma. EXAM: CT HEAD WITHOUT CONTRAST TECHNIQUE: Contiguous axial images were obtained from the base of the skull through the vertex without intravenous contrast. COMPARISON:  10/20/2020 FINDINGS: Brain: Stable age related cerebral atrophy, ventriculomegaly and periventricular white matter disease. No extra-axial fluid collections are identified. No CT findings for acute hemispheric infarction or intracranial hemorrhage. No mass lesions. The brainstem and cerebellum are normal. Vascular: Stable vascular calcifications but no aneurysm or hyperdense vessels. Skull: No skull fracture or bone lesions. Sinuses/Orbits: The paranasal sinuses and mastoid air cells are clear. The globes are intact. Other: No scalp hematoma. IMPRESSION: 1. Stable age related cerebral atrophy, ventriculomegaly and periventricular white matter disease. 2. No acute intracranial findings or skull fracture. Electronically Signed   By: Marijo Sanes M.D.   On: 07/05/2021 16:08   DG Chest Portable 1  View  Result Date: 07/06/2021 CLINICAL DATA:  Hypoxia. EXAM: PORTABLE CHEST 1 VIEW COMPARISON:  Chest radiograph dated 01/28/2019. FINDINGS: Mild diffuse interstitial prominence may be chronic. Edema or atypical pneumonia is less likely but not excluded. Bibasilar atelectasis. No large pleural effusion. No pneumothorax. Borderline cardiomegaly. Atherosclerotic calcification of the aorta. No acute osseous pathology. IMPRESSION: Mild diffuse interstitial prominence.  No focal consolidation. Electronically Signed   By: Anner Crete M.D.   On: 07/06/2021 00:39      IMPRESSION AND PLAN:  Principal Problem:   Atypical pneumonia  1.  Atypical pneumonia with subsequent acute hypoxic respiratory failure. - The patient will be admitted to a medical telemetry bed. - We will continue antibiotic therapy with IV Rocephin and Zithromax. - O2 protocol will be followed. - She will be on duo nebs on a as needed basis as well as mucolytic therapy. -We will follow blood cultures and obtain sputum culture.  2.  Acute kidney injury, likely prerenal due to volume depletion and dehydration. - The patient will be hydrated with IV normal saline and will follow her BMP.  3.  Dyslipidemia. - We will continue statin therapy.  4.  Depression and anxiety. - We will continue Wellbutrin XL, Lexapro, Klonopin and Xanax.  5.  Type 2 diabetes mellitus. - The patient will be placed on supplement coverage with NovoLog. - We will hold off metformin.  6.  Essential hypertension. - We will hold off lisinopril given acute kidney injury. - We will place him on as needed IV labetalol.  DVT prophylaxis: Lovenox.  Code Status: The patient is DNR/DNI. Family Communication:  The plan of care was discussed in details with the patient (and family). I answered all questions. The patient agreed to proceed with the above mentioned plan. Further management will depend upon hospital course. Disposition Plan: Back to previous  home environment Consults called: none. All the records are reviewed and case discussed with ED provider.  Status is: Inpatient   At the time of the admission, it appears that the appropriate admission status for this patient is inpatient.  This is judged to be reasonable and necessary in order to provide the required intensity of service to ensure the patient's safety given the presenting symptoms, physical exam findings and initial radiographic and laboratory  data in the context of comorbid conditions.  The patient requires inpatient status due to high intensity of service, high risk of further deterioration and high frequency of surveillance required.  I certify that at the time of admission, it is my clinical judgment that the patient will require inpatient hospital care extending more than 2 midnights.                            Dispo: The patient is from: SNF              Anticipated d/c is to: SNF              Patient currently is not medically stable to d/c.              Difficult to place patient: No     Christel Mormon M.D on 07/06/2021 at 3:33 AM  [facility]   From 7 PM-7 AM, contact night-coverage [email or web address]  CC: Primary care physician; [name], [name], MD

## 2021-07-06 NOTE — ED Provider Notes (Signed)
[facility] Emergency Department Provider Note   ____________________________________________   I have reviewed the triage vital signs and the nursing notes.   HISTORY  Chief Complaint Fall and Altered Mental Status   History limited by and level 5 caveat due to: Altered mental status   HPI Yesenia Shepard is a 70 y.o. female who presents to the emergency department today from living facility because of concerns for altered mental status and fall. Per EMS run sheet the patient apparently had a fall in the bathroom 2 days ago. Confusion was noted today and provider at facility wanted patient sent out for CT scan.  Records reviewed. Per medical record review patient has a history of DM, cirrhosis.   Past Medical History:  Diagnosis Date   Anxiety    Arthritis    Chronic pain    Depression    Diabetes mellitus without complication ([place])    Drug abuse ([place])    History of polysubstance abuse; currently on methadone.   Hepatitis    History of Hep "C". treated and cured with Harvoni   History of chicken pox    Hypertension    Panic attacks    Peripheral vascular disease ([place])    stent in place.    UTI (urinary tract infection)    History of    Patient Active Problem List   Diagnosis Date Noted   Acquired absence of other left toe(s) ([place]) 10/20/2020   Decubitus ulcer, infected 01/28/2019   Pressure injury of skin 12/24/2018   Sepsis ([place]) 12/22/2018   CKD (chronic kidney disease), stage IV ([place]) 12/22/2018   UTI (urinary tract infection) 12/22/2018   Elevated CK 12/22/2018   Atherosclerotic peripheral vascular disease with ulceration ([place]) 10/24/2018   Atherosclerosis of artery of extremity with ulceration ([place]) 10/24/2018   Atherosclerosis of native arteries of extremity with rest pain ([place]) 10/01/2018   Chronic midline low back pain with bilateral sciatica 04/23/2018   Vitamin D deficiency 04/10/2018   Swelling of limb 03/01/2018    Atherosclerosis of native arteries of extremity with intermittent claudication ([place]) 01/03/2018   Leg pain 12/12/2017   S/P right rotator cuff repair 01/16/2017   Preoperative examination 01/14/2017   Full thickness rotator cuff tear 12/24/2016   Right shoulder pain 09/06/2016   Hepatic cirrhosis ([place]) 02/09/2016   Chronic hepatitis C without hepatic coma ([place]) 11/09/2015   HLD (hyperlipidemia) 08/20/2015   Peripheral vascular disease ([place]) 08/12/2015   Essential hypertension 08/12/2015   Type 2 diabetes mellitus with other specified complication, unspecified whether long term insulin use ([place]) 04/19/2015   Current smoker 03/15/2015   History of recreational drug use 03/15/2015   Osteoarthritis 03/15/2015   Preventative health care 03/15/2015   Ulcer of left lower leg, limited to breakdown of skin ([place]) 03/15/2015   Anxiety and depression 08/25/2012    Past Surgical History:  Procedure Laterality Date   ABDOMINAL HYSTERECTOMY  1989   AMPUTATION TOE Left 10/26/2018   Procedure: 5TH RAY RESCTION;  Surgeon: Albertine Patricia, DPM;  Location: [facility] OR[facility];  Service: Podiatry;  Laterality: Left;   APPLICATION OF WOUND VAC N/A 02/05/2019   Procedure: APPLICATION OF WOUND VAC POSS DEBRIDEMENT;  Surgeon: Fredirick Maudlin, MD;  Location: [facility];  Service: General;  Laterality: N/A;   APPLICATION OF WOUND VAC  02/03/2019   Procedure: APPLICATION OF WOUND VAC;  Surgeon: Fredirick Maudlin, MD;  Location: [facility];  Service: General;;   CHOLECYSTECTOMY  1987   INTRACAPSULAR CATARACT EXTRACTION Left  IRRIGATION AND DEBRIDEMENT ABSCESS N/A 02/03/2019   Procedure: IRRIGATION AND DEBRIDEMENT SACRAL DECUBITUS;  Surgeon: Fredirick Maudlin, MD;  Location: [facility];  Service: General;  Laterality: N/A;   LOWER EXTREMITY ANGIOGRAPHY Left 12/17/2017   Procedure: LOWER EXTREMITY ANGIOGRAPHY;  Surgeon: Katha Cabal, MD;  Location: [place] [facility];  Service: Cardiovascular;  Laterality: Left;   LOWER  EXTREMITY ANGIOGRAPHY Left 10/24/2018   Procedure: LOWER EXTREMITY ANGIOGRAPHY;  Surgeon: Algernon Huxley, MD;  Location: [place] [facility];  Service: Cardiovascular;  Laterality: Left;   PERIPHERAL VASCULAR CATHETERIZATION Left 05/04/2015   Procedure: Lower Extremity Angiography;  Surgeon: Katha Cabal, MD;  Location: [place] [facility];  Service: Cardiovascular;  Laterality: Left;   PERIPHERAL VASCULAR CATHETERIZATION Left 05/04/2015   Procedure: Lower Extremity Intervention;  Surgeon: Katha Cabal, MD;  Location: [place] [facility];  Service: Cardiovascular;  Laterality: Left;   SHOULDER ARTHROSCOPY WITH OPEN ROTATOR CUFF REPAIR Right 01/16/2017   Procedure: SHOULDER ARTHROSCOPY WITH OPEN ROTATOR CUFF REPAIR;  Surgeon: Thornton Park, MD;  Location: [facility];  Service: Orthopedics;  Laterality: Right;   TONSILLECTOMY AND ADENOIDECTOMY  1971    Prior to Admission medications   Medication Sig Start Date End Date Taking? Authorizing Provider  acetaminophen (TYLENOL) 325 MG tablet Take 2 tablets (650 mg total) by mouth every 6 (six) hours as needed for mild pain (or Fever >/= 101). 11/03/18   Stegmayer, Joelene Millin A, PA-C  atorvastatin (LIPITOR) 40 MG tablet Take 1 tablet (40 mg total) by mouth daily. 04/10/18   [name], [name], MD  bisacodyl (DULCOLAX) 5 MG EC tablet Take 1 tablet (5 mg total) by mouth daily as needed for moderate constipation. 02/11/19   Stark Jock Jude, MD  cholecalciferol (VITAMIN D3) 25 MCG (1000 UT) tablet Take 1 tablet (1,000 Units total) by mouth daily. 11/04/18   Stegmayer, Joelene Millin A, PA-C  clonazepam (KLONOPIN) 0.125 MG disintegrating tablet Take 0.125 mg by mouth 2 (two) times daily.    [provider]  clopidogrel (PLAVIX) 75 MG tablet TAKE 1 TABLET BY MOUTH ONCE DAILY 12/23/17   Schnier, Dolores Lory, MD  divalproex (DEPAKOTE SPRINKLE) 125 MG capsule  09/11/19   [provider]  docusate sodium (COLACE) 100 MG capsule Take 1 capsule (100 mg  total) by mouth 2 (two) times daily. 02/11/19   Otila Back, MD  ELIQUIS 5 MG TABS tablet  03/27/19   [provider]  enoxaparin (LOVENOX) 60 MG/0.6ML injection  03/06/19   [provider]  escitalopram (LEXAPRO) 5 MG tablet Take 1 tablet (5 mg total) by mouth daily. 11/04/18   Stegmayer, Joelene Millin A, PA-C  famotidine (PEPCID) 20 MG tablet Take 1 tablet (20 mg total) by mouth daily. 02/12/19   Stark Jock Jude, MD  feeding supplement, GLUCERNA SHAKE, (GLUCERNA SHAKE) LIQD Take 237 mLs by mouth 2 (two) times daily between meals. 11/03/18   Stegmayer, Joelene Millin A, PA-C  ferrous sulfate 325 (65 FE) MG tablet Take 325 mg by mouth 2 (two) times daily.     [provider]  HYDROcodone-acetaminophen (NORCO/VICODIN) 5-325 MG tablet Take 1 tablet by mouth every 6 (six) hours as needed for moderate pain. 02/11/19   Otila Back, MD  KLOR-CON M10 10 MEQ tablet  07/27/19   [provider]  lisinopril (PRINIVIL,ZESTRIL) 10 MG tablet Take 1 tablet (10 mg total) by mouth daily. 04/23/18   [name], [name], MD  lisinopril (ZESTRIL) 20 MG tablet  04/12/19   [provider]  Melatonin  5 MG TABS Take 5 mg by mouth at bedtime.    [provider]  methadone (DOLOPHINE) 10 MG/5ML solution Take 5 mg by mouth every 6 (six) hours as needed for pain. Give 65 ml oral    Juluis Pitch, MD  Multiple Vitamins-Iron (MULTIVITAMINS WITH IRON) TABS tablet Take 1 tablet by mouth daily. 11/04/18   Stegmayer, Janalyn Harder, PA-C  Polyethylene Glycol 3350 (MIRALAX PO) Take by mouth.    [provider]  potassium chloride (K-DUR) 10 MEQ tablet Take 10 mEq by mouth daily.    [provider]  sulfamethoxazole-trimethoprim (BACTRIM DS) 800-160 MG tablet  08/21/19   [provider]  traZODone (DESYREL) 150 MG tablet  09/08/19   [provider]  traZODone (DESYREL) 50 MG tablet Take 50 mg by mouth at bedtime.    [provider]  vitamin C (VITAMIN C) 250 MG  tablet Take 1 tablet (250 mg total) by mouth 2 (two) times daily. 11/03/18   Stegmayer, Janalyn Harder, PA-C    Allergies Patient has no known allergies.  Family History  Problem Relation Age of Onset   Arthritis Mother    Mental illness Mother        depression   Diabetes Mother    Cancer Mother        pancreatic   Cancer Father        colon   Heart disease Father    Diabetes Father    Cancer Daughter        lung    Social History Social History   Tobacco Use   Smoking status: Former    Packs/day: 0.50    Years: 30.00    Pack years: 15.00    Types: Cigarettes    Quit date: 07/25/2018    Years since quitting: 2.9   Smokeless tobacco: Never  Vaping Use   Vaping Use: Never used  Substance Use Topics   Alcohol use: No    Alcohol/week: 0.0 standard drinks   Drug use: Not Currently    Comment: Hx of.    Review of Systems Unable to obtain reliable ROS secondary to mental status.   ____________________________________________   PHYSICAL EXAM:  VITAL SIGNS: ED Triage Vitals [07/05/21 1527]  Enc Vitals Group     BP (!) 148/67     Pulse Rate 75     Resp 18     Temp 98.4 F (36.9 C)     Temp Source Oral     SpO2 96 %     Weight 190 lb 0.6 oz (86.2 kg)     Height 5\' 6"  (1.676 m)     Head Circumference      Peak Flow      Pain Score 0   Constitutional: Awake and alert. Not oriented.  Eyes: Conjunctivae are normal.  ENT      Head: Normocephalic and atraumatic.      Nose: No congestion/rhinnorhea.      Mouth/Throat: Mucous membranes are moist.      Neck: No stridor. Hematological/Lymphatic/Immunilogical: No cervical lymphadenopathy. Cardiovascular: Normal rate, regular rhythm.  No murmurs, rubs, or gallops.  Respiratory: Normal respiratory effort without tachypnea nor retractions. Breath sounds are clear and equal bilaterally. No wheezes/rales/rhonchi. Gastrointestinal: Soft and non tender. No rebound. No guarding.  Genitourinary: Deferred Musculoskeletal:  Normal range of motion in all extremities. No lower extremity edema. Neurologic:  Normal speech and language. No gross focal neurologic deficits are appreciated.  Skin:  Skin is warm, dry  and intact. No rash noted. Psychiatric: Mood and affect are normal. Speech and behavior are normal. Patient exhibits appropriate insight and judgment.  ____________________________________________    LABS (pertinent positives/negatives)  COVID/influenza negative CBC wbc 13.9, hgb 11.0, plt 375 CMP na 137, k 4.5, gl 102, cr 1.83  ____________________________________________   EKG  I, Nance Pear, attending physician, personally viewed and interpreted this EKG  EKG Time: 1534 Rate: 75 Rhythm: normal sinus rhythm Axis: normal Intervals: qtc 424 QRS: narrow ST changes: no st elevation Impression: normal ekg ____________________________________________    RADIOLOGY  CT head No acute intracranial process or osseous abnormality  ____________________________________________   PROCEDURES  Procedures  ____________________________________________   INITIAL IMPRESSION / ASSESSMENT AND PLAN / ED COURSE  Pertinent labs & imaging results that were available during my care of the patient were reviewed by me and considered in my medical decision making (see chart for details).   Patient presented to the emergency department today from living facility because of concerns for confusion and fall that occurred 2 days ago.  CT head without any concerning abnormalities.  However patient was found to be hypoxic on room air.  Chest x-ray is concerning for possible atypical pneumonia.  Patient did have slight leukocytosis on blood work.  Because of this will plan on starting antibiotics and admission to the hospital service.  ____________________________________________   FINAL CLINICAL IMPRESSION(S) / ED DIAGNOSES  Final diagnoses:  Confusion  Hypoxia  Atypical pneumonia     Note: This  dictation was prepared with Dragon dictation. Any transcriptional errors that result from this process are unintentional     Nance Pear, MD 07/06/21 0320

## 2021-07-06 NOTE — Progress Notes (Addendum)
Inpatient Diabetes Program Recommendations  AACE/ADA: New Consensus Statement on Inpatient Glycemic Control   Target Ranges:  Prepandial:   less than 140 mg/dL      Peak postprandial:   less than 180 mg/dL (1-2 hours)      Critically ill patients:  140 - 180 mg/dL    Latest Reference Range & Units 07/06/21 08:49  Glucose-Capillary 70 - 99 mg/dL 69 (L)    Latest Reference Range & Units 07/06/21 05:31  Glucose 70 - 99 mg/dL 92   Review of Glycemic Control  Diabetes history: DM2 Outpatient Diabetes medications: Levemir 10 units daily, Humalog 0-12 units TID with meals, Metformin 1000 mg daily Current orders for Inpatient glycemic control: Levemir 10 units daily, Novolog 0-9 units AC&HS  Inpatient Diabetes Program Recommendations:    Insulin: Per chart, no Levemir given at hospital on 07/05/21 and fasting CBG 69 mg/dl this morning. Please discontinue Levemir for now.  Thanks Barnie Alderman, RN, MSN, CDE Diabetes Coordinator Inpatient Diabetes Program [phone] (Team Pager from 8am to 5pm)

## 2021-07-07 ENCOUNTER — Inpatient Hospital Stay: Payer: Medicare HMO

## 2021-07-07 LAB — COMPREHENSIVE METABOLIC PANEL
ALT: 6 U/L (ref 0–44)
AST: 11 U/L — ABNORMAL LOW (ref 15–41)
Albumin: 3 g/dL — ABNORMAL LOW (ref 3.5–5.0)
Alkaline Phosphatase: 52 U/L (ref 38–126)
Anion gap: 7 (ref 5–15)
BUN: 23 mg/dL (ref 8–23)
CO2: 26 mmol/L (ref 22–32)
Calcium: 8.8 mg/dL — ABNORMAL LOW (ref 8.9–10.3)
Chloride: 108 mmol/L (ref 98–111)
Creatinine, Ser: 1.4 mg/dL — ABNORMAL HIGH (ref 0.44–1.00)
GFR, Estimated: 40 mL/min — ABNORMAL LOW (ref >60)
Glucose, Bld: 113 mg/dL — ABNORMAL HIGH (ref 70–99)
Potassium: 4.3 mmol/L (ref 3.5–5.1)
Sodium: 141 mmol/L (ref 135–145)
Total Bilirubin: 0.6 mg/dL (ref 0.3–1.2)
Total Protein: 6.4 g/dL — ABNORMAL LOW (ref 6.5–8.1)

## 2021-07-07 LAB — CBC WITH DIFFERENTIAL/PLATELET
Abs Immature Granulocytes: 0.02 10*3/uL (ref 0.00–0.07)
Basophils Absolute: 0 10*3/uL (ref 0.0–0.1)
Basophils Relative: 0 %
Eosinophils Absolute: 0.2 10*3/uL (ref 0.0–0.5)
Eosinophils Relative: 2 %
HCT: 30.8 % — ABNORMAL LOW (ref 36.0–46.0)
Hemoglobin: 9.6 g/dL — ABNORMAL LOW (ref 12.0–15.0)
Immature Granulocytes: 0 %
Lymphocytes Relative: 22 %
Lymphs Abs: 1.6 10*3/uL (ref 0.7–4.0)
MCH: 28.1 pg (ref 26.0–34.0)
MCHC: 31.2 g/dL (ref 30.0–36.0)
MCV: 90.1 fL (ref 80.0–100.0)
Monocytes Absolute: 0.6 10*3/uL (ref 0.1–1.0)
Monocytes Relative: 8 %
Neutro Abs: 4.6 10*3/uL (ref 1.7–7.7)
Neutrophils Relative %: 68 %
Platelets: 317 10*3/uL (ref 150–400)
RBC: 3.42 MIL/uL — ABNORMAL LOW (ref 3.87–5.11)
RDW: 12.3 % (ref 11.5–15.5)
WBC: 6.9 10*3/uL (ref 4.0–10.5)
nRBC: 0 % (ref 0.0–0.2)

## 2021-07-07 LAB — LEGIONELLA PNEUMOPHILA SEROGP 1 UR AG: L. pneumophila Serogp 1 Ur Ag: NEGATIVE

## 2021-07-07 LAB — GLUCOSE, CAPILLARY: Glucose-Capillary: 94 mg/dL (ref 70–99)

## 2021-07-07 LAB — URINE CULTURE

## 2021-07-07 MED ORDER — TRAZODONE HCL 50 MG PO TABS
25.0000 mg | ORAL_TABLET | Freq: Every evening | ORAL | 0 refills | Status: DC | PRN
Start: 2021-07-07 — End: 2021-09-05

## 2021-07-07 MED ORDER — METHADONE HCL 10 MG/5ML PO SOLN: 5.0000 mg | Freq: Three times a day (TID) | ORAL | 0 refills | Status: DC | PRN

## 2021-07-07 MED ORDER — LEVOFLOXACIN 500 MG PO TABS: 500.0000 mg | ORAL_TABLET | Freq: Every day | ORAL | 0 refills | Status: DC

## 2021-07-07 MED ORDER — METHADONE HCL INTENSOL 10 MG/ML PO CONC: 25.0000 mg | Freq: Every day | ORAL | 0 refills | Status: DC

## 2021-07-07 MED ORDER — GABAPENTIN 100 MG PO CAPS
100.0000 mg | ORAL_CAPSULE | Freq: Two times a day (BID) | ORAL | 0 refills | Status: AC
Start: 2021-07-07 — End: ?

## 2021-07-07 MED ORDER — ALPRAZOLAM ER 2 MG PO TB24: 2.0000 mg | ORAL_TABLET | Freq: Every day | ORAL | 0 refills | Status: DC

## 2021-07-07 NOTE — NC FL2 (Signed)
[place] LEVEL OF CARE SCREENING TOOL     IDENTIFICATION  Patient Name: Yesenia Shepard Birthdate: July 21, 1950 Sex: female Admission Date (Current Location): 07/06/2021  [facility] and [place] Number:  Engineering geologist and Address:  [facility], [address] [place], [place] [address]      Provider Number: [id number]  Attending Physician Name and Address:  Nita Sells, MD  Relative Name and Phone Number:       Current Level of Care: Hospital Recommended Level of Care: [place] Prior Approval Number:    Date Approved/Denied:   PASRR Number: [id number]  Discharge Plan: SNF    Current Diagnoses: Patient Active Problem List   Diagnosis Date Noted   Atypical pneumonia 07/06/2021   Acquired absence of other left toe(s) ([place]) 10/20/2020   Decubitus ulcer, infected 01/28/2019   Pressure injury of skin 12/24/2018   Sepsis ([place]) 12/22/2018   CKD (chronic kidney disease), stage IV ([place]) 12/22/2018   UTI (urinary tract infection) 12/22/2018   Elevated CK 12/22/2018   Atherosclerotic peripheral vascular disease with ulceration ([place]) 10/24/2018   Atherosclerosis of artery of extremity with ulceration ([place]) 10/24/2018   Atherosclerosis of native arteries of extremity with rest pain ([place]) 10/01/2018   Chronic midline low back pain with bilateral sciatica 04/23/2018   Vitamin D deficiency 04/10/2018   Swelling of limb 03/01/2018   Atherosclerosis of native arteries of extremity with intermittent claudication ([place]) 01/03/2018   Leg pain 12/12/2017   S/P right rotator cuff repair 01/16/2017   Preoperative examination 01/14/2017   Full thickness rotator cuff tear 12/24/2016   Right shoulder pain 09/06/2016   Hepatic cirrhosis ([place]) 02/09/2016   Chronic hepatitis C without hepatic coma ([place]) 11/09/2015   HLD (hyperlipidemia) 08/20/2015   Peripheral vascular disease ([place]) 08/12/2015   Essential hypertension  08/12/2015   Type 2 diabetes mellitus with other specified complication, unspecified whether long term insulin use ([place]) 04/19/2015   Current smoker 03/15/2015   History of recreational drug use 03/15/2015   Osteoarthritis 03/15/2015   Preventative health care 03/15/2015   Ulcer of left lower leg, limited to breakdown of skin ([place]) 03/15/2015   Anxiety and depression 08/25/2012    Orientation RESPIRATION BLADDER Height & Weight     Self, Time, Situation, Place  Normal Incontinent, External catheter Weight: 190 lb 0.6 oz (86.2 kg) Height:  5\' 6"  (167.6 cm)  BEHAVIORAL SYMPTOMS/MOOD NEUROLOGICAL BOWEL NUTRITION STATUS   (None)   Continent Diet (Carb modified)  AMBULATORY STATUS COMMUNICATION OF NEEDS Skin   Total Care Verbally Skin abrasions, Bruising                       Personal Care Assistance Level of Assistance              Functional Limitations Info  Sight, Hearing, Speech Sight Info: Adequate Hearing Info: Adequate Speech Info: Adequate    SPECIAL CARE FACTORS FREQUENCY  PT (By licensed PT)     PT Frequency: 5 x week              Contractures Contractures Info: Not present    Additional Factors Info  Code Status, Allergies, Isolation Precautions Code Status Info: Partial: Only administer ACLS medications if indicated. Allergies Info: NKDA     Isolation Precautions Info: Contact: VRE     Current Medications (07/07/2021):  This is the current hospital active medication list Current Facility-Administered Medications  Medication Dose Route Frequency Provider Last  Rate Last Admin   0.9 %  sodium chloride infusion   Intravenous Continuous Mansy, Kely A, MD 100 mL/hr at 07/07/21 0353 New Bag at 07/07/21 0353   acetaminophen (TYLENOL) tablet 650 mg  650 mg Oral Q6H PRN Mansy, Artis A, MD       Or   acetaminophen (TYLENOL) suppository 650 mg  650 mg Rectal Q6H PRN Mansy, Arvella Merles, MD       ALPRAZolam (XANAX XR) 24 hr tablet 2 mg  2 mg Oral Daily Mansy, Sadiya  A, MD   2 mg at 07/06/21 1138   apixaban (ELIQUIS) tablet 5 mg  5 mg Oral BID Mansy, Yuka A, MD   5 mg at 07/06/21 2132   ARIPiprazole (ABILIFY) tablet 5 mg  5 mg Oral QHS Mansy, Angelize A, MD   5 mg at 07/06/21 2132   bisacodyl (DULCOLAX) EC tablet 5 mg  5 mg Oral Daily PRN Mansy, Shella A, MD       Chlorhexidine Gluconate Cloth 2 % PADS 6 each  6 each Topical Daily Mansy, Robert A, MD       cholecalciferol (VITAMIN D3) tablet 1,000 Units  1,000 Units Oral Daily Mansy, Clydene A, MD   1,000 Units at 07/06/21 1140   clopidogrel (PLAVIX) tablet 75 mg  75 mg Oral Daily Mansy, Katena A, MD   75 mg at 07/06/21 1141   famotidine (PEPCID) tablet 20 mg  20 mg Oral Daily Mansy, Nicollette A, MD   20 mg at 07/06/21 1141   feeding supplement (GLUCERNA SHAKE) (GLUCERNA SHAKE) liquid 237 mL  237 mL Oral BID BM Mansy, Joelee A, MD   237 mL at 07/06/21 1141   ferrous sulfate tablet 325 mg  325 mg Oral BID Mansy, Lylliana A, MD   325 mg at 07/06/21 2132   gabapentin (NEURONTIN) capsule 100 mg  100 mg Oral Q12H Nita Sells, MD   100 mg at 07/06/21 2132   guaiFENesin (MUCINEX) 12 hr tablet 600 mg  600 mg Oral BID Mansy, Marijean A, MD   600 mg at 07/06/21 2132   insulin aspart (novoLOG) injection 0-9 Units  0-9 Units Subcutaneous TID [facility] Mansy, Raghad A, MD   1 Units at 07/06/21 1157   ipratropium-albuterol (DUONEB) 0.5-2.5 (3) MG/3ML nebulizer solution 3 mL  3 mL Nebulization QID Mansy, Kashari A, MD   3 mL at 07/07/21 0751   levofloxacin (LEVAQUIN) tablet 500 mg  500 mg Oral Daily Nita Sells, MD   500 mg at 07/07/21 0523   magnesium hydroxide (MILK OF MAGNESIA) suspension 30 mL  30 mL Oral Daily PRN Mansy, Marykay A, MD       melatonin tablet 5 mg  5 mg Oral QHS Mansy, Jacqulyn A, MD   5 mg at 07/06/21 2132   methadone (DOLOPHINE) 10 MG/ML solution 25 mg  25 mg Oral Daily Mansy, Kelsey A, MD   25 mg at 07/06/21 1142   methadone (DOLOPHINE) 10 MG/ML solution 5 mg  5 mg Oral Q8H PRN Nita Sells, MD       ondansetron (ZOFRAN) tablet 4 mg   4 mg Oral Q6H PRN Mansy, Ariele A, MD       Or   ondansetron [facility]) injection 4 mg  4 mg Intravenous Q6H PRN Mansy, Shemeka A, MD       polyethylene glycol (MIRALAX / GLYCOLAX) packet 17 g  17 g Oral Daily Mansy, Olina A, MD   17 g at 07/06/21  1142   potassium chloride (KLOR-CON M) CR tablet 10 mEq  10 mEq Oral Daily Mansy, Denessa A, MD   10 mEq at 07/06/21 1142   traZODone (DESYREL) tablet 25 mg  25 mg Oral QHS PRN Mansy, Arvella Merles, MD         Discharge Medications: Please see discharge summary for a list of discharge medications.  Relevant Imaging Results:  Relevant Lab Results:   Additional Information SS#: [id number]  Candie Chroman, LCSW

## 2021-07-07 NOTE — Progress Notes (Deleted)
Physician Discharge Summary  Jeanell Sparrow WEX:[id number] DOB: 27-Oct-1950 DOA: 07/06/2021  PCP: [name], [name], MD  Admit date: 07/06/2021 Discharge date: 07/07/2021  Time spent: 45 minutes  Recommendations for Outpatient Follow-up:  Need OP continued de-escalation of meds Complete levaquin as OP  Discharge Diagnoses:  MAIN problem for hospitalization   Fall  Polypharmacy  Please see below for itemized issues addressed in HOpsital- refer to other progress notes for clarity if needed  Discharge Condition: Improved  Diet recommendation: Heart healthy  Filed Weights   07/05/21 1527  Weight: 86.2 kg    History of present illness:  70 year old white female resident of [facility] known hepatitis C Chronic pain syndrome on methadon Pvd on plavix 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 Depression Prior stage II sacral decubitus ulcer 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   Brought from nursing facility subsequent to fall apparently 2 days prior-facility was concerned about intracranial injury and sent her to the ED for CT scan-noncontrast CT head showed ventriculomegaly no acute findings CXR mild diffuse interstitial prominence no focal consolidation   BUN/creatinine 35/1.8 White count 13.9 hemoglobin 11.0 platelet 375 Admitted for possible pneumonia  On admission she was encephalopathic but it was noted she was on high doses of Norco gabapentin methadone trazodone and we de-escalate some of these meds It was felt that she may have had a potential pneumonia on admission  It was unclear as to whether the patient had a UTI--for this reason we will cover her with Levaquin for 2 more days in the outpatient setting and she can discharge back to [facility] without any event later today  Discharge Exam: Vitals:   07/07/21 0421 07/07/21 0726  BP: (!) 156/68 (!) 150/63  Pulse: 72 60  Resp: 16 16  Temp: 98.4 F (36.9 C) 98.2 F (36.8 C)  SpO2: 97% 97%    Subj on day of d/c   well no  distress  General Exam on discharge  EOMI NCAT no focal deficit much more coherent S1-S2 no murmur no rub no gallop Chest clear no rales rhonchi Abdomen soft ROM intact moving all 4 limbs equally no focal deficit  Discharge Instructions   Discharge Instructions     Diet - low sodium heart healthy   Complete by: As directed    Increase activity slowly   Complete by: As directed       Allergies as of 07/07/2021   No Known Allergies      Medication List     STOP taking these medications    ascorbic acid 250 MG tablet Commonly known as: VITAMIN C   bisacodyl 5 MG EC tablet Commonly known as: DULCOLAX   clonazepam 0.125 MG disintegrating tablet Commonly known as: KLONOPIN   Cranberry 450 MG Tabs   divalproex 125 MG capsule Commonly known as: DEPAKOTE SPRINKLE   docusate sodium 100 MG capsule Commonly known as: COLACE   enoxaparin 60 MG/0.6ML injection Commonly known as: LOVENOX   escitalopram 5 MG tablet Commonly known as: LEXAPRO   HYDROcodone-acetaminophen 5-325 MG tablet Commonly known as: NORCO/VICODIN   Klor-Con M10 10 MEQ tablet Generic drug: potassium chloride   multivitamins with iron Tabs tablet   potassium chloride 10 MEQ tablet Commonly known as: KLOR-CON   sulfamethoxazole-trimethoprim 800-160 MG tablet Commonly known as: BACTRIM DS       TAKE these medications    acetaminophen 325 MG tablet Commonly known as: TYLENOL Take 2 tablets (650 mg total) by mouth every 6 (six) hours as  needed for mild pain (or Fever >/= 101).   ALPRAZolam 2 MG 24 hr tablet Commonly known as: XANAX XR Take 1 tablet (2 mg total) by mouth daily. Take after breakfast   ARIPiprazole 5 MG tablet Commonly known as: ABILIFY Take 5 mg by mouth at bedtime.   atorvastatin 40 MG tablet Commonly known as: LIPITOR Take 1 tablet (40 mg total) by mouth daily.   cholecalciferol 25 MCG (1000 UNIT) tablet Commonly known as: VITAMIN D3 Take 1 tablet (1,000 Units  total) by mouth daily.   clopidogrel 75 MG tablet Commonly known as: PLAVIX TAKE 1 TABLET BY MOUTH ONCE DAILY   Eliquis 5 MG Tabs tablet Generic drug: apixaban Take 5 mg by mouth 2 (two) times daily.   famotidine 20 MG tablet Commonly known as: PEPCID Take 1 tablet (20 mg total) by mouth daily.   feeding supplement (GLUCERNA SHAKE) Liqd Take 237 mLs by mouth 2 (two) times daily between meals.   ferrous sulfate 325 (65 FE) MG tablet Take 325 mg by mouth 2 (two) times daily.   gabapentin 100 MG capsule Commonly known as: NEURONTIN Take 1 capsule (100 mg total) by mouth every 12 (twelve) hours. What changed:  medication strength how much to take   insulin lispro 100 UNIT/ML KwikPen Commonly known as: HUMALOG Inject into the skin. Inject subcutaneously before meals per sliding scale. If BS 100-150; 2 units, 201-250; 4 units, 251-300; 6 units, 301-350; 8 units, 351-400; 10 units, 401-450; 12 units. If greater than 450 call MD.   Levemir FlexTouch 100 UNIT/ML FlexPen Generic drug: insulin detemir Inject 10 Units into the skin daily.   levofloxacin 500 MG tablet Commonly known as: LEVAQUIN Take 1 tablet (500 mg total) by mouth daily. Start taking on: July 08, 2021   lisinopril 5 MG tablet Commonly known as: ZESTRIL Take 5 mg by mouth daily. What changed: Another medication with the same name was removed. Continue taking this medication, and follow the directions you see here.   melatonin 5 MG Tabs Take 5 mg by mouth at bedtime.   metFORMIN 1000 MG tablet Commonly known as: GLUCOPHAGE Take 1,000 mg by mouth daily. Take with a meal.   methadone 10 MG/5ML solution Commonly known as: DOLOPHINE Take 2.5 mLs (5 mg total) by mouth every 8 (eight) hours as needed for pain. Give 65 ml oral What changed: when to take this   Methadone HCl Intensol 10 MG/ML solution Generic drug: methadone Take 2.5 mLs (25 mg total) by mouth daily. What changed: how much to take    MIRALAX PO Take 17 g by mouth daily. Mix in 4 to 8 oz of fluid of choice.   traZODone 150 MG tablet Commonly known as: DESYREL Take 150 mg by mouth at bedtime. What changed: Another medication with the same name was changed. Make sure you understand how and when to take each.   traZODone 50 MG tablet Commonly known as: DESYREL Take 0.5 tablets (25 mg total) by mouth at bedtime as needed for sleep. What changed:  how much to take when to take this reasons to take this       No Known Allergies    The results of significant diagnostics from this hospitalization (including imaging, microbiology, ancillary and laboratory) are listed below for reference.    Significant Diagnostic Studies: DG Chest 2 View  Result Date: 07/07/2021 CLINICAL DATA:  70 year old female with history of pneumonia. Recent history of fall. EXAM: CHEST - 2 VIEW COMPARISON:  Chest  x-ray 07/06/2021. FINDINGS: Lung volumes are normal. No consolidative airspace disease. No pleural effusions. No pneumothorax. No evidence of pulmonary edema. Mild cardiomegaly. Atherosclerotic calcifications are noted in the thoracic aorta. Upper mediastinal contours are within normal limits allowing for patient rotation to the right. Old healed distal third of right clavicle fracture with chronic posttraumatic deformity. IMPRESSION: 1. No radiographic evidence of acute cardiopulmonary disease. 2. Mild cardiomegaly. 3. Aortic atherosclerosis. Electronically Signed   By: Vinnie Langton M.D.   On: 07/07/2021 07:02   CT HEAD WO CONTRAST (5MM)  Result Date: 07/05/2021 CLINICAL DATA:  Head trauma. EXAM: CT HEAD WITHOUT CONTRAST TECHNIQUE: Contiguous axial images were obtained from the base of the skull through the vertex without intravenous contrast. COMPARISON:  10/20/2020 FINDINGS: Brain: Stable age related cerebral atrophy, ventriculomegaly and periventricular white matter disease. No extra-axial fluid collections are identified. No CT  findings for acute hemispheric infarction or intracranial hemorrhage. No mass lesions. The brainstem and cerebellum are normal. Vascular: Stable vascular calcifications but no aneurysm or hyperdense vessels. Skull: No skull fracture or bone lesions. Sinuses/Orbits: The paranasal sinuses and mastoid air cells are clear. The globes are intact. Other: No scalp hematoma. IMPRESSION: 1. Stable age related cerebral atrophy, ventriculomegaly and periventricular white matter disease. 2. No acute intracranial findings or skull fracture. Electronically Signed   By: Marijo Sanes M.D.   On: 07/05/2021 16:08   DG Chest Portable 1 View  Result Date: 07/06/2021 CLINICAL DATA:  Hypoxia. EXAM: PORTABLE CHEST 1 VIEW COMPARISON:  Chest radiograph dated 01/28/2019. FINDINGS: Mild diffuse interstitial prominence may be chronic. Edema or atypical pneumonia is less likely but not excluded. Bibasilar atelectasis. No large pleural effusion. No pneumothorax. Borderline cardiomegaly. Atherosclerotic calcification of the aorta. No acute osseous pathology. IMPRESSION: Mild diffuse interstitial prominence.  No focal consolidation. Electronically Signed   By: Anner Crete M.D.   On: 07/06/2021 00:39    Microbiology: Recent Results (from the past 240 hour(s))  Resp Panel by RT-PCR (Flu A&B, Covid) Nasopharyngeal Swab     Status: None   Collection Time: 07/05/21  3:32 PM   Specimen: Nasopharyngeal Swab; Nasopharyngeal(NP) swabs in vial transport medium  Result Value Ref Range Status   SARS Coronavirus 2 by RT PCR NEGATIVE NEGATIVE Final    Comment: (NOTE) SARS-CoV-2 target nucleic acids are NOT DETECTED.  The SARS-CoV-2 RNA is generally detectable in upper respiratory specimens during the acute phase of infection. The lowest concentration of SARS-CoV-2 viral copies this assay can detect is 138 copies/mL. A negative result does not preclude SARS-Cov-2 infection and should not be used as the sole basis for treatment  or other patient management decisions. A negative result may occur with  improper specimen collection/handling, submission of specimen other than nasopharyngeal swab, presence of viral mutation(s) within the areas targeted by this assay, and inadequate number of viral copies(<138 copies/mL). A negative result must be combined with clinical observations, patient history, and epidemiological information. The expected result is Negative.  Fact Sheet for Patients:  EntrepreneurPulse.com.au  Fact Sheet for Healthcare Providers:  IncredibleEmployment.be  This test is no t yet approved or cleared by the [place] FDA and  has been authorized for detection and/or diagnosis of SARS-CoV-2 by FDA under an Emergency Use Authorization (EUA). This EUA will remain  in effect (meaning this test can be used) for the duration of the COVID-19 declaration under Section 564(b)(1) of the Act, 21 U.S.C.section 360bbb-3(b)(1), unless the authorization is terminated  or revoked sooner.  Influenza A by PCR NEGATIVE NEGATIVE Final   Influenza B by PCR NEGATIVE NEGATIVE Final    Comment: (NOTE) The Xpert Xpress SARS-CoV-2/FLU/RSV plus assay is intended as an aid in the diagnosis of influenza from Nasopharyngeal swab specimens and should not be used as a sole basis for treatment. Nasal washings and aspirates are unacceptable for Xpert Xpress SARS-CoV-2/FLU/RSV testing.  Fact Sheet for Patients: EntrepreneurPulse.com.au  Fact Sheet for Healthcare Providers: IncredibleEmployment.be  This test is not yet approved or cleared by the [place] FDA and has been authorized for detection and/or diagnosis of SARS-CoV-2 by FDA under an Emergency Use Authorization (EUA). This EUA will remain in effect (meaning this test can be used) for the duration of the COVID-19 declaration under Section 564(b)(1) of the Act, 21 U.S.C. section  360bbb-3(b)(1), unless the authorization is terminated or revoked.  Performed at [facility], [address] [place], [place] [address]   Blood culture (routine x 2)     Status: None (Preliminary result)   Collection Time: 07/06/21  1:36 AM   Specimen: BLOOD  Result Value Ref Range Status   Specimen Description BLOOD RIGHT ASSIST CONTROL  Final   Special Requests   Final    BOTTLES DRAWN AEROBIC AND ANAEROBIC Blood Culture adequate volume   Culture   Final    NO GROWTH 1 DAY Performed at [facility], [address], [place], [place] [address]    Report Status PENDING  Incomplete  Blood culture (routine x 2)     Status: None (Preliminary result)   Collection Time: 07/06/21  1:38 AM   Specimen: BLOOD  Result Value Ref Range Status   Specimen Description BLOOD LEFT FOREARM  Final   Special Requests   Final    BOTTLES DRAWN AEROBIC AND ANAEROBIC Blood Culture adequate volume   Culture   Final    NO GROWTH 1 DAY Performed at [facility], [address] [place], [place] [address]    Report Status PENDING  Incomplete  MRSA Next Gen by PCR, Nasal     Status: None   Collection Time: 07/06/21  6:45 PM   Specimen: Nasal Mucosa; Nasal Swab  Result Value Ref Range Status   MRSA by PCR Next Gen NOT DETECTED NOT DETECTED Final    Comment: (NOTE) The GeneXpert MRSA Assay (FDA approved for NASAL specimens only), is one component of a comprehensive MRSA colonization surveillance program. It is not intended to diagnose MRSA infection nor to guide or monitor treatment for MRSA infections. Test performance is not FDA approved in patients less than 6 years old. Performed at [facility], [address] [place], [place] [address]      Labs: Basic Metabolic Panel: Recent Labs  Lab 07/05/21 1702 07/06/21 0531 07/07/21 0602  NA 137 138 141  K 4.5 4.3 4.3  CL 102 104 108  CO2 26 26 26   GLUCOSE 96 92 113*  BUN 35* 32* 23  CREATININE 1.83*  1.62* 1.40*  CALCIUM 9.0 8.7* 8.8*   Liver Function Tests: Recent Labs  Lab 07/05/21 1702 07/07/21 0602  AST 14* 11*  ALT 8 6  ALKPHOS 58 52  BILITOT 0.7 0.6  PROT 6.4* 6.4*  ALBUMIN 3.3* 3.0*   No results for input(s): LIPASE, AMYLASE in the last 168 hours. Recent Labs  Lab 07/06/21 0136  AMMONIA 26   CBC: Recent Labs  Lab 07/05/21 1530 07/06/21 0531 07/07/21 0602  WBC 13.9* 6.7 6.9  NEUTROABS  --   --  4.6  HGB 11.0* 10.1* 9.6*  HCT 35.3* 32.1* 30.8*  MCV 89.8 91.7 90.1  PLT 375 314 317   Cardiac Enzymes: No results for input(s): CKTOTAL, CKMB, CKMBINDEX, TROPONINI in the last 168 hours. BNP: BNP (last 3 results) Recent Labs    07/06/21 0136  BNP 57.7    ProBNP (last 3 results) No results for input(s): PROBNP in the last 8760 hours.  CBG: Recent Labs  Lab 07/06/21 1157 07/06/21 1624 07/06/21 1759 07/06/21 2052 07/07/21 0729  GLUCAP 131* 117* 113* 111* 94       Signed:  Nita Sells MD   [facility] 07/07/2021, 9:30 AM

## 2021-07-07 NOTE — TOC Transition Note (Addendum)
Transition of Care [facility]) - CM/SW Discharge Note   Patient Details  Name: Yesenia Shepard MRN: [id number] Date of Birth: Oct 22, 1950  Transition of Care [facility] CM/SW Contact:  Candie Chroman, LCSW Phone Number: 07/07/2021, 12:14 PM   Clinical Narrative:   Patient has orders to discharge back to [facility] today. Patient is a long-term resident there and said she has lived there about two years. PT recommending rehab and patient is agreeable. SNF admissions coordinator will start insurance authorization once she returns. RN will call report to [phone] (Room 302A). EMS transport has been arranged and she is 1st on the list. COVID test done on 12/28 so SNF said she didn't need another one. No further concerns. CSW signing off.  Final next level of care: Skilled Nursing Facility Barriers to Discharge: No Barriers Identified   Patient Goals and CMS Choice     Choice offered to / list presented to : NA  Discharge Placement   Existing PASRR number confirmed : 07/07/21          Patient chooses bed at: [facility] [place] Patient to be transferred to facility by: EMS Name of family member notified: Jeannine Boga Patient and family notified of of transfer: 07/07/21  Discharge Plan and Services                                     Social Determinants of Health (SDOH) Interventions     Readmission Risk Interventions Readmission Risk Prevention Plan 07/07/2021 01/30/2019  Transportation Screening Complete Complete  PCP or Specialist Appt within 3-5 Days Complete -  Social Work Consult for [place] Planning/Counseling Complete -  [place] Screening Not Applicable -  Medication Review Press photographer) Complete Complete  HRI or [place] - Complete  SW Recovery Care/Counseling Consult - Complete  Palliative Care Screening - Not [place] - Complete  Some recent data might be hidden

## 2021-07-08 NOTE — Discharge Summary (Signed)
Physician Discharge Summary  Yesenia Shepard BHA:[id number] DOB: 1951-05-20 DOA: 07/06/2021  PCP: [name], [name], MD  Admit date: 07/06/2021 Discharge date: 07/08/2021  Time spent: 45 minutes  Recommendations for Outpatient Follow-up:  Need OP continued de-escalation of meds Complete levaquin as OP  Discharge Diagnoses:  MAIN problem for hospitalization   Fall  Polypharmacy  Please see below for itemized issues addressed in HOpsital- refer to other progress notes for clarity if needed  Discharge Condition: Improved  Diet recommendation: Heart healthy  Filed Weights   07/05/21 1527  Weight: 86.2 kg    History of present illness:  70 year old white female resident of [facility] known hepatitis C Chronic pain syndrome on methadon Pvd on plavix 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 Depression Prior stage II sacral decubitus ulcer 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   Brought from nursing facility subsequent to fall apparently 2 days prior-facility was concerned about intracranial injury and sent her to the ED for CT scan-noncontrast CT head showed ventriculomegaly no acute findings CXR mild diffuse interstitial prominence no focal consolidation   BUN/creatinine 35/1.8 White count 13.9 hemoglobin 11.0 platelet 375 Admitted for possible pneumonia  On admission she was encephalopathic but it was noted she was on high doses of Norco gabapentin methadone trazodone and we de-escalate some of these meds It was felt that she may have had a potential pneumonia on admission  It was unclear as to whether the patient had a UTI--for this reason we will cover her with Levaquin for 2 more days in the outpatient setting and she can discharge back to [facility] without any event later today  Discharge Exam: Vitals:   07/07/21 0726 07/07/21 0751  BP: (!) 150/63   Pulse: 60   Resp: 16   Temp: 98.2 F (36.8 C)   SpO2: 97% 94%    Subj on day of d/c   well no distress  General Exam on  discharge  EOMI NCAT no focal deficit much more coherent S1-S2 no murmur no rub no gallop Chest clear no rales rhonchi Abdomen soft ROM intact moving all 4 limbs equally no focal deficit  Discharge Instructions   Discharge Instructions     Diet - low sodium heart healthy   Complete by: As directed    Increase activity slowly   Complete by: As directed       Allergies as of 07/07/2021   No Known Allergies      Medication List     STOP taking these medications    ascorbic acid 250 MG tablet Commonly known as: VITAMIN C   bisacodyl 5 MG EC tablet Commonly known as: DULCOLAX   clonazepam 0.125 MG disintegrating tablet Commonly known as: KLONOPIN   Cranberry 450 MG Tabs   divalproex 125 MG capsule Commonly known as: DEPAKOTE SPRINKLE   docusate sodium 100 MG capsule Commonly known as: COLACE   enoxaparin 60 MG/0.6ML injection Commonly known as: LOVENOX   escitalopram 5 MG tablet Commonly known as: LEXAPRO   HYDROcodone-acetaminophen 5-325 MG tablet Commonly known as: NORCO/VICODIN   Klor-Con M10 10 MEQ tablet Generic drug: potassium chloride   multivitamins with iron Tabs tablet   potassium chloride 10 MEQ tablet Commonly known as: KLOR-CON   sulfamethoxazole-trimethoprim 800-160 MG tablet Commonly known as: BACTRIM DS       TAKE these medications    acetaminophen 325 MG tablet Commonly known as: TYLENOL Take 2 tablets (650 mg total) by mouth every 6 (six) hours as needed for mild pain (  or Fever >/= 101).   ALPRAZolam 2 MG 24 hr tablet Commonly known as: XANAX XR Take 1 tablet (2 mg total) by mouth daily. Take after breakfast   ARIPiprazole 5 MG tablet Commonly known as: ABILIFY Take 5 mg by mouth at bedtime.   atorvastatin 40 MG tablet Commonly known as: LIPITOR Take 1 tablet (40 mg total) by mouth daily.   cholecalciferol 25 MCG (1000 UNIT) tablet Commonly known as: VITAMIN D3 Take 1 tablet (1,000 Units total) by mouth daily.    clopidogrel 75 MG tablet Commonly known as: PLAVIX TAKE 1 TABLET BY MOUTH ONCE DAILY   Eliquis 5 MG Tabs tablet Generic drug: apixaban Take 5 mg by mouth 2 (two) times daily.   famotidine 20 MG tablet Commonly known as: PEPCID Take 1 tablet (20 mg total) by mouth daily.   feeding supplement (GLUCERNA SHAKE) Liqd Take 237 mLs by mouth 2 (two) times daily between meals.   ferrous sulfate 325 (65 FE) MG tablet Take 325 mg by mouth 2 (two) times daily.   gabapentin 100 MG capsule Commonly known as: NEURONTIN Take 1 capsule (100 mg total) by mouth every 12 (twelve) hours. What changed:  medication strength how much to take   insulin lispro 100 UNIT/ML KwikPen Commonly known as: HUMALOG Inject into the skin. Inject subcutaneously before meals per sliding scale. If BS 100-150; 2 units, 201-250; 4 units, 251-300; 6 units, 301-350; 8 units, 351-400; 10 units, 401-450; 12 units. If greater than 450 call MD.   Levemir FlexTouch 100 UNIT/ML FlexPen Generic drug: insulin detemir Inject 10 Units into the skin daily.   levofloxacin 500 MG tablet Commonly known as: LEVAQUIN Take 1 tablet (500 mg total) by mouth daily.   lisinopril 5 MG tablet Commonly known as: ZESTRIL Take 5 mg by mouth daily. What changed: Another medication with the same name was removed. Continue taking this medication, and follow the directions you see here.   melatonin 5 MG Tabs Take 5 mg by mouth at bedtime.   metFORMIN 1000 MG tablet Commonly known as: GLUCOPHAGE Take 1,000 mg by mouth daily. Take with a meal.   methadone 10 MG/5ML solution Commonly known as: DOLOPHINE Take 2.5 mLs (5 mg total) by mouth every 8 (eight) hours as needed for pain. Give 65 ml oral What changed: when to take this   Methadone HCl Intensol 10 MG/ML solution Generic drug: methadone Take 2.5 mLs (25 mg total) by mouth daily. What changed: how much to take   MIRALAX PO Take 17 g by mouth daily. Mix in 4 to 8 oz of fluid of  choice.   traZODone 150 MG tablet Commonly known as: DESYREL Take 150 mg by mouth at bedtime. What changed: Another medication with the same name was changed. Make sure you understand how and when to take each.   traZODone 50 MG tablet Commonly known as: DESYREL Take 0.5 tablets (25 mg total) by mouth at bedtime as needed for sleep. What changed:  how much to take when to take this reasons to take this       No Known Allergies    The results of significant diagnostics from this hospitalization (including imaging, microbiology, ancillary and laboratory) are listed below for reference.    Significant Diagnostic Studies: DG Chest 2 View  Result Date: 07/07/2021 CLINICAL DATA:  70 year old female with history of pneumonia. Recent history of fall. EXAM: CHEST - 2 VIEW COMPARISON:  Chest x-ray 07/06/2021. FINDINGS: Lung volumes are normal. No consolidative airspace  disease. No pleural effusions. No pneumothorax. No evidence of pulmonary edema. Mild cardiomegaly. Atherosclerotic calcifications are noted in the thoracic aorta. Upper mediastinal contours are within normal limits allowing for patient rotation to the right. Old healed distal third of right clavicle fracture with chronic posttraumatic deformity. IMPRESSION: 1. No radiographic evidence of acute cardiopulmonary disease. 2. Mild cardiomegaly. 3. Aortic atherosclerosis. Electronically Signed   By: Vinnie Langton M.D.   On: 07/07/2021 07:02   CT HEAD WO CONTRAST (5MM)  Result Date: 07/05/2021 CLINICAL DATA:  Head trauma. EXAM: CT HEAD WITHOUT CONTRAST TECHNIQUE: Contiguous axial images were obtained from the base of the skull through the vertex without intravenous contrast. COMPARISON:  10/20/2020 FINDINGS: Brain: Stable age related cerebral atrophy, ventriculomegaly and periventricular white matter disease. No extra-axial fluid collections are identified. No CT findings for acute hemispheric infarction or intracranial hemorrhage.  No mass lesions. The brainstem and cerebellum are normal. Vascular: Stable vascular calcifications but no aneurysm or hyperdense vessels. Skull: No skull fracture or bone lesions. Sinuses/Orbits: The paranasal sinuses and mastoid air cells are clear. The globes are intact. Other: No scalp hematoma. IMPRESSION: 1. Stable age related cerebral atrophy, ventriculomegaly and periventricular white matter disease. 2. No acute intracranial findings or skull fracture. Electronically Signed   By: Marijo Sanes M.D.   On: 07/05/2021 16:08   DG Chest Portable 1 View  Result Date: 07/06/2021 CLINICAL DATA:  Hypoxia. EXAM: PORTABLE CHEST 1 VIEW COMPARISON:  Chest radiograph dated 01/28/2019. FINDINGS: Mild diffuse interstitial prominence may be chronic. Edema or atypical pneumonia is less likely but not excluded. Bibasilar atelectasis. No large pleural effusion. No pneumothorax. Borderline cardiomegaly. Atherosclerotic calcification of the aorta. No acute osseous pathology. IMPRESSION: Mild diffuse interstitial prominence.  No focal consolidation. Electronically Signed   By: Anner Crete M.D.   On: 07/06/2021 00:39    Microbiology: Recent Results (from the past 240 hour(s))  Resp Panel by RT-PCR (Flu A&B, Covid) Nasopharyngeal Swab     Status: None   Collection Time: 07/05/21  3:32 PM   Specimen: Nasopharyngeal Swab; Nasopharyngeal(NP) swabs in vial transport medium  Result Value Ref Range Status   SARS Coronavirus 2 by RT PCR NEGATIVE NEGATIVE Final    Comment: (NOTE) SARS-CoV-2 target nucleic acids are NOT DETECTED.  The SARS-CoV-2 RNA is generally detectable in upper respiratory specimens during the acute phase of infection. The lowest concentration of SARS-CoV-2 viral copies this assay can detect is 138 copies/mL. A negative result does not preclude SARS-Cov-2 infection and should not be used as the sole basis for treatment or other patient management decisions. A negative result may occur with   improper specimen collection/handling, submission of specimen other than nasopharyngeal swab, presence of viral mutation(s) within the areas targeted by this assay, and inadequate number of viral copies(<138 copies/mL). A negative result must be combined with clinical observations, patient history, and epidemiological information. The expected result is Negative.  Fact Sheet for Patients:  EntrepreneurPulse.com.au  Fact Sheet for Healthcare Providers:  IncredibleEmployment.be  This test is no t yet approved or cleared by the [place] FDA and  has been authorized for detection and/or diagnosis of SARS-CoV-2 by FDA under an Emergency Use Authorization (EUA). This EUA will remain  in effect (meaning this test can be used) for the duration of the COVID-19 declaration under Section 564(b)(1) of the Act, 21 U.S.C.section 360bbb-3(b)(1), unless the authorization is terminated  or revoked sooner.       Influenza A by PCR NEGATIVE NEGATIVE Final  Influenza B by PCR NEGATIVE NEGATIVE Final    Comment: (NOTE) The Xpert Xpress SARS-CoV-2/FLU/RSV plus assay is intended as an aid in the diagnosis of influenza from Nasopharyngeal swab specimens and should not be used as a sole basis for treatment. Nasal washings and aspirates are unacceptable for Xpert Xpress SARS-CoV-2/FLU/RSV testing.  Fact Sheet for Patients: EntrepreneurPulse.com.au  Fact Sheet for Healthcare Providers: IncredibleEmployment.be  This test is not yet approved or cleared by the [place] FDA and has been authorized for detection and/or diagnosis of SARS-CoV-2 by FDA under an Emergency Use Authorization (EUA). This EUA will remain in effect (meaning this test can be used) for the duration of the COVID-19 declaration under Section 564(b)(1) of the Act, 21 U.S.C. section 360bbb-3(b)(1), unless the authorization is terminated  or revoked.  Performed at [facility], [address] [place], [place] [address]   Blood culture (routine x 2)     Status: None (Preliminary result)   Collection Time: 07/06/21  1:36 AM   Specimen: BLOOD  Result Value Ref Range Status   Specimen Description BLOOD RIGHT ASSIST CONTROL  Final   Special Requests   Final    BOTTLES DRAWN AEROBIC AND ANAEROBIC Blood Culture adequate volume   Culture   Final    NO GROWTH 2 DAYS Performed at [facility], [address], [place], [place] [address]    Report Status PENDING  Incomplete  Blood culture (routine x 2)     Status: None (Preliminary result)   Collection Time: 07/06/21  1:38 AM   Specimen: BLOOD  Result Value Ref Range Status   Specimen Description BLOOD LEFT FOREARM  Final   Special Requests   Final    BOTTLES DRAWN AEROBIC AND ANAEROBIC Blood Culture adequate volume   Culture   Final    NO GROWTH 2 DAYS Performed at [facility], [address], [place], [place] [address]    Report Status PENDING  Incomplete  MRSA Next Gen by PCR, Nasal     Status: None   Collection Time: 07/06/21  6:45 PM   Specimen: Nasal Mucosa; Nasal Swab  Result Value Ref Range Status   MRSA by PCR Next Gen NOT DETECTED NOT DETECTED Final    Comment: (NOTE) The GeneXpert MRSA Assay (FDA approved for NASAL specimens only), is one component of a comprehensive MRSA colonization surveillance program. It is not intended to diagnose MRSA infection nor to guide or monitor treatment for MRSA infections. Test performance is not FDA approved in patients less than 19 years old. Performed at [facility], [address] [place], [place] [address]   Urine Culture     Status: Abnormal   Collection Time: 07/06/21  8:10 PM   Specimen: In/Out Cath Urine  Result Value Ref Range Status   Specimen Description   Final    IN/OUT CATH URINE Performed at [facility], [address], [place], [place] [address]     Special Requests   Final    NONE Performed at [facility], [address] [place], [place] [address]    Culture MULTIPLE SPECIES PRESENT, SUGGEST RECOLLECTION (A)  Final   Report Status 07/07/2021 FINAL  Final     Labs: Basic Metabolic Panel: Recent Labs  Lab 07/05/21 1702 07/06/21 0531 07/07/21 0602  NA 137 138 141  K 4.5 4.3 4.3  CL 102 104 108  CO2 26 26 26   GLUCOSE 96 92 113*  BUN 35* 32* 23  CREATININE  1.83* 1.62* 1.40*  CALCIUM 9.0 8.7* 8.8*    Liver Function Tests: Recent Labs  Lab 07/05/21 1702 07/07/21 0602  AST 14* 11*  ALT 8 6  ALKPHOS 58 52  BILITOT 0.7 0.6  PROT 6.4* 6.4*  ALBUMIN 3.3* 3.0*    No results for input(s): LIPASE, AMYLASE in the last 168 hours. Recent Labs  Lab 07/06/21 0136  AMMONIA 26    CBC: Recent Labs  Lab 07/05/21 1530 07/06/21 0531 07/07/21 0602  WBC 13.9* 6.7 6.9  NEUTROABS  --   --  4.6  HGB 11.0* 10.1* 9.6*  HCT 35.3* 32.1* 30.8*  MCV 89.8 91.7 90.1  PLT 375 314 317    Cardiac Enzymes: No results for input(s): CKTOTAL, CKMB, CKMBINDEX, TROPONINI in the last 168 hours. BNP: BNP (last 3 results) Recent Labs    07/06/21 0136  BNP 57.7     ProBNP (last 3 results) No results for input(s): PROBNP in the last 8760 hours.  CBG: Recent Labs  Lab 07/06/21 1157 07/06/21 1624 07/06/21 1759 07/06/21 2052 07/07/21 0729  GLUCAP 131* 117* 113* 111* 94        Signed:  Nita Sells MD   [facility] 07/08/2021, 4:07 PM

## 2021-07-10 DIAGNOSIS — Z7151 Drug abuse counseling and surveillance of drug abuser: Secondary | ICD-10-CM | POA: Diagnosis not present

## 2021-07-10 DIAGNOSIS — M6281 Muscle weakness (generalized): Secondary | ICD-10-CM | POA: Diagnosis not present

## 2021-07-10 DIAGNOSIS — R4182 Altered mental status, unspecified: Secondary | ICD-10-CM | POA: Diagnosis not present

## 2021-07-10 DIAGNOSIS — J849 Interstitial pulmonary disease, unspecified: Secondary | ICD-10-CM | POA: Diagnosis not present

## 2021-07-11 DIAGNOSIS — R2681 Unsteadiness on feet: Secondary | ICD-10-CM | POA: Diagnosis not present

## 2021-07-11 DIAGNOSIS — E785 Hyperlipidemia, unspecified: Secondary | ICD-10-CM | POA: Diagnosis not present

## 2021-07-11 DIAGNOSIS — F331 Major depressive disorder, recurrent, moderate: Secondary | ICD-10-CM | POA: Diagnosis not present

## 2021-07-11 DIAGNOSIS — I10 Essential (primary) hypertension: Secondary | ICD-10-CM | POA: Diagnosis not present

## 2021-07-11 DIAGNOSIS — E46 Unspecified protein-calorie malnutrition: Secondary | ICD-10-CM | POA: Diagnosis not present

## 2021-07-11 DIAGNOSIS — Z7151 Drug abuse counseling and surveillance of drug abuser: Secondary | ICD-10-CM | POA: Diagnosis not present

## 2021-07-11 DIAGNOSIS — E118 Type 2 diabetes mellitus with unspecified complications: Secondary | ICD-10-CM | POA: Diagnosis not present

## 2021-07-11 DIAGNOSIS — I739 Peripheral vascular disease, unspecified: Secondary | ICD-10-CM | POA: Diagnosis not present

## 2021-07-11 LAB — CULTURE, BLOOD (ROUTINE X 2)
Culture: NO GROWTH
Culture: NO GROWTH
Special Requests: ADEQUATE
Special Requests: ADEQUATE

## 2021-07-12 DIAGNOSIS — E1140 Type 2 diabetes mellitus with diabetic neuropathy, unspecified: Secondary | ICD-10-CM | POA: Diagnosis not present

## 2021-07-12 DIAGNOSIS — F1194 Opioid use, unspecified with opioid-induced mood disorder: Secondary | ICD-10-CM | POA: Diagnosis not present

## 2021-07-12 DIAGNOSIS — F064 Anxiety disorder due to known physiological condition: Secondary | ICD-10-CM | POA: Diagnosis not present

## 2021-07-19 DIAGNOSIS — F331 Major depressive disorder, recurrent, moderate: Secondary | ICD-10-CM | POA: Diagnosis not present

## 2021-07-19 DIAGNOSIS — F1194 Opioid use, unspecified with opioid-induced mood disorder: Secondary | ICD-10-CM | POA: Diagnosis not present

## 2021-07-19 DIAGNOSIS — F064 Anxiety disorder due to known physiological condition: Secondary | ICD-10-CM | POA: Diagnosis not present

## 2021-07-21 ENCOUNTER — Other Ambulatory Visit: Payer: Self-pay

## 2021-07-21 DIAGNOSIS — R4182 Altered mental status, unspecified: Secondary | ICD-10-CM | POA: Diagnosis not present

## 2021-07-21 DIAGNOSIS — N189 Chronic kidney disease, unspecified: Secondary | ICD-10-CM | POA: Insufficient documentation

## 2021-07-21 DIAGNOSIS — E1122 Type 2 diabetes mellitus with diabetic chronic kidney disease: Secondary | ICD-10-CM | POA: Insufficient documentation

## 2021-07-21 DIAGNOSIS — R9431 Abnormal electrocardiogram [ECG] [EKG]: Secondary | ICD-10-CM | POA: Diagnosis not present

## 2021-07-21 DIAGNOSIS — R0902 Hypoxemia: Secondary | ICD-10-CM | POA: Diagnosis not present

## 2021-07-21 DIAGNOSIS — G319 Degenerative disease of nervous system, unspecified: Secondary | ICD-10-CM | POA: Diagnosis not present

## 2021-07-21 LAB — COMPREHENSIVE METABOLIC PANEL
ALT: 11 U/L (ref 0–44)
AST: 16 U/L (ref 15–41)
Albumin: 3.7 g/dL (ref 3.5–5.0)
Alkaline Phosphatase: 65 U/L (ref 38–126)
Anion gap: 7 (ref 5–15)
BUN: 20 mg/dL (ref 8–23)
CO2: 32 mmol/L (ref 22–32)
Calcium: 9.9 mg/dL (ref 8.9–10.3)
Chloride: 98 mmol/L (ref 98–111)
Creatinine, Ser: 1.14 mg/dL — ABNORMAL HIGH (ref 0.44–1.00)
GFR, Estimated: 51 mL/min — ABNORMAL LOW (ref >60)
Glucose, Bld: 132 mg/dL — ABNORMAL HIGH (ref 70–99)
Potassium: 3.7 mmol/L (ref 3.5–5.1)
Sodium: 137 mmol/L (ref 135–145)
Total Bilirubin: 0.5 mg/dL (ref 0.3–1.2)
Total Protein: 7.1 g/dL (ref 6.5–8.1)

## 2021-07-21 LAB — CBC
HCT: 36.2 % (ref 36.0–46.0)
Hemoglobin: 11.4 g/dL — ABNORMAL LOW (ref 12.0–15.0)
MCH: 28.5 pg (ref 26.0–34.0)
MCHC: 31.5 g/dL (ref 30.0–36.0)
MCV: 90.5 fL (ref 80.0–100.0)
Platelets: 360 10*3/uL (ref 150–400)
RBC: 4 MIL/uL (ref 3.87–5.11)
RDW: 13 % (ref 11.5–15.5)
WBC: 7.7 10*3/uL (ref 4.0–10.5)
nRBC: 0 % (ref 0.0–0.2)

## 2021-07-21 NOTE — ED Notes (Signed)
Patient to waiting room via wheelchair by EMS from local nsg home.  Per EMS staff reported altered mental status and possible overdose (family brings pt heroin at facility).

## 2021-07-21 NOTE — ED Triage Notes (Signed)
Pt presents to ER via ems from [facility].  When asked what brought her to ER, pt states "I couldn't tell you."  Pt A&O x2 at this time.  Pt very slow to respond and answer questions in triage.  Pt denies any pain.

## 2021-07-22 ENCOUNTER — Emergency Department: Payer: Medicare HMO

## 2021-07-22 ENCOUNTER — Emergency Department
Admission: EM | Admit: 2021-07-22 | Discharge: 2021-07-22 | Disposition: A | Discharge status: Home / Self Care | Payer: Medicare HMO | Attending: Emergency Medicine | Admitting: Emergency Medicine

## 2021-07-22 DIAGNOSIS — R531 Weakness: Secondary | ICD-10-CM | POA: Diagnosis not present

## 2021-07-22 DIAGNOSIS — R5381 Other malaise: Secondary | ICD-10-CM | POA: Diagnosis not present

## 2021-07-22 DIAGNOSIS — R4182 Altered mental status, unspecified: Secondary | ICD-10-CM | POA: Diagnosis not present

## 2021-07-22 DIAGNOSIS — Z7401 Bed confinement status: Secondary | ICD-10-CM | POA: Diagnosis not present

## 2021-07-22 DIAGNOSIS — G319 Degenerative disease of nervous system, unspecified: Secondary | ICD-10-CM | POA: Diagnosis not present

## 2021-07-22 LAB — ETHANOL: Alcohol, Ethyl (B): <10 mg/dL (ref <10)
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
## 2021-07-22 NOTE — ED Notes (Signed)
Called [facility] to give nurse to nurse update and to let facility know ed mds wishes to discharge patient back to facility.

## 2021-07-22 NOTE — ED Notes (Signed)
Spoke to lab.  They cannot run ETOH on red top in lab due to being too old.

## 2021-07-22 NOTE — Discharge Instructions (Addendum)
Your tests in the ER were normal.  Please avoid the use of any drugs or medications other than the methadone and other medicines you are prescribed to take, and only take them as prescribed..  Return to the emergency department immediately for new, worsening, or recurrent dizziness, weakness, change in mental status, episodes of passing out, fever, weakness, or any other new or worsening symptoms that concern you.

## 2021-07-25 DIAGNOSIS — R109 Unspecified abdominal pain: Secondary | ICD-10-CM | POA: Diagnosis not present

## 2021-07-25 DIAGNOSIS — K5900 Constipation, unspecified: Secondary | ICD-10-CM | POA: Diagnosis not present

## 2021-07-25 DIAGNOSIS — R400 Somnolence: Secondary | ICD-10-CM | POA: Diagnosis not present

## 2021-07-25 DIAGNOSIS — R4182 Altered mental status, unspecified: Secondary | ICD-10-CM | POA: Diagnosis not present

## 2021-07-25 DIAGNOSIS — R32 Unspecified urinary incontinence: Secondary | ICD-10-CM | POA: Diagnosis not present

## 2021-07-26 DIAGNOSIS — F331 Major depressive disorder, recurrent, moderate: Secondary | ICD-10-CM | POA: Diagnosis not present

## 2021-07-26 DIAGNOSIS — F064 Anxiety disorder due to known physiological condition: Secondary | ICD-10-CM | POA: Diagnosis not present

## 2021-07-26 DIAGNOSIS — F1194 Opioid use, unspecified with opioid-induced mood disorder: Secondary | ICD-10-CM | POA: Diagnosis not present

## 2021-07-26 DIAGNOSIS — R109 Unspecified abdominal pain: Secondary | ICD-10-CM | POA: Diagnosis not present

## 2021-07-26 DIAGNOSIS — R32 Unspecified urinary incontinence: Secondary | ICD-10-CM | POA: Diagnosis not present

## 2021-07-26 DIAGNOSIS — R400 Somnolence: Secondary | ICD-10-CM | POA: Diagnosis not present

## 2021-07-28 ENCOUNTER — Other Ambulatory Visit: Payer: Medicare HMO | Admitting: Primary Care

## 2021-07-28 DIAGNOSIS — R400 Somnolence: Secondary | ICD-10-CM | POA: Diagnosis not present

## 2021-07-28 DIAGNOSIS — I739 Peripheral vascular disease, unspecified: Secondary | ICD-10-CM

## 2021-07-28 DIAGNOSIS — R4182 Altered mental status, unspecified: Secondary | ICD-10-CM | POA: Diagnosis not present

## 2021-07-28 DIAGNOSIS — I10 Essential (primary) hypertension: Secondary | ICD-10-CM | POA: Diagnosis not present

## 2021-07-28 DIAGNOSIS — N184 Chronic kidney disease, stage 4 (severe): Secondary | ICD-10-CM

## 2021-07-28 DIAGNOSIS — K7460 Unspecified cirrhosis of liver: Secondary | ICD-10-CM

## 2021-07-28 DIAGNOSIS — Z515 Encounter for palliative care: Secondary | ICD-10-CM | POA: Diagnosis not present

## 2021-07-28 DIAGNOSIS — R32 Unspecified urinary incontinence: Secondary | ICD-10-CM | POA: Diagnosis not present

## 2021-07-28 DIAGNOSIS — R9431 Abnormal electrocardiogram [ECG] [EKG]: Secondary | ICD-10-CM | POA: Diagnosis not present

## 2021-07-28 NOTE — Progress Notes (Signed)
[facility] Consult Note Telephone: [phone]  Fax: [phone]   Date of encounter: 07/28/21 5:25 PM PATIENT NAME: Yesenia Shepard [address] [place] [address]   [phone] (home)  DOB: 1951-06-07 MRN: [id number] PRIMARY CARE PROVIDER:    [name], [name], MD,  [place] [address] [phone]  REFERRING PROVID[place] [address] [address] [place],  [place] [address] [phone]  RESPONSIBLE PARTY:    Contact Information     Name Relation Home Work Mobile   [place] Sister   [phone]   Bauer Ausborn,Robin Sister   [phone]   Strausbaugh,Bill Relative   [phone]        I met face to face [facility] in [facility]. Palliative Care was asked to follow this patient by consultation request of  Rica Koyanagi, MD to address advance care planning and complex medical decision making. This is the initial visit.                                     ASSESSMENT AND PLAN / RECOMMENDATIONS:   Advance Care Planning/Goals of Care: Goals include to maximize quality of life and symptom management. Patient/health care surrogate Coralyn Mark gave his/her permission to discuss.Our advance care planning conversation included a discussion about:    The value and importance of advance care planning  Experiences with loved ones who have been seriously ill or have died  Exploration of personal, cultural or spiritual beliefs that might influence medical decisions  Exploration of goals of care in the event of a sudden injury or illness  Identification of a healthcare agent - sister Bonnita Hollow of an  advance directive document . Decision to de-escalate disease focused treatments due to poor prognosis. CODE STATUS: DNR  I completed a MOST form today. The patient and family outlined their wishes for the following treatment decisions:  Cardiopulmonary Resuscitation: Do  Not Attempt Resuscitation (DNR/No CPR)  Medical Interventions: Limited Additional Interventions: Use medical treatment, IV fluids and cardiac monitoring as indicated, DO NOT USE intubation or mechanical ventilation. May consider use of less invasive airway support such as BiPAP or CPAP. Also provide comfort measures. Transfer to the hospital if indicated. Avoid intensive care.   Antibiotics: Antibiotics if indicated  IV Fluids: IV fluids for a defined trial period  Feeding Tube: No feeding tube    Symptom Management/Plan:  I was asked to consult on patient case by SNF NP. Patient has been gradually declining over several months but in the past few days has made a precipitous decline. She has had several hospitalizations and has been treated for UTI most recently with IV Zosyn. She has become lethargic and staff endorses behaviors such as reaching, gazing behavior and asking about /talking with people who have passed away. Per facility  NP her labs are not abnormal.   She did not clear an infection with previous antibiotic  and now is on IV Zosyn with out much improvement. We discussed end-of-life process and reached out to her sister. Her sister was in the [place] on vacation but spoke with her several times by phone; we discussed her sister's poor prognosis and possible end of life. She prepared a most form over the phone with two NPs which will be [facility]. I later spoke with sister again and clarified some questions.  She is interested in hospice services if patient does not improve over the weekend. I will follow up with team to make this referral if indicated.    Follow up Palliative Care Visit: Palliative care will continue to follow for complex medical decision making, advance care planning, and clarification of goals. Return 1-2 weeks or prn.  I spent 50 minutes providing this consultation. More than 50% of the time in this consultation was spent in counseling and  care coordination.  PPS: 30%  HOSPICE ELIGIBILITY/DIAGNOSIS: TBD  Chief Complaint: debility, anorexia, immobility  HISTORY OF PRESENT ILLNESS:  Yesenia Shepard is a 71 y.o. year old female  with new onset decline, recent uti x 2, debility, immobility, lethargy. Marland Kitchen   History obtained from review of EMR, discussion with primary team, and interview with family, facility staff/caregiver and/or Ms. Kiddy.  I reviewed available labs, medications, imaging, studies and related documents from the EMR.  Records reviewed and summarized above.   ROS  General: NAD ENMT: endorses dysphagia Cardiovascular: denies chest pain, denies DOE Pulmonary: denies cough, endorses increased SOB Abdomen: endorses  poor  appetite, denies constipation, endorses incontinence of bowel GU: denies dysuria, endorses incontinence of urine MSK:  endorses increased weakness,  no falls reported Skin: denies rashes or wounds Neurological: denies pain, denies insomnia Psych: Endorses flat mood Heme/lymph/immuno: denies bruises, abnormal bleeding  Physical Exam: Current and past weights: 170 lbs Constitutional: NAD General: frail appearing, oral cyanosis EYES: anicteric sclera, lids intact, no discharge  ENMT: intact hearing, oral mucous membranes dry CV: S1S2, RRR,  bounding hr, no LE edema Pulmonary: LCTA, no increased work of breathing, no cough, room air Abdomen: intake <20%,  no ascites GU: deferred MSK: +sarcopenia, moves all extremities,   non ambulatory Skin: warm and dry, no rashes or wounds on visible skin Neuro:  ++ generalized weakness,  ++ cognitive impairment Psych: non-anxious affect, A and O x 1 Hem/lymph/immuno: no widespread bruising CURRENT PROBLEM LIST:  Patient Active Problem List   Diagnosis Date Noted   Atypical pneumonia 07/06/2021   Acquired absence of other left toe(s) ([place]) 10/20/2020   Decubitus ulcer, infected 01/28/2019   Pressure injury of skin 12/24/2018   Sepsis ([place]) 12/22/2018    CKD (chronic kidney disease), stage IV ([place]) 12/22/2018   UTI (urinary tract infection) 12/22/2018   Elevated CK 12/22/2018   Atherosclerotic peripheral vascular disease with ulceration ([place]) 10/24/2018   Atherosclerosis of artery of extremity with ulceration ([place]) 10/24/2018   Atherosclerosis of native arteries of extremity with rest pain ([place]) 10/01/2018   Chronic midline low back pain with bilateral sciatica 04/23/2018   Vitamin D deficiency 04/10/2018   Swelling of limb 03/01/2018   Atherosclerosis of native arteries of extremity with intermittent claudication ([place]) 01/03/2018   Leg pain 12/12/2017   S/P right rotator cuff repair 01/16/2017   Preoperative examination 01/14/2017   Full thickness rotator cuff tear 12/24/2016   Right shoulder pain 09/06/2016   Hepatic cirrhosis ([place]) 02/09/2016   Chronic hepatitis C without hepatic coma ([place]) 11/09/2015   HLD (hyperlipidemia) 08/20/2015   Peripheral vascular disease ([place]) 08/12/2015   Essential hypertension 08/12/2015   Type 2 diabetes mellitus with other specified complication, unspecified whether long term insulin use ([place]) 04/19/2015   Current smoker 03/15/2015   History of recreational drug use 03/15/2015   Osteoarthritis 03/15/2015   Preventative health care 03/15/2015   Ulcer of left lower leg, limited to breakdown of skin ([place]) 03/15/2015   Anxiety and depression 08/25/2012  PAST MEDICAL HISTORY:  Active Ambulatory Problems    Diagnosis Date Noted   Current smoker 03/15/2015   Anxiety and depression 08/25/2012   History of recreational drug use 03/15/2015   Osteoarthritis 03/15/2015   Preventative health care 03/15/2015   Ulcer of left lower leg, limited to breakdown of skin ([place]) 03/15/2015   Type 2 diabetes mellitus with other specified complication, unspecified whether long term insulin use ([place]) 04/19/2015   Peripheral vascular disease ([place]) 08/12/2015   Essential hypertension 08/12/2015   HLD (hyperlipidemia)  08/20/2015   Chronic hepatitis C without hepatic coma ([place]) 11/09/2015   Hepatic cirrhosis ([place]) 02/09/2016   Right shoulder pain 09/06/2016   Preoperative examination 01/14/2017   S/P right rotator cuff repair 01/16/2017   Leg pain 12/12/2017   Atherosclerosis of native arteries of extremity with intermittent claudication ([place]) 01/03/2018   Swelling of limb 03/01/2018   Vitamin D deficiency 04/10/2018   Chronic midline low back pain with bilateral sciatica 04/23/2018   Atherosclerosis of native arteries of extremity with rest pain ([place]) 10/01/2018   Atherosclerotic peripheral vascular disease with ulceration ([place]) 10/24/2018   Atherosclerosis of artery of extremity with ulceration ([place]) 10/24/2018   Sepsis ([place]) 12/22/2018   CKD (chronic kidney disease), stage IV ([place]) 12/22/2018   UTI (urinary tract infection) 12/22/2018   Elevated CK 12/22/2018   Pressure injury of skin 12/24/2018   Decubitus ulcer, infected 01/28/2019   Full thickness rotator cuff tear 12/24/2016   Acquired absence of other left toe(s) ([place]) 10/20/2020   Atypical pneumonia 07/06/2021   Resolved Ambulatory Problems    Diagnosis Date Noted   No Resolved Ambulatory Problems   Past Medical History:  Diagnosis Date   Anxiety    Arthritis    Chronic pain    Depression    Diabetes mellitus without complication ([place])    Drug abuse ([place])    Hepatitis    History of chicken pox    Hypertension    Panic attacks    SOCIAL HX:  Social History   Tobacco Use   Smoking status: Former    Packs/day: 0.50    Years: 30.00    Pack years: 15.00    Types: Cigarettes    Quit date: 07/25/2018    Years since quitting: 3.0   Smokeless tobacco: Never  Substance Use Topics   Alcohol use: No    Alcohol/week: 0.0 standard drinks   FAMILY HX:  Family History  Problem Relation Age of Onset   Arthritis Mother    Mental illness Mother        depression   Diabetes Mother    Cancer Mother        pancreatic   Cancer  Father        colon   Heart disease Father    Diabetes Father    Cancer Daughter        lung      ALLERGIES: No Known Allergies   PERTINENT MEDICATIONS:  Outpatient Encounter Medications as of 07/28/2021  Medication Sig   acetaminophen (TYLENOL) 325 MG tablet Take 2 tablets (650 mg total) by mouth every 6 (six) hours as needed for mild pain (or Fever >/= 101).   ALPRAZolam (XANAX XR) 2 MG 24 hr tablet Take 1 tablet (2 mg total) by mouth daily. Take after breakfast   ARIPiprazole (ABILIFY) 5 MG tablet Take 5 mg by mouth at bedtime.   atorvastatin (LIPITOR) 40 MG tablet Take 1 tablet (40 mg total) by mouth daily.  cholecalciferol (VITAMIN D3) 25 MCG (1000 UT) tablet Take 1 tablet (1,000 Units total) by mouth daily.   clopidogrel (PLAVIX) 75 MG tablet TAKE 1 TABLET BY MOUTH ONCE DAILY   ELIQUIS 5 MG TABS tablet Take 5 mg by mouth 2 (two) times daily.   famotidine (PEPCID) 20 MG tablet Take 1 tablet (20 mg total) by mouth daily.   feeding supplement, GLUCERNA SHAKE, (GLUCERNA SHAKE) LIQD Take 237 mLs by mouth 2 (two) times daily between meals.   ferrous sulfate 325 (65 FE) MG tablet Take 325 mg by mouth 2 (two) times daily.   gabapentin (NEURONTIN) 100 MG capsule Take 1 capsule (100 mg total) by mouth every 12 (twelve) hours.   insulin lispro (HUMALOG) 100 UNIT/ML KwikPen Inject into the skin. Inject subcutaneously before meals per sliding scale. If BS 100-150; 2 units, 201-250; 4 units, 251-300; 6 units, 301-350; 8 units, 351-400; 10 units, 401-450; 12 units. If greater than 450 call MD.   LEVEMIR FLEXTOUCH 100 UNIT/ML FlexTouch Pen Inject 10 Units into the skin daily.   levofloxacin (LEVAQUIN) 500 MG tablet Take 1 tablet (500 mg total) by mouth daily. (Patient not taking: Reported on 07/22/2021)   lisinopril (ZESTRIL) 5 MG tablet Take 5 mg by mouth daily.   Melatonin 5 MG TABS Take 5 mg by mouth at bedtime.   metFORMIN (GLUCOPHAGE) 1000 MG tablet Take 1,000 mg by mouth daily. Take with a  meal.   methadone (DOLOPHINE) 10 MG/5ML solution Take 2.5 mLs (5 mg total) by mouth every 8 (eight) hours as needed for pain. Give 65 ml oral   Polyethylene Glycol 3350 (MIRALAX PO) Take 17 g by mouth daily. Mix in 4 to 8 oz of fluid of choice.   traZODone (DESYREL) 150 MG tablet Take 150 mg by mouth at bedtime.   traZODone (DESYREL) 50 MG tablet Take 0.5 tablets (25 mg total) by mouth at bedtime as needed for sleep.   No facility-administered encounter medications on file as of 07/28/2021.   Thank you for the opportunity to participate in the care of Ms. Puglia.  The palliative care team will continue to follow. Please call our office at [phone] if we can be of additional assistance.   Jason Coop, NP , DNP, AGPCNP-BC  COVID-19 PATIENT SCREENING TOOL Asked and negative response unless otherwise noted:  Have you had symptoms of covid, tested positive or been in contact with someone with symptoms/positive test in the past 5-10 days?

## 2021-07-29 DIAGNOSIS — J810 Acute pulmonary edema: Secondary | ICD-10-CM | POA: Diagnosis not present

## 2021-07-29 DIAGNOSIS — I517 Cardiomegaly: Secondary | ICD-10-CM | POA: Diagnosis not present

## 2021-07-29 DIAGNOSIS — I509 Heart failure, unspecified: Secondary | ICD-10-CM | POA: Diagnosis not present

## 2021-07-29 DIAGNOSIS — R32 Unspecified urinary incontinence: Secondary | ICD-10-CM | POA: Diagnosis not present

## 2021-07-29 DIAGNOSIS — R400 Somnolence: Secondary | ICD-10-CM | POA: Diagnosis not present

## 2021-07-29 DIAGNOSIS — J9600 Acute respiratory failure, unspecified whether with hypoxia or hypercapnia: Secondary | ICD-10-CM | POA: Diagnosis not present

## 2021-08-01 ENCOUNTER — Other Ambulatory Visit: Payer: Medicare HMO | Admitting: Primary Care

## 2021-08-01 ENCOUNTER — Other Ambulatory Visit: Payer: Self-pay

## 2021-08-01 DIAGNOSIS — I739 Peripheral vascular disease, unspecified: Secondary | ICD-10-CM

## 2021-08-01 DIAGNOSIS — E1169 Type 2 diabetes mellitus with other specified complication: Secondary | ICD-10-CM

## 2021-08-01 DIAGNOSIS — N390 Urinary tract infection, site not specified: Secondary | ICD-10-CM

## 2021-08-01 DIAGNOSIS — B182 Chronic viral hepatitis C: Secondary | ICD-10-CM

## 2021-08-01 DIAGNOSIS — Z515 Encounter for palliative care: Secondary | ICD-10-CM

## 2021-08-01 NOTE — Progress Notes (Signed)
[facility] Consult Note Telephone: [phone]  Fax: [phone]    Date of encounter: 08/01/21 12:40 PM PATIENT NAME: Yesenia Shepard [address] [place] [address]   [phone] (home)  DOB: 1950/12/01 MRN: [id number] PRIMARY CARE PROVIDER:    [name], [name], MD,  [place] [address] [phone]  REFERRING PROVIDER:   .Rica Koyanagi, MD [address] [place],  [place] [address] [phone]  RESPONSIBLE PARTY:    Contact Information     Name Relation Home Work Mobile   [place] Sister   [phone]   Bibi Economos,Robin Sister   [phone]   Strausbaugh,Bill Relative   [phone]        I met [facility]. Palliative Care was asked to follow this patient by consultation request of  Rica Koyanagi, MD  to address advance care planning and complex medical decision making. This is a follow up visit.                                   ASSESSMENT AND PLAN / RECOMMENDATIONS:   Advance Care Planning/Goals of Care: Goals include to maximize quality of life and symptom management. Patient/health care surrogate gave his/her permission to discuss.Our advance care planning conversation included a discussion about:     Exploration of personal, cultural or spiritual beliefs that might influence medical decisions  Exploration of goals of care in the event of a sudden injury or illness - continue to treat in place  Identification of a healthcare agent - sister I reached out to her sister who was involved in care planning over the weekend.  We discussed pt improvement, which Coralyn Mark was happy to hear. She'd been on vacation out [place], and is headed back by road. She will visit when she's back to  and is available by phone in the meantime. CODE STATUS: DNR  Symptom Management/Plan:  I met with patient in her nursing home room. She has made great improvement over  the weekend. Her antibiotics are due to finish in the next few days. She is eating and drinking normally again.   Mood: She is having some periodic delusional behavior likely delirium from her illness. She is taking her Abilify 5 mg which was  her baseline, and when discussing Haldol states that she cannot take this due to side effects. I would recommend supportive care without additional pharmacological changes through several weeks of recovery from this infection. Also recommend reorienting/maintaining as much normalcy as possible and hopefully the delirium  will clear on its own. She was calm appropriate and interactive today. Likely she had hypo active delirium from her illness and  this is likely to  clear with supportive care.  Pain: She has returned to her methadone 2.5 dose daily times status post two days. I recommend to maintain this dose,   and such a low-dose would not cause new somnolence,  given her long-standing use.   Follow up Palliative Care Visit: Palliative care will continue to follow for complex medical decision making, advance care planning, and clarification of goals. Return 2 weeks or prn.  This visit was coded based on medical decision making (MDM).  PPS: 40%  HOSPICE ELIGIBILITY/DIAGNOSIS: TBD  Chief Complaint: debility, delirium  HISTORY OF PRESENT ILLNESS:  Yesenia Shepard is a 71 y.o. year old female  with h/oDM, UTI, possible sepsis,  h/o OUD, delirium. Presents today s/p abx course after exhibiting steep decline in mentation and function a week ago. She is much improved, eating again and sitting in chair at bedside. Marland Kitchen   History obtained from review of EMR, discussion with primary team, and interview with family, facility staff/caregiver and/or Ms. Baccam.  I reviewed available labs, medications, imaging, studies and related documents from the EMR.  Records reviewed and summarized above.   ROS   General: NAD ENMT: denies dysphagia Cardiovascular: denies chest pain,  denies DOE Pulmonary: denies cough, denies increased SOB Abdomen: endorses good appetite, denies constipation, endorses incontinence of bowel GU: denies dysuria, endorses incontinence of urine MSK:  endorses increased weakness,  no falls reported Skin: denies rashes or wounds Neurological: denies pain, denies insomnia Psych: Endorses positive mood, confused and agitated at times. Heme/lymph/immuno: denies bruises, abnormal bleeding  Physical Exam: Current and past weights: 170 lbs, stable  Constitutional: NAD General: frail appearing EYES: anicteric sclera, lids intact, no discharge  ENMT: intact hearing, oral mucous membranes moist, dentition intact CV: S1S2, RRR, no LE edema Pulmonary: LCTA, no increased work of breathing, no cough, room air Abdomen: intake 100%, normo-active BS + 4 quadrants, soft and non tender, no ascites GU: deferred MSK: mild sarcopenia, moves all extremities, non  ambulatory, up in w/c Skin: warm and dry, no rashes or wounds on visible skin Neuro:  + generalized weakness,  ++ cognitive impairment Psych: non-anxious affect, A and O x 1-2 Hem/lymph/immuno: no widespread bruising   Thank you for the opportunity to participate in the care of [name].  The palliative care team will continue to follow. Please call our office at [phone] if we can be of additional assistance.   Jason Coop, NP DNP, AGPCNP-BC  COVID-19 PATIENT SCREENING TOOL Asked and negative response unless otherwise noted:   Have you had symptoms of covid, tested positive or been in contact with someone with symptoms/positive test in the past 5-10 days?

## 2021-08-02 DIAGNOSIS — F331 Major depressive disorder, recurrent, moderate: Secondary | ICD-10-CM | POA: Diagnosis not present

## 2021-08-02 DIAGNOSIS — R443 Hallucinations, unspecified: Secondary | ICD-10-CM | POA: Diagnosis not present

## 2021-08-02 DIAGNOSIS — R419 Unspecified symptoms and signs involving cognitive functions and awareness: Secondary | ICD-10-CM | POA: Diagnosis not present

## 2021-08-02 DIAGNOSIS — R4701 Aphasia: Secondary | ICD-10-CM | POA: Diagnosis not present

## 2021-08-08 DIAGNOSIS — F1194 Opioid use, unspecified with opioid-induced mood disorder: Secondary | ICD-10-CM | POA: Diagnosis not present

## 2021-08-08 DIAGNOSIS — F331 Major depressive disorder, recurrent, moderate: Secondary | ICD-10-CM | POA: Diagnosis not present

## 2021-08-08 DIAGNOSIS — B351 Tinea unguium: Secondary | ICD-10-CM | POA: Diagnosis not present

## 2021-08-08 DIAGNOSIS — F064 Anxiety disorder due to known physiological condition: Secondary | ICD-10-CM | POA: Diagnosis not present

## 2021-08-08 DIAGNOSIS — E1140 Type 2 diabetes mellitus with diabetic neuropathy, unspecified: Secondary | ICD-10-CM | POA: Diagnosis not present

## 2021-08-08 DIAGNOSIS — Z794 Long term (current) use of insulin: Secondary | ICD-10-CM | POA: Diagnosis not present

## 2021-08-08 DIAGNOSIS — Z89422 Acquired absence of other left toe(s): Secondary | ICD-10-CM | POA: Diagnosis not present

## 2021-08-09 ENCOUNTER — Other Ambulatory Visit: Payer: Self-pay

## 2021-08-09 ENCOUNTER — Other Ambulatory Visit: Payer: Medicare HMO | Admitting: Primary Care

## 2021-08-09 DIAGNOSIS — Z515 Encounter for palliative care: Secondary | ICD-10-CM

## 2021-08-09 DIAGNOSIS — R5381 Other malaise: Secondary | ICD-10-CM

## 2021-08-09 DIAGNOSIS — N390 Urinary tract infection, site not specified: Secondary | ICD-10-CM | POA: Diagnosis not present

## 2021-08-09 DIAGNOSIS — J189 Pneumonia, unspecified organism: Secondary | ICD-10-CM | POA: Diagnosis not present

## 2021-08-09 NOTE — Progress Notes (Signed)
[facility] Consult Note Telephone: [phone]  Fax: [phone]    Date of encounter: 08/09/21 2:13 PM PATIENT NAME: Yesenia Shepard [address] [place] [address]   [phone] (home)  DOB: 1950-07-16 MRN: [id number] PRIMARY CARE PROVIDER:    [name], [name], MD,  [place] [address] [phone]  REFERRING PROVIDER:   Rica Koyanagi, MD Rica Koyanagi, [place],  [place] [address] [phone]   RESPONSIBLE PARTY:    Contact Information     Name Relation Home Work Mobile   [place] Sister   [phone]   Mayzee Reichenbach,Robin Sister   [phone]   Strausbaugh,Bill Relative   [phone]        I met face to [facility] in [facility]. Palliative Care was asked to follow this patient by consultation request of  Rica Koyanagi, MD  to address advance care planning and complex medical decision making. This is a follow up visit.                                   ASSESSMENT AND PLAN / RECOMMENDATIONS:   Advance Care Planning/Goals of Care: Goals include to maximize quality of life and symptom management. Patient/health care surrogate gave his/her permission to discuss.Our advance care planning conversation included a discussion about:     Exploration of personal, cultural or spiritual beliefs that might influence medical decisions   CODE STATUS: DNR  Symptom Management/Plan:  I  met with patient in the nursing home. She had had grave illness several weeks ago at which time we were discussing hospice admission. However she has recovered completely from this in his back to her baseline.   Her intake is at her previous baseline and her mobility is as well, per wheel chair.  She no longer appears  hospice eligible. I will reach out to family and offer continuing  intermittent palliative support. Patient endorses feeling well eating well and is visiting with  other residents on a regular basis.   Follow up Palliative Care Visit: Palliative care will continue to follow for complex medical decision making, advance care planning, and clarification of goals. Return 6-10 weeks or prn.  This visit was coded based on medical decision making (MDM).  PPS: 40%  HOSPICE ELIGIBILITY/DIAGNOSIS: TBD  Chief Complaint: debility  HISTORY OF PRESENT ILLNESS:  Yesenia Shepard is a 71 y.o. year old female  with recent infection (UTI) possible sepsis, debility and immobility. Pt has improved from several weeks ago during an infection and IV Abx protocol .   History obtained from review of EMR, discussion with primary team, and interview with family, facility staff/caregiver and/or Yesenia Shepard.  I reviewed available labs, medications, imaging, studies and related documents from the EMR.  Records reviewed and summarized above.   ROS  General: NAD EYES: denies vision changes, has glasses ENMT: denies dysphagia Cardiovascular: denies chest pain, denies DOE Pulmonary: denies cough, denies increased SOB, off oxygen Abdomen: endorses good appetite, denies constipation, endorses continence of bowel GU: denies dysuria, endorses continence of urine MSK:  denies  increased weakness,  no falls reported Skin: denies rashes or wounds Neurological: denies pain, denies insomnia Psych: Endorses positive mood Heme/lymph/immuno: denies bruises, abnormal bleeding  Physical Exam: Current and past weights:170 lbs stable Constitutional: NAD General: frail appearing, WNWD EYES: anicteric sclera, lids intact, no discharge  ENMT: intact hearing,  oral mucous membranes moist, dentition intact CV: no LE edema Pulmonary:no increased work of breathing, no cough, room air Abdomen: intake 50-75%, no ascites GU: deferred MSK: + sarcopenia, moves all extremities, non ambulatory, uses w/c Skin: warm and dry, no rashes or wounds on visible skin Neuro:  + generalized weakness,  moderate  cognitive impairment Psych: non-anxious affect, A and O x 2-3 Hem/lymph/immuno: no widespread bruising   Thank you for the opportunity to participate in the care of Yesenia Shepard.  The palliative care team will continue to follow. Please call our office at [phone] if we can be of additional assistance.   Jason Coop, NP DNP, AGPCNP-BC  COVID-19 PATIENT SCREENING TOOL Asked and negative response unless otherwise noted:   Have you had symptoms of covid, tested positive or been in contact with someone with symptoms/positive test in the past 5-10 days?

## 2021-08-11 DIAGNOSIS — R32 Unspecified urinary incontinence: Secondary | ICD-10-CM | POA: Diagnosis not present

## 2021-08-11 DIAGNOSIS — R9341 Abnormal radiologic findings on diagnostic imaging of renal pelvis, ureter, or bladder: Secondary | ICD-10-CM | POA: Diagnosis not present

## 2021-08-11 DIAGNOSIS — N1330 Unspecified hydronephrosis: Secondary | ICD-10-CM | POA: Diagnosis not present

## 2021-08-11 DIAGNOSIS — N390 Urinary tract infection, site not specified: Secondary | ICD-10-CM | POA: Diagnosis not present

## 2021-08-11 DIAGNOSIS — R400 Somnolence: Secondary | ICD-10-CM | POA: Diagnosis not present

## 2021-08-11 DIAGNOSIS — R4182 Altered mental status, unspecified: Secondary | ICD-10-CM | POA: Diagnosis not present

## 2021-08-15 DIAGNOSIS — R32 Unspecified urinary incontinence: Secondary | ICD-10-CM | POA: Diagnosis not present

## 2021-08-15 DIAGNOSIS — W1789XA Other fall from one level to another, initial encounter: Secondary | ICD-10-CM | POA: Diagnosis not present

## 2021-08-16 DIAGNOSIS — F064 Anxiety disorder due to known physiological condition: Secondary | ICD-10-CM | POA: Diagnosis not present

## 2021-08-16 DIAGNOSIS — R419 Unspecified symptoms and signs involving cognitive functions and awareness: Secondary | ICD-10-CM | POA: Diagnosis not present

## 2021-08-16 DIAGNOSIS — R319 Hematuria, unspecified: Secondary | ICD-10-CM | POA: Diagnosis not present

## 2021-08-16 DIAGNOSIS — F0150 Vascular dementia without behavioral disturbance: Secondary | ICD-10-CM | POA: Diagnosis not present

## 2021-08-16 DIAGNOSIS — R32 Unspecified urinary incontinence: Secondary | ICD-10-CM | POA: Diagnosis not present

## 2021-08-16 DIAGNOSIS — F331 Major depressive disorder, recurrent, moderate: Secondary | ICD-10-CM | POA: Diagnosis not present

## 2021-08-16 DIAGNOSIS — G4701 Insomnia due to medical condition: Secondary | ICD-10-CM | POA: Diagnosis not present

## 2021-08-16 DIAGNOSIS — R339 Retention of urine, unspecified: Secondary | ICD-10-CM | POA: Diagnosis not present

## 2021-08-21 DIAGNOSIS — R32 Unspecified urinary incontinence: Secondary | ICD-10-CM | POA: Diagnosis not present

## 2021-08-21 DIAGNOSIS — R319 Hematuria, unspecified: Secondary | ICD-10-CM | POA: Diagnosis not present

## 2021-08-22 DIAGNOSIS — F1194 Opioid use, unspecified with opioid-induced mood disorder: Secondary | ICD-10-CM | POA: Diagnosis not present

## 2021-08-22 DIAGNOSIS — R32 Unspecified urinary incontinence: Secondary | ICD-10-CM | POA: Diagnosis not present

## 2021-08-22 DIAGNOSIS — F331 Major depressive disorder, recurrent, moderate: Secondary | ICD-10-CM | POA: Diagnosis not present

## 2021-08-22 DIAGNOSIS — E43 Unspecified severe protein-calorie malnutrition: Secondary | ICD-10-CM | POA: Diagnosis not present

## 2021-08-22 DIAGNOSIS — R319 Hematuria, unspecified: Secondary | ICD-10-CM | POA: Diagnosis not present

## 2021-08-22 DIAGNOSIS — F064 Anxiety disorder due to known physiological condition: Secondary | ICD-10-CM | POA: Diagnosis not present

## 2021-08-23 DIAGNOSIS — R319 Hematuria, unspecified: Secondary | ICD-10-CM | POA: Diagnosis not present

## 2021-08-23 DIAGNOSIS — R339 Retention of urine, unspecified: Secondary | ICD-10-CM | POA: Diagnosis not present

## 2021-08-23 DIAGNOSIS — R32 Unspecified urinary incontinence: Secondary | ICD-10-CM | POA: Diagnosis not present

## 2021-08-24 DIAGNOSIS — R262 Difficulty in walking, not elsewhere classified: Secondary | ICD-10-CM | POA: Diagnosis not present

## 2021-08-24 DIAGNOSIS — L89120 Pressure ulcer of left upper back, unstageable: Secondary | ICD-10-CM | POA: Diagnosis not present

## 2021-08-24 DIAGNOSIS — L89150 Pressure ulcer of sacral region, unstageable: Secondary | ICD-10-CM | POA: Diagnosis not present

## 2021-08-24 DIAGNOSIS — L89610 Pressure ulcer of right heel, unstageable: Secondary | ICD-10-CM | POA: Diagnosis not present

## 2021-08-24 DIAGNOSIS — R2681 Unsteadiness on feet: Secondary | ICD-10-CM | POA: Diagnosis not present

## 2021-08-24 DIAGNOSIS — R296 Repeated falls: Secondary | ICD-10-CM | POA: Diagnosis not present

## 2021-08-24 DIAGNOSIS — R4182 Altered mental status, unspecified: Secondary | ICD-10-CM | POA: Diagnosis not present

## 2021-08-24 DIAGNOSIS — F0150 Vascular dementia without behavioral disturbance: Secondary | ICD-10-CM | POA: Diagnosis not present

## 2021-08-24 DIAGNOSIS — R1312 Dysphagia, oropharyngeal phase: Secondary | ICD-10-CM | POA: Diagnosis not present

## 2021-08-24 DIAGNOSIS — L89154 Pressure ulcer of sacral region, stage 4: Secondary | ICD-10-CM | POA: Diagnosis not present

## 2021-08-24 DIAGNOSIS — M6281 Muscle weakness (generalized): Secondary | ICD-10-CM | POA: Diagnosis not present

## 2021-08-24 DIAGNOSIS — Z736 Limitation of activities due to disability: Secondary | ICD-10-CM | POA: Diagnosis not present

## 2021-08-25 DIAGNOSIS — R319 Hematuria, unspecified: Secondary | ICD-10-CM | POA: Diagnosis not present

## 2021-08-25 DIAGNOSIS — R32 Unspecified urinary incontinence: Secondary | ICD-10-CM | POA: Diagnosis not present

## 2021-08-25 DIAGNOSIS — N390 Urinary tract infection, site not specified: Secondary | ICD-10-CM | POA: Diagnosis not present

## 2021-08-28 DIAGNOSIS — R32 Unspecified urinary incontinence: Secondary | ICD-10-CM | POA: Diagnosis not present

## 2021-08-28 DIAGNOSIS — R319 Hematuria, unspecified: Secondary | ICD-10-CM | POA: Diagnosis not present

## 2021-08-28 DIAGNOSIS — N390 Urinary tract infection, site not specified: Secondary | ICD-10-CM | POA: Diagnosis not present

## 2021-08-29 DIAGNOSIS — N390 Urinary tract infection, site not specified: Secondary | ICD-10-CM | POA: Diagnosis not present

## 2021-08-29 DIAGNOSIS — R339 Retention of urine, unspecified: Secondary | ICD-10-CM | POA: Diagnosis not present

## 2021-08-29 DIAGNOSIS — R319 Hematuria, unspecified: Secondary | ICD-10-CM | POA: Diagnosis not present

## 2021-08-29 DIAGNOSIS — R109 Unspecified abdominal pain: Secondary | ICD-10-CM | POA: Diagnosis not present

## 2021-08-30 DIAGNOSIS — R4182 Altered mental status, unspecified: Secondary | ICD-10-CM | POA: Diagnosis not present

## 2021-08-30 DIAGNOSIS — L89150 Pressure ulcer of sacral region, unstageable: Secondary | ICD-10-CM | POA: Diagnosis not present

## 2021-08-30 DIAGNOSIS — F0150 Vascular dementia without behavioral disturbance: Secondary | ICD-10-CM | POA: Diagnosis not present

## 2021-08-30 DIAGNOSIS — L89120 Pressure ulcer of left upper back, unstageable: Secondary | ICD-10-CM | POA: Diagnosis not present

## 2021-08-30 DIAGNOSIS — L89610 Pressure ulcer of right heel, unstageable: Secondary | ICD-10-CM | POA: Diagnosis not present

## 2021-08-30 DIAGNOSIS — R109 Unspecified abdominal pain: Secondary | ICD-10-CM | POA: Diagnosis not present

## 2021-08-30 DIAGNOSIS — R2681 Unsteadiness on feet: Secondary | ICD-10-CM | POA: Diagnosis not present

## 2021-08-30 DIAGNOSIS — M6281 Muscle weakness (generalized): Secondary | ICD-10-CM | POA: Diagnosis not present

## 2021-08-30 DIAGNOSIS — N390 Urinary tract infection, site not specified: Secondary | ICD-10-CM | POA: Diagnosis not present

## 2021-08-30 DIAGNOSIS — R296 Repeated falls: Secondary | ICD-10-CM | POA: Diagnosis not present

## 2021-08-30 DIAGNOSIS — R319 Hematuria, unspecified: Secondary | ICD-10-CM | POA: Diagnosis not present

## 2021-08-30 DIAGNOSIS — K5900 Constipation, unspecified: Secondary | ICD-10-CM | POA: Diagnosis not present

## 2021-08-30 DIAGNOSIS — Z736 Limitation of activities due to disability: Secondary | ICD-10-CM | POA: Diagnosis not present

## 2021-08-30 DIAGNOSIS — L89154 Pressure ulcer of sacral region, stage 4: Secondary | ICD-10-CM | POA: Diagnosis not present

## 2021-08-30 DIAGNOSIS — R262 Difficulty in walking, not elsewhere classified: Secondary | ICD-10-CM | POA: Diagnosis not present

## 2021-08-30 DIAGNOSIS — R1312 Dysphagia, oropharyngeal phase: Secondary | ICD-10-CM | POA: Diagnosis not present

## 2021-08-31 ENCOUNTER — Inpatient Hospital Stay
Admission: EM | Admit: 2021-08-31 | Discharge: 2021-09-05 | DRG: 689 | Disposition: A | Discharge status: Skilled Nursing Facility | Payer: Medicare HMO | Source: Skilled Nursing Facility | Attending: Internal Medicine | Admitting: Internal Medicine

## 2021-08-31 ENCOUNTER — Observation Stay: Payer: Self-pay

## 2021-08-31 ENCOUNTER — Emergency Department: Payer: Medicare HMO

## 2021-08-31 DIAGNOSIS — R69 Illness, unspecified: Secondary | ICD-10-CM | POA: Diagnosis not present

## 2021-08-31 DIAGNOSIS — N184 Chronic kidney disease, stage 4 (severe): Secondary | ICD-10-CM | POA: Diagnosis present

## 2021-08-31 DIAGNOSIS — Z818 Family history of other mental and behavioral disorders: Secondary | ICD-10-CM | POA: Diagnosis not present

## 2021-08-31 DIAGNOSIS — Z9049 Acquired absence of other specified parts of digestive tract: Secondary | ICD-10-CM

## 2021-08-31 DIAGNOSIS — Z8249 Family history of ischemic heart disease and other diseases of the circulatory system: Secondary | ICD-10-CM | POA: Diagnosis not present

## 2021-08-31 DIAGNOSIS — E1122 Type 2 diabetes mellitus with diabetic chronic kidney disease: Secondary | ICD-10-CM | POA: Diagnosis present

## 2021-08-31 DIAGNOSIS — R262 Difficulty in walking, not elsewhere classified: Secondary | ICD-10-CM | POA: Diagnosis not present

## 2021-08-31 DIAGNOSIS — E876 Hypokalemia: Secondary | ICD-10-CM | POA: Diagnosis present

## 2021-08-31 DIAGNOSIS — F0150 Vascular dementia without behavioral disturbance: Secondary | ICD-10-CM | POA: Diagnosis not present

## 2021-08-31 DIAGNOSIS — L89120 Pressure ulcer of left upper back, unstageable: Secondary | ICD-10-CM | POA: Diagnosis not present

## 2021-08-31 DIAGNOSIS — Z20822 Contact with and (suspected) exposure to covid-19: Secondary | ICD-10-CM | POA: Diagnosis present

## 2021-08-31 DIAGNOSIS — Z833 Family history of diabetes mellitus: Secondary | ICD-10-CM | POA: Diagnosis not present

## 2021-08-31 DIAGNOSIS — I739 Peripheral vascular disease, unspecified: Secondary | ICD-10-CM | POA: Diagnosis not present

## 2021-08-31 DIAGNOSIS — R109 Unspecified abdominal pain: Secondary | ICD-10-CM | POA: Diagnosis not present

## 2021-08-31 DIAGNOSIS — N390 Urinary tract infection, site not specified: Secondary | ICD-10-CM | POA: Diagnosis present

## 2021-08-31 DIAGNOSIS — E785 Hyperlipidemia, unspecified: Secondary | ICD-10-CM | POA: Diagnosis present

## 2021-08-31 DIAGNOSIS — Z66 Do not resuscitate: Secondary | ICD-10-CM | POA: Diagnosis present

## 2021-08-31 DIAGNOSIS — L89150 Pressure ulcer of sacral region, unstageable: Secondary | ICD-10-CM | POA: Diagnosis not present

## 2021-08-31 DIAGNOSIS — F32A Depression, unspecified: Secondary | ICD-10-CM | POA: Diagnosis present

## 2021-08-31 DIAGNOSIS — R413 Other amnesia: Secondary | ICD-10-CM | POA: Diagnosis not present

## 2021-08-31 DIAGNOSIS — N136 Pyonephrosis: Secondary | ICD-10-CM | POA: Diagnosis present

## 2021-08-31 DIAGNOSIS — R1312 Dysphagia, oropharyngeal phase: Secondary | ICD-10-CM | POA: Diagnosis not present

## 2021-08-31 DIAGNOSIS — Z87440 Personal history of urinary (tract) infections: Secondary | ICD-10-CM

## 2021-08-31 DIAGNOSIS — E1151 Type 2 diabetes mellitus with diabetic peripheral angiopathy without gangrene: Secondary | ICD-10-CM | POA: Diagnosis present

## 2021-08-31 DIAGNOSIS — Z8261 Family history of arthritis: Secondary | ICD-10-CM | POA: Diagnosis not present

## 2021-08-31 DIAGNOSIS — G894 Chronic pain syndrome: Secondary | ICD-10-CM | POA: Diagnosis present

## 2021-08-31 DIAGNOSIS — I129 Hypertensive chronic kidney disease with stage 1 through stage 4 chronic kidney disease, or unspecified chronic kidney disease: Secondary | ICD-10-CM | POA: Diagnosis present

## 2021-08-31 DIAGNOSIS — R4182 Altered mental status, unspecified: Secondary | ICD-10-CM | POA: Diagnosis present

## 2021-08-31 DIAGNOSIS — I10 Essential (primary) hypertension: Secondary | ICD-10-CM | POA: Diagnosis present

## 2021-08-31 DIAGNOSIS — K7460 Unspecified cirrhosis of liver: Secondary | ICD-10-CM | POA: Diagnosis present

## 2021-08-31 DIAGNOSIS — F410 Panic disorder [episodic paroxysmal anxiety] without agoraphobia: Secondary | ICD-10-CM | POA: Diagnosis present

## 2021-08-31 DIAGNOSIS — M1990 Unspecified osteoarthritis, unspecified site: Secondary | ICD-10-CM | POA: Diagnosis present

## 2021-08-31 DIAGNOSIS — I700 Atherosclerosis of aorta: Secondary | ICD-10-CM | POA: Diagnosis not present

## 2021-08-31 DIAGNOSIS — Z87891 Personal history of nicotine dependence: Secondary | ICD-10-CM | POA: Diagnosis not present

## 2021-08-31 DIAGNOSIS — Z7984 Long term (current) use of oral hypoglycemic drugs: Secondary | ICD-10-CM

## 2021-08-31 DIAGNOSIS — Z90710 Acquired absence of both cervix and uterus: Secondary | ICD-10-CM

## 2021-08-31 DIAGNOSIS — L89616 Pressure-induced deep tissue damage of right heel: Secondary | ICD-10-CM | POA: Diagnosis present

## 2021-08-31 DIAGNOSIS — G9341 Metabolic encephalopathy: Secondary | ICD-10-CM | POA: Diagnosis present

## 2021-08-31 DIAGNOSIS — K529 Noninfective gastroenteritis and colitis, unspecified: Secondary | ICD-10-CM | POA: Diagnosis present

## 2021-08-31 DIAGNOSIS — R2681 Unsteadiness on feet: Secondary | ICD-10-CM | POA: Diagnosis not present

## 2021-08-31 DIAGNOSIS — Z79891 Long term (current) use of opiate analgesic: Secondary | ICD-10-CM

## 2021-08-31 DIAGNOSIS — B961 Klebsiella pneumoniae [K. pneumoniae] as the cause of diseases classified elsewhere: Secondary | ICD-10-CM | POA: Diagnosis present

## 2021-08-31 DIAGNOSIS — Z794 Long term (current) use of insulin: Secondary | ICD-10-CM

## 2021-08-31 DIAGNOSIS — Z743 Need for continuous supervision: Secondary | ICD-10-CM | POA: Diagnosis not present

## 2021-08-31 DIAGNOSIS — Z7901 Long term (current) use of anticoagulants: Secondary | ICD-10-CM

## 2021-08-31 DIAGNOSIS — R339 Retention of urine, unspecified: Secondary | ICD-10-CM | POA: Diagnosis not present

## 2021-08-31 DIAGNOSIS — R0602 Shortness of breath: Secondary | ICD-10-CM | POA: Diagnosis not present

## 2021-08-31 DIAGNOSIS — N1330 Unspecified hydronephrosis: Secondary | ICD-10-CM | POA: Diagnosis not present

## 2021-08-31 DIAGNOSIS — Z736 Limitation of activities due to disability: Secondary | ICD-10-CM | POA: Diagnosis not present

## 2021-08-31 DIAGNOSIS — K219 Gastro-esophageal reflux disease without esophagitis: Secondary | ICD-10-CM | POA: Diagnosis present

## 2021-08-31 DIAGNOSIS — Z89422 Acquired absence of other left toe(s): Secondary | ICD-10-CM

## 2021-08-31 DIAGNOSIS — I82409 Acute embolism and thrombosis of unspecified deep veins of unspecified lower extremity: Secondary | ICD-10-CM | POA: Diagnosis present

## 2021-08-31 DIAGNOSIS — G8929 Other chronic pain: Secondary | ICD-10-CM | POA: Diagnosis present

## 2021-08-31 DIAGNOSIS — Z79899 Other long term (current) drug therapy: Secondary | ICD-10-CM

## 2021-08-31 DIAGNOSIS — R1310 Dysphagia, unspecified: Secondary | ICD-10-CM | POA: Diagnosis not present

## 2021-08-31 DIAGNOSIS — E1129 Type 2 diabetes mellitus with other diabetic kidney complication: Secondary | ICD-10-CM | POA: Diagnosis present

## 2021-08-31 DIAGNOSIS — Z8619 Personal history of other infectious and parasitic diseases: Secondary | ICD-10-CM

## 2021-08-31 DIAGNOSIS — Z86718 Personal history of other venous thrombosis and embolism: Secondary | ICD-10-CM | POA: Diagnosis not present

## 2021-08-31 DIAGNOSIS — F419 Anxiety disorder, unspecified: Secondary | ICD-10-CM | POA: Diagnosis not present

## 2021-08-31 DIAGNOSIS — Z7902 Long term (current) use of antithrombotics/antiplatelets: Secondary | ICD-10-CM

## 2021-08-31 DIAGNOSIS — M6281 Muscle weakness (generalized): Secondary | ICD-10-CM | POA: Diagnosis not present

## 2021-08-31 DIAGNOSIS — G319 Degenerative disease of nervous system, unspecified: Secondary | ICD-10-CM | POA: Diagnosis not present

## 2021-08-31 LAB — URINALYSIS, COMPLETE (UACMP) WITH MICROSCOPIC
Bilirubin Urine: NEGATIVE
Glucose, UA: NEGATIVE mg/dL
Ketones, ur: 20 mg/dL — AB
Nitrite: POSITIVE — AB
Protein, ur: 30 mg/dL — AB
Specific Gravity, Urine: 1.02 (ref 1.005–1.030)
WBC, UA: >50 WBC/hpf — ABNORMAL HIGH (ref 0–5)
pH: 5 (ref 5.0–8.0)

## 2021-08-31 LAB — COMPREHENSIVE METABOLIC PANEL
ALT: 12 U/L (ref 0–44)
AST: 17 U/L (ref 15–41)
Albumin: 3.7 g/dL (ref 3.5–5.0)
Alkaline Phosphatase: 73 U/L (ref 38–126)
Anion gap: 9 (ref 5–15)
BUN: 18 mg/dL (ref 8–23)
CO2: 28 mmol/L (ref 22–32)
Calcium: 8.9 mg/dL (ref 8.9–10.3)
Chloride: 100 mmol/L (ref 98–111)
Creatinine, Ser: 1.19 mg/dL — ABNORMAL HIGH (ref 0.44–1.00)
GFR, Estimated: 49 mL/min — ABNORMAL LOW (ref >60)
Glucose, Bld: 132 mg/dL — ABNORMAL HIGH (ref 70–99)
Potassium: 3.7 mmol/L (ref 3.5–5.1)
Sodium: 137 mmol/L (ref 135–145)
Total Bilirubin: 0.7 mg/dL (ref 0.3–1.2)
Total Protein: 7.5 g/dL (ref 6.5–8.1)

## 2021-08-31 LAB — CBC WITH DIFFERENTIAL/PLATELET
Abs Immature Granulocytes: 0.02 10*3/uL (ref 0.00–0.07)
Basophils Absolute: 0 10*3/uL (ref 0.0–0.1)
Basophils Relative: 0 %
Eosinophils Absolute: 0 10*3/uL (ref 0.0–0.5)
Eosinophils Relative: 0 %
HCT: 39.9 % (ref 36.0–46.0)
Hemoglobin: 12.6 g/dL (ref 12.0–15.0)
Immature Granulocytes: 0 %
Lymphocytes Relative: 20 %
Lymphs Abs: 1.8 10*3/uL (ref 0.7–4.0)
MCH: 28.1 pg (ref 26.0–34.0)
MCHC: 31.6 g/dL (ref 30.0–36.0)
MCV: 89.1 fL (ref 80.0–100.0)
Monocytes Absolute: 0.5 10*3/uL (ref 0.1–1.0)
Monocytes Relative: 5 %
Neutro Abs: 6.8 10*3/uL (ref 1.7–7.7)
Neutrophils Relative %: 75 %
Platelets: 502 10*3/uL — ABNORMAL HIGH (ref 150–400)
RBC: 4.48 MIL/uL (ref 3.87–5.11)
RDW: 13.8 % (ref 11.5–15.5)
WBC: 9.1 10*3/uL (ref 4.0–10.5)
nRBC: 0 % (ref 0.0–0.2)

## 2021-08-31 LAB — LIPASE, BLOOD: Lipase: 25 U/L (ref 11–51)

## 2021-08-31 LAB — APTT: aPTT: 39 seconds — ABNORMAL HIGH (ref 24–36)

## 2021-08-31 LAB — CBG MONITORING, ED
Glucose-Capillary: 115 mg/dL — ABNORMAL HIGH (ref 70–99)
Glucose-Capillary: 116 mg/dL — ABNORMAL HIGH (ref 70–99)
Glucose-Capillary: 69 mg/dL — ABNORMAL LOW (ref 70–99)

## 2021-08-31 LAB — PROTIME-INR
INR: 1.2 (ref 0.8–1.2)
Prothrombin Time: 15.1 seconds (ref 11.4–15.2)

## 2021-08-31 LAB — RESP PANEL BY RT-PCR (FLU A&B, COVID) ARPGX2
Influenza A by PCR: NEGATIVE
Influenza B by PCR: NEGATIVE
SARS Coronavirus 2 by RT PCR: NEGATIVE

## 2021-08-31 MED ORDER — VITAMIN D 25 MCG (1000 UNIT) PO TABS
1000.0000 [IU] | ORAL_TABLET | Freq: Every day | ORAL | Status: DC
  Administered 2021-09-02 – 2021-09-05 (×4): 1000 [IU] via ORAL
  Filled 2021-08-31 (×4): qty 1

## 2021-08-31 MED ORDER — INSULIN ASPART 100 UNIT/ML IJ SOLN
0.0000 [IU] | Freq: Three times a day (TID) | INTRAMUSCULAR | Status: DC
  Administered 2021-09-01 – 2021-09-02 (×2): 1 [IU] via SUBCUTANEOUS
  Administered 2021-09-03: 13:00:00 2 [IU] via SUBCUTANEOUS
  Administered 2021-09-03: 18:00:00 1 [IU] via SUBCUTANEOUS
  Administered 2021-09-04: 2 [IU] via SUBCUTANEOUS
  Administered 2021-09-05: 1 [IU] via SUBCUTANEOUS
  Filled 2021-08-31 (×5): qty 1

## 2021-08-31 MED ORDER — CHLORHEXIDINE GLUCONATE CLOTH 2 % EX PADS
6.0000 | MEDICATED_PAD | Freq: Every day | CUTANEOUS | Status: DC
  Administered 2021-09-01 – 2021-09-05 (×5): 6 via TOPICAL
  Filled 2021-08-31: qty 6

## 2021-08-31 MED ORDER — PIPERACILLIN-TAZOBACTAM 3.375 G IVPB
3.3750 g | Freq: Three times a day (TID) | INTRAVENOUS | Status: DC
  Administered 2021-09-01 – 2021-09-04 (×10): 3.375 g via INTRAVENOUS
  Filled 2021-08-31 (×10): qty 50

## 2021-08-31 MED ORDER — ONDANSETRON HCL 4 MG/2ML IJ SOLN
4.0000 mg | Freq: Three times a day (TID) | INTRAMUSCULAR | Status: DC | PRN
Start: 2021-08-31 — End: 2021-09-05

## 2021-08-31 MED ORDER — ARIPIPRAZOLE 10 MG PO TABS
10.0000 mg | ORAL_TABLET | Freq: Every day | ORAL | Status: DC
  Administered 2021-09-02 – 2021-09-04 (×3): 10 mg via ORAL
  Filled 2021-08-31 (×6): qty 1

## 2021-08-31 MED ORDER — SODIUM CHLORIDE 0.9 % IV BOLUS
1000.0000 mL | Freq: Once | INTRAVENOUS | Status: AC
  Administered 2021-08-31: 1000 mL via INTRAVENOUS

## 2021-08-31 MED ORDER — ATORVASTATIN CALCIUM 20 MG PO TABS
40.0000 mg | ORAL_TABLET | Freq: Every evening | ORAL | Status: DC
  Administered 2021-09-02 – 2021-09-04 (×3): 40 mg via ORAL
  Filled 2021-08-31 (×4): qty 2

## 2021-08-31 MED ORDER — INSULIN GLARGINE-YFGN 100 UNIT/ML ~~LOC~~ SOLN
7.0000 [IU] | Freq: Every day | SUBCUTANEOUS | Status: DC
  Administered 2021-09-01 – 2021-09-04 (×4): 7 [IU] via SUBCUTANEOUS
  Filled 2021-08-31 (×6): qty 0.07

## 2021-08-31 MED ORDER — TAMSULOSIN HCL 0.4 MG PO CAPS
0.4000 mg | ORAL_CAPSULE | Freq: Every morning | ORAL | Status: DC
  Administered 2021-09-03 – 2021-09-05 (×3): 0.4 mg via ORAL
  Filled 2021-08-31 (×2): qty 1

## 2021-08-31 MED ORDER — POLYETHYLENE GLYCOL 3350 17 G PO PACK
17.0000 g | PACK | Freq: Every day | ORAL | Status: DC | PRN
  Administered 2021-09-05: 09:00:00 17 g via ORAL
  Filled 2021-08-31: qty 1

## 2021-08-31 MED ORDER — INSULIN ASPART 100 UNIT/ML IJ SOLN
0.0000 [IU] | Freq: Every day | INTRAMUSCULAR | Status: DC
  Administered 2021-09-04: 23:00:00 2 [IU] via SUBCUTANEOUS

## 2021-08-31 MED ORDER — METHADONE HCL 10 MG/ML PO CONC
25.0000 mg | Freq: Every morning | ORAL | Status: DC
  Filled 2021-08-31 (×3): qty 5

## 2021-08-31 MED ORDER — IOHEXOL 300 MG/ML  SOLN
100.0000 mL | Freq: Once | INTRAMUSCULAR | Status: AC | PRN
  Administered 2021-08-31: 100 mL via INTRAVENOUS

## 2021-08-31 MED ORDER — SODIUM CHLORIDE 0.9 % IV SOLN: 2.0000 g | INTRAVENOUS | Status: DC

## 2021-08-31 MED ORDER — VENLAFAXINE HCL ER 37.5 MG PO CP24
37.5000 mg | ORAL_CAPSULE | Freq: Every day | ORAL | Status: DC
  Administered 2021-09-03 – 2021-09-05 (×3): 37.5 mg via ORAL
  Filled 2021-08-31 (×5): qty 1

## 2021-08-31 MED ORDER — ACETAMINOPHEN 325 MG PO TABS: 650.0000 mg | ORAL_TABLET | Freq: Four times a day (QID) | ORAL | Status: DC | PRN

## 2021-08-31 MED ORDER — METRONIDAZOLE 500 MG/100ML IV SOLN
500.0000 mg | Freq: Once | INTRAVENOUS | Status: DC
Start: 2021-08-31 — End: 2021-08-31

## 2021-08-31 MED ORDER — SODIUM CHLORIDE 0.9 % IV SOLN: 1.0000 g | Freq: Once | INTRAVENOUS | Status: DC

## 2021-08-31 MED ORDER — SODIUM CHLORIDE 0.9% FLUSH: 10.0000 mL | INTRAVENOUS | Status: DC | PRN

## 2021-08-31 MED ORDER — FAMOTIDINE 20 MG PO TABS
20.0000 mg | ORAL_TABLET | Freq: Every day | ORAL | Status: DC
  Administered 2021-09-02 – 2021-09-05 (×4): 20 mg via ORAL
  Filled 2021-08-31 (×5): qty 1

## 2021-08-31 MED ORDER — GABAPENTIN 100 MG PO CAPS
100.0000 mg | ORAL_CAPSULE | Freq: Two times a day (BID) | ORAL | Status: DC
  Administered 2021-09-02 – 2021-09-05 (×7): 100 mg via ORAL
  Filled 2021-08-31 (×8): qty 1

## 2021-08-31 MED ORDER — APIXABAN 5 MG PO TABS
5.0000 mg | ORAL_TABLET | Freq: Two times a day (BID) | ORAL | Status: DC
  Administered 2021-09-02 – 2021-09-05 (×7): 5 mg via ORAL
  Filled 2021-08-31 (×8): qty 1

## 2021-08-31 MED ORDER — SODIUM CHLORIDE 0.9 % IV SOLN: INTRAVENOUS | Status: DC

## 2021-08-31 MED ORDER — LISINOPRIL 10 MG PO TABS
10.0000 mg | ORAL_TABLET | Freq: Every day | ORAL | Status: DC
Start: 2021-08-31 — End: 2021-09-05
  Administered 2021-09-02 – 2021-09-05 (×4): 10 mg via ORAL
  Filled 2021-08-31 (×5): qty 1

## 2021-08-31 MED ORDER — ENOXAPARIN SODIUM 40 MG/0.4ML IJ SOSY: 40.0000 mg | PREFILLED_SYRINGE | INTRAMUSCULAR | Status: DC

## 2021-08-31 MED ORDER — SODIUM CHLORIDE 0.9% FLUSH
10.0000 mL | Freq: Two times a day (BID) | INTRAVENOUS | Status: DC
  Administered 2021-09-01 – 2021-09-05 (×8): 10 mL

## 2021-08-31 MED ORDER — FERROUS SULFATE 325 (65 FE) MG PO TABS
325.0000 mg | ORAL_TABLET | Freq: Two times a day (BID) | ORAL | Status: DC
Start: 2021-08-31 — End: 2021-09-05
  Administered 2021-09-02 – 2021-09-05 (×7): 325 mg via ORAL
  Filled 2021-08-31 (×8): qty 1

## 2021-08-31 MED ORDER — SENNOSIDES-DOCUSATE SODIUM 8.6-50 MG PO TABS
1.0000 | ORAL_TABLET | Freq: Every morning | ORAL | Status: DC
  Administered 2021-09-03 – 2021-09-05 (×3): 1 via ORAL
  Filled 2021-08-31 (×3): qty 1

## 2021-08-31 MED ORDER — INSULIN ASPART 100 UNIT/ML IJ SOLN: 0.0000 [IU] | Freq: Three times a day (TID) | INTRAMUSCULAR | Status: DC

## 2021-08-31 NOTE — ED Notes (Signed)
IV team at bedside 

## 2021-08-31 NOTE — ED Provider Notes (Signed)
[facility] Provider Note    Event Date/Time   First MD Initiated Contact with Patient 08/31/21 1152     (approximate)   History   Altered Mental Status (Abd pain , altered, baseline unsure. )   HPI  Yesenia Shepard is a 71 y.o. female with past medical history of chronic pain on methadone, diabetes, hypertension, referral vascular disease, chronic decubitus ulcer who presents with altered mental status.  She comes today from [facility].  Per EMS she is altered however unclear what her baseline is.  Patient is currently being treated for UTI.  Patient Dors is feeling generally unwell and does complain of abdominal pain.  She denies nausea vomiting.  Does state she has not been eating as well.  Denies shortness of breath or chest pain.    Past Medical History:  Diagnosis Date   Anxiety    Arthritis    Chronic pain    Depression    Diabetes mellitus without complication ([place])    Drug abuse ([place])    History of polysubstance abuse; currently on methadone.   Hepatitis    History of Hep "C". treated and cured with Harvoni   History of chicken pox    Hypertension    Panic attacks    Peripheral vascular disease ([place])    stent in place.    UTI (urinary tract infection)    History of    Patient Active Problem List   Diagnosis Date Noted   Atypical pneumonia 07/06/2021   Acquired absence of other left toe(s) ([place]) 10/20/2020   Decubitus ulcer, infected 01/28/2019   Pressure injury of skin 12/24/2018   Sepsis ([place]) 12/22/2018   CKD (chronic kidney disease), stage IV ([place]) 12/22/2018   UTI (urinary tract infection) 12/22/2018   Elevated CK 12/22/2018   Atherosclerotic peripheral vascular disease with ulceration ([place]) 10/24/2018   Atherosclerosis of artery of extremity with ulceration ([place]) 10/24/2018   Atherosclerosis of native arteries of extremity with rest pain ([place]) 10/01/2018   Chronic midline low back pain with bilateral sciatica 04/23/2018    Vitamin D deficiency 04/10/2018   Swelling of limb 03/01/2018   Atherosclerosis of native arteries of extremity with intermittent claudication ([place]) 01/03/2018   Leg pain 12/12/2017   S/P right rotator cuff repair 01/16/2017   Preoperative examination 01/14/2017   Full thickness rotator cuff tear 12/24/2016   Right shoulder pain 09/06/2016   Hepatic cirrhosis ([place]) 02/09/2016   Chronic hepatitis C without hepatic coma ([place]) 11/09/2015   HLD (hyperlipidemia) 08/20/2015   Peripheral vascular disease ([place]) 08/12/2015   Essential hypertension 08/12/2015   Type 2 diabetes mellitus with other specified complication, unspecified whether long term insulin use ([place]) 04/19/2015   Current smoker 03/15/2015   History of recreational drug use 03/15/2015   Osteoarthritis 03/15/2015   Preventative health care 03/15/2015   Ulcer of left lower leg, limited to breakdown of skin ([place]) 03/15/2015   Anxiety and depression 08/25/2012     Physical Exam  Triage Vital Signs: ED Triage Vitals  Enc Vitals Group     BP 08/31/21 1157 (!) 148/84     Pulse Rate 08/31/21 1155 85     Resp 08/31/21 1155 17     Temp 08/31/21 1155 98.5 F (36.9 C)     Temp Source 08/31/21 1155 Oral     SpO2 08/31/21 1155 99 %     Weight 08/31/21 1156 180 lb 12.4 oz (82 kg)     Height 08/31/21 1156  5\' 7"  (1.702 m)     Head Circumference --      [facility] --      Pain Score --      Pain Loc --      Pain Edu? --      Excl. in [place]? --     Most recent vital signs: Vitals:   08/31/21 1155 08/31/21 1157  BP:  (!) 148/84  Pulse: 85   Resp: 17   Temp: 98.5 F (36.9 C)   SpO2: 99%      General: Awake, no distress.  Chronically ill-appearing CV:  Good peripheral perfusion.  No peripheral edema Resp:  Normal effort.  No respiratory distress Abd:  No distention.  Tenderness in the suprapubic and lower quadrants, diminished soft Neuro:             Patient is awake, oriented to person only, moves all extremities  symmetrically Other:  Has flat affect   ED Results / Procedures / Treatments  Labs (all labs ordered are listed, but only abnormal results are displayed) Labs Reviewed  COMPREHENSIVE METABOLIC PANEL - Abnormal; Notable for the following components:      Result Value   Glucose, Bld 132 (*)    Creatinine, Ser 1.19 (*)    GFR, Estimated 49 (*)    All other components within normal limits  CBC WITH DIFFERENTIAL/PLATELET - Abnormal; Notable for the following components:   Platelets 502 (*)    All other components within normal limits  URINALYSIS, COMPLETE (UACMP) WITH MICROSCOPIC - Abnormal; Notable for the following components:   Color, Urine YELLOW (*)    APPearance HAZY (*)    Hgb urine dipstick MODERATE (*)    Ketones, ur 20 (*)    Protein, ur 30 (*)    Nitrite POSITIVE (*)    Leukocytes,Ua LARGE (*)    WBC, UA >50 (*)    Bacteria, UA MANY (*)    All other components within normal limits  URINE CULTURE  URINE CULTURE  RESP PANEL BY RT-PCR (FLU A&B, COVID) ARPGX2  LIPASE, BLOOD  URINALYSIS, COMPLETE (UACMP) WITH MICROSCOPIC     EKG  EKG interpretation performed by myself: NSR, nml axis, nml intervals, no acute ischemic changes    RADIOLOGY Reviewed the CT abdomen pelvis, reviewed radiology read which is notable for proctocolitis   PROCEDURES:  Critical Care performed: No  .1-3 Lead EKG Interpretation Performed by: Rada Hay, MD Authorized by: Rada Hay, MD     Interpretation: normal     ECG rate assessment: normal     Rhythm: sinus rhythm     Ectopy: none     Conduction: normal    The patient is on the cardiac monitor to evaluate for evidence of arrhythmia and/or significant heart rate changes.   MEDICATIONS ORDERED IN ED: Medications  cefTRIAXone (ROCEPHIN) 1 g in sodium chloride 0.9 % 100 mL IVPB (has no administration in time range)  metroNIDAZOLE (FLAGYL) IVPB 500 mg (has no administration in time range)  sodium chloride 0.9 %  bolus 1,000 mL (1,000 mLs Intravenous New Bag/Given 08/31/21 1221)  iohexol (OMNIPAQUE) 300 MG/ML solution 100 mL (100 mLs Intravenous Contrast Given 08/31/21 1303)     IMPRESSION / MDM / ASSESSMENT AND PLAN / ED COURSE  I reviewed the triage vital signs and the nursing notes.  Differential diagnosis includes, but is not limited to, infection, sepsis, medication side effect, intra-abdominal infection, appendicitis, UTI, ICH  Is a 71 year old female presenting [facility] with altered mental status.  Patient is oriented to person only but she is awake.  She has a flat affect.  Does complain of abdominal pain which is her only complaint today.  Abdomen is soft but she does have some tenderness in the lower quadrants.  She is not in any respiratory distress, she does have a Foley catheter in place draining yellow urine is apparently being [facility]  Her exam is nonfocal.  Differential is broad.  Will obtain a CT of the abdomen pelvis to rule out acute surgical pathology in her abdomen, CT head and labs.  Her vital signs are reassuring, no hypotension fever sats are 99% on room air.   Since labs are overall reassuring.  She has no significant electrolyte abnormalities no leukocytosis or anemia.  Base is normal.  CT abdomen pelvis is read as radiology as proctocolitis.  Her UA was initially sent from the tubing of her Foley catheter.  Does have significant WBCs and many bacteria nitrate positive.  I have asked nursing to replace the Foley and obtain a new specimen for more accurate culture.  Last with patient's nurse at [facility], gust I did speak with the patient's nurse at [facility] who notes that over the last 3 days she has been significantly off her baseline.  Normally she is alert and oriented x3, over the last days she has not been oriented and just repeating questions that are asked to her.  Patient's nurse tells me she has been  treating with linezolid p.o. 600 every 12 for a UTI and she does confirm that this was based on a urine culture and is day 3 today of antibiotics.  Somewhat unusual for her to have MRSA in her urine I wonder if this is a contaminant.  Given the finding of proctocolitis will treat with ceftriaxone and Flagyl which would also cover for likely E. coli.  Given she is not at her baseline will admit to the hospitalist service.  Discussed with hospitalist for admission. Clinical Course as of 08/31/21 1505  Thu Aug 31, 2021  1414 Linezolid 600 q12 [KM]  1414 Day 3 today [KM]    Clinical Course User Index [KM] Rada Hay, MD     FINAL CLINICAL IMPRESSION(S) / ED DIAGNOSES   Final diagnoses:  Altered mental status, unspecified altered mental status type     Rx / DC Orders   ED Discharge Orders     None        Note:  This document was prepared using Dragon voice recognition software and may include unintentional dictation errors.   Rada Hay, MD 08/31/21 [phone]

## 2021-08-31 NOTE — ED Notes (Signed)
Pt was given orange juice and pt drank orange juice.

## 2021-08-31 NOTE — ED Triage Notes (Signed)
Altered, abd pain, baseline unsure

## 2021-08-31 NOTE — ED Notes (Signed)
Will recath patient and resend urine per ed md prior to sending blood culturtes

## 2021-08-31 NOTE — Progress Notes (Signed)
Peripherally Inserted Central Catheter Placement  The IV Nurse has discussed with the patient and/or persons authorized to consent for the patient, the purpose of this procedure and the potential benefits and risks involved with this procedure.  The benefits include less needle sticks, lab draws from the catheter, and the patient may be discharged home with the catheter. Risks include, but not limited to, infection, bleeding, blood clot (thrombus formation), and puncture of an artery; nerve damage and irregular heartbeat and possibility to perform a PICC exchange if needed/ordered by physician.  Alternatives to this procedure were also discussed.  Bard Power PICC patient education guide, fact sheet on infection prevention and patient information card has been provided to patient /or left at bedside. PICC placed by Rosalio Macadamia RN.   PICC Placement Documentation  PICC Single Lumen 08/31/21 Right Brachial 33 cm 0 cm (Active)  Indication for Insertion or Continuance of Line Poor Vasculature-patient has had multiple peripheral attempts or PIVs lasting less than 24 hours 08/31/21 2229  Exposed Catheter (cm) 0 cm 08/31/21 2229  Site Assessment Clean, Dry, Intact 08/31/21 2229  Line Status Blood return noted;Flushed;Saline locked 08/31/21 2229  Dressing Type Transparent;Securing device 08/31/21 2229  Dressing Status Clean, Dry, Intact;Antimicrobial disc in place 08/31/21 2229  Safety Lock Not Applicable 76/80/88 1103  Line Care Connections checked and tightened 08/31/21 2229  Line Adjustment (NICU/IV Team Only) No 08/31/21 2229  Dressing Intervention New dressing 08/31/21 2229  Dressing Change Due 09/07/21 08/31/21 2229       Yesenia Shepard 08/31/2021, 10:33 PM

## 2021-08-31 NOTE — ED Notes (Signed)
Urine sample sent to lab at this time ?

## 2021-08-31 NOTE — Consult Note (Signed)
Pharmacy Antibiotic Note  Yesenia Shepard is a 71 y.o. female admitted on 08/31/2021 with UTI.  Pharmacy has been consulted for Zosyn dosing.  Plan: Zosyn 3.375g IV q8h (4 hour infusion).  Height: 5\' 7"  (170.2 cm) Weight: 82 kg (180 lb 12.4 oz) IBW/kg (Calculated) : 61.6  Temp (24hrs), Avg:98.5 F (36.9 C), Min:98.5 F (36.9 C), Max:98.5 F (36.9 C)  Recent Labs  Lab 08/31/21 1201  WBC 9.1  CREATININE 1.19*    Estimated Creatinine Clearance: 47.8 mL/min (A) (by C-G formula based on SCr of 1.19 mg/dL (H)).    No Known Allergies  Antimicrobials this admission: 2/23 Zosyn >>   Dose adjustments this admission:n/a  Microbiology results: 2/23 UCx: sent    Thank you for allowing pharmacy to be a part of this patients care.  Carynn Felling Rodriguez-Guzman PharmD, BCPS 08/31/2021 4:16 PM

## 2021-08-31 NOTE — H&P (Signed)
History and Physical    Yesenia Shepard KDT:[id number] DOB: 1951-04-10 DOA: 08/31/2021  Referring MD/NP/PA:   PCP: [name], [name], MD   Patient coming from:  The patient is coming from SNF  Chief Complaint: AMS   HPI: Yesenia Shepard is a 71 y.o. female with medical history significant of hypertension, hyperlipidemia, diabetes mellitus, GERD, depression with anxiety, UTI, PVD, panic attack, chronic pain syndrome on methadone currently, former smoker, HCV, liver cirrhosis, CKD-4, DVT on Eliquis, who presents with altered mental status.  Patient has AMS,  and is unable to provide accurate medical history, therefore, most of the history is obtained by discussing the case with ED physician, per EMS report, and with the nursing staff.  Per report, and a normal baseline, patient is orientated x3.  In the past 3 days, patient was found to be confused.  Patient is currently taking Linezolid for UTI.  No active nausea, vomiting, diarrhea noted.  No cough, respiratory distress or shortness of breath noted.  Patient does not seem to have chest pain.  Patient seems to have lower abdominal tenderness on palpation.  Not sure if patient has symptoms of UTI.  No hematuria noted.  When I saw patient in the ED, she knows her own name, but is not orientated to time and place. She moves all extremities.    Data Reviewed and ED Course: pt was found to have positive urinalysis (yellow appearance, large amount of leukocyte, positive nitrite, many bacteria, WBC > 50), pending COVID PCR, stable renal function, WBC 9.1, temperature normal, blood pressure 148/84, heart rate 85, RR 17, oxygen saturation 99% on room air.  CT of head is negative for acute intracranial abnormalities.    X-ray: Left upper lobe nodular density may be related to EKG clip. Follow-up chest x-ray recommended to exclude underlying lesion. Otherwise lungs are clear.  CT-abd/pelvis: 1. Inflammatory changes of the distal sigmoid colon and  rectum, consistent with proctocolitis. 2. Mild bilateral hydronephrosis, right-greater-than-left. Urinary bladder is decompressed by a Foley catheter. 3. Nonobstructive right nephrolithiasis. 4. Age-indeterminate compression fracture of the L1 vertebral body superior endplate. Recommend correlation for point tenderness. If clinically indicated, MRI of the lumbar spine could be performed to help determine acuity. 5.  Aortic Atherosclerosis (ICD10-I70.0).    EKG: I have personally reviewed.  Sinus rhythm, QTc 455, early R wave progression, nonspecific T wave change  Allergy: No Known Allergies  Past Medical History:  Diagnosis Date   Anxiety    Arthritis    Chronic pain    Depression    Diabetes mellitus without complication ([place])    Drug abuse ([place])    History of polysubstance abuse; currently on methadone.   Hepatitis    History of Hep "C". treated and cured with Harvoni   History of chicken pox    Hypertension    Panic attacks    Peripheral vascular disease ([place])    stent in place.    UTI (urinary tract infection)    History of    Past Surgical History:  Procedure Laterality Date   ABDOMINAL HYSTERECTOMY  1989   AMPUTATION TOE Left 10/26/2018   Procedure: 5TH RAY RESCTION;  Surgeon: Albertine Patricia, DPM;  Location: [facility];  Service: Podiatry;  Laterality: Left;   APPLICATION OF WOUND VAC N/A 02/05/2019   Procedure: APPLICATION OF WOUND VAC POSS DEBRIDEMENT;  Surgeon: Fredirick Maudlin, MD;  Location: [facility];  Service: General;  Laterality: N/A;   APPLICATION OF WOUND VAC  02/03/2019  Procedure: APPLICATION OF WOUND VAC;  Surgeon: Fredirick Maudlin, MD;  Location: [facility];  Service: General;;   CHOLECYSTECTOMY  1987   INTRACAPSULAR CATARACT EXTRACTION Left    IRRIGATION AND DEBRIDEMENT ABSCESS N/A 02/03/2019   Procedure: IRRIGATION AND DEBRIDEMENT SACRAL DECUBITUS;  Surgeon: Fredirick Maudlin, MD;  Location: [facility];  Service: General;  Laterality: N/A;   LOWER  EXTREMITY ANGIOGRAPHY Left 12/17/2017   Procedure: LOWER EXTREMITY ANGIOGRAPHY;  Surgeon: Katha Cabal, MD;  Location: [place] [facility];  Service: Cardiovascular;  Laterality: Left;   LOWER EXTREMITY ANGIOGRAPHY Left 10/24/2018   Procedure: LOWER EXTREMITY ANGIOGRAPHY;  Surgeon: Algernon Huxley, MD;  Location: [place] [facility];  Service: Cardiovascular;  Laterality: Left;   PERIPHERAL VASCULAR CATHETERIZATION Left 05/04/2015   Procedure: Lower Extremity Angiography;  Surgeon: Katha Cabal, MD;  Location: [place] [facility];  Service: Cardiovascular;  Laterality: Left;   PERIPHERAL VASCULAR CATHETERIZATION Left 05/04/2015   Procedure: Lower Extremity Intervention;  Surgeon: Katha Cabal, MD;  Location: [place] [facility];  Service: Cardiovascular;  Laterality: Left;   SHOULDER ARTHROSCOPY WITH OPEN ROTATOR CUFF REPAIR Right 01/16/2017   Procedure: SHOULDER ARTHROSCOPY WITH OPEN ROTATOR CUFF REPAIR;  Surgeon: Thornton Park, MD;  Location: [facility];  Service: Orthopedics;  Laterality: Right;   TONSILLECTOMY AND ADENOIDECTOMY  1971    Social History:  reports that she quit smoking about 3 years ago. Her smoking use included cigarettes. She has a 15.00 pack-year smoking history. She has never used smokeless tobacco. She reports that she does not currently use drugs. She reports that she does not drink alcohol.  Family History:  Family History  Problem Relation Age of Onset   Arthritis Mother    Mental illness Mother        depression   Diabetes Mother    Cancer Mother        pancreatic   Cancer Father        colon   Heart disease Father    Diabetes Father    Cancer Daughter        lung     Prior to Admission medications   Medication Sig Start Date End Date Taking? Authorizing Provider  acetaminophen (TYLENOL) 325 MG tablet Take 2 tablets (650 mg total) by mouth every 6 (six) hours as needed for mild pain (or Fever >/= 101). 11/03/18   Stegmayer, Joelene Millin A, PA-C   ALPRAZolam (XANAX XR) 2 MG 24 hr tablet Take 1 tablet (2 mg total) by mouth daily. Take after breakfast 07/07/21   Nita Sells, MD  ARIPiprazole (ABILIFY) 5 MG tablet Take 5 mg by mouth at bedtime.    [provider]  atorvastatin (LIPITOR) 40 MG tablet Take 1 tablet (40 mg total) by mouth daily. 04/10/18   [name], [name], MD  cholecalciferol (VITAMIN D3) 25 MCG (1000 UT) tablet Take 1 tablet (1,000 Units total) by mouth daily. 11/04/18   Stegmayer, Joelene Millin A, PA-C  clopidogrel (PLAVIX) 75 MG tablet TAKE 1 TABLET BY MOUTH ONCE DAILY 12/23/17   Schnier, Dolores Lory, MD  ELIQUIS 5 MG TABS tablet Take 5 mg by mouth 2 (two) times daily. 03/27/19   [provider]  famotidine (PEPCID) 20 MG tablet Take 1 tablet (20 mg total) by mouth daily. 02/12/19   Stark Jock Jude, MD  feeding supplement, GLUCERNA SHAKE, (GLUCERNA SHAKE) LIQD Take 237 mLs by mouth 2 (two) times daily between meals. 11/03/18   Stegmayer, Janalyn Harder, PA-C  ferrous sulfate 325 (65  FE) MG tablet Take 325 mg by mouth 2 (two) times daily.    [provider]  gabapentin (NEURONTIN) 100 MG capsule Take 1 capsule (100 mg total) by mouth every 12 (twelve) hours. 07/07/21   Nita Sells, MD  insulin lispro (HUMALOG) 100 UNIT/ML KwikPen Inject into the skin. Inject subcutaneously before meals per sliding scale. If BS 100-150; 2 units, 201-250; 4 units, 251-300; 6 units, 301-350; 8 units, 351-400; 10 units, 401-450; 12 units. If greater than 450 call MD. 02/21/21   [provider]  LEVEMIR FLEXTOUCH 100 UNIT/ML FlexTouch Pen Inject 10 Units into the skin daily. 06/23/21   [provider]  levofloxacin (LEVAQUIN) 500 MG tablet Take 1 tablet (500 mg total) by mouth daily. Patient not taking: Reported on 07/22/2021 07/08/21   Nita Sells, MD  lisinopril (ZESTRIL) 5 MG tablet Take 5 mg by mouth daily.    [provider]  Melatonin 5 MG TABS Take 5 mg by mouth at bedtime.     [provider]  metFORMIN (GLUCOPHAGE) 1000 MG tablet Take 1,000 mg by mouth daily. Take with a meal. 07/04/21   [provider]  methadone (DOLOPHINE) 10 MG/5ML solution Take 2.5 mLs (5 mg total) by mouth every 8 (eight) hours as needed for pain. Give 65 ml oral 07/07/21   Nita Sells, MD  Polyethylene Glycol 3350 (MIRALAX PO) Take 17 g by mouth daily. Mix in 4 to 8 oz of fluid of choice.    [provider]  traZODone (DESYREL) 150 MG tablet Take 150 mg by mouth at bedtime. 09/08/19   [provider]  traZODone (DESYREL) 50 MG tablet Take 0.5 tablets (25 mg total) by mouth at bedtime as needed for sleep. 07/07/21   Nita Sells, MD    Physical Exam: Vitals:   08/31/21 1330 08/31/21 1400 08/31/21 1430 08/31/21 1500  BP:      Pulse:      Resp: 20 16 14 13   Temp:      TempSrc:      SpO2:      Weight:      Height:       General: Not in acute distress HEENT:       Eyes: PERRL, EOMI, no scleral icterus.       ENT: No discharge from the ears and nose       Neck: No JVD, no bruit, no mass felt. Heme: No neck lymph node enlargement. Cardiac: S1/S2, RRR, No murmurs, No gallops or rubs. Respiratory: No rales, wheezing, rhonchi or rubs. GI: Soft, nondistended, has tenderness in lower abdomen, no organomegaly, BS present. GU: No hematuria Ext: No pitting leg edema bilaterally. 1+DP/PT pulse bilaterally. Musculoskeletal: No joint deformities, No joint redness or warmth, no limitation of ROM in spin. Skin: No rashes.  Neuro: confused, she knows her own name, but she is not orientated to time and place, cranial nerves II-XII grossly intact, moves all extremities normally.  Psych: Patient is not psychotic  Labs on Admission: I have personally reviewed following labs and imaging studies  CBC: Recent Labs  Lab 08/31/21 1201  WBC 9.1  NEUTROABS 6.8  HGB 12.6  HCT 39.9  MCV 89.1  PLT 315*   Basic Metabolic Panel: Recent Labs  Lab  08/31/21 1201  NA 137  K 3.7  CL 100  CO2 28  GLUCOSE 132*  BUN 18  CREATININE 1.19*  CALCIUM 8.9   GFR: Estimated Creatinine Clearance: 47.8 mL/min (A) (by C-G formula based on  SCr of 1.19 mg/dL (H)). Liver Function Tests: Recent Labs  Lab 08/31/21 1201  AST 17  ALT 12  ALKPHOS 73  BILITOT 0.7  PROT 7.5  ALBUMIN 3.7   Recent Labs  Lab 08/31/21 1201  LIPASE 25   No results for input(s): AMMONIA in the last 168 hours. Coagulation Profile: No results for input(s): INR, PROTIME in the last 168 hours. Cardiac Enzymes: No results for input(s): CKTOTAL, CKMB, CKMBINDEX, TROPONINI in the last 168 hours. BNP (last 3 results) No results for input(s): PROBNP in the last 8760 hours. HbA1C: No results for input(s): HGBA1C in the last 72 hours. CBG: No results for input(s): GLUCAP in the last 168 hours. Lipid Profile: No results for input(s): CHOL, HDL, LDLCALC, TRIG, CHOLHDL, LDLDIRECT in the last 72 hours. Thyroid Function Tests: No results for input(s): TSH, T4TOTAL, FREET4, T3FREE, THYROIDAB in the last 72 hours. Anemia Panel: No results for input(s): VITAMINB12, FOLATE, FERRITIN, TIBC, IRON, RETICCTPCT in the last 72 hours. Urine analysis:    Component Value Date/Time   COLORURINE YELLOW (A) 08/31/2021 1201   APPEARANCEUR HAZY (A) 08/31/2021 1201   LABSPEC 1.020 08/31/2021 1201   PHURINE 5.0 08/31/2021 1201   GLUCOSEU NEGATIVE 08/31/2021 1201   HGBUR MODERATE (A) 08/31/2021 1201   BILIRUBINUR NEGATIVE 08/31/2021 1201   BILIRUBINUR negative 01/14/2017 1603   KETONESUR 20 (A) 08/31/2021 1201   PROTEINUR 30 (A) 08/31/2021 1201   UROBILINOGEN 1.0 01/14/2017 1603   NITRITE POSITIVE (A) 08/31/2021 1201   LEUKOCYTESUR LARGE (A) 08/31/2021 1201   Sepsis Labs: @LABRCNTIP (procalcitonin:4,lacticidven:4) ) Recent Results (from the past 240 hour(s))  Resp Panel by RT-PCR (Flu A&B, Covid) Nasopharyngeal Swab     Status: None   Collection Time: 08/31/21  4:00 PM    Specimen: Nasopharyngeal Swab; Nasopharyngeal(NP) swabs in vial transport medium  Result Value Ref Range Status   SARS Coronavirus 2 by RT PCR NEGATIVE NEGATIVE Final    Comment: (NOTE) SARS-CoV-2 target nucleic acids are NOT DETECTED.  The SARS-CoV-2 RNA is generally detectable in upper respiratory specimens during the acute phase of infection. The lowest concentration of SARS-CoV-2 viral copies this assay can detect is 138 copies/mL. A negative result does not preclude SARS-Cov-2 infection and should not be used as the sole basis for treatment or other patient management decisions. A negative result may occur with  improper specimen collection/handling, submission of specimen other than nasopharyngeal swab, presence of viral mutation(s) within the areas targeted by this assay, and inadequate number of viral copies(<138 copies/mL). A negative result must be combined with clinical observations, patient history, and epidemiological information. The expected result is Negative.  Fact Sheet for Patients:  EntrepreneurPulse.com.au  Fact Sheet for Healthcare Providers:  IncredibleEmployment.be  This test is no t yet approved or cleared by the [place] FDA and  has been authorized for detection and/or diagnosis of SARS-CoV-2 by FDA under an Emergency Use Authorization (EUA). This EUA will remain  in effect (meaning this test can be used) for the duration of the COVID-19 declaration under Section 564(b)(1) of the Act, 21 U.S.C.section 360bbb-3(b)(1), unless the authorization is terminated  or revoked sooner.       Influenza A by PCR NEGATIVE NEGATIVE Final   Influenza B by PCR NEGATIVE NEGATIVE Final    Comment: (NOTE) The Xpert Xpress SARS-CoV-2/FLU/RSV plus assay is intended as an aid in the diagnosis of influenza from Nasopharyngeal swab specimens and should not be used as a sole basis for treatment. Nasal washings and aspirates are  unacceptable for Xpert Xpress SARS-CoV-2/FLU/RSV testing.  Fact Sheet for Patients: EntrepreneurPulse.com.au  Fact Sheet for Healthcare Providers: IncredibleEmployment.be  This test is not yet approved or cleared by the [place] FDA and has been authorized for detection and/or diagnosis of SARS-CoV-2 by FDA under an Emergency Use Authorization (EUA). This EUA will remain in effect (meaning this test can be used) for the duration of the COVID-19 declaration under Section 564(b)(1) of the Act, 21 U.S.C. section 360bbb-3(b)(1), unless the authorization is terminated or revoked.  Performed at [facility], [address] [place], [place] [address]      Radiological Exams on Admission: CT HEAD WO CONTRAST (5MM)  Result Date: 08/31/2021 CLINICAL DATA:  Altered mental status EXAM: CT HEAD WITHOUT CONTRAST TECHNIQUE: Contiguous axial images were obtained from the base of the skull through the vertex without intravenous contrast. RADIATION DOSE REDUCTION: This exam was performed according to the departmental dose-optimization program which includes automated exposure control, adjustment of the mA and/or kV according to patient size and/or use of iterative reconstruction technique. COMPARISON:  07/22/2021 FINDINGS: Brain: No acute intracranial findings are seen. There are no signs of bleeding. Cortical sulci are prominent. There is decreased density in subcortical and periventricular white matter. Vascular: There are scattered arterial calcifications. Skull: Unremarkable. Sinuses/Orbits: Unremarkable. Other: None IMPRESSION: No acute intracranial findings are seen in noncontrast CT brain. Atrophy. Small-vessel disease. Electronically Signed   By: Elmer Picker M.D.   On: 08/31/2021 13:20   CT ABDOMEN PELVIS W CONTRAST  Result Date: 08/31/2021 CLINICAL DATA:  Abdominal pain.  Altered mental status. EXAM: CT ABDOMEN AND PELVIS WITH CONTRAST  TECHNIQUE: Multidetector CT imaging of the abdomen and pelvis was performed using the standard protocol following bolus administration of intravenous contrast. RADIATION DOSE REDUCTION: This exam was performed according to the departmental dose-optimization program which includes automated exposure control, adjustment of the mA and/or kV according to patient size and/or use of iterative reconstruction technique. CONTRAST:  158mL OMNIPAQUE IOHEXOL 300 MG/ML  SOLN COMPARISON:  None. FINDINGS: Lower chest: No acute abnormality. Left anterior descending coronary artery calcifications. Hepatobiliary: No focal liver abnormality is seen. Status post cholecystectomy. No biliary dilatation. Pancreas: Diffuse severe parenchymal atrophy with fatty infiltration. No pancreatic ductal dilatation or surrounding inflammatory changes. Spleen: Normal in size without focal abnormality. Adrenals/Urinary Tract: Adrenal glands are unremarkable. A 3.6 cm exophytic cortical simple cyst is noted at the middle third of the left kidney (series 2, image 38). Punctate nonobstructive calyceal stone is noted at the lower pole of the right kidney (series 2, image 31). Mild bilateral hydronephrosis. Urinary bladder is decompressed with a Foley catheter. Stomach/Bowel: Stomach is within normal limits. The appendix is not directly visualized, however there are no pericecal inflammatory changes. Diffuse wall thickening and mucosal hyperemia of the distal sigmoid colon and rectum (series 2, images 67-76). No pneumatosis. Vascular/Lymphatic: Aortic atherosclerosis. Vascular stent graft within the left common iliac artery. No enlarged abdominal or pelvic lymph nodes. Reproductive: Status post hysterectomy. No adnexal masses. Other: No abdominal wall hernia or abnormality. No abdominopelvic ascites. Musculoskeletal: Age indeterminate compression fracture of the L1 vertebral body superior endplate with approximately 25% vertebral body height loss. Anterior  wedging of the T8 vertebral body. IMPRESSION: 1. Inflammatory changes of the distal sigmoid colon and rectum, consistent with proctocolitis. 2. Mild bilateral hydronephrosis, right-greater-than-left. Urinary bladder is decompressed by a Foley catheter. 3. Nonobstructive right nephrolithiasis. 4. Age-indeterminate compression fracture of the L1 vertebral body superior endplate. Recommend correlation for point tenderness. If  clinically indicated, MRI of the lumbar spine could be performed to help determine acuity. 5.  Aortic Atherosclerosis (ICD10-I70.0). Electronically Signed   By: Ileana Roup M.D.   On: 08/31/2021 13:37   DG Chest Portable 1 View  Result Date: 08/31/2021 CLINICAL DATA:  Short of breath EXAM: PORTABLE CHEST 1 VIEW COMPARISON:  07/07/2021 FINDINGS: Heart size and vascularity normal. Right lung is clear. No effusion Question soft tissue nodule left upper lobe. This may be related to an adjacent EKG monitoring clip. No lesion is seen in this area on the prior study. IMPRESSION: Left upper lobe nodular density may be related to EKG clip. Follow-up chest x-ray recommended to exclude underlying lesion. Otherwise lungs are clear. Electronically Signed   By: Franchot Gallo M.D.   On: 08/31/2021 12:51   [place] EKG SITE RITE  Result Date: 08/31/2021 If [facility] image not attached, placement could not be confirmed due to current cardiac rhythm.     Assessment/Plan Principal Problem:   UTI (urinary tract infection) Active Problems:   Proctocolitis   Acute metabolic encephalopathy   Anxiety and depression   Essential hypertension   HLD (hyperlipidemia)   Hepatic cirrhosis (HCC)   CKD (chronic kidney disease), stage IV (HCC)   Type II diabetes mellitus with renal manifestations (HCC)   PVD (peripheral vascular disease) (HCC)   Chronic pain   DVT (deep venous thrombosis) (HCC)   UTI (urinary tract infection): pt has hx of vancomycin resistant Enterococcus. Pt has been given Linezolid,  supposed to take it from 2/20-2/24. He UA is still positive. Pt also has proctocolitis as shown by CT scanning.  Patient does not have fever or leukocytosis.  No sepsis.  -Placed on MedSurg bed for observation -Started Zosyn -Follow-up blood culture and urine culture  Addendum: per RN report, the first IV had to be obtained by ultrasound, but was pulled out by patient.  IV team is unable put another IV line.  Ordered PICC line.  Proctocolitis: -on Zosyn as above  Acute metabolic encephalopathy: Etiology is not clear. CT of head is negative for acute intracranial abnormalities.  Possibly due to UTI.  Another potential differential diagnosis is hepatic encephalopathy given history of liver cirrhosis. -Frequent neurochecks -Fall precautions -Follow-up ammonia level  Anxiety and depression: -Continue home Abilify, Pristiq, Xanax  Essential hypertension -IV hydralazine as needed -Lisinopril  HLD (hyperlipidemia) -Lipitor  Hepatic cirrhosis ([place]): Liver function is normal -Follow-up ammonia level -check INR and PTT  CKD (chronic kidney disease), stage IV ([place]): Patient's renal function has improved recently.  GFR is 49 today.  Renal function stable. -Follow-up with BMP  Type II diabetes mellitus with renal manifestations [facility]): Recent A1c 5.9, well controlled.  Patient is taking metformin, Humalog and Levemir 10 units daily -Sliding scale insulin -Glargine insulin 70 units daily  PVD (peripheral vascular disease) (HCC) -Continue Lipitor  Chronic pain -Continue home methadone  DVT (deep venous thrombosis) ([place]): per note by Dr. Juluis Pitch on 04/05/20, pt has hx of DVT. Currently on Eliquis -continue Eliquis       DVT ppx: on Eliquis  Code Status: DNR (pt has DNR document with her, MOST form)  Family Communication:       Yes, patient's  sister      by phone  Disposition Plan:  Anticipate discharge back to previous environment. SNF  Consults called:   none  Admission status and Level of care: Med-Surg:   obs as   Severity of Illness:  The appropriate patient status  for this patient is OBSERVATION. Observation status is judged to be reasonable and necessary in order to provide the required intensity of service to ensure the patient's safety. The patient's presenting symptoms, physical exam findings, and initial radiographic and laboratory data in the context of their medical condition is felt to place them at decreased risk for further clinical deterioration. Furthermore, it is anticipated that the patient will be medically stable for discharge from the hospital within 2 midnights of admission.        Date of Service 08/31/2021    Ivor Costa [facility]   If 7PM-7AM, please contact night-coverage [email or web address] 08/31/2021, 5:35 PM

## 2021-08-31 NOTE — Progress Notes (Signed)
VAST consulted to obtain IV access after patient removed IV in her left arm per ER RN. Assessed bilateral arms with ultrasound; no appropriate vessels noted for USGIV placment. Attempted to place 24G IV in hand, but was unsuccessful. ER RN notified.

## 2021-08-31 NOTE — ED Notes (Signed)
RN offered Pt orange juice to drink, pt refused. Pt has meal tray in front of them and RN is assisting pt with eating.

## 2021-09-01 ENCOUNTER — Inpatient Hospital Stay: Payer: Medicare HMO

## 2021-09-01 DIAGNOSIS — G9341 Metabolic encephalopathy: Secondary | ICD-10-CM | POA: Diagnosis present

## 2021-09-01 DIAGNOSIS — G894 Chronic pain syndrome: Secondary | ICD-10-CM | POA: Diagnosis present

## 2021-09-01 DIAGNOSIS — F410 Panic disorder [episodic paroxysmal anxiety] without agoraphobia: Secondary | ICD-10-CM | POA: Diagnosis present

## 2021-09-01 DIAGNOSIS — Z87891 Personal history of nicotine dependence: Secondary | ICD-10-CM | POA: Diagnosis not present

## 2021-09-01 DIAGNOSIS — Z8249 Family history of ischemic heart disease and other diseases of the circulatory system: Secondary | ICD-10-CM | POA: Diagnosis not present

## 2021-09-01 DIAGNOSIS — Z20822 Contact with and (suspected) exposure to covid-19: Secondary | ICD-10-CM | POA: Diagnosis present

## 2021-09-01 DIAGNOSIS — I129 Hypertensive chronic kidney disease with stage 1 through stage 4 chronic kidney disease, or unspecified chronic kidney disease: Secondary | ICD-10-CM | POA: Diagnosis present

## 2021-09-01 DIAGNOSIS — E1122 Type 2 diabetes mellitus with diabetic chronic kidney disease: Secondary | ICD-10-CM | POA: Diagnosis present

## 2021-09-01 DIAGNOSIS — Z8261 Family history of arthritis: Secondary | ICD-10-CM | POA: Diagnosis not present

## 2021-09-01 DIAGNOSIS — N390 Urinary tract infection, site not specified: Secondary | ICD-10-CM | POA: Diagnosis not present

## 2021-09-01 DIAGNOSIS — N136 Pyonephrosis: Secondary | ICD-10-CM | POA: Diagnosis present

## 2021-09-01 DIAGNOSIS — E876 Hypokalemia: Secondary | ICD-10-CM | POA: Diagnosis present

## 2021-09-01 DIAGNOSIS — Z818 Family history of other mental and behavioral disorders: Secondary | ICD-10-CM | POA: Diagnosis not present

## 2021-09-01 DIAGNOSIS — N184 Chronic kidney disease, stage 4 (severe): Secondary | ICD-10-CM | POA: Diagnosis present

## 2021-09-01 DIAGNOSIS — F32A Depression, unspecified: Secondary | ICD-10-CM | POA: Diagnosis present

## 2021-09-01 DIAGNOSIS — K529 Noninfective gastroenteritis and colitis, unspecified: Secondary | ICD-10-CM | POA: Diagnosis not present

## 2021-09-01 DIAGNOSIS — Z89422 Acquired absence of other left toe(s): Secondary | ICD-10-CM | POA: Diagnosis not present

## 2021-09-01 DIAGNOSIS — Z9049 Acquired absence of other specified parts of digestive tract: Secondary | ICD-10-CM | POA: Diagnosis not present

## 2021-09-01 DIAGNOSIS — L89616 Pressure-induced deep tissue damage of right heel: Secondary | ICD-10-CM | POA: Diagnosis present

## 2021-09-01 DIAGNOSIS — R413 Other amnesia: Secondary | ICD-10-CM | POA: Diagnosis not present

## 2021-09-01 DIAGNOSIS — K7460 Unspecified cirrhosis of liver: Secondary | ICD-10-CM | POA: Diagnosis present

## 2021-09-01 DIAGNOSIS — B961 Klebsiella pneumoniae [K. pneumoniae] as the cause of diseases classified elsewhere: Secondary | ICD-10-CM | POA: Diagnosis present

## 2021-09-01 DIAGNOSIS — E1151 Type 2 diabetes mellitus with diabetic peripheral angiopathy without gangrene: Secondary | ICD-10-CM | POA: Diagnosis present

## 2021-09-01 DIAGNOSIS — Z86718 Personal history of other venous thrombosis and embolism: Secondary | ICD-10-CM | POA: Diagnosis not present

## 2021-09-01 DIAGNOSIS — R4182 Altered mental status, unspecified: Secondary | ICD-10-CM | POA: Diagnosis present

## 2021-09-01 DIAGNOSIS — Z833 Family history of diabetes mellitus: Secondary | ICD-10-CM | POA: Diagnosis not present

## 2021-09-01 DIAGNOSIS — E785 Hyperlipidemia, unspecified: Secondary | ICD-10-CM | POA: Diagnosis present

## 2021-09-01 DIAGNOSIS — Z66 Do not resuscitate: Secondary | ICD-10-CM | POA: Diagnosis present

## 2021-09-01 LAB — URINE DRUG SCREEN, QUALITATIVE (ARMC ONLY)
Amphetamines, Ur Screen: NOT DETECTED
Barbiturates, Ur Screen: NOT DETECTED
Benzodiazepine, Ur Scrn: NOT DETECTED
Cannabinoid 50 Ng, Ur ~~LOC~~: NOT DETECTED
Cocaine Metabolite,Ur ~~LOC~~: NOT DETECTED
MDMA (Ecstasy)Ur Screen: NOT DETECTED
Methadone Scn, Ur: POSITIVE — AB
Opiate, Ur Screen: NOT DETECTED
Phencyclidine (PCP) Ur S: NOT DETECTED
Tricyclic, Ur Screen: NOT DETECTED

## 2021-09-01 LAB — GLUCOSE, CAPILLARY
Glucose-Capillary: 113 mg/dL — ABNORMAL HIGH (ref 70–99)
Glucose-Capillary: 126 mg/dL — ABNORMAL HIGH (ref 70–99)
Glucose-Capillary: 96 mg/dL (ref 70–99)
Glucose-Capillary: 111 mg/dL — ABNORMAL HIGH (ref 70–99)

## 2021-09-01 LAB — BASIC METABOLIC PANEL
Anion gap: 8 (ref 5–15)
BUN: 16 mg/dL (ref 8–23)
CO2: 27 mmol/L (ref 22–32)
Calcium: 8.6 mg/dL — ABNORMAL LOW (ref 8.9–10.3)
Chloride: 103 mmol/L (ref 98–111)
Creatinine, Ser: 0.9 mg/dL (ref 0.44–1.00)
GFR, Estimated: >60 mL/min (ref >60)
Glucose, Bld: 97 mg/dL (ref 70–99)
Potassium: 3.1 mmol/L — ABNORMAL LOW (ref 3.5–5.1)
Sodium: 138 mmol/L (ref 135–145)

## 2021-09-01 LAB — AMMONIA: Ammonia: <10 umol/L (ref 9–35)

## 2021-09-01 MED ORDER — POTASSIUM CHLORIDE CRYS ER 20 MEQ PO TBCR: 40.0000 meq | EXTENDED_RELEASE_TABLET | Freq: Once | ORAL | Status: DC

## 2021-09-01 MED ORDER — ASCORBIC ACID 500 MG PO TABS
500.0000 mg | ORAL_TABLET | Freq: Two times a day (BID) | ORAL | Status: DC
Start: 2021-09-01 — End: 2021-09-05
  Administered 2021-09-02 – 2021-09-05 (×6): 500 mg via ORAL
  Filled 2021-09-01 (×7): qty 1

## 2021-09-01 MED ORDER — ENSURE MAX PROTEIN PO LIQD
11.0000 [oz_av] | Freq: Every day | ORAL | Status: DC
Start: 2021-09-01 — End: 2021-09-05
  Administered 2021-09-04: 11 [oz_av] via ORAL
  Filled 2021-09-01: qty 330

## 2021-09-01 MED ORDER — ZINC SULFATE 220 (50 ZN) MG PO CAPS
220.0000 mg | ORAL_CAPSULE | Freq: Every day | ORAL | Status: DC
Start: 2021-09-01 — End: 2021-09-05
  Administered 2021-09-02 – 2021-09-05 (×4): 220 mg via ORAL
  Filled 2021-09-01 (×4): qty 1

## 2021-09-01 MED ORDER — GLUCERNA SHAKE PO LIQD
237.0000 mL | Freq: Two times a day (BID) | ORAL | Status: DC
Start: 2021-09-01 — End: 2021-09-05
  Administered 2021-09-02 – 2021-09-05 (×8): 237 mL via ORAL

## 2021-09-01 MED ORDER — POTASSIUM CHLORIDE 10 MEQ/100ML IV SOLN
10.0000 meq | INTRAVENOUS | Status: AC
  Administered 2021-09-01 (×4): 10 meq via INTRAVENOUS
  Filled 2021-09-01 (×4): qty 100

## 2021-09-01 MED ORDER — ADULT MULTIVITAMIN W/MINERALS CH
1.0000 | ORAL_TABLET | Freq: Every day | ORAL | Status: DC
Start: 2021-09-01 — End: 2021-09-05
  Administered 2021-09-03 – 2021-09-05 (×3): 1 via ORAL
  Filled 2021-09-01 (×4): qty 1

## 2021-09-01 NOTE — Consult Note (Signed)
Pharmacy Antibiotic Note  Yesenia Shepard is a 71 y.o. female admitted on 08/31/2021 with UTI.  Pharmacy has been consulted for Zosyn dosing.  Plan: Continue Zosyn 3.375g IV q8h (4 hour infusion).  Height: 5\' 7"  (170.2 cm) Weight: 68.6 kg (151 lb 3.8 oz) IBW/kg (Calculated) : 61.6  Temp (24hrs), Avg:97.9 F (36.6 C), Min:97.7 F (36.5 C), Max:98.4 F (36.9 C)  Recent Labs  Lab 08/31/21 1201 09/01/21 0306  WBC 9.1  --   CREATININE 1.19* 0.90     Estimated Creatinine Clearance: 55.8 mL/min (by C-G formula based on SCr of 0.9 mg/dL).    No Known Allergies  Antimicrobials this admission: 2/23 Zosyn >>   Dose adjustments this admission:n/a  Microbiology results: 2/23 UCx: sent    Thank you for allowing pharmacy to be a part of this patients care.  Pearla Dubonnet, PharmD Clinical Pharmacist 09/01/2021 1:54 PM

## 2021-09-01 NOTE — Progress Notes (Signed)
PROGRESS NOTE    Yesenia Shepard  JOI:[id number] DOB: 1951/02/09 DOA: 08/31/2021 PCP: [name], [name], MD    Assessment & Plan:   Principal Problem:   UTI (urinary tract infection) Active Problems:   Anxiety and depression   Essential hypertension   HLD (hyperlipidemia)   Hepatic cirrhosis (HCC)   CKD (chronic kidney disease), stage IV (HCC)   Proctocolitis   Type II diabetes mellitus with renal manifestations (HCC)   Acute metabolic encephalopathy   PVD (peripheral vascular disease) (HCC)   Chronic pain   DVT (deep venous thrombosis) (HCC)     UTI: has hx of vancomycin resistant Enterococcus. Pt has been given Linezolid, supposed to take it from 2/20-2/24. Urine cx is pending. Continue on IV zosyn    Proctocolitis: as per CT. Continue on IV zosyn. Blood cxs are NGTD   Acute metabolic encephalopathy: etiology uncler. CT of head is negative for acute intracranial abnormalities.  Possibly due to UTI vs cirrhosis. MRI brain ordered. Unknown pt's mental baseline   Depression: severity unknown. Continue on abilify, pristiq    Anxiety: severity unknown.Continue on xanax    HTN: continue on lisinopril. IV hydralazine prn    HLD: continue on statin  Hepatic cirrhosis: does not take any meds for this as per med rec    CKDIV: Cr is labile    DM2: well controlled, HbA1c 5.9. Continue on glargine, SSI w/ accuchecks    PVD: continue on statin   Chronic pain: continue on home dose of metadone    DVT :  continue on eliquis      DVT prophylaxis: eliquis  Code Status: DNR Family Communication: called pt's sister, [name], no answer so I left a message  Disposition Plan: depends on PT/OT recs (not consulted yet)  Level of care: Med-Surg  Status is: Inpatient Remains inpatient appropriate because: still w/ AMS of unknown etiology         Consultants:    Procedures:   Antimicrobials: zosyn   Subjective: Pt c/o not feeling well but cannot explain any  further   Objective: Vitals:   08/31/21 2059 08/31/21 2130 09/01/21 0100 09/01/21 0230  BP: (!) 160/74 (!) 142/63 (!) 160/57 (!) 172/82  Pulse: 76 76 70 79  Resp: 15 11 19 17   Temp: 98 F (36.7 C)   97.9 F (36.6 C)  TempSrc: Oral     SpO2: 100% 98% 95% 100%  Weight:    68.6 kg  Height:        Intake/Output Summary (Last 24 hours) at 09/01/2021 0519 Last data filed at 08/31/2021 1558 Gross per 24 hour  Intake 1000 ml  Output --  Net 1000 ml   Filed Weights   08/31/21 1156 09/01/21 0230  Weight: 82 kg 68.6 kg    Examination:  General exam: Appears lethargic and confused   Respiratory system: diminished breath sounds b/l Cardiovascular system: S1 & S2+. No  rubs, gallops or clicks.  Gastrointestinal system: Abdomen is nondistended, soft and nontender. Normal bowel sounds heard. Central nervous system:Lethargic. Moves all extremities  Psychiatry: Judgement and insight appear poor. Flat mood and affect   Data Reviewed: I have personally reviewed following labs and imaging studies  CBC: Recent Labs  Lab 08/31/21 1201  WBC 9.1  NEUTROABS 6.8  HGB 12.6  HCT 39.9  MCV 89.1  PLT 470*   Basic Metabolic Panel: Recent Labs  Lab 08/31/21 1201 09/01/21 0306  NA 137 138  K 3.7 3.1*  CL 100 103  CO2 28 27  GLUCOSE 132* 97  BUN 18 16  CREATININE 1.19* 0.90  CALCIUM 8.9 8.6*   GFR: Estimated Creatinine Clearance: 55.8 mL/min (by C-G formula based on SCr of 0.9 mg/dL). Liver Function Tests: Recent Labs  Lab 08/31/21 1201  AST 17  ALT 12  ALKPHOS 73  BILITOT 0.7  PROT 7.5  ALBUMIN 3.7   Recent Labs  Lab 08/31/21 1201  LIPASE 25   Recent Labs  Lab 09/01/21 0306  AMMONIA <10   Coagulation Profile: Recent Labs  Lab 08/31/21 1836  INR 1.2   Cardiac Enzymes: No results for input(s): CKTOTAL, CKMB, CKMBINDEX, TROPONINI in the last 168 hours. BNP (last 3 results) No results for input(s): PROBNP in the last 8760 hours. HbA1C: No results for  input(s): HGBA1C in the last 72 hours. CBG: Recent Labs  Lab 08/31/21 1945 08/31/21 2033 08/31/21 2342  GLUCAP 69* 115* 116*   Lipid Profile: No results for input(s): CHOL, HDL, LDLCALC, TRIG, CHOLHDL, LDLDIRECT in the last 72 hours. Thyroid Function Tests: No results for input(s): TSH, T4TOTAL, FREET4, T3FREE, THYROIDAB in the last 72 hours. Anemia Panel: No results for input(s): VITAMINB12, FOLATE, FERRITIN, TIBC, IRON, RETICCTPCT in the last 72 hours. Sepsis Labs: No results for input(s): PROCALCITON, LATICACIDVEN in the last 168 hours.  Recent Results (from the past 240 hour(s))  Resp Panel by RT-PCR (Flu A&B, Covid) Nasopharyngeal Swab     Status: None   Collection Time: 08/31/21  4:00 PM   Specimen: Nasopharyngeal Swab; Nasopharyngeal(NP) swabs in vial transport medium  Result Value Ref Range Status   SARS Coronavirus 2 by RT PCR NEGATIVE NEGATIVE Final    Comment: (NOTE) SARS-CoV-2 target nucleic acids are NOT DETECTED.  The SARS-CoV-2 RNA is generally detectable in upper respiratory specimens during the acute phase of infection. The lowest concentration of SARS-CoV-2 viral copies this assay can detect is 138 copies/mL. A negative result does not preclude SARS-Cov-2 infection and should not be used as the sole basis for treatment or other patient management decisions. A negative result may occur with  improper specimen collection/handling, submission of specimen other than nasopharyngeal swab, presence of viral mutation(s) within the areas targeted by this assay, and inadequate number of viral copies(<138 copies/mL). A negative result must be combined with clinical observations, patient history, and epidemiological information. The expected result is Negative.  Fact Sheet for Patients:  EntrepreneurPulse.com.au  Fact Sheet for Healthcare Providers:  IncredibleEmployment.be  This test is no t yet approved or cleared by the [place] FDA and  has been authorized for detection and/or diagnosis of SARS-CoV-2 by FDA under an Emergency Use Authorization (EUA). This EUA will remain  in effect (meaning this test can be used) for the duration of the COVID-19 declaration under Section 564(b)(1) of the Act, 21 U.S.C.section 360bbb-3(b)(1), unless the authorization is terminated  or revoked sooner.       Influenza A by PCR NEGATIVE NEGATIVE Final   Influenza B by PCR NEGATIVE NEGATIVE Final    Comment: (NOTE) The Xpert Xpress SARS-CoV-2/FLU/RSV plus assay is intended as an aid in the diagnosis of influenza from Nasopharyngeal swab specimens and should not be used as a sole basis for treatment. Nasal washings and aspirates are unacceptable for Xpert Xpress SARS-CoV-2/FLU/RSV testing.  Fact Sheet for Patients: EntrepreneurPulse.com.au  Fact Sheet for Healthcare Providers: IncredibleEmployment.be  This test is not yet approved or cleared by the [place] FDA and has been authorized for detection and/or diagnosis of SARS-CoV-2 by FDA  under an Emergency Use Authorization (EUA). This EUA will remain in effect (meaning this test can be used) for the duration of the COVID-19 declaration under Section 564(b)(1) of the Act, 21 U.S.C. section 360bbb-3(b)(1), unless the authorization is terminated or revoked.  Performed at [facility], [address], [place], [place] [address]          Radiology Studies: CT HEAD WO CONTRAST (5MM)  Result Date: 08/31/2021 CLINICAL DATA:  Altered mental status EXAM: CT HEAD WITHOUT CONTRAST TECHNIQUE: Contiguous axial images were obtained from the base of the skull through the vertex without intravenous contrast. RADIATION DOSE REDUCTION: This exam was performed according to the departmental dose-optimization program which includes automated exposure control, adjustment of the mA and/or kV according to patient size and/or use of  iterative reconstruction technique. COMPARISON:  07/22/2021 FINDINGS: Brain: No acute intracranial findings are seen. There are no signs of bleeding. Cortical sulci are prominent. There is decreased density in subcortical and periventricular white matter. Vascular: There are scattered arterial calcifications. Skull: Unremarkable. Sinuses/Orbits: Unremarkable. Other: None IMPRESSION: No acute intracranial findings are seen in noncontrast CT brain. Atrophy. Small-vessel disease. Electronically Signed   By: Elmer Picker M.D.   On: 08/31/2021 13:20   CT ABDOMEN PELVIS W CONTRAST  Result Date: 08/31/2021 CLINICAL DATA:  Abdominal pain.  Altered mental status. EXAM: CT ABDOMEN AND PELVIS WITH CONTRAST TECHNIQUE: Multidetector CT imaging of the abdomen and pelvis was performed using the standard protocol following bolus administration of intravenous contrast. RADIATION DOSE REDUCTION: This exam was performed according to the departmental dose-optimization program which includes automated exposure control, adjustment of the mA and/or kV according to patient size and/or use of iterative reconstruction technique. CONTRAST:  135mL OMNIPAQUE IOHEXOL 300 MG/ML  SOLN COMPARISON:  None. FINDINGS: Lower chest: No acute abnormality. Left anterior descending coronary artery calcifications. Hepatobiliary: No focal liver abnormality is seen. Status post cholecystectomy. No biliary dilatation. Pancreas: Diffuse severe parenchymal atrophy with fatty infiltration. No pancreatic ductal dilatation or surrounding inflammatory changes. Spleen: Normal in size without focal abnormality. Adrenals/Urinary Tract: Adrenal glands are unremarkable. A 3.6 cm exophytic cortical simple cyst is noted at the middle third of the left kidney (series 2, image 38). Punctate nonobstructive calyceal stone is noted at the lower pole of the right kidney (series 2, image 31). Mild bilateral hydronephrosis. Urinary bladder is decompressed with a Foley  catheter. Stomach/Bowel: Stomach is within normal limits. The appendix is not directly visualized, however there are no pericecal inflammatory changes. Diffuse wall thickening and mucosal hyperemia of the distal sigmoid colon and rectum (series 2, images 67-76). No pneumatosis. Vascular/Lymphatic: Aortic atherosclerosis. Vascular stent graft within the left common iliac artery. No enlarged abdominal or pelvic lymph nodes. Reproductive: Status post hysterectomy. No adnexal masses. Other: No abdominal wall hernia or abnormality. No abdominopelvic ascites. Musculoskeletal: Age indeterminate compression fracture of the L1 vertebral body superior endplate with approximately 25% vertebral body height loss. Anterior wedging of the T8 vertebral body. IMPRESSION: 1. Inflammatory changes of the distal sigmoid colon and rectum, consistent with proctocolitis. 2. Mild bilateral hydronephrosis, right-greater-than-left. Urinary bladder is decompressed by a Foley catheter. 3. Nonobstructive right nephrolithiasis. 4. Age-indeterminate compression fracture of the L1 vertebral body superior endplate. Recommend correlation for point tenderness. If clinically indicated, MRI of the lumbar spine could be performed to help determine acuity. 5.  Aortic Atherosclerosis (ICD10-I70.0). Electronically Signed   By: Ileana Roup M.D.   On: 08/31/2021 13:37   DG Chest Portable 1 View  Result Date:  08/31/2021 CLINICAL DATA:  Short of breath EXAM: PORTABLE CHEST 1 VIEW COMPARISON:  07/07/2021 FINDINGS: Heart size and vascularity normal. Right lung is clear. No effusion Question soft tissue nodule left upper lobe. This may be related to an adjacent EKG monitoring clip. No lesion is seen in this area on the prior study. IMPRESSION: Left upper lobe nodular density may be related to EKG clip. Follow-up chest x-ray recommended to exclude underlying lesion. Otherwise lungs are clear. Electronically Signed   By: Franchot Gallo M.D.   On: 08/31/2021  12:51   [place] EKG SITE RITE  Result Date: 08/31/2021 If [facility] image not attached, placement could not be confirmed due to current cardiac rhythm.       Scheduled Meds:  apixaban  5 mg Oral BID   ARIPiprazole  10 mg Oral QHS   atorvastatin  40 mg Oral QPM   Chlorhexidine Gluconate Cloth  6 each Topical Daily   cholecalciferol  1,000 Units Oral Daily   famotidine  20 mg Oral Daily   ferrous sulfate  325 mg Oral BID   gabapentin  100 mg Oral Q12H   insulin aspart  0-5 Units Subcutaneous QHS   insulin aspart  0-9 Units Subcutaneous TID WC   insulin glargine-yfgn  7 Units Subcutaneous QHS   lisinopril  10 mg Oral Daily   methadone  25 mg Oral q AM   senna-docusate  1 tablet Oral q AM   sodium chloride flush  10-40 mL Intracatheter Q12H   tamsulosin  0.4 mg Oral q AM   venlafaxine XR  37.5 mg Oral Q breakfast   Continuous Infusions:  sodium chloride 100 mL/hr at 09/01/21 0239   piperacillin-tazobactam (ZOSYN)  IV Stopped (09/01/21 0600)     LOS: 0 days    Time spent: 25 mins     Wyvonnia Dusky, MD [facility] Pager [phone]  If 7PM-7AM, please contact night-coverage 09/01/2021, 5:19 AM

## 2021-09-01 NOTE — Evaluation (Signed)
Clinical/Bedside Swallow Evaluation Patient Details  Name: Yesenia Shepard MRN: [id number] Date of Birth: Jun 14, 1951  Today's Date: 09/01/2021 Time: SLP Start Time (ACUTE ONLY): 9935 SLP Stop Time (ACUTE ONLY): 1755 SLP Time Calculation (min) (ACUTE ONLY): 25 min  Past Medical History:  Past Medical History:  Diagnosis Date   Anxiety    Arthritis    Chronic pain    Depression    Diabetes mellitus without complication ([place])    Drug abuse ([place])    History of polysubstance abuse; currently on methadone.   Hepatitis    History of Hep "C". treated and cured with Harvoni   History of chicken pox    Hypertension    Panic attacks    Peripheral vascular disease ([place])    stent in place.    UTI (urinary tract infection)    History of   Past Surgical History:  Past Surgical History:  Procedure Laterality Date   ABDOMINAL HYSTERECTOMY  1989   AMPUTATION TOE Left 10/26/2018   Procedure: 5TH RAY RESCTION;  Surgeon: Albertine Patricia, DPM;  Location: [facility];  Service: Podiatry;  Laterality: Left;   APPLICATION OF WOUND VAC N/A 02/05/2019   Procedure: APPLICATION OF WOUND VAC POSS DEBRIDEMENT;  Surgeon: Fredirick Maudlin, MD;  Location: [facility];  Service: General;  Laterality: N/A;   APPLICATION OF WOUND VAC  02/03/2019   Procedure: APPLICATION OF WOUND VAC;  Surgeon: Fredirick Maudlin, MD;  Location: [facility];  Service: General;;   CHOLECYSTECTOMY  1987   INTRACAPSULAR CATARACT EXTRACTION Left    IRRIGATION AND DEBRIDEMENT ABSCESS N/A 02/03/2019   Procedure: IRRIGATION AND DEBRIDEMENT SACRAL DECUBITUS;  Surgeon: Fredirick Maudlin, MD;  Location: [facility];  Service: General;  Laterality: N/A;   LOWER EXTREMITY ANGIOGRAPHY Left 12/17/2017   Procedure: LOWER EXTREMITY ANGIOGRAPHY;  Surgeon: Katha Cabal, MD;  Location: [facility];  Service: Cardiovascular;  Laterality: Left;   LOWER EXTREMITY ANGIOGRAPHY Left 10/24/2018   Procedure: LOWER EXTREMITY ANGIOGRAPHY;  Surgeon: Algernon Huxley,  MD;  Location: [place] [facility];  Service: Cardiovascular;  Laterality: Left;   PERIPHERAL VASCULAR CATHETERIZATION Left 05/04/2015   Procedure: Lower Extremity Angiography;  Surgeon: Katha Cabal, MD;  Location: [place];  Service: Cardiovascular;  Laterality: Left;   PERIPHERAL VASCULAR CATHETERIZATION Left 05/04/2015   Procedure: Lower Extremity Intervention;  Surgeon: Katha Cabal, MD;  Location: [place] [facility];  Service: Cardiovascular;  Laterality: Left;   SHOULDER ARTHROSCOPY WITH OPEN ROTATOR CUFF REPAIR Right 01/16/2017   Procedure: SHOULDER ARTHROSCOPY WITH OPEN ROTATOR CUFF REPAIR;  Surgeon: Thornton Park, MD;  Location: [facility];  Service: Orthopedics;  Laterality: Right;   TONSILLECTOMY AND ADENOIDECTOMY  1971   HPI:       Assessment / Plan / Recommendation  Clinical Impression  Pt presents with oral phase swallow impairments that are cognitive in nature as indicated completion of bedside swallow assessment. Therapist assisting pt to achieve upright positioning for purposes of PO intake. Limited oral motor assessment was unremarkable for deficits. Pt seen with trials of dinner meal (regular solids, soft solids, puree, and thin liquids via cup and straw). No s/sx of aspiration noted t/o trials. Oral phase mildly disorganized with extended time provided for oral clearance and minimal oral residue across trials. Oral phase impacted by current cognitive status, leading to need for consistent cues for attention to task, repeat trials, and initiation for self feeding. Limited dentition (w/o presence of dentures in the room) impacted timing and efficiency  of mastication.   Given cognitive based impairment, recommend Dys 2 (chopped solids) with Thin Liquids. Recommend assistance with intake, medications crushed in puree, and adherence to aspiration precautions (slow rate, small bites, elevated HOB, and maintained alertness t/o PO intake) to bolster pt safety for  intake. No follow up speech therapy intervention indicated at this time. SLP Visit Diagnosis: Dysphagia, oral phase (R13.11)    Aspiration Risk  Mild aspiration risk    Diet Recommendation     Medication Administration: Crushed with puree    Other  Recommendations Oral Care Recommendations: Oral care BID    Recommendations for follow up therapy are one component of a multi-disciplinary discharge planning process, led by the attending physician.  Recommendations may be updated based on patient status, additional functional criteria and insurance authorization.  Follow up Recommendations No SLP follow up      Assistance Recommended at Discharge    Functional Status Assessment Patient has had a recent decline in their functional status and/or demonstrates limited ability to make significant improvements in function in a reasonable and predictable amount of time  Frequency and Duration            Prognosis        Swallow Study   General Date of Onset: 09/01/21 Type of Study: Bedside Swallow Evaluation Previous Swallow Assessment: none in chart Diet Prior to this Study: Regular;Thin liquids Temperature Spikes Noted: No History of Recent Intubation: No Behavior/Cognition: Alert;Confused;Requires cueing Oral Cavity Assessment: Within Functional Limits Oral Care Completed by SLP: Yes Oral Cavity - Dentition: Missing dentition Vision: Impaired for self-feeding Self-Feeding Abilities: Needs set up;Needs assist Patient Positioning: Upright in bed Baseline Vocal Quality: Normal Volitional Cough: Cognitively unable to elicit Volitional Swallow: Unable to elicit    Oral/Motor/Sensory Function Overall Oral Motor/Sensory Function: Within functional limits   Ice Chips Ice chips: Not tested   Thin Liquid Thin Liquid: Within functional limits    Nectar Thick Nectar Thick Liquid: Not tested   Honey Thick Honey Thick Liquid: Not tested   Puree Puree: Within functional limits    Solid     Solid: Impaired Presentation: Spoon Oral Phase Impairments: Poor awareness of bolus;Impaired mastication Oral Phase Functional Implications: Prolonged oral transit;Impaired mastication Pharyngeal Phase Impairments:  (none)     [place]ton Clapp MS CCC-SLP  [place] J Clapp 09/01/2021,6:37 PM

## 2021-09-01 NOTE — ED Notes (Signed)
RN place pt in new gown and changed pt into a new brief.   RN attempted to give pt PO medication. Pt is alert but would not open eyes so pt can take medication. RN did not feel comfortable giving medication since pt does not want to follow commands.

## 2021-09-01 NOTE — Progress Notes (Signed)
Initial Nutrition Assessment  DOCUMENTATION CODES:   Not applicable  INTERVENTION:   -Glucerna Shake po BID, each supplement provides 220 kcal and 10 grams of protein  -Ensure Max po daily, each supplement provides 150 kcal and 30 grams of protein -MVI with minerals daily -500 mg vitamin C BID -220 mg zinc sulfate daily x 14 days  NUTRITION DIAGNOSIS:   Increased nutrient needs related to wound healing as evidenced by estimated needs.  GOAL:   Patient will meet greater than or equal to 90% of their needs  MONITOR:   PO intake, Supplement acceptance, Labs, Weight trends, Skin, I & O's  REASON FOR ASSESSMENT:   Low Braden    ASSESSMENT:   Yesenia Shepard is a 71 y.o. female with medical history significant of hypertension, hyperlipidemia, diabetes mellitus, GERD, depression with anxiety, UTI, PVD, panic attack, chronic pain syndrome on methadone currently, former smoker, HCV, liver cirrhosis, CKD-4, DVT on Eliquis, who presents with altered mental status.  Pt admitted with AMS.   Reviewed I/O's: +650 ml x 24 hours  UOP: 350 ml x 24 hours   Pt very lethargic at time of visit. She did not respond to voice or touch. Breakfast tray was unattempted.   Reviewed wt hx; pt has experienced a 20.4% wt loss over the past 2 months, which is significant for time frame.   Medications reviewed and include vitamin D, ferrous sulfate, senokot, and 0. 9% sodium chloride infusion @ 100 ml/hr.   Lab Results  Component Value Date   HGBA1C 5.9 (H) 07/06/2021   PTA DM medications are 10 units insulin detemir daily and 0-12 units insulin lispro TID.   Labs reviewed: K: 3.1, CBGS: 69-126 (inpatient orders for glycemic control are 0-5 units insulin aspart daily at bedtime, 0-9 units insulin aspart TID with meals, and 7 units insulin glargine-yfgn daily).    NUTRITION - FOCUSED PHYSICAL EXAM:  Flowsheet Row Most Recent Value  Orbital Region No depletion  Upper Arm Region Mild depletion   Thoracic and Lumbar Region No depletion  Buccal Region No depletion  Temple Region No depletion  Clavicle Bone Region No depletion  Clavicle and Acromion Bone Region No depletion  Scapular Bone Region No depletion  Dorsal Hand No depletion  Patellar Region No depletion  Anterior Thigh Region No depletion  Posterior Calf Region No depletion  Edema (RD Assessment) Mild  Hair Reviewed  Eyes Reviewed  Mouth Reviewed  Skin Reviewed  Nails Reviewed       Diet Order:   Diet Order             Diet heart healthy/carb modified Room service appropriate? Yes; Fluid consistency: Thin  Diet effective now                   EDUCATION NEEDS:   Not appropriate for education at this time  Skin:  Skin Assessment: Skin Integrity Issues: Skin Integrity Issues:: DTI DTI: rt heel  Last BM:  09/01/21  Height:   Ht Readings from Last 1 Encounters:  08/31/21 5\' 7"  (1.702 m)    Weight:   Wt Readings from Last 1 Encounters:  09/01/21 68.6 kg    Ideal Body Weight:  61.4 kg  BMI:  Body mass index is 23.69 kg/m.  Estimated Nutritional Needs:   Kcal:  1950-2150  Protein:  105-120 grams  Fluid:  > 1.9 L    Loistine Chance, RD, LDN, [place] Registered Dietitian II Certified Diabetes Care and Education Specialist Please refer  to [facility] for RD and/or RD on-call/weekend/after hours pager

## 2021-09-01 NOTE — ED Notes (Signed)
Pt is currently resting. Chest expansion symmetrical and respirations are unlabored.

## 2021-09-01 NOTE — TOC Initial Note (Signed)
Transition of Care [facility]) - Initial/Assessment Note    Patient Details  Name: Yesenia Shepard MRN: [id number] Date of Birth: 1951-04-19  Transition of Care [facility]) CM/SW Contact:    Pete Pelt, RN Phone Number: 09/01/2021, 2:59 PM  Clinical Narrative:      RNCM spoke with Marry Guan (Milbert Coulter (Sister) [phone] [facility])       POA stated that patient is a resident of [facility] and they do plan for her to return upon discharge.  POA posed no questions or concerns at this time.  TOC contact information was provided  RNCM reached out to [name] at [facility].  Patient is a long term resident there and is able to return upon discharge.  TOC to follow.  Expected Discharge Plan: [place] ([facility]) Barriers to Discharge: Continued Medical Work up   Patient Goals and CMS Choice     Choice offered to / list presented to : NA  Expected Discharge Plan and Services Expected Discharge Plan: [place] ([facility])   Discharge Planning Services: CM Consult   Living arrangements for the past 2 months: [place]                                      Prior Living Arrangements/Services Living arrangements for the past 2 months: [place] Lives with:: Facility Resident   Do you feel safe going back to the place where you live?: Yes      Need for Family Participation in Patient Care: Yes (Comment) Care giver support system in place?: Yes (comment)   Criminal Activity/Legal Involvement Pertinent to Current Situation/Hospitalization: No - Comment as needed  Activities of Daily Living      Permission Sought/Granted Permission sought to share information with : Case Manager, Customer service manager Permission granted to share information with : Yes, Verbal Permission Granted     Permission granted to share info w AGENCY: [facility]        Emotional Assessment Appearance:: Appears stated age        Alcohol / Substance Use: Not Applicable Psych Involvement: No (comment)  Admission diagnosis:  UTI (urinary tract infection) [N39.0] Altered mental status, unspecified altered mental status type [R41.82] Patient Active Problem List   Diagnosis Date Noted   Proctocolitis 08/31/2021   Type II diabetes mellitus with renal manifestations ([place]) 32/99/2426   Acute metabolic encephalopathy 83/41/9622   PVD (peripheral vascular disease) ([place]) 08/31/2021   Chronic pain 08/31/2021   DVT (deep venous thrombosis) ([place]) 08/31/2021   Atypical pneumonia 07/06/2021   Acquired absence of other left toe(s) ([place]) 10/20/2020   Decubitus ulcer, infected 01/28/2019   Pressure injury of skin 12/24/2018   Sepsis ([place]) 12/22/2018   CKD (chronic kidney disease), stage IV ([place]) 12/22/2018   UTI (urinary tract infection) 12/22/2018   Elevated CK 12/22/2018   Atherosclerotic peripheral vascular disease with ulceration ([place]) 10/24/2018   Atherosclerosis of artery of extremity with ulceration ([place]) 10/24/2018   Atherosclerosis of native arteries of extremity with rest pain ([place]) 10/01/2018   Chronic midline low back pain with bilateral sciatica 04/23/2018   Vitamin D deficiency 04/10/2018   Swelling of limb 03/01/2018   Atherosclerosis of native arteries of extremity with intermittent claudication ([place]) 01/03/2018   Leg pain 12/12/2017   S/P right rotator cuff repair 01/16/2017   Preoperative examination 01/14/2017   Full thickness rotator cuff tear 12/24/2016  Right shoulder pain 09/06/2016   Hepatic cirrhosis ([place]) 02/09/2016   Chronic hepatitis C without hepatic coma ([place]) 11/09/2015   HLD (hyperlipidemia) 08/20/2015   Peripheral vascular disease ([place]) 08/12/2015   Essential hypertension 08/12/2015   Type 2 diabetes mellitus with other specified complication, unspecified whether long term insulin use ([place]) 04/19/2015   Current smoker 03/15/2015   History of recreational drug use 03/15/2015    Osteoarthritis 03/15/2015   Preventative health care 03/15/2015   Ulcer of left lower leg, limited to breakdown of skin ([place]) 03/15/2015   Anxiety and depression 08/25/2012   PCP:  [name], [name], MD Pharmacy:   [facility] [address] [place] [address] Phone: [phone] Fax: [phone]     Social Determinants of Health (SDOH) Interventions    Readmission Risk Interventions Readmission Risk Prevention Plan 07/07/2021 01/30/2019  Transportation Screening Complete Complete  PCP or Specialist Appt within 3-5 Days Complete -  Social Work Consult for [place] Planning/Counseling Complete -  Palliative Care Screening Not Applicable -  Medication Review Press photographer) Complete Complete  HRI or [place] - Complete  SW Recovery Care/Counseling Consult - Complete  Palliative Care Screening - Not Applicable  Skilled Nursing Facility - Complete  Some recent data might be hidden

## 2021-09-02 DIAGNOSIS — K529 Noninfective gastroenteritis and colitis, unspecified: Secondary | ICD-10-CM | POA: Diagnosis not present

## 2021-09-02 DIAGNOSIS — N390 Urinary tract infection, site not specified: Secondary | ICD-10-CM | POA: Diagnosis not present

## 2021-09-02 DIAGNOSIS — G9341 Metabolic encephalopathy: Secondary | ICD-10-CM | POA: Diagnosis not present

## 2021-09-02 LAB — COMPREHENSIVE METABOLIC PANEL
ALT: 9 U/L (ref 0–44)
AST: 14 U/L — ABNORMAL LOW (ref 15–41)
Albumin: 3.1 g/dL — ABNORMAL LOW (ref 3.5–5.0)
Alkaline Phosphatase: 64 U/L (ref 38–126)
Anion gap: 11 (ref 5–15)
BUN: 13 mg/dL (ref 8–23)
CO2: 24 mmol/L (ref 22–32)
Calcium: 8.7 mg/dL — ABNORMAL LOW (ref 8.9–10.3)
Chloride: 103 mmol/L (ref 98–111)
Creatinine, Ser: 0.85 mg/dL (ref 0.44–1.00)
GFR, Estimated: >60 mL/min (ref >60)
Glucose, Bld: 115 mg/dL — ABNORMAL HIGH (ref 70–99)
Potassium: 3.8 mmol/L (ref 3.5–5.1)
Sodium: 138 mmol/L (ref 135–145)
Total Bilirubin: 0.9 mg/dL (ref 0.3–1.2)
Total Protein: 6.2 g/dL — ABNORMAL LOW (ref 6.5–8.1)

## 2021-09-02 LAB — CBC
HCT: 33.2 % — ABNORMAL LOW (ref 36.0–46.0)
Hemoglobin: 10.6 g/dL — ABNORMAL LOW (ref 12.0–15.0)
MCH: 27.9 pg (ref 26.0–34.0)
MCHC: 31.9 g/dL (ref 30.0–36.0)
MCV: 87.4 fL (ref 80.0–100.0)
Platelets: 361 10*3/uL (ref 150–400)
RBC: 3.8 MIL/uL — ABNORMAL LOW (ref 3.87–5.11)
RDW: 13.7 % (ref 11.5–15.5)
WBC: 7.5 10*3/uL (ref 4.0–10.5)
nRBC: 0 % (ref 0.0–0.2)

## 2021-09-02 LAB — GLUCOSE, CAPILLARY
Glucose-Capillary: 108 mg/dL — ABNORMAL HIGH (ref 70–99)
Glucose-Capillary: 105 mg/dL — ABNORMAL HIGH (ref 70–99)
Glucose-Capillary: 146 mg/dL — ABNORMAL HIGH (ref 70–99)
Glucose-Capillary: 148 mg/dL — ABNORMAL HIGH (ref 70–99)

## 2021-09-02 LAB — MAGNESIUM: Magnesium: 2.1 mg/dL (ref 1.7–2.4)

## 2021-09-02 MED ORDER — METHADONE HCL 5 MG PO TABS
25.0000 mg | ORAL_TABLET | Freq: Every day | ORAL | Status: DC
  Administered 2021-09-02 – 2021-09-05 (×3): 25 mg via ORAL
  Filled 2021-09-02 (×3): qty 5

## 2021-09-02 MED ORDER — METHADONE HCL 5 MG PO TABS: 25.0000 mg | ORAL_TABLET | Freq: Every day | ORAL | Status: DC

## 2021-09-02 NOTE — Evaluation (Signed)
Occupational Therapy Evaluation Patient Details Name: Yesenia Shepard MRN: [id number] DOB: 09-23-1950 Today's Date: 09/02/2021   History of Present Illness Pt is a 71 y/o F admitted on 08/31/21 after presenting with c/o AMS. Pt is being treated for UTI, proctocolitis, & acute metabolic encephalopathy of unclear etiology. PMH: HTN, HLD, DM, GERD, depression, anxiety, UTI, PVD, panic attack, chronic pain syndrome on methadone, former smoker, HCV, liver cirrhosis, CKD4, DVT on eliquis   Clinical Impression   Pt seen for OT evaluation this date in setting of acute hospitalization d/t AMS. She is poor historian. She reports being able to pivot to chair at baseline and states she does not need much assistance at baseline. Chart indicates that pt lives at [facility]. She presents this date with confusion and generalized deconditioning impacting her ability to safely and efficiently contribute to ADLs/ADL mobility. She currently requires MAX A for sup<>sit, MOD A for rolling. She requires MIN A for UB ADLs and MAX A for LB ADLs. She is noted to have significant contractures of both ankles making it unlikely that pt was pivoting to a chair or wheelchair at baseline as pt describes. Pt is returned to bed with all needs met and in reach at end of session. Will continue to follow.      Recommendations for follow up therapy are one component of a multi-disciplinary discharge planning process, led by the attending physician.  Recommendations may be updated based on patient status, additional functional criteria and insurance authorization.   Follow Up Recommendations  Skilled nursing-short term rehab (<3 hours/day)    Assistance Recommended at Discharge Frequent or constant Supervision/Assistance  Patient can return home with the following      Functional Status Assessment  Patient has had a recent decline in their functional status and demonstrates the ability to make significant improvements in function in  a reasonable and predictable amount of time.  Equipment Recommendations  Other (comment) (defer to next level of care)    Recommendations for Other Services       Precautions / Restrictions Precautions Precautions: Fall Restrictions Weight Bearing Restrictions: No      Mobility Bed Mobility Overal bed mobility: Needs Assistance Bed Mobility: Supine to Sit, Sit to Supine     Supine to sit: Max assist, HOB elevated Sit to supine: Max assist, HOB elevated   General bed mobility comments: contributes to transition to EOB sitting, but appears mostly limited d/t cognition/decreased motor planning    Transfers                   General transfer comment: unable/unsafe      Balance Overall balance assessment: Needs assistance Sitting-balance support: Feet supported, Bilateral upper extremity supported Sitting balance-Leahy Scale: Poor         Standing balance comment: unable/unsafe                           ADL either performed or assessed with clinical judgement   ADL                                         General ADL Comments: MIN A for seated UB ADLs, MAX A for seated LB ADLs, MOD A for rolling and MAX A for sup<>sit     Vision   Additional Comments: unsure of baseline, unable to formally assess. Pt  appears to track appropriately     Perception     Praxis      Pertinent Vitals/Pain Pain Assessment Pain Assessment: Faces Breathing: normal Negative Vocalization: none Facial Expression: smiling or inexpressive Body Language: tense, distressed pacing, fidgeting Consolability: no need to console PAINAD Score: 1 Pain Location: some gimacing with sup to sit Pain Descriptors / Indicators: Discomfort, Grimacing Pain Intervention(s): Limited activity within patient's tolerance, Monitored during session, Repositioned     Hand Dominance     Extremity/Trunk Assessment Upper Extremity Assessment Upper Extremity Assessment:  Generalized weakness   Lower Extremity Assessment Lower Extremity Assessment: Generalized weakness (limited PF/DF with L worse than R, ankle contracted) RLE Deficits / Details: plantarflexion contracture with PT unable to perform any ankle ROM, PT unable to achieve full knee extension with PROM LLE Deficits / Details: plantarflexion contracture with PT unable to perform any ankle ROM, ankle also inverted, PT unable to achieve full knee extension with PROM   Cervical / Trunk Assessment Cervical / Trunk Assessment: Kyphotic (rounded shoulders, forward head)   Communication Communication Communication: No difficulties   Cognition Arousal/Alertness: Awake/alert Behavior During Therapy: WFL for tasks assessed/performed, Flat affect Overall Cognitive Status: No family/caregiver present to determine baseline cognitive functioning                                 General Comments: Pt only oriented to self (Name only), unable to state her age or birthday. Per chart Pt is AxOx3 at baseline.     General Comments       Exercises     Shoulder Instructions      Home Living Family/patient expects to be discharged to:: Skilled nursing facility                                        Prior Functioning/Environment Prior Level of Function : Needs assist;Patient poor historian/Family not available             Mobility Comments: Pt reports that she is w/c bound but that she is normally able to transfer to/from commode, bed, etc w/o assist using stand-pivot, question the efficacy of this report ADLs Comments: Pt reports that she is usually able to get dressed and manage self care w/o much assistance        OT Problem List: Decreased strength;Decreased range of motion;Decreased activity tolerance;Impaired balance (sitting and/or standing);Decreased cognition;Decreased safety awareness;Decreased knowledge of use of DME or AE      OT Treatment/Interventions:  Self-care/ADL training;Therapeutic activities;DME and/or AE instruction    OT Goals(Current goals can be found in the care plan section) Acute Rehab OT Goals OT Goal Formulation: Patient unable to participate in goal setting Time For Goal Achievement: 09/16/21 Potential to Achieve Goals: Fair ADL Goals Pt Will Perform Eating: with set-up;sitting (with F sitting balance with <10% cues to initiate) Pt Will Perform Grooming: with set-up;sitting (unsupported sitting with F balance, with <10% cues to perform 1-2 familiar g/h tasks.) Pt/caregiver will Perform Home Exercise Program: Increased strength;Both right and left upper extremity;With Supervision  OT Frequency: Min 2X/week    Co-evaluation              AM-PAC OT "6 Clicks" Daily Activity     Outcome Measure Help from another person eating meals?: A Little Help from another person taking care of personal grooming?:  A Little Help from another person toileting, which includes using toliet, bedpan, or urinal?: A Lot Help from another person bathing (including washing, rinsing, drying)?: A Lot Help from another person to put on and taking off regular upper body clothing?: A Little Help from another person to put on and taking off regular lower body clothing?: Total 6 Click Score: 14   End of Session Nurse Communication: Mobility status  Activity Tolerance: Patient tolerated treatment well Patient left: in bed;with call bell/phone within reach;with bed alarm set  OT Visit Diagnosis: Muscle weakness (generalized) (M62.81);Other symptoms and signs involving cognitive function                Time: 7341-9379 OT Time Calculation (min): 31 min Charges:  OT General Charges $OT Visit: 1 Visit OT Evaluation $OT Eval Moderate Complexity: 1 Mod OT Treatments $Self Care/Home Management : 8-22 mins  Gerrianne Scale, MS, OTR/L ascom [phone] 09/02/21, 3:38 PM

## 2021-09-02 NOTE — Evaluation (Signed)
Physical Therapy Evaluation Patient Details Name: Yesenia Shepard MRN: [id number] DOB: 1951/04/28 Today's Date: 09/02/2021  History of Present Illness  Pt is a 71 y/o F admitted on 08/31/21 after presenting with c/o AMS. Pt is being treated for UTI, proctocolitis, & acute metabolic encephalopathy of unclear etiology. PMH: HTN, HLD, DM, GERD, depression, anxiety, UTI, PVD, panic attack, chronic pain syndrome on methadone, former smoker, HCV, liver cirrhosis, CKD4, DVT on eliquis  Clinical Impression  Pt seen for PT evaluation with pt received in bed with gown half off. PT provides total assist for donning new gown. Pt only oriented to self (name only, unable to state age nor birthday). Pt requires max assist for supine<>sit, but is able to come semi-fowler>long sitting in bed without assistance. PT observes BLE knee & ankle contractures despite chart stating pt was able to complete transfers at baseline. At this time, pt appears to be impaired physically & cognitively, with cognition primarily limiting her at this time. Pt would benefit from STR upon d/c to maximize independence with functional mobility & reduce fall risk.        Recommendations for follow up therapy are one component of a multi-disciplinary discharge planning process, led by the attending physician.  Recommendations may be updated based on patient status, additional functional criteria and insurance authorization.  Follow Up Recommendations Skilled nursing-short term rehab (<3 hours/day)    Assistance Recommended at Discharge Frequent or constant Supervision/Assistance  Patient can return home with the following  Two people to help with walking and/or transfers;Two people to help with bathing/dressing/bathroom;Assistance with feeding;Assist for transportation    Equipment Recommendations None recommended by PT (TBD in next venue)  Recommendations for Other Services       Functional Status Assessment Patient has had a recent  decline in their functional status and demonstrates the ability to make significant improvements in function in a reasonable and predictable amount of time.     Precautions / Restrictions Precautions Precautions: Fall Restrictions Weight Bearing Restrictions: No      Mobility  Bed Mobility Overal bed mobility: Needs Assistance Bed Mobility: Supine to Sit, Sit to Supine     Supine to sit: Max assist, HOB elevated Sit to supine: Max assist, HOB elevated   General bed mobility comments: Pt appears to have some strength in BLE as she does initiate moving BLE towards EOB but stops. Pt then appears to be limited by impaired cognition & requires max assist for supine<>sit.    Transfers                        Ambulation/Gait                  Stairs            Wheelchair Mobility    Modified Rankin (Stroke Patients Only)       Balance Overall balance assessment: Needs assistance Sitting-balance support: Feet supported, Bilateral upper extremity supported Sitting balance-Leahy Scale: Poor Sitting balance - Comments: close supervision<>min assist static sitting EOB                                     Pertinent Vitals/Pain Pain Assessment Pain Assessment: Faces Pain Location: at end of session when PT assisted with positioning in bed pt states "it hurts" but unable to state where or point to location of pain, pt without verbal complaints upon  PT departure Pain Descriptors / Indicators: Discomfort Pain Intervention(s): Repositioned    Home Living Family/patient expects to be discharged to:: Skilled nursing facility                        Prior Function Prior Level of Function : Needs assist;Patient poor historian/Family not available             Mobility Comments: Pt reports that she is w/c bound but that she is normally able to transfer to/from commode, bed, etc w/o assist using stand-pivot, question the efficacy of this  report ADLs Comments: Pt reports that she is usually able to get dressed and manage self care w/o much assistance     Hand Dominance        Extremity/Trunk Assessment   Upper Extremity Assessment Upper Extremity Assessment: Generalized weakness    Lower Extremity Assessment Lower Extremity Assessment: Generalized weakness;RLE deficits/detail;LLE deficits/detail RLE Deficits / Details: plantarflexion contracture with PT unable to perform any ankle ROM, PT unable to achieve full knee extension with PROM LLE Deficits / Details: plantarflexion contracture with PT unable to perform any ankle ROM, ankle also inverted, PT unable to achieve full knee extension with PROM    Cervical / Trunk Assessment Cervical / Trunk Assessment: Kyphotic (rounded shoulders, forward head)  Communication   Communication: No difficulties  Cognition Arousal/Alertness: Awake/alert   Overall Cognitive Status: No family/caregiver present to determine baseline cognitive functioning                                 General Comments: Pt only oriented to self (Name only), unable to state her age or birthday. Per chart Pt is AxOx3 at baseline.        General Comments      Exercises     Assessment/Plan    PT Assessment Patient needs continued PT services  PT Problem List Decreased strength;Decreased mobility;Decreased safety awareness;Decreased range of motion;Decreased activity tolerance;Decreased balance;Decreased knowledge of use of DME;Cardiopulmonary status limiting activity;Decreased cognition       PT Treatment Interventions DME instruction;Therapeutic exercise;Gait training;Balance training;Neuromuscular re-education;Modalities;Manual techniques;Wheelchair mobility training;Functional mobility training;Therapeutic activities;Patient/family education;Cognitive remediation    PT Goals (Current goals can be found in the Care Plan section)  Acute Rehab PT Goals Patient Stated Goal: none  stated PT Goal Formulation: Patient unable to participate in goal setting Time For Goal Achievement: 09/16/21 Potential to Achieve Goals: Fair    Frequency Min 2X/week     Co-evaluation               AM-PAC PT "6 Clicks" Mobility  Outcome Measure Help needed turning from your back to your side while in a flat bed without using bedrails?: Total Help needed moving from lying on your back to sitting on the side of a flat bed without using bedrails?: Total Help needed moving to and from a bed to a chair (including a wheelchair)?: Total Help needed standing up from a chair using your arms (e.g., wheelchair or bedside chair)?: Total Help needed to walk in hospital room?: Total Help needed climbing 3-5 steps with a railing? : Total 6 Click Score: 6    End of Session   Activity Tolerance: Patient tolerated treatment well Patient left: with call bell/phone within reach;in bed;with bed alarm set   PT Visit Diagnosis: Muscle weakness (generalized) (M62.81);Other abnormalities of gait and mobility (R26.89)    Time: 3220-2542 PT Time  Calculation (min) (ACUTE ONLY): 10 min   Charges:   PT Evaluation $PT Eval Moderate Complexity: [place], PT, DPT 09/02/21, 2:45 PM   Waunita Schooner 09/02/2021, 2:44 PM

## 2021-09-02 NOTE — Progress Notes (Signed)
PROGRESS NOTE    Yesenia Shepard  LYY:[id number] DOB: 1950-07-15 DOA: 08/31/2021 PCP: [name], [name], MD    Assessment & Plan:   Principal Problem:   UTI (urinary tract infection) Active Problems:   Anxiety and depression   Essential hypertension   HLD (hyperlipidemia)   Hepatic cirrhosis (HCC)   CKD (chronic kidney disease), stage IV (HCC)   Proctocolitis   Type II diabetes mellitus with renal manifestations (HCC)   Acute metabolic encephalopathy   PVD (peripheral vascular disease) (HCC)   Chronic pain   DVT (deep venous thrombosis) (HCC)     UTI: has hx of vancomycin resistant Enterococcus. Pt has been given Linezolid, supposed to take it from 2/20-2/24. Continue on IV zosyn. Urine cx is still pending    Proctocolitis: as per CT. Continue on IV zosyn. Blood cxs are NGTD   Acute metabolic encephalopathy: etiology uncler. CT of head is negative for acute intracranial abnormalities.  Possibly due to UTI vs cirrhosis vs delirium vs dementia. MRI brain ordered. Unknown pt's mental baseline   Memory deficits: no formal dx of dementia. Continue w/ supportive care   Depression: severity unknown. Continue on pristiq, abilify    Anxiety: severity unknown. Continue on xanax    HTN: continue on lisinopril. IV hydralazine prn    HLD: continue on statin   Hepatic cirrhosis: likely secondary to HCV. Does not take any meds for this as per med rec    CKDIV: Cr is labile. Avoid nephrotoxic meds    DM2: well controlled, HbA1c 5.9. Continue on glargine, SSI w/ accuchecks     PVD: continue on statin   Chronic pain: continue on home dose of methadone    DVT: continue on eliquis     DVT prophylaxis: eliquis  Code Status: DNR Family Communication: discussed pt's care w/ pt's sister, [name], and answered her questions  Disposition Plan: d/c back home facility, [facility]   Level of care: Med-Surg  Status is: Inpatient Remains inpatient appropriate because: still w/ AMS of  unknown etiology    Consultants:    Procedures:   Antimicrobials: zosyn   Subjective: Pt still c/o not feeling well but unable to explain further.   Objective: Vitals:   09/01/21 2138 09/02/21 0040 09/02/21 0452 09/02/21 0734  BP: (!) 159/82 (!) 158/81 (!) 156/65 (!) 164/84  Pulse: 83 79 69 76  Resp: 16 16 16 16   Temp: 98 F (36.7 C) 98 F (36.7 C) 98 F (36.7 C) 98.1 F (36.7 C)  TempSrc: Oral Oral Oral   SpO2: 100% 100% 100% 100%  Weight:      Height:        Intake/Output Summary (Last 24 hours) at 09/02/2021 0738 Last data filed at 09/02/2021 0500 Gross per 24 hour  Intake 1537.51 ml  Output 1200 ml  Net 337.51 ml   Filed Weights   08/31/21 1156 09/01/21 0230  Weight: 82 kg 68.6 kg    Examination:  General exam: Appears awake but still confused  Respiratory system: decreased breath sounds b/l Cardiovascular system: S1/S2+. No rubs or clicks  Gastrointestinal system: Abd is soft, NT, ND & hypoactive bowel sounds  Central nervous system: Awake and alert. Moves all extremities  Psychiatry: judgement and insight appear poor. Flat mood and affect    Data Reviewed: I have personally reviewed following labs and imaging studies  CBC: Recent Labs  Lab 08/31/21 1201 09/02/21 0636  WBC 9.1 7.5  NEUTROABS 6.8  --   HGB 12.6 10.6*  HCT 39.9 33.2*  MCV 89.1 87.4  PLT 502* 062   Basic Metabolic Panel: Recent Labs  Lab 08/31/21 1201 09/01/21 0306 09/02/21 0636  NA 137 138 138  K 3.7 3.1* 3.8  CL 100 103 103  CO2 28 27 24   GLUCOSE 132* 97 115*  BUN 18 16 13   CREATININE 1.19* 0.90 0.85  CALCIUM 8.9 8.6* 8.7*  MG  --   --  2.1   GFR: Estimated Creatinine Clearance: 59 mL/min (by C-G formula based on SCr of 0.85 mg/dL). Liver Function Tests: Recent Labs  Lab 08/31/21 1201 09/02/21 0636  AST 17 14*  ALT 12 9  ALKPHOS 73 64  BILITOT 0.7 0.9  PROT 7.5 6.2*  ALBUMIN 3.7 3.1*   Recent Labs  Lab 08/31/21 1201  LIPASE 25   Recent Labs  Lab  09/01/21 0306  AMMONIA <10   Coagulation Profile: Recent Labs  Lab 08/31/21 1836  INR 1.2   Cardiac Enzymes: No results for input(s): CKTOTAL, CKMB, CKMBINDEX, TROPONINI in the last 168 hours. BNP (last 3 results) No results for input(s): PROBNP in the last 8760 hours. HbA1C: No results for input(s): HGBA1C in the last 72 hours. CBG: Recent Labs  Lab 09/01/21 0809 09/01/21 1127 09/01/21 1700 09/01/21 2139 09/02/21 0735  GLUCAP 113* 126* 96 111* 108*   Lipid Profile: No results for input(s): CHOL, HDL, LDLCALC, TRIG, CHOLHDL, LDLDIRECT in the last 72 hours. Thyroid Function Tests: No results for input(s): TSH, T4TOTAL, FREET4, T3FREE, THYROIDAB in the last 72 hours. Anemia Panel: No results for input(s): VITAMINB12, FOLATE, FERRITIN, TIBC, IRON, RETICCTPCT in the last 72 hours. Sepsis Labs: No results for input(s): PROCALCITON, LATICACIDVEN in the last 168 hours.  Recent Results (from the past 240 hour(s))  Resp Panel by RT-PCR (Flu A&B, Covid) Nasopharyngeal Swab     Status: None   Collection Time: 08/31/21  4:00 PM   Specimen: Nasopharyngeal Swab; Nasopharyngeal(NP) swabs in vial transport medium  Result Value Ref Range Status   SARS Coronavirus 2 by RT PCR NEGATIVE NEGATIVE Final    Comment: (NOTE) SARS-CoV-2 target nucleic acids are NOT DETECTED.  The SARS-CoV-2 RNA is generally detectable in upper respiratory specimens during the acute phase of infection. The lowest concentration of SARS-CoV-2 viral copies this assay can detect is 138 copies/mL. A negative result does not preclude SARS-Cov-2 infection and should not be used as the sole basis for treatment or other patient management decisions. A negative result may occur with  improper specimen collection/handling, submission of specimen other than nasopharyngeal swab, presence of viral mutation(s) within the areas targeted by this assay, and inadequate number of viral copies(<138 copies/mL). A negative result  must be combined with clinical observations, patient history, and epidemiological information. The expected result is Negative.  Fact Sheet for Patients:  EntrepreneurPulse.com.au  Fact Sheet for Healthcare Providers:  IncredibleEmployment.be  This test is no t yet approved or cleared by the [place] FDA and  has been authorized for detection and/or diagnosis of SARS-CoV-2 by FDA under an Emergency Use Authorization (EUA). This EUA will remain  in effect (meaning this test can be used) for the duration of the COVID-19 declaration under Section 564(b)(1) of the Act, 21 U.S.C.section 360bbb-3(b)(1), unless the authorization is terminated  or revoked sooner.       Influenza A by PCR NEGATIVE NEGATIVE Final   Influenza B by PCR NEGATIVE NEGATIVE Final    Comment: (NOTE) The Xpert Xpress SARS-CoV-2/FLU/RSV plus assay is intended as an aid  in the diagnosis of influenza from Nasopharyngeal swab specimens and should not be used as a sole basis for treatment. Nasal washings and aspirates are unacceptable for Xpert Xpress SARS-CoV-2/FLU/RSV testing.  Fact Sheet for Patients: EntrepreneurPulse.com.au  Fact Sheet for Healthcare Providers: IncredibleEmployment.be  This test is not yet approved or cleared by the [place] FDA and has been authorized for detection and/or diagnosis of SARS-CoV-2 by FDA under an Emergency Use Authorization (EUA). This EUA will remain in effect (meaning this test can be used) for the duration of the COVID-19 declaration under Section 564(b)(1) of the Act, 21 U.S.C. section 360bbb-3(b)(1), unless the authorization is terminated or revoked.  Performed at [facility], [address] [place], [place] [address]   CULTURE, BLOOD (ROUTINE X 2) w Reflex to ID Panel     Status: None (Preliminary result)   Collection Time: 08/31/21  6:36 PM   Specimen: BLOOD  Result Value  Ref Range Status   Specimen Description BLOOD RIGHT FOREARM  Final   Special Requests   Final    BOTTLES DRAWN AEROBIC AND ANAEROBIC Blood Culture adequate volume   Culture   Final    NO GROWTH < 12 HOURS Performed at [facility], [address], [place], [place] [address]    Report Status PENDING  Incomplete  CULTURE, BLOOD (ROUTINE X 2) w Reflex to ID Panel     Status: None (Preliminary result)   Collection Time: 08/31/21  6:36 PM   Specimen: BLOOD  Result Value Ref Range Status   Specimen Description BLOOD LEFT ASSIST CONTROL  Final   Special Requests   Final    BOTTLES DRAWN AEROBIC AND ANAEROBIC Blood Culture adequate volume   Culture   Final    NO GROWTH < 12 HOURS Performed at [facility], [address], [place], [place] [address]    Report Status PENDING  Incomplete         Radiology Studies: CT HEAD WO CONTRAST (5MM)  Result Date: 08/31/2021 CLINICAL DATA:  Altered mental status EXAM: CT HEAD WITHOUT CONTRAST TECHNIQUE: Contiguous axial images were obtained from the base of the skull through the vertex without intravenous contrast. RADIATION DOSE REDUCTION: This exam was performed according to the departmental dose-optimization program which includes automated exposure control, adjustment of the mA and/or kV according to patient size and/or use of iterative reconstruction technique. COMPARISON:  07/22/2021 FINDINGS: Brain: No acute intracranial findings are seen. There are no signs of bleeding. Cortical sulci are prominent. There is decreased density in subcortical and periventricular white matter. Vascular: There are scattered arterial calcifications. Skull: Unremarkable. Sinuses/Orbits: Unremarkable. Other: None IMPRESSION: No acute intracranial findings are seen in noncontrast CT brain. Atrophy. Small-vessel disease. Electronically Signed   By: Elmer Picker M.D.   On: 08/31/2021 13:20   MR BRAIN WO CONTRAST  Result Date: 09/01/2021 CLINICAL  DATA:  Mental status change, unknown cause EXAM: MRI HEAD WITHOUT CONTRAST TECHNIQUE: Multiplanar, multiecho pulse sequences of the brain and surrounding structures were obtained without intravenous contrast. COMPARISON:  No prior MRI, correlation is made with CT head 08/31/2021 FINDINGS: Evaluation is somewhat limited by motion artifact. Brain: No restricted diffusion to suggest acute or subacute infarct. No acute hemorrhage, mass, mass effect, or midline shift. No hydrocephalus or extra-axial collection. T2 hyperintense signal in the periventricular white matter, likely the sequela of mild chronic small vessel ischemic disease. Mildly advanced central atrophy for age. Vascular: Normal flow voids. Skull and upper cervical spine: Normal marrow signal. Sinuses/Orbits:  No acute finding.  Left lens replacement. Other: Trace fluid in right mastoid air cells. IMPRESSION: No acute intracranial process. No etiology is seen for the patient's mental status change. Electronically Signed   By: Merilyn Baba M.D.   On: 09/01/2021 23:44   CT ABDOMEN PELVIS W CONTRAST  Result Date: 08/31/2021 CLINICAL DATA:  Abdominal pain.  Altered mental status. EXAM: CT ABDOMEN AND PELVIS WITH CONTRAST TECHNIQUE: Multidetector CT imaging of the abdomen and pelvis was performed using the standard protocol following bolus administration of intravenous contrast. RADIATION DOSE REDUCTION: This exam was performed according to the departmental dose-optimization program which includes automated exposure control, adjustment of the mA and/or kV according to patient size and/or use of iterative reconstruction technique. CONTRAST:  152mL OMNIPAQUE IOHEXOL 300 MG/ML  SOLN COMPARISON:  None. FINDINGS: Lower chest: No acute abnormality. Left anterior descending coronary artery calcifications. Hepatobiliary: No focal liver abnormality is seen. Status post cholecystectomy. No biliary dilatation. Pancreas: Diffuse severe parenchymal atrophy with fatty  infiltration. No pancreatic ductal dilatation or surrounding inflammatory changes. Spleen: Normal in size without focal abnormality. Adrenals/Urinary Tract: Adrenal glands are unremarkable. A 3.6 cm exophytic cortical simple cyst is noted at the middle third of the left kidney (series 2, image 38). Punctate nonobstructive calyceal stone is noted at the lower pole of the right kidney (series 2, image 31). Mild bilateral hydronephrosis. Urinary bladder is decompressed with a Foley catheter. Stomach/Bowel: Stomach is within normal limits. The appendix is not directly visualized, however there are no pericecal inflammatory changes. Diffuse wall thickening and mucosal hyperemia of the distal sigmoid colon and rectum (series 2, images 67-76). No pneumatosis. Vascular/Lymphatic: Aortic atherosclerosis. Vascular stent graft within the left common iliac artery. No enlarged abdominal or pelvic lymph nodes. Reproductive: Status post hysterectomy. No adnexal masses. Other: No abdominal wall hernia or abnormality. No abdominopelvic ascites. Musculoskeletal: Age indeterminate compression fracture of the L1 vertebral body superior endplate with approximately 25% vertebral body height loss. Anterior wedging of the T8 vertebral body. IMPRESSION: 1. Inflammatory changes of the distal sigmoid colon and rectum, consistent with proctocolitis. 2. Mild bilateral hydronephrosis, right-greater-than-left. Urinary bladder is decompressed by a Foley catheter. 3. Nonobstructive right nephrolithiasis. 4. Age-indeterminate compression fracture of the L1 vertebral body superior endplate. Recommend correlation for point tenderness. If clinically indicated, MRI of the lumbar spine could be performed to help determine acuity. 5.  Aortic Atherosclerosis (ICD10-I70.0). Electronically Signed   By: Ileana Roup M.D.   On: 08/31/2021 13:37   DG Chest Portable 1 View  Result Date: 08/31/2021 CLINICAL DATA:  Short of breath EXAM: PORTABLE CHEST 1 VIEW  COMPARISON:  07/07/2021 FINDINGS: Heart size and vascularity normal. Right lung is clear. No effusion Question soft tissue nodule left upper lobe. This may be related to an adjacent EKG monitoring clip. No lesion is seen in this area on the prior study. IMPRESSION: Left upper lobe nodular density may be related to EKG clip. Follow-up chest x-ray recommended to exclude underlying lesion. Otherwise lungs are clear. Electronically Signed   By: Franchot Gallo M.D.   On: 08/31/2021 12:51   [place] EKG SITE RITE  Result Date: 08/31/2021 If [facility] image not attached, placement could not be confirmed due to current cardiac rhythm.       Scheduled Meds:  apixaban  5 mg Oral BID   ARIPiprazole  10 mg Oral QHS   vitamin C  500 mg Oral BID   atorvastatin  40 mg Oral QPM   Chlorhexidine Gluconate Cloth  6 each Topical Daily   cholecalciferol  1,000 Units Oral Daily   famotidine  20 mg Oral Daily   feeding supplement (GLUCERNA SHAKE)  237 mL Oral BID BM   ferrous sulfate  325 mg Oral BID   gabapentin  100 mg Oral Q12H   insulin aspart  0-5 Units Subcutaneous QHS   insulin aspart  0-9 Units Subcutaneous TID WC   insulin glargine-yfgn  7 Units Subcutaneous QHS   lisinopril  10 mg Oral Daily   methadone  25 mg Oral q AM   multivitamin with minerals  1 tablet Oral Daily   Ensure Max Protein  11 oz Oral QHS   senna-docusate  1 tablet Oral q AM   sodium chloride flush  10-40 mL Intracatheter Q12H   tamsulosin  0.4 mg Oral q AM   venlafaxine XR  37.5 mg Oral Q breakfast   zinc sulfate  220 mg Oral Daily   Continuous Infusions:  sodium chloride 100 mL/hr at 09/02/21 0235   piperacillin-tazobactam (ZOSYN)  IV 3.375 g (09/02/21 5110)     LOS: 1 day    Time spent: 15 mins     Wyvonnia Dusky, MD [facility] Pager [phone]  If 7PM-7AM, please contact night-coverage 09/02/2021, 7:38 AM

## 2021-09-03 DIAGNOSIS — N390 Urinary tract infection, site not specified: Secondary | ICD-10-CM | POA: Diagnosis not present

## 2021-09-03 DIAGNOSIS — K529 Noninfective gastroenteritis and colitis, unspecified: Secondary | ICD-10-CM | POA: Diagnosis not present

## 2021-09-03 DIAGNOSIS — G9341 Metabolic encephalopathy: Secondary | ICD-10-CM | POA: Diagnosis not present

## 2021-09-03 LAB — COMPREHENSIVE METABOLIC PANEL
ALT: 10 U/L (ref 0–44)
AST: 12 U/L — ABNORMAL LOW (ref 15–41)
Albumin: 3 g/dL — ABNORMAL LOW (ref 3.5–5.0)
Alkaline Phosphatase: 51 U/L (ref 38–126)
Anion gap: 7 (ref 5–15)
BUN: 16 mg/dL (ref 8–23)
CO2: 24 mmol/L (ref 22–32)
Calcium: 8.4 mg/dL — ABNORMAL LOW (ref 8.9–10.3)
Chloride: 104 mmol/L (ref 98–111)
Creatinine, Ser: 0.97 mg/dL (ref 0.44–1.00)
GFR, Estimated: >60 mL/min (ref >60)
Glucose, Bld: 121 mg/dL — ABNORMAL HIGH (ref 70–99)
Potassium: 3.5 mmol/L (ref 3.5–5.1)
Sodium: 135 mmol/L (ref 135–145)
Total Bilirubin: 0.6 mg/dL (ref 0.3–1.2)
Total Protein: 5.5 g/dL — ABNORMAL LOW (ref 6.5–8.1)

## 2021-09-03 LAB — CBC
HCT: 31.4 % — ABNORMAL LOW (ref 36.0–46.0)
Hemoglobin: 10 g/dL — ABNORMAL LOW (ref 12.0–15.0)
MCH: 28.2 pg (ref 26.0–34.0)
MCHC: 31.8 g/dL (ref 30.0–36.0)
MCV: 88.5 fL (ref 80.0–100.0)
Platelets: 302 10*3/uL (ref 150–400)
RBC: 3.55 MIL/uL — ABNORMAL LOW (ref 3.87–5.11)
RDW: 13.7 % (ref 11.5–15.5)
WBC: 8.2 10*3/uL (ref 4.0–10.5)
nRBC: 0 % (ref 0.0–0.2)

## 2021-09-03 LAB — GLUCOSE, CAPILLARY
Glucose-Capillary: 114 mg/dL — ABNORMAL HIGH (ref 70–99)
Glucose-Capillary: 166 mg/dL — ABNORMAL HIGH (ref 70–99)
Glucose-Capillary: 161 mg/dL — ABNORMAL HIGH (ref 70–99)
Glucose-Capillary: 134 mg/dL — ABNORMAL HIGH (ref 70–99)
Glucose-Capillary: 99 mg/dL (ref 70–99)

## 2021-09-03 LAB — MAGNESIUM: Magnesium: 2 mg/dL (ref 1.7–2.4)

## 2021-09-03 NOTE — Progress Notes (Signed)
PROGRESS NOTE    Yesenia Shepard  OIZ:[id number] DOB: December 30, 1950 DOA: 08/31/2021 PCP: [name], [name], MD    Assessment & Plan:   Principal Problem:   UTI (urinary tract infection) Active Problems:   Anxiety and depression   Essential hypertension   HLD (hyperlipidemia)   Hepatic cirrhosis (HCC)   CKD (chronic kidney disease), stage IV (HCC)   Proctocolitis   Type II diabetes mellitus with renal manifestations (HCC)   Acute metabolic encephalopathy   PVD (peripheral vascular disease) (HCC)   Chronic pain   DVT (deep venous thrombosis) (HCC)     UTI: has hx of vancomycin resistant Enterococcus. Pt has been given Linezolid, supposed to take it from 2/20-2/24. Continue on IV zosyn. Urine cx growing gram neg rods, sens pending    Proctocolitis: as per CT. Continue on IV zosyn. Blood cxs NGTD   Acute metabolic encephalopathy: etiology uncler. CT of head is negative for acute intracranial abnormalities.  Possibly due to UTI vs cirrhosis vs delirium vs dementia. MRI brain shows no acute intracranial process. Mental status is close to baseline   Memory deficits: no formal dx of dementia. Continue w/ supportive care   Depression: severity unknown. Continue on pristiq, abilify    Anxiety: severity unknown. Xanax prn    HTN: continue on ACE-I. IV hydralazine prn    HLD: continue on statin   Hepatic cirrhosis: likely secondary to HCV. Does not take any meds for this as per med rec    CKDIV: Cr is better than baseline    DM2: HbA1c 5.9, well controlled. Continue on glargine, SSI w/ accuchecks   PVD: continue on statin   Chronic pain: continue on home dose of methadone    DVT: continue on eliquis     DVT prophylaxis: eliquis  Code Status: DNR Family Communication:  Disposition Plan: d/c back home facility, [facility]   Level of care: Med-Surg  Status is: Inpatient Remains inpatient appropriate because: still w/ AMS of unknown etiology    Consultants:     Procedures:   Antimicrobials: zosyn   Subjective: Pt c/o fatigue    Objective: Vitals:   09/02/21 0734 09/02/21 1128 09/02/21 1528 09/02/21 2113  BP: (!) 164/84 (!) 158/78 (!) 143/70 (!) 142/67  Pulse: 76 87 82 95  Resp: 16 18 18 17   Temp: 98.1 F (36.7 C) 97.9 F (36.6 C) 98.2 F (36.8 C) 97.8 F (36.6 C)  TempSrc:  Oral  Oral  SpO2: 100% 100% 100% 99%  Weight:      Height:        Intake/Output Summary (Last 24 hours) at 09/03/2021 0738 Last data filed at 09/03/2021 0719 Gross per 24 hour  Intake 1821.31 ml  Output 1415 ml  Net 406.31 ml   Filed Weights   08/31/21 1156 09/01/21 0230  Weight: 82 kg 68.6 kg    Examination:  General exam: Appears awake and alert Respiratory system: diminished breath sounds b/l Cardiovascular system: S1 & S2+. No rubs or gallops  Gastrointestinal system: Abd is soft, NT, ND & normal bowel sounds  Central nervous system: awake and oriented x3. Moves all extremities  Psychiatry: judgement and insight appear poor. Flat mood and affect   Data Reviewed: I have personally reviewed following labs and imaging studies  CBC: Recent Labs  Lab 08/31/21 1201 09/02/21 0636 09/03/21 0522  WBC 9.1 7.5 8.2  NEUTROABS 6.8  --   --   HGB 12.6 10.6* 10.0*  HCT 39.9 33.2* 31.4*  MCV 89.1  87.4 88.5  PLT 502* 361 096   Basic Metabolic Panel: Recent Labs  Lab 08/31/21 1201 09/01/21 0306 09/02/21 0636 09/03/21 0522  NA 137 138 138 135  K 3.7 3.1* 3.8 3.5  CL 100 103 103 104  CO2 28 27 24 24   GLUCOSE 132* 97 115* 121*  BUN 18 16 13 16   CREATININE 1.19* 0.90 0.85 0.97  CALCIUM 8.9 8.6* 8.7* 8.4*  MG  --   --  2.1 2.0   GFR: Estimated Creatinine Clearance: 51.7 mL/min (by C-G formula based on SCr of 0.97 mg/dL). Liver Function Tests: Recent Labs  Lab 08/31/21 1201 09/02/21 0636 09/03/21 0522  AST 17 14* 12*  ALT 12 9 10   ALKPHOS 73 64 51  BILITOT 0.7 0.9 0.6  PROT 7.5 6.2* 5.5*  ALBUMIN 3.7 3.1* 3.0*   Recent Labs   Lab 08/31/21 1201  LIPASE 25   Recent Labs  Lab 09/01/21 0306  AMMONIA <10   Coagulation Profile: Recent Labs  Lab 08/31/21 1836  INR 1.2   Cardiac Enzymes: No results for input(s): CKTOTAL, CKMB, CKMBINDEX, TROPONINI in the last 168 hours. BNP (last 3 results) No results for input(s): PROBNP in the last 8760 hours. HbA1C: No results for input(s): HGBA1C in the last 72 hours. CBG: Recent Labs  Lab 09/01/21 2139 09/02/21 0735 09/02/21 1125 09/02/21 1530 09/02/21 2116  GLUCAP 111* 108* 105* 146* 148*   Lipid Profile: No results for input(s): CHOL, HDL, LDLCALC, TRIG, CHOLHDL, LDLDIRECT in the last 72 hours. Thyroid Function Tests: No results for input(s): TSH, T4TOTAL, FREET4, T3FREE, THYROIDAB in the last 72 hours. Anemia Panel: No results for input(s): VITAMINB12, FOLATE, FERRITIN, TIBC, IRON, RETICCTPCT in the last 72 hours. Sepsis Labs: No results for input(s): PROCALCITON, LATICACIDVEN in the last 168 hours.  Recent Results (from the past 240 hour(s))  Resp Panel by RT-PCR (Flu A&B, Covid) Nasopharyngeal Swab     Status: None   Collection Time: 08/31/21  4:00 PM   Specimen: Nasopharyngeal Swab; Nasopharyngeal(NP) swabs in vial transport medium  Result Value Ref Range Status   SARS Coronavirus 2 by RT PCR NEGATIVE NEGATIVE Final    Comment: (NOTE) SARS-CoV-2 target nucleic acids are NOT DETECTED.  The SARS-CoV-2 RNA is generally detectable in upper respiratory specimens during the acute phase of infection. The lowest concentration of SARS-CoV-2 viral copies this assay can detect is 138 copies/mL. A negative result does not preclude SARS-Cov-2 infection and should not be used as the sole basis for treatment or other patient management decisions. A negative result may occur with  improper specimen collection/handling, submission of specimen other than nasopharyngeal swab, presence of viral mutation(s) within the areas targeted by this assay, and inadequate  number of viral copies(<138 copies/mL). A negative result must be combined with clinical observations, patient history, and epidemiological information. The expected result is Negative.  Fact Sheet for Patients:  EntrepreneurPulse.com.au  Fact Sheet for Healthcare Providers:  IncredibleEmployment.be  This test is no t yet approved or cleared by the [place] FDA and  has been authorized for detection and/or diagnosis of SARS-CoV-2 by FDA under an Emergency Use Authorization (EUA). This EUA will remain  in effect (meaning this test can be used) for the duration of the COVID-19 declaration under Section 564(b)(1) of the Act, 21 U.S.C.section 360bbb-3(b)(1), unless the authorization is terminated  or revoked sooner.       Influenza A by PCR NEGATIVE NEGATIVE Final   Influenza B by PCR NEGATIVE NEGATIVE Final  Comment: (NOTE) The Xpert Xpress SARS-CoV-2/FLU/RSV plus assay is intended as an aid in the diagnosis of influenza from Nasopharyngeal swab specimens and should not be used as a sole basis for treatment. Nasal washings and aspirates are unacceptable for Xpert Xpress SARS-CoV-2/FLU/RSV testing.  Fact Sheet for Patients: EntrepreneurPulse.com.au  Fact Sheet for Healthcare Providers: IncredibleEmployment.be  This test is not yet approved or cleared by the [place] FDA and has been authorized for detection and/or diagnosis of SARS-CoV-2 by FDA under an Emergency Use Authorization (EUA). This EUA will remain in effect (meaning this test can be used) for the duration of the COVID-19 declaration under Section 564(b)(1) of the Act, 21 U.S.C. section 360bbb-3(b)(1), unless the authorization is terminated or revoked.  Performed at [facility], [address] [place], [place] [address]   CULTURE, BLOOD (ROUTINE X 2) w Reflex to ID Panel     Status: None (Preliminary result)   Collection  Time: 08/31/21  6:36 PM   Specimen: BLOOD  Result Value Ref Range Status   Specimen Description BLOOD RIGHT FOREARM  Final   Special Requests   Final    BOTTLES DRAWN AEROBIC AND ANAEROBIC Blood Culture adequate volume   Culture   Final    NO GROWTH 3 DAYS Performed at [facility], [address], [place], [place] [address]    Report Status PENDING  Incomplete  CULTURE, BLOOD (ROUTINE X 2) w Reflex to ID Panel     Status: None (Preliminary result)   Collection Time: 08/31/21  6:36 PM   Specimen: BLOOD  Result Value Ref Range Status   Specimen Description BLOOD LEFT ASSIST CONTROL  Final   Special Requests   Final    BOTTLES DRAWN AEROBIC AND ANAEROBIC Blood Culture adequate volume   Culture   Final    NO GROWTH 3 DAYS Performed at [facility], [address], [place], [place] [address]    Report Status PENDING  Incomplete         Radiology Studies: MR BRAIN WO CONTRAST  Result Date: 09/01/2021 CLINICAL DATA:  Mental status change, unknown cause EXAM: MRI HEAD WITHOUT CONTRAST TECHNIQUE: Multiplanar, multiecho pulse sequences of the brain and surrounding structures were obtained without intravenous contrast. COMPARISON:  No prior MRI, correlation is made with CT head 08/31/2021 FINDINGS: Evaluation is somewhat limited by motion artifact. Brain: No restricted diffusion to suggest acute or subacute infarct. No acute hemorrhage, mass, mass effect, or midline shift. No hydrocephalus or extra-axial collection. T2 hyperintense signal in the periventricular white matter, likely the sequela of mild chronic small vessel ischemic disease. Mildly advanced central atrophy for age. Vascular: Normal flow voids. Skull and upper cervical spine: Normal marrow signal. Sinuses/Orbits: No acute finding.  Left lens replacement. Other: Trace fluid in right mastoid air cells. IMPRESSION: No acute intracranial process. No etiology is seen for the patient's mental status change.  Electronically Signed   By: Merilyn Baba M.D.   On: 09/01/2021 23:44        Scheduled Meds:  apixaban  5 mg Oral BID   ARIPiprazole  10 mg Oral QHS   vitamin C  500 mg Oral BID   atorvastatin  40 mg Oral QPM   Chlorhexidine Gluconate Cloth  6 each Topical Daily   cholecalciferol  1,000 Units Oral Daily   famotidine  20 mg Oral Daily   feeding supplement (GLUCERNA SHAKE)  237 mL Oral BID BM   ferrous sulfate  325 mg Oral BID  gabapentin  100 mg Oral Q12H   insulin aspart  0-5 Units Subcutaneous QHS   insulin aspart  0-9 Units Subcutaneous TID WC   insulin glargine-yfgn  7 Units Subcutaneous QHS   lisinopril  10 mg Oral Daily   methadone  25 mg Oral Daily   multivitamin with minerals  1 tablet Oral Daily   Ensure Max Protein  11 oz Oral QHS   senna-docusate  1 tablet Oral q AM   sodium chloride flush  10-40 mL Intracatheter Q12H   tamsulosin  0.4 mg Oral q AM   venlafaxine XR  37.5 mg Oral Q breakfast   zinc sulfate  220 mg Oral Daily   Continuous Infusions:  sodium chloride 100 mL/hr at 09/02/21 1358   piperacillin-tazobactam (ZOSYN)  IV 3.375 g (09/03/21 0511)     LOS: 2 days    Time spent: 15 mins     Wyvonnia Dusky, MD [facility] Pager [phone]  If 7PM-7AM, please contact night-coverage 09/03/2021, 7:38 AM

## 2021-09-03 NOTE — Plan of Care (Signed)

## 2021-09-04 DIAGNOSIS — N390 Urinary tract infection, site not specified: Secondary | ICD-10-CM | POA: Diagnosis not present

## 2021-09-04 DIAGNOSIS — R413 Other amnesia: Secondary | ICD-10-CM | POA: Diagnosis not present

## 2021-09-04 DIAGNOSIS — K529 Noninfective gastroenteritis and colitis, unspecified: Secondary | ICD-10-CM | POA: Diagnosis not present

## 2021-09-04 LAB — CBC
HCT: 32.5 % — ABNORMAL LOW (ref 36.0–46.0)
Hemoglobin: 10.3 g/dL — ABNORMAL LOW (ref 12.0–15.0)
MCH: 28.2 pg (ref 26.0–34.0)
MCHC: 31.7 g/dL (ref 30.0–36.0)
MCV: 89 fL (ref 80.0–100.0)
Platelets: 310 10*3/uL (ref 150–400)
RBC: 3.65 MIL/uL — ABNORMAL LOW (ref 3.87–5.11)
RDW: 13.7 % (ref 11.5–15.5)
WBC: 9 10*3/uL (ref 4.0–10.5)
nRBC: 0 % (ref 0.0–0.2)

## 2021-09-04 LAB — COMPREHENSIVE METABOLIC PANEL
ALT: 9 U/L (ref 0–44)
AST: 15 U/L (ref 15–41)
Albumin: 3.1 g/dL — ABNORMAL LOW (ref 3.5–5.0)
Alkaline Phosphatase: 54 U/L (ref 38–126)
Anion gap: 10 (ref 5–15)
BUN: 11 mg/dL (ref 8–23)
CO2: 27 mmol/L (ref 22–32)
Calcium: 8.6 mg/dL — ABNORMAL LOW (ref 8.9–10.3)
Chloride: 103 mmol/L (ref 98–111)
Creatinine, Ser: 0.94 mg/dL (ref 0.44–1.00)
GFR, Estimated: >60 mL/min (ref >60)
Glucose, Bld: 105 mg/dL — ABNORMAL HIGH (ref 70–99)
Potassium: 3.4 mmol/L — ABNORMAL LOW (ref 3.5–5.1)
Sodium: 140 mmol/L (ref 135–145)
Total Bilirubin: 0.6 mg/dL (ref 0.3–1.2)
Total Protein: 6 g/dL — ABNORMAL LOW (ref 6.5–8.1)

## 2021-09-04 LAB — URINE CULTURE: Culture: >=100000 — AB

## 2021-09-04 LAB — MAGNESIUM: Magnesium: 1.8 mg/dL (ref 1.7–2.4)

## 2021-09-04 LAB — GLUCOSE, CAPILLARY
Glucose-Capillary: 115 mg/dL — ABNORMAL HIGH (ref 70–99)
Glucose-Capillary: 116 mg/dL — ABNORMAL HIGH (ref 70–99)
Glucose-Capillary: 175 mg/dL — ABNORMAL HIGH (ref 70–99)
Glucose-Capillary: 152 mg/dL — ABNORMAL HIGH (ref 70–99)

## 2021-09-04 MED ORDER — METRONIDAZOLE 500 MG PO TABS
500.0000 mg | ORAL_TABLET | Freq: Two times a day (BID) | ORAL | Status: DC
  Administered 2021-09-04 – 2021-09-05 (×2): 500 mg via ORAL
  Filled 2021-09-04 (×2): qty 1

## 2021-09-04 MED ORDER — CIPROFLOXACIN HCL 500 MG PO TABS
500.0000 mg | ORAL_TABLET | Freq: Two times a day (BID) | ORAL | Status: DC
  Administered 2021-09-04 – 2021-09-05 (×2): 500 mg via ORAL
  Filled 2021-09-04 (×2): qty 1

## 2021-09-04 MED ORDER — METRONIDAZOLE 500 MG PO TABS
500.0000 mg | ORAL_TABLET | Freq: Three times a day (TID) | ORAL | Status: DC
  Administered 2021-09-04: 14:00:00 500 mg via ORAL
  Filled 2021-09-04: qty 1

## 2021-09-04 NOTE — Progress Notes (Signed)
Physical Therapy Treatment Patient Details Name: Yesenia Shepard MRN: [id number] DOB: 1951/05/12 Today's Date: 09/04/2021   History of Present Illness Pt is a 71 y/o F admitted on 08/31/21 after presenting with c/o AMS. Pt is being treated for UTI, proctocolitis, & acute metabolic encephalopathy of unclear etiology. PMH: HTN, HLD, DM, GERD, depression, anxiety, UTI, PVD, panic attack, chronic pain syndrome on methadone, former smoker, HCV, liver cirrhosis, CKD4, DVT on eliquis    PT Comments    Pt seen for PT tx with pt long sitting in bed & able to come to sitting EOB with supervision, cuing & extra time. Pt participated in BLE strengthening exercises through available ROM with cuing to continue. Pt requires dependent assist to scoot to R along EOB & mod assist to return supine. Pt notes fatigue at end of session. Also attempted to perform PROM to BLE ankles but pt c/o pain & no improvement noted so activity stopped. Continue to recommend STR upon d/c to maximize independence with functional mobility & reduce fall risk prior to return home.    Recommendations for follow up therapy are one component of a multi-disciplinary discharge planning process, led by the attending physician.  Recommendations may be updated based on patient status, additional functional criteria and insurance authorization.  Follow Up Recommendations  Skilled nursing-short term rehab (<3 hours/day)     Assistance Recommended at Discharge Frequent or constant Supervision/Assistance  Patient can return home with the following Two people to help with walking and/or transfers;Two people to help with bathing/dressing/bathroom;Assistance with feeding;Assist for transportation   Equipment Recommendations  None recommended by PT    Recommendations for Other Services       Precautions / Restrictions Precautions Precautions: Fall Restrictions Weight Bearing Restrictions: No     Mobility  Bed Mobility Overal bed mobility:  Needs Assistance Bed Mobility: Supine to Sit, Sit to Supine, Rolling Rolling: Min assist   Supine to sit: Supervision, HOB elevated Sit to supine: Mod assist   General bed mobility comments: total assist to scoot up in bed    Transfers                        Ambulation/Gait                   Stairs             Wheelchair Mobility    Modified Rankin (Stroke Patients Only)       Balance Overall balance assessment: Needs assistance Sitting-balance support: Feet unsupported, Feet supported, Bilateral upper extremity supported Sitting balance-Leahy Scale: Fair Sitting balance - Comments: supervision static sitting                                    Cognition Arousal/Alertness: Awake/alert Behavior During Therapy: WFL for tasks assessed/performed Overall Cognitive Status: No family/caregiver present to determine baseline cognitive functioning                                 General Comments: Pt only oriented to self (Name only). Per chart Pt is AxOx3 at baseline.        Exercises General Exercises - Lower Extremity Long Arc Quad: AROM, Strengthening, Both, 10 reps, Seated (verbal/tactile cuing)    General Comments        Pertinent Vitals/Pain Pain Assessment Pain Assessment:  Faces Faces Pain Scale: Hurts little more Pain Location: pain at site of catheter insertion with movement Pain Descriptors / Indicators: Discomfort, Grimacing Pain Intervention(s): Repositioned    Home Living                          Prior Function            PT Goals (current goals can now be found in the care plan section) Acute Rehab PT Goals Patient Stated Goal: none stated PT Goal Formulation: Patient unable to participate in goal setting Time For Goal Achievement: 09/16/21 Potential to Achieve Goals: Fair Progress towards PT goals: Progressing toward goals    Frequency    Min 2X/week      PT Plan  Current plan remains appropriate    Co-evaluation              AM-PAC PT "6 Clicks" Mobility   Outcome Measure  Help needed turning from your back to your side while in a flat bed without using bedrails?: A Little Help needed moving from lying on your back to sitting on the side of a flat bed without using bedrails?: A Lot Help needed moving to and from a bed to a chair (including a wheelchair)?: Total Help needed standing up from a chair using your arms (e.g., wheelchair or bedside chair)?: Total Help needed to walk in hospital room?: Total Help needed climbing 3-5 steps with a railing? : Total 6 Click Score: 9    End of Session   Activity Tolerance: Patient limited by fatigue Patient left: in bed;with call bell/phone within reach;with bed alarm set   PT Visit Diagnosis: Muscle weakness (generalized) (M62.81);Other abnormalities of gait and mobility (R26.89)     Time: 3716-9678 PT Time Calculation (min) (ACUTE ONLY): 10 min  Charges:  $Therapeutic Activity: 8-22 mins                     Lavone Nian, PT, DPT 09/04/21, 12:15 PM   Waunita Schooner 09/04/2021, 12:13 PM

## 2021-09-04 NOTE — TOC Progression Note (Signed)
Transition of Care [facility]) - Progression Note    Patient Details  Name: ALESIA OSHIELDS MRN: [id number] Date of Birth: 30-Oct-1950  Transition of Care [place]) CM/SW [place], RN Phone Number: 09/04/2021, 3:42 PM  Clinical Narrative:   Patient can be transferred to [facility] for continued [place] residence tomorrow as per MD and [name] at [facility].    Expected Discharge Plan: [place] ([facility]) Barriers to Discharge: Continued Medical Work up  Expected Discharge Plan and Services Expected Discharge Plan: [place] ([facility])   Discharge Planning Services: CM Consult   Living arrangements for the past 2 months: [place]                                       Social Determinants of Health (SDOH) Interventions    Readmission Risk Interventions Readmission Risk Prevention Plan 09/01/2021 07/07/2021 01/30/2019  Transportation Screening Complete Complete Complete  PCP or Specialist Appt within 3-5 Days - Complete -  Social Work Consult for [place] Planning/Counseling - Complete -  [place] - Not Applicable -  Medication Review Press photographer) - Complete Complete  HRI or [place] - - Complete  SW Recovery Care/Counseling Consult - - Complete  Palliative Care Screening - - Not [place] - - Complete  Some recent data might be hidden

## 2021-09-04 NOTE — Progress Notes (Signed)
PROGRESS NOTE    Yesenia Shepard  KPT:[id number] DOB: 03-Oct-1950 DOA: 08/31/2021 PCP: [name], [name], MD    Assessment & Plan:   Principal Problem:   UTI (urinary tract infection) Active Problems:   Anxiety and depression   Essential hypertension   HLD (hyperlipidemia)   Hepatic cirrhosis (HCC)   CKD (chronic kidney disease), stage IV (HCC)   Proctocolitis   Type II diabetes mellitus with renal manifestations (HCC)   Acute metabolic encephalopathy   PVD (peripheral vascular disease) (HCC)   Chronic pain   DVT (deep venous thrombosis) (HCC)     UTI: has hx of vancomycin resistant Enterococcus. Urine cx growing klebsiella pneumoniae. Abxs changed to po cipro & flagyl.   Proctocolitis: as per CT. Abxs changed to po cipro & flagyl. Blood cxs NGTD   Acute metabolic encephalopathy: etiology uncler. CT of head is negative for acute intracranial abnormalities.  Possibly due to UTI vs cirrhosis vs delirium vs dementia. MRI brain shows no acute intracranial process. Mental status is close to baseline   Memory deficits: no formal dx of dementia. Continue w/ supportive care   Depression: severity unknown. Continue on pristiq, abilify    HTN: continue on lisinopril.    HLD: continue on statin   Hepatic cirrhosis: likely secondary to HCV. Does not take any meds for this as per med rec    CKDIV: Cr is better than baseline    DM2: well controlled, HbA1c 5.9. Continue on glargine, SSI w/ accuchecks    PVD: continue on statin   Chronic pain:  continue on home dose of methadone    DVT: continue on eliquis     DVT prophylaxis: eliquis  Code Status: DNR Family Communication:  Disposition Plan: d/c back home facility, [facility]   Level of care: Med-Surg  Status is: Inpatient Remains inpatient appropriate because: medically stable for d/c, waiting on [facility] to accept the pt back     Consultants:    Procedures:   Antimicrobials: cipro, flagyl    Subjective: Pt c/o  malaise   Objective: Vitals:   09/03/21 0959 09/03/21 1601 09/03/21 2021 09/04/21 0508  BP: (!) 152/58 (!) 143/73 130/64 (!) 163/71  Pulse: 94 86 82 74  Resp: 18 15 16 16   Temp: 98.3 F (36.8 C) 98.5 F (36.9 C) 98.3 F (36.8 C) 98 F (36.7 C)  TempSrc: Oral  Oral Oral  SpO2: 97% 96% 96% 97%  Weight:      Height:        Intake/Output Summary (Last 24 hours) at 09/04/2021 0737 Last data filed at 09/04/2021 0508 Gross per 24 hour  Intake 176.39 ml  Output 1300 ml  Net -1123.61 ml   Filed Weights   08/31/21 1156 09/01/21 0230  Weight: 82 kg 68.6 kg    Examination:  General exam: Appears comfortable  Respiratory system: decreased breath sounds b/l  Cardiovascular system: S1/S2+. No rubs or gallops  Gastrointestinal system: Abd is soft, NT, ND & hypoactive bowel sounds   Central nervous system: awake and oriented x3. Moves all extremities  Psychiatry: judgement and insight appears poor. Flat mood and affect   Data Reviewed: I have personally reviewed following labs and imaging studies  CBC: Recent Labs  Lab 08/31/21 1201 09/02/21 0636 09/03/21 0522 09/04/21 0556  WBC 9.1 7.5 8.2 9.0  NEUTROABS 6.8  --   --   --   HGB 12.6 10.6* 10.0* 10.3*  HCT 39.9 33.2* 31.4* 32.5*  MCV 89.1 87.4 88.5 89.0  PLT 502* 361 302 010   Basic Metabolic Panel: Recent Labs  Lab 08/31/21 1201 09/01/21 0306 09/02/21 0636 09/03/21 0522  NA 137 138 138 135  K 3.7 3.1* 3.8 3.5  CL 100 103 103 104  CO2 28 27 24 24   GLUCOSE 132* 97 115* 121*  BUN 18 16 13 16   CREATININE 1.19* 0.90 0.85 0.97  CALCIUM 8.9 8.6* 8.7* 8.4*  MG  --   --  2.1 2.0   GFR: Estimated Creatinine Clearance: 51.7 mL/min (by C-G formula based on SCr of 0.97 mg/dL). Liver Function Tests: Recent Labs  Lab 08/31/21 1201 09/02/21 0636 09/03/21 0522  AST 17 14* 12*  ALT 12 9 10   ALKPHOS 73 64 51  BILITOT 0.7 0.9 0.6  PROT 7.5 6.2* 5.5*  ALBUMIN 3.7 3.1* 3.0*   Recent Labs  Lab 08/31/21 1201  LIPASE  25   Recent Labs  Lab 09/01/21 0306  AMMONIA <10   Coagulation Profile: Recent Labs  Lab 08/31/21 1836  INR 1.2   Cardiac Enzymes: No results for input(s): CKTOTAL, CKMB, CKMBINDEX, TROPONINI in the last 168 hours. BNP (last 3 results) No results for input(s): PROBNP in the last 8760 hours. HbA1C: No results for input(s): HGBA1C in the last 72 hours. CBG: Recent Labs  Lab 09/03/21 0819 09/03/21 0959 09/03/21 1214 09/03/21 1713 09/03/21 2023  GLUCAP 114* 166* 161* 134* 99   Lipid Profile: No results for input(s): CHOL, HDL, LDLCALC, TRIG, CHOLHDL, LDLDIRECT in the last 72 hours. Thyroid Function Tests: No results for input(s): TSH, T4TOTAL, FREET4, T3FREE, THYROIDAB in the last 72 hours. Anemia Panel: No results for input(s): VITAMINB12, FOLATE, FERRITIN, TIBC, IRON, RETICCTPCT in the last 72 hours. Sepsis Labs: No results for input(s): PROCALCITON, LATICACIDVEN in the last 168 hours.  Recent Results (from the past 240 hour(s))  Urine Culture     Status: Abnormal (Preliminary result)   Collection Time: 08/31/21 12:01 PM   Specimen: Urine, Clean Catch  Result Value Ref Range Status   Specimen Description   Final    URINE, CLEAN CATCH Performed at [facility], [address] [place], [place] [address]    Special Requests   Final    NONE Performed at [facility], [address], [place], [place] [address]    Culture (A)  Final    >=100,000 COLONIES/mL KLEBSIELLA PNEUMONIAE SUSCEPTIBILITIES TO FOLLOW Performed at [facility], [place] [address], [place], [place] [address]    Report Status PENDING  Incomplete  Resp Panel by RT-PCR (Flu A&B, Covid) Nasopharyngeal Swab     Status: None   Collection Time: 08/31/21  4:00 PM   Specimen: Nasopharyngeal Swab; Nasopharyngeal(NP) swabs in vial transport medium  Result Value Ref Range Status   SARS Coronavirus 2 by RT PCR NEGATIVE NEGATIVE Final    Comment: (NOTE) SARS-CoV-2 target nucleic  acids are NOT DETECTED.  The SARS-CoV-2 RNA is generally detectable in upper respiratory specimens during the acute phase of infection. The lowest concentration of SARS-CoV-2 viral copies this assay can detect is 138 copies/mL. A negative result does not preclude SARS-Cov-2 infection and should not be used as the sole basis for treatment or other patient management decisions. A negative result may occur with  improper specimen collection/handling, submission of specimen other than nasopharyngeal swab, presence of viral mutation(s) within the areas targeted by this assay, and inadequate number of viral copies(<138 copies/mL). A negative result must be combined with clinical observations, patient history, and epidemiological information.  The expected result is Negative.  Fact Sheet for Patients:  EntrepreneurPulse.com.au  Fact Sheet for Healthcare Providers:  IncredibleEmployment.be  This test is no t yet approved or cleared by the [place] FDA and  has been authorized for detection and/or diagnosis of SARS-CoV-2 by FDA under an Emergency Use Authorization (EUA). This EUA will remain  in effect (meaning this test can be used) for the duration of the COVID-19 declaration under Section 564(b)(1) of the Act, 21 U.S.C.section 360bbb-3(b)(1), unless the authorization is terminated  or revoked sooner.       Influenza A by PCR NEGATIVE NEGATIVE Final   Influenza B by PCR NEGATIVE NEGATIVE Final    Comment: (NOTE) The Xpert Xpress SARS-CoV-2/FLU/RSV plus assay is intended as an aid in the diagnosis of influenza from Nasopharyngeal swab specimens and should not be used as a sole basis for treatment. Nasal washings and aspirates are unacceptable for Xpert Xpress SARS-CoV-2/FLU/RSV testing.  Fact Sheet for Patients: EntrepreneurPulse.com.au  Fact Sheet for Healthcare Providers: IncredibleEmployment.be  This  test is not yet approved or cleared by the [place] FDA and has been authorized for detection and/or diagnosis of SARS-CoV-2 by FDA under an Emergency Use Authorization (EUA). This EUA will remain in effect (meaning this test can be used) for the duration of the COVID-19 declaration under Section 564(b)(1) of the Act, 21 U.S.C. section 360bbb-3(b)(1), unless the authorization is terminated or revoked.  Performed at [facility], [address] [place], [place] [address]   CULTURE, BLOOD (ROUTINE X 2) w Reflex to ID Panel     Status: None (Preliminary result)   Collection Time: 08/31/21  6:36 PM   Specimen: BLOOD  Result Value Ref Range Status   Specimen Description BLOOD RIGHT FOREARM  Final   Special Requests   Final    BOTTLES DRAWN AEROBIC AND ANAEROBIC Blood Culture adequate volume   Culture   Final    NO GROWTH 4 DAYS Performed at [facility], [address], [place], [place] [address]    Report Status PENDING  Incomplete  CULTURE, BLOOD (ROUTINE X 2) w Reflex to ID Panel     Status: None (Preliminary result)   Collection Time: 08/31/21  6:36 PM   Specimen: BLOOD  Result Value Ref Range Status   Specimen Description BLOOD LEFT ASSIST CONTROL  Final   Special Requests   Final    BOTTLES DRAWN AEROBIC AND ANAEROBIC Blood Culture adequate volume   Culture   Final    NO GROWTH 4 DAYS Performed at [facility], [address], [place], [place] [address]    Report Status PENDING  Incomplete         Radiology Studies: No results found.      Scheduled Meds:  apixaban  5 mg Oral BID   ARIPiprazole  10 mg Oral QHS   vitamin C  500 mg Oral BID   atorvastatin  40 mg Oral QPM   Chlorhexidine Gluconate Cloth  6 each Topical Daily   cholecalciferol  1,000 Units Oral Daily   famotidine  20 mg Oral Daily   feeding supplement (GLUCERNA SHAKE)  237 mL Oral BID BM   ferrous sulfate  325 mg Oral BID   gabapentin  100 mg Oral Q12H    insulin aspart  0-5 Units Subcutaneous QHS   insulin aspart  0-9 Units Subcutaneous TID WC   insulin glargine-yfgn  7 Units Subcutaneous QHS   lisinopril  10 mg Oral Daily  methadone  25 mg Oral Daily   multivitamin with minerals  1 tablet Oral Daily   Ensure Max Protein  11 oz Oral QHS   senna-docusate  1 tablet Oral q AM   sodium chloride flush  10-40 mL Intracatheter Q12H   tamsulosin  0.4 mg Oral q AM   venlafaxine XR  37.5 mg Oral Q breakfast   zinc sulfate  220 mg Oral Daily   Continuous Infusions:  piperacillin-tazobactam (ZOSYN)  IV 3.375 g (09/04/21 0602)     LOS: 3 days    Time spent: 15 mins     Wyvonnia Dusky, MD [facility] Pager [phone]  If 7PM-7AM, please contact night-coverage 09/04/2021, 7:37 AM

## 2021-09-05 LAB — CBC
HCT: 31 % — ABNORMAL LOW (ref 36.0–46.0)
Hemoglobin: 9.9 g/dL — ABNORMAL LOW (ref 12.0–15.0)
MCH: 28 pg (ref 26.0–34.0)
MCHC: 31.9 g/dL (ref 30.0–36.0)
MCV: 87.6 fL (ref 80.0–100.0)
Platelets: 277 10*3/uL (ref 150–400)
RBC: 3.54 MIL/uL — ABNORMAL LOW (ref 3.87–5.11)
RDW: 13.5 % (ref 11.5–15.5)
WBC: 8.8 10*3/uL (ref 4.0–10.5)
nRBC: 0 % (ref 0.0–0.2)

## 2021-09-05 LAB — COMPREHENSIVE METABOLIC PANEL
ALT: 9 U/L (ref 0–44)
AST: 15 U/L (ref 15–41)
Albumin: 3.1 g/dL — ABNORMAL LOW (ref 3.5–5.0)
Alkaline Phosphatase: 52 U/L (ref 38–126)
Anion gap: 6 (ref 5–15)
BUN: 14 mg/dL (ref 8–23)
CO2: 29 mmol/L (ref 22–32)
Calcium: 9 mg/dL (ref 8.9–10.3)
Chloride: 103 mmol/L (ref 98–111)
Creatinine, Ser: 0.85 mg/dL (ref 0.44–1.00)
GFR, Estimated: >60 mL/min (ref >60)
Glucose, Bld: 108 mg/dL — ABNORMAL HIGH (ref 70–99)
Potassium: 3.4 mmol/L — ABNORMAL LOW (ref 3.5–5.1)
Sodium: 138 mmol/L (ref 135–145)
Total Bilirubin: 0.5 mg/dL (ref 0.3–1.2)
Total Protein: 6.1 g/dL — ABNORMAL LOW (ref 6.5–8.1)

## 2021-09-05 LAB — MAGNESIUM: Magnesium: 1.9 mg/dL (ref 1.7–2.4)

## 2021-09-05 LAB — RESP PANEL BY RT-PCR (FLU A&B, COVID) ARPGX2
Influenza A by PCR: NEGATIVE
Influenza B by PCR: NEGATIVE
SARS Coronavirus 2 by RT PCR: NEGATIVE

## 2021-09-05 LAB — GLUCOSE, CAPILLARY
Glucose-Capillary: 111 mg/dL — ABNORMAL HIGH (ref 70–99)
Glucose-Capillary: 134 mg/dL — ABNORMAL HIGH (ref 70–99)

## 2021-09-05 MED ORDER — CIPROFLOXACIN HCL 500 MG PO TABS: 500.0000 mg | ORAL_TABLET | Freq: Two times a day (BID) | ORAL | 0 refills | Status: AC

## 2021-09-05 MED ORDER — METRONIDAZOLE 500 MG PO TABS: 500.0000 mg | ORAL_TABLET | Freq: Two times a day (BID) | ORAL | 0 refills | Status: AC

## 2021-09-05 MED ORDER — POTASSIUM CHLORIDE CRYS ER 20 MEQ PO TBCR
20.0000 meq | EXTENDED_RELEASE_TABLET | Freq: Once | ORAL | Status: AC
  Administered 2021-09-05: 09:00:00 20 meq via ORAL
  Filled 2021-09-05: qty 1

## 2021-09-05 NOTE — Care Management Important Message (Signed)
Important Message  Patient Details  Name: Yesenia Shepard MRN: [id number] Date of Birth: June 06, 1951   Medicare Important Message Given:  Yes  I reviewed the Important Message from Medicare with the patient's [name]    Juliann Pulse A [name] 09/05/2021, 11:03 AM

## 2021-09-05 NOTE — Progress Notes (Signed)
Attempt made at calling report to [facility]. No answer.

## 2021-09-05 NOTE — Discharge Summary (Signed)
Physician Discharge Summary  Yesenia Shepard CNO:[id number] DOB: 08/12/1950 DOA: 08/31/2021  PCP: [name], [name], MD  Admit date: 08/31/2021 Discharge date: 09/05/2021  Admitted From: [facility] Disposition:  [facility]   Recommendations for Outpatient Follow-up:  Follow up with PCP in 1-2 weeks   Home Health: no  Equipment/Devices:  Discharge Condition: stable  CODE STATUS: DNR  Diet recommendation: Dysphagia II: Heart Healthy / Carb Modified   Brief/Interim Summary: HPI was taken from Dr. Blaine Hamper: Yesenia Shepard is a 71 y.o. female with medical history significant of hypertension, hyperlipidemia, diabetes mellitus, GERD, depression with anxiety, UTI, PVD, panic attack, chronic pain syndrome on methadone currently, former smoker, HCV, liver cirrhosis, CKD-4, DVT on Eliquis, who presents with altered mental status.   Patient has AMS,  and is unable to provide accurate medical history, therefore, most of the history is obtained by discussing the case with ED physician, per EMS report, and with the nursing staff.   Per report, and a normal baseline, patient is orientated x3.  In the past 3 days, patient was found to be confused.  Patient is currently taking Linezolid for UTI.  No active nausea, vomiting, diarrhea noted.  No cough, respiratory distress or shortness of breath noted.  Patient does not seem to have chest pain.  Patient seems to have lower abdominal tenderness on palpation.  Not sure if patient has symptoms of UTI.  No hematuria noted.  When I saw patient in the ED, she knows her own name, but is not orientated to time and place. She moves all extremities.     Data Reviewed and ED Course: pt was found to have positive urinalysis (yellow appearance, large amount of leukocyte, positive nitrite, many bacteria, WBC > 50), pending COVID PCR, stable renal function, WBC 9.1, temperature normal, blood pressure 148/84, heart rate 85, RR 17, oxygen saturation 99% on room air.  CT of head is negative for  acute intracranial abnormalities.     X-ray: Left upper lobe nodular density may be related to EKG clip. Follow-up chest x-ray recommended to exclude underlying lesion. Otherwise lungs are clear.   CT-abd/pelvis: 1. Inflammatory changes of the distal sigmoid colon and rectum, consistent with proctocolitis. 2. Mild bilateral hydronephrosis, right-greater-than-left. Urinary bladder is decompressed by a Foley catheter. 3. Nonobstructive right nephrolithiasis. 4. Age-indeterminate compression fracture of the L1 vertebral body superior endplate. Recommend correlation for point tenderness. If clinically indicated, MRI of the lumbar spine could be performed to help determine acuity. 5.  Aortic Atherosclerosis (ICD10-I70.0).   Hospital course as per Dr. Jimmye Norman 2/24-2/28/23: Pt presented w/ altered mental status likely secondary to UTI. Pt was treated initially treated w/ IV zosyn and then switch to po abxs prior to d/c. Pt was also found to have proctocolitis on admission and was treated w/ IV zosyn. Pt's mental status returned to baseline prior to d/c.    Discharge Diagnoses:  Principal Problem:   UTI (urinary tract infection) Active Problems:   Anxiety and depression   Essential hypertension   HLD (hyperlipidemia)   Hepatic cirrhosis (HCC)   CKD (chronic kidney disease), stage IV (HCC)   Proctocolitis   Type II diabetes mellitus with renal manifestations (HCC)   Acute metabolic encephalopathy   PVD (peripheral vascular disease) (HCC)   Chronic pain   DVT (deep venous thrombosis) (HCC)  UTI: has hx of vancomycin resistant Enterococcus. Urine cx growing klebsiella pneumoniae. Continue on cipro & flagyl x 2 days more.   Proctocolitis: as per CT. Continue on cipro &  flagyl x 2 days more. Blood cxs NGTD   Acute metabolic encephalopathy: etiology uncler. CT of head is negative for acute intracranial abnormalities.  Possibly due to UTI vs cirrhosis vs delirium vs dementia. MRI brain  shows no acute intracranial process. Mental status is back to baseline    Memory deficits: no formal dx of dementia. Continue w/ supportive care    Depression: severity unknown. Continue on pristiq, abilify    HTN: continue on lisinopril.    HLD: continue on statin    Hepatic cirrhosis: likely secondary to HCV. Does not take any meds for this as per med rec    CKDIV: Cr is better than baseline    DM2: well controlled, HbA1c 5.9. Restart home anti-DM meds at d/c    PVD: continue on statin    Chronic pain:  continue on home dose of methadone    DVT: continue on eliquis   Discharge Instructions  Discharge Instructions     Diet - low sodium heart healthy   Complete by: As directed    Dysphagia II diet: heart healthy & carb modified   Discharge instructions   Complete by: As directed    F/u w/ PCP in 1-2 weeks.   Increase activity slowly   Complete by: As directed    No wound care   Complete by: As directed       Allergies as of 09/05/2021   No Known Allergies      Medication List     STOP taking these medications    ALPRAZolam 2 MG 24 hr tablet Commonly known as: XANAX XR   clopidogrel 75 MG tablet Commonly known as: PLAVIX   linezolid 600 MG tablet Commonly known as: ZYVOX   traZODone 50 MG tablet Commonly known as: DESYREL       TAKE these medications    acetaminophen 325 MG tablet Commonly known as: TYLENOL Take 2 tablets (650 mg total) by mouth every 6 (six) hours as needed for mild pain (or Fever >/= 101).   apixaban 5 MG Tabs tablet Commonly known as: ELIQUIS Take 5 mg by mouth 2 (two) times daily.   ARIPiprazole 10 MG tablet Commonly known as: ABILIFY Take 10 mg by mouth at bedtime.   atorvastatin 40 MG tablet Commonly known as: LIPITOR Take 1 tablet (40 mg total) by mouth daily. What changed: when to take this   cholecalciferol 25 MCG (1000 UNIT) tablet Commonly known as: VITAMIN D3 Take 1 tablet (1,000 Units total) by mouth  daily.   ciprofloxacin 500 MG tablet Commonly known as: CIPRO Take 1 tablet (500 mg total) by mouth 2 (two) times daily for 2 days.   desvenlafaxine 25 MG 24 hr tablet Commonly known as: PRISTIQ Take 25 mg by mouth in the morning.   famotidine 20 MG tablet Commonly known as: PEPCID Take 1 tablet (20 mg total) by mouth daily.   feeding supplement (GLUCERNA SHAKE) Liqd Take 237 mLs by mouth 2 (two) times daily between meals.   ferrous sulfate 325 (65 FE) MG tablet Take 325 mg by mouth 2 (two) times daily.   gabapentin 100 MG capsule Commonly known as: NEURONTIN Take 1 capsule (100 mg total) by mouth every 12 (twelve) hours.   insulin detemir 100 UNIT/ML FlexPen Commonly known as: LEVEMIR Inject 10 Units into the skin in the morning.   insulin lispro 100 UNIT/ML KwikPen Commonly known as: HUMALOG Inject 0-12 Units into the skin See admin instructions. Inject subcutaneously before meals per sliding  scale. If BS 100-150; 2 units, 201-250; 4 units, 251-300; 6 units, 301-350; 8 units, 351-400; 10 units, 401-450; 12 units. If greater than 450 call MD.   lisinopril 10 MG tablet Commonly known as: ZESTRIL Take 10 mg by mouth daily.   melatonin 5 MG Tabs Take 5 mg by mouth at bedtime.   methadone 10 MG/ML solution Commonly known as: DOLOPHINE Take 25 mg by mouth in the morning.   metroNIDAZOLE 500 MG tablet Commonly known as: FLAGYL Take 1 tablet (500 mg total) by mouth 2 (two) times daily for 2 days.   MIRALAX PO Take 17 g by mouth daily. Mix in 4 to 8 oz of fluid of choice.   senna-docusate 8.6-50 MG tablet Commonly known as: Senokot-S Take 1 tablet by mouth in the morning.   tamsulosin 0.4 MG Caps capsule Commonly known as: FLOMAX Take 0.4 mg by mouth in the morning.        No Known Allergies  Consultations:    Procedures/Studies: CT HEAD WO CONTRAST (5MM)  Result Date: 08/31/2021 CLINICAL DATA:  Altered mental status EXAM: CT HEAD WITHOUT CONTRAST  TECHNIQUE: Contiguous axial images were obtained from the base of the skull through the vertex without intravenous contrast. RADIATION DOSE REDUCTION: This exam was performed according to the departmental dose-optimization program which includes automated exposure control, adjustment of the mA and/or kV according to patient size and/or use of iterative reconstruction technique. COMPARISON:  07/22/2021 FINDINGS: Brain: No acute intracranial findings are seen. There are no signs of bleeding. Cortical sulci are prominent. There is decreased density in subcortical and periventricular white matter. Vascular: There are scattered arterial calcifications. Skull: Unremarkable. Sinuses/Orbits: Unremarkable. Other: None IMPRESSION: No acute intracranial findings are seen in noncontrast CT brain. Atrophy. Small-vessel disease. Electronically Signed   By: Elmer Picker M.D.   On: 08/31/2021 13:20   MR BRAIN WO CONTRAST  Result Date: 09/01/2021 CLINICAL DATA:  Mental status change, unknown cause EXAM: MRI HEAD WITHOUT CONTRAST TECHNIQUE: Multiplanar, multiecho pulse sequences of the brain and surrounding structures were obtained without intravenous contrast. COMPARISON:  No prior MRI, correlation is made with CT head 08/31/2021 FINDINGS: Evaluation is somewhat limited by motion artifact. Brain: No restricted diffusion to suggest acute or subacute infarct. No acute hemorrhage, mass, mass effect, or midline shift. No hydrocephalus or extra-axial collection. T2 hyperintense signal in the periventricular white matter, likely the sequela of mild chronic small vessel ischemic disease. Mildly advanced central atrophy for age. Vascular: Normal flow voids. Skull and upper cervical spine: Normal marrow signal. Sinuses/Orbits: No acute finding.  Left lens replacement. Other: Trace fluid in right mastoid air cells. IMPRESSION: No acute intracranial process. No etiology is seen for the patient's mental status change. Electronically  Signed   By: Merilyn Baba M.D.   On: 09/01/2021 23:44   CT ABDOMEN PELVIS W CONTRAST  Result Date: 08/31/2021 CLINICAL DATA:  Abdominal pain.  Altered mental status. EXAM: CT ABDOMEN AND PELVIS WITH CONTRAST TECHNIQUE: Multidetector CT imaging of the abdomen and pelvis was performed using the standard protocol following bolus administration of intravenous contrast. RADIATION DOSE REDUCTION: This exam was performed according to the departmental dose-optimization program which includes automated exposure control, adjustment of the mA and/or kV according to patient size and/or use of iterative reconstruction technique. CONTRAST:  160mL OMNIPAQUE IOHEXOL 300 MG/ML  SOLN COMPARISON:  None. FINDINGS: Lower chest: No acute abnormality. Left anterior descending coronary artery calcifications. Hepatobiliary: No focal liver abnormality is seen. Status post cholecystectomy. No biliary dilatation.  Pancreas: Diffuse severe parenchymal atrophy with fatty infiltration. No pancreatic ductal dilatation or surrounding inflammatory changes. Spleen: Normal in size without focal abnormality. Adrenals/Urinary Tract: Adrenal glands are unremarkable. A 3.6 cm exophytic cortical simple cyst is noted at the middle third of the left kidney (series 2, image 38). Punctate nonobstructive calyceal stone is noted at the lower pole of the right kidney (series 2, image 31). Mild bilateral hydronephrosis. Urinary bladder is decompressed with a Foley catheter. Stomach/Bowel: Stomach is within normal limits. The appendix is not directly visualized, however there are no pericecal inflammatory changes. Diffuse wall thickening and mucosal hyperemia of the distal sigmoid colon and rectum (series 2, images 67-76). No pneumatosis. Vascular/Lymphatic: Aortic atherosclerosis. Vascular stent graft within the left common iliac artery. No enlarged abdominal or pelvic lymph nodes. Reproductive: Status post hysterectomy. No adnexal masses. Other: No abdominal  wall hernia or abnormality. No abdominopelvic ascites. Musculoskeletal: Age indeterminate compression fracture of the L1 vertebral body superior endplate with approximately 25% vertebral body height loss. Anterior wedging of the T8 vertebral body. IMPRESSION: 1. Inflammatory changes of the distal sigmoid colon and rectum, consistent with proctocolitis. 2. Mild bilateral hydronephrosis, right-greater-than-left. Urinary bladder is decompressed by a Foley catheter. 3. Nonobstructive right nephrolithiasis. 4. Age-indeterminate compression fracture of the L1 vertebral body superior endplate. Recommend correlation for point tenderness. If clinically indicated, MRI of the lumbar spine could be performed to help determine acuity. 5.  Aortic Atherosclerosis (ICD10-I70.0). Electronically Signed   By: Ileana Roup M.D.   On: 08/31/2021 13:37   DG Chest Portable 1 View  Result Date: 08/31/2021 CLINICAL DATA:  Short of breath EXAM: PORTABLE CHEST 1 VIEW COMPARISON:  07/07/2021 FINDINGS: Heart size and vascularity normal. Right lung is clear. No effusion Question soft tissue nodule left upper lobe. This may be related to an adjacent EKG monitoring clip. No lesion is seen in this area on the prior study. IMPRESSION: Left upper lobe nodular density may be related to EKG clip. Follow-up chest x-ray recommended to exclude underlying lesion. Otherwise lungs are clear. Electronically Signed   By: Franchot Gallo M.D.   On: 08/31/2021 12:51   [place] EKG SITE RITE  Result Date: 08/31/2021 If [facility] image not attached, placement could not be confirmed due to current cardiac rhythm.  (Echo, Carotid, EGD, Colonoscopy, ERCP)    Subjective: Pt denies any complaints    Discharge Exam: Vitals:   09/05/21 0435 09/05/21 0846  BP: (!) 152/61 (!) 162/78  Pulse: 80 79  Resp: 16 18  Temp: 98.3 F (36.8 C) 98 F (36.7 C)  SpO2: 99% 98%   Vitals:   09/04/21 1453 09/04/21 2030 09/05/21 0435 09/05/21 0846  BP: (!) 149/69 140/68  (!) 152/61 (!) 162/78  Pulse: 91 95 80 79  Resp: 18 16 16 18   Temp: 97.9 F (36.6 C) 98.1 F (36.7 C) 98.3 F (36.8 C) 98 F (36.7 C)  TempSrc: Oral  Oral Oral  SpO2:  96% 99% 98%  Weight:      Height:        General: Pt is alert, awake, not in acute distress Cardiovascular:  S1/S2 +, no rubs, no gallops Respiratory: CTA bilaterally, no wheezing, no rhonchi Abdominal: Soft, NT, ND, bowel sounds + Extremities: no edema, no cyanosis    The results of significant diagnostics from this hospitalization (including imaging, microbiology, ancillary and laboratory) are listed below for reference.     Microbiology: Recent Results (from the past 240 hour(s))  Urine Culture  Status: Abnormal   Collection Time: 08/31/21 12:01 PM   Specimen: Urine, Clean Catch  Result Value Ref Range Status   Specimen Description   Final    URINE, CLEAN CATCH Performed at [facility], [address], [place], [place] [address]    Special Requests   Final    NONE Performed at [facility], [place], [place] [address]    Culture >=100,000 COLONIES/mL KLEBSIELLA PNEUMONIAE (A)  Final   Report Status 09/04/2021 FINAL  Final   Organism ID, Bacteria KLEBSIELLA PNEUMONIAE (A)  Final      Susceptibility   Klebsiella pneumoniae - MIC*    AMPICILLIN >=32 RESISTANT Resistant     CEFAZOLIN <=4 SENSITIVE Sensitive     CEFEPIME <=0.12 SENSITIVE Sensitive     CEFTRIAXONE <=0.25 SENSITIVE Sensitive     CIPROFLOXACIN <=0.25 SENSITIVE Sensitive     GENTAMICIN <=1 SENSITIVE Sensitive     IMIPENEM <=0.25 SENSITIVE Sensitive     NITROFURANTOIN 128 RESISTANT Resistant     TRIMETH/SULFA <=20 SENSITIVE Sensitive     AMPICILLIN/SULBACTAM >=32 RESISTANT Resistant     PIP/TAZO 16 SENSITIVE Sensitive     * >=100,000 COLONIES/mL KLEBSIELLA PNEUMONIAE  Resp Panel by RT-PCR (Flu A&B, Covid) Nasopharyngeal Swab     Status: None   Collection Time: 08/31/21  4:00 PM   Specimen:  Nasopharyngeal Swab; Nasopharyngeal(NP) swabs in vial transport medium  Result Value Ref Range Status   SARS Coronavirus 2 by RT PCR NEGATIVE NEGATIVE Final    Comment: (NOTE) SARS-CoV-2 target nucleic acids are NOT DETECTED.  The SARS-CoV-2 RNA is generally detectable in upper respiratory specimens during the acute phase of infection. The lowest concentration of SARS-CoV-2 viral copies this assay can detect is 138 copies/mL. A negative result does not preclude SARS-Cov-2 infection and should not be used as the sole basis for treatment or other patient management decisions. A negative result may occur with  improper specimen collection/handling, submission of specimen other than nasopharyngeal swab, presence of viral mutation(s) within the areas targeted by this assay, and inadequate number of viral copies(<138 copies/mL). A negative result must be combined with clinical observations, patient history, and epidemiological information. The expected result is Negative.  Fact Sheet for Patients:  EntrepreneurPulse.com.au  Fact Sheet for Healthcare Providers:  IncredibleEmployment.be  This test is no t yet approved or cleared by the [place] FDA and  has been authorized for detection and/or diagnosis of SARS-CoV-2 by FDA under an Emergency Use Authorization (EUA). This EUA will remain  in effect (meaning this test can be used) for the duration of the COVID-19 declaration under Section 564(b)(1) of the Act, 21 U.S.C.section 360bbb-3(b)(1), unless the authorization is terminated  or revoked sooner.       Influenza A by PCR NEGATIVE NEGATIVE Final   Influenza B by PCR NEGATIVE NEGATIVE Final    Comment: (NOTE) The Xpert Xpress SARS-CoV-2/FLU/RSV plus assay is intended as an aid in the diagnosis of influenza from Nasopharyngeal swab specimens and should not be used as a sole basis for treatment. Nasal washings and aspirates are unacceptable for  Xpert Xpress SARS-CoV-2/FLU/RSV testing.  Fact Sheet for Patients: EntrepreneurPulse.com.au  Fact Sheet for Healthcare Providers: IncredibleEmployment.be  This test is not yet approved or cleared by the [place] FDA and has been authorized for detection and/or diagnosis of SARS-CoV-2 by FDA under an Emergency Use Authorization (EUA). This EUA will remain in effect (meaning this test can be used) for the duration of the  COVID-19 declaration under Section 564(b)(1) of the Act, 21 U.S.C. section 360bbb-3(b)(1), unless the authorization is terminated or revoked.  Performed at [facility], [address] [place], [place] [address]   CULTURE, BLOOD (ROUTINE X 2) w Reflex to ID Panel     Status: None (Preliminary result)   Collection Time: 08/31/21  6:36 PM   Specimen: BLOOD  Result Value Ref Range Status   Specimen Description BLOOD RIGHT FOREARM  Final   Special Requests   Final    BOTTLES DRAWN AEROBIC AND ANAEROBIC Blood Culture adequate volume   Culture   Final    NO GROWTH 4 DAYS Performed at [facility], [address] [place], [place] [address]    Report Status PENDING  Incomplete  CULTURE, BLOOD (ROUTINE X 2) w Reflex to ID Panel     Status: None (Preliminary result)   Collection Time: 08/31/21  6:36 PM   Specimen: BLOOD  Result Value Ref Range Status   Specimen Description BLOOD LEFT ASSIST CONTROL  Final   Special Requests   Final    BOTTLES DRAWN AEROBIC AND ANAEROBIC Blood Culture adequate volume   Culture   Final    NO GROWTH 4 DAYS Performed at [facility], [address], [place], [place] [address]    Report Status PENDING  Incomplete  Resp Panel by RT-PCR (Flu A&B, Covid) Nasopharyngeal Swab     Status: None   Collection Time: 09/05/21 10:49 AM   Specimen: Nasopharyngeal Swab; Nasopharyngeal(NP) swabs in vial transport medium  Result Value Ref Range Status   SARS Coronavirus 2 by RT  PCR NEGATIVE NEGATIVE Final    Comment: (NOTE) SARS-CoV-2 target nucleic acids are NOT DETECTED.  The SARS-CoV-2 RNA is generally detectable in upper respiratory specimens during the acute phase of infection. The lowest concentration of SARS-CoV-2 viral copies this assay can detect is 138 copies/mL. A negative result does not preclude SARS-Cov-2 infection and should not be used as the sole basis for treatment or other patient management decisions. A negative result may occur with  improper specimen collection/handling, submission of specimen other than nasopharyngeal swab, presence of viral mutation(s) within the areas targeted by this assay, and inadequate number of viral copies(<138 copies/mL). A negative result must be combined with clinical observations, patient history, and epidemiological information. The expected result is Negative.  Fact Sheet for Patients:  EntrepreneurPulse.com.au  Fact Sheet for Healthcare Providers:  IncredibleEmployment.be  This test is no t yet approved or cleared by the [place] FDA and  has been authorized for detection and/or diagnosis of SARS-CoV-2 by FDA under an Emergency Use Authorization (EUA). This EUA will remain  in effect (meaning this test can be used) for the duration of the COVID-19 declaration under Section 564(b)(1) of the Act, 21 U.S.C.section 360bbb-3(b)(1), unless the authorization is terminated  or revoked sooner.       Influenza A by PCR NEGATIVE NEGATIVE Final   Influenza B by PCR NEGATIVE NEGATIVE Final    Comment: (NOTE) The Xpert Xpress SARS-CoV-2/FLU/RSV plus assay is intended as an aid in the diagnosis of influenza from Nasopharyngeal swab specimens and should not be used as a sole basis for treatment. Nasal washings and aspirates are unacceptable for Xpert Xpress SARS-CoV-2/FLU/RSV testing.  Fact Sheet for Patients: EntrepreneurPulse.com.au  Fact Sheet for  Healthcare Providers: IncredibleEmployment.be  This test is not yet approved or cleared by the [place] FDA and has been authorized for detection and/or diagnosis of SARS-CoV-2 by FDA under  an Emergency Use Authorization (EUA). This EUA will remain in effect (meaning this test can be used) for the duration of the COVID-19 declaration under Section 564(b)(1) of the Act, 21 U.S.C. section 360bbb-3(b)(1), unless the authorization is terminated or revoked.  Performed at [facility], [address] [place], [place] [address]      Labs: BNP (last 3 results) Recent Labs    07/06/21 0136  BNP 02.4   Basic Metabolic Panel: Recent Labs  Lab 09/01/21 0306 09/02/21 0636 09/03/21 0522 09/04/21 0556 09/05/21 0608  NA 138 138 135 140 138  K 3.1* 3.8 3.5 3.4* 3.4*  CL 103 103 104 103 103  CO2 27 24 24 27 29   GLUCOSE 97 115* 121* 105* 108*  BUN 16 13 16 11 14   CREATININE 0.90 0.85 0.97 0.94 0.85  CALCIUM 8.6* 8.7* 8.4* 8.6* 9.0  MG  --  2.1 2.0 1.8 1.9   Liver Function Tests: Recent Labs  Lab 08/31/21 1201 09/02/21 0636 09/03/21 0522 09/04/21 0556 09/05/21 0608  AST 17 14* 12* 15 15  ALT 12 9 10 9 9   ALKPHOS 73 64 51 54 52  BILITOT 0.7 0.9 0.6 0.6 0.5  PROT 7.5 6.2* 5.5* 6.0* 6.1*  ALBUMIN 3.7 3.1* 3.0* 3.1* 3.1*   Recent Labs  Lab 08/31/21 1201  LIPASE 25   Recent Labs  Lab 09/01/21 0306  AMMONIA <10   CBC: Recent Labs  Lab 08/31/21 1201 09/02/21 0636 09/03/21 0522 09/04/21 0556 09/05/21 0608  WBC 9.1 7.5 8.2 9.0 8.8  NEUTROABS 6.8  --   --   --   --   HGB 12.6 10.6* 10.0* 10.3* 9.9*  HCT 39.9 33.2* 31.4* 32.5* 31.0*  MCV 89.1 87.4 88.5 89.0 87.6  PLT 502* 361 302 310 277   Cardiac Enzymes: No results for input(s): CKTOTAL, CKMB, CKMBINDEX, TROPONINI in the last 168 hours. BNP: Invalid input(s): POCBNP CBG: Recent Labs  Lab 09/04/21 0753 09/04/21 1155 09/04/21 1533 09/04/21 2201 09/05/21 0802  GLUCAP 115*  116* 175* 152* 111*   D-Dimer No results for input(s): DDIMER in the last 72 hours. Hgb A1c No results for input(s): HGBA1C in the last 72 hours. Lipid Profile No results for input(s): CHOL, HDL, LDLCALC, TRIG, CHOLHDL, LDLDIRECT in the last 72 hours. Thyroid function studies No results for input(s): TSH, T4TOTAL, T3FREE, THYROIDAB in the last 72 hours.  Invalid input(s): FREET3 Anemia work up No results for input(s): VITAMINB12, FOLATE, FERRITIN, TIBC, IRON, RETICCTPCT in the last 72 hours. Urinalysis    Component Value Date/Time   COLORURINE YELLOW (A) 08/31/2021 1201   APPEARANCEUR HAZY (A) 08/31/2021 1201   LABSPEC 1.020 08/31/2021 1201   PHURINE 5.0 08/31/2021 1201   GLUCOSEU NEGATIVE 08/31/2021 1201   HGBUR MODERATE (A) 08/31/2021 1201   BILIRUBINUR NEGATIVE 08/31/2021 1201   BILIRUBINUR negative 01/14/2017 1603   KETONESUR 20 (A) 08/31/2021 1201   PROTEINUR 30 (A) 08/31/2021 1201   UROBILINOGEN 1.0 01/14/2017 1603   NITRITE POSITIVE (A) 08/31/2021 1201   LEUKOCYTESUR LARGE (A) 08/31/2021 1201   Sepsis Labs Invalid input(s): PROCALCITONIN,  WBC,  LACTICIDVEN Microbiology Recent Results (from the past 240 hour(s))  Urine Culture     Status: Abnormal   Collection Time: 08/31/21 12:01 PM   Specimen: Urine, Clean Catch  Result Value Ref Range Status   Specimen Description   Final    URINE, CLEAN CATCH Performed at [facility], [address] [place], [place] [address]    Special  Requests   Final    NONE Performed at [facility], [employer] [place], [place] [address]    Culture >=100,000 COLONIES/mL KLEBSIELLA PNEUMONIAE (A)  Final   Report Status 09/04/2021 FINAL  Final   Organism ID, Bacteria KLEBSIELLA PNEUMONIAE (A)  Final      Susceptibility   Klebsiella pneumoniae - MIC*    AMPICILLIN >=32 RESISTANT Resistant     CEFAZOLIN <=4 SENSITIVE Sensitive     CEFEPIME <=0.12 SENSITIVE Sensitive     CEFTRIAXONE <=0.25 SENSITIVE Sensitive      CIPROFLOXACIN <=0.25 SENSITIVE Sensitive     GENTAMICIN <=1 SENSITIVE Sensitive     IMIPENEM <=0.25 SENSITIVE Sensitive     NITROFURANTOIN 128 RESISTANT Resistant     TRIMETH/SULFA <=20 SENSITIVE Sensitive     AMPICILLIN/SULBACTAM >=32 RESISTANT Resistant     PIP/TAZO 16 SENSITIVE Sensitive     * >=100,000 COLONIES/mL KLEBSIELLA PNEUMONIAE  Resp Panel by RT-PCR (Flu A&B, Covid) Nasopharyngeal Swab     Status: None   Collection Time: 08/31/21  4:00 PM   Specimen: Nasopharyngeal Swab; Nasopharyngeal(NP) swabs in vial transport medium  Result Value Ref Range Status   SARS Coronavirus 2 by RT PCR NEGATIVE NEGATIVE Final    Comment: (NOTE) SARS-CoV-2 target nucleic acids are NOT DETECTED.  The SARS-CoV-2 RNA is generally detectable in upper respiratory specimens during the acute phase of infection. The lowest concentration of SARS-CoV-2 viral copies this assay can detect is 138 copies/mL. A negative result does not preclude SARS-Cov-2 infection and should not be used as the sole basis for treatment or other patient management decisions. A negative result may occur with  improper specimen collection/handling, submission of specimen other than nasopharyngeal swab, presence of viral mutation(s) within the areas targeted by this assay, and inadequate number of viral copies(<138 copies/mL). A negative result must be combined with clinical observations, patient history, and epidemiological information. The expected result is Negative.  Fact Sheet for Patients:  EntrepreneurPulse.com.au  Fact Sheet for Healthcare Providers:  IncredibleEmployment.be  This test is no t yet approved or cleared by the [place] FDA and  has been authorized for detection and/or diagnosis of SARS-CoV-2 by FDA under an Emergency Use Authorization (EUA). This EUA will remain  in effect (meaning this test can be used) for the duration of the COVID-19 declaration under  Section 564(b)(1) of the Act, 21 U.S.C.section 360bbb-3(b)(1), unless the authorization is terminated  or revoked sooner.       Influenza A by PCR NEGATIVE NEGATIVE Final   Influenza B by PCR NEGATIVE NEGATIVE Final    Comment: (NOTE) The Xpert Xpress SARS-CoV-2/FLU/RSV plus assay is intended as an aid in the diagnosis of influenza from Nasopharyngeal swab specimens and should not be used as a sole basis for treatment. Nasal washings and aspirates are unacceptable for Xpert Xpress SARS-CoV-2/FLU/RSV testing.  Fact Sheet for Patients: EntrepreneurPulse.com.au  Fact Sheet for Healthcare Providers: IncredibleEmployment.be  This test is not yet approved or cleared by the [place] FDA and has been authorized for detection and/or diagnosis of SARS-CoV-2 by FDA under an Emergency Use Authorization (EUA). This EUA will remain in effect (meaning this test can be used) for the duration of the COVID-19 declaration under Section 564(b)(1) of the Act, 21 U.S.C. section 360bbb-3(b)(1), unless the authorization is terminated or revoked.  Performed at [facility], [place], [place] [address]   CULTURE, BLOOD (ROUTINE X 2) w Reflex to ID Panel     Status: None (  Preliminary result)   Collection Time: 08/31/21  6:36 PM   Specimen: BLOOD  Result Value Ref Range Status   Specimen Description BLOOD RIGHT FOREARM  Final   Special Requests   Final    BOTTLES DRAWN AEROBIC AND ANAEROBIC Blood Culture adequate volume   Culture   Final    NO GROWTH 4 DAYS Performed at [facility], [address], [place], [place] [address]    Report Status PENDING  Incomplete  CULTURE, BLOOD (ROUTINE X 2) w Reflex to ID Panel     Status: None (Preliminary result)   Collection Time: 08/31/21  6:36 PM   Specimen: BLOOD  Result Value Ref Range Status   Specimen Description BLOOD LEFT ASSIST CONTROL  Final   Special Requests   Final     BOTTLES DRAWN AEROBIC AND ANAEROBIC Blood Culture adequate volume   Culture   Final    NO GROWTH 4 DAYS Performed at [facility], [address], [place], [place] [address]    Report Status PENDING  Incomplete  Resp Panel by RT-PCR (Flu A&B, Covid) Nasopharyngeal Swab     Status: None   Collection Time: 09/05/21 10:49 AM   Specimen: Nasopharyngeal Swab; Nasopharyngeal(NP) swabs in vial transport medium  Result Value Ref Range Status   SARS Coronavirus 2 by RT PCR NEGATIVE NEGATIVE Final    Comment: (NOTE) SARS-CoV-2 target nucleic acids are NOT DETECTED.  The SARS-CoV-2 RNA is generally detectable in upper respiratory specimens during the acute phase of infection. The lowest concentration of SARS-CoV-2 viral copies this assay can detect is 138 copies/mL. A negative result does not preclude SARS-Cov-2 infection and should not be used as the sole basis for treatment or other patient management decisions. A negative result may occur with  improper specimen collection/handling, submission of specimen other than nasopharyngeal swab, presence of viral mutation(s) within the areas targeted by this assay, and inadequate number of viral copies(<138 copies/mL). A negative result must be combined with clinical observations, patient history, and epidemiological information. The expected result is Negative.  Fact Sheet for Patients:  EntrepreneurPulse.com.au  Fact Sheet for Healthcare Providers:  IncredibleEmployment.be  This test is no t yet approved or cleared by the [place] FDA and  has been authorized for detection and/or diagnosis of SARS-CoV-2 by FDA under an Emergency Use Authorization (EUA). This EUA will remain  in effect (meaning this test can be used) for the duration of the COVID-19 declaration under Section 564(b)(1) of the Act, 21 U.S.C.section 360bbb-3(b)(1), unless the authorization is terminated  or revoked sooner.        Influenza A by PCR NEGATIVE NEGATIVE Final   Influenza B by PCR NEGATIVE NEGATIVE Final    Comment: (NOTE) The Xpert Xpress SARS-CoV-2/FLU/RSV plus assay is intended as an aid in the diagnosis of influenza from Nasopharyngeal swab specimens and should not be used as a sole basis for treatment. Nasal washings and aspirates are unacceptable for Xpert Xpress SARS-CoV-2/FLU/RSV testing.  Fact Sheet for Patients: EntrepreneurPulse.com.au  Fact Sheet for Healthcare Providers: IncredibleEmployment.be  This test is not yet approved or cleared by the [place] FDA and has been authorized for detection and/or diagnosis of SARS-CoV-2 by FDA under an Emergency Use Authorization (EUA). This EUA will remain in effect (meaning this test can be used) for the duration of the COVID-19 declaration under Section 564(b)(1) of the Act, 21 U.S.C. section 360bbb-3(b)(1), unless the authorization is terminated or revoked.  Performed at [facility], [address] [place], [place] [address]      Time coordinating discharge: Over 30 minutes  SIGNED:   Wyvonnia Dusky, MD  [facility] 09/05/2021, 12:09 PM Pager   If 7PM-7AM, please contact night-coverage

## 2021-09-05 NOTE — Progress Notes (Signed)
Patient discharged in stable condition to [facility] via EMS

## 2021-09-05 NOTE — TOC Progression Note (Addendum)
Transition of Care [facility]) - Progression Note    Patient Details  Name: Yesenia Shepard MRN: [id number] Date of Birth: 12/26/1950  Transition of Care [facility]) CM/SW [place], RN Phone Number: 09/05/2021, 10:39 AM  Clinical Narrative:   Planning for discharge to [facility] today, [name] can accept patient.  Care team aware, patient will need negative covid test prior to returning, COVID ordered.  Addendum 1221 hours.  Patient returning to [facility] ,4th on list COVID negative family, patient and facility aware.  Expected Discharge Plan: [place] ([facility]) Barriers to Discharge: Continued Medical Work up  Expected Discharge Plan and Services Expected Discharge Plan: [place] ([facility])   Discharge Planning Services: CM Consult   Living arrangements for the past 2 months: [place]                                       Social Determinants of Health (SDOH) Interventions    Readmission Risk Interventions Readmission Risk Prevention Plan 09/01/2021 07/07/2021 01/30/2019  Transportation Screening Complete Complete Complete  PCP or Specialist Appt within 3-5 Days - Complete -  Social Work Consult for [place] Planning/Counseling - Complete -  [place] - Not Applicable -  Medication Review Press photographer) - Complete Complete  HRI or [place] - - Complete  SW Recovery Care/Counseling Consult - - Complete  Palliative Care Screening - - Not [place] - - Complete  Some recent data might be hidden

## 2021-09-06 DIAGNOSIS — Z741 Need for assistance with personal care: Secondary | ICD-10-CM | POA: Diagnosis not present

## 2021-09-06 DIAGNOSIS — Z736 Limitation of activities due to disability: Secondary | ICD-10-CM | POA: Diagnosis not present

## 2021-09-06 DIAGNOSIS — N390 Urinary tract infection, site not specified: Secondary | ICD-10-CM | POA: Diagnosis not present

## 2021-09-06 DIAGNOSIS — F0150 Vascular dementia without behavioral disturbance: Secondary | ICD-10-CM | POA: Diagnosis not present

## 2021-09-06 DIAGNOSIS — F331 Major depressive disorder, recurrent, moderate: Secondary | ICD-10-CM | POA: Diagnosis not present

## 2021-09-06 DIAGNOSIS — R296 Repeated falls: Secondary | ICD-10-CM | POA: Diagnosis not present

## 2021-09-06 DIAGNOSIS — R2681 Unsteadiness on feet: Secondary | ICD-10-CM | POA: Diagnosis not present

## 2021-09-06 DIAGNOSIS — R41841 Cognitive communication deficit: Secondary | ICD-10-CM | POA: Diagnosis not present

## 2021-09-06 DIAGNOSIS — G4701 Insomnia due to medical condition: Secondary | ICD-10-CM | POA: Diagnosis not present

## 2021-09-06 DIAGNOSIS — F064 Anxiety disorder due to known physiological condition: Secondary | ICD-10-CM | POA: Diagnosis not present

## 2021-09-06 DIAGNOSIS — R4182 Altered mental status, unspecified: Secondary | ICD-10-CM | POA: Diagnosis not present

## 2021-09-06 DIAGNOSIS — R1311 Dysphagia, oral phase: Secondary | ICD-10-CM | POA: Diagnosis not present

## 2021-09-06 DIAGNOSIS — R419 Unspecified symptoms and signs involving cognitive functions and awareness: Secondary | ICD-10-CM | POA: Diagnosis not present

## 2021-09-06 DIAGNOSIS — K523 Indeterminate colitis: Secondary | ICD-10-CM | POA: Diagnosis not present

## 2021-09-06 DIAGNOSIS — R339 Retention of urine, unspecified: Secondary | ICD-10-CM | POA: Diagnosis not present

## 2021-09-06 DIAGNOSIS — G9341 Metabolic encephalopathy: Secondary | ICD-10-CM | POA: Diagnosis not present

## 2021-09-07 ENCOUNTER — Ambulatory Visit: Payer: Self-pay | Admitting: Urology

## 2021-09-07 DIAGNOSIS — K523 Indeterminate colitis: Secondary | ICD-10-CM | POA: Diagnosis not present

## 2021-09-07 DIAGNOSIS — R339 Retention of urine, unspecified: Secondary | ICD-10-CM | POA: Diagnosis not present

## 2021-09-07 DIAGNOSIS — M6281 Muscle weakness (generalized): Secondary | ICD-10-CM | POA: Diagnosis not present

## 2021-09-07 DIAGNOSIS — N390 Urinary tract infection, site not specified: Secondary | ICD-10-CM | POA: Diagnosis not present

## 2021-09-07 LAB — CULTURE, BLOOD (ROUTINE X 2)
Culture: NO GROWTH
Culture: NO GROWTH
Special Requests: ADEQUATE
Special Requests: ADEQUATE

## 2021-09-08 ENCOUNTER — Encounter: Payer: Self-pay | Admitting: Urology

## 2021-09-08 ENCOUNTER — Ambulatory Visit (INDEPENDENT_AMBULATORY_CARE_PROVIDER_SITE_OTHER): Payer: Medicare HMO | Admitting: Urology

## 2021-09-08 ENCOUNTER — Ambulatory Visit: Payer: Medicare HMO

## 2021-09-08 ENCOUNTER — Other Ambulatory Visit: Payer: Self-pay

## 2021-09-08 VITALS — BP 110/72 | HR 84 | Ht 65.0 in | Wt 154.0 lb

## 2021-09-08 DIAGNOSIS — N390 Urinary tract infection, site not specified: Secondary | ICD-10-CM | POA: Diagnosis not present

## 2021-09-08 DIAGNOSIS — K523 Indeterminate colitis: Secondary | ICD-10-CM | POA: Diagnosis not present

## 2021-09-08 DIAGNOSIS — N1330 Unspecified hydronephrosis: Secondary | ICD-10-CM | POA: Diagnosis not present

## 2021-09-08 DIAGNOSIS — R339 Retention of urine, unspecified: Secondary | ICD-10-CM

## 2021-09-08 DIAGNOSIS — R4182 Altered mental status, unspecified: Secondary | ICD-10-CM | POA: Diagnosis not present

## 2021-09-08 LAB — BLADDER SCAN AMB NON-IMAGING: Scan Result: 30

## 2021-09-08 NOTE — Progress Notes (Signed)
? ?09/08/2021 ?9:11 AM  ? ?Yesenia Shepard ?02/14/1951 ?[id number] ? ?Referring provider: [place], [facility] ?[address] [place],  [place] [address] ? ?Chief Complaint  ?Patient presents with  ? Urinary Retention  ? ? ?HPI: ?Yesenia Shepard is a 71 y.o. female who presents for follow-up of recent hospitalization. ? ?Admitted [facility] 08/31/2021 from [facility] with altered mental status ?Had an indwelling Foley at the time of admission.  Was being treated for a UTI and apparently had urinary retention ?She denied prior history of urinary retention or urologic problems ?CT abdomen pelvis showed mild bilateral hydronephrosis and age indeterminant compression fracture L1 vertebral body ?Treated for UTI with improvement and mental status.  Urine culture grew Klebsiella.  Blood cultures were negative ?Discharged 09/05/2021 back to [facility] ? ? ?PMH: ?Past Medical History:  ?Diagnosis Date  ? Anxiety   ? Arthritis   ? Chronic pain   ? Depression   ? Diabetes mellitus without complication ([place])   ? Drug abuse ([place])   ? History of polysubstance abuse; currently on methadone.  ? Hepatitis   ? History of Hep "C". treated and cured with Harvoni  ? History of chicken pox   ? Hypertension   ? Panic attacks   ? Peripheral vascular disease ([place])   ? stent in place.   ? UTI (urinary tract infection)   ? History of  ? ? ?Surgical History: ?Past Surgical History:  ?Procedure Laterality Date  ? ABDOMINAL HYSTERECTOMY  1989  ? AMPUTATION TOE Left 10/26/2018  ? Procedure: 5TH RAY RESCTION;  Surgeon: Albertine Patricia, DPM;  Location: [facility] ORS;  Service: Podiatry;  Laterality: Left;  ? APPLICATION OF WOUND VAC N/A 02/05/2019  ? Procedure: APPLICATION OF WOUND VAC POSS DEBRIDEMENT;  Surgeon: Fredirick Maudlin, MD;  Location: [facility] ORS;  Service: General;  Laterality: N/A;  ? APPLICATION OF WOUND VAC  02/03/2019  ? Procedure: APPLICATION OF WOUND VAC;  Surgeon: Fredirick Maudlin, MD;  Location: [facility] OR[facility];  Service: General;;  ? CHOLECYSTECTOMY  1987  ?  INTRACAPSULAR CATARACT EXTRACTION Left   ? IRRIGATION AND DEBRIDEMENT ABSCESS N/A 02/03/2019  ? Procedure: IRRIGATION AND DEBRIDEMENT SACRAL DECUBITUS;  Surgeon: Fredirick Maudlin, MD;  Location: [facility] OR[facility];  Service: General;  Laterality: N/A;  ? LOWER EXTREMITY ANGIOGRAPHY Left 12/17/2017  ? Procedure: LOWER EXTREMITY ANGIOGRAPHY;  Surgeon: Katha Cabal, MD;  Location: [place] [facility];  Service: Cardiovascular;  Laterality: Left;  ? LOWER EXTREMITY ANGIOGRAPHY Left 10/24/2018  ? Procedure: LOWER EXTREMITY ANGIOGRAPHY;  Surgeon: Algernon Huxley, MD;  Location: [place] [facility];  Service: Cardiovascular;  Laterality: Left;  ? PERIPHERAL VASCULAR CATHETERIZATION Left 05/04/2015  ? Procedure: Lower Extremity Angiography;  Surgeon: Katha Cabal, MD;  Location: [place] [facility];  Service: Cardiovascular;  Laterality: Left;  ? PERIPHERAL VASCULAR CATHETERIZATION Left 05/04/2015  ? Procedure: Lower Extremity Intervention;  Surgeon: Katha Cabal, MD;  Location: [place] [facility];  Service: Cardiovascular;  Laterality: Left;  ? SHOULDER ARTHROSCOPY WITH OPEN ROTATOR CUFF REPAIR Right 01/16/2017  ? Procedure: SHOULDER ARTHROSCOPY WITH OPEN ROTATOR CUFF REPAIR;  Surgeon: [place], MD;  Location: [facility] OR[facility];  Service: Orthopedics;  Laterality: Right;  ? TONSILLECTOMY AND ADENOIDECTOMY  1971  ? ? ?Home Medications:  ?Allergies as of 09/08/2021   ?No Known Allergies ?  ? ?  ?Medication List  ?  ? ?  ? Accurate as of September 08, 2021  9:11 AM. If you have any questions, ask your nurse or  doctor.  ?  ?  ? ?  ? ?acetaminophen 325 MG tablet ?Commonly known as: TYLENOL ?Take 2 tablets (650 mg total) by mouth every 6 (six) hours as needed for mild pain (or Fever >/= 101). ?  ?apixaban 5 MG Tabs tablet ?Commonly known as: ELIQUIS ?Take 5 mg by mouth 2 (two) times daily. ?  ?ARIPiprazole 10 MG tablet ?Commonly known as: ABILIFY ?Take 10 mg by mouth at bedtime. ?  ?atorvastatin 40 MG tablet ?Commonly known as:  LIPITOR ?Take 1 tablet (40 mg total) by mouth daily. ?What changed: when to take this ?  ?cholecalciferol 25 MCG (1000 UNIT) tablet ?Commonly known as: VITAMIN D3 ?Take 1 tablet (1,000 Units total) by mouth daily. ?  ?desvenlafaxine 25 MG 24 hr tablet ?Commonly known as: PRISTIQ ?Take 25 mg by mouth in the morning. ?  ?famotidine 20 MG tablet ?Commonly known as: PEPCID ?Take 1 tablet (20 mg total) by mouth daily. ?  ?feeding supplement (GLUCERNA SHAKE) Liqd ?Take 237 mLs by mouth 2 (two) times daily between meals. ?  ?ferrous sulfate 325 (65 FE) MG tablet ?Take 325 mg by mouth 2 (two) times daily. ?  ?gabapentin 100 MG capsule ?Commonly known as: NEURONTIN ?Take 1 capsule (100 mg total) by mouth every 12 (twelve) hours. ?  ?insulin detemir 100 UNIT/ML FlexPen ?Commonly known as: LEVEMIR ?Inject 10 Units into the skin in the morning. ?  ?insulin lispro 100 UNIT/ML KwikPen ?Commonly known as: HUMALOG ?Inject 0-12 Units into the skin See admin instructions. Inject subcutaneously before meals per sliding scale. If BS 100-150; 2 units, 201-250; 4 units, 251-300; 6 units, 301-350; 8 units, 351-400; 10 units, 401-450; 12 units. If greater than 450 call MD. ?  ?lisinopril 10 MG tablet ?Commonly known as: ZESTRIL ?Take 10 mg by mouth daily. ?  ?melatonin 5 MG Tabs ?Take 5 mg by mouth at bedtime. ?  ?methadone 10 MG/ML solution ?Commonly known as: DOLOPHINE ?Take 25 mg by mouth in the morning. ?  ?MIRALAX PO ?Take 17 g by mouth daily. Mix in 4 to 8 oz of fluid of choice. ?  ?senna-docusate 8.6-50 MG tablet ?Commonly known as: Senokot-S ?Take 1 tablet by mouth in the morning. ?  ?tamsulosin 0.4 MG Caps capsule ?Commonly known as: FLOMAX ?Take 0.4 mg by mouth in the morning. ?  ? ?  ? ? ?Allergies: No Known Allergies ? ?Family History: ?Family History  ?Problem Relation Age of Onset  ? Arthritis Mother   ? Mental illness Mother   ?     depression  ? Diabetes Mother   ? Cancer Mother   ?     pancreatic  ? Cancer Father   ?      colon  ? Heart disease Father   ? Diabetes Father   ? Cancer Daughter   ?     lung  ? ? ?Social History:  reports that she quit smoking about 3 years ago. Her smoking use included cigarettes. She has a 15.00 pack-year smoking history. She has never used smokeless tobacco. She reports that she does not currently use drugs. She reports that she does not drink alcohol. ? ? ?Physical Exam: ?BP 110/72   Pulse 84   Ht 5\' 5"  (1.651 m)   Wt 154 lb (69.9 kg)   BMI 25.63 kg/m?   ?Constitutional:  Alert, no acute distress. ?HEENT: [place] AT, moist mucus membranes.  Trachea midline, no masses. ?Cardiovascular: No clubbing, cyanosis, or edema. ?Respiratory: Normal respiratory effort, no  increased work of breathing. ?GI: Abdomen is soft, nontender, nondistended, no abdominal masses ?GU: Foley catheter draining clear urine ? ? ? ?Pertinent Imaging: ?CT images personally reviewed and interpreted ? ?Assessment & Plan:   ? ?1.  Urinary retention ?Foley catheter removed for voiding trial ?She has follow-up scheduled this afternoon for PVR ? ?2.  Bilateral hydronephrosis ?Mild, bilateral hydronephrosis on prior CT ?Schedule follow-up renal ultrasound  ? ? ?Abbie Sons, MD ? ?[facility] ?[address] ?[place], [place] [address] ?[phone] ? ?

## 2021-09-08 NOTE — Patient Instructions (Signed)
Return as needed

## 2021-09-10 ENCOUNTER — Encounter: Payer: Self-pay | Admitting: Urology

## 2021-09-11 DIAGNOSIS — R41841 Cognitive communication deficit: Secondary | ICD-10-CM | POA: Diagnosis not present

## 2021-09-11 DIAGNOSIS — R2681 Unsteadiness on feet: Secondary | ICD-10-CM | POA: Diagnosis not present

## 2021-09-11 DIAGNOSIS — R296 Repeated falls: Secondary | ICD-10-CM | POA: Diagnosis not present

## 2021-09-11 DIAGNOSIS — Z736 Limitation of activities due to disability: Secondary | ICD-10-CM | POA: Diagnosis not present

## 2021-09-11 DIAGNOSIS — R1311 Dysphagia, oral phase: Secondary | ICD-10-CM | POA: Diagnosis not present

## 2021-09-11 DIAGNOSIS — G9341 Metabolic encephalopathy: Secondary | ICD-10-CM | POA: Diagnosis not present

## 2021-09-11 DIAGNOSIS — N390 Urinary tract infection, site not specified: Secondary | ICD-10-CM | POA: Diagnosis not present

## 2021-09-11 DIAGNOSIS — Z741 Need for assistance with personal care: Secondary | ICD-10-CM | POA: Diagnosis not present

## 2021-09-11 DIAGNOSIS — F0150 Vascular dementia without behavioral disturbance: Secondary | ICD-10-CM | POA: Diagnosis not present

## 2021-09-12 DIAGNOSIS — F1120 Opioid dependence, uncomplicated: Secondary | ICD-10-CM | POA: Diagnosis not present

## 2021-09-12 DIAGNOSIS — E118 Type 2 diabetes mellitus with unspecified complications: Secondary | ICD-10-CM | POA: Diagnosis not present

## 2021-09-12 DIAGNOSIS — R1311 Dysphagia, oral phase: Secondary | ICD-10-CM | POA: Diagnosis not present

## 2021-09-12 DIAGNOSIS — F0150 Vascular dementia without behavioral disturbance: Secondary | ICD-10-CM | POA: Diagnosis not present

## 2021-09-12 DIAGNOSIS — R41841 Cognitive communication deficit: Secondary | ICD-10-CM | POA: Diagnosis not present

## 2021-09-12 DIAGNOSIS — N390 Urinary tract infection, site not specified: Secondary | ICD-10-CM | POA: Diagnosis not present

## 2021-09-12 DIAGNOSIS — R296 Repeated falls: Secondary | ICD-10-CM | POA: Diagnosis not present

## 2021-09-12 DIAGNOSIS — R339 Retention of urine, unspecified: Secondary | ICD-10-CM | POA: Diagnosis not present

## 2021-09-12 DIAGNOSIS — Z741 Need for assistance with personal care: Secondary | ICD-10-CM | POA: Diagnosis not present

## 2021-09-12 DIAGNOSIS — R2681 Unsteadiness on feet: Secondary | ICD-10-CM | POA: Diagnosis not present

## 2021-09-12 DIAGNOSIS — Z736 Limitation of activities due to disability: Secondary | ICD-10-CM | POA: Diagnosis not present

## 2021-09-12 DIAGNOSIS — L89610 Pressure ulcer of right heel, unstageable: Secondary | ICD-10-CM | POA: Diagnosis not present

## 2021-09-12 DIAGNOSIS — G9341 Metabolic encephalopathy: Secondary | ICD-10-CM | POA: Diagnosis not present

## 2021-09-13 DIAGNOSIS — Z736 Limitation of activities due to disability: Secondary | ICD-10-CM | POA: Diagnosis not present

## 2021-09-13 DIAGNOSIS — R1311 Dysphagia, oral phase: Secondary | ICD-10-CM | POA: Diagnosis not present

## 2021-09-13 DIAGNOSIS — R296 Repeated falls: Secondary | ICD-10-CM | POA: Diagnosis not present

## 2021-09-13 DIAGNOSIS — F0150 Vascular dementia without behavioral disturbance: Secondary | ICD-10-CM | POA: Diagnosis not present

## 2021-09-13 DIAGNOSIS — N390 Urinary tract infection, site not specified: Secondary | ICD-10-CM | POA: Diagnosis not present

## 2021-09-13 DIAGNOSIS — Z741 Need for assistance with personal care: Secondary | ICD-10-CM | POA: Diagnosis not present

## 2021-09-13 DIAGNOSIS — R41841 Cognitive communication deficit: Secondary | ICD-10-CM | POA: Diagnosis not present

## 2021-09-13 DIAGNOSIS — G9341 Metabolic encephalopathy: Secondary | ICD-10-CM | POA: Diagnosis not present

## 2021-09-13 DIAGNOSIS — L89610 Pressure ulcer of right heel, unstageable: Secondary | ICD-10-CM | POA: Diagnosis not present

## 2021-09-13 DIAGNOSIS — R2681 Unsteadiness on feet: Secondary | ICD-10-CM | POA: Diagnosis not present

## 2021-09-14 DIAGNOSIS — R1311 Dysphagia, oral phase: Secondary | ICD-10-CM | POA: Diagnosis not present

## 2021-09-14 DIAGNOSIS — Z741 Need for assistance with personal care: Secondary | ICD-10-CM | POA: Diagnosis not present

## 2021-09-14 DIAGNOSIS — N390 Urinary tract infection, site not specified: Secondary | ICD-10-CM | POA: Diagnosis not present

## 2021-09-14 DIAGNOSIS — R2681 Unsteadiness on feet: Secondary | ICD-10-CM | POA: Diagnosis not present

## 2021-09-14 DIAGNOSIS — F0150 Vascular dementia without behavioral disturbance: Secondary | ICD-10-CM | POA: Diagnosis not present

## 2021-09-14 DIAGNOSIS — G9341 Metabolic encephalopathy: Secondary | ICD-10-CM | POA: Diagnosis not present

## 2021-09-14 DIAGNOSIS — Z736 Limitation of activities due to disability: Secondary | ICD-10-CM | POA: Diagnosis not present

## 2021-09-14 DIAGNOSIS — R41841 Cognitive communication deficit: Secondary | ICD-10-CM | POA: Diagnosis not present

## 2021-09-14 DIAGNOSIS — R296 Repeated falls: Secondary | ICD-10-CM | POA: Diagnosis not present

## 2021-09-15 DIAGNOSIS — R1311 Dysphagia, oral phase: Secondary | ICD-10-CM | POA: Diagnosis not present

## 2021-09-15 DIAGNOSIS — E118 Type 2 diabetes mellitus with unspecified complications: Secondary | ICD-10-CM | POA: Diagnosis not present

## 2021-09-15 DIAGNOSIS — R634 Abnormal weight loss: Secondary | ICD-10-CM | POA: Diagnosis not present

## 2021-09-15 DIAGNOSIS — R296 Repeated falls: Secondary | ICD-10-CM | POA: Diagnosis not present

## 2021-09-15 DIAGNOSIS — N390 Urinary tract infection, site not specified: Secondary | ICD-10-CM | POA: Diagnosis not present

## 2021-09-15 DIAGNOSIS — L89610 Pressure ulcer of right heel, unstageable: Secondary | ICD-10-CM | POA: Diagnosis not present

## 2021-09-15 DIAGNOSIS — R2681 Unsteadiness on feet: Secondary | ICD-10-CM | POA: Diagnosis not present

## 2021-09-15 DIAGNOSIS — Z741 Need for assistance with personal care: Secondary | ICD-10-CM | POA: Diagnosis not present

## 2021-09-15 DIAGNOSIS — G9341 Metabolic encephalopathy: Secondary | ICD-10-CM | POA: Diagnosis not present

## 2021-09-15 DIAGNOSIS — F0150 Vascular dementia without behavioral disturbance: Secondary | ICD-10-CM | POA: Diagnosis not present

## 2021-09-15 DIAGNOSIS — Z736 Limitation of activities due to disability: Secondary | ICD-10-CM | POA: Diagnosis not present

## 2021-09-15 DIAGNOSIS — R41841 Cognitive communication deficit: Secondary | ICD-10-CM | POA: Diagnosis not present

## 2021-09-16 DIAGNOSIS — F0150 Vascular dementia without behavioral disturbance: Secondary | ICD-10-CM | POA: Diagnosis not present

## 2021-09-16 DIAGNOSIS — R1311 Dysphagia, oral phase: Secondary | ICD-10-CM | POA: Diagnosis not present

## 2021-09-16 DIAGNOSIS — Z736 Limitation of activities due to disability: Secondary | ICD-10-CM | POA: Diagnosis not present

## 2021-09-16 DIAGNOSIS — R2681 Unsteadiness on feet: Secondary | ICD-10-CM | POA: Diagnosis not present

## 2021-09-16 DIAGNOSIS — R41841 Cognitive communication deficit: Secondary | ICD-10-CM | POA: Diagnosis not present

## 2021-09-16 DIAGNOSIS — G9341 Metabolic encephalopathy: Secondary | ICD-10-CM | POA: Diagnosis not present

## 2021-09-16 DIAGNOSIS — R296 Repeated falls: Secondary | ICD-10-CM | POA: Diagnosis not present

## 2021-09-16 DIAGNOSIS — N390 Urinary tract infection, site not specified: Secondary | ICD-10-CM | POA: Diagnosis not present

## 2021-09-16 DIAGNOSIS — Z741 Need for assistance with personal care: Secondary | ICD-10-CM | POA: Diagnosis not present

## 2021-09-17 DIAGNOSIS — Z741 Need for assistance with personal care: Secondary | ICD-10-CM | POA: Diagnosis not present

## 2021-09-17 DIAGNOSIS — N390 Urinary tract infection, site not specified: Secondary | ICD-10-CM | POA: Diagnosis not present

## 2021-09-17 DIAGNOSIS — Z736 Limitation of activities due to disability: Secondary | ICD-10-CM | POA: Diagnosis not present

## 2021-09-17 DIAGNOSIS — R296 Repeated falls: Secondary | ICD-10-CM | POA: Diagnosis not present

## 2021-09-17 DIAGNOSIS — R41841 Cognitive communication deficit: Secondary | ICD-10-CM | POA: Diagnosis not present

## 2021-09-17 DIAGNOSIS — R2681 Unsteadiness on feet: Secondary | ICD-10-CM | POA: Diagnosis not present

## 2021-09-17 DIAGNOSIS — R1311 Dysphagia, oral phase: Secondary | ICD-10-CM | POA: Diagnosis not present

## 2021-09-17 DIAGNOSIS — G9341 Metabolic encephalopathy: Secondary | ICD-10-CM | POA: Diagnosis not present

## 2021-09-17 DIAGNOSIS — F0150 Vascular dementia without behavioral disturbance: Secondary | ICD-10-CM | POA: Diagnosis not present

## 2021-09-18 DIAGNOSIS — Z741 Need for assistance with personal care: Secondary | ICD-10-CM | POA: Diagnosis not present

## 2021-09-18 DIAGNOSIS — R296 Repeated falls: Secondary | ICD-10-CM | POA: Diagnosis not present

## 2021-09-18 DIAGNOSIS — R41841 Cognitive communication deficit: Secondary | ICD-10-CM | POA: Diagnosis not present

## 2021-09-18 DIAGNOSIS — R1311 Dysphagia, oral phase: Secondary | ICD-10-CM | POA: Diagnosis not present

## 2021-09-18 DIAGNOSIS — F0150 Vascular dementia without behavioral disturbance: Secondary | ICD-10-CM | POA: Diagnosis not present

## 2021-09-18 DIAGNOSIS — R2681 Unsteadiness on feet: Secondary | ICD-10-CM | POA: Diagnosis not present

## 2021-09-18 DIAGNOSIS — N390 Urinary tract infection, site not specified: Secondary | ICD-10-CM | POA: Diagnosis not present

## 2021-09-18 DIAGNOSIS — G9341 Metabolic encephalopathy: Secondary | ICD-10-CM | POA: Diagnosis not present

## 2021-09-18 DIAGNOSIS — Z736 Limitation of activities due to disability: Secondary | ICD-10-CM | POA: Diagnosis not present

## 2021-09-19 DIAGNOSIS — R296 Repeated falls: Secondary | ICD-10-CM | POA: Diagnosis not present

## 2021-09-19 DIAGNOSIS — R2681 Unsteadiness on feet: Secondary | ICD-10-CM | POA: Diagnosis not present

## 2021-09-19 DIAGNOSIS — F064 Anxiety disorder due to known physiological condition: Secondary | ICD-10-CM | POA: Diagnosis not present

## 2021-09-19 DIAGNOSIS — Z741 Need for assistance with personal care: Secondary | ICD-10-CM | POA: Diagnosis not present

## 2021-09-19 DIAGNOSIS — G9341 Metabolic encephalopathy: Secondary | ICD-10-CM | POA: Diagnosis not present

## 2021-09-19 DIAGNOSIS — F1194 Opioid use, unspecified with opioid-induced mood disorder: Secondary | ICD-10-CM | POA: Diagnosis not present

## 2021-09-19 DIAGNOSIS — F0150 Vascular dementia without behavioral disturbance: Secondary | ICD-10-CM | POA: Diagnosis not present

## 2021-09-19 DIAGNOSIS — F331 Major depressive disorder, recurrent, moderate: Secondary | ICD-10-CM | POA: Diagnosis not present

## 2021-09-19 DIAGNOSIS — R1311 Dysphagia, oral phase: Secondary | ICD-10-CM | POA: Diagnosis not present

## 2021-09-19 DIAGNOSIS — R41841 Cognitive communication deficit: Secondary | ICD-10-CM | POA: Diagnosis not present

## 2021-09-19 DIAGNOSIS — N390 Urinary tract infection, site not specified: Secondary | ICD-10-CM | POA: Diagnosis not present

## 2021-09-19 DIAGNOSIS — Z736 Limitation of activities due to disability: Secondary | ICD-10-CM | POA: Diagnosis not present

## 2021-09-20 DIAGNOSIS — N390 Urinary tract infection, site not specified: Secondary | ICD-10-CM | POA: Diagnosis not present

## 2021-09-20 DIAGNOSIS — G9341 Metabolic encephalopathy: Secondary | ICD-10-CM | POA: Diagnosis not present

## 2021-09-20 DIAGNOSIS — Z736 Limitation of activities due to disability: Secondary | ICD-10-CM | POA: Diagnosis not present

## 2021-09-20 DIAGNOSIS — R1311 Dysphagia, oral phase: Secondary | ICD-10-CM | POA: Diagnosis not present

## 2021-09-20 DIAGNOSIS — R2681 Unsteadiness on feet: Secondary | ICD-10-CM | POA: Diagnosis not present

## 2021-09-20 DIAGNOSIS — Z741 Need for assistance with personal care: Secondary | ICD-10-CM | POA: Diagnosis not present

## 2021-09-20 DIAGNOSIS — R41841 Cognitive communication deficit: Secondary | ICD-10-CM | POA: Diagnosis not present

## 2021-09-20 DIAGNOSIS — R296 Repeated falls: Secondary | ICD-10-CM | POA: Diagnosis not present

## 2021-09-20 DIAGNOSIS — L89610 Pressure ulcer of right heel, unstageable: Secondary | ICD-10-CM | POA: Diagnosis not present

## 2021-09-20 DIAGNOSIS — F0150 Vascular dementia without behavioral disturbance: Secondary | ICD-10-CM | POA: Diagnosis not present

## 2021-09-21 DIAGNOSIS — L89610 Pressure ulcer of right heel, unstageable: Secondary | ICD-10-CM | POA: Diagnosis not present

## 2021-09-21 DIAGNOSIS — E118 Type 2 diabetes mellitus with unspecified complications: Secondary | ICD-10-CM | POA: Diagnosis not present

## 2021-09-27 DIAGNOSIS — L89612 Pressure ulcer of right heel, stage 2: Secondary | ICD-10-CM | POA: Diagnosis not present

## 2021-10-02 DIAGNOSIS — L89612 Pressure ulcer of right heel, stage 2: Secondary | ICD-10-CM | POA: Diagnosis not present

## 2021-10-03 DIAGNOSIS — F1194 Opioid use, unspecified with opioid-induced mood disorder: Secondary | ICD-10-CM | POA: Diagnosis not present

## 2021-10-03 DIAGNOSIS — F331 Major depressive disorder, recurrent, moderate: Secondary | ICD-10-CM | POA: Diagnosis not present

## 2021-10-03 DIAGNOSIS — F064 Anxiety disorder due to known physiological condition: Secondary | ICD-10-CM | POA: Diagnosis not present

## 2021-10-04 DIAGNOSIS — G9341 Metabolic encephalopathy: Secondary | ICD-10-CM | POA: Diagnosis not present

## 2021-10-04 DIAGNOSIS — L89612 Pressure ulcer of right heel, stage 2: Secondary | ICD-10-CM | POA: Diagnosis not present

## 2021-10-04 DIAGNOSIS — R296 Repeated falls: Secondary | ICD-10-CM | POA: Diagnosis not present

## 2021-10-04 DIAGNOSIS — Z736 Limitation of activities due to disability: Secondary | ICD-10-CM | POA: Diagnosis not present

## 2021-10-04 DIAGNOSIS — N390 Urinary tract infection, site not specified: Secondary | ICD-10-CM | POA: Diagnosis not present

## 2021-10-04 DIAGNOSIS — F0150 Vascular dementia without behavioral disturbance: Secondary | ICD-10-CM | POA: Diagnosis not present

## 2021-10-04 DIAGNOSIS — R2681 Unsteadiness on feet: Secondary | ICD-10-CM | POA: Diagnosis not present

## 2021-10-04 DIAGNOSIS — Z741 Need for assistance with personal care: Secondary | ICD-10-CM | POA: Diagnosis not present

## 2021-10-04 DIAGNOSIS — R1311 Dysphagia, oral phase: Secondary | ICD-10-CM | POA: Diagnosis not present

## 2021-10-04 DIAGNOSIS — R41841 Cognitive communication deficit: Secondary | ICD-10-CM | POA: Diagnosis not present

## 2021-10-05 DIAGNOSIS — F064 Anxiety disorder due to known physiological condition: Secondary | ICD-10-CM | POA: Diagnosis not present

## 2021-10-05 DIAGNOSIS — F0150 Vascular dementia without behavioral disturbance: Secondary | ICD-10-CM | POA: Diagnosis not present

## 2021-10-05 DIAGNOSIS — F331 Major depressive disorder, recurrent, moderate: Secondary | ICD-10-CM | POA: Diagnosis not present

## 2021-10-05 DIAGNOSIS — F1194 Opioid use, unspecified with opioid-induced mood disorder: Secondary | ICD-10-CM | POA: Diagnosis not present

## 2021-10-05 DIAGNOSIS — R419 Unspecified symptoms and signs involving cognitive functions and awareness: Secondary | ICD-10-CM | POA: Diagnosis not present

## 2021-10-05 DIAGNOSIS — G4701 Insomnia due to medical condition: Secondary | ICD-10-CM | POA: Diagnosis not present

## 2021-10-06 DIAGNOSIS — R2681 Unsteadiness on feet: Secondary | ICD-10-CM | POA: Diagnosis not present

## 2021-10-06 DIAGNOSIS — Z741 Need for assistance with personal care: Secondary | ICD-10-CM | POA: Diagnosis not present

## 2021-10-06 DIAGNOSIS — G9341 Metabolic encephalopathy: Secondary | ICD-10-CM | POA: Diagnosis not present

## 2021-10-06 DIAGNOSIS — R1311 Dysphagia, oral phase: Secondary | ICD-10-CM | POA: Diagnosis not present

## 2021-10-06 DIAGNOSIS — F0150 Vascular dementia without behavioral disturbance: Secondary | ICD-10-CM | POA: Diagnosis not present

## 2021-10-06 DIAGNOSIS — N390 Urinary tract infection, site not specified: Secondary | ICD-10-CM | POA: Diagnosis not present

## 2021-10-06 DIAGNOSIS — Z736 Limitation of activities due to disability: Secondary | ICD-10-CM | POA: Diagnosis not present

## 2021-10-06 DIAGNOSIS — R41841 Cognitive communication deficit: Secondary | ICD-10-CM | POA: Diagnosis not present

## 2021-10-06 DIAGNOSIS — R296 Repeated falls: Secondary | ICD-10-CM | POA: Diagnosis not present

## 2021-12-01 ENCOUNTER — Non-Acute Institutional Stay: Payer: Medicare HMO | Admitting: Primary Care

## 2021-12-01 DIAGNOSIS — Z515 Encounter for palliative care: Secondary | ICD-10-CM

## 2021-12-01 DIAGNOSIS — N390 Urinary tract infection, site not specified: Secondary | ICD-10-CM

## 2021-12-01 DIAGNOSIS — R5381 Other malaise: Secondary | ICD-10-CM

## 2021-12-01 DIAGNOSIS — G8929 Other chronic pain: Secondary | ICD-10-CM

## 2021-12-01 NOTE — Progress Notes (Addendum)
[place] Consult Note Telephone: [phone]  Fax: [phone]    Date of encounter: 12/01/21 12:46 PM PATIENT NAME: Yesenia Shepard [address] [place] [address]   [phone] (home)  DOB: April 13, 1951 MRN: [id number] PRIMARY CARE PROVIDER:    [name], [name], MD,  [place] [address] [phone]  REFERRING PROVIDER:   Rica Shepard, [address] [address],  [place] [address] [phone]   RESPONSIBLE PARTY:    Contact Information     Name Relation Home Work Mobile   Yesenia Shepard [phone] Sister   [phone]   Yesenia Shepard Sister   [phone]   Yesenia Shepard Relative   [phone]        I met face to face with [facility] in [facility]. Palliative Care was asked to follow this patient by consultation request of  Yesenia Koyanagi, MD  to address advance care planning and complex medical decision making. This is a follow up visit.                                   ASSESSMENT AND PLAN / RECOMMENDATIONS:   Advance Care Planning/Goals of Care: Goals include to maximize quality of life and symptom management.   Symptom Management/Plan:  I met with patient in her nursing home room. She endorses current dysuria and abdominal pain.  She has made improvement back to her baseline after serious illness several months ago. Today she states she would like some relief from the UTI. PCP Np will order Culture and sensitivity. Due to policy they will wait until this is back before treating. However, I asked her to put in a prescription for Pyridium, which she will do. No changes in any advance care planning. POA is her sister.  Follow up Palliative Care Visit: Palliative care will continue to follow for complex medical decision making, advance care planning, and clarification of goals. Return 8 weeks or prn.  I spent 30 minutes providing this consultation. More than 50% of the time in  this consultation was spent in counseling and care coordination.   PPS: 40%  HOSPICE ELIGIBILITY/DIAGNOSIS: TBD  Chief Complaint: immobility, debility, dysuria  HISTORY OF PRESENT ILLNESS:  Yesenia Shepard is a 71 y.o. year old female  with immobility, dysuria, cognitive impairment . Patient seen today to review palliative care needs to include medical decision making and advance care planning as appropriate.   History obtained from review of EMR, discussion with primary team, and interview with family, facility staff/caregiver and/or Yesenia Shepard.  I reviewed available labs, medications, imaging, studies and related documents from the EMR.  Records reviewed and summarized above.   ROS   General: NAD EYES: denies vision changes ENMT: denies dysphagia Cardiovascular: denies chest pain, denies DOE Pulmonary: denies cough, denies increased SOB Abdomen: endorses good appetite, denies constipation, endorses continence of bowel GU: endorses dysuria, endorses continence of urine MSK:  endorses   increased weakness,  no falls reported Skin: denies rashes or wounds Neurological: endorses abdomenal pain, denies insomnia Psych: Endorses positive mood  Physical Exam: Current and past weights: 153 lbs Constitutional: NAD General: frail appearing,  EYES: anicteric sclera, lids intact, no discharge  ENMT: intact hearing, oral mucous membranes moist, dentition intact CV:  no LE edema Pulmonary: no increased work of breathing, no cough, room air Abdomen: intake 75%, normo-active BS + 4 quadrants, soft and non tender, no  ascites MSK:  mild sarcopenia, moves all extremities,  non ambulatory Skin: warm and dry, no rashes or wounds on visible skin Neuro:  no new  generalized weakness,  + cognitive impairment,anxious affect   Thank you for the opportunity to participate in the care of Yesenia Shepard.  The palliative care team will continue to follow. Please call our office at [phone] if we can be of  additional assistance.   Jason Coop, NP DNP, AGPCNP-BC  COVID-19 PATIENT SCREENING TOOL Asked and negative response unless otherwise noted:   Have you had symptoms of covid, tested positive or been in contact with someone with symptoms/positive test in the past 5-10 days?

## 2022-03-02 ENCOUNTER — Non-Acute Institutional Stay: Payer: Medicare HMO | Admitting: Primary Care

## 2022-03-02 DIAGNOSIS — R5381 Other malaise: Secondary | ICD-10-CM

## 2022-03-02 DIAGNOSIS — Z515 Encounter for palliative care: Secondary | ICD-10-CM

## 2022-03-02 NOTE — Progress Notes (Signed)
[facility] Consult Note Telephone: [phone]  Fax: [phone]    Date of encounter: 03/02/22 12:23 PM PATIENT NAME: Yesenia Shepard [address] [place] [address]   [phone] (home)  DOB: 13-Jun-1951 MRN: [id number] PRIMARY CARE PROVIDER:    [name], [name], MD,  [place] [address] [phone]  REFERRING PROVIDER:   [name], [name], MD [place],  [place] [address] [phone]  RESPONSIBLE PARTY:    Contact Information     Name Relation Home Work Mobile   [place] [phone] Sister   [phone]   Kayo Zion,Robin Sister   [phone]   Strausbaugh,Bill Relative   [phone]        I met [facility] to [facility] in [facility]. Palliative Care was asked to follow this patient by consultation request of   to address advance care planning and complex medical decision making. This is a follow up visit.                                   ASSESSMENT AND PLAN / RECOMMENDATIONS:   Advance Care Planning/Goals of Care: Goals include to maximize quality of life and symptom management. Patient/health care surrogate gave his/her permission to discuss.Our advance care planning conversation included a discussion about:      Exploration of personal, cultural or spiritual beliefs that might influence medical decisions  Exploration of goals of care in the event of a sudden injury or illness  Identification of a healthcare agent - sister Review \ of an  advance directive document . CODE STATUS: DNR  Symptom Management/Plan:  Patient is voicing anxiety. She has been doing well physically but voices a lot of anxiety. She is currently followed by psych. She does enjoy some activities in the building. She states she's eating well and has no pain.    Follow up Palliative Care Visit: Palliative care will continue to follow for complex medical decision making, advance  care planning, and clarification of goals. Return 12 weeks or prn.  I spent 15 minutes providing this consultation. More than 50% of the time in this consultation was spent in counseling and care coordination.  PPS: 40%  HOSPICE ELIGIBILITY/DIAGNOSIS: TBD  Chief Complaint: anxiety  HISTORY OF PRESENT ILLNESS:  Yesenia Shepard is a 71 y.o. year old female  with anxiety, debility . Patient seen today to review palliative care needs to include medical decision making and advance care planning as appropriate.   History obtained from review of EMR, discussion with primary team, and interview with family, facility staff/caregiver and/or Ms. Newingham.  I reviewed available labs, medications, imaging, studies and related documents from the EMR.  Records reviewed and summarized above.   ROS   General: NAD EYES: denies vision changes ENMT: denies dysphagia Cardiovascular: denies chest pain, denies DOE Pulmonary: denies cough, denies increased SOB Abdomen: endorses good appetite, denies constipation, endorses continence of bowel GU: denies dysuria, endorses continence of urine MSK:  denies  increased weakness,  no falls reported Skin: denies rashes or wounds Neurological: denies pain, denies insomnia Psych: Endorses positive mood, anxious at times.  Physical Exam: Current and past weights: 154 lbs, stable  Constitutional: NAD General: frail appearing, WNWD  EYES: anicteric sclera, lids intact, no discharge  ENMT: intact hearing, oral mucous membranes moist, dentition intact CV: 1+  LE edema Pulmonary: no increased work of breathing, no  cough, room air Abdomen: intake 70%,  no ascites MSK:  +, moves all extremities,  non ambulatory Skin: warm and dry, no rashes or wounds on visible skin Neuro:   generalized weakness,  + cognitive impairment, non-anxious affect  Thank you for the opportunity to participate in the care of Yesenia Shepard.  The palliative care team will continue to follow. Please call  our office at [phone] if we can be of additional assistance.   Jason Coop, NP DNP, AGPCNP-BC  COVID-19 PATIENT SCREENING TOOL Asked and negative response unless otherwise noted:   Have you had symptoms of covid, tested positive or been in contact with someone with symptoms/positive test in the past 5-10 days?

## 2022-04-10 DIAGNOSIS — Z23 Encounter for immunization: Secondary | ICD-10-CM | POA: Diagnosis not present

## 2022-05-07 ENCOUNTER — Encounter (INDEPENDENT_AMBULATORY_CARE_PROVIDER_SITE_OTHER): Payer: Self-pay

## 2022-05-18 ENCOUNTER — Non-Acute Institutional Stay: Payer: Medicare HMO | Admitting: Primary Care

## 2022-05-18 DIAGNOSIS — Z515 Encounter for palliative care: Secondary | ICD-10-CM

## 2022-05-18 DIAGNOSIS — G8929 Other chronic pain: Secondary | ICD-10-CM

## 2022-05-18 DIAGNOSIS — R5381 Other malaise: Secondary | ICD-10-CM

## 2022-05-18 NOTE — Progress Notes (Signed)
[facility] Consult Note Telephone: [phone]  Fax: [phone]    Date of encounter: 05/18/22 11:02 AM PATIENT NAME: Yesenia Shepard [address] [place] [address]   [phone] (home)  DOB: 12-May-1951 MRN: [id number] PRIMARY CARE PROVIDER:    Cephas Shepard, [place] [address]  REFERRING PROVIDER:    Cephas Shepard, [address] [place],  [place] [address]  RESPONSIBLE PARTY:    Contact Information     Name Relation Home Work Mobile   [place] [phone] Shepard   [phone]   Yesenia Shepard,Yesenia Shepard   [phone]   Strausbaugh,Yesenia Shepard   [phone]        I met face to face with patient  in [facility]. Palliative Care was asked to follow this patient by consultation request of  Yesenia Darby, FNP  to address advance care planning and complex medical decision making. This is a follow up visit.                                   ASSESSMENT AND PLAN / RECOMMENDATIONS:   Advance Care Planning/Goals of Care: Goals include to maximize quality of life and symptom management. CODE STATUS: DNR  Symptom Management/Plan: Patient up in SNF, able to self mobilize in w/c. Enjoys playing bingo and visiting with other residents. Likes to go out doors, denies smoking but states she is a former smoker. Staff endorse she orders door dash and drinks soda and has increased her weight 30 lbs in 3-4 months. She is more incontinent. She is at her baseline for mentation.   Follow up Palliative Care Visit: Palliative care will continue to follow for complex medical decision making, advance care planning, and clarification of goals. Return 12 weeks or prn.  I spent 15 minutes providing this consultation. More than 50% of the time in this consultation was spent in counseling and care coordination.   PPS: 40%  HOSPICE ELIGIBILITY/DIAGNOSIS: no  Chief Complaint: debility  HISTORY OF PRESENT  ILLNESS:  Yesenia Shepard is a 71 y.o. year old female  with dementia, DM, CAD .   History obtained from review of EMR, discussion with primary team, and interview with family, facility staff/caregiver and/or Yesenia Shepard.  I reviewed available labs, medications, imaging, studies and related documents from the EMR.  Records reviewed and summarized above.   ROS Yesenia Shepard  General: NAD EYES: denies vision changes ENMT: denies dysphagia Cardiovascular: denies chest pain, denies DOE Pulmonary: denies cough, denies increased SOB Abdomen: endorses good appetite, denies constipation, endorses incontinence of bowel GU: denies dysuria, endorses incontinence of urine MSK:  denies increased weakness, no falls reported Skin: denies rashes or wounds Neurological: denies pain, denies insomnia Psych: Endorses positive mood Heme/lymph/immuno: denies bruises, abnormal bleeding  Physical Exam: Current and past weights: 185 lbs, 30 lb weight gain in 4 months due to excessive calories Constitutional: NAD General: frail appearing, obese  EYES: anicteric sclera, lids intact, no discharge  ENMT: intact hearing, oral mucous membranes moist, dentition intact Pulmonary: no increased work of breathing, no cough, room air Abdomen: intake 100%,  soft and non tender, no ascites GU: deferred MSK: no sarcopenia, moves all extremities, non ambulatory, uses w/c Skin: warm and dry, no rashes or wounds on visible skin Neuro:  no new generalized weakness,  +cognitive impairment Psych: non-anxious affect, A and O x 2-3  Hem/lymph/immuno: no widespread bruising   Thank you for the opportunity to participate in the care of Yesenia Shepard. Please call our office at [phone] if we can be of additional assistance.   Yesenia Coop, NP   COVID-19 PATIENT SCREENING TOOL Asked and negative response unless otherwise noted:   Have you had symptoms of covid, tested positive or been in contact with someone with  symptoms/positive test in the past 5-10 days?

## 2022-07-12 ENCOUNTER — Non-Acute Institutional Stay: Payer: Medicare HMO | Admitting: Nurse Practitioner

## 2022-07-12 ENCOUNTER — Encounter: Payer: Self-pay | Admitting: Nurse Practitioner

## 2022-07-12 DIAGNOSIS — F01518 Vascular dementia, unspecified severity, with other behavioral disturbance: Secondary | ICD-10-CM

## 2022-07-12 DIAGNOSIS — J111 Influenza due to unidentified influenza virus with other respiratory manifestations: Secondary | ICD-10-CM

## 2022-07-12 DIAGNOSIS — Z515 Encounter for palliative care: Secondary | ICD-10-CM

## 2022-07-12 DIAGNOSIS — R051 Acute cough: Secondary | ICD-10-CM

## 2022-07-12 NOTE — Progress Notes (Signed)
[facility] Consult Note Telephone: [phone]  Fax: [phone]    Date of encounter: 07/12/22 3:24 PM PATIENT NAME: Jeanell Sparrow [address] [place] [address]   [phone] (home)  DOB: [birth year] MRN: [id number] PRIMARY CARE PROVIDER:    [facility] [address] [place] [address] [phone]  RESPONSIBLE PARTY:    Contact Information     Name Relation Home Work Mobile   [place] [phone] Sister   [phone]   Smith,Robin Sister   [phone]   Strausbaugh,Bill Relative   [phone]      I met face to face with patient in facility. Palliative Care was asked to follow this patient by consultation request of  [facility] to address advance care planning and complex medical decision making. This is a follow up visit.                                  ASSESSMENT AND PLAN / RECOMMENDATIONS:  Symptom Management/Plan: 1. Advance Care Planning;  DNR  I contacted Milbert Coulter, clinical update given, reviewed medical goals, poc, medications, continue with encouraging socialization, monitoring cough, s/s for worsening flu, fever. Continue to follow with pc monitoring chronic disease progression, symptoms, monitor for decline, supportive role.   2. Goals of Care: Goals include to maximize quality of life and symptom management. Our advance care planning conversation included a discussion about:    The value and importance of advance care planning  Exploration of personal, cultural or spiritual beliefs that might influence medical decisions  Exploration of goals of care in the event of a sudden injury or illness  Identification and preparation of a healthcare agent  Review and updating or creation of an advance directive document. 3. Palliative care encounter; Palliative care encounter; Palliative medicine team will continue to support patient, patient's family, and medical team. Visit consisted of  counseling and education dealing with the complex and emotionally intense issues of symptom management and palliative care in the setting of serious and potentially life-threatening illness  4. Vascular Dementia currently appears stable, reviewed weights, stable,  05/18/2022 weight 185 lbs (per notes at this time gained 30lbs/4 months due to excessive calories) 06/12/2022 weight 180.8 lbs Appears to continue to socialize, go to activities, outside, movie night per [place], [place]. Continue to encourage weight loss with decreasing calories, healthy food choices, limiting door dash if able  5. Influenza with cough, no fever, discussed importance of hydration, eating. Supportive management for cough, continue current medications, reviewed.    Follow up Palliative Care Visit: Palliative care will continue to follow for complex medical decision making, advance care planning, and clarification of goals. Return 4 to 8 weeks or prn.  I spent 46 minutes providing this consultation. More than 50% of the time in this consultation was spent in counseling and care coordination PPS: 40% Chief Complaint: Follow up palliative consult for complex medical decision making, address goals, manage ongoing symptoms  HISTORY OF PRESENT ILLNESS:  Sharmane R Bangerter is a 72 y.o. year old female  with multiple medical problems including Vascular Dementia, dysphagia, PVD, HTN, h/o DVT, CAD, hepatic cirrhosis, hepatitis C, DM, OA, CKD, vitamin D deficiency, HLD, chronic pain, depression, anxiety. Ms Viar resides LTC at [facility]. Ms Esqueda transfers to w/c, requires assistance with ADL's including bathing, dressing, toileting. Ms Descoteaux does feed herself with fair appetite, current weight 180.8 lbs which  is a loss, though last pc visit in 05/2022 noted a 30 lbs weight gain in 4 months due to excessive calorie intake which per staff has seemed to improve with food choices. Staff endorses currently being treated for influenza, continues to  have a cough. No recent hospitalizations, wounds, falls. At present Ms Sachse is sitting in the w/c in her room, appears comfortable. Denies any concerns or complaints. No visitors present. I visited and observed Ms Adamec, we talked about dry cough, importance of hydration, We talked about appetite, weight, foods, importance of oob, mobility, functional abilities. We talked about role pc though limited with cognition. Ms Geathers was cooperative with assessment. Supportive role. I contacted Milbert Coulter, clinical update given, reviewed medical goals, poc, medications, continue with encouraging socialization, monitoring cough, s/s for worsening flu, fever. Continue to follow with pc monitoring chronic disease progression, symptoms, monitor for decline, supportive role. Updated staff.   History obtained from review of EMR, discussion with primary team, and interview with family, facility staff/caregiver and/or Ms. Cecilio.  I reviewed available labs, medications, imaging, studies and related documents from the EMR.  Records reviewed and summarized above.   Physical Exam: Constitutional: NAD General: pleasant female ENMT: oral mucous membranes moist CV: S1S2, RRR Pulmonary: LCTA Abdomen: normo-active BS + 4 quadrants, soft and non tender Skin: warm and dry Neuro:  no generalized weakness Psych: non-anxious affect, A and O Thank you for the opportunity to participate in the care of Ms. Conkey. Please call our office at [phone] if we can be of additional assistance.   Toure Edmonds Ihor Gully, NP

## 2022-08-23 ENCOUNTER — Non-Acute Institutional Stay: Payer: Medicare HMO | Admitting: Nurse Practitioner

## 2022-08-23 ENCOUNTER — Encounter: Payer: Self-pay | Admitting: Nurse Practitioner

## 2022-08-23 DIAGNOSIS — R5381 Other malaise: Secondary | ICD-10-CM

## 2022-08-23 DIAGNOSIS — Z515 Encounter for palliative care: Secondary | ICD-10-CM

## 2022-08-23 DIAGNOSIS — F01518 Vascular dementia, unspecified severity, with other behavioral disturbance: Secondary | ICD-10-CM

## 2022-08-23 NOTE — Progress Notes (Signed)
[facility] Consult Note Telephone: [phone]  Fax: [phone]    Date of encounter: 08/23/22 6:33 PM PATIENT NAME: Jeanell Sparrow [address] [place] [address]   [phone] (home)  DOB: [birth year] MRN: [id number] PRIMARY CARE PROVIDER:    [facility]  RESPONSIBLE PARTY:    Contact Information     Name Relation Home Work Mobile   [place] [phone] Sister   [phone]   Smith,Robin Sister   [phone]   Strausbaugh,Bill Relative   [phone]           [facility] Palliative Care Consult Note Telephone: [phone]  Fax: [phone]      Date of encounter: 07/12/22 3:24 PM PATIENT NAME: Jeanell Sparrow [address] [place] [address]   [phone] (home)  DOB: May 17, 1951 MRN: [id number] PRIMARY CARE PROVIDER:    [facility] [address] [place] [address] [phone]   RESPONSIBLE PARTY:    Contact Information       Name Relation Home Work Mobile    [place] [phone] Sister     [phone]    Smith,Robin Sister     [phone]    Strausbaugh,Bill Relative     [phone]         I met face to face with patient in facility. Palliative Care was asked to follow this patient by consultation request of  [facility] to address advance care planning and complex medical decision making. This is a follow up visit.                                  ASSESSMENT AND PLAN / RECOMMENDATIONS:  Symptom Management/Plan: 1. Advance Care Planning;  DNR   I contacted Milbert Coulter, clinical update given, reviewed medical goals, poc, medications, continue with encouraging socialization, monitoring cough, s/s for worsening flu, fever. Continue to follow with pc monitoring chronic disease progression, symptoms, monitor for decline, supportive role.    2. Goals of Care: Goals include to maximize quality of life and symptom management. Our advance  care planning conversation included a discussion about:    The value and importance of advance care planning  Exploration of personal, cultural or spiritual beliefs that might influence medical decisions  Exploration of goals of care in the event of a sudden injury or illness  Identification and preparation of a healthcare agent  Review and updating or creation of an advance directive document. 3. Palliative care encounter; Palliative care encounter; Palliative medicine team will continue to support patient, patient's family, and medical team. Visit consisted of counseling and education dealing with the complex and emotionally intense issues of symptom management and palliative care in the setting of serious and potentially life-threatening illness   4. Vascular Dementia currently appears stable, reviewed weights, stable,  05/18/2022 weight 185 lbs (per notes at this time gained 30lbs/4 months due to excessive calories)   Follow up Palliative Care Visit: Palliative care will continue to follow for complex medical decision making, advance care planning, and clarification of goals. Return 4 to 8 weeks or prn.   I spent 45 minutes providing this consultation starting at 1:10 pm. More than 50% of the time in this consultation was spent in counseling and care coordination PPS: 40% Chief Complaint: Follow up palliative consult for complex medical decision making, address goals, manage ongoing symptoms   HISTORY OF PRESENT  ILLNESS:  TYREKA SZELIGA is a 72 y.o. year old female  with multiple medical problems including Vascular Dementia, dysphagia, PVD, HTN, h/o DVT, CAD, hepatic cirrhosis, hepatitis C, DM, OA, CKD, vitamin D deficiency, HLD, chronic pain, depression, anxiety. Ms Matsushita resides LTC at [facility]. Ms Freda transfers to w/c, requires assistance with ADL's including bathing, dressing, toileting. Ms Suver does feed herself with fair appetite, current weight 180.8 lbs which is a loss, though last  pc visit in 05/2022 noted a 30 lbs weight gain in 4 months due to excessive calorie intake which per staff has seemed to improve with food choices. Staff endorses currently being treated for influenza, continues to have a cough. No recent hospitalizations, wounds, falls. At present Ms Jerrett is sitting in the w/c in her room, appears comfortable.    History obtained from review of EMR, discussion with primary team, and interview with family, facility staff/caregiver and/or Ms. Charney.  I reviewed available labs, medications, imaging, studies and related documents from the EMR.  Records reviewed and summarized above.    Physical Exam: General: pleasant female ENMT: oral mucous membranes moist CV: S1S2, RRR Pulmonary: LCTA Neuro:  no generalized weakness Psych: non-anxious affect, A and O     Thank you for the opportunity to participate in the care of Ms. Denner. Please call our office at [phone] if we can be of additional assistance.   Adriyana Greenbaum Ihor Gully, NP

## 2022-09-20 ENCOUNTER — Encounter: Payer: Self-pay | Admitting: Nurse Practitioner

## 2022-09-20 ENCOUNTER — Non-Acute Institutional Stay: Payer: Medicare HMO | Admitting: Nurse Practitioner

## 2022-09-20 DIAGNOSIS — R5381 Other malaise: Secondary | ICD-10-CM

## 2022-09-20 DIAGNOSIS — F01518 Vascular dementia, unspecified severity, with other behavioral disturbance: Secondary | ICD-10-CM

## 2022-09-20 DIAGNOSIS — Z515 Encounter for palliative care: Secondary | ICD-10-CM

## 2022-09-20 NOTE — Progress Notes (Signed)
[facility] Consult Note Telephone: [phone]  Fax: [phone]    Date of encounter: 09/20/22 3:12 PM PATIENT NAME: Yesenia Shepard [address] [place] [address]   [phone] (home)  DOB: [birth year] MRN: [id number] PRIMARY CARE PROVIDER:   [facility] Cephas Darby, [place],  [address] [place] [address] [phone]  RESPONSIBLE PARTY:    Contact Information     Name Relation Home Work Mobile   [place] [phone] Sister   [phone]   Smith,Robin Sister   [phone]   Strausbaugh,Bill Relative   [phone]     I met face to face with patient in facility. Palliative Care was asked to follow this patient by consultation request of  [facility] to address advance care planning and complex medical decision making. This is a follow up visit.                                  ASSESSMENT AND PLAN / RECOMMENDATIONS:  Symptom Management/Plan: 1. Advance Care Planning;  DNR   2. Goals of Care: Goals include to maximize quality of life and symptom management. Our advance care planning conversation included a discussion about:    The value and importance of advance care planning  Exploration of personal, cultural or spiritual beliefs that might influence medical decisions  Exploration of goals of care in the event of a sudden injury or illness  Identification and preparation of a healthcare agent  Review and updating or creation of an advance directive document. 3. Palliative care encounter; Palliative care encounter; Palliative medicine team will continue to support patient, patient's family, and medical team. Visit consisted of counseling and education dealing with the complex and emotionally intense issues of symptom management and palliative care in the setting of serious and potentially life-threatening illness   4. Vascular Dementia currently appears stable, reviewed weights, stable, Ongoing  monitoring, following for chronic disease, supportive role. Encourage oob,    Follow up Palliative Care Visit: Palliative care will continue to follow for complex medical decision making, advance care planning, and clarification of goals. Return 4 to 8 weeks or prn.   I spent 46 minutes providing this consultation. More than 50% of the time in this consultation was spent in counseling and care coordination PPS: 40% Chief Complaint: Follow up palliative consult for complex medical decision making, address goals, manage ongoing symptoms   HISTORY OF PRESENT ILLNESS:  Yesenia Shepard is a 72 y.o. year old female  with multiple medical problems including Vascular Dementia, dysphagia, PVD, HTN, h/o DVT, CAD, hepatic cirrhosis, hepatitis C, DM, OA, CKD, vitamin D deficiency, HLD, chronic pain, depression, anxiety. Yesenia Shepard resides LTC at [facility]. Yesenia Shepard transfers to w/c, requires assistance with ADL's including bathing, dressing, toileting. Yesenia Shepard does feed herself with fair appetite. No recent hospitalizations, wounds, falls. At present Yesenia Shepard is sitting in the w/c in her room, appears comfortable. Yesenia Shepard appears comfortable, no visitors present. I visited and observed Yesenia Shepard. Yesenia Shepard was lying in bed, appears comfortable. Yesenia Shepard did make eye contact, engaging. Yesenia Shepard was cooperative with assessment, supportive role, ros, medications, poc, goc reviewed. Attempted to reach [facility] dtg for update. Will continue current poc.    History obtained from review of EMR, discussion with primary team, and interview with family, facility staff/caregiver and/or Yesenia Shepard.  I reviewed available labs,  medications, imaging, studies and related documents from the EMR.  Records reviewed and summarized above.    Physical Exam: General: pleasant female ENMT: oral mucous membranes moist CV: S1S2, RRR Pulmonary: LCTA Neuro:  no generalized weakness Psych: non-anxious affect, Alert Thank you for the  opportunity to participate in the care of Yesenia Shepard. Please call our office at [phone] if we can be of additional assistance.   Yesenia Bobby Ihor Gully, NP

## 2022-10-15 ENCOUNTER — Non-Acute Institutional Stay: Payer: Medicare HMO | Admitting: Nurse Practitioner

## 2022-10-15 ENCOUNTER — Encounter: Payer: Self-pay | Admitting: Nurse Practitioner

## 2022-10-15 DIAGNOSIS — R5381 Other malaise: Secondary | ICD-10-CM

## 2022-10-15 DIAGNOSIS — F01518 Vascular dementia, unspecified severity, with other behavioral disturbance: Secondary | ICD-10-CM

## 2022-10-15 NOTE — Progress Notes (Signed)
Therapist, nutritional Palliative Care Consult Note Telephone: [phone]  Fax: [phone]    Date of encounter: 10/15/22 3:30 PM PATIENT NAME: Yesenia Shepard [address] [name] [address]   [phone] (home)  DOB: [birth year] MRN: [id number] PRIMARY CARE PROVIDER:    [facility]  RESPONSIBLE PARTY:    Contact Information     Name Relation Home Work Mobile   [place] [phone] Sister   [phone]   Smith,Robin Sister   [phone]   Strausbaugh,Bill Relative   [phone]         I met face to face with patient in facility. Palliative Care was asked to follow this patient by consultation request of  [facility] to address advance care planning and complex medical decision making. This is a follow up visit.                                  ASSESSMENT AND PLAN / RECOMMENDATIONS:  Symptom Management/Plan: 1. Advance Care Planning;  DNR   2. Palliative care encounter; Palliative care encounter; Palliative medicine team will continue to support patient, patient's family, and medical team. Visit consisted of counseling and education dealing with the complex and emotionally intense issues of symptom management and palliative care in the setting of serious and potentially life-threatening illness   3. Vascular Dementia stable, reviewed weights, stable, Ongoing monitoring, following for chronic disease, supportive role. Encourage oob,    Follow up Palliative Care Visit: Palliative care will continue to follow for complex medical decision making, advance care planning, and clarification of goals. Return 4 to 8 weeks or prn.   I spent 45 minutes providing this consultation. More than 50% of the time in this consultation was spent in counseling and care coordination PPS: 40% Chief Complaint: Follow up palliative consult for complex medical decision making, address goals, manage ongoing symptoms   HISTORY OF PRESENT ILLNESS:  Yesenia  R Shepard is a 72 y.o. year old female  with multiple medical problems including Vascular Dementia, dysphagia, PVD, HTN, h/o DVT, CAD, hepatic cirrhosis, hepatitis C, DM, OA, CKD, vitamin D deficiency, HLD, chronic pain, depression, anxiety. Yesenia Shepard resides LTC at [facility]. Yesenia Shepard transfers to w/c, requires assistance with ADL's including bathing, dressing, toileting. Yesenia Shepard does feed herself with fair appetite. No recent hospitalizations, wounds, falls. Purpose of today PC f/u visit further discussion monitor trends of appetite, weights, monitor for functional, cognitive decline with chronic disease progression, assess any active symptoms, supportive role.At present Yesenia Shepard is sitting in the w/c in her room, appears comfortable. Yesenia Shepard appears comfortable, no visitors present. I visited and observed Yesenia Shepard. Yesenia Shepard was sitting in w/c in her room. Yesenia Shepard did make eye contact, engaging answering simple questions, ros, functional ability, what brings her joy, appetite, foods she likes, daily activities. Yesenia Shepard was cooperative with assessment, supportive role, medications, poc, goc reviewed. Attempted to reach [facility] dtg for update. PC f/u visit further discussion monitor trends of appetite, weights, monitor for functional, cognitive decline with chronic disease progression, assess any active symptoms, supportive role.    History obtained from review of EMR, discussion with primary team, and interview with family, facility staff/caregiver and/or Yesenia Shepard.  I reviewed available labs, medications, imaging, studies and related documents from the EMR.  Records reviewed and summarized above.    Physical Exam: General: pleasant female ENMT: oral mucous  membranes moist CV: S1S2, RRR Pulmonary: LCTA Neuro:  no generalized weakness Psych: non-anxious affect, Alert, oriented to self  Thank you for the opportunity to participate in the care of Yesenia Shepard. Please call our office at [phone] if  we can be of additional assistance.   Marilyn Nihiser Prince [place], NP

## 2022-11-22 ENCOUNTER — Non-Acute Institutional Stay: Payer: Medicare HMO | Admitting: Nurse Practitioner

## 2022-11-22 DIAGNOSIS — R5381 Other malaise: Secondary | ICD-10-CM

## 2022-11-22 DIAGNOSIS — Z515 Encounter for palliative care: Secondary | ICD-10-CM

## 2022-11-22 DIAGNOSIS — F01518 Vascular dementia, unspecified severity, with other behavioral disturbance: Secondary | ICD-10-CM

## 2022-11-22 NOTE — Progress Notes (Addendum)
Therapist, nutritional Palliative Care Consult Note Telephone: [phone]  Fax: [phone]    Date of encounter: 11/22/22 8:42 PM PATIENT NAME: Yesenia Shepard [address] [place] [address]   [phone] (home)  DOB: [birth year] MRN: [id number] PRIMARY CARE PROVIDER:   [facility] [place], [place],  [address] [place] [address] [phone]  RESPONSIBLE PARTY:    Contact Information     Name Relation Home Work Mobile   [place] [phone] Sister   [phone]   Smith,Robin Sister   [phone]   Strausbaugh,Bill Relative   [phone]         I met face to face with patient in facility. Palliative Care was asked to follow this patient by consultation request of  [facility] to address advance care planning and complex medical decision making. This is a follow up visit.                                  ASSESSMENT AND PLAN / RECOMMENDATIONS:  Symptom Management/Plan: 1. Advance Care Planning;  DNR   2. Palliative care encounter; Palliative care encounter; Palliative medicine team will continue to support patient, patient's family, and medical team. Visit consisted of counseling and education dealing with the complex and emotionally intense issues of symptom management and palliative care in the setting of serious and potentially life-threatening illness   3. Vascular Dementia stable, reviewed weights, stable, Ongoing monitoring, following for chronic disease, supportive role. Encourage oob,    Follow up Palliative Care Visit: Palliative care will continue to follow for complex medical decision making, advance care planning, and clarification of goals. Return 2 to 8 weeks or prn.   I spent 35 minutes providing this consultation. More than 50% of the time in this consultation was spent in counseling and care coordination PPS: 40% Chief Complaint: Follow up palliative consult for complex medical decision making, address  goals, manage ongoing symptoms   HISTORY OF PRESENT ILLNESS:  Yesenia Shepard is a 72 y.o. year old female  with multiple medical problems including Vascular Dementia, dysphagia, PVD, HTN, h/o DVT, CAD, hepatic cirrhosis, hepatitis C, DM, OA, CKD, vitamin D deficiency, HLD, chronic pain, depression, anxiety. Ms Nerby resides LTC at [facility]. Ms Kornbluth transfers to w/c, requires assistance with ADL's including bathing, dressing, toileting. Ms Wagenblast does feed herself with fair appetite. No recent hospitalizations, wounds, falls. Purpose of today PC f/u visit further discussion monitor trends of appetite, weights, monitor for functional, cognitive decline with chronic disease progression, assess any active symptoms, supportive role.At present Ms Pollard is sitting in the w/c in her room, appears comfortable. Ms Odell appears comfortable, no visitors present. I visited and observed Ms Barman. Ms Mattioli was sitting in w/c in her room. Ms Pollinger was interactive, engaging though limited with ability to answer more difficult questions. Ms Copple and I talked about ros, daily routine, her functional ability, the pictures in her room. We talked about quality of life, residing at facility, appetite, weights. We talked about role pc in poc. Most of pc visit supportive as Ms Williamsen is currently stable. Ms Pandey was cooperative with assessment, supportive role, medications, poc, goc reviewed. Attempted to reach [facility] dtg for update. PC f/u visit further discussion monitor trends of appetite, weights, monitor for functional, cognitive decline with chronic disease progression, assess any active symptoms, supportive role.    History obtained from  review of EMR, discussion with primary team, and interview with family, facility staff/caregiver and/or Ms. Braggs.  I reviewed available labs, medications, imaging, studies and related documents from the EMR.  Records reviewed and summarized above.    Physical Exam: General: pleasant  female ENMT: oral mucous membranes moist CV: S1S2, RRR Pulmonary: breath sounds clear Neuro:  no generalized weakness Psych: non-anxious affect, Alert, oriented to self  Thank you for the opportunity to participate in the care of Ms. Cypert. Please call our office at [phone] if we can be of additional assistance.   Merari Pion [place], NP

## 2022-12-17 ENCOUNTER — Other Ambulatory Visit: Payer: Self-pay

## 2022-12-17 ENCOUNTER — Emergency Department: Payer: Medicare HMO

## 2022-12-17 ENCOUNTER — Inpatient Hospital Stay
Admission: EM | Admit: 2022-12-17 | Discharge: 2022-12-20 | DRG: 536 | Disposition: A | Discharge status: Skilled Nursing Facility | Payer: Medicare HMO | Source: Skilled Nursing Facility | Attending: Internal Medicine | Admitting: Internal Medicine

## 2022-12-17 ENCOUNTER — Encounter: Payer: Self-pay | Admitting: Emergency Medicine

## 2022-12-17 DIAGNOSIS — F0154 Vascular dementia, unspecified severity, with anxiety: Secondary | ICD-10-CM | POA: Diagnosis present

## 2022-12-17 DIAGNOSIS — N179 Acute kidney failure, unspecified: Secondary | ICD-10-CM | POA: Insufficient documentation

## 2022-12-17 DIAGNOSIS — Z993 Dependence on wheelchair: Secondary | ICD-10-CM | POA: Diagnosis not present

## 2022-12-17 DIAGNOSIS — F32A Depression, unspecified: Secondary | ICD-10-CM | POA: Diagnosis present

## 2022-12-17 DIAGNOSIS — I2510 Atherosclerotic heart disease of native coronary artery without angina pectoris: Secondary | ICD-10-CM | POA: Diagnosis present

## 2022-12-17 DIAGNOSIS — Z87891 Personal history of nicotine dependence: Secondary | ICD-10-CM | POA: Diagnosis not present

## 2022-12-17 DIAGNOSIS — E1151 Type 2 diabetes mellitus with diabetic peripheral angiopathy without gangrene: Secondary | ICD-10-CM | POA: Diagnosis present

## 2022-12-17 DIAGNOSIS — Z90710 Acquired absence of both cervix and uterus: Secondary | ICD-10-CM

## 2022-12-17 DIAGNOSIS — I82409 Acute embolism and thrombosis of unspecified deep veins of unspecified lower extremity: Secondary | ICD-10-CM | POA: Diagnosis present

## 2022-12-17 DIAGNOSIS — N184 Chronic kidney disease, stage 4 (severe): Secondary | ICD-10-CM | POA: Diagnosis present

## 2022-12-17 DIAGNOSIS — Z9049 Acquired absence of other specified parts of digestive tract: Secondary | ICD-10-CM

## 2022-12-17 DIAGNOSIS — I129 Hypertensive chronic kidney disease with stage 1 through stage 4 chronic kidney disease, or unspecified chronic kidney disease: Secondary | ICD-10-CM | POA: Diagnosis present

## 2022-12-17 DIAGNOSIS — E1122 Type 2 diabetes mellitus with diabetic chronic kidney disease: Secondary | ICD-10-CM | POA: Diagnosis present

## 2022-12-17 DIAGNOSIS — Z794 Long term (current) use of insulin: Secondary | ICD-10-CM | POA: Diagnosis not present

## 2022-12-17 DIAGNOSIS — Z8249 Family history of ischemic heart disease and other diseases of the circulatory system: Secondary | ICD-10-CM

## 2022-12-17 DIAGNOSIS — W19XXXA Unspecified fall, initial encounter: Secondary | ICD-10-CM | POA: Diagnosis present

## 2022-12-17 DIAGNOSIS — Z86718 Personal history of other venous thrombosis and embolism: Secondary | ICD-10-CM

## 2022-12-17 DIAGNOSIS — Z66 Do not resuscitate: Secondary | ICD-10-CM | POA: Diagnosis present

## 2022-12-17 DIAGNOSIS — Z833 Family history of diabetes mellitus: Secondary | ICD-10-CM | POA: Diagnosis not present

## 2022-12-17 DIAGNOSIS — Z89422 Acquired absence of other left toe(s): Secondary | ICD-10-CM

## 2022-12-17 DIAGNOSIS — E785 Hyperlipidemia, unspecified: Secondary | ICD-10-CM | POA: Diagnosis present

## 2022-12-17 DIAGNOSIS — Z634 Disappearance and death of family member: Secondary | ICD-10-CM

## 2022-12-17 DIAGNOSIS — K7460 Unspecified cirrhosis of liver: Secondary | ICD-10-CM | POA: Diagnosis present

## 2022-12-17 DIAGNOSIS — Z87440 Personal history of urinary (tract) infections: Secondary | ICD-10-CM

## 2022-12-17 DIAGNOSIS — Z818 Family history of other mental and behavioral disorders: Secondary | ICD-10-CM

## 2022-12-17 DIAGNOSIS — F0153 Vascular dementia, unspecified severity, with mood disturbance: Secondary | ICD-10-CM | POA: Diagnosis present

## 2022-12-17 DIAGNOSIS — Z79899 Other long term (current) drug therapy: Secondary | ICD-10-CM | POA: Diagnosis not present

## 2022-12-17 DIAGNOSIS — I10 Essential (primary) hypertension: Secondary | ICD-10-CM | POA: Diagnosis present

## 2022-12-17 DIAGNOSIS — S72402A Unspecified fracture of lower end of left femur, initial encounter for closed fracture: Principal | ICD-10-CM

## 2022-12-17 DIAGNOSIS — Z7901 Long term (current) use of anticoagulants: Secondary | ICD-10-CM | POA: Diagnosis not present

## 2022-12-17 DIAGNOSIS — E1129 Type 2 diabetes mellitus with other diabetic kidney complication: Secondary | ICD-10-CM | POA: Diagnosis present

## 2022-12-17 DIAGNOSIS — F419 Anxiety disorder, unspecified: Secondary | ICD-10-CM | POA: Diagnosis present

## 2022-12-17 DIAGNOSIS — S72002A Fracture of unspecified part of neck of left femur, initial encounter for closed fracture: Principal | ICD-10-CM | POA: Diagnosis present

## 2022-12-17 LAB — CBC WITH DIFFERENTIAL/PLATELET
Abs Immature Granulocytes: 0.02 10*3/uL (ref 0.00–0.07)
Basophils Absolute: 0 10*3/uL (ref 0.0–0.1)
Basophils Relative: 0 %
Eosinophils Absolute: 0.3 10*3/uL (ref 0.0–0.5)
Eosinophils Relative: 3 %
HCT: 33.4 % — ABNORMAL LOW (ref 36.0–46.0)
Hemoglobin: 10.4 g/dL — ABNORMAL LOW (ref 12.0–15.0)
Immature Granulocytes: 0 %
Lymphocytes Relative: 20 %
Lymphs Abs: 1.8 10*3/uL (ref 0.7–4.0)
MCH: 28.2 pg (ref 26.0–34.0)
MCHC: 31.1 g/dL (ref 30.0–36.0)
MCV: 90.5 fL (ref 80.0–100.0)
Monocytes Absolute: 0.7 10*3/uL (ref 0.1–1.0)
Monocytes Relative: 8 %
Neutro Abs: 6 10*3/uL (ref 1.7–7.7)
Neutrophils Relative %: 69 %
Platelets: 310 10*3/uL (ref 150–400)
RBC: 3.69 MIL/uL — ABNORMAL LOW (ref 3.87–5.11)
RDW: 12.9 % (ref 11.5–15.5)
WBC: 8.7 10*3/uL (ref 4.0–10.5)
nRBC: 0 % (ref 0.0–0.2)

## 2022-12-17 LAB — BASIC METABOLIC PANEL
Anion gap: 9 (ref 5–15)
BUN: 23 mg/dL (ref 8–23)
CO2: 23 mmol/L (ref 22–32)
Calcium: 7.9 mg/dL — ABNORMAL LOW (ref 8.9–10.3)
Chloride: 109 mmol/L (ref 98–111)
Creatinine, Ser: 1.56 mg/dL — ABNORMAL HIGH (ref 0.44–1.00)
GFR, Estimated: 35 mL/min — ABNORMAL LOW (ref >60)
Glucose, Bld: 108 mg/dL — ABNORMAL HIGH (ref 70–99)
Potassium: 4 mmol/L (ref 3.5–5.1)
Sodium: 141 mmol/L (ref 135–145)

## 2022-12-17 LAB — GLUCOSE, CAPILLARY: Glucose-Capillary: 183 mg/dL — ABNORMAL HIGH (ref 70–99)

## 2022-12-17 MED ORDER — FENTANYL CITRATE PF 50 MCG/ML IJ SOSY: 12.5000 ug | PREFILLED_SYRINGE | INTRAMUSCULAR | Status: DC | PRN

## 2022-12-17 MED ORDER — MELATONIN 5 MG PO TABS
5.0000 mg | ORAL_TABLET | Freq: Every evening | ORAL | Status: DC | PRN
  Administered 2022-12-19: 5 mg via ORAL
  Filled 2022-12-17: qty 1

## 2022-12-17 MED ORDER — HYDRALAZINE HCL 10 MG PO TABS
10.0000 mg | ORAL_TABLET | Freq: Four times a day (QID) | ORAL | Status: DC | PRN
  Filled 2022-12-17: qty 1

## 2022-12-17 MED ORDER — INSULIN ASPART 100 UNIT/ML IJ SOLN
0.0000 [IU] | Freq: Three times a day (TID) | INTRAMUSCULAR | Status: DC
  Administered 2022-12-18: 2 [IU] via SUBCUTANEOUS
  Administered 2022-12-19: 1 [IU] via SUBCUTANEOUS
  Administered 2022-12-19: 2 [IU] via SUBCUTANEOUS
  Administered 2022-12-19 – 2022-12-20 (×2): 1 [IU] via SUBCUTANEOUS
  Filled 2022-12-17 (×5): qty 1

## 2022-12-17 MED ORDER — SODIUM CHLORIDE 0.9 % IV SOLN: INTRAVENOUS | Status: AC

## 2022-12-17 MED ORDER — FENTANYL CITRATE PF 50 MCG/ML IJ SOSY
50.0000 ug | PREFILLED_SYRINGE | Freq: Once | INTRAMUSCULAR | Status: AC
  Administered 2022-12-17: 50 ug via INTRAVENOUS
  Filled 2022-12-17: qty 1

## 2022-12-17 MED ORDER — MORPHINE SULFATE (PF) 2 MG/ML IV SOLN
2.0000 mg | INTRAVENOUS | Status: AC | PRN
  Administered 2022-12-17 – 2022-12-18 (×3): 2 mg via INTRAVENOUS
  Filled 2022-12-17 (×4): qty 1

## 2022-12-17 MED ORDER — HEPARIN SODIUM (PORCINE) 5000 UNIT/ML IJ SOLN
5000.0000 [IU] | Freq: Three times a day (TID) | INTRAMUSCULAR | Status: DC
  Administered 2022-12-18 – 2022-12-20 (×7): 5000 [IU] via SUBCUTANEOUS
  Filled 2022-12-17 (×7): qty 1

## 2022-12-17 MED ORDER — HYDROMORPHONE HCL 1 MG/ML IJ SOLN
0.5000 mg | Freq: Once | INTRAMUSCULAR | Status: AC
  Administered 2022-12-17: 0.5 mg via INTRAVENOUS
  Filled 2022-12-17: qty 0.5

## 2022-12-17 MED ORDER — SENNOSIDES-DOCUSATE SODIUM 8.6-50 MG PO TABS: 1.0000 | ORAL_TABLET | Freq: Every evening | ORAL | Status: DC | PRN

## 2022-12-17 MED ORDER — INSULIN ASPART 100 UNIT/ML IJ SOLN: 0.0000 [IU] | Freq: Every day | INTRAMUSCULAR | Status: DC

## 2022-12-17 MED ORDER — FERROUS SULFATE 325 (65 FE) MG PO TABS
325.0000 mg | ORAL_TABLET | Freq: Two times a day (BID) | ORAL | Status: DC
  Administered 2022-12-17 – 2022-12-20 (×6): 325 mg via ORAL
  Filled 2022-12-17 (×6): qty 1

## 2022-12-17 MED ORDER — HYDROCODONE-ACETAMINOPHEN 5-325 MG PO TABS
1.0000 | ORAL_TABLET | Freq: Four times a day (QID) | ORAL | Status: DC | PRN
  Administered 2022-12-18: 1 via ORAL
  Filled 2022-12-17 (×2): qty 1

## 2022-12-17 NOTE — Hospital Course (Addendum)
Ms. Yesenia Shepard is a 72 year old female with history of hypertension, neuropathy, insulin-dependent diabetes mellitus, vascular dementia, history of CAD, DVT on Eliquis, depression, with anxiety, who presents to the emergency department for chief concerns of fall with COVID or inability to bear weight.  Vitals in the ED showed temperature of 98.4, respiration rate of 20, heart rate of 73, blood pressure 168/108, SpO2 99% on 2 L nasal cannula.  Serum sodium is 141, potassium 4.0, chloride 109, bicarb 23, BUN of 23, serum creatinine 1.56, EGFR 35, nonfasting glucose 108, WBC 8.7, hemoglobin 10.4, platelets of 09/13/2008.  ED treatment: Fentanyl 50 micrograms one-time dose, Dilaudid 2.5 mg IV one-time dose.

## 2022-12-17 NOTE — Assessment & Plan Note (Signed)
No CKD Sodium chloride infusion at 125 mL/h, 1 day ordered BMP in the a.m.

## 2022-12-17 NOTE — ED Triage Notes (Signed)
Arrives via [facility] from [facility].  Fall at 0100 while walking to the bathroom.  Hit knee, had xray done.  Results show a distal femur and knee fx, left side.  Swelling to leg.  Unable to bear weight.  100 mcg fentanyl by EMS.  22g left hand  Vs wnl, htn.

## 2022-12-17 NOTE — ED Triage Notes (Signed)
From [facility].

## 2022-12-17 NOTE — ED Provider Notes (Signed)
[facility] Provider Note    Event Date/Time   First MD Initiated Contact with Patient 12/17/22 1606     (approximate)   History   Fall and Knee Pain   HPI  Yesenia Shepard is a 72 y.o. female with a history of vascular dementia, hypertension, hyperlipidemia, diabetes, GERD, UTI, PVD, CKD, liver cirrhosis, DVT on Eliquis depression, and anxiety who presents with left knee injury after fall while the patient was transferring from chair to bed.  The patient denies hitting her head and denies any other injuries.  She reports pain to the upper part of the left knee.  She has not been able to put weight on it.  I reviewed the past medical records.  The patient was evaluated by palliative care on 5/16.  She has a history of vascular dementia.  At baseline she transfers to wheelchair, requires assistance with ADLs such as bathing, but is able to feed herself.   Physical Exam   Triage Vital Signs: ED Triage Vitals  Enc Vitals Group     BP 12/17/22 1322 (!) 140/72     Pulse Rate 12/17/22 1322 67     Resp 12/17/22 1322 14     Temp 12/17/22 1322 98 F (36.7 C)     Temp Source 12/17/22 1322 Oral     SpO2 12/17/22 1322 (!) 82 %     Weight 12/17/22 1323 180 lb (81.6 kg)     Height --      Head Circumference --      [facility] --      Pain Score 12/17/22 1323 10     Pain Loc --      Pain Edu? --      Excl. in GC? --     Most recent vital signs: Vitals:   12/17/22 2100 12/17/22 2135  BP: 129/66 (!) 155/66  Pulse: 71 68  Resp: 20 18  Temp:  97.9 F (36.6 C)  SpO2: 95% 95%     General: Alert and oriented, comfortable appearing, no distress.  CV:  Good peripheral perfusion.  Resp:  Normal effort.  Abd:  No distention.  Other:  Left knee superior tenderness with no deformity.  Pain on any range of motion.  2+ DP pulse.  Normal cap refill.  Motor and sensory intact distally.   ED Results / Procedures / Treatments   Labs (all labs ordered are listed, but  only abnormal results are displayed) Labs Reviewed  BASIC METABOLIC PANEL - Abnormal; Notable for the following components:      Result Value   Glucose, Bld 108 (*)    Creatinine, Ser 1.56 (*)    Calcium 7.9 (*)    GFR, Estimated 35 (*)    All other components within normal limits  CBC WITH DIFFERENTIAL/PLATELET - Abnormal; Notable for the following components:   RBC 3.69 (*)    Hemoglobin 10.4 (*)    HCT 33.4 (*)    All other components within normal limits  GLUCOSE, CAPILLARY - Abnormal; Notable for the following components:   Glucose-Capillary 183 (*)    All other components within normal limits  CBC  BASIC METABOLIC PANEL  HEMOGLOBIN A1C     EKG     RADIOLOGY  XR left tibia/fibula: I independently viewed and interpreted the images; there is a distal femur fracture XR L femur: Distal medial femoral condyle fracture XR pelvis: No acute fracture  CXR: No acute abnormality  CT head:  IMPRESSION:  No acute intracranial abnormality.      PROCEDURES:  Critical Care performed: No  Procedures   MEDICATIONS ORDERED IN ED: Medications  HYDROcodone-acetaminophen (NORCO/VICODIN) 5-325 MG per tablet 1 tablet (has no administration in time range)  morphine (PF) 2 MG/ML injection 2 mg (2 mg Intravenous Given 12/17/22 2202)  heparin injection 5,000 Units (has no administration in time range)  fentaNYL (SUBLIMAZE) injection 12.5 mcg (has no administration in time range)  senna-docusate (Senokot-S) tablet 1 tablet (has no administration in time range)  insulin aspart (novoLOG) injection 0-9 Units (has no administration in time range)  insulin aspart (novoLOG) injection 0-5 Units ( Subcutaneous Not Given 12/17/22 2209)  0.9 %  sodium chloride infusion ( Intravenous New Bag/Given 12/17/22 2203)  ferrous sulfate tablet 325 mg (325 mg Oral Given 12/17/22 2203)  melatonin tablet 5 mg (has no administration in time range)  hydrALAZINE (APRESOLINE) tablet 10 mg (has no  administration in time range)  fentaNYL (SUBLIMAZE) injection 50 mcg (50 mcg Intravenous Given 12/17/22 1717)  HYDROmorphone (DILAUDID) injection 0.5 mg (0.5 mg Intravenous Given 12/17/22 1956)     IMPRESSION / MDM / ASSESSMENT AND PLAN / ED COURSE  I reviewed the triage vital signs and the nursing notes.  72 year old female with PMH as noted above presents with left knee pain after a mechanical fall while transferring from bed to chair.  She denies other injuries.  CT head was obtained from triage which is negative.  X-rays reveal a distal left femur fracture.  The limb is neuro/vascular intact on exam.  Differential diagnosis includes, but is not limited to, fracture.  Patient's presentation is most consistent with acute presentation with potential threat to life or bodily function.  ----------------------------------------- 8:37 PM on 12/17/2022 -----------------------------------------  BMP shows no Kniffin electrolyte abnormalities.  CBC shows chronic anemia with no acute findings.    I consulted and discussed the case with Dr. Allena Katz from orthopedics who reviewed the imaging.  He advises that the fracture should be treated nonoperatively with a knee immobilizer.  Due to the patient's degree of pain she will need admission for pain management and PT.  I consulted Dr. Sedalia Muta from the hospitalist service; based on our discussion she agrees to evaluate the patient for admission.   FINAL CLINICAL IMPRESSION(S) / ED DIAGNOSES   Final diagnoses:  Closed fracture of distal end of left femur, unspecified fracture morphology, initial encounter (HCC)     Rx / DC Orders   ED Discharge Orders     None        Note:  This document was prepared using Dragon voice recognition software and may include unintentional dictation errors.    Dionne Bucy, MD 12/17/22 [phone]

## 2022-12-17 NOTE — ED Provider Triage Note (Signed)
Emergency Medicine Provider Triage Evaluation Note  Yesenia Shepard , a 72 y.o. female  was evaluated in triage.  Pt complains of fall, last night, pain in left hip, left knee, and left lower leg, is on blood thinners.  Review of Systems  Positive:  Negative:   Physical Exam  BP (!) 140/72   Pulse 67   Temp 98 F (36.7 C) (Oral)   Resp 14   Wt 81.6 kg   SpO2 (!) 82%   BMI 29.95 kg/m  Gen:   Awake, no distress   Resp:  Normal effort  MSK: left hip left knee and left tibia, cspine nontender Other:    Medical Decision Making  Medically screening exam initiated at 1:28 PM.  Appropriate orders placed.  Yesenia Shepard was informed that the remainder of the evaluation will be completed by another provider, this initial triage assessment does not replace that evaluation, and the importance of remaining in the ED until their evaluation is complete.  Ct head due to fall and blood thinners Xrays ordered   Faythe Ghee, PA-C 12/17/22 1329

## 2022-12-17 NOTE — Assessment & Plan Note (Addendum)
Home Eliquis not resumed on admission due to moderate hemorrhagic effusion of the right lower extremity

## 2022-12-17 NOTE — ED Notes (Signed)
Left knee immobilizer placed on patient. Patient reports pain to left knee. Dr. Marisa Severin notified.

## 2022-12-17 NOTE — Assessment & Plan Note (Addendum)
With moderate-sized hemorrhagic effusion Per EDP, orthopedic surgery was consulted and Dr. Allena Katz states that the left femoral mildly displaced fracture is not operative and recommends a left knee immobilizer. Symptomatic support: Oxycodone 5-325 mg tablet, p.o. every 6 hours as needed for moderate pain, 1 day ordered; morphine 2 mg IV every 4 hours as needed for severe pain, 20 hours ordered Sodium chloride infusion at 125 mL/h, 1 day ordered Holding home Eliquis due to moderate-sized hemorrhagic effusion at the left lower extremity AM team to resume home Eliquis when the benefits outweigh the risk

## 2022-12-17 NOTE — H&P (Addendum)
History and Physical   Yesenia Shepard WUJ:[id number] DOB: Sep 17, 1950 DOA: 12/17/2022  PCP: Wardell Honour, FNP  Outpatient Specialists: Dr. Myer Haff, neurosurgery Patient coming from: [facility] via EMS  I have personally briefly reviewed patient's old medical records in [facility] EMR.  Chief Concern: fall, left leg pain  HPI: Ms. Yesenia Shepard is a 72 year old female with history of hypertension, neuropathy, insulin-dependent diabetes mellitus, vascular dementia, history of CAD, DVT on Eliquis, depression, with anxiety, who presents to the emergency department for chief concerns of fall with COVID or inability to bear weight.  Vitals in the ED showed temperature of 98.4, respiration rate of 20, heart rate of 73, blood pressure 168/108, SpO2 99% on 2 L nasal cannula.  Serum sodium is 141, potassium 4.0, chloride 109, bicarb 23, BUN of 23, serum creatinine 1.56, EGFR 35, nonfasting glucose 108, WBC 8.7, hemoglobin 10.4, platelets of 09/13/2008.  ED treatment: Fentanyl 50 micrograms one-time dose, Dilaudid 2.5 mg IV one-time dose. ---------------------------- At bedside, patient was able to tell me her name, age, location, current calendar year.  She does not appear to be in acute distress.  She states that she was in the process of transferring from her bed to her chair when she fell and has been having difficulty bearing weight since then.  She reports the pain is 10 out of 10.  She denies head trauma, loss of consciousness, syncope.  She denies chest pain, abdominal pain, dysuria, hematuria, diarrhea, shortness of breath.  Social history: Patient current lives at [facility].  ROS: Constitutional: no weight change, no fever ENT/Mouth: no sore throat, no rhinorrhea Eyes: no eye pain, no vision changes Cardiovascular: no chest pain, no dyspnea,  no edema, no palpitations Respiratory: no cough, no sputum, no wheezing Gastrointestinal: no nausea, no vomiting, no diarrhea, no  constipation Genitourinary: no urinary incontinence, no dysuria, no hematuria Musculoskeletal: no arthralgias, no myalgias, left leg pain Skin: no skin lesions, no pruritus, Neuro: + weakness, no loss of consciousness, no syncope Psych: no anxiety, no depression, no decrease appetite Heme/Lymph: no bruising, no bleeding  ED Course: Discussed with ED provider, patient requiring hospitalization for chief concern of left leg pain.  Assessment/Plan  Principal Problem:   Left displaced femoral neck fracture (HCC) Active Problems:   Anxiety and depression   Essential hypertension   HLD (hyperlipidemia)   Type II diabetes mellitus with renal manifestations (HCC)   DVT (deep venous thrombosis) (HCC)   AKI (acute kidney injury) (HCC)   Assessment and Plan:  * Left displaced femoral neck fracture (HCC) With moderate-sized hemorrhagic effusion Per EDP, orthopedic surgery was consulted and Dr. Allena Katz states that the left femoral mildly displaced fracture is not operative and recommends a left knee immobilizer. Symptomatic support: Oxycodone 5-325 mg tablet, p.o. every 6 hours as needed for moderate pain, 1 day ordered; morphine 2 mg IV every 4 hours as needed for severe pain, 20 hours ordered Sodium chloride infusion at 125 mL/h, 1 day ordered Holding home Eliquis due to moderate-sized hemorrhagic effusion at the left lower extremity AM team to resume home Eliquis when the benefits outweigh the risk  AKI (acute kidney injury) (HCC) No CKD Sodium chloride infusion at 125 mL/h, 1 day ordered BMP in the a.m.  DVT (deep venous thrombosis) (HCC) Home Eliquis not resumed on admission due to moderate hemorrhagic effusion of the right lower extremity  Type II diabetes mellitus with renal manifestations (HCC) Insulin dependent Insulin SSI with agents coverage ordered  HLD (hyperlipidemia)  Home atorvastatin 40 mg daily  Essential hypertension Hydralazine 10 mg p.o. every 6 hours as needed  for SBP greater than 175, 4 days ordered  Chart reviewed.   AM team to complete med reconciliation.  DVT prophylaxis: Heparin 5000 units subcutaneous every 8 hours Code Status: Patient has DNR in ACP, however at bedside, she states that while hospitalized, she would like to be Full Code Diet: Heart healthy/carb modified Family Communication: no Disposition Plan: Pending clinical course Consults called: Orthopedic surgery via EDP Admission status: Telemetry medical, inpatient  Past Medical History:  Diagnosis Date   Anxiety    Arthritis    Chronic pain    Depression    Diabetes mellitus without complication (HCC)    Drug abuse (HCC)    History of polysubstance abuse; currently on methadone.   Hepatitis    History of Hep "C". treated and cured with Harvoni   History of chicken pox    Hypertension    Panic attacks    Peripheral vascular disease (HCC)    stent in place.    UTI (urinary tract infection)    History of   Past Surgical History:  Procedure Laterality Date   ABDOMINAL HYSTERECTOMY  1989   AMPUTATION TOE Left 10/26/2018   Procedure: 5TH RAY RESCTION;  Surgeon: Recardo Evangelist, DPM;  Location: [facility] OR[facility];  Service: Podiatry;  Laterality: Left;   APPLICATION OF WOUND VAC N/A 02/05/2019   Procedure: APPLICATION OF WOUND VAC POSS DEBRIDEMENT;  Surgeon: Duanne Guess, MD;  Location: [facility];  Service: General;  Laterality: N/A;   APPLICATION OF WOUND VAC  02/03/2019   Procedure: APPLICATION OF WOUND VAC;  Surgeon: Duanne Guess, MD;  Location: [facility];  Service: General;;   CHOLECYSTECTOMY  1987   INTRACAPSULAR CATARACT EXTRACTION Left    IRRIGATION AND DEBRIDEMENT ABSCESS N/A 02/03/2019   Procedure: IRRIGATION AND DEBRIDEMENT SACRAL DECUBITUS;  Surgeon: Duanne Guess, MD;  Location: [facility];  Service: General;  Laterality: N/A;   KNEE SURGERY Left    LOWER EXTREMITY ANGIOGRAPHY Left 12/17/2017   Procedure: LOWER EXTREMITY ANGIOGRAPHY;  Surgeon: Renford Dills, MD;  Location: [facility];  Service: Cardiovascular;  Laterality: Left;   LOWER EXTREMITY ANGIOGRAPHY Left 10/24/2018   Procedure: LOWER EXTREMITY ANGIOGRAPHY;  Surgeon: Annice Needy, MD;  Location: [facility];  Service: Cardiovascular;  Laterality: Left;   PERIPHERAL VASCULAR CATHETERIZATION Left 05/04/2015   Procedure: Lower Extremity Angiography;  Surgeon: Renford Dills, MD;  Location: [facility];  Service: Cardiovascular;  Laterality: Left;   PERIPHERAL VASCULAR CATHETERIZATION Left 05/04/2015   Procedure: Lower Extremity Intervention;  Surgeon: Renford Dills, MD;  Location: [facility];  Service: Cardiovascular;  Laterality: Left;   SHOULDER ARTHROSCOPY WITH OPEN ROTATOR CUFF REPAIR Right 01/16/2017   Procedure: SHOULDER ARTHROSCOPY WITH OPEN ROTATOR CUFF REPAIR;  Surgeon: Juanell Fairly, MD;  Location: [facility];  Service: Orthopedics;  Laterality: Right;   TONSILLECTOMY AND ADENOIDECTOMY  1971   Social History:  reports that she quit smoking about 4 years ago. Her smoking use included cigarettes. She has a 15.00 pack-year smoking history. She has never used smokeless tobacco. She reports that she does not currently use drugs. She reports that she does not drink alcohol.  No Known Allergies Family History  Problem Relation Age of Onset   Arthritis Mother    Mental illness Mother        depression   Diabetes Mother    Cancer Mother  pancreatic   Cancer Father        colon   Heart disease Father    Diabetes Father    Cancer Daughter        lung   Family history: Family history reviewed and not pertinent.  Prior to Admission medications   Medication Sig Start Date End Date Taking? Authorizing Provider  acetaminophen (TYLENOL) 325 MG tablet Take 2 tablets (650 mg total) by mouth every 6 (six) hours as needed for mild pain (or Fever >/= 101). 11/03/18   Stegmayer, Cala Bradford A, PA-C  apixaban (ELIQUIS) 5 MG TABS tablet  Take 5 mg by mouth 2 (two) times daily.    [provider]  ARIPiprazole (ABILIFY) 10 MG tablet Take 10 mg by mouth at bedtime.    [provider]  atorvastatin (LIPITOR) 40 MG tablet Take 1 tablet (40 mg total) by mouth daily. Patient taking differently: Take 40 mg by mouth every evening. 04/10/18   [name], Pasty Spillers, MD  cholecalciferol (VITAMIN D3) 25 MCG (1000 UT) tablet Take 1 tablet (1,000 Units total) by mouth daily. 11/04/18   Stegmayer, Cala Bradford A, PA-C  desvenlafaxine (PRISTIQ) 25 MG 24 hr tablet Take 25 mg by mouth in the morning.    [provider]  famotidine (PEPCID) 20 MG tablet Take 1 tablet (20 mg total) by mouth daily. 02/12/19   Enid Baas Jude, MD  feeding supplement, GLUCERNA SHAKE, (GLUCERNA SHAKE) LIQD Take 237 mLs by mouth 2 (two) times daily between meals. 11/03/18   Stegmayer, Cala Bradford A, PA-C  ferrous sulfate 325 (65 FE) MG tablet Take 325 mg by mouth 2 (two) times daily.    [provider]  gabapentin (NEURONTIN) 100 MG capsule Take 1 capsule (100 mg total) by mouth every 12 (twelve) hours. 07/07/21   Rhetta Mura, MD  insulin detemir (LEVEMIR) 100 UNIT/ML FlexPen Inject 10 Units into the skin in the morning.    [provider]  insulin lispro (HUMALOG) 100 UNIT/ML KwikPen Inject 0-12 Units into the skin See admin instructions. Inject subcutaneously before meals per sliding scale. If BS 100-150; 2 units, 201-250; 4 units, 251-300; 6 units, 301-350; 8 units, 351-400; 10 units, 401-450; 12 units. If greater than 450 call MD. 02/21/21   [provider]  lisinopril (ZESTRIL) 10 MG tablet Take 10 mg by mouth daily.    [provider]  Melatonin 5 MG TABS Take 5 mg by mouth at bedtime.    [provider]  methadone (DOLOPHINE) 10 MG/ML solution Take 25 mg by mouth in the morning.    [provider]  Polyethylene Glycol 3350 (MIRALAX PO) Take 17 g by mouth daily. Mix in 4 to 8 oz of fluid of  choice.    [provider]  senna-docusate (SENOKOT-S) 8.6-50 MG tablet Take 1 tablet by mouth in the morning.    [provider]  tamsulosin (FLOMAX) 0.4 MG CAPS capsule Take 0.4 mg by mouth in the morning.    [provider]   Physical Exam: Vitals:   12/17/22 1958 12/17/22 2000 12/17/22 2100 12/17/22 2135  BP:  (!) 168/108 129/66 (!) 155/66  Pulse:  73 71 68  Resp:   20 18  Temp: 98.4 F (36.9 C)   97.9 F (36.6 C)  TempSrc: Oral     SpO2:  99% 95% 95%  Weight:       Constitutional: appears age-appropriate, NAD, calm Eyes: PERRL, lids and conjunctivae normal ENMT: Mucous membranes are moist. Posterior pharynx clear  of any exudate or lesions. Age-appropriate dentition. Hearing appropriate Neck: normal, supple, no masses, no thyromegaly Respiratory: clear to auscultation bilaterally, no wheezing, no crackles. Normal respiratory effort. No accessory muscle use.  Cardiovascular: Regular rate and rhythm, no murmurs / rubs / gallops. No extremity edema. 2+ pedal pulses. No carotid bruits.  Abdomen: Obese abdomen, no tenderness, no masses palpated, no hepatosplenomegaly. Bowel sounds positive.  Musculoskeletal: no clubbing / cyanosis. No joint deformity upper and lower extremities. Good ROM, no contractures, no atrophy. Normal muscle tone.  Left knee immobilizer in place.  Decreased range of motion in the left lower extremity. Skin: no rashes, lesions, ulcers. No induration Neurologic: Sensation intact. Strength 5/5 in all 4. Bilateral pedal pulses intact Psychiatric: Normal judgment and insight. Alert and oriented x 3. Normal mood.   EKG: Not indicated at this time  Chest x-ray on Admission: I personally reviewed and I agree with radiologist reading as below.  CT Knee Left Wo Contrast  Result Date: 12/17/2022 CLINICAL DATA:  Minimally displaced fracture of the distal medial femoral condyle extending into the intercondylar notch on radiographs earlier today.  Associated lipohemarthrosis. EXAM: CT OF THE LEFT KNEE WITHOUT CONTRAST TECHNIQUE: Multidetector CT imaging of the left knee was performed according to the standard protocol. Multiplanar CT image reconstructions were also generated. RADIATION DOSE REDUCTION: This exam was performed according to the departmental dose-optimization program which includes automated exposure control, adjustment of the mA and/or kV according to patient size and/or use of iterative reconstruction technique. COMPARISON:  Left femur radiographs obtained earlier today. FINDINGS: Bones/Joint/Cartilage Previously described medial condyle fracture extending from the posterior aspect of the condyle proximally with lateral condyle and intercondylar components. The intercondylar component extends distally into the distal intercondylar joint space. The medial component extends through the anterolateral aspect of the medial femoral condyle. Mild impaction/displacement of that component of the fracture. Motion artifacts in the proximal tibia simulating a fracture on the coronal reconstruction images with no true fracture identified. Severe lateral joint space narrowing and associated spur formation. Mild medial and patellofemoral spur formation. Ligaments Suboptimally assessed by CT. Muscles and Tendons No abnormality visualized. Soft tissues Medial subcutaneous edema in the medial leg with a crescent of bladder fluid extending along the medial aspect of the fascia. Moderate-sized hemorrhagic effusion. IMPRESSION: 1. Mildly displaced and impacted fracture of the medial aspect of the medial femoral condyle. 2. Intercondylar fracture with articular involvement. 3. Moderate-sized hemorrhagic effusion. 4. Severe lateral compartment and mild medial and patellofemoral compartment osteoarthritis. Electronically Signed   By: Beckie Salts M.D.   On: 12/17/2022 17:15   CT HEAD WO CONTRAST ( )  Result Date: 12/17/2022 CLINICAL DATA:  Head trauma, minor  (Age >= 65y) on thinners EXAM: CT HEAD WITHOUT CONTRAST TECHNIQUE: Contiguous axial images were obtained from the base of the skull through the vertex without intravenous contrast. RADIATION DOSE REDUCTION: This exam was performed according to the departmental dose-optimization program which includes automated exposure control, adjustment of the mA and/or kV according to patient size and/or use of iterative reconstruction technique. COMPARISON:  CT Head 03/31/22 FINDINGS: Brain: No evidence of acute infarction, hemorrhage, hydrocephalus, extra-axial collection or mass lesion/mass effect. Vascular: No hyperdense vessel or unexpected calcification. Skull: Normal. Negative for fracture or focal lesion. Sinuses/Orbits: No middle ear or mastoid effusion. Paranasal sinuses are notable for frothy secretions in the left sphenoid sinus. Orbits are notable for left lens replacement, otherwise unremarkable. Other: None. IMPRESSION: No acute intracranial abnormality. Electronically Signed   By: Lorenza Cambridge  M.D.   On: 12/17/2022 14:39   DG Chest 1 View  Result Date: 12/17/2022 CLINICAL DATA:  Pain. Status post fall last night. X-rays at facility demonstrated distal femur fracture. EXAM: CHEST  1 VIEW COMPARISON:  Chest radiograph 08/31/2021 FINDINGS: Stable heart size and mediastinal contours. Aortic atherosclerosis. Coarse lung markings which appear chronic. No focal airspace disease, pleural effusion or pneumothorax. Remote right clavicle fracture. Postsurgical change of the right proximal humerus. On limited assessment, no acute osseous findings. IMPRESSION: No acute abnormality. Electronically Signed   By: Narda Rutherford M.D.   On: 12/17/2022 14:34   DG Tibia/Fibula Left  Result Date: 12/17/2022 CLINICAL DATA:  Status post fall last night. X-rays at facility demonstrated distal femur fracture. EXAM: LEFT TIBIA AND FIBULA - 2 VIEW COMPARISON:  None Available. FINDINGS: Fracture of the distal medial femur extends  into the intercondylar notch. Possible involvement of the lateral cortex. No fracture of the tibia or fibula. No dislocation of knee or ankle. There is a knee lipohemarthrosis. Generalized soft tissue edema. IMPRESSION: 1. Fracture of the distal medial femur extends into the intercondylar notch. Possible involvement of the lateral cortex. 2. No fracture of the tibia or fibula. Electronically Signed   By: Narda Rutherford M.D.   On: 12/17/2022 14:33   DG FEMUR MIN 2 VIEWS LEFT  Result Date: 12/17/2022 CLINICAL DATA:  Status post fall last night. X-rays at facility demonstrated distal femur fracture. EXAM: LEFT FEMUR 2 VIEWS COMPARISON:  None Available. FINDINGS: The bones are under mineralized. Minimally displaced fracture of the distal medial femoral condyle. On concurrent tibia exam, this extends to the intercondylar notch. Lipohemarthrosis of the knee better demonstrated on concurrent tibia exam. The proximal femur is intact. Vascular stent in place. IMPRESSION: 1. Minimally displaced fracture of the distal medial femoral condyle, extending to the intercondylar notch. 2. Lipohemarthrosis of the knee better demonstrated on concurrent tibia exam. Electronically Signed   By: Narda Rutherford M.D.   On: 12/17/2022 14:32   DG Pelvis 1-2 Views  Result Date: 12/17/2022 CLINICAL DATA:  Status post fall last night. X-rays at facility demonstrated distal femur fracture. EXAM: PELVIS - 1-2 VIEW COMPARISON:  None Available. FINDINGS: The cortical margins of the bony pelvis are intact. No fracture. Pubic symphysis and sacroiliac joints are congruent. Both femoral heads are well-seated in the respective acetabula. Stool distends the rectum. IMPRESSION: No pelvic fracture. Electronically Signed   By: Narda Rutherford M.D.   On: 12/17/2022 14:30    Labs on Admission: I have personally reviewed following labs  CBC: Recent Labs  Lab 12/17/22 1643  WBC 8.7  NEUTROABS 6.0  HGB 10.4*  HCT 33.4*  MCV 90.5  PLT 310    Basic Metabolic Panel: Recent Labs  Lab 12/17/22 1643  NA 141  K 4.0  CL 109  CO2 23  GLUCOSE 108*  BUN 23  CREATININE 1.56*  CALCIUM 7.9*   GFR: CrCl cannot be calculated (Unknown ideal weight.).  Urine analysis:    Component Value Date/Time   COLORURINE YELLOW (A) 08/31/2021 1201   APPEARANCEUR HAZY (A) 08/31/2021 1201   LABSPEC 1.020 08/31/2021 1201   PHURINE 5.0 08/31/2021 1201   GLUCOSEU NEGATIVE 08/31/2021 1201   HGBUR MODERATE (A) 08/31/2021 1201   BILIRUBINUR NEGATIVE 08/31/2021 1201   BILIRUBINUR negative 01/14/2017 1603   KETONESUR 20 (A) 08/31/2021 1201   PROTEINUR 30 (A) 08/31/2021 1201   UROBILINOGEN 1.0 01/14/2017 1603   NITRITE POSITIVE (A) 08/31/2021 1201   LEUKOCYTESUR LARGE (A)  08/31/2021 1201   This document was prepared using Dragon Voice Recognition software and may include unintentional dictation errors.  Dr. Sedalia Muta [facility]  If 7PM-7AM, please contact overnight-coverage provider If 7AM-7PM, please contact day attending provider [email or web address]  12/17/2022, 10:16 PM

## 2022-12-17 NOTE — Assessment & Plan Note (Signed)
-   Home atorvastatin 40 mg daily

## 2022-12-17 NOTE — Assessment & Plan Note (Signed)
-   Hydralazine 10 mg p.o. every 6 hours as needed for SBP greater than 175, 4 days ordered 

## 2022-12-17 NOTE — Assessment & Plan Note (Signed)
Insulin dependent Insulin SSI with agents coverage ordered

## 2022-12-18 ENCOUNTER — Encounter: Payer: Self-pay | Admitting: Internal Medicine

## 2022-12-18 DIAGNOSIS — S72002A Fracture of unspecified part of neck of left femur, initial encounter for closed fracture: Secondary | ICD-10-CM | POA: Diagnosis not present

## 2022-12-18 LAB — GLUCOSE, CAPILLARY
Glucose-Capillary: 116 mg/dL — ABNORMAL HIGH (ref 70–99)
Glucose-Capillary: 166 mg/dL — ABNORMAL HIGH (ref 70–99)
Glucose-Capillary: 107 mg/dL — ABNORMAL HIGH (ref 70–99)
Glucose-Capillary: 98 mg/dL (ref 70–99)

## 2022-12-18 LAB — CBC
HCT: 32.9 % — ABNORMAL LOW (ref 36.0–46.0)
Hemoglobin: 10.1 g/dL — ABNORMAL LOW (ref 12.0–15.0)
MCH: 27.7 pg (ref 26.0–34.0)
MCHC: 30.7 g/dL (ref 30.0–36.0)
MCV: 90.4 fL (ref 80.0–100.0)
Platelets: 312 10*3/uL (ref 150–400)
RBC: 3.64 MIL/uL — ABNORMAL LOW (ref 3.87–5.11)
RDW: 12.8 % (ref 11.5–15.5)
WBC: 8.5 10*3/uL (ref 4.0–10.5)
nRBC: 0 % (ref 0.0–0.2)

## 2022-12-18 LAB — BASIC METABOLIC PANEL
Anion gap: 9 (ref 5–15)
BUN: 20 mg/dL (ref 8–23)
CO2: 22 mmol/L (ref 22–32)
Calcium: 8.5 mg/dL — ABNORMAL LOW (ref 8.9–10.3)
Chloride: 108 mmol/L (ref 98–111)
Creatinine, Ser: 1.29 mg/dL — ABNORMAL HIGH (ref 0.44–1.00)
GFR, Estimated: 44 mL/min — ABNORMAL LOW (ref >60)
Glucose, Bld: 114 mg/dL — ABNORMAL HIGH (ref 70–99)
Potassium: 4.1 mmol/L (ref 3.5–5.1)
Sodium: 139 mmol/L (ref 135–145)

## 2022-12-18 LAB — HEMOGLOBIN A1C
Hgb A1c MFr Bld: 7.2 % — ABNORMAL HIGH (ref 4.8–5.6)
Mean Plasma Glucose: 159.94 mg/dL

## 2022-12-18 MED ORDER — METHADONE HCL 10 MG/ML PO CONC: 30.0000 mg | Freq: Every day | ORAL | Status: DC

## 2022-12-18 MED ORDER — OXYCODONE HCL 5 MG PO TABS: 5.0000 mg | ORAL_TABLET | ORAL | Status: DC | PRN

## 2022-12-18 MED ORDER — INSULIN GLARGINE-YFGN 100 UNIT/ML ~~LOC~~ SOLN
14.0000 [IU] | Freq: Every day | SUBCUTANEOUS | Status: DC
  Administered 2022-12-18 – 2022-12-20 (×3): 14 [IU] via SUBCUTANEOUS
  Filled 2022-12-18 (×3): qty 0.14

## 2022-12-18 MED ORDER — ARIPIPRAZOLE 2 MG PO TABS
10.0000 mg | ORAL_TABLET | Freq: Every day | ORAL | Status: DC
  Administered 2022-12-18 – 2022-12-19 (×2): 10 mg via ORAL
  Filled 2022-12-18 (×2): qty 5

## 2022-12-18 MED ORDER — TRAZODONE HCL 50 MG PO TABS
25.0000 mg | ORAL_TABLET | Freq: Every day | ORAL | Status: DC
  Administered 2022-12-19 (×2): 25 mg via ORAL
  Filled 2022-12-18 (×2): qty 1

## 2022-12-18 MED ORDER — VENLAFAXINE HCL ER 75 MG PO CP24
75.0000 mg | ORAL_CAPSULE | Freq: Every day | ORAL | Status: DC
  Administered 2022-12-19 – 2022-12-20 (×2): 75 mg via ORAL
  Filled 2022-12-18 (×2): qty 1

## 2022-12-18 MED ORDER — POLYETHYLENE GLYCOL 3350 17 G PO PACK
17.0000 g | PACK | Freq: Two times a day (BID) | ORAL | Status: DC
  Administered 2022-12-18: 17 g via ORAL
  Filled 2022-12-18 (×4): qty 1

## 2022-12-18 MED ORDER — POLYETHYLENE GLYCOL 3350 17 GM/SCOOP PO POWD
17.0000 g | Freq: Two times a day (BID) | ORAL | Status: DC
  Filled 2022-12-18: qty 255

## 2022-12-18 MED ORDER — OXYCODONE HCL 5 MG PO TABS
5.0000 mg | ORAL_TABLET | ORAL | Status: DC | PRN
  Administered 2022-12-18 – 2022-12-20 (×6): 5 mg via ORAL
  Filled 2022-12-18 (×8): qty 1

## 2022-12-18 MED ORDER — FAMOTIDINE 20 MG PO TABS
20.0000 mg | ORAL_TABLET | Freq: Every day | ORAL | Status: DC
  Administered 2022-12-18 – 2022-12-20 (×3): 20 mg via ORAL
  Filled 2022-12-18 (×3): qty 1

## 2022-12-18 MED ORDER — AMLODIPINE BESYLATE 5 MG PO TABS
5.0000 mg | ORAL_TABLET | Freq: Every day | ORAL | Status: DC
  Administered 2022-12-18 – 2022-12-20 (×3): 5 mg via ORAL
  Filled 2022-12-18 (×3): qty 1

## 2022-12-18 MED ORDER — ATORVASTATIN CALCIUM 20 MG PO TABS
20.0000 mg | ORAL_TABLET | Freq: Every evening | ORAL | Status: DC
  Administered 2022-12-18 – 2022-12-19 (×2): 20 mg via ORAL
  Filled 2022-12-18 (×2): qty 1

## 2022-12-18 MED ORDER — OXYCODONE HCL 5 MG PO TABS
5.0000 mg | ORAL_TABLET | Freq: Four times a day (QID) | ORAL | Status: DC | PRN
  Administered 2022-12-18: 5 mg via ORAL
  Filled 2022-12-18: qty 1

## 2022-12-18 MED ORDER — SENNOSIDES-DOCUSATE SODIUM 8.6-50 MG PO TABS
1.0000 | ORAL_TABLET | Freq: Two times a day (BID) | ORAL | Status: DC
  Administered 2022-12-18 – 2022-12-20 (×4): 1 via ORAL
  Filled 2022-12-18 (×5): qty 1

## 2022-12-18 NOTE — Consult Note (Addendum)
ORTHOPAEDIC CONSULTATION  REQUESTING PHYSICIAN: Lucile Shutters, MD  Chief Complaint:   L knee pain  History of Present Illness: Yesenia Shepard is a 72 y.o. femalewho had a fall yesterday when attempting to transfer from chair to bed.  She is wheelchair-bound, and has been unable to ambulate for approximately 4 years.  She lives at [facility].  The patient noted immediate knee pain after a fall.  Imaging in the emergency department showed a minimally displaced left distal femur fracture.  She is admitted for rehab/placement purposes and due to acute kidney injury.  She does have a medical history significant for hypertension, diabetes (hemoglobin A1c of 7.2 from today), vascular dementia, CAD, prior DVT on Eliquis, depression, and anxiety. Past Medical History:  Diagnosis Date   Anxiety    Arthritis    Chronic pain    Depression    Diabetes mellitus without complication (HCC)    Drug abuse (HCC)    History of polysubstance abuse; currently on methadone.   Hepatitis    History of Hep "C". treated and cured with Harvoni   History of chicken pox    Hypertension    Panic attacks    Peripheral vascular disease (HCC)    stent in place.    UTI (urinary tract infection)    History of   Past Surgical History:  Procedure Laterality Date   ABDOMINAL HYSTERECTOMY  1989   AMPUTATION TOE Left 10/26/2018   Procedure: 5TH RAY RESCTION;  Surgeon: Recardo Evangelist, DPM;  Location: [facility];  Service: Podiatry;  Laterality: Left;   APPLICATION OF WOUND VAC N/A 02/05/2019   Procedure: APPLICATION OF WOUND VAC POSS DEBRIDEMENT;  Surgeon: Duanne Guess, MD;  Location: [facility];  Service: General;  Laterality: N/A;   APPLICATION OF WOUND VAC  02/03/2019   Procedure: APPLICATION OF WOUND VAC;  Surgeon: Duanne Guess, MD;  Location: [facility];  Service: General;;   CHOLECYSTECTOMY  1987   INTRACAPSULAR CATARACT EXTRACTION Left     IRRIGATION AND DEBRIDEMENT ABSCESS N/A 02/03/2019   Procedure: IRRIGATION AND DEBRIDEMENT SACRAL DECUBITUS;  Surgeon: Duanne Guess, MD;  Location: [facility];  Service: General;  Laterality: N/A;   KNEE SURGERY Left    LOWER EXTREMITY ANGIOGRAPHY Left 12/17/2017   Procedure: LOWER EXTREMITY ANGIOGRAPHY;  Surgeon: Renford Dills, MD;  Location: [facility];  Service: Cardiovascular;  Laterality: Left;   LOWER EXTREMITY ANGIOGRAPHY Left 10/24/2018   Procedure: LOWER EXTREMITY ANGIOGRAPHY;  Surgeon: Annice Needy, MD;  Location: [facility];  Service: Cardiovascular;  Laterality: Left;   PERIPHERAL VASCULAR CATHETERIZATION Left 05/04/2015   Procedure: Lower Extremity Angiography;  Surgeon: Renford Dills, MD;  Location: [facility];  Service: Cardiovascular;  Laterality: Left;   PERIPHERAL VASCULAR CATHETERIZATION Left 05/04/2015   Procedure: Lower Extremity Intervention;  Surgeon: Renford Dills, MD;  Location: [facility];  Service: Cardiovascular;  Laterality: Left;   SHOULDER ARTHROSCOPY WITH OPEN ROTATOR CUFF REPAIR Right 01/16/2017   Procedure: SHOULDER ARTHROSCOPY WITH OPEN ROTATOR CUFF REPAIR;  Surgeon: Juanell Fairly, MD;  Location: [facility];  Service: Orthopedics;  Laterality: Right;   TONSILLECTOMY AND ADENOIDECTOMY  1971   Social History   Socioeconomic History   Marital status: Divorced    Spouse name: Not on file   Number of children: Not on file   Years of education: Not on file   Highest education level: Not on file  Occupational History   Not on file  Tobacco Use  Smoking status: Former    Packs/day: 0.50    Years: 30.00    Additional pack years: 0.00    Total pack years: 15.00    Types: Cigarettes    Quit date: 07/25/2018    Years since quitting: 4.4   Smokeless tobacco: Never  Vaping Use   Vaping Use: Never used  Substance and Sexual Activity   Alcohol use: No    Alcohol/week: 0.0 standard drinks of alcohol    Drug use: Not Currently    Comment: Hx of.   Sexual activity: Not Currently  Other Topics Concern   Not on file  Social History Narrative   1 daughter died in January 14, 2017    Lives at home not alone    Social Determinants of Health   Financial Resource Strain: Low Risk  (09/20/2017)   Overall Financial Resource Strain (CARDIA)    Difficulty of Paying Living Expenses: Not hard at all  Food Insecurity: No Food Insecurity (12/17/2022)   Hunger Vital Sign    Worried About Running Out of Food in the Last Year: Never true    Ran Out of Food in the Last Year: Never true  Transportation Needs: No Transportation Needs (12/17/2022)   PRAPARE - Administrator, Civil Service (Medical): No    Lack of Transportation (Non-Medical): No  Physical Activity: Unknown (09/20/2017)   Exercise Vital Sign    Days of Exercise per Week: 0 days    Minutes of Exercise per Session: Not on file  Stress: Not on file  Social Connections: Not on file   Family History  Problem Relation Age of Onset   Arthritis Mother    Mental illness Mother        depression   Diabetes Mother    Cancer Mother        pancreatic   Cancer Father        colon   Heart disease Father    Diabetes Father    Cancer Daughter        lung   No Known Allergies Prior to Admission medications   Medication Sig Start Date End Date Taking? Authorizing Provider  acetaminophen (TYLENOL) 325 MG tablet Take 2 tablets (650 mg total) by mouth every 6 (six) hours as needed for mild pain (or Fever >/= 101). 11/03/18  Yes Stegmayer, Cala Bradford A, PA-C  amLODipine (NORVASC) 5 MG tablet Take 5 mg by mouth daily.   Yes [provider]  apixaban (ELIQUIS) 2.5 MG TABS tablet Take 2.5 mg by mouth 2 (two) times daily.   Yes [provider]  ARIPiprazole (ABILIFY) 10 MG tablet Take 10 mg by mouth at bedtime.   Yes [provider]  ascorbic acid (VITAMIN C) 500 MG tablet Take 500 mg by mouth daily.   Yes [provider]  cetirizine (ZYRTEC) 5 MG tablet Take 5 mg by mouth daily.   Yes [provider]  desvenlafaxine (PRISTIQ) 50 MG 24 hr tablet Take 50 mg by mouth in the morning.   Yes [provider]  famotidine (PEPCID) 20 MG tablet Take 1 tablet (20 mg total) by mouth daily. 02/12/19  Yes Ojie, Jude, MD  furosemide (LASIX) 20 MG tablet Take 20 mg by mouth daily.   Yes [provider]  gabapentin (NEURONTIN) 100 MG capsule Take 1 capsule (100 mg total) by mouth every 12 (twelve) hours. 07/07/21  Yes Rhetta Mura, MD  hydroxypropyl methylcellulose / hypromellose (ISOPTO TEARS / GONIOVISC) 2.5 % ophthalmic solution  Place 1 drop into both eyes at bedtime.   Yes [provider]  insulin glargine (LANTUS) 100 UNIT/ML injection Inject 14 Units into the skin daily.   Yes [provider]  insulin lispro (HUMALOG) 100 UNIT/ML KwikPen Inject 0-12 Units into the skin See admin instructions. Inject subcutaneously before meals per sliding scale. If BS 100-150; 2 units, 201-250; 4 units, 251-300; 6 units, 301-350; 8 units, 351-400; 10 units, 401-450; 12 units. If greater than 450 call MD. 02/21/21  Yes [provider]  metFORMIN (GLUCOPHAGE) 500 MG tablet Take 500 mg by mouth daily with breakfast.   Yes [provider]  methadone (DOLOPHINE) 10 MG/ML solution Take 30 mg by mouth in the morning.   Yes [provider]  Olopatadine HCl (PATADAY) 0.7 % SOLN Place 1 drop into both eyes daily at 6 (six) AM. 12/06/22 01/06/23 Yes [provider]  Polyethylene Glycol 3350 (MIRALAX PO) Take 17 g by mouth 2 (two) times daily. Mix in 4 to 8 oz of fluid of choice.   Yes [provider]  senna-docusate (SENOKOT-S) 8.6-50 MG tablet Take 1 tablet by mouth 2 (two) times daily.   Yes [provider]  traZODone (DESYREL) 50 MG tablet Take 25 mg by mouth at bedtime.   Yes [provider]  atorvastatin (LIPITOR) 40 MG tablet Take 1  tablet (40 mg total) by mouth daily. Patient taking differently: Take 20 mg by mouth every evening. 04/10/18   [name], Pasty Spillers, MD  cholecalciferol (VITAMIN D3) 25 MCG (1000 UT) tablet Take 1 tablet (1,000 Units total) by mouth daily. Patient not taking: Reported on 12/18/2022 11/04/18   Stegmayer, Cala Bradford A, PA-C  feeding supplement, GLUCERNA SHAKE, (GLUCERNA SHAKE) LIQD Take 237 mLs by mouth 2 (two) times daily between meals. 11/03/18   Stegmayer, Cala Bradford A, PA-C  ferrous sulfate 325 (65 FE) MG tablet Take 325 mg by mouth 2 (two) times daily. Patient not taking: Reported on 12/18/2022    [provider]  insulin detemir (LEVEMIR) 100 UNIT/ML FlexPen Inject 10 Units into the skin in the morning. Patient not taking: Reported on 12/18/2022    [provider]  lisinopril (ZESTRIL) 10 MG tablet Take 10 mg by mouth daily. Patient not taking: Reported on 12/18/2022    [provider]  Melatonin 10 MG TABS Take 10 mg by mouth at bedtime.    [provider]  tamsulosin (FLOMAX) 0.4 MG CAPS capsule Take 0.4 mg by mouth in the morning. Patient not taking: Reported on 12/18/2022    [provider]   Recent Labs    12/17/22 1643 12/18/22 0752  WBC 8.7 8.5  HGB 10.4* 10.1*  HCT 33.4* 32.9*  PLT 310 312  K 4.0 4.1  CL 109 108  CO2 23 22  BUN 23 20  CREATININE 1.56* 1.29*  GLUCOSE 108* 114*  CALCIUM 7.9* 8.5*   CT Knee Left Wo Contrast  Result Date: 12/17/2022 CLINICAL DATA:  Minimally displaced fracture of the distal medial femoral condyle extending into the intercondylar notch on radiographs earlier today. Associated lipohemarthrosis. EXAM: CT OF THE LEFT KNEE WITHOUT CONTRAST TECHNIQUE: Multidetector CT imaging of the left knee was performed according to the standard protocol. Multiplanar CT image reconstructions were also generated. RADIATION DOSE REDUCTION: This exam was performed according to the departmental dose-optimization program which  includes automated exposure control, adjustment of the mA and/or kV according to patient size and/or use of iterative reconstruction technique. COMPARISON:  Left femur radiographs obtained  earlier today. FINDINGS: Bones/Joint/Cartilage Previously described medial condyle fracture extending from the posterior aspect of the condyle proximally with lateral condyle and intercondylar components. The intercondylar component extends distally into the distal intercondylar joint space. The medial component extends through the anterolateral aspect of the medial femoral condyle. Mild impaction/displacement of that component of the fracture. Motion artifacts in the proximal tibia simulating a fracture on the coronal reconstruction images with no true fracture identified. Severe lateral joint space narrowing and associated spur formation. Mild medial and patellofemoral spur formation. Ligaments Suboptimally assessed by CT. Muscles and Tendons No abnormality visualized. Soft tissues Medial subcutaneous edema in the medial leg with a crescent of bladder fluid extending along the medial aspect of the fascia. Moderate-sized hemorrhagic effusion. IMPRESSION: 1. Mildly displaced and impacted fracture of the medial aspect of the medial femoral condyle. 2. Intercondylar fracture with articular involvement. 3. Moderate-sized hemorrhagic effusion. 4. Severe lateral compartment and mild medial and patellofemoral compartment osteoarthritis. Electronically Signed   By: Beckie Salts M.D.   On: 12/17/2022 17:15   CT HEAD WO CONTRAST ( )  Result Date: 12/17/2022 CLINICAL DATA:  Head trauma, minor (Age >= 65y) on thinners EXAM: CT HEAD WITHOUT CONTRAST TECHNIQUE: Contiguous axial images were obtained from the base of the skull through the vertex without intravenous contrast. RADIATION DOSE REDUCTION: This exam was performed according to the departmental dose-optimization program which includes automated exposure control, adjustment of the  mA and/or kV according to patient size and/or use of iterative reconstruction technique. COMPARISON:  CT Head 03/31/22 FINDINGS: Brain: No evidence of acute infarction, hemorrhage, hydrocephalus, extra-axial collection or mass lesion/mass effect. Vascular: No hyperdense vessel or unexpected calcification. Skull: Normal. Negative for fracture or focal lesion. Sinuses/Orbits: No middle ear or mastoid effusion. Paranasal sinuses are notable for frothy secretions in the left sphenoid sinus. Orbits are notable for left lens replacement, otherwise unremarkable. Other: None. IMPRESSION: No acute intracranial abnormality. Electronically Signed   By: Lorenza Cambridge M.D.   On: 12/17/2022 14:39   DG Chest 1 View  Result Date: 12/17/2022 CLINICAL DATA:  Pain. Status post fall last night. X-rays at facility demonstrated distal femur fracture. EXAM: CHEST  1 VIEW COMPARISON:  Chest radiograph 08/31/2021 FINDINGS: Stable heart size and mediastinal contours. Aortic atherosclerosis. Coarse lung markings which appear chronic. No focal airspace disease, pleural effusion or pneumothorax. Remote right clavicle fracture. Postsurgical change of the right proximal humerus. On limited assessment, no acute osseous findings. IMPRESSION: No acute abnormality. Electronically Signed   By: Narda Rutherford M.D.   On: 12/17/2022 14:34   DG Tibia/Fibula Left  Result Date: 12/17/2022 CLINICAL DATA:  Status post fall last night. X-rays at facility demonstrated distal femur fracture. EXAM: LEFT TIBIA AND FIBULA - 2 VIEW COMPARISON:  None Available. FINDINGS: Fracture of the distal medial femur extends into the intercondylar notch. Possible involvement of the lateral cortex. No fracture of the tibia or fibula. No dislocation of knee or ankle. There is a knee lipohemarthrosis. Generalized soft tissue edema. IMPRESSION: 1. Fracture of the distal medial femur extends into the intercondylar notch. Possible involvement of the lateral cortex. 2. No  fracture of the tibia or fibula. Electronically Signed   By: Narda Rutherford M.D.   On: 12/17/2022 14:33   DG FEMUR MIN 2 VIEWS LEFT  Result Date: 12/17/2022 CLINICAL DATA:  Status post fall last night. X-rays at facility demonstrated distal femur fracture. EXAM: LEFT FEMUR 2 VIEWS COMPARISON:  None Available. FINDINGS: The bones are under mineralized. Minimally displaced fracture  of the distal medial femoral condyle. On concurrent tibia exam, this extends to the intercondylar notch. Lipohemarthrosis of the knee better demonstrated on concurrent tibia exam. The proximal femur is intact. Vascular stent in place. IMPRESSION: 1. Minimally displaced fracture of the distal medial femoral condyle, extending to the intercondylar notch. 2. Lipohemarthrosis of the knee better demonstrated on concurrent tibia exam. Electronically Signed   By: Narda Rutherford M.D.   On: 12/17/2022 14:32   DG Pelvis 1-2 Views  Result Date: 12/17/2022 CLINICAL DATA:  Status post fall last night. X-rays at facility demonstrated distal femur fracture. EXAM: PELVIS - 1-2 VIEW COMPARISON:  None Available. FINDINGS: The cortical margins of the bony pelvis are intact. No fracture. Pubic symphysis and sacroiliac joints are congruent. Both femoral heads are well-seated in the respective acetabula. Stool distends the rectum. IMPRESSION: No pelvic fracture. Electronically Signed   By: Narda Rutherford M.D.   On: 12/17/2022 14:30     Positive ROS: All other systems have been reviewed and were otherwise negative with the exception of those mentioned in the HPI and as above.  Physical Exam: BP (!) 162/114   Pulse 79   Temp 98.8 F (37.1 C)   Resp 14   Wt 81.6 kg   SpO2 94%   BMI 29.95 kg/m  General:  Alert, no acute distress   Orthopedic Exam:  LLE: + DF/PF/EHL SILT grossly over foot Foot wwp Significant tenderness about distal femur.  No break in skin +joint effusion Knee resting in slight flexion.  Patient unwilling to  allow for range of motion.   Imaging:  As above: L minimally displaced intraarticular distal femur fracture involving the medial femoral condyle. Articular surface appears relatively well-maintained. There are also significant degenerative changes to the knee joint  Assessment/Plan: Yesenia Shepard is a 72 y.o. female with a L minimally displaced distal femur fracture, primarily involving the medial femoral condyle, with underlying degenerative changes to the L knee   1. I discussed the various treatment options including both surgical and non-surgical management of the fracture with the patient.  Given the minimal displacement and relative integrity of the articular surface in addition to the patient's nonambulatory status, we agreed to proceed with nonoperative management.  This would consist of immobilization in a knee immobilizer for approximately 2 weeks before transition to a hinged knee brace to allow for range of motion. Knee immobilizer was adjusted and applied in a more appropriate position to allow for appropriate immobilization of the fracture.  2.  Nonweightbearing on left lower extremity.  Maintain knee immobilizer.  3.  PT/OT evaluation recommended.  4.  She may resume baseline anticoagulation as medically indicated   5.  Will plan to follow peripherally while inpatient.  She may follow-up with [facility] clinic orthopedics in approximately 2 weeks.  Please page with any questions.   Signa Kell   12/18/2022 1:51 PM

## 2022-12-18 NOTE — TOC Progression Note (Addendum)
Transition of Care [facility]) - Progression Note    Patient Details  Name: Yesenia Shepard MRN: [id number] Date of Birth: Jan 08, 1951  [facility]) CM/SW Contact  Marlowe Sax, RN Phone Number: 12/18/2022, 12:20 PM  Clinical Narrative:     Confirmed that the patient is a long term care patient at [facility] and is able to return, TOC to continue to monitor for needs and Assist with DC planning left femoral minimally displaced fracture is non operative       Expected Discharge Plan and Services                                               Social Determinants of Health (SDOH) Interventions SDOH Screenings   Food Insecurity: No Food Insecurity (12/17/2022)  Housing: Low Risk  (12/17/2022)  Transportation Needs: No Transportation Needs (12/17/2022)  Utilities: Not At Risk (12/17/2022)  Depression (PHQ2-9): Low Risk  (07/21/2019)  Financial Resource Strain: Low Risk  (09/20/2017)  Physical Activity: Unknown (09/20/2017)  Tobacco Use: Medium Risk (12/17/2022)    Readmission Risk Interventions    09/01/2021    3:02 PM 07/07/2021    9:14 AM  Readmission Risk Prevention Plan  Transportation Screening Complete Complete  PCP or Specialist Appt within 3-5 Days  Complete  Social Work Consult for Recovery Care Planning/Counseling  Complete  Palliative Care Screening  Not Applicable  Medication Review Ocean[employer])  Complete

## 2022-12-18 NOTE — Progress Notes (Addendum)
Progress Note   Patient: Yesenia Shepard JYN:[id number] DOB: Oct 29, 1950 DOA: 12/17/2022     1 DOS: the patient was seen and examined on 12/18/2022   Brief hospital course:  Ms. Yesenia Shepard is a 72 year old female with history of hypertension, neuropathy, insulin-dependent diabetes mellitus, vascular dementia, history of CAD, DVT on Eliquis, depression, with anxiety, who presents to the emergency department for chief concerns of fall with inability to bear weight.   Vitals in the ED showed temperature of 98.4, respiration rate of 20, heart rate of 73, blood pressure 168/108, SpO2 99% on 2 L nasal cannula.   Serum sodium is 141, potassium 4.0, chloride 109, bicarb 23, BUN of 23, serum creatinine 1.56, EGFR 35, nonfasting glucose 108, WBC 8.7, hemoglobin 10.4, platelets of 09/13/2008.   ED treatment: Fentanyl 50 micrograms one-time dose, Dilaudid 2.5 mg IV one-time dose. ---------------------------- At bedside, patient was able to tell me her name, age, location, current calendar year.  She does not appear to be in acute distress.  She states that she was in the process of transferring from her bed to her chair when she fell and has been having difficulty bearing weight since then.   She reports the pain is 10 out of 10.   She denies head trauma, loss of consciousness, syncope.  She denies chest pain, abdominal pain, dysuria, hematuria, diarrhea, shortness of breath.   Social history: Patient current lives at [facility].    Assessment and Plan: Status post fall Minimally displaced fracture of the distal medial femoral Orthopedic surgery was consulted and Dr. Allena Katz states that the left femoral minimally displaced fracture is non operative and recommends a left knee immobilizer. Patient should be NWB on LLE.  Will request PT/OT evaluation Pain control with oxycodone 5 mg every 4 hours as needed for moderate pain and IV morphine 2 mg IV every 4 as needed for severe pain    Left-sided  hemorrhagic effusion Patient noted to have a moderate left-sided hemorrhagic effusion Eliquis is currently on hold Patient denies having any shortness of breath and is not hypoxic. Will request pulmonary consult    AKI (acute kidney injury) (HCC) Baseline serum creatinine of 0.85.  Was 1.56 on admission Continue IV fluid resuscitation Hold furosemide and lisinopril BMP in the a.m.   DVT (deep venous thrombosis) (HCC) Home Eliquis not resumed on admission due to moderate hemorrhagic effusion of the right lower extremity SCD for DVT prophylaxis   Type II diabetes mellitus  Maintain consistent carbohydrate diet Glycemic control with sliding scale insulin Continue long-acting insulin    HLD (hyperlipidemia) Home atorvastatin 40 mg daily   Essential hypertension Continue amlodipine        Subjective: Patient is seen and examined at the bedside.  Left leg is in an immobilizer  Physical Exam: Vitals:   12/17/22 2135 12/17/22 2356 12/18/22 0452 12/18/22 0754  BP: (!) 155/66 (!) 120/99 (!) 153/70 (!) 162/114  Pulse: 68 72 71 79  Resp: 18 18 18 14   Temp: 97.9 F (36.6 C) 98.1 F (36.7 C) 98.1 F (36.7 C) 98.8 F (37.1 C)  TempSrc:      SpO2: 95% 98% 96% 94%  Weight:      Constitutional: appears age-appropriate, NAD, calm Eyes: PERRL, lids and conjunctivae normal ENMT: Mucous membranes are moist. Posterior pharynx clear of any exudate or lesions. Age-appropriate dentition. Hearing appropriate Neck: normal, supple, no masses, no thyromegaly Respiratory: clear to auscultation bilaterally, no wheezing, no crackles. Normal respiratory effort. No accessory  muscle use.  Cardiovascular: Regular rate and rhythm, no murmurs / rubs / gallops. No extremity edema. 2+ pedal pulses. No carotid bruits.  Abdomen: Obese abdomen, no tenderness, no masses palpated, no hepatosplenomegaly. Bowel sounds positive.  Musculoskeletal: no clubbing / cyanosis. No joint deformity upper and lower  extremities. Good ROM, no contractures, no atrophy. Normal muscle tone.  Left knee immobilizer in place.  Decreased range of motion in the left lower extremity. Skin: no rashes, lesions, ulcers. No induration Neurologic: Weakness Psychiatric: Normal judgment and insight. Alert and oriented x 3. Normal mood.   Data Reviewed: Labs reviewed. Hemoglobin A1c 7.2 Creatinine 1.29 There are no new results to review at this time.  Family Communication:   Disposition: Status is: Inpatient Remains inpatient appropriate because: Pain control Planned Discharge Destination: Skilled nursing facility Plan of care discussed with patient's sister over the phone, Neita Carp.  All questions and concerns have been addressed.    Time spent: 35 minutes  Author: Lucile Shutters, MD 12/18/2022 1:53 PM  For on call review [email or web address].

## 2022-12-18 NOTE — Plan of Care (Signed)
  Problem: Coping: Goal: Ability to adjust to condition or change in health will improve Outcome: Progressing   Problem: Coping: Goal: Ability to adjust to condition or change in health will improve Outcome: Progressing   Problem: Elimination: Goal: Will not experience complications related to bowel motility Outcome: Progressing Goal: Will not experience complications related to urinary retention Outcome: Progressing   Problem: Pain Managment: Goal: General experience of comfort will improve Outcome: Progressing   Problem: Safety: Goal: Ability to remain free from injury will improve Outcome: Progressing   Problem: Skin Integrity: Goal: Risk for impaired skin integrity will decrease Outcome: Progressing

## 2022-12-19 ENCOUNTER — Encounter: Payer: Self-pay | Admitting: Internal Medicine

## 2022-12-19 DIAGNOSIS — S72002A Fracture of unspecified part of neck of left femur, initial encounter for closed fracture: Secondary | ICD-10-CM | POA: Diagnosis not present

## 2022-12-19 LAB — CBC
HCT: 33.7 % — ABNORMAL LOW (ref 36.0–46.0)
Hemoglobin: 10.2 g/dL — ABNORMAL LOW (ref 12.0–15.0)
MCH: 28.3 pg (ref 26.0–34.0)
MCHC: 30.3 g/dL (ref 30.0–36.0)
MCV: 93.4 fL (ref 80.0–100.0)
Platelets: 284 10*3/uL (ref 150–400)
RBC: 3.61 MIL/uL — ABNORMAL LOW (ref 3.87–5.11)
RDW: 12.6 % (ref 11.5–15.5)
WBC: 9 10*3/uL (ref 4.0–10.5)
nRBC: 0 % (ref 0.0–0.2)

## 2022-12-19 LAB — GLUCOSE, CAPILLARY
Glucose-Capillary: 132 mg/dL — ABNORMAL HIGH (ref 70–99)
Glucose-Capillary: 175 mg/dL — ABNORMAL HIGH (ref 70–99)
Glucose-Capillary: 144 mg/dL — ABNORMAL HIGH (ref 70–99)
Glucose-Capillary: 74 mg/dL (ref 70–99)

## 2022-12-19 MED ORDER — HYDROMORPHONE HCL 1 MG/ML IJ SOLN
1.0000 mg | INTRAMUSCULAR | Status: AC | PRN
  Administered 2022-12-19: 1 mg via INTRAVENOUS
  Filled 2022-12-19: qty 1

## 2022-12-19 MED ORDER — METHADONE HCL 10 MG PO TABS
30.0000 mg | ORAL_TABLET | Freq: Every day | ORAL | Status: DC
  Administered 2022-12-19 – 2022-12-20 (×2): 30 mg via ORAL
  Filled 2022-12-19 (×2): qty 3

## 2022-12-19 MED ORDER — METHOCARBAMOL 500 MG PO TABS
500.0000 mg | ORAL_TABLET | Freq: Three times a day (TID) | ORAL | Status: DC
  Administered 2022-12-19 – 2022-12-20 (×4): 500 mg via ORAL
  Filled 2022-12-19 (×4): qty 1

## 2022-12-19 NOTE — [place] FL2 (Signed)
[place] MEDICAID FL2 LEVEL OF CARE FORM     IDENTIFICATION  Patient Name: Yesenia Shepard Birthdate: 1950/12/30 Sex: female Admission Date (Current Location): 12/17/2022  [facility] and [place] Number:  Chiropodist and Address:  [facility], [address] [place], [place] [address]      Provider Number: [id number]  Attending Physician Name and Address:  Tresa Moore, MD  Relative Name and Phone Number:  Jacklynn Lewis [phone]    Current Level of Care: Hospital Recommended Level of Care: Skilled Nursing Facility Prior Approval Number:    Date Approved/Denied:   PASRR Number: [id number]  Discharge Plan: SNF    Current Diagnoses: Patient Active Problem List   Diagnosis Date Noted   Left displaced femoral neck fracture (HCC) 12/17/2022   AKI (acute kidney injury) (HCC) 12/17/2022   Proctocolitis 08/31/2021   Type II diabetes mellitus with renal manifestations (HCC) 08/31/2021   Acute metabolic encephalopathy 08/31/2021   PVD (peripheral vascular disease) (HCC) 08/31/2021   Chronic pain 08/31/2021   DVT (deep venous thrombosis) (HCC) 08/31/2021   Atypical pneumonia 07/06/2021   Acquired absence of other left toe(s) (HCC) 10/20/2020   Decubitus ulcer, infected 01/28/2019   Pressure injury of skin 12/24/2018   Sepsis (HCC) 12/22/2018   CKD (chronic kidney disease), stage IV (HCC) 12/22/2018   UTI (urinary tract infection) 12/22/2018   Elevated CK 12/22/2018   Atherosclerotic peripheral vascular disease with ulceration (HCC) 10/24/2018   Atherosclerosis of artery of extremity with ulceration (HCC) 10/24/2018   Atherosclerosis of native arteries of extremity with rest pain (HCC) 10/01/2018   Chronic midline low back pain with bilateral sciatica 04/23/2018   Vitamin D deficiency 04/10/2018   Swelling of limb 03/01/2018   Atherosclerosis of native arteries of extremity with intermittent claudication (HCC) 01/03/2018   Leg pain  12/12/2017   S/P right rotator cuff repair 01/16/2017   Preoperative examination 01/14/2017   Full thickness rotator cuff tear 12/24/2016   Right shoulder pain 09/06/2016   Hepatic cirrhosis (HCC) 02/09/2016   Chronic hepatitis C without hepatic coma (HCC) 11/09/2015   HLD (hyperlipidemia) 08/20/2015   Peripheral vascular disease (HCC) 08/12/2015   Essential hypertension 08/12/2015   Type 2 diabetes mellitus with other specified complication, unspecified whether long term insulin use (HCC) 04/19/2015   Current smoker 03/15/2015   History of recreational drug use 03/15/2015   Osteoarthritis 03/15/2015   Preventative health care 03/15/2015   Ulcer of left lower leg, limited to breakdown of skin (HCC) 03/15/2015   Anxiety and depression 08/25/2012    Orientation RESPIRATION BLADDER Height & Weight     Self, Time, Situation, Place    Incontinent Weight: 81.6 kg Height:  5\' 5"  (165.1 cm)  BEHAVIORAL SYMPTOMS/MOOD NEUROLOGICAL BOWEL NUTRITION STATUS      Incontinent Diet (See DC summary)  AMBULATORY STATUS COMMUNICATION OF NEEDS Skin   Extensive Assist Verbally Normal (Brace on leg)                       Personal Care Assistance Level of Assistance  Bathing, Feeding, Dressing Bathing Assistance: Limited assistance Feeding assistance: Limited assistance Dressing Assistance: Limited assistance     Functional Limitations Info  Sight, Hearing, Speech Sight Info: Adequate Hearing Info: Adequate Speech Info: Adequate    SPECIAL CARE FACTORS FREQUENCY  PT (By licensed PT), OT (By licensed OT)     PT Frequency: 5 times per week OT Frequency: 5 times per week  Contractures Contractures Info: Not present    Additional Factors Info  Code Status, Allergies Code Status Info: Full code Allergies Info: NKDA           Current Medications (12/19/2022):  This is the current hospital active medication list Current Facility-Administered Medications  Medication  Dose Route Frequency Provider Last Rate Last Admin   amLODipine (NORVASC) tablet 5 mg  5 mg Oral Daily Agbata, Tochukwu, MD   5 mg at 12/19/22 1034   ARIPiprazole (ABILIFY) tablet 10 mg  10 mg Oral QHS Agbata, Tochukwu, MD   10 mg at 12/18/22 2042   atorvastatin (LIPITOR) tablet 20 mg  20 mg Oral QPM Agbata, Tochukwu, MD   20 mg at 12/18/22 1739   famotidine (PEPCID) tablet 20 mg  20 mg Oral Daily Agbata, Tochukwu, MD   20 mg at 12/19/22 1034   ferrous sulfate tablet 325 mg  325 mg Oral BID Cox, Amy N, DO   325 mg at 12/19/22 1034   heparin injection 5,000 Units  5,000 Units Subcutaneous Q8H Cox, Amy N, DO   5,000 Units at 12/19/22 0427   hydrALAZINE (APRESOLINE) tablet 10 mg  10 mg Oral Q6H PRN Cox, Amy N, DO       insulin aspart (novoLOG) injection 0-5 Units  0-5 Units Subcutaneous QHS Cox, Amy N, DO       insulin aspart (novoLOG) injection 0-9 Units  0-9 Units Subcutaneous TID WC Cox, Amy N, DO   2 Units at 12/19/22 1232   insulin glargine-yfgn (SEMGLEE) injection 14 Units  14 Units Subcutaneous Daily Agbata, Tochukwu, MD   14 Units at 12/19/22 1033   melatonin tablet 5 mg  5 mg Oral QHS PRN Cox, Amy N, DO   5 mg at 12/19/22 0017   methadone (DOLOPHINE) tablet 30 mg  30 mg Oral Daily Barrie Folk, RPH   30 mg at 12/19/22 1034   methocarbamol (ROBAXIN) tablet 500 mg  500 mg Oral TID Lolita Patella B, MD   500 mg at 12/19/22 1034   oxyCODONE (Oxy IR/ROXICODONE) immediate release tablet 5 mg  5 mg Oral Q4H PRN Andris Baumann, MD   5 mg at 12/19/22 0856   polyethylene glycol (MIRALAX / GLYCOLAX) packet 17 g  17 g Oral BID Bari Mantis A, RPH   17 g at 12/18/22 1550   senna-docusate (Senokot-S) tablet 1 tablet  1 tablet Oral QHS PRN Cox, Amy N, DO       senna-docusate (Senokot-S) tablet 1 tablet  1 tablet Oral BID Agbata, Tochukwu, MD   1 tablet at 12/19/22 0021   traZODone (DESYREL) tablet 25 mg  25 mg Oral QHS Agbata, Tochukwu, MD   25 mg at 12/19/22 0021   venlafaxine XR  (EFFEXOR-XR) 24 hr capsule 75 mg  75 mg Oral Q breakfast Agbata, Tochukwu, MD   75 mg at 12/19/22 0856     Discharge Medications: Please see discharge summary for a list of discharge medications.  Relevant Imaging Results:  Relevant Lab Results:   Additional Information SS # [id number]  Dorsel Flinn Seward Carol, RN

## 2022-12-19 NOTE — Progress Notes (Signed)
PROGRESS NOTE    Yesenia Shepard  UEA:[id number] DOB: 1950/08/12 DOA: 12/17/2022 PCP: Wardell Honour, FNP    Brief Narrative:  72 year old female with history of hypertension, neuropathy, insulin-dependent diabetes mellitus, vascular dementia, history of CAD, DVT on Eliquis, depression, with anxiety, who presents to the emergency department for chief concerns of fall with inability to bear weight.   Imaging revealed left minimally displaced intra-articular distal femur fracture.  Also significant degenerative changes to the knee joint.  Elected for nonsurgical management at this point.  Hinged knee immobilizer has been ordered.   Assessment & Plan:   Principal Problem:   Left displaced femoral neck fracture (HCC) Active Problems:   Anxiety and depression   Essential hypertension   HLD (hyperlipidemia)   Type II diabetes mellitus with renal manifestations (HCC)   DVT (deep venous thrombosis) (HCC)   AKI (acute kidney injury) (HCC)  Status post fall Minimally displaced fracture of the distal medial femoral Orthopedic surgery was consulted and Dr. Allena Katz states that the left femoral minimally displaced fracture is non operative and recommends a left knee immobilizer..   Plan: Hinged joint immobilizer has been ordered Patient should be NWB on LLE.  PT evaluation appreciated Multimodal pain control Hopeful to discharge in 24 hours Continue holding Eliquis for today.  Can likely restart in 24 hours   AKI (acute kidney injury) (HCC) Baseline serum creatinine of 0.85.  Was 1.56 on admission Kidney function improving Continue holding Lasix and lisinopril Hold IV fluid for now Recheck creatinine in a.m.    DVT (deep venous thrombosis) (HCC) Continue holding Eliquis.  Recheck creatinine on 6/13.  Resume if hemoglobin stable   Type II diabetes mellitus  Maintain consistent carbohydrate diet Glycemic control with sliding scale insulin Continue long-acting insulin     HLD  (hyperlipidemia) Home atorvastatin 40 mg daily   Essential hypertension Continue amlodipine     DVT prophylaxis: SCD Code Status: Full Family Communication: None Disposition Plan: Status is: Inpatient Remains inpatient appropriate because: Pain control   Level of care: Telemetry Medical  Consultants:  Orthopedics  Procedures:  None  Antimicrobials: None   Subjective: Seen and examined.  Resting in bed.  Endorses significant pain in affect extremity  Objective: Vitals:   12/18/22 2336 12/19/22 0646 12/19/22 0744 12/19/22 1542  BP: 137/64  (!) 154/73 (!) 132/57  Pulse: 80  73 74  Resp: 20  17 16   Temp: 99.9 F (37.7 C)  99.4 F (37.4 C) 99.2 F (37.3 C)  TempSrc:      SpO2: 97%  95% 92%  Weight:      Height:  5\' 5"  (1.651 m)      Intake/Output Summary (Last 24 hours) at 12/19/2022 1608 Last data filed at 12/19/2022 1300 Gross per 24 hour  Intake 620 ml  Output --  Net 620 ml   Filed Weights   12/17/22 1323  Weight: 81.6 kg    Examination:  General exam: Appears calm and comfortable  Respiratory system: Clear to auscultation. Respiratory effort normal. Cardiovascular system: S1-2, RRR, no murmurs, no pedal edema Gastrointestinal system: Soft, NT/ND, normal bowel sounds Central nervous system: Alert and oriented. No focal neurological deficits. Extremities: Left lower extremity tender to touch.  Decreased range of motion Skin: No rashes, lesions or ulcers Psychiatry: Judgement and insight appear normal. Mood & affect appropriate.     Data Reviewed: I have personally reviewed following labs and imaging studies  CBC: Recent Labs  Lab 12/17/22 1643 12/18/22 0752 12/19/22  0451  WBC 8.7 8.5 9.0  NEUTROABS 6.0  --   --   HGB 10.4* 10.1* 10.2*  HCT 33.4* 32.9* 33.7*  MCV 90.5 90.4 93.4  PLT 310 312 284   Basic Metabolic Panel: Recent Labs  Lab 12/17/22 1643 12/18/22 0752  NA 141 139  K 4.0 4.1  CL 109 108  CO2 23 22  GLUCOSE 108* 114*   BUN 23 20  CREATININE 1.56* 1.29*  CALCIUM 7.9* 8.5*   GFR: Estimated Creatinine Clearance: 41.6 mL/min (A) (by C-G formula based on SCr of 1.29 mg/dL (H)). Liver Function Tests: No results for input(s): "AST", "ALT", "ALKPHOS", "BILITOT", "PROT", "ALBUMIN" in the last 168 hours. No results for input(s): "LIPASE", "AMYLASE" in the last 168 hours. No results for input(s): "AMMONIA" in the last 168 hours. Coagulation Profile: No results for input(s): "INR", "PROTIME" in the last 168 hours. Cardiac Enzymes: No results for input(s): "CKTOTAL", "CKMB", "CKMBINDEX", "TROPONINI" in the last 168 hours. BNP (last 3 results) No results for input(s): "PROBNP" in the last 8760 hours. HbA1C: Recent Labs    12/18/22 0752  HGBA1C 7.2*   CBG: Recent Labs  Lab 12/18/22 1148 12/18/22 1631 12/18/22 2033 12/19/22 0737 12/19/22 1224  GLUCAP 166* 107* 98 132* 175*   Lipid Profile: No results for input(s): "CHOL", "HDL", "LDLCALC", "TRIG", "CHOLHDL", "LDLDIRECT" in the last 72 hours. Thyroid Function Tests: No results for input(s): "TSH", "T4TOTAL", "FREET4", "T3FREE", "THYROIDAB" in the last 72 hours. Anemia Panel: No results for input(s): "VITAMINB12", "FOLATE", "FERRITIN", "TIBC", "IRON", "RETICCTPCT" in the last 72 hours. Sepsis Labs: No results for input(s): "PROCALCITON", "LATICACIDVEN" in the last 168 hours.  No results found for this or any previous visit (from the past 240 hour(s)).       Radiology Studies: CT Knee Left Wo Contrast  Result Date: 12/17/2022 CLINICAL DATA:  Minimally displaced fracture of the distal medial femoral condyle extending into the intercondylar notch on radiographs earlier today. Associated lipohemarthrosis. EXAM: CT OF THE LEFT KNEE WITHOUT CONTRAST TECHNIQUE: Multidetector CT imaging of the left knee was performed according to the standard protocol. Multiplanar CT image reconstructions were also generated. RADIATION DOSE REDUCTION: This exam was  performed according to the departmental dose-optimization program which includes automated exposure control, adjustment of the mA and/or kV according to patient size and/or use of iterative reconstruction technique. COMPARISON:  Left femur radiographs obtained earlier today. FINDINGS: Bones/Joint/Cartilage Previously described medial condyle fracture extending from the posterior aspect of the condyle proximally with lateral condyle and intercondylar components. The intercondylar component extends distally into the distal intercondylar joint space. The medial component extends through the anterolateral aspect of the medial femoral condyle. Mild impaction/displacement of that component of the fracture. Motion artifacts in the proximal tibia simulating a fracture on the coronal reconstruction images with no true fracture identified. Severe lateral joint space narrowing and associated spur formation. Mild medial and patellofemoral spur formation. Ligaments Suboptimally assessed by CT. Muscles and Tendons No abnormality visualized. Soft tissues Medial subcutaneous edema in the medial leg with a crescent of bladder fluid extending along the medial aspect of the fascia. Moderate-sized hemorrhagic effusion. IMPRESSION: 1. Mildly displaced and impacted fracture of the medial aspect of the medial femoral condyle. 2. Intercondylar fracture with articular involvement. 3. Moderate-sized hemorrhagic effusion. 4. Severe lateral compartment and mild medial and patellofemoral compartment osteoarthritis. Electronically Signed   By: Beckie Salts M.D.   On: 12/17/2022 17:15        Scheduled Meds:  amLODipine  5 mg  Oral Daily   ARIPiprazole  10 mg Oral QHS   atorvastatin  20 mg Oral QPM   famotidine  20 mg Oral Daily   ferrous sulfate  325 mg Oral BID   heparin  5,000 Units Subcutaneous Q8H   insulin aspart  0-5 Units Subcutaneous QHS   insulin aspart  0-9 Units Subcutaneous TID WC   insulin glargine-yfgn  14 Units  Subcutaneous Daily   methadone  30 mg Oral Daily   methocarbamol  500 mg Oral TID   polyethylene glycol  17 g Oral BID   senna-docusate  1 tablet Oral BID   traZODone  25 mg Oral QHS   venlafaxine XR  75 mg Oral Q breakfast   Continuous Infusions:   LOS: 2 days     Tresa Moore, MD [facility]   If 7PM-7AM, please contact night-coverage  12/19/2022, 4:08 PM

## 2022-12-19 NOTE — Progress Notes (Signed)
Orthopedic Tech Progress Note Patient Details:  Yesenia Shepard 1950-09-08 [id number]  Called in order to [employer] for a BLEDSOE BRACE LOCKED for this patient   Patient ID: [name], female   DOB: 10/02/1950, 72 y.o.   MRN: [id number]  [name] 12/19/2022, 1:22 PM

## 2022-12-19 NOTE — Evaluation (Signed)
Physical Therapy Evaluation Patient Details Name: Yesenia Shepard MRN: [id number] DOB: 03/15/1951 Today's Date: 12/19/2022  History of Present Illness  Pt is a 72 year old female who presents with L knee pain after a fall, imaging revealed L distal femur fx, L knee immobilizer placed. PMH: HTN, HLD, DM, GERD, depression, anxiety, UTI, PVD, panic attack, chronic pain syndrome on methadone, former smoker, HCV, liver cirrhosis, CKD4, DVT on eliquis  Clinical Impression  Pt is upright in bed and A&Ox4. Pt reports 10/10 L knee pain at rest, worsened with mobility. PTA pt was nonambulatory, used w/c at baseline, able to perform SPT inconsistently, performing ADL's modI, meals were brought to her. Pt was able to perform bed mobility with maxAx2 for physical assistance managing LLE NWB and trunk control. Pt compliant with exercises in bed. Pt left upright in bed, needs within reach, and alarm set. Pt would benefit from skilled PT services to improve bed mobility, managing pain, and functional activity tolerance.      Recommendations for follow up therapy are one component of a multi-disciplinary discharge planning process, led by the attending physician.  Recommendations may be updated based on patient status, additional functional criteria and insurance authorization.  Follow Up Recommendations       Assistance Recommended at Discharge Frequent or constant Supervision/Assistance  Patient can return home with the following  Two people to help with bathing/dressing/bathroom;Two people to help with walking and/or transfers;Assistance with cooking/housework;Assist for transportation;Help with stairs or ramp for entrance;Direct supervision/assist for medications management    Equipment Recommendations None recommended by PT  Recommendations for Other Services       Functional Status Assessment Patient has had a recent decline in their functional status and demonstrates the ability to make significant  improvements in function in a reasonable and predictable amount of time.     Precautions / Restrictions Precautions Precautions: Fall Required Braces or Orthoses: Knee Immobilizer - Left Restrictions Weight Bearing Restrictions: Yes LLE Weight Bearing: Non weight bearing      Mobility  Bed Mobility Overal bed mobility: Needs Assistance Bed Mobility: Supine to Sit     Supine to sit: HOB elevated, +2 for physical assistance, Max assist     General bed mobility comments: for LE negotiation and trunk control    Transfers                        Ambulation/Gait                  Stairs            Wheelchair Mobility    Modified Rankin (Stroke Patients Only)       Balance Overall balance assessment: Needs assistance Sitting-balance support: Single extremity supported, Feet supported Sitting balance-Leahy Scale: Fair Sitting balance - Comments: LLE supported by PT, pt holding onto bedrail                                     Pertinent Vitals/Pain Pain Assessment Pain Assessment: 0-10 Pain Score: 10-Worst pain ever Pain Intervention(s): Limited activity within patient's tolerance, Monitored during session, Repositioned, Premedicated before session    Home Living Family/patient expects to be discharged to:: Other (Comment) (LTC at [facility]) Living Arrangements: Other (Comment) (has one roommate at facility) Available Help at Discharge: Available 24 hours/day Type of Home: Other(Comment) (LTC at [facility])  Home Equipment: Wheelchair - manual      Prior Function Prior Level of Function : Needs assist       Physical Assist : ADLs (physical);Mobility (physical) Mobility (physical): Transfers ADLs (physical): Toileting;Dressing Mobility Comments: Pt is w/c bound, able to SPT to w/c or bsc from bed at baseline ADLs Comments: Needs help with meals     Hand Dominance   Dominant Hand: Right    Extremity/Trunk  Assessment   Upper Extremity Assessment Upper Extremity Assessment: Generalized weakness    Lower Extremity Assessment Lower Extremity Assessment: Generalized weakness       Communication   Communication: No difficulties  Cognition Arousal/Alertness: Awake/alert Behavior During Therapy: WFL for tasks assessed/performed Overall Cognitive Status: Within Functional Limits for tasks assessed                                          General Comments      Exercises Total Joint Exercises Ankle Circles/Pumps: AROM, Both, 10 reps, Supine Quad Sets: AROM, Right, 10 reps, Supine Gluteal Sets: AROM, Right, 10 reps, Supine Heel Slides: AROM, Right, 10 reps, Supine   Assessment/Plan    PT Assessment Patient needs continued PT services  PT Problem List Decreased strength;Decreased range of motion;Decreased activity tolerance;Decreased safety awareness;Decreased balance;Decreased knowledge of precautions;Decreased mobility;Decreased coordination;Pain;Decreased knowledge of use of DME;Decreased cognition       PT Treatment Interventions Balance training;Gait training;Neuromuscular re-education;DME instruction;Stair training;Functional mobility training;Patient/family education;Therapeutic activities;Therapeutic exercise;Wheelchair mobility training    PT Goals (Current goals can be found in the Care Plan section)  Acute Rehab PT Goals Patient Stated Goal: to return to LTC facility PT Goal Formulation: With patient Time For Goal Achievement: 01/02/23 Potential to Achieve Goals: Good    Frequency Min 3X/week     Co-evaluation               AM-PAC PT "6 Clicks" Mobility  Outcome Measure Help needed turning from your back to your side while in a flat bed without using bedrails?: Total Help needed moving from lying on your back to sitting on the side of a flat bed without using bedrails?: Total Help needed moving to and from a bed to a chair (including a  wheelchair)?: Total Help needed standing up from a chair using your arms (e.g., wheelchair or bedside chair)?: Total Help needed to walk in hospital room?: Total Help needed climbing 3-5 steps with a railing? : Total 6 Click Score: 6    End of Session Equipment Utilized During Treatment: Left knee immobilizer Activity Tolerance: Patient limited by pain Patient left: in bed;with bed alarm set;with call bell/phone within reach;with SCD's reapplied Nurse Communication: Mobility status PT Visit Diagnosis: History of falling (Z91.81);Muscle weakness (generalized) (M62.81);Pain Pain - Right/Left: Left Pain - part of body: Knee    Time: 1610-9604 PT Time Calculation (min) (ACUTE ONLY): 21 min   Charges:             Lala Lund, PT, SPT  11:58 AM,12/19/22

## 2022-12-20 DIAGNOSIS — S72002A Fracture of unspecified part of neck of left femur, initial encounter for closed fracture: Secondary | ICD-10-CM | POA: Diagnosis not present

## 2022-12-20 LAB — CBC WITH DIFFERENTIAL/PLATELET
Abs Immature Granulocytes: 0.02 10*3/uL (ref 0.00–0.07)
Basophils Absolute: 0.1 10*3/uL (ref 0.0–0.1)
Basophils Relative: 1 %
Eosinophils Absolute: 0.2 10*3/uL (ref 0.0–0.5)
Eosinophils Relative: 2 %
HCT: 30.5 % — ABNORMAL LOW (ref 36.0–46.0)
Hemoglobin: 10 g/dL — ABNORMAL LOW (ref 12.0–15.0)
Immature Granulocytes: 0 %
Lymphocytes Relative: 23 %
Lymphs Abs: 2 10*3/uL (ref 0.7–4.0)
MCH: 28.8 pg (ref 26.0–34.0)
MCHC: 32.8 g/dL (ref 30.0–36.0)
MCV: 87.9 fL (ref 80.0–100.0)
Monocytes Absolute: 0.7 10*3/uL (ref 0.1–1.0)
Monocytes Relative: 8 %
Neutro Abs: 5.9 10*3/uL (ref 1.7–7.7)
Neutrophils Relative %: 66 %
Platelets: 329 10*3/uL (ref 150–400)
RBC: 3.47 MIL/uL — ABNORMAL LOW (ref 3.87–5.11)
RDW: 12.7 % (ref 11.5–15.5)
WBC: 8.7 10*3/uL (ref 4.0–10.5)
nRBC: 0 % (ref 0.0–0.2)

## 2022-12-20 LAB — BASIC METABOLIC PANEL
Anion gap: 8 (ref 5–15)
BUN: 18 mg/dL (ref 8–23)
CO2: 23 mmol/L (ref 22–32)
Calcium: 8.6 mg/dL — ABNORMAL LOW (ref 8.9–10.3)
Chloride: 108 mmol/L (ref 98–111)
Creatinine, Ser: 1.29 mg/dL — ABNORMAL HIGH (ref 0.44–1.00)
GFR, Estimated: 44 mL/min — ABNORMAL LOW (ref >60)
Glucose, Bld: 112 mg/dL — ABNORMAL HIGH (ref 70–99)
Potassium: 3.8 mmol/L (ref 3.5–5.1)
Sodium: 139 mmol/L (ref 135–145)

## 2022-12-20 LAB — GLUCOSE, CAPILLARY
Glucose-Capillary: 116 mg/dL — ABNORMAL HIGH (ref 70–99)
Glucose-Capillary: 137 mg/dL — ABNORMAL HIGH (ref 70–99)

## 2022-12-20 MED ORDER — APIXABAN 2.5 MG PO TABS
2.5000 mg | ORAL_TABLET | Freq: Two times a day (BID) | ORAL | Status: DC
  Administered 2022-12-20: 2.5 mg via ORAL
  Filled 2022-12-20: qty 1

## 2022-12-20 MED ORDER — GABAPENTIN 100 MG PO CAPS
100.0000 mg | ORAL_CAPSULE | Freq: Two times a day (BID) | ORAL | Status: DC
  Administered 2022-12-20: 100 mg via ORAL
  Filled 2022-12-20: qty 1

## 2022-12-20 MED ORDER — METHOCARBAMOL 500 MG PO TABS
750.0000 mg | ORAL_TABLET | Freq: Four times a day (QID) | ORAL | Status: DC
  Administered 2022-12-20: 750 mg via ORAL
  Filled 2022-12-20: qty 2

## 2022-12-20 MED ORDER — METHOCARBAMOL 750 MG PO TABS: 750.0000 mg | ORAL_TABLET | Freq: Four times a day (QID) | ORAL | Status: AC

## 2022-12-20 MED ORDER — POLYVINYL ALCOHOL 1.4 % OP SOLN
1.0000 [drp] | Freq: Every day | OPHTHALMIC | Status: DC
  Filled 2022-12-20: qty 15

## 2022-12-20 MED ORDER — MELATONIN 5 MG PO TABS: 10.0000 mg | ORAL_TABLET | Freq: Every day | ORAL | Status: DC

## 2022-12-20 MED ORDER — METHADONE HCL 10 MG/ML PO CONC: 30.0000 mg | Freq: Every morning | ORAL | 0 refills | Status: AC

## 2022-12-20 MED ORDER — OXYCODONE HCL 5 MG PO TABS: 5.0000 mg | ORAL_TABLET | Freq: Four times a day (QID) | ORAL | 0 refills | Status: AC | PRN

## 2022-12-20 MED ORDER — OLOPATADINE HCL 0.7 % OP SOLN: 1.0000 [drp] | Freq: Every day | OPHTHALMIC | Status: DC

## 2022-12-20 MED ORDER — OLOPATADINE HCL 0.1 % OP SOLN
1.0000 [drp] | Freq: Two times a day (BID) | OPHTHALMIC | Status: DC
  Administered 2022-12-20: 1 [drp] via OPHTHALMIC
  Filled 2022-12-20: qty 5

## 2022-12-20 NOTE — Discharge Summary (Addendum)
Physician Discharge Summary  Yesenia Shepard:[id number] DOB: Aug 10, 1950 DOA: 12/17/2022  PCP: Wardell Honour, FNP  Admit date: 12/17/2022 Discharge date: 12/20/2022  Admitted From: LTC Disposition:  LTC ([facility])  Recommendations for Outpatient Follow-up:  Follow up with PCP in 1-2 weeks Follow up with [facility] orthopedics in 2 weeks  Home Health:No  Equipment/Devices:Knee hinge brace   Discharge Condition:Stable  CODE STATUS:FULL  Diet recommendation: Heart/carb  Brief/Interim Summary:  72 year old female with history of hypertension, neuropathy, insulin-dependent diabetes mellitus, vascular dementia, history of CAD, DVT on Eliquis, depression, with anxiety, who presents to the emergency department for chief concerns of fall with inability to bear weight.    Imaging revealed left minimally displaced intra-articular distal femur fracture.  Also significant degenerative changes to the knee joint.  Elected for nonsurgical management at this point.  Hinged knee immobilizer has been ordered.   Patient still endorsing some pain in the affected extremity but overall stable.  Patient is on chronic narcotics for pain control will be challenging.  I suspect her recovery will be rather prolonged.  At time of discharge will recommend resumption of home methadone, added small amount of as needed oxycodone with refills to be considered per SNF MD.  Also added muscle relaxants.  Stable for return to [facility].  Follow-up outpatient PCP.  Follow-up outpatient [facility] orthopedics in 2 weeks.   Discharge Diagnoses:  Principal Problem:   Left displaced femoral neck fracture (HCC) Active Problems:   Anxiety and depression   Essential hypertension   HLD (hyperlipidemia)   Type II diabetes mellitus with renal manifestations (HCC)   DVT (deep venous thrombosis) (HCC)   AKI (acute kidney injury) (HCC)  Status post fall Minimally displaced fracture of the distal medial  femoral Orthopedic surgery was consulted and Dr. Allena Katz states that the left femoral minimally displaced fracture is non operative and recommends a left knee immobilizer..   Plan: Hinged joint immobilizer to be placed on left lower extremity at time of discharge.  Stable for return to long-term care.  Okay to resume Eliquis.   AKI (acute kidney injury) (HCC) Baseline serum creatinine of 0.85.  Was 1.56 on admission Kidney function improving Improved at time of discharge.  Okay to resume home lisinopril and Lasix.  Suggest PCP follow-up in 1 week for repeat lab work.  Discharge Instructions  Discharge Instructions     Diet - low sodium heart healthy   Complete by: As directed    Increase activity slowly   Complete by: As directed       Allergies as of 12/20/2022   No Known Allergies      Medication List     STOP taking these medications    cholecalciferol 25 MCG (1000 UNIT) tablet Commonly known as: VITAMIN D3   ferrous sulfate 325 (65 FE) MG tablet   insulin detemir 100 UNIT/ML FlexPen Commonly known as: LEVEMIR   lisinopril 10 MG tablet Commonly known as: ZESTRIL   tamsulosin 0.4 MG Caps capsule Commonly known as: FLOMAX       TAKE these medications    acetaminophen 325 MG tablet Commonly known as: TYLENOL Take 2 tablets (650 mg total) by mouth every 6 (six) hours as needed for mild pain (or Fever >/= 101).   amLODipine 5 MG tablet Commonly known as: NORVASC Take 5 mg by mouth daily.   apixaban 2.5 MG Tabs tablet Commonly known as: ELIQUIS Take 2.5 mg by mouth 2 (two) times daily.   ARIPiprazole 10  MG tablet Commonly known as: ABILIFY Take 10 mg by mouth at bedtime.   ascorbic acid 500 MG tablet Commonly known as: VITAMIN C Take 500 mg by mouth daily.   atorvastatin 40 MG tablet Commonly known as: LIPITOR Take 1 tablet (40 mg total) by mouth daily. What changed:  how much to take when to take this   cetirizine 5 MG tablet Commonly known as:  ZYRTEC Take 5 mg by mouth daily.   desvenlafaxine 50 MG 24 hr tablet Commonly known as: PRISTIQ Take 50 mg by mouth in the morning.   famotidine 20 MG tablet Commonly known as: PEPCID Take 1 tablet (20 mg total) by mouth daily.   feeding supplement (GLUCERNA SHAKE) Liqd Take 237 mLs by mouth 2 (two) times daily between meals.   furosemide 20 MG tablet Commonly known as: LASIX Take 20 mg by mouth daily.   gabapentin 100 MG capsule Commonly known as: NEURONTIN Take 1 capsule (100 mg total) by mouth every 12 (twelve) hours.   hydroxypropyl methylcellulose / hypromellose 2.5 % ophthalmic solution Commonly known as: ISOPTO TEARS / GONIOVISC Place 1 drop into both eyes at bedtime.   insulin glargine 100 UNIT/ML injection Commonly known as: LANTUS Inject 14 Units into the skin daily.   insulin lispro 100 UNIT/ML KwikPen Commonly known as: HUMALOG Inject 0-12 Units into the skin See admin instructions. Inject subcutaneously before meals per sliding scale. If BS 100-150; 2 units, 201-250; 4 units, 251-300; 6 units, 301-350; 8 units, 351-400; 10 units, 401-450; 12 units. If greater than 450 call MD.   Melatonin 10 MG Tabs Take 10 mg by mouth at bedtime.   metFORMIN 500 MG tablet Commonly known as: GLUCOPHAGE Take 500 mg by mouth daily with breakfast.   methadone 10 MG/ML solution Commonly known as: DOLOPHINE Take 3 mLs (30 mg total) by mouth in the morning for 3 days. SNF/LTC use only.  Chronic medication prescribed for resumption upon return to long term care facility What changed: additional instructions   methocarbamol 750 MG tablet Commonly known as: ROBAXIN Take 1 tablet (750 mg total) by mouth 4 (four) times daily.   MIRALAX PO Take 17 g by mouth 2 (two) times daily. Mix in 4 to 8 oz of fluid of choice.   oxyCODONE 5 MG immediate release tablet Commonly known as: Oxy IR/ROXICODONE Take 1 tablet (5 mg total) by mouth every 6 (six) hours as needed for up to 3 days for  moderate pain, severe pain or breakthrough pain. SNF use only.  Refills to be considered per SNF MD   Pataday 0.7 % Soln Generic drug: Olopatadine HCl Place 1 drop into both eyes daily at 6 (six) AM.   senna-docusate 8.6-50 MG tablet Commonly known as: Senokot-S Take 1 tablet by mouth 2 (two) times daily.   traZODone 50 MG tablet Commonly known as: DESYREL Take 25 mg by mouth at bedtime.        Follow-up Information     Wardell Honour, FNP. Schedule an appointment as soon as possible for a visit in 1 week(s).   Specialty: Family Medicine Contact information: [address] [place] [address] [phone]         Signa Kell, MD. Schedule an appointment as soon as possible for a visit in 2 week(s).   Specialty: Orthopedic Surgery Contact information: [address] [place] [address] [phone]                No Known Allergies  Consultations: Orthopedics   Procedures/Studies: CT Knee Left Wo Contrast  Result Date: 12/17/2022 CLINICAL DATA:  Minimally displaced fracture of the distal medial femoral condyle extending into the intercondylar notch on radiographs earlier today. Associated lipohemarthrosis. EXAM: CT OF THE LEFT KNEE WITHOUT CONTRAST TECHNIQUE: Multidetector CT imaging of the left knee was performed according to the standard protocol. Multiplanar CT image reconstructions were also generated. RADIATION DOSE REDUCTION: This exam was performed according to the departmental dose-optimization program which includes automated exposure control, adjustment of the mA and/or kV according to patient size and/or use of iterative reconstruction technique. COMPARISON:  Left femur radiographs obtained earlier today. FINDINGS: Bones/Joint/Cartilage Previously described medial condyle fracture extending from the posterior aspect of the condyle proximally with lateral condyle and intercondylar components. The intercondylar component extends distally into the  distal intercondylar joint space. The medial component extends through the anterolateral aspect of the medial femoral condyle. Mild impaction/displacement of that component of the fracture. Motion artifacts in the proximal tibia simulating a fracture on the coronal reconstruction images with no true fracture identified. Severe lateral joint space narrowing and associated spur formation. Mild medial and patellofemoral spur formation. Ligaments Suboptimally assessed by CT. Muscles and Tendons No abnormality visualized. Soft tissues Medial subcutaneous edema in the medial leg with a crescent of bladder fluid extending along the medial aspect of the fascia. Moderate-sized hemorrhagic effusion. IMPRESSION: 1. Mildly displaced and impacted fracture of the medial aspect of the medial femoral condyle. 2. Intercondylar fracture with articular involvement. 3. Moderate-sized hemorrhagic effusion. 4. Severe lateral compartment and mild medial and patellofemoral compartment osteoarthritis. Electronically Signed   By: Beckie Salts M.D.   On: 12/17/2022 17:15   CT HEAD WO CONTRAST ( )  Result Date: 12/17/2022 CLINICAL DATA:  Head trauma, minor (Age >= 65y) on thinners EXAM: CT HEAD WITHOUT CONTRAST TECHNIQUE: Contiguous axial images were obtained from the base of the skull through the vertex without intravenous contrast. RADIATION DOSE REDUCTION: This exam was performed according to the departmental dose-optimization program which includes automated exposure control, adjustment of the mA and/or kV according to patient size and/or use of iterative reconstruction technique. COMPARISON:  CT Head 03/31/22 FINDINGS: Brain: No evidence of acute infarction, hemorrhage, hydrocephalus, extra-axial collection or mass lesion/mass effect. Vascular: No hyperdense vessel or unexpected calcification. Skull: Normal. Negative for fracture or focal lesion. Sinuses/Orbits: No middle ear or mastoid effusion. Paranasal sinuses are notable for  frothy secretions in the left sphenoid sinus. Orbits are notable for left lens replacement, otherwise unremarkable. Other: None. IMPRESSION: No acute intracranial abnormality. Electronically Signed   By: Lorenza Cambridge M.D.   On: 12/17/2022 14:39   DG Chest 1 View  Result Date: 12/17/2022 CLINICAL DATA:  Pain. Status post fall last night. X-rays at facility demonstrated distal femur fracture. EXAM: CHEST  1 VIEW COMPARISON:  Chest radiograph 08/31/2021 FINDINGS: Stable heart size and mediastinal contours. Aortic atherosclerosis. Coarse lung markings which appear chronic. No focal airspace disease, pleural effusion or pneumothorax. Remote right clavicle fracture. Postsurgical change of the right proximal humerus. On limited assessment, no acute osseous findings. IMPRESSION: No acute abnormality. Electronically Signed   By: Narda Rutherford M.D.   On: 12/17/2022 14:34   DG Tibia/Fibula Left  Result Date: 12/17/2022 CLINICAL DATA:  Status post fall last night. X-rays at facility demonstrated distal femur fracture. EXAM: LEFT TIBIA AND FIBULA - 2 VIEW COMPARISON:  None Available. FINDINGS: Fracture of the distal medial femur extends into the intercondylar notch. Possible involvement of the lateral cortex. No  fracture of the tibia or fibula. No dislocation of knee or ankle. There is a knee lipohemarthrosis. Generalized soft tissue edema. IMPRESSION: 1. Fracture of the distal medial femur extends into the intercondylar notch. Possible involvement of the lateral cortex. 2. No fracture of the tibia or fibula. Electronically Signed   By: Narda Rutherford M.D.   On: 12/17/2022 14:33   DG FEMUR MIN 2 VIEWS LEFT  Result Date: 12/17/2022 CLINICAL DATA:  Status post fall last night. X-rays at facility demonstrated distal femur fracture. EXAM: LEFT FEMUR 2 VIEWS COMPARISON:  None Available. FINDINGS: The bones are under mineralized. Minimally displaced fracture of the distal medial femoral condyle. On concurrent tibia  exam, this extends to the intercondylar notch. Lipohemarthrosis of the knee better demonstrated on concurrent tibia exam. The proximal femur is intact. Vascular stent in place. IMPRESSION: 1. Minimally displaced fracture of the distal medial femoral condyle, extending to the intercondylar notch. 2. Lipohemarthrosis of the knee better demonstrated on concurrent tibia exam. Electronically Signed   By: Narda Rutherford M.D.   On: 12/17/2022 14:32   DG Pelvis 1-2 Views  Result Date: 12/17/2022 CLINICAL DATA:  Status post fall last night. X-rays at facility demonstrated distal femur fracture. EXAM: PELVIS - 1-2 VIEW COMPARISON:  None Available. FINDINGS: The cortical margins of the bony pelvis are intact. No fracture. Pubic symphysis and sacroiliac joints are congruent. Both femoral heads are well-seated in the respective acetabula. Stool distends the rectum. IMPRESSION: No pelvic fracture. Electronically Signed   By: Narda Rutherford M.D.   On: 12/17/2022 14:30      Subjective: Seen and examined on the day of discharge.  Still in some pain but overall stable.  Appropriate for return to skilled nursing facility  Discharge Exam: Vitals:   12/20/22 0419 12/20/22 0745  BP: (!) 151/72 (!) 154/65  Pulse: 75 79  Resp: 18 16  Temp: 98.4 F (36.9 C) 98.3 F (36.8 C)  SpO2: 96% 93%   Vitals:   12/19/22 2033 12/19/22 2326 12/20/22 0419 12/20/22 0745  BP: (!) 141/63 (!) 142/68 (!) 151/72 (!) 154/65  Pulse: 78 79 75 79  Resp: 18 18 18 16   Temp: 98.3 F (36.8 C) 99.1 F (37.3 C) 98.4 F (36.9 C) 98.3 F (36.8 C)  TempSrc:  Oral    SpO2: 95% 94% 96% 93%  Weight:      Height:        General: Pt is alert, awake, not in acute distress Cardiovascular: RRR, S1/S2 +, no rubs, no gallops Respiratory: CTA bilaterally, no wheezing, no rhonchi Abdominal: Soft, NT, ND, bowel sounds + Extremities: Left lower extremity tender to touch.  Decreased ROM    The results of significant diagnostics from this  hospitalization (including imaging, microbiology, ancillary and laboratory) are listed below for reference.     Microbiology: No results found for this or any previous visit (from the past 240 hour(s)).   Labs: BNP (last 3 results) No results for input(s): "BNP" in the last 8760 hours. Basic Metabolic Panel: Recent Labs  Lab 12/17/22 1643 12/18/22 0752 12/20/22 0449  NA 141 139 139  K 4.0 4.1 3.8  CL 109 108 108  CO2 23 22 23   GLUCOSE 108* 114* 112*  BUN 23 20 18   CREATININE 1.56* 1.29* 1.29*  CALCIUM 7.9* 8.5* 8.6*   Liver Function Tests: No results for input(s): "AST", "ALT", "ALKPHOS", "BILITOT", "PROT", "ALBUMIN" in the last 168 hours. No results for input(s): "LIPASE", "AMYLASE" in the last 168 hours.  No results for input(s): "AMMONIA" in the last 168 hours. CBC: Recent Labs  Lab 12/17/22 1643 12/18/22 0752 12/19/22 0451 12/20/22 0449  WBC 8.7 8.5 9.0 8.7  NEUTROABS 6.0  --   --  5.9  HGB 10.4* 10.1* 10.2* 10.0*  HCT 33.4* 32.9* 33.7* 30.5*  MCV 90.5 90.4 93.4 87.9  PLT 310 312 284 329   Cardiac Enzymes: No results for input(s): "CKTOTAL", "CKMB", "CKMBINDEX", "TROPONINI" in the last 168 hours. BNP: Invalid input(s): "POCBNP" CBG: Recent Labs  Lab 12/19/22 1224 12/19/22 1713 12/19/22 2113 12/20/22 0744 12/20/22 1121  GLUCAP 175* 144* 74 116* 137*   D-Dimer No results for input(s): "DDIMER" in the last 72 hours. Hgb A1c Recent Labs    12/18/22 0752  HGBA1C 7.2*   Lipid Profile No results for input(s): "CHOL", "HDL", "LDLCALC", "TRIG", "CHOLHDL", "LDLDIRECT" in the last 72 hours. Thyroid function studies No results for input(s): "TSH", "T4TOTAL", "T3FREE", "THYROIDAB" in the last 72 hours.  Invalid input(s): "FREET3" Anemia work up No results for input(s): "VITAMINB12", "FOLATE", "FERRITIN", "TIBC", "IRON", "RETICCTPCT" in the last 72 hours. Urinalysis    Component Value Date/Time   COLORURINE YELLOW (A) 08/31/2021 1201   APPEARANCEUR  HAZY (A) 08/31/2021 1201   LABSPEC 1.020 08/31/2021 1201   PHURINE 5.0 08/31/2021 1201   GLUCOSEU NEGATIVE 08/31/2021 1201   HGBUR MODERATE (A) 08/31/2021 1201   BILIRUBINUR NEGATIVE 08/31/2021 1201   BILIRUBINUR negative 01/14/2017 1603   KETONESUR 20 (A) 08/31/2021 1201   PROTEINUR 30 (A) 08/31/2021 1201   UROBILINOGEN 1.0 01/14/2017 1603   NITRITE POSITIVE (A) 08/31/2021 1201   LEUKOCYTESUR LARGE (A) 08/31/2021 1201   Sepsis Labs Recent Labs  Lab 12/17/22 1643 12/18/22 0752 12/19/22 0451 12/20/22 0449  WBC 8.7 8.5 9.0 8.7   Microbiology No results found for this or any previous visit (from the past 240 hour(s)).   Time coordinating discharge: Over 30 minutes  SIGNED:   Tresa Moore, MD  [facility] 12/20/2022, 2:19 PM Pager   If 7PM-7AM, please contact night-coverage

## 2022-12-20 NOTE — Plan of Care (Signed)

## 2022-12-20 NOTE — Progress Notes (Signed)
Physical Therapy Treatment Patient Details Name: Yesenia Shepard MRN: [id number] DOB: Feb 06, 1951 Today's Date: 12/20/2022   History of Present Illness Pt is a 72 year old female who presents with L knee pain after a fall, imaging revealed L distal femur fx, L knee immobilizer placed. PMH: HTN, HLD, DM, GERD, depression, anxiety, UTI, PVD, panic attack, chronic pain syndrome on methadone, former smoker, HCV, liver cirrhosis, CKD4, DVT on eliquis    PT Comments    Pt seen prior to d/c back to LTC facility. Newly delivered hinge brace properly applied to L LE with education on correct donning. Hinge brace locked in ~20 degrees knee flexion due to chronic contracture and inability to tolerate full extension of current Knee Immobilizer. Pt tolerated application and comfortable upon completion.  LTC Facility to adjust settings as needed to maintain comfort and maintain immobility at knee joint until f/u with Ortho.   Recommendations for follow up therapy are one component of a multi-disciplinary discharge planning process, led by the attending physician.  Recommendations may be updated based on patient status, additional functional criteria and insurance authorization.  Follow Up Recommendations  Can patient physically be transported by private vehicle: No    Assistance Recommended at Discharge Frequent or constant Supervision/Assistance  Patient can return home with the following Two people to help with bathing/dressing/bathroom;Two people to help with walking and/or transfers;Assistance with cooking/housework;Assist for transportation;Help with stairs or ramp for entrance;Direct supervision/assist for medications management   Equipment Recommendations  None recommended by PT    Recommendations for Other Services       Precautions / Restrictions Precautions Precautions: Fall Required Braces or Orthoses: Knee Immobilizer - Left (Or hinge brace locked in available knee extension) Knee  Immobilizer - Left: On when out of bed or walking;Other (comment) (off for bathing) Restrictions Weight Bearing Restrictions: Yes LLE Weight Bearing: Non weight bearing     Mobility  Bed Mobility                    Transfers                        Ambulation/Gait                   Stairs             Wheelchair Mobility    Modified Rankin (Stroke Patients Only)       Balance                                            Cognition Arousal/Alertness: Awake/alert Behavior During Therapy: WFL for tasks assessed/performed Overall Cognitive Status: Within Functional Limits for tasks assessed                                          Exercises      General Comments General comments (skin integrity, edema, etc.):  (Application of hinged knee brace prior to d/c back to LTC with education on wear time and proper alignment.)      Pertinent Vitals/Pain Pain Assessment Pain Assessment: 0-10 Pain Score: 6  Pain Location: L knee/leg Pain Descriptors / Indicators: Discomfort, Grimacing, Stabbing Pain Intervention(s): Limited activity within patient's tolerance, Repositioned, Relaxation    Home Living  Prior Function            PT Goals (current goals can now be found in the care plan section) Acute Rehab PT Goals Patient Stated Goal: to return to LTC facility    Frequency    Min 3X/week      PT Plan Current plan remains appropriate    Co-evaluation              AM-PAC PT "6 Clicks" Mobility   Outcome Measure  Help needed turning from your back to your side while in a flat bed without using bedrails?: Total Help needed moving from lying on your back to sitting on the side of a flat bed without using bedrails?: Total Help needed moving to and from a bed to a chair (including a wheelchair)?: Total Help needed standing up from a chair using your arms (e.g.,  wheelchair or bedside chair)?: Total Help needed to walk in hospital room?: Total Help needed climbing 3-5 steps with a railing? : Total 6 Click Score: 6    End of Session Equipment Utilized During Treatment: Left knee immobilizer Activity Tolerance: Patient limited by pain Patient left: in bed;with bed alarm set;with call bell/phone within reach;with nursing/sitter in room Nurse Communication: Precautions;Weight bearing status PT Visit Diagnosis: History of falling (Z91.81);Muscle weakness (generalized) (M62.81);Pain Pain - Right/Left: Left Pain - part of body: Knee     Time: 4098-1191 PT Time Calculation (min) (ACUTE ONLY): 21 min  Charges:  $Therapeutic Activity: 8-22 mins                    Zadie Cleverly, PTA  Jannet Askew 12/20/2022, 3:51 PM

## 2022-12-20 NOTE — TOC Progression Note (Signed)
Transition of Care [facility]) - Progression Note    Patient Details  Name: Yesenia Shepard MRN: [id number] Date of Birth: June 16, 1951  [facility] CM/SW Contact  Marlowe Sax, RN Phone Number: 12/20/2022, 11:50 AM  Clinical Narrative:    Ins approved 6/13-6/17 ref [id number]        Expected Discharge Plan and Services                                               Social Determinants of Health (SDOH) Interventions SDOH Screenings   Food Insecurity: No Food Insecurity (12/17/2022)  Housing: Low Risk  (12/17/2022)  Transportation Needs: No Transportation Needs (12/17/2022)  Utilities: Not At Risk (12/17/2022)  Depression (PHQ2-9): Low Risk  (07/21/2019)  Financial Resource Strain: Low Risk  (09/20/2017)  Physical Activity: Unknown (09/20/2017)  Tobacco Use: Medium Risk (12/19/2022)    Readmission Risk Interventions    09/01/2021    3:02 PM 07/07/2021    9:14 AM  Readmission Risk Prevention Plan  Transportation Screening Complete Complete  PCP or Specialist Appt within 3-5 Days  Complete  Social Work Consult for Recovery Care Planning/Counseling  Complete  Palliative Care Screening  Not Applicable  Medication Review Oceanographer)  Complete

## 2022-12-20 NOTE — TOC Progression Note (Signed)
Transition of Care [facility]) - Progression Note    Patient Details  Name: YARIXA LIGHTCAP MRN: [id number] Date of Birth: Aug 10, 1950  [facility]) CM/SW Contact  Marlowe Sax, RN Phone Number: 12/20/2022, 3:12 PM  Clinical Narrative:     Called Sister Rosey Bath the POA and let her know the patient will return to [facility] today, EMS called to transport,         Expected Discharge Plan and Services         Expected Discharge Date: 12/20/22                                     Social Determinants of Health (SDOH) Interventions SDOH Screenings   Food Insecurity: No Food Insecurity (12/17/2022)  Housing: Low Risk  (12/17/2022)  Transportation Needs: No Transportation Needs (12/17/2022)  Utilities: Not At Risk (12/17/2022)  Depression (PHQ2-9): Low Risk  (07/21/2019)  Financial Resource Strain: Low Risk  (09/20/2017)  Physical Activity: Unknown (09/20/2017)  Tobacco Use: Medium Risk (12/19/2022)    Readmission Risk Interventions    09/01/2021    3:02 PM 07/07/2021    9:14 AM  Readmission Risk Prevention Plan  Transportation Screening Complete Complete  PCP or Specialist Appt within 3-5 Days  Complete  Social Work Consult for Recovery Care Planning/Counseling  Complete  Palliative Care Screening  Not Applicable  Medication Review Oceanographer)  Complete
